# Patient Record
Sex: Female | Born: 1955
Health system: Southern US, Community
[De-identification: ages and names within clinical notes are randomized; demographics above are authoritative.]

## PROBLEM LIST (undated history)

## (undated) ENCOUNTER — Emergency Department (HOSPITAL_COMMUNITY): Admission: EM | Payer: Medicare HMO | Source: Home / Self Care

## (undated) DIAGNOSIS — M542 Cervicalgia: Secondary | ICD-10-CM

## (undated) DIAGNOSIS — H269 Unspecified cataract: Secondary | ICD-10-CM

## (undated) DIAGNOSIS — B1910 Unspecified viral hepatitis B without hepatic coma: Secondary | ICD-10-CM

## (undated) DIAGNOSIS — R9341 Abnormal radiologic findings on diagnostic imaging of renal pelvis, ureter, or bladder: Secondary | ICD-10-CM

## (undated) DIAGNOSIS — G629 Polyneuropathy, unspecified: Secondary | ICD-10-CM

## (undated) DIAGNOSIS — M545 Low back pain, unspecified: Secondary | ICD-10-CM

## (undated) DIAGNOSIS — K219 Gastro-esophageal reflux disease without esophagitis: Secondary | ICD-10-CM

## (undated) DIAGNOSIS — F1911 Other psychoactive substance abuse, in remission: Secondary | ICD-10-CM

## (undated) DIAGNOSIS — J309 Allergic rhinitis, unspecified: Secondary | ICD-10-CM

## (undated) DIAGNOSIS — F419 Anxiety disorder, unspecified: Secondary | ICD-10-CM

## (undated) DIAGNOSIS — E119 Type 2 diabetes mellitus without complications: Secondary | ICD-10-CM

## (undated) DIAGNOSIS — E785 Hyperlipidemia, unspecified: Secondary | ICD-10-CM

## (undated) DIAGNOSIS — R569 Unspecified convulsions: Secondary | ICD-10-CM

## (undated) DIAGNOSIS — M797 Fibromyalgia: Secondary | ICD-10-CM

## (undated) DIAGNOSIS — M1990 Unspecified osteoarthritis, unspecified site: Secondary | ICD-10-CM

## (undated) DIAGNOSIS — F329 Major depressive disorder, single episode, unspecified: Secondary | ICD-10-CM

## (undated) DIAGNOSIS — D649 Anemia, unspecified: Secondary | ICD-10-CM

## (undated) DIAGNOSIS — T7840XA Allergy, unspecified, initial encounter: Secondary | ICD-10-CM

## (undated) DIAGNOSIS — F17200 Nicotine dependence, unspecified, uncomplicated: Secondary | ICD-10-CM

## (undated) DIAGNOSIS — F319 Bipolar disorder, unspecified: Secondary | ICD-10-CM

## (undated) DIAGNOSIS — F1910 Other psychoactive substance abuse, uncomplicated: Secondary | ICD-10-CM

## (undated) DIAGNOSIS — G43909 Migraine, unspecified, not intractable, without status migrainosus: Secondary | ICD-10-CM

## (undated) DIAGNOSIS — E109 Type 1 diabetes mellitus without complications: Secondary | ICD-10-CM

## (undated) HISTORY — DX: Unspecified cataract: H26.9

## (undated) HISTORY — DX: Abnormal radiologic findings on diagnostic imaging of renal pelvis, ureter, or bladder: R93.41

## (undated) HISTORY — DX: Allergic rhinitis, unspecified: J30.9

## (undated) HISTORY — DX: Type 2 diabetes mellitus without complications: E11.9

## (undated) HISTORY — DX: Low back pain, unspecified: M54.50

## (undated) HISTORY — DX: Allergy, unspecified, initial encounter: T78.40XA

## (undated) HISTORY — DX: Major depressive disorder, single episode, unspecified: F32.9

## (undated) HISTORY — DX: Polyneuropathy, unspecified: G62.9

## (undated) HISTORY — DX: Nicotine dependence, unspecified, uncomplicated: F17.200

## (undated) HISTORY — DX: Fibromyalgia: M79.7

## (undated) HISTORY — PX: TUBAL LIGATION: SHX77

## (undated) HISTORY — DX: Anemia, unspecified: D64.9

## (undated) HISTORY — DX: Other psychoactive substance abuse, in remission: F19.11

## (undated) HISTORY — DX: Unspecified osteoarthritis, unspecified site: M19.90

## (undated) HISTORY — DX: Unspecified convulsions: R56.9

## (undated) HISTORY — DX: Hyperlipidemia, unspecified: E78.5

## (undated) HISTORY — DX: Migraine, unspecified, not intractable, without status migrainosus: G43.909

## (undated) HISTORY — DX: Cervicalgia: M54.2

## (undated) HISTORY — PX: COLONOSCOPY: SHX174

## (undated) HISTORY — DX: Low back pain: M54.5

## (undated) HISTORY — DX: Bipolar disorder, unspecified: F31.9

## (undated) HISTORY — DX: Type 1 diabetes mellitus without complications: E10.9

## (undated) HISTORY — DX: Anxiety disorder, unspecified: F41.9

## (undated) HISTORY — DX: Gastro-esophageal reflux disease without esophagitis: K21.9

## (undated) HISTORY — PX: UPPER GASTROINTESTINAL ENDOSCOPY: SHX188

## (undated) HISTORY — DX: Other psychoactive substance abuse, uncomplicated: F19.10

## (undated) HISTORY — DX: Unspecified viral hepatitis B without hepatic coma: B19.10

---

## 1999-02-27 ENCOUNTER — Emergency Department (HOSPITAL_COMMUNITY): Admission: EM | Admit: 1999-02-27 | Discharge: 1999-02-27 | Payer: Self-pay | Admitting: Emergency Medicine

## 2000-05-02 ENCOUNTER — Other Ambulatory Visit: Admission: RE | Admit: 2000-05-02 | Discharge: 2000-05-02 | Payer: Self-pay | Admitting: Obstetrics and Gynecology

## 2000-10-15 ENCOUNTER — Emergency Department (HOSPITAL_COMMUNITY): Admission: EM | Admit: 2000-10-15 | Discharge: 2000-10-15 | Payer: Self-pay | Admitting: Emergency Medicine

## 2000-10-16 ENCOUNTER — Inpatient Hospital Stay (HOSPITAL_COMMUNITY): Admission: EM | Admit: 2000-10-16 | Discharge: 2000-10-21 | Payer: Self-pay | Admitting: *Deleted

## 2000-11-01 ENCOUNTER — Emergency Department (HOSPITAL_COMMUNITY): Admission: EM | Admit: 2000-11-01 | Discharge: 2000-11-01 | Payer: Self-pay | Admitting: Emergency Medicine

## 2001-08-21 ENCOUNTER — Emergency Department (HOSPITAL_COMMUNITY): Admission: EM | Admit: 2001-08-21 | Discharge: 2001-08-21 | Payer: Self-pay | Admitting: *Deleted

## 2002-04-29 ENCOUNTER — Encounter: Payer: Self-pay | Admitting: Endocrinology

## 2002-04-29 ENCOUNTER — Ambulatory Visit (HOSPITAL_COMMUNITY): Admission: RE | Admit: 2002-04-29 | Discharge: 2002-04-29 | Payer: Self-pay | Admitting: Endocrinology

## 2002-09-21 ENCOUNTER — Emergency Department (HOSPITAL_COMMUNITY): Admission: EM | Admit: 2002-09-21 | Discharge: 2002-09-21 | Payer: Self-pay | Admitting: Emergency Medicine

## 2002-09-25 ENCOUNTER — Inpatient Hospital Stay (HOSPITAL_COMMUNITY): Admission: EM | Admit: 2002-09-25 | Discharge: 2002-09-29 | Payer: Self-pay | Admitting: Psychiatry

## 2004-05-11 ENCOUNTER — Emergency Department (HOSPITAL_COMMUNITY): Admission: EM | Admit: 2004-05-11 | Discharge: 2004-05-11 | Payer: Self-pay | Admitting: Emergency Medicine

## 2006-02-08 ENCOUNTER — Emergency Department (HOSPITAL_COMMUNITY): Admission: EM | Admit: 2006-02-08 | Discharge: 2006-02-08 | Payer: Self-pay | Admitting: Family Medicine

## 2007-07-16 ENCOUNTER — Emergency Department (HOSPITAL_COMMUNITY): Admission: EM | Admit: 2007-07-16 | Discharge: 2007-07-16 | Payer: Self-pay | Admitting: Emergency Medicine

## 2009-01-12 ENCOUNTER — Encounter: Admission: RE | Admit: 2009-01-12 | Discharge: 2009-01-12 | Payer: Self-pay | Admitting: Psychiatry

## 2009-05-26 LAB — HM COLONOSCOPY: HM Colonoscopy: NORMAL

## 2010-01-30 ENCOUNTER — Emergency Department (HOSPITAL_COMMUNITY): Admission: EM | Admit: 2010-01-30 | Discharge: 2010-01-31 | Payer: Self-pay | Admitting: Emergency Medicine

## 2010-01-31 DIAGNOSIS — E162 Hypoglycemia, unspecified: Secondary | ICD-10-CM

## 2010-01-31 LAB — CONVERTED CEMR LAB
BUN: 9 mg/dL
Chloride: 109 meq/L
Creatinine, Ser: 0.8 mg/dL
HCT: 39 %
Potassium: 3.6 meq/L

## 2010-07-05 ENCOUNTER — Ambulatory Visit
Admission: RE | Admit: 2010-07-05 | Discharge: 2010-07-05 | Payer: Self-pay | Source: Home / Self Care | Attending: Internal Medicine | Admitting: Internal Medicine

## 2010-07-05 DIAGNOSIS — B1910 Unspecified viral hepatitis B without hepatic coma: Secondary | ICD-10-CM

## 2010-07-05 DIAGNOSIS — M542 Cervicalgia: Secondary | ICD-10-CM | POA: Insufficient documentation

## 2010-07-05 DIAGNOSIS — M545 Low back pain, unspecified: Secondary | ICD-10-CM

## 2010-07-05 DIAGNOSIS — E1069 Type 1 diabetes mellitus with other specified complication: Secondary | ICD-10-CM

## 2010-07-05 DIAGNOSIS — F319 Bipolar disorder, unspecified: Secondary | ICD-10-CM | POA: Insufficient documentation

## 2010-07-05 DIAGNOSIS — J410 Simple chronic bronchitis: Secondary | ICD-10-CM | POA: Insufficient documentation

## 2010-07-05 DIAGNOSIS — K219 Gastro-esophageal reflux disease without esophagitis: Secondary | ICD-10-CM

## 2010-07-05 DIAGNOSIS — E782 Mixed hyperlipidemia: Secondary | ICD-10-CM

## 2010-07-05 DIAGNOSIS — G43909 Migraine, unspecified, not intractable, without status migrainosus: Secondary | ICD-10-CM | POA: Insufficient documentation

## 2010-07-05 DIAGNOSIS — F329 Major depressive disorder, single episode, unspecified: Secondary | ICD-10-CM | POA: Insufficient documentation

## 2010-07-05 DIAGNOSIS — F17200 Nicotine dependence, unspecified, uncomplicated: Secondary | ICD-10-CM | POA: Insufficient documentation

## 2010-07-05 DIAGNOSIS — J309 Allergic rhinitis, unspecified: Secondary | ICD-10-CM | POA: Insufficient documentation

## 2010-07-05 DIAGNOSIS — E119 Type 2 diabetes mellitus without complications: Secondary | ICD-10-CM | POA: Insufficient documentation

## 2010-07-05 HISTORY — DX: Unspecified viral hepatitis B without hepatic coma: B19.10

## 2010-07-05 HISTORY — DX: Low back pain, unspecified: M54.50

## 2010-07-05 HISTORY — DX: Bipolar disorder, unspecified: F31.9

## 2010-07-05 HISTORY — DX: Type 1 diabetes mellitus with other specified complication: E10.69

## 2010-07-05 HISTORY — DX: Simple chronic bronchitis: J41.0

## 2010-07-05 HISTORY — DX: Migraine, unspecified, not intractable, without status migrainosus: G43.909

## 2010-07-05 HISTORY — DX: Gastro-esophageal reflux disease without esophagitis: K21.9

## 2010-08-04 NOTE — Assessment & Plan Note (Signed)
Summary: NEW MEDICARE PT--#--PKG---STC   Vital Signs:  Patient profile:   55 year old female Height:      64.5 inches (163.83 cm) Weight:      141.2 pounds (64.18 kg) BMI:     23.95 O2 Sat:      96 % on Room air Temp:     98.8 degrees F (37.11 degrees C) oral Pulse rate:   64 / minute BP sitting:   110 / 60  (left arm) Cuff size:   regular  Vitals Entered By: Orlan Leavens RMA (July 05, 2010 9:41 AM)  O2 Flow:  Room air CC: New patient Is Patient Diabetic? No Pain Assessment Patient in pain? no      CBG Result 32 CBG Device ID Pt mom check this am   Primary Care Provider:  Newt Lukes MD  CC:  New patient.  History of Present Illness: new pt to me and our practice, here to est care  1) DM2 - insulin dep - follows with endo (kumar) for same - denies hx hypoglycemic signs or symptoms since summer 2011 (ER visit for same reviewed) - checks sugars 2-3x/d  2) depression, severe - hx - follows with psyc and mental health for same - reports compliance with ongoing medical treatment and no changes in medication dose or frequency. denies adverse side effects related to current therapy. no SI/HI  3) migraines - follows with [facility] for same ([facility]) -   4) dyslipidemia - on statin for same since - reports compliance with ongoing medical treatment and no changes in medication dose or frequency. denies adverse side effects related to current therapy.   c/o consant sedation, ?med related  c/o neck and back pain - chronic - aches to sleep and do nothing but worse with any movement ?FM - mom has same no injury or trauma hx onset symptoms >2 years ago no radiation of pain or numbness - not relieved with current meds  Preventive Screening-Counseling & Management  Alcohol-Tobacco     Alcohol drinks/day: <1     Alcohol Counseling: not indicated; use of alcohol is not excessive or problematic     Smoking Status: current     Tobacco Counseling: to quit use of tobacco  products  Caffeine-Diet-Exercise     Does Patient Exercise: yes     Exercise Counseling: to improve exercise regimen     Depression Counseling: not indicated; screening negative for depression  Safety-Violence-Falls     Seat Belt Counseling: not indicated; patient wears seat belts     Helmet Counseling: not indicated; patient wears helmet when riding bicycle/motocycle     Firearm Counseling: not applicable     Smoke Detector Counseling: no     Violence Counseling: not applicable     Fall Risk Counseling: not indicated; no significant falls noted  Clinical Review Panels:  Diabetes Management   Creatinine:  0.8 (01/31/2010)  CBC   Hgb:  13.3 (01/31/2010)   Hct:  39.0 (01/31/2010)  Complete Metabolic Panel   Glucose:  172 (01/31/2010)   Sodium:  134 (01/31/2010)   Potassium:  3.6 (01/31/2010)   Chloride:  109 (01/31/2010)   CO2:  17 (01/31/2010)   BUN:  9 (01/31/2010)   Creatinine:  0.8 (01/31/2010)   -  Date:  01/31/2010    BG Random: 172    BUN: 9    Creatinine: 0.8    Sodium: 134    Potassium: 3.6    Chloride: 109  CO2 Total: 17    HGB: 13.3    HCT: 39.0  Current Medications (verified): 1)  Topiramate 200 Mg Tabs (Topiramate) .... Take 1 By Mouth Once Daily 2)  Trazodone Hcl 50 Mg Tabs (Trazodone Hcl) .... Take 1 At Bedtime 3)  Lamotrigine 25 Mg Tabs (Lamotrigine) .... Take 2 At Bedtime 4)  Simvastatin 20 Mg Tabs (Simvastatin) .... Take 1 By Mouth Once Daily 5)  Bethanechol Chloride 25 Mg Tabs (Bethanechol Chloride) .... Take 1 Two Times A Day 6)  Lexapro 20 Mg Tabs (Escitalopram Oxalate) .... Take 1 1/2 By Mouth Once Daily 7)  Baclofen 10 Mg Tabs (Baclofen) .... Take 1 Three Times A Day As Needed 8)  Nexium 40 Mg Cpdr (Esomeprazole Magnesium) .... Take 1 By Mouth Once Daily 9)  Atenolol 25 Mg Tabs (Atenolol) .... Take 1 By Mouth Once Daily 10)  Gabapentin 100 Mg Caps (Gabapentin) .... Take 2 Three Times A Day 11)  Humalog 100 Unit/ml Soln (Insulin Lispro  (Human)) .... Sliding Scale 12)  Lantus 100 Unit/ml Soln (Insulin Glargine) .... Take 9 Units in Morning, and 5-6 Units At Supper  Allergies (verified): 1)  ! Sulfa 2)  ! Penicillin  Past History:  Past Medical History: Allergic rhinitis Depression/bipolar Hyperlipidemia Diabetes mellitus, type II -insulin dep migraines chronic pain - hx narc abuse GERD  MD roster: endo - k[facility] neuro - [facility] ([facility]) psyc - reddy - mental health  Past Surgical History: Denies surgical history  Family History: Family History of Arthritis (parent) Family History Diabetes 1st degree relative (parent) Family History High cholesterol (parent) Kidney disease (father & aunt)  Social History: Current Smoker- plans to quit 08/2010 divorced, lives with her parents but has boyfriend disabled Smoking Status:  current Does Patient Exercise:  yes  Review of Systems       see HPI above. I have reviewed all other systems and they were negative.   Physical Exam  General:  alert, well-developed, well-nourished, and cooperative to examination.   mom at side Head:  Normocephalic and atraumatic without obvious abnormalities. No apparent alopecia or balding. Eyes:  vision grossly intact; pupils equal, round and reactive to light.  conjunctiva and lids normal.    Ears:  normal pinnae bilaterally, without erythema, swelling, or tenderness to palpation. TMs clear, without effusion, or cerumen impaction. Hearing grossly normal bilaterally  Mouth:  teeth and gums in good repair; mucous membranes moist, without lesions or ulcers. oropharynx clear without exudate, no erythema.  Neck:  supple, full ROM, no masses, no thyromegaly; no thyroid nodules or tenderness. no JVD or carotid bruits.   Lungs:  normal respiratory effort, no intercostal retractions or use of accessory muscles; normal breath sounds bilaterally - no crackles and no wheezes.    Heart:  normal rate, regular rhythm, no murmur, and no rub. BLE  without edema.  Msk:  back: full range of motion of lumbar spine. Nontender to palpation. Negative straight leg raise. Deep tendon reflexes symmetrically intact at Achilles and patella, negative clonus. Sensation intact throughout all dermatomes in bilateral lower extremities. Full strength to manual muscle testing in all major muscule groups including EHL, anterior tibialis, gastrocnemius, quadriceps, and iliopsoas. Able to heel and toe walk without difficulty and ambulates with a normal gait.  Neurologic:  alert & oriented X3 and cranial nerves II-XII symetrically intact.  strength normal in all extremities, sensation intact to light touch, and gait normal. speech fluent without dysarthria or aphasia; follows commands with good comprehension.  Psych:  Oriented X3, memory intact for recent and remote, normally interactive, good eye contact, min anxious appearing, min depressed appearing, and not agitated.      Impression & Recommendations:  Problem # 1:  LOW BACK PAIN, CHRONIC (ICD-724.2) no injury recalled -  ?DDD, strain or "FM" - check xray now and send for prior records from PCP to review - pt reports "all normal" already on muscle relaxant for migraines - ?change to robaxin for less sedation than baclofen at future visit consider tramadol but avoid narc given hx abuse and med sensitivity (sedation) Her updated medication list for this problem includes:    Baclofen 10 Mg Tabs (Baclofen) .Marland Kitchen... Take 1 three times a day as needed    Tramadol Hcl 50 Mg Tabs (Tramadol hcl) .Marland Kitchen... 1 by mouth two times a day as needed for pain  Orders: T-Lumbar Spine w/Flex & Ext 4 Views [id number]) Prescription Created Electronically [phone])  Problem # 2:  BIPOLAR AFFECTIVE DISORDER (ICD-296.80) psyc records in hosp emr reviewed - consider change lexapro to savella if pain syndrome appears related to FM -  cont trazadone for sleep and follow with mental health as ongoing  Problem # 3:  NECK PAIN, CHRONIC  (ICD-723.1) no neuro deficits - check xray r/o DDD and consider med for poss FM if reviewed labs normal (see LBP above) Her updated medication list for this problem includes:    Baclofen 10 Mg Tabs (Baclofen) .Marland Kitchen... Take 1 three times a day as needed    Tramadol Hcl 50 Mg Tabs (Tramadol hcl) .Marland Kitchen... 1 by mouth two times a day as needed for pain  Orders: T-Cervical Spine Comp 4 Views [phone]) Prescription Created Electronically [phone])  Problem # 4:  DIABETES MELLITUS, TYPE II (ICD-250.00)  follows with endo for same - send for records - no changes Her updated medication list for this problem includes:    Humalog 100 Unit/ml Soln (Insulin lispro (human)) ..... Sliding scale    Lantus 100 Unit/ml Soln (Insulin glargine) .Marland Kitchen... Take 9 units in morning, and 5-6 units at supper  Labs Reviewed: Creat: 0.8 (01/31/2010)     Orders: Tobacco use cessation intermediate 3-10 minutes (99406)  Problem # 5:  HYPERLIPIDEMIA (ICD-272.4) has been managed by endo - send for records - no med change for now Her updated medication list for this problem includes:    Simvastatin 20 Mg Tabs (Simvastatin) .Marland Kitchen... Take 1 by mouth once daily  Problem # 6:  MIGRAINE HEADACHE (ICD-346.90) follows ith [facility] for same - on topomax, gabapentin, lamictal and baclofen - also Bbloc with atenolol reduce gabapent as neffectve per pt - add tramadol as for other pain syndrome symptoms  Her updated medication list for this problem includes:    Atenolol 25 Mg Tabs (Atenolol) .Marland Kitchen... Take 1 by mouth once daily    Tramadol Hcl 50 Mg Tabs (Tramadol hcl) .Marland Kitchen... 1 by mouth two times a day as needed for pain  Problem # 7:  GERD (ICD-530.81)  Her updated medication list for this problem includes:    Nexium 40 Mg Cpdr (Esomeprazole magnesium) .Marland Kitchen... Take 1 by mouth once daily  Labs Reviewed: Hgb: 13.3 (01/31/2010)   Hct: 39.0 (01/31/2010)  Problem # 8:  SMOKER (ICD-305.1)  5 minutes today spent on patient education  regarding the unhealthy effects of continued tobacco abuse and encouragment of cessation including medical options available to help patient to quit smoking.   Orders: Tobacco use cessation intermediate 3-10 minutes (21308)  Complete Medication List: 1)  Topiramate 200 Mg Tabs (Topiramate) .... Take 1 by mouth once daily 2)  Trazodone Hcl 50 Mg Tabs (Trazodone hcl) .... Take 1 at bedtime 3)  Lamotrigine 25 Mg Tabs (Lamotrigine) .... Take 2 at bedtime 4)  Simvastatin 20 Mg Tabs (Simvastatin) .... Take 1 by mouth once daily 5)  Bethanechol Chloride 25 Mg Tabs (Bethanechol chloride) .... Take 1 two times a day 6)  Lexapro 20 Mg Tabs (Escitalopram oxalate) .... Take 1 1/2 by mouth once daily 7)  Baclofen 10 Mg Tabs (Baclofen) .... Take 1 three times a day as needed 8)  Nexium 40 Mg Cpdr (Esomeprazole magnesium) .... Take 1 by mouth once daily 9)  Atenolol 25 Mg Tabs (Atenolol) .... Take 1 by mouth once daily 10)  Gabapentin 100 Mg Caps (Gabapentin) .... Take 1 three times a day 11)  Humalog 100 Unit/ml Soln (Insulin lispro (human)) .... Sliding scale 12)  Lantus 100 Unit/ml Soln (Insulin glargine) .... Take 9 units in morning, and 5-6 units at supper 13)  Tramadol Hcl 50 Mg Tabs (Tramadol hcl) .Marland Kitchen.. 1 by mouth two times a day as needed for pain  Patient Instructions: 1)  it was good to see you today. 2)  medications reviewed - reduce gabapentin to 1 three times a day - no other changes at this time 3)  xrays of neck and back ordered today - your results will be called to you after review in 48-72 hours from the time of test completion; if any medicine changes need to be made or there are abnormal results, you will be notified at that time 4)  will send for records from dr. Lucianne Muss as dicsussed for review 5)  Please schedule a follow-up appointment in 6 weeks to continue review of pain symptoms and medications, call sooner if problems.    Orders Added: 1)  T-Lumbar Spine w/Flex & Ext 4 Views  [72120TC] 2)  T-Cervical Spine Comp 4 Views [72050TC] 3)  New Patient Level IV [16109] 4)  Prescription Created Electronically [[id number]] 5)  Tobacco use cessation intermediate 3-10 minutes [99406]

## 2010-08-17 ENCOUNTER — Encounter: Payer: Self-pay | Admitting: Internal Medicine

## 2010-08-17 ENCOUNTER — Ambulatory Visit (INDEPENDENT_AMBULATORY_CARE_PROVIDER_SITE_OTHER): Payer: Medicare HMO | Admitting: Internal Medicine

## 2010-08-17 DIAGNOSIS — M545 Low back pain: Secondary | ICD-10-CM

## 2010-08-17 DIAGNOSIS — E119 Type 2 diabetes mellitus without complications: Secondary | ICD-10-CM

## 2010-08-17 DIAGNOSIS — F17200 Nicotine dependence, unspecified, uncomplicated: Secondary | ICD-10-CM

## 2010-08-17 DIAGNOSIS — M542 Cervicalgia: Secondary | ICD-10-CM

## 2010-08-24 NOTE — Assessment & Plan Note (Signed)
Summary: 6 WK NWS #   Vital Signs:  Patient profile:   55 year old female Weight:      140.0 pounds (63.64 kg) O2 Sat:      97 % on Room air Temp:     97.7 degrees F (36.50 degrees C) oral Pulse rate:   62 / minute BP sitting:   112 / 62  (left arm) Cuff size:   regular  Vitals Entered By: Orlan Leavens RMA (August 17, 2010 10:41 AM)  O2 Flow:  Room air CC: 6 week follow-up Is Patient Diabetic? No Pain Assessment Patient in pain? no        Primary Care Provider:  Newt Lukes MD  CC:  6 week follow-up.  History of Present Illness: here for f/u c/o neck and back pain - chronic -  aches to sleep and aches do nothing- pain worse with any movement ?FM - mom has same no injury or trauma hx onset symptoms >2 years ago no radiation of pain or numbness - xray c and l spine 07/2010 - mild DDD - reviewed with pt/mom now on tramadol and reduced gabapentin - less sedate but feels constipated -  also reviewed chronic med issues: 1) DM2 - insulin dep - follows with endo (kumar) for same - denies hx hypoglycemic signs or symptoms since summer 2011 (ER visit for same reviewed) - checks sugars 2-3x/d  2) depression, severe - hx - follows with psyc and mental health for same - reports compliance with ongoing medical treatment and no changes in medication dose or frequency. denies adverse side effects related to current therapy. no SI/HI  3) migraines - [facility] for same ([facility]) -   4) dyslipidemia - on statin for same since - reports compliance with ongoing medical treatment and no changes in medication dose or frequency. denies adverse side effects related to current therapy.    Preventive Screening-Counseling & Management  Alcohol-Tobacco     Smoking Cessation Counseling: yes  Current Medications (verified): 1)  Topiramate 200 Mg Tabs (Topiramate) .... Take 1 By Mouth Once Daily 2)  Trazodone Hcl 50 Mg Tabs (Trazodone Hcl) .... Take 1 At Bedtime 3)   Lamotrigine 25 Mg Tabs (Lamotrigine) .... Take 2 At Bedtime 4)  Simvastatin 20 Mg Tabs (Simvastatin) .... Take 1 By Mouth Once Daily 5)  Bethanechol Chloride 25 Mg Tabs (Bethanechol Chloride) .... Take 1 Two Times A Day 6)  Lexapro 20 Mg Tabs (Escitalopram Oxalate) .... Take 1 1/2 By Mouth Once Daily 7)  Baclofen 10 Mg Tabs (Baclofen) .... Take 1 Three Times A Day As Needed 8)  Nexium 40 Mg Cpdr (Esomeprazole Magnesium) .... Take 1 By Mouth Once Daily 9)  Atenolol 25 Mg Tabs (Atenolol) .... Take 1 By Mouth Once Daily 10)  Gabapentin 100 Mg Caps (Gabapentin) .... Take 1 Three Times A Day 11)  Humalog 100 Unit/ml Soln (Insulin Lispro (Human)) .... Sliding Scale 12)  Lantus 100 Unit/ml Soln (Insulin Glargine) .... Take 9 Units in Morning, and 5-6 Units At Supper 13)  Tramadol Hcl 50 Mg Tabs (Tramadol Hcl) .Marland Kitchen.. 1 By Mouth Two Times A Day As Needed For Pain  Allergies (verified): 1)  ! Sulfa 2)  ! Penicillin  Past History:  Past Medical History: Allergic rhinitis  Depression/bipolar Hyperlipidemia Diabetes mellitus, type II -insulin dep migraines chronic pain - hx narc abuse GERD  MD roster: endo - k[facility] neuro - [facility] ([facility]) psyc - reddy - mental health  Social History: Current Smoker- plans to quit?? divorced, lives with her parents but has boyfriend disabled  Review of Systems  The patient denies chest pain, syncope, hemoptysis, abdominal pain, and severe indigestion/heartburn.    Physical Exam  General:  alert, well-developed, well-nourished, and cooperative to examination.   mom at side Lungs:  normal respiratory effort, no intercostal retractions or use of accessory muscles; normal breath sounds bilaterally - no crackles and no wheezes.    Heart:  normal rate, regular rhythm, no murmur, and no rub. BLE without edema.  Psych:  Oriented X3, memory intact for recent and remote, normally interactive, good eye contact, min anxious appearing, min depressed appearing,  and not agitated.      Impression & Recommendations:  Problem # 1:  NECK PAIN, CHRONIC (ICD-723.1)  Her updated medication list for this problem includes:    Baclofen 10 Mg Tabs (Baclofen) .Marland Kitchen... Take 1 three times a day as needed    Tramadol Hcl 50 Mg Tabs (Tramadol hcl) .Marland Kitchen... 1 by mouth two times a day as needed for pain    Meloxicam 15 Mg Tabs (Meloxicam) .Marland Kitchen... 1 by mouth once daily as needed for pain symptoms  no neuro deficits - 07/2010 xray with mild DDD  (see LBP next) add meloxicam for daily pain relief - erx done  Orders: Prescription Created Electronically [phone])  Problem # 2:  LOW BACK PAIN, CHRONIC (ICD-724.2)  Her updated medication list for this problem includes:    Baclofen 10 Mg Tabs (Baclofen) .Marland Kitchen... Take 1 three times a day as needed    Tramadol Hcl 50 Mg Tabs (Tramadol hcl) .Marland Kitchen... 1 by mouth two times a day as needed for pain    Meloxicam 15 Mg Tabs (Meloxicam) .Marland Kitchen... 1 by mouth once daily as needed for pain symptoms  ?DDD, strain or "FM" - 07/2010 xray with mild ddd  already on muscle relaxant for migraines - ?change to robaxin for less sedation than baclofen at future visit use tramadol but causes constipation - also avoid narc given hx abuse and med sensitivity (sedation) new erx for meloxicam as above  Orders: Prescription Created Electronically [id number])  Problem # 3:  SMOKER (ICD-305.1)  5 minutes today spent on patient education regarding the unhealthy effects of continued tobacco abuse and encouragment of cessation including medical options available to help patient to quit smoking.   Orders: Tobacco use cessation intermediate 3-10 minutes (09811)  Problem # 4:  DIABETES MELLITUS, TYPE II (ICD-250.00)  Her updated medication list for this problem includes:    Humalog 100 Unit/ml Soln (Insulin lispro (human)) ..... Sliding scale    Lantus 100 Unit/ml Soln (Insulin glargine) .Marland Kitchen... Take 9 units in morning, and 5-6 units at supper  follows with endo for  same -   Orders: Tobacco use cessation intermediate 3-10 minutes (99406)  Complete Medication List: 1)  Topiramate 200 Mg Tabs (Topiramate) .... Take 1 by mouth once daily 2)  Trazodone Hcl 50 Mg Tabs (Trazodone hcl) .... Take 1 at bedtime 3)  Lamotrigine 25 Mg Tabs (Lamotrigine) .... Take 2 at bedtime 4)  Simvastatin 20 Mg Tabs (Simvastatin) .... Take 1 by mouth once daily 5)  Bethanechol Chloride 25 Mg Tabs (Bethanechol chloride) .... Take 1 two times a day 6)  Lexapro 20 Mg Tabs (Escitalopram oxalate) .... Take 1 1/2 by mouth once daily 7)  Baclofen 10 Mg Tabs (Baclofen) .... Take 1 three times a day as needed 8)  Nexium 40 Mg Cpdr (Esomeprazole magnesium) .... Take  1 by mouth once daily 9)  Atenolol 25 Mg Tabs (Atenolol) .... Take 1 by mouth once daily 10)  Gabapentin 100 Mg Caps (Gabapentin) .... Take 1 three times a day 11)  Humalog 100 Unit/ml Soln (Insulin lispro (human)) .... Sliding scale 12)  Lantus 100 Unit/ml Soln (Insulin glargine) .... Take 9 units in morning, and 5-6 units at supper 13)  Tramadol Hcl 50 Mg Tabs (Tramadol hcl) .Marland Kitchen.. 1 by mouth two times a day as needed for pain 14)  Meloxicam 15 Mg Tabs (Meloxicam) .Marland Kitchen.. 1 by mouth once daily as needed for pain symptoms  Patient Instructions: 1)  it was good to see you today. 2)  medications reviewed - use meloxicam daily for pain - no other changes at this time - ok to still use tramadol as needed if bad pain day 3)  your prescription has been electronically submitted to your pharmacy. Please take as directed. Contact our office if you believe you're having problems with the medication(s).  4)  continue to follow with dr. Lucianne Muss for diabetes as ongoing 5)  Please schedule a follow-up appointment in 3 months to continue review of pain symptoms and medications, call sooner if problems.  6)  Tobacco is very bad for your health and your loved ones! You Should stop smoking! Prescriptions: MELOXICAM 15 MG TABS (MELOXICAM) 1 by  mouth once daily as needed for pain symptoms  #30 x 3   Entered and Authorized by:   Newt Lukes MD   Signed by:   Newt Lukes MD on 08/17/2010   Method used:   Electronically to        [facility] [phone]* (retail)       [address]       [address]       [place], [place]  [address]       Ph: [phone]       Fax: [phone]   RxID:   [id number]    Orders Added: 1)  Est. Patient Level IV [62694] 2)  Prescription Created Electronically [[id number]] 3)  Tobacco use cessation intermediate 3-10 minutes [70350]

## 2010-09-16 LAB — POCT I-STAT, CHEM 8
BUN: 9 mg/dL (ref 6–23)
Calcium, Ion: 0.9 mmol/L — ABNORMAL LOW (ref 1.12–1.32)
Creatinine, Ser: 0.8 mg/dL (ref 0.4–1.2)
TCO2: 17 mmol/L (ref 0–100)

## 2010-11-11 ENCOUNTER — Encounter: Payer: Self-pay | Admitting: Internal Medicine

## 2010-11-16 ENCOUNTER — Other Ambulatory Visit (INDEPENDENT_AMBULATORY_CARE_PROVIDER_SITE_OTHER): Payer: Medicare HMO

## 2010-11-16 ENCOUNTER — Ambulatory Visit (INDEPENDENT_AMBULATORY_CARE_PROVIDER_SITE_OTHER): Payer: Medicare HMO | Admitting: Internal Medicine

## 2010-11-16 ENCOUNTER — Encounter: Payer: Self-pay | Admitting: Internal Medicine

## 2010-11-16 DIAGNOSIS — E785 Hyperlipidemia, unspecified: Secondary | ICD-10-CM

## 2010-11-16 DIAGNOSIS — E119 Type 2 diabetes mellitus without complications: Secondary | ICD-10-CM

## 2010-11-16 DIAGNOSIS — M542 Cervicalgia: Secondary | ICD-10-CM

## 2010-11-16 LAB — LIPID PANEL
HDL: 75.2 mg/dL (ref >39.00)
LDL Cholesterol: 104 mg/dL — ABNORMAL HIGH (ref 0–99)
Total CHOL/HDL Ratio: 3
Triglycerides: 57 mg/dL (ref 0.0–149.0)

## 2010-11-16 NOTE — Assessment & Plan Note (Signed)
   suspect DDD with strain > "FM" - 07/2010 xray with mild ddd  already on muscle relaxant for migraines -  Consider change to robaxin for less sedation than baclofen at future visit if needed use tramadol but causes constipation - also avoid narc given hx abuse and med sensitivity (sedation) Also continue meloxicam as needed

## 2010-11-16 NOTE — Assessment & Plan Note (Signed)
Follows with endo for same - lantus and humalog ssi - will check a1c now for pt to take to endo next week The current medical regimen is effective;  continue present plan and medications.

## 2010-11-16 NOTE — Patient Instructions (Signed)
It was good to see you today. Test(s) ordered today. Your results will be called to you after review (48-72hours after test completion). If any changes need to be made, you will be notified at that time. Medications reviewed, no changes at this time. Be careful around that puppy!  Please schedule followup in 4 months, call sooner if problems.

## 2010-11-16 NOTE — Progress Notes (Signed)
  Subjective:    Patient ID: Leslie Lane, female    DOB: 30-Dec-1955, 55 y.o.   MRN: [id number]  HPI  Here for follow up - reviewed chronic medical issues today:  neck and back pain - chronic -  aches to sleep and aches do nothing- pain worse with any movement ?FM - mom has same no injury or trauma hx onset symptoms >2 years ago no radiation of pain or numbness - xray c and l spine 07/2010 - mild DDD - reviewed with pt/mom now on tramadol and reduced gabapentin - less sedate but feels constipated -  DM2 - insulin dep - follows with endo (kumar) for same - denies hx hypoglycemic signs or symptoms since summer 2011 (ER visit for same reviewed) - checks sugars 2-3x/d  depression, severe - hx - follows with psyc and mental health for same - reports compliance with ongoing medical treatment and no changes in medication dose or frequency. denies adverse side effects related to current therapy. no SI/HI  migraines - follows with [facility] for same ([facility]) - no change in meds  dyslipidemia - on statin for same since - reports compliance with ongoing medical treatment and no changes in medication dose or frequency. denies adverse side effects related to current therapy.   Past Medical History  Diagnosis Date  . HEPATITIS B   . BIPOLAR AFFECTIVE DISORDER   . SMOKER   . MIGRAINE HEADACHE   . NECK PAIN, CHRONIC   . LOW BACK PAIN, CHRONIC   . ALLERGIC RHINITIS   . HYPERLIPIDEMIA   . GERD   . DIABETES MELLITUS, TYPE II   . DEPRESSION   . NARCOTIC ABUSE     Review of Systems  Constitutional: Negative for fatigue.  Respiratory: Negative for shortness of breath.   Cardiovascular: Negative for chest pain.  Neurological: Negative for syncope and headaches.       Objective:   Physical Exam BP 96/58  Pulse 59  Temp(Src) 98.3 F (36.8 C) (Oral)  Resp 14  Wt 136 lb (61.689 kg)  SpO2 97% Physical Exam  Constitutional: She is oriented to person, place, and time. She appears  well-developed and well-nourished. No distress.  Neck: Normal range of motion. Neck supple. No JVD present. No thyromegaly present.  Cardiovascular: Normal rate, regular rhythm and normal heart sounds.  No murmur heard. No BLE edema. Pulmonary/Chest: Effort normal and breath sounds normal. No respiratory distress. She has no wheezes.  Neurological: She is alert and oriented to person, place, and time. No cranial nerve deficit. Coordination normal.  Skin: Skin is warm and dry. No rash noted. Superficial abrasions and puppy teeth scratches on BUE. No erythema or bruising.  Psychiatric: She has a normal mood and affect. Her behavior is normal. Judgment and thought content normal.   Lab Results  Component Value Date   HGB 13.3 01/31/2010   HCT 39.0 01/31/2010   NA 134* 01/31/2010   K 3.6 01/31/2010   CL 109 01/31/2010   CREATININE 0.8 01/31/2010   BUN 9 01/31/2010   CO2 17 01/31/2010        Assessment & Plan:  See problem list. Medications and labs reviewed today.

## 2010-11-16 NOTE — Assessment & Plan Note (Signed)
On statin - The current medical regimen is effective;  continue present plan and medications.  Check lipids now 

## 2010-11-18 NOTE — Discharge Summary (Signed)
Leslie Lane, DESANTIAGO                          ACCOUNT NO.:  [id number]   MEDICAL RECORD NO.:  [id number]                   PATIENT TYPE:  IPS   LOCATION:  0306                                 FACILITY:  [facility]   PHYSICIAN:  Jeanice Lim, M.D.              DATE OF BIRTH:  12-27-1955   DATE OF ADMISSION:  09/25/2002  DATE OF DISCHARGE:  09/29/2002                                 DISCHARGE SUMMARY   IDENTIFYING DATA:  This is a 55 year old divorced Caucasian female  involuntarily committed with a history of bipolar disorder.   MEDICATIONS:  Hydrocodone, aspirin, Lexapro.  The patient has been partially  noncompliant with medications.   ALLERGIES:  SULFA drugs and PENICILLIN.   PHYSICAL EXAMINATION:  Essentially within normal limits.  Neurologically  nonfocal.   LABORATORY DATA:  Alcohol level 225.  CMET within normal limits.  Urine drug  screen reportedly positive for benzodiazepines, cocaine and opiates.   MENTAL STATUS EXAM:  The patient is a tired-appearing female.  Cooperative.  Fair eye contact.  Speech clear.  Mood depressed.  Affect restricted,  somewhat irritable at times.  Thought processes goal directed.  Thought  content negative for dangerous ideation or psychotic symptoms.  Cognitively  intact.  Judgment and insight are poor.  Poor impulse control.   ADMISSION DIAGNOSES:   AXIS I:  1. Bipolar disorder.  2. Possible alcohol dependence.  3. Opiate and cocaine abuse.   AXIS II:  None.   AXIS III:  Insulin-dependent diabetes mellitus.   AXIS IV:  Moderate (problems with primary support group and other  psychosocial stressors).   AXIS V:  25/60.   HOSPITAL COURSE:  The patient was admitted and ordered routine p.r.n.  medications and underwent further monitoring.  Was encouraged to participate  in individual, group and milieu therapy.  The patient reported suicidal  ideation with plan with wanting to die.  Feels that her nerves and mood have  never been the  same since she had diabetic coma.  Had been off of  psychotropics for 2-3 months and began drinking and complaining of anxiety,  irritability and history of a positive response to Lexapro and Xanax.  The  patient was rapidly stabilized on medications.  Reported a positive response  to clinical intervention.   CONDITION ON DISCHARGE:  Markedly improved.  Mood was more euthymic.  Affect  brighter.  Thought processes goal directed.  Thought content negative for  dangerous ideation or psychotic symptoms.  The patient reported motivation  to be compliant with the aftercare plans.   DISCHARGE MEDICATIONS:  1. Pepcid 20 mg b.i.d.  2. Lexapro 10 mg q.a.m.  3. Trazodone 100 mg q.h.s.  4. Monistat vaginal suppository for four days.  5. Klonopin 1 mg q.h.s.  6. Lantus insulin 22 units subcu at 8 a.m.   FOLLOW UP:  The patient was to follow up with Olga Coaster for therapy  at  __________ [facility] 3/31, at 11:30 a.m.   DISCHARGE DIAGNOSES:   AXIS I:  1. Bipolar disorder.  2. Possible alcohol dependence.  3. Opiate and cocaine abuse.   AXIS II:  None.   AXIS III:  Insulin-dependent diabetes mellitus.   AXIS IV:  Moderate (problems with primary support group and other  psychosocial stressors).   AXIS V:  Global Assessment of Functioning on discharge 55.                                                 Jeanice Lim, M.D.    JEM/MEDQ  D:  10/08/2002  T:  10/09/2002  Job:  [id number]
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
## 2010-11-18 NOTE — Discharge Summary (Signed)
[facility]  Patient:    Leslie Lane, Leslie Lane                       MRN: [id number] Adm. Date:  [id number] Disc. Date: [id number] Attending:  Milford Cage H                           Discharge Summary  REASON FOR ADMISSION:  Patient was a 55 year old Caucasian female admitted secondary to suicidal ideation. She has had a history of depression with recent escalation of symptoms and multiple neurovegetative symptoms.  She had stopped taking her Prozac one week prior to admission.  She also drinks beer and uses cocaine frequently.  For further psychiatric admission information, see psychiatric admission assessment.  PHYSICAL EXAMINATION:  This was done by [facility] D. Warner Mccreedy.  The patient was noted to have insulin-dependent diabetes mellitus. This is regulated by Jonelle Sports. Cheryll Cockayne, M.D., and GERD.  ADMISSION LABORATORIES:  The alcohol level was less than 5 (0 to 10). Comprehensive metabolic panel within normal limits except for elevated glucose of 242 (70 to 115).  CBC was within normal limits. Free T4 within normal limits, T3 uptake slightly elevated 37.6; this was not clinically significant (22.5 to 37).  TSH was within normal limits.  HOSPITAL COURSE:  Upon admission the patient was continued on her current regimen of Lantus insulin 22 units in the morning, Humalog regular 8 at 5 p.m., Prevacid 30 mg q.d. She also was continued on her sliding scale insulin which was obtained from Dr. Nada Boozer office.  On October 17, 2000, she was begun on Celexa 20 mg p.o. q.h.s.  She tolerated this medication well with no significant side effects. She was anxious about starting another antidepressant because she had heard that "Prozac makes your teeth rot."  She was assured that this would not happen.  On October 19, 2000, she was also begun on Xanax 0.5 mg p.o. t.i.d. due to significant anxiety. There was improvement in her mood and anxiety with this medication.  The patient  participated appropriately in unit therapeutic groups and activities, though initially stayed in bed much of the day. She had contact with her family and felt supported by them.  At the time of discharge she was felt to be able to be safely managed in a lesser restrictive setting.  DISCHARGE MENTAL STATUS EXAMINATION:  The patient was no longer disheveled. She was casually dressed. There was some psychomotor retardation but this had improved.  Eye contact had improved.  Speech was normal rate and flow.  Mood was less depressed.  Affect brighter, able to reveal wide range. No homicidal or suicidal ideations.  No psychosis or perceptual disturbance.  Thought processes better organization, logical and goal directed.  Cognitive exam was back to baseline.  Insight good.  Judgement good.  DISCHARGE DIAGNOSES: AXIS I.   1. Depressive disorder, not otherwise specified.           2. Cocaine dependence.           3. Alcohol dependence. AXIS II.  None. AXIS III. 1. Insulin-dependent diabetes mellitus.           2. Gastroesophageal reflux disease. AXIS IV.  Severe. AXIS V.   Global Assessment of Functioning current 50, highest in the past           year was 65, admission status 20.  DISCHARGE MEDICATIONS:  The patient will continue her  insulin regimen as prescribed by Dr. Cheryll Cockayne.  She will continue Prevacid 30 mg q.a.m.  She will continue Xanax 0.5 mg p.o. t.i.d. and Celexa 20 mg p.o. q.h.s.  ACTIVITY LEVEL:  No restrictions.  DIET:  No restrictions.  POST HOSPITAL CARE PLANS:  The patient will be followed at the [facility]. DD:  10/21/00 TD:  10/22/00 Job: [id number]

## 2011-03-22 LAB — COMPREHENSIVE METABOLIC PANEL
ALT: 16
Albumin: 4
Alkaline Phosphatase: 51
Potassium: 3.9
Sodium: 127 — ABNORMAL LOW
Total Protein: 6.8

## 2011-03-22 LAB — COMPREHENSIVE METABOLIC PANEL WITH GFR
AST: 22
BUN: 6
CO2: 28
Calcium: 8.8
Chloride: 93 — ABNORMAL LOW
Creatinine, Ser: 0.8
GFR calc Af Amer: >60
GFR calc non Af Amer: >60
Glucose, Bld: 36 — CL
Total Bilirubin: 0.5

## 2011-03-22 LAB — POCT CARDIAC MARKERS
Myoglobin, poc: 49.9
Troponin i, poc: <0.05

## 2011-03-22 LAB — DIFFERENTIAL
Basophils Absolute: 0.1
Basophils Relative: 1
Eosinophils Absolute: 0.3
Eosinophils Relative: 4
Lymphocytes Relative: 53 — ABNORMAL HIGH
Lymphs Abs: 4.3 — ABNORMAL HIGH
Monocytes Absolute: 0.6
Monocytes Relative: 8
Neutro Abs: 2.8
Neutrophils Relative %: 35 — ABNORMAL LOW

## 2011-03-22 LAB — CBC
HCT: 39.1
Hemoglobin: 12.9
MCHC: 33
MCV: 89
Platelets: 306
RBC: 4.39
RDW: 15.2
WBC: 8.2

## 2011-03-22 LAB — LIPASE, BLOOD: Lipase: 19

## 2011-03-28 ENCOUNTER — Encounter: Payer: Self-pay | Admitting: Internal Medicine

## 2011-03-28 ENCOUNTER — Ambulatory Visit (INDEPENDENT_AMBULATORY_CARE_PROVIDER_SITE_OTHER): Payer: Medicare HMO | Admitting: Internal Medicine

## 2011-03-28 DIAGNOSIS — M545 Low back pain, unspecified: Secondary | ICD-10-CM

## 2011-03-28 DIAGNOSIS — E785 Hyperlipidemia, unspecified: Secondary | ICD-10-CM

## 2011-03-28 DIAGNOSIS — E119 Type 2 diabetes mellitus without complications: Secondary | ICD-10-CM

## 2011-03-28 DIAGNOSIS — F319 Bipolar disorder, unspecified: Secondary | ICD-10-CM

## 2011-03-28 MED ORDER — TRAMADOL HCL 50 MG PO TABS: 50.0000 mg | ORAL_TABLET | Freq: Two times a day (BID) | ORAL | Status: DC | PRN

## 2011-03-28 NOTE — Assessment & Plan Note (Signed)
Follows with endo for same - lantus bid and humalog ssi - Last a1c at goal per pt The current medical regimen is effective;  continue present plan and medications.  Lab Results  Component Value Date   HGBA1C 7.6* 11/16/2010

## 2011-03-28 NOTE — Assessment & Plan Note (Signed)
On statin - The current medical regimen is effective;  continue present plan and medications. Last lipids 11/2010 reviewed - at goal

## 2011-03-28 NOTE — Patient Instructions (Addendum)
It was good to see you today. Medications reviewed, no changes at this time. Refill on medication(s) as discussed today.  Consider scheduling your mammogram as we discussed to screen for breast cancer - will send for records of colonoscopy from Dr. Remus Blake office Please schedule followup in 4 months, call sooner if problems.

## 2011-03-28 NOTE — Assessment & Plan Note (Signed)
Follows with Dr. Betti Cruz (psyc) for same q3mo - meds reviewed - symptoms generally stable

## 2011-03-28 NOTE — Assessment & Plan Note (Signed)
Underlying DDD  > "FM" - 07/2010 xray with mild ddd in neck already on muscle relaxant for migraines -  Consider change to robaxin for less sedation than baclofen at future visit if needed uses tramadol but causes constipation - also avoid narc given hx abuse and med sensitivity (sedation) - refills now Trial meloxicam as needed caused nausea - stopped same

## 2011-03-28 NOTE — Progress Notes (Signed)
  Subjective:    Patient ID: Leslie Lane, female    DOB: 30-Sep-1955, 55 y.o.   MRN: [id number]  HPI  Here for follow up - reviewed chronic medical issues today:  neck and back pain - chronic -  aches to sleep and aches do nothing- pain worse with any movement ?FM - mom has same no injury or trauma hx onset symptoms early 2010 no radiation of pain or numbness - xray c and l spine 07/2010 - mild DDD - reviewed with pt now on tramadol and reduced gabapentin - less sedate but feels constipated -  DM2 - insulin dep - follows with endo (kumar) for same - denies hx hypoglycemic signs or symptoms since summer 2011 (ER visit for same reviewed) - checks sugars 2-3x/d  depression, severe - hx - follows with psyc and mental health for same - reports compliance with ongoing medical treatment and no changes in medication dose or frequency. denies adverse side effects related to current therapy. no SI/HI  migraines - follows with [facility] for same ([facility]) - no change in meds  dyslipidemia - on statin for same since 2010- reports compliance with ongoing medical treatment and no changes in medication dose or frequency. denies adverse side effects related to current therapy.   Past Medical History  Diagnosis Date  . HEPATITIS B   . BIPOLAR AFFECTIVE DISORDER   . SMOKER   . MIGRAINE HEADACHE   . NECK PAIN, CHRONIC   . LOW BACK PAIN, CHRONIC   . ALLERGIC RHINITIS   . HYPERLIPIDEMIA   . GERD   . DIABETES MELLITUS, TYPE II   . DEPRESSION   . NARCOTIC ABUSE     Review of Systems  Constitutional: Negative for fatigue.  Respiratory: Negative for shortness of breath.   Cardiovascular: Negative for chest pain.  Neurological: Negative for syncope and headaches.       Objective:   Physical Exam  BP 102/52  Pulse 59  Temp(Src) 98 F (36.7 C) (Oral)  Ht 5\' 6"  (1.676 m)  Wt 135 lb 1.9 oz (61.29 kg)  BMI 21.81 kg/m2  SpO2 98%  Constitutional: She is well-developed and well-nourished. No  distress.  Neck: Normal range of motion. Neck supple. No JVD or LAD present. No thyromegaly present.  Cardiovascular: Normal rate, regular rhythm and normal heart sounds.  No murmur heard. No BLE edema. Pulmonary/Chest: Effort normal and breath sounds normal. No respiratory distress. She has no wheezes.  Neurological: She is alert and oriented to person, place, and time. No cranial nerve deficit. Coordination normal.  Psychiatric: She has a normal mood and affect. Her behavior is normal. Judgment and thought content normal.   Lab Results  Component Value Date   WBC 8.2 07/16/2007   HGB 13.3 01/31/2010   HCT 39.0 01/31/2010   PLT 306 07/16/2007   CHOL 191 11/16/2010   TRIG 57.0 11/16/2010   HDL 75.20 11/16/2010   ALT 16 07/16/2007   AST 22 07/16/2007   NA 134* 01/31/2010   K 3.6 01/31/2010   CL 109 01/31/2010   CREATININE 0.8 01/31/2010   BUN 9 01/31/2010   CO2 17 01/31/2010   HGBA1C 7.6* 11/16/2010        Assessment & Plan:  See problem list. Medications and labs reviewed today.

## 2011-03-29 ENCOUNTER — Encounter: Payer: Self-pay | Admitting: Internal Medicine

## 2011-04-25 ENCOUNTER — Emergency Department (HOSPITAL_COMMUNITY)
Admission: EM | Admit: 2011-04-25 | Discharge: 2011-04-25 | Disposition: A | Discharge status: Home / Self Care | Payer: Medicare HMO | Attending: Emergency Medicine | Admitting: Emergency Medicine

## 2011-04-25 DIAGNOSIS — F319 Bipolar disorder, unspecified: Secondary | ICD-10-CM | POA: Insufficient documentation

## 2011-04-25 DIAGNOSIS — R42 Dizziness and giddiness: Secondary | ICD-10-CM | POA: Insufficient documentation

## 2011-04-25 DIAGNOSIS — F17200 Nicotine dependence, unspecified, uncomplicated: Secondary | ICD-10-CM | POA: Insufficient documentation

## 2011-04-25 DIAGNOSIS — E1169 Type 2 diabetes mellitus with other specified complication: Secondary | ICD-10-CM | POA: Insufficient documentation

## 2011-04-25 DIAGNOSIS — F411 Generalized anxiety disorder: Secondary | ICD-10-CM | POA: Insufficient documentation

## 2011-04-25 LAB — POCT I-STAT, CHEM 8
Creatinine, Ser: 0.9 mg/dL (ref 0.50–1.10)
Glucose, Bld: 135 mg/dL — ABNORMAL HIGH (ref 70–99)
Hemoglobin: 12.2 g/dL (ref 12.0–15.0)
TCO2: 23 mmol/L (ref 0–100)

## 2011-04-25 LAB — URINALYSIS, ROUTINE W REFLEX MICROSCOPIC
Bilirubin Urine: NEGATIVE
Hgb urine dipstick: NEGATIVE
Protein, ur: NEGATIVE mg/dL
Urobilinogen, UA: 0.2 mg/dL (ref 0.0–1.0)

## 2011-07-28 ENCOUNTER — Ambulatory Visit (INDEPENDENT_AMBULATORY_CARE_PROVIDER_SITE_OTHER): Payer: Medicare HMO | Admitting: Internal Medicine

## 2011-07-28 ENCOUNTER — Encounter: Payer: Self-pay | Admitting: Internal Medicine

## 2011-07-28 DIAGNOSIS — Z124 Encounter for screening for malignant neoplasm of cervix: Secondary | ICD-10-CM

## 2011-07-28 DIAGNOSIS — Z1239 Encounter for other screening for malignant neoplasm of breast: Secondary | ICD-10-CM

## 2011-07-28 DIAGNOSIS — F319 Bipolar disorder, unspecified: Secondary | ICD-10-CM

## 2011-07-28 DIAGNOSIS — Z23 Encounter for immunization: Secondary | ICD-10-CM

## 2011-07-28 DIAGNOSIS — E119 Type 2 diabetes mellitus without complications: Secondary | ICD-10-CM

## 2011-07-28 NOTE — Patient Instructions (Signed)
It was good to see you today. Medications reviewed, no changes at this time. Refill on medication(s) as discussed today.  we'll make referral for PAP and mammogram . Our office will contact you regarding appointment(s) once made. Please schedule followup in 4 months, call sooner if problems.

## 2011-07-28 NOTE — Assessment & Plan Note (Signed)
Follows with Dr. Reddy (psyc) for same q4mo - meds reviewed - symptoms generally stable 

## 2011-07-28 NOTE — Assessment & Plan Note (Signed)
Follows with endo for same - lantus bid and humalog ssi - Last a1c at goal per pt The current medical regimen is effective;  continue present plan and medications.  Lab Results  Component Value Date   HGBA1C 7.6* 11/16/2010   
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
## 2011-08-17 ENCOUNTER — Ambulatory Visit
Admission: RE | Admit: 2011-08-17 | Discharge: 2011-08-17 | Disposition: A | Discharge status: Home / Self Care | Payer: Medicare HMO | Source: Ambulatory Visit | Attending: Internal Medicine | Admitting: Internal Medicine

## 2011-08-17 DIAGNOSIS — Z1239 Encounter for other screening for malignant neoplasm of breast: Secondary | ICD-10-CM

## 2011-08-22 ENCOUNTER — Other Ambulatory Visit: Payer: Self-pay | Admitting: Internal Medicine

## 2011-08-22 DIAGNOSIS — R928 Other abnormal and inconclusive findings on diagnostic imaging of breast: Secondary | ICD-10-CM

## 2011-08-31 ENCOUNTER — Ambulatory Visit
Admission: RE | Admit: 2011-08-31 | Discharge: 2011-08-31 | Disposition: A | Discharge status: Home / Self Care | Payer: Medicare HMO | Source: Ambulatory Visit | Attending: Internal Medicine | Admitting: Internal Medicine

## 2011-08-31 DIAGNOSIS — R928 Other abnormal and inconclusive findings on diagnostic imaging of breast: Secondary | ICD-10-CM

## 2011-09-07 ENCOUNTER — Other Ambulatory Visit (HOSPITAL_COMMUNITY)
Admission: RE | Admit: 2011-09-07 | Discharge: 2011-09-07 | Disposition: A | Discharge status: Home / Self Care | Payer: Medicare HMO | Source: Ambulatory Visit | Attending: Obstetrics & Gynecology | Admitting: Obstetrics & Gynecology

## 2011-09-07 ENCOUNTER — Ambulatory Visit (INDEPENDENT_AMBULATORY_CARE_PROVIDER_SITE_OTHER): Payer: Medicare HMO | Admitting: Obstetrics & Gynecology

## 2011-09-07 ENCOUNTER — Encounter: Payer: Self-pay | Admitting: Obstetrics & Gynecology

## 2011-09-07 DIAGNOSIS — Z124 Encounter for screening for malignant neoplasm of cervix: Secondary | ICD-10-CM

## 2011-09-07 DIAGNOSIS — Z01419 Encounter for gynecological examination (general) (routine) without abnormal findings: Secondary | ICD-10-CM

## 2011-09-07 NOTE — Progress Notes (Signed)
  Subjective:     Leslie Lane is a 56 y.o.G1P1 PMP female and is here for a comprehensive physical exam. The patient reports no problems. Last pap was 2-3 years ago. Not sexually active, has not been active for seven years.  Gets occasional yeast infections, associated with periods of hyperglycemia. Self-treats with boric acid and monistat.  No abnormal bleeding since menopause at age 74.  Patient Active Problem List  Diagnoses  . HEPATITIS B  . DIABETES MELLITUS, TYPE II  . HYPERLIPIDEMIA  . BIPOLAR AFFECTIVE DISORDER  . SMOKER  . NARCOTIC ABUSE  . DEPRESSION  . MIGRAINE HEADACHE  . ALLERGIC RHINITIS  . GERD  . NECK PAIN, CHRONIC  . LOW BACK PAIN, CHRONIC    History   Social History  . Marital Status: Divorced    Spouse Name: N/A    Number of Children: N/A  . Years of Education: N/A   Occupational History  . Not on file.   Social History Main Topics  . Smoking status: Current Everyday Smoker  . Smokeless tobacco: Not on file   Comment: divorced, lives with her parents but has boyfriend-disabled  . Alcohol Use: No  . Drug Use: No  . Sexually Active: Not on file   Other Topics Concern  . Not on file   Social History Narrative   Divorced, disabledLives with parents   Health Maintenance  Topic Date Due  . Pap Smear  04/20/1972  . Influenza Vaccine  04/02/2012  . Mammogram  08/30/2013  . Colonoscopy  05/27/2019  . Tetanus/tdap  07/27/2021    The following portions of the patient's history were reviewed and updated as appropriate: allergies, current medications, past family history, past medical history, past social history, past surgical history and problem list.  Review of Systems A comprehensive review of systems was negative.   Objective:     Blood pressure 118/75, pulse 79, temperature 98.2 F (36.8 C), temperature source Oral, height 5\' 4"  (1.626 m), weight 137 lb 14.4 oz (62.551 kg). GENERAL: Well-developed, well-nourished female in no acute distress.    HEENT: Normocephalic, atraumatic. Sclerae anicteric.  NECK: Supple. Normal thyroid.  LUNGS: Clear to auscultation bilaterally.  HEART: Regular rate and rhythm. BREASTS: Deferred as per patient request ABDOMEN: Soft, nontender, nondistended. No organomegaly. PELVIC: Normal external female genitalia. Vagina is pink with mild loss of rugae. Stenotic and atrophic introitus, able to use a Pedersen speculum with some difficulty.  Normal discharge. Normal cervix contour. Pap smear obtained. Uterus is small in size. No adnexal mass or tenderness.  EXTREMITIES: No cyanosis, clubbing, or edema, 2+ distal pulses.    Assessment:    Healthy female exam. No concerns     Plan:   Follow up pap smear Will follow up with PCP for other medical issues   See After Visit Summary for Counseling Recommendations

## 2011-09-07 NOTE — Patient Instructions (Signed)
Preventive Care for Adults, Female A healthy lifestyle and preventive care can promote health and wellness. Preventive health guidelines for women include the following key practices.  A routine yearly physical is a good way to check with your caregiver about your health and preventive screening. It is a chance to share any concerns and updates on your health, and to receive a thorough exam.   Visit your dentist for a routine exam and preventive care every 6 months. Brush your teeth twice a day and floss once a day. Good oral hygiene prevents tooth decay and gum disease.   The frequency of eye exams is based on your age, health, family medical history, use of contact lenses, and other factors. Follow your caregiver's recommendations for frequency of eye exams.   Eat a healthy diet. Foods like vegetables, fruits, whole grains, low-fat dairy products, and lean protein foods contain the nutrients you need without too many calories. Decrease your intake of foods high in solid fats, added sugars, and salt. Eat the right amount of calories for you.Get information about a proper diet from your caregiver, if necessary.   Regular physical exercise is one of the most important things you can do for your health. Most adults should get at least 150 minutes of moderate-intensity exercise (any activity that increases your heart rate and causes you to sweat) each week. In addition, most adults need muscle-strengthening exercises on 2 or more days a week.   Maintain a healthy weight. The body mass index (BMI) is a screening tool to identify possible weight problems. It provides an estimate of body fat based on height and weight. Your caregiver can help determine your BMI, and can help you achieve or maintain a healthy weight.For adults 20 years and older:   A BMI below 18.5 is considered underweight.   A BMI of 18.5 to 24.9 is normal.   A BMI of 25 to 29.9 is considered overweight.   A BMI of 30 and above is  considered obese.   Maintain normal blood lipids and cholesterol levels by exercising and minimizing your intake of saturated fat. Eat a balanced diet with plenty of fruit and vegetables. Blood tests for lipids and cholesterol should begin at age 20 and be repeated every 5 years. If your lipid or cholesterol levels are high, you are over 50, or you are at high risk for heart disease, you may need your cholesterol levels checked more frequently.Ongoing high lipid and cholesterol levels should be treated with medicines if diet and exercise are not effective.   If you smoke, find out from your caregiver how to quit. If you do not use tobacco, do not start.   If you are pregnant, do not drink alcohol. If you are breastfeeding, be very cautious about drinking alcohol. If you are not pregnant and choose to drink alcohol, do not exceed 1 drink per day. One drink is considered to be 12 ounces (355 mL) of beer, 5 ounces (148 mL) of wine, or 1.5 ounces (44 mL) of liquor.   Avoid use of street drugs. Do not share needles with anyone. Ask for help if you need support or instructions about stopping the use of drugs.   High blood pressure causes heart disease and increases the risk of stroke. Your blood pressure should be checked at least every 1 to 2 years. Ongoing high blood pressure should be treated with medicines if weight loss and exercise are not effective.   If you are 55 to 58   years old, ask your caregiver if you should take aspirin to prevent strokes.   Diabetes screening involves taking a blood sample to check your fasting blood sugar level. This should be done once every 3 years, after age 45, if you are within normal weight and without risk factors for diabetes. Testing should be considered at a younger age or be carried out more frequently if you are overweight and have at least 1 risk factor for diabetes.   Breast cancer screening is essential preventive care for women. You should practice "breast  self-awareness." This means understanding the normal appearance and feel of your breasts and may include breast self-examination. Any changes detected, no matter how small, should be reported to a caregiver. Women in their 20s and 30s should have a clinical breast exam (CBE) by a caregiver as part of a regular health exam every 1 to 3 years. After age 40, women should have a CBE every year. Starting at age 40, women should consider having a mammography (breast X-ray test) every year. Women who have a family history of breast cancer should talk to their caregiver about genetic screening. Women at a high risk of breast cancer should talk to their caregivers about having magnetic resonance imaging (MRI) and a mammography every year.   The Pap test is a screening test for cervical cancer. A Pap test can show cell changes on the cervix that might become cervical cancer if left untreated. A Pap test is a procedure in which cells are obtained and examined from the lower end of the uterus (cervix).   Women should have a Pap test starting at age 21.   Between ages 21 and 29, Pap tests should be repeated every 2 years.   Beginning at age 30, you should have a Pap test every 3 years as long as the past 3 Pap tests have been normal.   Some women have medical problems that increase the chance of getting cervical cancer. Talk to your caregiver about these problems. It is especially important to talk to your caregiver if a new problem develops soon after your last Pap test. In these cases, your caregiver may recommend more frequent screening and Pap tests.   The above recommendations are the same for women who have or have not gotten the vaccine for human papillomavirus (HPV).   If you had a hysterectomy for a problem that was not cancer or a condition that could lead to cancer, then you no longer need Pap tests. Even if you no longer need a Pap test, a regular exam is a good idea to make sure no other problems are  starting.   If you are between ages 65 and 70, and you have had normal Pap tests going back 10 years, you no longer need Pap tests. Even if you no longer need a Pap test, a regular exam is a good idea to make sure no other problems are starting.   If you have had past treatment for cervical cancer or a condition that could lead to cancer, you need Pap tests and screening for cancer for at least 20 years after your treatment.   If Pap tests have been discontinued, risk factors (such as a new sexual partner) need to be reassessed to determine if screening should be resumed.   The HPV test is an additional test that may be used for cervical cancer screening. The HPV test looks for the virus that can cause the cell changes on the cervix.   The cells collected during the Pap test can be tested for HPV. The HPV test could be used to screen women aged 30 years and older, and should be used in women of any age who have unclear Pap test results. After the age of 30, women should have HPV testing at the same frequency as a Pap test.   Colorectal cancer can be detected and often prevented. Most routine colorectal cancer screening begins at the age of 50 and continues through age 75. However, your caregiver may recommend screening at an earlier age if you have risk factors for colon cancer. On a yearly basis, your caregiver may provide home test kits to check for hidden blood in the stool. Use of a small camera at the end of a tube, to directly examine the colon (sigmoidoscopy or colonoscopy), can detect the earliest forms of colorectal cancer. Talk to your caregiver about this at age 50, when routine screening begins. Direct examination of the colon should be repeated every 5 to 10 years through age 75, unless early forms of pre-cancerous polyps or small growths are found.   Hepatitis C blood testing is recommended for all people born from 1945 through 1965 and any individual with known risks for hepatitis C.    Practice safe sex. Use condoms and avoid high-risk sexual practices to reduce the spread of sexually transmitted infections (STIs). STIs include gonorrhea, chlamydia, syphilis, trichomonas, herpes, HPV, and human immunodeficiency virus (HIV). Herpes, HIV, and HPV are viral illnesses that have no cure. They can result in disability, cancer, and death. Sexually active women aged 25 and younger should be checked for chlamydia. Older women with new or multiple partners should also be tested for chlamydia. Testing for other STIs is recommended if you are sexually active and at increased risk.   Osteoporosis is a disease in which the bones lose minerals and strength with aging. This can result in serious bone fractures. The risk of osteoporosis can be identified using a bone density scan. Women ages 65 and over and women at risk for fractures or osteoporosis should discuss screening with their caregivers. Ask your caregiver whether you should take a calcium supplement or vitamin D to reduce the rate of osteoporosis.   Menopause can be associated with physical symptoms and risks. Hormone replacement therapy is available to decrease symptoms and risks. You should talk to your caregiver about whether hormone replacement therapy is right for you.   Use sunscreen with sun protection factor (SPF) of 30 or more. Apply sunscreen liberally and repeatedly throughout the day. You should seek shade when your shadow is shorter than you. Protect yourself by wearing long sleeves, pants, a wide-brimmed hat, and sunglasses year round, whenever you are outdoors.   Once a month, do a whole body skin exam, using a mirror to look at the skin on your back. Notify your caregiver of new moles, moles that have irregular borders, moles that are larger than a pencil eraser, or moles that have changed in shape or color.   Stay current with required immunizations.   Influenza. You need a dose every fall (or winter). The composition of  the flu vaccine changes each year, so being vaccinated once is not enough.   Pneumococcal polysaccharide. You need 1 to 2 doses if you smoke cigarettes or if you have certain chronic medical conditions. You need 1 dose at age 65 (or older) if you have never been vaccinated.   Tetanus, diphtheria, pertussis (Tdap, Td). Get 1 dose of   Tdap vaccine if you are younger than age 65, are over 65 and have contact with an infant, are a healthcare worker, are pregnant, or simply want to be protected from whooping cough. After that, you need a Td booster dose every 10 years. Consult your caregiver if you have not had at least 3 tetanus and diphtheria-containing shots sometime in your life or have a deep or dirty wound.   HPV. You need this vaccine if you are a woman age 26 or younger. The vaccine is given in 3 doses over 6 months.   Measles, mumps, rubella (MMR). You need at least 1 dose of MMR if you were born in 1957 or later. You may also need a second dose.   Meningococcal. If you are age 19 to 21 and a first-year college student living in a residence hall, or have one of several medical conditions, you need to get vaccinated against meningococcal disease. You may also need additional booster doses.   Zoster (shingles). If you are age 60 or older, you should get this vaccine.   Varicella (chickenpox). If you have never had chickenpox or you were vaccinated but received only 1 dose, talk to your caregiver to find out if you need this vaccine.   Hepatitis A. You need this vaccine if you have a specific risk factor for hepatitis A virus infection or you simply wish to be protected from this disease. The vaccine is usually given as 2 doses, 6 to 18 months apart.   Hepatitis B. You need this vaccine if you have a specific risk factor for hepatitis B virus infection or you simply wish to be protected from this disease. The vaccine is given in 3 doses, usually over 6 months.  Preventive Services /  Frequency Ages 19 to 39  Blood pressure check.** / Every 1 to 2 years.   Lipid and cholesterol check.** / Every 5 years beginning at age 20.   Clinical breast exam.** / Every 3 years for women in their 20s and 30s.   Pap test.** / Every 2 years from ages 21 through 29. Every 3 years starting at age 30 through age 65 or 70 with a history of 3 consecutive normal Pap tests.   HPV screening.** / Every 3 years from ages 30 through ages 65 to 70 with a history of 3 consecutive normal Pap tests.   Hepatitis C blood test.** / For any individual with known risks for hepatitis C.   Skin self-exam. / Monthly.   Influenza immunization.** / Every year.   Pneumococcal polysaccharide immunization.** / 1 to 2 doses if you smoke cigarettes or if you have certain chronic medical conditions.   Tetanus, diphtheria, pertussis (Tdap, Td) immunization. / A one-time dose of Tdap vaccine. After that, you need a Td booster dose every 10 years.   HPV immunization. / 3 doses over 6 months, if you are 26 and younger.   Measles, mumps, rubella (MMR) immunization. / You need at least 1 dose of MMR if you were born in 1957 or later. You may also need a second dose.   Meningococcal immunization. / 1 dose if you are age 19 to 21 and a first-year college student living in a residence hall, or have one of several medical conditions, you need to get vaccinated against meningococcal disease. You may also need additional booster doses.   Varicella immunization.** / Consult your caregiver.   Hepatitis A immunization.** / Consult your caregiver. 2 doses, 6 to 18 months   apart.   Hepatitis B immunization.** / Consult your caregiver. 3 doses usually over 6 months.  Ages 40 to 64  Blood pressure check.** / Every 1 to 2 years.   Lipid and cholesterol check.** / Every 5 years beginning at age 20.   Clinical breast exam.** / Every year after age 40.   Mammogram.** / Every year beginning at age 40 and continuing for as  long as you are in good health. Consult with your caregiver.   Pap test.** / Every 3 years starting at age 30 through age 65 or 70 with a history of 3 consecutive normal Pap tests.   HPV screening.** / Every 3 years from ages 30 through ages 65 to 70 with a history of 3 consecutive normal Pap tests.   Fecal occult blood test (FOBT) of stool. / Every year beginning at age 50 and continuing until age 75. You may not need to do this test if you get a colonoscopy every 10 years.   Flexible sigmoidoscopy or colonoscopy.** / Every 5 years for a flexible sigmoidoscopy or every 10 years for a colonoscopy beginning at age 50 and continuing until age 75.   Hepatitis C blood test.** / For all people born from 1945 through 1965 and any individual with known risks for hepatitis C.   Skin self-exam. / Monthly.   Influenza immunization.** / Every year.   Pneumococcal polysaccharide immunization.** / 1 to 2 doses if you smoke cigarettes or if you have certain chronic medical conditions.   Tetanus, diphtheria, pertussis (Tdap, Td) immunization.** / A one-time dose of Tdap vaccine. After that, you need a Td booster dose every 10 years.   Measles, mumps, rubella (MMR) immunization. / You need at least 1 dose of MMR if you were born in 1957 or later. You may also need a second dose.   Varicella immunization.** / Consult your caregiver.   Meningococcal immunization.** / Consult your caregiver.   Hepatitis A immunization.** / Consult your caregiver. 2 doses, 6 to 18 months apart.   Hepatitis B immunization.** / Consult your caregiver. 3 doses, usually over 6 months.  Ages 65 and over  Blood pressure check.** / Every 1 to 2 years.   Lipid and cholesterol check.** / Every 5 years beginning at age 20.   Clinical breast exam.** / Every year after age 40.   Mammogram.** / Every year beginning at age 40 and continuing for as long as you are in good health. Consult with your caregiver.   Pap test.** /  Every 3 years starting at age 30 through age 65 or 70 with a 3 consecutive normal Pap tests. Testing can be stopped between 65 and 70 with 3 consecutive normal Pap tests and no abnormal Pap or HPV tests in the past 10 years.   HPV screening.** / Every 3 years from ages 30 through ages 65 or 70 with a history of 3 consecutive normal Pap tests. Testing can be stopped between 65 and 70 with 3 consecutive normal Pap tests and no abnormal Pap or HPV tests in the past 10 years.   Fecal occult blood test (FOBT) of stool. / Every year beginning at age 50 and continuing until age 75. You may not need to do this test if you get a colonoscopy every 10 years.   Flexible sigmoidoscopy or colonoscopy.** / Every 5 years for a flexible sigmoidoscopy or every 10 years for a colonoscopy beginning at age 50 and continuing until age 75.   Hepatitis   C blood test.** / For all people born from 1945 through 1965 and any individual with known risks for hepatitis C.   Osteoporosis screening.** / A one-time screening for women ages 65 and over and women at risk for fractures or osteoporosis.   Skin self-exam. / Monthly.   Influenza immunization.** / Every year.   Pneumococcal polysaccharide immunization.** / 1 dose at age 65 (or older) if you have never been vaccinated.   Tetanus, diphtheria, pertussis (Tdap, Td) immunization. / A one-time dose of Tdap vaccine if you are over 65 and have contact with an infant, are a healthcare worker, or simply want to be protected from whooping cough. After that, you need a Td booster dose every 10 years.   Varicella immunization.** / Consult your caregiver.   Meningococcal immunization.** / Consult your caregiver.   Hepatitis A immunization.** / Consult your caregiver. 2 doses, 6 to 18 months apart.   Hepatitis B immunization.** / Check with your caregiver. 3 doses, usually over 6 months.  ** Family history and personal history of risk and conditions may change your caregiver's  recommendations. Document Released: 08/15/2001 Document Revised: 06/08/2011 Document Reviewed: 11/14/2010 [employer] Patient Information 2012 [employer]. 

## 2011-11-01 ENCOUNTER — Other Ambulatory Visit: Payer: Self-pay | Admitting: Internal Medicine

## 2011-11-07 ENCOUNTER — Emergency Department (HOSPITAL_COMMUNITY)
Admission: EM | Admit: 2011-11-07 | Discharge: 2011-11-07 | Disposition: A | Discharge status: Home / Self Care | Payer: Medicare HMO | Source: Home / Self Care | Attending: Emergency Medicine | Admitting: Emergency Medicine

## 2011-11-07 ENCOUNTER — Encounter (HOSPITAL_COMMUNITY): Payer: Self-pay | Admitting: *Deleted

## 2011-11-07 DIAGNOSIS — X58XXXA Exposure to other specified factors, initial encounter: Secondary | ICD-10-CM

## 2011-11-07 DIAGNOSIS — IMO0002 Reserved for concepts with insufficient information to code with codable children: Secondary | ICD-10-CM

## 2011-11-07 DIAGNOSIS — T148XXA Other injury of unspecified body region, initial encounter: Secondary | ICD-10-CM

## 2011-11-07 MED ORDER — TRAMADOL HCL 50 MG PO TABS: 100.0000 mg | ORAL_TABLET | Freq: Three times a day (TID) | ORAL | Status: DC | PRN

## 2011-11-07 NOTE — ED Provider Notes (Signed)
Chief Complaint  Patient presents with  . Extremity Laceration    History of Present Illness:  Mrs. Pearse is a 56 year old diabetic who around 3 PM today lacerated her right hand on some broken glass as well as she was washing dishes. She has lacerations on the lateral aspect of the hand and the dorsum of the hand. These are bleeding profusely. She denies any numbness or tingling. She has full movement of all her digits. Her last admission was either the beginning of this year at the end of last year.  Review of Systems:  Other than noted above, the patient denies any of the following symptoms: Systemic:  No fever or chills. Lungs:  No cough or dysnpnea. Cardiac:  No chest pain, palpitations or syncope. GI:  No abdominal pain, nausea or vomiting.  PMFSH:  Past medical history, family history, social history, meds, and allergies were reviewed.  Physical Exam:   Vital signs:  BP 174/77  Pulse 78  Temp 98.4 F (36.9 C)  Resp 20  SpO2 99% General:  Alert, oreinted, and in no distress.  Skin warm and dry. Skin:  On the dorsum of the hand there is a 2.5 cm laceration overlying the second metacarpal. This does not involve the tendon and there were no foreign bodies noted. She is able to fully extend the index finger. Overlying the lateral aspect of the hand, at the level of the first metacarpal, there was a 3 cm flap laceration. This was bleeding profusely, but did not involve the extensor tendon and she is able to fully extend the thumb. There was no foreign body palpated or seen. She has a full range of motion of all her digits. Sensation is intact. Pulses are full. She has good capillary refill.  Procedure: Verbal informed consent was obtained.  The patient was informed of the risks and benefits of the procedure and understands and accepts.  Identity of the patient was verified verbally and by wristband.   The laceration area described above was prepped with Betadine and anesthetized with 15  mL of 2% Xylocaine without epinephrine.  The wound was then closed as follows:  The laceration on the dorsum was closed with 7 5-0 nylon sutures. The laceration on the lateral aspect of the hand was closed with 11 5-0 nylon sutures.  There were no immediate complications, and the patient tolerated the procedure well. The laceration was then cleansed, Bacitracin ointment was applied and a clean, dry pressure dressing was put on.   Assessment:  The encounter diagnosis was Laceration.  Plan:   1.  The following meds were prescribed:   New Prescriptions   TRAMADOL (ULTRAM) 50 MG TABLET    Take 2 tablets (100 mg total) by mouth every 8 (eight) hours as needed for pain.   2.  The patient was instructed in wound care and pain control, and handouts were given. 3.  The patient was told to return in 14 days for suture removal or wound recheck or sooner if any sign of infection.    Reuben Likes, MD 11/07/11 [phone]

## 2011-11-07 NOTE — ED Notes (Signed)
Laceration on the  thumb and dorsum of the right hand with active bleeding.   Pt reports her last tetanus was about a year ago.

## 2011-11-07 NOTE — Discharge Instructions (Signed)

## 2011-11-23 ENCOUNTER — Encounter: Payer: Self-pay | Admitting: Internal Medicine

## 2011-11-23 ENCOUNTER — Ambulatory Visit (INDEPENDENT_AMBULATORY_CARE_PROVIDER_SITE_OTHER): Payer: Medicare HMO | Admitting: Internal Medicine

## 2011-11-23 VITALS — BP 140/68 | HR 99 | Temp 99.5°F | Ht 64.0 in | Wt 146.8 lb

## 2011-11-23 DIAGNOSIS — F319 Bipolar disorder, unspecified: Secondary | ICD-10-CM

## 2011-11-23 DIAGNOSIS — E785 Hyperlipidemia, unspecified: Secondary | ICD-10-CM

## 2011-11-23 DIAGNOSIS — S61419A Laceration without foreign body of unspecified hand, initial encounter: Secondary | ICD-10-CM

## 2011-11-23 DIAGNOSIS — S61409A Unspecified open wound of unspecified hand, initial encounter: Secondary | ICD-10-CM

## 2011-11-23 DIAGNOSIS — N390 Urinary tract infection, site not specified: Secondary | ICD-10-CM

## 2011-11-23 LAB — POCT URINALYSIS DIPSTICK
Ketones, UA: NEGATIVE
Protein, UA: 2000
Spec Grav, UA: <=1.005
pH, UA: 5

## 2011-11-23 MED ORDER — CIPROFLOXACIN HCL 500 MG PO TABS: 500.0000 mg | ORAL_TABLET | Freq: Two times a day (BID) | ORAL | Status: DC

## 2011-11-23 MED ORDER — ESCITALOPRAM OXALATE 20 MG PO TABS: 20.0000 mg | ORAL_TABLET | Freq: Two times a day (BID) | ORAL | Status: DC

## 2011-11-23 MED ORDER — CIPROFLOXACIN HCL 500 MG PO TABS: 500.0000 mg | ORAL_TABLET | Freq: Two times a day (BID) | ORAL | Status: AC

## 2011-11-23 NOTE — Patient Instructions (Signed)
It was good to see you today. We removed stitches from your hand today - keep covered with bandaid as needed Cipro antibiotics for your UTI -  Refill Lexapro to take 2x/day - and follow up with behavioral health! Your prescription(s) have been submitted to your pharmacy. Please take as directed and contact our office if you believe you are having problem(s) with the medication(s). Please schedule followup in 4 months, call sooner if problems.  Suture Removal You have had your sutures (stitches) removed today. This means your wound has healed well enough to take out your stitches. Be careful to protect the wound area over the next several weeks. An injury this area could cause the cut to split open again. It usually takes 1-2 years for a scar to get its full strength and loose its redness. For wounds that heal slowly, tapes may be applied to reinforce the skin for several days after the stitches are removed. You may allow the sutured area to get wet. Topical antibiotics (antibiotics you put on your skin) are not usually needed at this point. Applying vitamin E oil and aloe vera ointments may help the wound heal faster and stronger. Some scars form extra pigment with exposure to sunlight during the first 6-12 months after repair. This can be prevented by using a sun block (SPF 15-30) on the affected area. Call your doctor if you have any concerns about your injury. Call right away if you have any evidence of wound infection such as increased pain, drainage, redness, or swelling. Document Released: 07/27/2004 Document Revised: 06/08/2011 Document Reviewed: 04/10/2008 [facility] Patient Information 2012 [place], [place]. Urinary Tract Infection A urinary tract infection (UTI) is often caused by a germ (bacteria). A UTI is usually helped with medicine (antibiotics) that kills germs. Take all the medicine until it is gone. Do this even if you are feeling better. You are usually better in 7 to 10 days. HOME CARE     Drink enough water and fluids to keep your pee (urine) clear or pale yellow. Drink:   Cranberry juice.   Water.   Avoid:   Caffeine.   Tea.   Bubbly (carbonated) drinks.   Alcohol.   Only take medicine as told by your doctor.   To prevent further infections:   Pee often.   After pooping (bowel movement), women should wipe from front to back. Use each tissue only once.   Pee before and after having sex (intercourse).  Ask your doctor when your test results will be ready. Make sure you follow up and get your test results.   GET HELP RIGHT AWAY IF:    There is very bad back pain or lower belly (abdominal) pain.   You get the chills.   You have a fever.   Your baby is older than 3 months with a rectal temperature of 102 F (38.9 C) or higher.   Your baby is 30 months old or younger with a rectal temperature of 100.4 F (38 C) or higher.   You feel sick to your stomach (nauseous) or throw up (vomit).   There is continued burning with peeing.   Your problems are not better in 3 days. Return sooner if you are getting worse.  MAKE SURE YOU:    Understand these instructions.   Will watch your condition.   Will get help right away if you are not doing well or get worse.  Document Released: 12/06/2007 Document Revised: 06/08/2011 Document Reviewed: 12/06/2007 [employer] Patient Information 2012  [employer].

## 2011-11-23 NOTE — Assessment & Plan Note (Signed)
On statin -  The current medical regimen is effective;  continue present plan and medications. Last lipids reviewed - at goal  

## 2011-11-23 NOTE — Assessment & Plan Note (Signed)
Encouraged to follow up with Dr. Betti Cruz (psyc) for same q9mo - meds reviewed - symptoms generally stable Refill now

## 2011-11-23 NOTE — Progress Notes (Signed)
Subjective:    Patient ID: Leslie Lane, female    DOB: [birth year], 56 y.o.   MRN: [id number]  HPI  Here for follow up - reviewed chronic medical issues today:  neck and back pain - chronic -  aches to sleep and aches do nothing- pain worse with any movement mom has FM, no injury or trauma hx for spinal pain onset symptoms early 2010 no radiation of pain or numbness - xray c and l spine 07/2010 - mild DDD - reviewed with pt now on tramadol and reduced gabapentin because of sedation   DM2 - insulin dep - follows with endo (kumar) for same - denies hx hypoglycemic signs or symptoms since summer 2011 (ER visit for same reviewed) - checks sugars 2-3x/d  depression, severe - hx - previously followed with psyc and mental health for same, missed last few appts, requests refills here - reports compliance with ongoing medical treatment and no changes in medication dose or frequency. denies adverse side effects related to current therapy. no SI/HI  migraines - follows with [facility] for same (h[facility]) - no change in meds  dyslipidemia - on statin for same since 2010- reports compliance with ongoing medical treatment and no changes in medication dose or frequency. denies adverse side effects related to current therapy.   Also needs stitch removal from R hand - placed 2 weeks ago [facility]  Past Medical History  Diagnosis Date  . HEPATITIS B   . BIPOLAR AFFECTIVE DISORDER   . SMOKER   . MIGRAINE HEADACHE   . NECK PAIN, CHRONIC   . LOW BACK PAIN, CHRONIC   . ALLERGIC RHINITIS   . HYPERLIPIDEMIA   . GERD   . DIABETES MELLITUS, TYPE II   . DEPRESSION   . NARCOTIC ABUSE     Review of Systems  Constitutional: Negative for fever and fatigue.  Respiratory: Negative for cough and shortness of breath.   Cardiovascular: Negative for chest pain and leg swelling.  Neurological: Negative for syncope and headaches.       Objective:   Physical Exam  BP 140/68  Pulse 99  Temp(Src) 99.5 F  (37.5 C) (Oral)  Ht 5\' 4"  (1.626 m)  Wt 146 lb 12.8 oz (66.588 kg)  BMI 25.20 kg/m2  SpO2 95% Wt Readings from Last 3 Encounters:  11/23/11 146 lb 12.8 oz (66.588 kg)  09/07/11 137 lb 14.4 oz (62.551 kg)  07/28/11 139 lb (63.05 kg)   Constitutional: She is well-developed and well-nourished. No distress.  Neck: Normal range of motion. Neck supple. No JVD or LAD present. No thyromegaly present.  Cardiovascular: Normal rate, regular rhythm and normal heart sounds.  No murmur heard. No BLE edema. Pulmonary/Chest: Effort normal and breath sounds normal. No respiratory distress. She has no wheezes.  Skin - R hand with 2 lacerations - over R base and extensor surface - approximated without infection or drainage Psychiatric: She has expansive mood and affect. Her behavior is normal. Judgment and thought content normal.   Lab Results  Component Value Date   WBC 8.2 07/16/2007   HGB 12.2 04/25/2011   HCT 36.0 04/25/2011   PLT 306 07/16/2007   CHOL 191 11/16/2010   TRIG 57.0 11/16/2010   HDL 75.20 11/16/2010   ALT 16 07/16/2007   AST 22 07/16/2007   NA 142 04/25/2011   K 3.7 04/25/2011   CL 106 04/25/2011   CREATININE 0.90 04/25/2011   BUN 7 04/25/2011   CO2 17 01/31/2010  HGBA1C 7.6* 11/16/2010   No results found for this basename: VITAMINB12        Assessment & Plan:  See problem list. Medications and labs reviewed today. R hand laceration - stiches removed today   UTI - +Udip - cipro bid x 1 week

## 2011-12-07 ENCOUNTER — Other Ambulatory Visit: Payer: Self-pay

## 2011-12-07 MED ORDER — TRAMADOL HCL 50 MG PO TABS: 100.0000 mg | ORAL_TABLET | Freq: Three times a day (TID) | ORAL | Status: DC | PRN

## 2011-12-28 ENCOUNTER — Telehealth: Payer: Self-pay | Admitting: *Deleted

## 2011-12-28 NOTE — Telephone Encounter (Signed)
Received PA for escitalopram 20 mg. Notified insurance faxing over PA form to be completed.Recieved PA place on md desk for signature.. 12/28/11@4 :28pm/LMB

## 2011-12-29 NOTE — Telephone Encounter (Signed)
done

## 2011-12-29 NOTE — Telephone Encounter (Signed)
Faxed PA back to insurance waiting on approval status... 12/29/11@9 :35am/LMB

## 2012-01-01 NOTE — Telephone Encounter (Signed)
Notified pharmacy spoke with Feliz Beam med has been approve... 01/01/12@1 :47pm/LMB

## 2012-01-17 ENCOUNTER — Emergency Department (HOSPITAL_COMMUNITY)
Admission: EM | Admit: 2012-01-17 | Discharge: 2012-01-17 | Disposition: A | Discharge status: Home / Self Care | Payer: Medicare HMO | Attending: Emergency Medicine | Admitting: Emergency Medicine

## 2012-01-17 ENCOUNTER — Telehealth: Payer: Self-pay | Admitting: Internal Medicine

## 2012-01-17 ENCOUNTER — Encounter (HOSPITAL_COMMUNITY): Payer: Self-pay | Admitting: *Deleted

## 2012-01-17 DIAGNOSIS — K219 Gastro-esophageal reflux disease without esophagitis: Secondary | ICD-10-CM | POA: Insufficient documentation

## 2012-01-17 NOTE — Telephone Encounter (Signed)
Noted thanks °

## 2012-01-17 NOTE — Telephone Encounter (Signed)
Caller: Leslie Lane/Patient; PCP: Rene Paci; CB#: [phone]; ; ; Call regarding Sore Throat;  Woke with pain in center chest at 4 am today 01/17/2012.  Pain feels like burning feeling from chest up through throat. Feeling hot like burning up, unable to take temp.  Constant burning sensation severe if swallowing anything including water, frozen popcicle, egg sandwich, ice cream.  Pain constant and severe.  States BS normal. Triaged in Chest Pain Guideline - Disposition: Activate EMS 911 due to chest pain radiating to neck for more than 5 min in the last hour. Agrees to call 911

## 2012-01-17 NOTE — ED Notes (Signed)
Pt arrived by [facility] for acid reflux after eating pizza last night. Burning sensation to chest started at 0400. cbg 69 pta, given oral glucose gel and it increased to 90.

## 2012-01-18 ENCOUNTER — Ambulatory Visit (INDEPENDENT_AMBULATORY_CARE_PROVIDER_SITE_OTHER): Payer: Medicare HMO | Admitting: Internal Medicine

## 2012-01-18 ENCOUNTER — Encounter: Payer: Self-pay | Admitting: Internal Medicine

## 2012-01-18 VITALS — BP 140/72 | HR 72 | Temp 98.4°F | Ht 64.0 in | Wt 139.8 lb

## 2012-01-18 DIAGNOSIS — E119 Type 2 diabetes mellitus without complications: Secondary | ICD-10-CM

## 2012-01-18 DIAGNOSIS — K219 Gastro-esophageal reflux disease without esophagitis: Secondary | ICD-10-CM

## 2012-01-18 DIAGNOSIS — E785 Hyperlipidemia, unspecified: Secondary | ICD-10-CM

## 2012-01-18 DIAGNOSIS — B370 Candidal stomatitis: Secondary | ICD-10-CM

## 2012-01-18 DIAGNOSIS — B3781 Candidal esophagitis: Secondary | ICD-10-CM

## 2012-01-18 MED ORDER — MAGIC MOUTHWASH W/LIDOCAINE: 5.0000 mL | Freq: Four times a day (QID) | ORAL | Status: DC

## 2012-01-18 NOTE — Assessment & Plan Note (Signed)
Follows with endo for same - lantus bid and humalog ssi - Last a1c at goal per pt The current medical regimen is effective;  continue present plan and medications.  Lab Results  Component Value Date   HGBA1C 7.6* 11/16/2010   

## 2012-01-18 NOTE — Patient Instructions (Signed)
It was good to see you today. Use magic mouthwash 4x/day swish and swallow x 1 week, then as needed Increase Nexium to 2x/day as directed, then resume once daily Other Medications reviewed, no other changes at this time.

## 2012-01-18 NOTE — Assessment & Plan Note (Signed)
On statin - labs followed by endo The current medical regimen is effective;  continue present plan and medications. Last lipids reviewed - at goal

## 2012-01-18 NOTE — Assessment & Plan Note (Signed)
Increase PPI to BID x 30days, then resume qd -  reminded of importance of compliance and need for diet adherence and smoking cessation

## 2012-01-18 NOTE — Progress Notes (Signed)
Subjective:    Patient ID: Leslie Lane, female    DOB: 19-Dec-1955, 55 y.o.   MRN: [id number]  HPI  Here for follow up - reviewed chronic medical issues today:  GERD - increase burning symptoms - went ER for same SSCP/burn yesterday, but left before being seen - precipitated by pizza - not relieved with nexium, liquds and solids cause burning symptoms - no nausea and vomiting. No diarrhea  neck and back pain - chronic - FM aches to sleep and aches do nothing- pain worse with any movement no injury or trauma hx for spinal pain onset symptoms early 2010 no radiation of pain or numbness - xray c and l spine 07/2010 - mild DDD - reviewed with pt now on tramadol and reduced gabapentin because of sedation   DM2 - insulin dep - follows with endo (kumar) for same - denies hx hypoglycemic signs or symptoms since summer 2011 (ER visit for same reviewed) - checks sugars 2-3x/d  depression, severe - hx - previously followed with psyc and mental health for same, missed last few appts, requests refills here - reports compliance with ongoing medical treatment and no changes in medication dose or frequency. denies adverse side effects related to current therapy. no SI/HI  migraines - follows with [facility] for same ([facility]) - no change in meds  dyslipidemia - on statin for same since 2010- reports compliance with ongoing medical treatment and no changes in medication dose or frequency. denies adverse side effects related to current therapy.    Past Medical History  Diagnosis Date  . HEPATITIS B   . BIPOLAR AFFECTIVE DISORDER   . SMOKER   . MIGRAINE HEADACHE   . NECK PAIN, CHRONIC   . LOW BACK PAIN, CHRONIC   . ALLERGIC RHINITIS   . HYPERLIPIDEMIA   . GERD   . DIABETES MELLITUS, TYPE II   . DEPRESSION   . NARCOTIC ABUSE     Review of Systems  Constitutional: Negative for fever and fatigue.  Respiratory: Negative for cough and shortness of breath.   Cardiovascular: Negative for chest  pain and leg swelling.  Neurological: Negative for syncope and headaches.       Objective:   Physical Exam  BP 140/72  Pulse 72  Temp 98.4 F (36.9 C) (Oral)  Ht 5\' 4"  (1.626 m)  Wt 139 lb 12.8 oz (63.413 kg)  BMI 24.00 kg/m2  SpO2 96% Wt Readings from Last 3 Encounters:  01/18/12 139 lb 12.8 oz (63.413 kg)  11/23/11 146 lb 12.8 oz (66.588 kg)  09/07/11 137 lb 14.4 oz (62.551 kg)   Constitutional: She is well-developed and well-nourished. No distress.  HENT: OP with erythema, mild thrush, no vesicles Neck: Normal range of motion. Neck supple. No JVD or LAD present. No thyromegaly present.  Cardiovascular: Normal rate, regular rhythm and normal heart sounds.  No murmur heard. No BLE edema. Pulmonary/Chest: Effort normal and breath sounds normal. No respiratory distress. She has no wheezes.  Abdomen - S, NT, ND, +BS, no R/G Psychiatric: She has expansive mood and affect. Her behavior is normal. Judgment and thought content normal.   Lab Results  Component Value Date   WBC 8.2 07/16/2007   HGB 12.2 04/25/2011   HCT 36.0 04/25/2011   PLT 306 07/16/2007   CHOL 191 11/16/2010   TRIG 57.0 11/16/2010   HDL 75.20 11/16/2010   ALT 16 07/16/2007   AST 22 07/16/2007   NA 142 04/25/2011   K 3.7 04/25/2011  CL 106 04/25/2011   CREATININE 0.90 04/25/2011   BUN 7 04/25/2011   CO2 17 01/31/2010   HGBA1C 7.6* 11/16/2010   No results found for this basename: VITAMINB12        Assessment & Plan:  See problem list. Medications and labs reviewed today.  Odynophagia, suspect thrush as recently on antibiotics for dental procedure/extraction - complicated by uncontrolled GERD - tx magic mouthwash QID and resume BID PPI x 1 month

## 2012-02-09 ENCOUNTER — Telehealth: Payer: Self-pay | Admitting: Internal Medicine

## 2012-02-09 MED ORDER — TRAMADOL HCL 50 MG PO TABS: 100.0000 mg | ORAL_TABLET | Freq: Three times a day (TID) | ORAL | Status: DC | PRN

## 2012-02-09 NOTE — Telephone Encounter (Signed)
Notified pt rx sent to pharmacy... 02/09/12@11 :44am/LMB

## 2012-02-09 NOTE — Telephone Encounter (Signed)
The pt called the triage line hoping to get a refill of Tramadol sent to [facility] on [address].  Thanks!

## 2012-03-26 ENCOUNTER — Ambulatory Visit (INDEPENDENT_AMBULATORY_CARE_PROVIDER_SITE_OTHER): Payer: Medicare HMO | Admitting: Internal Medicine

## 2012-03-26 ENCOUNTER — Encounter: Payer: Self-pay | Admitting: Internal Medicine

## 2012-03-26 ENCOUNTER — Other Ambulatory Visit (INDEPENDENT_AMBULATORY_CARE_PROVIDER_SITE_OTHER): Payer: Medicare HMO

## 2012-03-26 VITALS — BP 128/78 | HR 75 | Temp 97.6°F | Resp 16 | Wt 147.5 lb

## 2012-03-26 DIAGNOSIS — Z23 Encounter for immunization: Secondary | ICD-10-CM

## 2012-03-26 DIAGNOSIS — E785 Hyperlipidemia, unspecified: Secondary | ICD-10-CM

## 2012-03-26 DIAGNOSIS — E119 Type 2 diabetes mellitus without complications: Secondary | ICD-10-CM

## 2012-03-26 DIAGNOSIS — B1910 Unspecified viral hepatitis B without hepatic coma: Secondary | ICD-10-CM

## 2012-03-26 DIAGNOSIS — M545 Low back pain: Secondary | ICD-10-CM

## 2012-03-26 LAB — LIPID PANEL
Cholesterol: 207 mg/dL — ABNORMAL HIGH (ref 0–200)
Total CHOL/HDL Ratio: 2
VLDL: 9.8 mg/dL (ref 0.0–40.0)

## 2012-03-26 LAB — HEPATIC FUNCTION PANEL
ALT: 23 U/L (ref 0–35)
AST: 30 U/L (ref 0–37)
Bilirubin, Direct: 0.1 mg/dL (ref 0.0–0.3)
Total Protein: 7.2 g/dL (ref 6.0–8.3)

## 2012-03-26 LAB — HEPATITIS B SURFACE ANTIGEN: Hepatitis B Surface Ag: NEGATIVE

## 2012-03-26 LAB — HEPATITIS B SURFACE ANTIBODY,QUALITATIVE: Hep B S Ab: POSITIVE — AB

## 2012-03-26 MED ORDER — LOSARTAN POTASSIUM 25 MG PO TABS: 25.0000 mg | ORAL_TABLET | Freq: Every day | ORAL | Status: DC

## 2012-03-26 NOTE — Assessment & Plan Note (Signed)
Underlying DDD with overlapping fibromyalgia  07/2010 xray with mild ddd in neck on muscle relaxant for migraines and fibromyalgia Consider change to robaxin for less sedation than baclofen at future visit if needed uses tramadol prn and also avoid narcotics given hx abuse and med sensitivity (sedation) - Trial meloxicam caused nausea - stopped same 

## 2012-03-26 NOTE — Assessment & Plan Note (Signed)
Follows with endo for same - lantus bid and humalog ssi (rarely needs same)- Last a1c at goal per pt On statin, add ARB now  Lab Results  Component Value Date   HGBA1C 7.6* 11/16/2010

## 2012-03-26 NOTE — Progress Notes (Signed)
Subjective:    Patient ID: Leslie Lane, female    DOB: [birth year], 56 y.o.   MRN: [id number]  HPI  Here for follow up - reviewed chronic medical issues today:  neck and back pain - chronic, related to ddd and fibromyalgia  aches to sleep and aches do nothing- pain worse with any movement  no injury or trauma hx for spinal pain onset symptoms early 2010 no radiation of pain or numbness - xray c and l spine 07/2010 - mild DDD -  on tramadol as intolerant of meloxicam due to nausea side effects  reduced gabapentin because of sedation   DM2 - insulin dep - follows with endo (kumar) for same - denies hx hypoglycemic signs or symptoms since summer 2011 (ER visit for same reviewed) - checks sugars 2-3x/d  depression, severe - history of same - intermittently follows with psyc and mental health for same - reports compliance with ongoing medical treatment and no changes in medication dose or frequency. denies adverse side effects related to current therapy. no SI/HI  migraines - follows with [facility] clinic for same (hagan) - no change in meds  dyslipidemia - on statin for same since 2010- reports compliance with ongoing medical treatment and no changes in medication dose or frequency. denies adverse side effects related to current therapy.    Past Medical History  Diagnosis Date  . HEPATITIS B   . BIPOLAR AFFECTIVE DISORDER   . SMOKER   . MIGRAINE HEADACHE   . NECK PAIN, CHRONIC   . LOW BACK PAIN, CHRONIC   . ALLERGIC RHINITIS   . HYPERLIPIDEMIA   . GERD   . DIABETES MELLITUS, TYPE II     follows with endo  . DEPRESSION   . NARCOTIC ABUSE     Review of Systems  Constitutional: Negative for fever and fatigue.  Respiratory: Negative for cough and shortness of breath.   Cardiovascular: Negative for chest pain and leg swelling.  Neurological: Negative for syncope and headaches.       Objective:   Physical Exam  BP 128/78  Pulse 75  Temp 97.6 F (36.4 C) (Oral)  Resp  16  Wt 147 lb 8 oz (66.906 kg)  SpO2 95% Wt Readings from Last 3 Encounters:  03/26/12 147 lb 8 oz (66.906 kg)  01/18/12 139 lb 12.8 oz (63.413 kg)  11/23/11 146 lb 12.8 oz (66.588 kg)   Constitutional: She is well-developed and well-nourished. No distress.  Neck: Normal range of motion. Neck supple. No JVD or LAD present. No thyromegaly present.  Cardiovascular: Normal rate, regular rhythm and normal heart sounds.  No murmur heard. No BLE edema. Pulmonary/Chest: Effort normal and breath sounds normal. No respiratory distress. She has no wheezes.  MSkel: Back: full range of motion of thoracic and lumbar spine. Non tender to palpation. Negative straight leg raise. DTR's are symmetrically intact. Sensation intact in all dermatomes of the lower extremities. Full strength to manual muscle testing. patient is able to heel toe walk without difficulty and ambulates without antalgic gait. Psychiatric: She has expansive mood and affect. Her behavior is normal. Judgment and thought content normal.   Lab Results  Component Value Date   WBC 8.2 07/16/2007   HGB 12.2 04/25/2011   HCT 36.0 04/25/2011   PLT 306 07/16/2007   CHOL 191 11/16/2010   TRIG 57.0 11/16/2010   HDL 75.20 11/16/2010   ALT 16 07/16/2007   AST 22 07/16/2007   NA 142 04/25/2011   K 3.7  04/25/2011   CL 106 04/25/2011   CREATININE 0.90 04/25/2011   BUN 7 04/25/2011   CO2 17 01/31/2010   HGBA1C 7.6* 11/16/2010        Assessment & Plan:  See problem list. Medications and labs reviewed today.

## 2012-03-26 NOTE — Assessment & Plan Note (Signed)
On statin -  The current medical regimen is effective;  continue present plan and medications. Last lipids reviewed - at goal

## 2012-03-26 NOTE — Patient Instructions (Signed)
It was good to see you today. Medications reviewed, start low dose losartan for your kidneys and blood pressure - Your prescription(s) have been submitted to your pharmacy. Please take as directed and contact our office if you believe you are having problem(s) with the medication(s). No other medication changes Test(s) ordered today. Your results will be called to you or posted to MyChart after review (48-72hours after test completion). If any changes need to be made, you will be notified at same time. Flu shot done today Please schedule followup in 6 months, call sooner if problems.

## 2012-03-26 NOTE — Assessment & Plan Note (Signed)
Hx infection in 1980s - no residual sequela Check for HIV, Hep C and LFTs now

## 2012-03-28 ENCOUNTER — Other Ambulatory Visit: Payer: Self-pay | Admitting: Internal Medicine

## 2012-03-28 DIAGNOSIS — B182 Chronic viral hepatitis C: Secondary | ICD-10-CM

## 2012-03-29 ENCOUNTER — Ambulatory Visit: Payer: Medicare HMO

## 2012-03-29 DIAGNOSIS — B182 Chronic viral hepatitis C: Secondary | ICD-10-CM

## 2012-05-20 ENCOUNTER — Other Ambulatory Visit: Payer: Self-pay | Admitting: Internal Medicine

## 2012-06-24 ENCOUNTER — Other Ambulatory Visit: Payer: Self-pay | Admitting: Internal Medicine

## 2012-08-13 ENCOUNTER — Other Ambulatory Visit: Payer: Self-pay | Admitting: Internal Medicine

## 2012-09-16 ENCOUNTER — Other Ambulatory Visit: Payer: Self-pay | Admitting: Internal Medicine

## 2012-09-24 ENCOUNTER — Other Ambulatory Visit (INDEPENDENT_AMBULATORY_CARE_PROVIDER_SITE_OTHER): Payer: Medicare HMO

## 2012-09-24 ENCOUNTER — Encounter: Payer: Self-pay | Admitting: Internal Medicine

## 2012-09-24 ENCOUNTER — Ambulatory Visit (INDEPENDENT_AMBULATORY_CARE_PROVIDER_SITE_OTHER): Payer: Medicare HMO | Admitting: Internal Medicine

## 2012-09-24 VITALS — BP 128/68 | HR 119 | Temp 98.1°F | Wt 140.1 lb

## 2012-09-24 DIAGNOSIS — E119 Type 2 diabetes mellitus without complications: Secondary | ICD-10-CM

## 2012-09-24 DIAGNOSIS — IMO0002 Reserved for concepts with insufficient information to code with codable children: Secondary | ICD-10-CM

## 2012-09-24 DIAGNOSIS — F319 Bipolar disorder, unspecified: Secondary | ICD-10-CM

## 2012-09-24 DIAGNOSIS — M545 Low back pain: Secondary | ICD-10-CM

## 2012-09-24 DIAGNOSIS — E1065 Type 1 diabetes mellitus with hyperglycemia: Secondary | ICD-10-CM

## 2012-09-24 DIAGNOSIS — F17200 Nicotine dependence, unspecified, uncomplicated: Secondary | ICD-10-CM

## 2012-09-24 LAB — MICROALBUMIN / CREATININE URINE RATIO: Microalb, Ur: 3.6 mg/dL — ABNORMAL HIGH (ref 0.0–1.9)

## 2012-09-24 LAB — HEMOGLOBIN A1C: Hgb A1c MFr Bld: 7.6 % — ABNORMAL HIGH (ref 4.6–6.5)

## 2012-09-24 MED ORDER — TRAMADOL HCL 50 MG PO TABS: 100.0000 mg | ORAL_TABLET | Freq: Three times a day (TID) | ORAL | Status: DC | PRN

## 2012-09-24 MED ORDER — TRAZODONE HCL 100 MG PO TABS: 100.0000 mg | ORAL_TABLET | Freq: Every day | ORAL | Status: DC

## 2012-09-24 NOTE — Assessment & Plan Note (Signed)
5 minutes today spent counseling patient on unhealthy effects of continued tobacco abuse and encouragement of cessation including medical options available to help the patient quit smoking. 

## 2012-09-24 NOTE — Assessment & Plan Note (Signed)
Underlying DDD with overlapping fibromyalgia  07/2010 xray with mild ddd in neck on muscle relaxant for migraines and fibromyalgia Consider change to robaxin for less sedation than baclofen at future visit if needed uses tramadol prn and also avoid narcotics given hx abuse and med sensitivity (sedation) - Trial meloxicam caused nausea - stopped same

## 2012-09-24 NOTE — Patient Instructions (Signed)
It was good to see you today. Medications reviewed, no changes at this time. Refill on medication(s) as discussed today. Test(s) ordered today. Your results will be called to you or posted to MyChart after review (48-72hours after test completion). If any changes need to be made, you will be notified at same time. Please schedule followup in 6 months, call sooner if problems.

## 2012-09-24 NOTE — Assessment & Plan Note (Signed)
Follows with endo (kumar) for same - lantus bid and humalog ssi (rarely needs same)- Check a1c now and q 78mo as needed On statin, add ARB now  Lab Results  Component Value Date   HGBA1C 7.6* 11/16/2010

## 2012-09-24 NOTE — Progress Notes (Signed)
  Subjective:    Patient ID: Leslie Lane, female    DOB: Oct 15, 1955, 57 y.o.   MRN: [id number]  HPI  Here for follow up - reviewed chronic medical issues today:  neck and back pain - chronic, related to ddd and fibromyalgia  aches to sleep and aches do nothing- pain worse with any movement  no injury or trauma hx for spinal pain onset symptoms early 2010 no radiation of pain or numbness - xray c and l spine 07/2010 - mild DDD -  on tramadol as intolerant of meloxicam due to nausea side effects  reduced gabapentin because of sedation   DM2 - insulin dep - follows with endo (kumar) for same - denies hx hypoglycemic signs or symptoms since summer 2011 (ER visit for same reviewed) - checks sugars 2-3x/d  depression, severe - history of same - intermittently follows with psyc and mental health for same - reports compliance with ongoing medical treatment and no changes in medication dose or frequency. denies adverse side effects related to current therapy. no SI/HI  migraines - follows with [facility] clinic for same (hagan) - no change in meds  dyslipidemia - on statin for same since 2010- reports compliance with ongoing medical treatment and no changes in medication dose or frequency. denies adverse side effects related to current therapy.    Past Medical History  Diagnosis Date  . HEPATITIS B   . BIPOLAR AFFECTIVE DISORDER   . SMOKER   . MIGRAINE HEADACHE   . NECK PAIN, CHRONIC   . LOW BACK PAIN, CHRONIC   . ALLERGIC RHINITIS   . HYPERLIPIDEMIA   . GERD   . DIABETES MELLITUS, TYPE II     follows with endo  . DEPRESSION   . NARCOTIC ABUSE     Review of Systems  Constitutional: Negative for fever and fatigue.  Respiratory: Negative for cough and shortness of breath.   Cardiovascular: Negative for chest pain and leg swelling.  Neurological: Negative for syncope and headaches.       Objective:   Physical Exam  BP 128/68  Pulse 119  Temp(Src) 98.1 F (36.7 C) (Oral)   Wt 140 lb 1.9 oz (63.558 kg)  BMI 24.04 kg/m2  SpO2 95% Wt Readings from Last 3 Encounters:  09/24/12 140 lb 1.9 oz (63.558 kg)  03/26/12 147 lb 8 oz (66.906 kg)  01/18/12 139 lb 12.8 oz (63.413 kg)   Constitutional: She is well-developed and well-nourished. No distress.  Neck: Normal range of motion. Neck supple. No JVD or LAD present. No thyromegaly present.  Cardiovascular: Normal rate, regular rhythm and normal heart sounds.  No murmur heard. No BLE edema. Pulmonary/Chest: Effort normal and breath sounds normal. No respiratory distress. She has no wheezes Psychiatric: She has expansive mood and affect. Her behavior is normal. Judgment and thought content normal.   Lab Results  Component Value Date   WBC 8.2 07/16/2007   HGB 12.2 04/25/2011   HCT 36.0 04/25/2011   PLT 306 07/16/2007   CHOL 207* 03/26/2012   TRIG 49.0 03/26/2012   HDL 90.60 03/26/2012   LDLDIRECT 101.7 03/26/2012   ALT 23 03/26/2012   AST 30 03/26/2012   NA 142 04/25/2011   K 3.7 04/25/2011   CL 106 04/25/2011   CREATININE 0.90 04/25/2011   BUN 7 04/25/2011   CO2 17 01/31/2010   HGBA1C 7.6* 11/16/2010        Assessment & Plan:  See problem list. Medications and labs reviewed today.

## 2012-09-24 NOTE — Assessment & Plan Note (Signed)
Encouraged to follow up with Dr. Betti Cruz (psyc) for same q4-62mo - meds reviewed - symptoms generally stable Refill now

## 2013-01-31 ENCOUNTER — Other Ambulatory Visit: Payer: Self-pay | Admitting: *Deleted

## 2013-01-31 ENCOUNTER — Other Ambulatory Visit: Payer: Self-pay | Admitting: Endocrinology

## 2013-01-31 MED ORDER — GLUCAGON (RDNA) 1 MG IJ KIT: PACK | INTRAMUSCULAR | Status: DC

## 2013-02-06 ENCOUNTER — Encounter: Payer: Self-pay | Admitting: Endocrinology

## 2013-02-06 ENCOUNTER — Ambulatory Visit (INDEPENDENT_AMBULATORY_CARE_PROVIDER_SITE_OTHER): Payer: Medicare HMO | Admitting: Endocrinology

## 2013-02-06 VITALS — BP 118/64 | HR 75 | Temp 98.4°F | Resp 10 | Ht 64.0 in | Wt 139.0 lb

## 2013-02-06 DIAGNOSIS — E108 Type 1 diabetes mellitus with unspecified complications: Secondary | ICD-10-CM

## 2013-02-06 DIAGNOSIS — R5381 Other malaise: Secondary | ICD-10-CM

## 2013-02-06 DIAGNOSIS — E1065 Type 1 diabetes mellitus with hyperglycemia: Secondary | ICD-10-CM | POA: Insufficient documentation

## 2013-02-06 DIAGNOSIS — IMO0002 Reserved for concepts with insufficient information to code with codable children: Secondary | ICD-10-CM

## 2013-02-06 HISTORY — DX: Type 1 diabetes mellitus with unspecified complications: E10.8

## 2013-02-06 LAB — COMPREHENSIVE METABOLIC PANEL
AST: 17 U/L (ref 0–37)
Albumin: 3.8 g/dL (ref 3.5–5.2)
BUN: 15 mg/dL (ref 6–23)
CO2: 31 mEq/L (ref 19–32)
Calcium: 9.5 mg/dL (ref 8.4–10.5)
Chloride: 103 mEq/L (ref 96–112)
Creatinine, Ser: 0.8 mg/dL (ref 0.4–1.2)
GFR: 76.97 mL/min (ref >60.00)
Potassium: 4.4 mEq/L (ref 3.5–5.1)

## 2013-02-06 LAB — LIPID PANEL
Cholesterol: 180 mg/dL (ref 0–200)
HDL: 70.4 mg/dL (ref >39.00)
Total CHOL/HDL Ratio: 3
Triglycerides: 76 mg/dL (ref 0.0–149.0)

## 2013-02-06 LAB — TSH: TSH: 0.94 u[IU]/mL (ref 0.35–5.50)

## 2013-02-06 NOTE — Progress Notes (Signed)
Patient ID: Leslie Lane, female   DOB: Jan 13, 1956, 57 y.o.   MRN: [id number]  Leslie Lane is an 57 y.o. female.   Reason for Appointment: Diabetes follow-up   History of Present Illness   Diagnosis: Type 1 DIABETES MELITUS, long-standing     Insulin regimen: Lantus 10 units a.m. And 8 units h.s.[place] Humalog 1-3 p.r.n.  She has had labile blood sugar control over the last several years even though A1c has been usually around 7% She has done somewhat better with taking b.i.d. Lantus compared once a day She has been very sensitive to fast acting insulin and does not always require mealtime coverage  More recently has had very frequent hypoglycemia including some severe episodes requiring assistance She is now taking Humalog insulin even for moderately increased blood sugars below 200 and relatively small carbohydrate intake. Most of her lower readings are at bedtime but not waking up with low readings. She has a wide range of the morning readings; some high readings may be related to rebound from hypoglycemia She is also generally more active in the last few days around the house DIET: She is usually eating balanced meals but sometimes eating more snacks. Recently trying to avoid high-fat meals. May eat more when she is anxious  Proper timing of medications in relation to meals: Yes.         Monitors blood glucose: Once a day.    Glucometer: One Touch.          Blood Glucose readings from meter download: readings before breakfast:fasting median186, recently 75-211 LUNCHTIME and suppertime median 113, recently 46-285 and lunch and, suppertime 54-239. H.s. Median 41, range 24-271 Around midnight range 61-144  HYPOGLYCEMIA: she has 37% of readings below 70 and low readings almost daily, sometimes multiple times.       COMPLICATIONS: No history of neuropathy, nephropathy retinopathy      Lab Results  Component Value Date   MICROALBUR 3.6* 09/24/2012     Wt Readings from Last 3  Encounters:  02/06/13 139 lb (63.05 kg)  09/24/12 140 lb 1.9 oz (63.558 kg)  03/26/12 147 lb 8 oz (66.906 kg)       Medication List       This list is accurate as of: 02/06/13 11:31 AM.  Always use your most recent med list.               esomeprazole 40 MG capsule  Commonly known as:  NEXIUM  Take 40 mg by mouth daily before breakfast.     glucagon 1 MG injection  Commonly known as:  GLUCAGON EMERGENCY  TAKE AS DIRECTED     HUMALOG 100 UNIT/ML injection  Generic drug:  insulin lispro  Inject into the skin 3 (three) times daily before meals. Sliding scale     LANTUS 100 UNIT/ML injection  Generic drug:  insulin glargine  Inject 6-10 Units into the skin 2 (two) times daily. Take 10 units in the am, and 6-8 units at supper     losartan 25 MG tablet  Commonly known as:  COZAAR  TAKE 1 TABLET BY MOUTH DAILY     multivitamin tablet  Take 1 tablet by mouth daily.     ONE TOUCH ULTRA TEST test strip  Generic drug:  glucose blood     simvastatin 20 MG tablet  Commonly known as:  ZOCOR  Take 20 mg by mouth at bedtime.     traMADol 50 MG tablet  Commonly known as:  ULTRAM  Take 2 tablets (100 mg total) by mouth every 8 (eight) hours as needed for pain.     traZODone 100 MG tablet  Commonly known as:  DESYREL  Take 1-2 tablets (100-200 mg total) by mouth at bedtime.     venlafaxine XR 75 MG 24 hr capsule  Commonly known as:  EFFEXOR-XR  Take 1 and a half tablet everyday     venlafaxine 37.5 MG tablet  Commonly known as:  EFFEXOR  Take 37.5 mg by mouth 2 (two) times daily.     vitamin A 21308 UNIT capsule  Take 10,000 Units by mouth daily.     vitamin B-12 1000 MCG tablet  Commonly known as:  CYANOCOBALAMIN  Take 1,000 mcg by mouth daily.     vitamin E 1000 UNIT capsule  Generic drug:  vitamin E  Take 1,000 Units by mouth daily.        Allergies:  Allergies  Allergen Reactions  . Penicillins   . Sulfonamide Derivatives     Past Medical History   Diagnosis Date  . HEPATITIS B   . BIPOLAR AFFECTIVE DISORDER   . SMOKER   . MIGRAINE HEADACHE   . NECK PAIN, CHRONIC   . LOW BACK PAIN, CHRONIC   . ALLERGIC RHINITIS   . HYPERLIPIDEMIA   . GERD   . DIABETES MELLITUS, TYPE II     follows with endo  . DEPRESSION   . NARCOTIC ABUSE     Past Surgical History  Procedure Laterality Date  . Cesarean section      Family History  Problem Relation Age of Onset  . Arthritis Mother   . Arthritis Father   . Kidney disease Father   . Diabetes Other     parent  . Hyperlipidemia Other     parent  . Kidney disease Other     aunt    Social History:  reports that she has been smoking Cigarettes.  She has been smoking about 0.00 packs per day for the past 40 years. She does not have any smokeless tobacco history on file. She reports that  drinks alcohol. She reports that she does not use illicit drugs.  Review of Systems:  DEPRESSION: She has had long-standing depression and anxiety followed by psychiatrist   HYPERLIPIDEMIA: The lipid abnormality consists of elevated LDL. Compliant with simvastatin She has been treated with low-dose Cozaar by PCP, no history of hypertension or microalbuminuria No history of edema Has history of small goiter but no hypothyroidism      Examination:   BP 118/64  Pulse 75  Temp(Src) 98.4 F (36.9 C)  Resp 10  Ht 5\' 4"  (1.626 m)  Wt 139 lb (63.05 kg)  BMI 23.85 kg/m2  SpO2 99%  Body mass index is 23.85 kg/(m^2).   Thyroid not palpable No pedal edema  ASSESSMENT/ PLAN::   Diabetes type 1  The patient's diabetes control appears to be complicated by frequent hypoglycemia which is likely to be from taking frequent doses of fast acting insulin for her small meals are modestly increased blood sugars. She gets anxious when blood sugars are high and will take Humalog She is also most likely getting excessive Lantus in the morning with some tendency to low sugar is mid day and afternoon She does  not know how to adjust her Lantus based on before breakfast or before supper readings She was given guidelines on how to adjust Lantus and when to use mealtime insulin and correction doses  She will only take correction doses and blood sugars are over 200 except for coverage at bedtime Prescription for glucagon to be sent  ? Hypertension: Her pressure is low normal and may consider stopping losartan  Timotheus Salm 02/06/2013, 11:31 AM   Office Visit on 02/06/2013  Component Date Value Range Status  . Cholesterol 02/06/2013 180  0 - 200 mg/dL Final   ATP III Classification       Desirable:  < 200 mg/dL               Borderline High:  200 - 239 mg/dL          High:  > = 161 mg/dL  . Triglycerides 02/06/2013 76.0  0.0 - 149.0 mg/dL Final   Normal:  <096 mg/dLBorderline High:  150 - 199 mg/dL  . HDL 02/06/2013 70.40  >39.00 mg/dL Final  . VLDL 04/54/0981 15.2  0.0 - 40.0 mg/dL Final  . LDL Cholesterol 02/06/2013 94  0 - 99 mg/dL Final  . Total CHOL/HDL Ratio 02/06/2013 3   Final                  Men          Women1/2 Average Risk     3.4          3.3Average Risk          5.0          4.42X Average Risk          9.6          7.13X Average Risk          15.0          11.0                      . Hemoglobin A1C 02/06/2013 7.4* 4.6 - 6.5 % Final   Glycemic Control Guidelines for People with Diabetes:Non Diabetic:  <6%Goal of Therapy: <7%Additional Action Suggested:  >8%   . Sodium 02/06/2013 139  135 - 145 mEq/L Final  . Potassium 02/06/2013 4.4  3.5 - 5.1 mEq/L Final  . Chloride 02/06/2013 103  96 - 112 mEq/L Final  . CO2 02/06/2013 31  19 - 32 mEq/L Final  . Glucose, Bld 02/06/2013 82  70 - 99 mg/dL Final  . BUN 19/14/7829 15  6 - 23 mg/dL Final  . Creatinine, Ser 02/06/2013 0.8  0.4 - 1.2 mg/dL Final  . Total Bilirubin 02/06/2013 0.2* 0.3 - 1.2 mg/dL Final  . Alkaline Phosphatase 02/06/2013 51  39 - 117 U/L Final  . AST 02/06/2013 17  0 - 37 U/L Final  . ALT 02/06/2013 13  0 - 35 U/L Final  .  Total Protein 02/06/2013 6.7  6.0 - 8.3 g/dL Final  . Albumin 56/21/3086 3.8  3.5 - 5.2 g/dL Final  . Calcium 57/84/6962 9.5  8.4 - 10.5 mg/dL Final  . GFR 95/28/4132 76.97  >60.00 mL/min Final  . Free T4 02/06/2013 0.84  0.60 - 1.60 ng/dL Final  . TSH 44/07/270 0.94  0.35 - 5.50 uIU/mL Final

## 2013-02-06 NOTE — Patient Instructions (Addendum)
LANTUS 9 UNITS IN AND 8 AT 9-10PM  DO NOT TAKE HUMALOG UNLESS EATING 3 OR MORE STARCHES OR Sugar >200 before meal No Humalog at bedtime

## 2013-02-09 MED ORDER — GLUCAGON (RDNA) 1 MG IJ KIT: PACK | INTRAMUSCULAR | Status: DC

## 2013-02-28 ENCOUNTER — Telehealth: Payer: Self-pay | Admitting: *Deleted

## 2013-02-28 ENCOUNTER — Ambulatory Visit (INDEPENDENT_AMBULATORY_CARE_PROVIDER_SITE_OTHER): Payer: Medicare HMO | Admitting: Internal Medicine

## 2013-02-28 ENCOUNTER — Encounter: Payer: Self-pay | Admitting: Internal Medicine

## 2013-02-28 VITALS — BP 130/72 | HR 93 | Temp 98.5°F | Wt 141.2 lb

## 2013-02-28 DIAGNOSIS — IMO0002 Reserved for concepts with insufficient information to code with codable children: Secondary | ICD-10-CM

## 2013-02-28 DIAGNOSIS — M65839 Other synovitis and tenosynovitis, unspecified forearm: Secondary | ICD-10-CM

## 2013-02-28 DIAGNOSIS — I8390 Asymptomatic varicose veins of unspecified lower extremity: Secondary | ICD-10-CM

## 2013-02-28 DIAGNOSIS — Z23 Encounter for immunization: Secondary | ICD-10-CM

## 2013-02-28 DIAGNOSIS — E1065 Type 1 diabetes mellitus with hyperglycemia: Secondary | ICD-10-CM

## 2013-02-28 DIAGNOSIS — M65842 Other synovitis and tenosynovitis, left hand: Secondary | ICD-10-CM

## 2013-02-28 MED ORDER — IBUPROFEN 600 MG PO TABS: 600.0000 mg | ORAL_TABLET | Freq: Four times a day (QID) | ORAL | Status: DC | PRN

## 2013-02-28 NOTE — Assessment & Plan Note (Signed)
Follows with endo (kumar) for same - lantus bid and humalog ssi (rarely needs same)- Check a1c q 30mo as needed On statin and ARB  Lab Results  Component Value Date   HGBA1C 7.4* 02/06/2013

## 2013-02-28 NOTE — Telephone Encounter (Signed)
15/20 is fine

## 2013-02-28 NOTE — Progress Notes (Signed)
Subjective:    Patient ID: Leslie Lane, female    DOB: 08-30-1955, 57 y.o.   MRN: [id number]  HPI Here for follow up - reviewed chronic medical issues today:  neck and back pain - chronic, related to ddd and fibromyalgia  aches to sleep and aches do nothing- pain worse with any movement  no injury or trauma hx for spinal pain onset symptoms early 2010 no radiation of pain or numbness - xray c and l spine 07/2010 - mild DDD -  on tramadol as intolerant of meloxicam due to nausea side effects  reduced gabapentin because of sedation   DM2 - insulin dep - follows with endo (kumar) for same - denies hx hypoglycemic signs or symptoms since summer 2011 (ER visit for same reviewed) - checks sugars 2-3x/d  depression, severe - history of same - intermittently follows with psyc and mental health for same - reports compliance with ongoing medical treatment and no changes in medication dose or frequency. denies adverse side effects related to current therapy. no SI/HI  migraines - follows with headache clinic for same (hagan) - no change in meds  dyslipidemia - on statin for same since 2010- reports compliance with ongoing medical treatment and no changes in medication dose or frequency. denies adverse side effects related to current therapy.   complains of pain and "locking" x 2 months, L pinky - no trauma - hx trigger thumb tx with injection years ago  Past Medical History  Diagnosis Date  . HEPATITIS B   . BIPOLAR AFFECTIVE DISORDER   . SMOKER   . MIGRAINE HEADACHE   . NECK PAIN, CHRONIC   . LOW BACK PAIN, CHRONIC   . ALLERGIC RHINITIS   . HYPERLIPIDEMIA   . GERD   . DIABETES MELLITUS, TYPE II     follows with endo  . DEPRESSION   . NARCOTIC ABUSE     hx of    Review of Systems  Constitutional: Negative for fever and fatigue.  Respiratory: Negative for cough and shortness of breath.   Cardiovascular: Negative for chest pain and leg swelling.  Neurological: Negative for  syncope and headaches.       Objective:   Physical Exam BP 130/72  Pulse 93  Temp(Src) 98.5 F (36.9 C) (Oral)  Wt 141 lb 3.2 oz (64.048 kg)  BMI 24.23 kg/m2  SpO2 97% Wt Readings from Last 3 Encounters:  02/28/13 141 lb 3.2 oz (64.048 kg)  02/06/13 139 lb (63.05 kg)  09/24/12 140 lb 1.9 oz (63.558 kg)   Constitutional: She is well-developed and well-nourished. No distress. mom at side Neck: Normal range of motion. Neck supple. No JVD or LAD present. No thyromegaly present.  Cardiovascular: Normal rate, regular rhythm and normal heart sounds.  No murmur heard. No BLE edema, but +varicose veins distally. Pulmonary/Chest: Effort normal and breath sounds normal. No respiratory distress. She has no wheezes MSkel: Left 5th finger: tender at a1 pulley - triggers with AROM, no swelling, NV intact, no signs infection Psychiatric: She has expansive mood and affect. Her behavior is normal. Judgment and thought content normal.   Lab Results  Component Value Date   WBC 8.2 07/16/2007   HGB 12.2 04/25/2011   HCT 36.0 04/25/2011   PLT 306 07/16/2007   CHOL 180 02/06/2013   TRIG 76.0 02/06/2013   HDL 70.40 02/06/2013   LDLDIRECT 101.7 03/26/2012   ALT 13 02/06/2013   AST 17 02/06/2013   NA 139 02/06/2013   K  4.4 02/06/2013   CL 103 02/06/2013   CREATININE 0.8 02/06/2013   BUN 15 02/06/2013   CO2 31 02/06/2013   TSH 0.94 02/06/2013   HGBA1C 7.4* 02/06/2013   MICROALBUR 3.6* 09/24/2012   Procedure note: (L 5th flex tendon) Procedure: trigger finger (tendon sheath) injection Dx: stenosing tenosynovitis After obtaining verbal informed consent, pt elects to proceed. Using sterile technique throughout, anesthetized with ethyl chloride, injected with 0.5cc DepoMedrol (40mg ) and 1cc 1% lidocaine without epi into area of maximal tenderness around the A1 pulley. Pt tolerated procedure well, instructed to ice at injection site 24-48h - post injection instructions given      Assessment & Plan:   Stenosing tendinitis,  left fifth finger flexor -injection today as above. After instructions provided. Patient tolerated well  Varicose veins, BLE -mild aching symptoms. Written prescription for compression this provided. Ibuprofen 600 mg 3 times a day when necessary also provided. Patient to call if symptoms worse or unimproved See problem list. Medications and labs reviewed today.

## 2013-02-28 NOTE — Telephone Encounter (Signed)
Left msg on vm stating medical supply place need to know what compression she need for her stockings 15/20, 20/30, or 30/40...Raechel Chute

## 2013-02-28 NOTE — Telephone Encounter (Signed)
Notified pt with md response.../lmb 

## 2013-02-28 NOTE — Patient Instructions (Addendum)
It was good to see you today. We have reviewed your prior records including labs and tests today Medications reviewed and updated, no changes recommended At this time We have injected her left fifth trigger finger today - ice an injection site 3 times a day for the next 2 days, then as needed For your varicose veins and leg pain, wear knee-high compression from AM until bedtime each day  Also use ibuprofen 600mg  1 tablet at bedtime for one week and as needed for discomfort Your prescription(s) have been submitted to your pharmacy. Please take as directed and contact our office if you believe you are having problem(s) with the medication(s). Please schedule followup in 6 months, call sooner if problems.   Trigger Finger Trigger finger (digital tendinitis and stenosing tenosynovitis) is a common disorder that causes an often painful catching of the fingers or thumb. It occurs as a clicking, snapping or locking of a finger in the palm of the hand. The reason for this is that there is a problem with the tendons which flex the fingers sliding smoothly through their sheaths. The cause of this may be inflammation of the tendon and sheath, or from a thickening or nodule in the tendon. The condition may occur in any finger or a couple fingers at the same time. The cause may be overuse while doing the same activity over and over again with your hands.  Tendons are the tough cords that connect the muscles to bones. Muscles and tendons are part of the system which allows your body to move. When muscles contract in the forearm on the palm side, they pull the tendons toward the elbow and cause the fingers and thumb to bend (flex) toward the palm. These are the flexor tendons. The tendons slide through a slippery smooth membrane (synovium) which is called the tendon sheath. The sheaths have areas of tough fibrous tissues surrounding them which hold the tendons close to the bone. These are called pulleys because they  work like a pulley. The first pulley is in the palm of the hand near the crease which runs across your palm. If the area of the tendon thickening is near the pulley, the tendon cannot slide smoothly through the pulley and this causes the trigger finger. The finger may lock with the finger curled or suddenly straighten out with a snap. This is more common in patients with rheumatoid arthritis and diabetes. Left untreated, the condition may get worse to the point where the finger becomes locked in flexion, like making a fist, or less commonly locked with the finger straightened out. DIAGNOSIS  Your caregiver will easily make this diagnosis on examination. TREATMENT   Splinting for 6 to 8 weeks of time may be helpful. Use the splints as your caregiver suggests.  Heat used for twenty minutes at least four times a day followed by ice packs for twenty minutes unless directed otherwise by your caregiver may be helpful. If you find either heat or cold seems to be making the problem worse, quit using them and ask your caregiver for directions.  Cortisone injections along with splinting may speed up recovery. Several injections may be required. Cortisone may give relief after one injection.  Only take over-the-counter or prescription medicines for pain, discomfort, or fever as directed by your caregiver.  Surgery is another treatment that may be used if conservative treatments using injection and splinting does not work. Surgery can be minor without incisions (a cut does not have to be made) and  can be done with a needle through the skin. No stitches are needed and most patients may return to work the same day.  Other surgical choices involve an open procedure where the surgeon opens the hand through a small incision (cut) and cuts the pulley so the tendon can again slide smoothly. Your hand will still work fine. This small operation requires stitches and the recovery will be a little longer and the incisions  will need to be protected until completely healed. You may have to limit your activities for up to 6 months.  Occupational or hand therapy may be required if there is stiffness remaining in the finger. RISKS AND COMPLICATIONS Complications are uncommon but some problems that may occur are:  Recurrence of the trigger finger. This does not mean that the surgery was not well done. It simply means that you may have formed scar tissue following surgery that causes the problem to reoccur.  Infection which could ruin the results of the surgery and can result in a finger which is frozen and can not move normally.  Nerve injury is possible which could result in permanent numbness of one or more fingers. CARE AFTER SURGERY  Elevate your hand above your heart and use ice as instructed.  Follow instructions regarding finger motion/exercise.  Keep the surgical wound dry for at least 48 hrs or longer if instructed.  Keep your follow-up appointments.  Return to work and normal activities as instructed. SEEK IMMEDIATE MEDICAL CARE IF:  Your problems are getting worse or you do not obtain relief from the treatment. Document Released: 04/08/2004 Document Revised: 09/11/2011 Document Reviewed: 12/01/2008 [facility] Patient Information 2014 [place], [place].

## 2013-03-05 ENCOUNTER — Other Ambulatory Visit: Payer: Self-pay | Admitting: *Deleted

## 2013-03-05 MED ORDER — LOSARTAN POTASSIUM 25 MG PO TABS: ORAL_TABLET | ORAL | Status: DC

## 2013-03-11 ENCOUNTER — Telehealth: Payer: Self-pay | Admitting: Internal Medicine

## 2013-03-11 NOTE — Telephone Encounter (Signed)
Patient Information:  Caller Name: Gwynne  Phone: [phone]  Patient: Leslie Lane, Leslie Lane  Gender: Female  DOB: 04/07/1956  Age: 57 Years  PCP: Rene Paci (Adults only)  Office Follow Up:  Does the office need to follow up with this patient?: No  Instructions For The Office: N/A   Symptoms  Reason For Call & Symptoms: C/O severe abdominal pain.  States it feels like it is jerking.  Onset: "Months ago".  Patient states that she is unable to get up due pain.  Reviewed Health History In EMR: Yes  Reviewed Medications In EMR: Yes  Reviewed Allergies In EMR: Yes  Reviewed Surgeries / Procedures: Yes  Date of Onset of Symptoms: 03/04/2013  Guideline(s) Used:  Abdominal Pain - Female  Disposition Per Guideline:   Go to ED Now  Reason For Disposition Reached:   Severe abdominal pain (e.g., excruciating)  Advice Given:  Call Back If:  Abdominal pain is constant and present for more than 2 hours  Abdominal pains come and go and are present for more than 24 hours  You are pregnant  You become worse.  Patient Will Follow Care Advice:  YES

## 2013-03-28 ENCOUNTER — Ambulatory Visit: Payer: Medicare HMO | Admitting: Internal Medicine

## 2013-04-04 ENCOUNTER — Other Ambulatory Visit: Payer: Self-pay | Admitting: Internal Medicine

## 2013-04-07 NOTE — Telephone Encounter (Signed)
Faxed script back to [facility].../lmb 

## 2013-04-11 ENCOUNTER — Ambulatory Visit (INDEPENDENT_AMBULATORY_CARE_PROVIDER_SITE_OTHER): Payer: Medicare HMO | Admitting: Endocrinology

## 2013-04-11 ENCOUNTER — Encounter: Payer: Self-pay | Admitting: Endocrinology

## 2013-04-11 VITALS — BP 136/68 | HR 84 | Temp 98.2°F | Resp 12 | Ht 64.0 in | Wt 139.0 lb

## 2013-04-11 DIAGNOSIS — E1065 Type 1 diabetes mellitus with hyperglycemia: Secondary | ICD-10-CM

## 2013-04-11 DIAGNOSIS — Z5189 Encounter for other specified aftercare: Secondary | ICD-10-CM

## 2013-04-11 NOTE — Progress Notes (Signed)
Patient ID: Leslie Lane, female   DOB: 11-13-1955, 57 y.o.   MRN: [id number]  Leslie Lane is an 57 y.o. female.   Reason for Appointment: Diabetes follow-up   History of Present Illness   Diagnosis: Type 1 DIABETES MELITUS, diagnosed 1967     Insulin regimen: Lantus 10 units a.m., 8 units h.s. Humalog 1-3 p.r.n.  She has had labile blood sugar control over the last several years even though A1c has been usually around 7% She has had less lability and hypoglycemia with taking b.i.d. Lantus compared once a day She has been very sensitive to fast acting insulin and frequently does not require mealtime coverage  RECENT history: On this visit again she appears to have very frequent hypoglycemia including a recent severe episode requiring assistance by her family member. She is  taking Humalog insulin as needed when her blood sugars are high but also she is covering her evening snacks with Humalog even though she is not eating a large meal and most snacks like popcorn Again her blood sugars are the lowest at bedtime and she is not recognizing the trend. Also probably is getting relatively low readings before supper recently even though she is not very active Proper timing of insulin in relation to meals: Yes.          Some high morning readings may be related to rebound from hypoglycemia  DIET: She is usually eating balanced meals but sometimes eating more snacks like popcorn after supper. Usually trying to avoid high-fat meals. May eat more when she is anxious.  Monitors blood glucose: Once a day.    Glucometer: One Touch.          Blood Glucose readings from meter download: readings before breakfast:fasting recently 120-202 but as low as 27 with median about 157 LUNCHTIME 44-280, median 166 Afternoon 79-342 with only a few readings. Suppertime and suppertime median median 107, range 48-267 H.s. Median 51, range 25-128 Overall median 129   HYPOGLYCEMIA: she has 27% of readings below  70 and most often at bedtime, recently also before supper     COMPLICATIONS: No history of neuropathy, nephropathy retinopathy      Lab Results  Component Value Date   MICROALBUR 3.6* 09/24/2012   No visits with results within 1 Week(s) from this visit. Latest known visit with results is:  Office Visit on 02/06/2013  Component Date Value Range Status  . Cholesterol 02/06/2013 180  0 - 200 mg/dL Final   ATP III Classification       Desirable:  < 200 mg/dL               Borderline High:  200 - 239 mg/dL          High:  > = 409 mg/dL  . Triglycerides 02/06/2013 76.0  0.0 - 149.0 mg/dL Final   Normal:  <811 mg/dLBorderline High:  150 - 199 mg/dL  . HDL 02/06/2013 70.40  >39.00 mg/dL Final  . VLDL 91/47/8295 15.2  0.0 - 40.0 mg/dL Final  . LDL Cholesterol 02/06/2013 94  0 - 99 mg/dL Final  . Total CHOL/HDL Ratio 02/06/2013 3   Final                  Men          Women1/2 Average Risk     3.4          3.3Average Risk          5.0  4.42X Average Risk          9.6          7.13X Average Risk          15.0          11.0                      . Hemoglobin A1C 02/06/2013 7.4* 4.6 - 6.5 % Final   Glycemic Control Guidelines for People with Diabetes:Non Diabetic:  <6%Goal of Therapy: <7%Additional Action Suggested:  >8%   . Sodium 02/06/2013 139  135 - 145 mEq/L Final  . Potassium 02/06/2013 4.4  3.5 - 5.1 mEq/L Final  . Chloride 02/06/2013 103  96 - 112 mEq/L Final  . CO2 02/06/2013 31  19 - 32 mEq/L Final  . Glucose, Bld 02/06/2013 82  70 - 99 mg/dL Final  . BUN 21/30/8657 15  6 - 23 mg/dL Final  . Creatinine, Ser 02/06/2013 0.8  0.4 - 1.2 mg/dL Final  . Total Bilirubin 02/06/2013 0.2* 0.3 - 1.2 mg/dL Final  . Alkaline Phosphatase 02/06/2013 51  39 - 117 U/L Final  . AST 02/06/2013 17  0 - 37 U/L Final  . ALT 02/06/2013 13  0 - 35 U/L Final  . Total Protein 02/06/2013 6.7  6.0 - 8.3 g/dL Final  . Albumin 84/69/6295 3.8  3.5 - 5.2 g/dL Final  . Calcium 28/41/3244 9.5  8.4 - 10.5 mg/dL  Final  . GFR 07/05/7251 76.97  >60.00 mL/min Final  . Free T4 02/06/2013 0.84  0.60 - 1.60 ng/dL Final  . TSH 66/44/0347 0.94  0.35 - 5.50 uIU/mL Final     Wt Readings from Last 3 Encounters:  04/11/13 139 lb (63.05 kg)  02/28/13 141 lb 3.2 oz (64.048 kg)  02/06/13 139 lb (63.05 kg)       Medication List       This list is accurate as of: 04/11/13 11:59 PM.  Always use your most recent med list.               esomeprazole 40 MG capsule  Commonly known as:  NEXIUM  Take 40 mg by mouth daily before breakfast.     glucagon 1 MG injection  Commonly known as:  GLUCAGON EMERGENCY  TAKE AS DIRECTED     HUMALOG 100 UNIT/ML injection  Generic drug:  insulin lispro  Inject into the skin 3 (three) times daily before meals. Sliding scale 10 units in am and 8 units at night     ibuprofen 600 MG tablet  Commonly known as:  ADVIL,MOTRIN  Take 1 tablet (600 mg total) by mouth every 6 (six) hours as needed for pain.     LANTUS 100 UNIT/ML injection  Generic drug:  insulin glargine  Inject 6-10 Units into the skin 2 (two) times daily. Take 10 units in the am, and 6-8 units at supper     losartan 25 MG tablet  Commonly known as:  COZAAR  TAKE 1 TABLET BY MOUTH DAILY     multivitamin tablet  Take 1 tablet by mouth daily.     ONE TOUCH ULTRA TEST test strip  Generic drug:  glucose blood     simvastatin 20 MG tablet  Commonly known as:  ZOCOR  Take 20 mg by mouth at bedtime.     traMADol 50 MG tablet  Commonly known as:  ULTRAM  TAKE 2 TABLETS BY MOUTH EVERY 8 HOURS AS NEEDED  traZODone 100 MG tablet  Commonly known as:  DESYREL  Take 1-2 tablets (100-200 mg total) by mouth at bedtime.     venlafaxine 37.5 MG tablet  Commonly known as:  EFFEXOR  Take 37.5 mg by mouth daily after breakfast.     vitamin A 91478 UNIT capsule  Take 10,000 Units by mouth daily.     vitamin B-12 1000 MCG tablet  Commonly known as:  CYANOCOBALAMIN  Take 1,000 mcg by mouth daily.      vitamin E 1000 UNIT capsule  Generic drug:  vitamin E  Take 1,000 Units by mouth daily.        Allergies:  Allergies  Allergen Reactions  . Penicillins   . Sulfonamide Derivatives     Past Medical History  Diagnosis Date  . HEPATITIS B   . BIPOLAR AFFECTIVE DISORDER   . SMOKER   . MIGRAINE HEADACHE   . NECK PAIN, CHRONIC   . LOW BACK PAIN, CHRONIC   . ALLERGIC RHINITIS   . HYPERLIPIDEMIA   . GERD   . DIABETES MELLITUS, TYPE II     follows with endo  . DEPRESSION   . NARCOTIC ABUSE     hx of    Past Surgical History  Procedure Laterality Date  . Cesarean section      Family History  Problem Relation Age of Onset  . Arthritis Mother   . Arthritis Father   . Kidney disease Father   . Diabetes Other     parent  . Hyperlipidemia Other     parent  . Kidney disease Other     aunt    Social History:  reports that she has been smoking Cigarettes.  She has been smoking about 0.00 packs per day for the past 40 years. She does not have any smokeless tobacco history on file. She reports that she drinks alcohol. She reports that she does not use illicit drugs.  Review of Systems:  DEPRESSION: She has had long-standing depression and anxiety followed by psychiatrist   HYPERLIPIDEMIA: The lipid abnormality consists of elevated LDL treated  with simvastatin She has been treated with low-dose Cozaar by PCP, no history of hypertension or microalbuminuria Has history of small goiter but no hypothyroidism      Examination:   BP 136/68  Pulse 84  Temp(Src) 98.2 F (36.8 C)  Resp 12  Ht 5\' 4"  (1.626 m)  Wt 139 lb (63.05 kg)  BMI 23.85 kg/m2  SpO2 97%  Body mass index is 23.85 kg/(m^2).    ASSESSMENT/ PLAN::   Diabetes type 1  The patient's diabetes control appears to be again difficult with having  frequent hypoglycemia again although not as frequently. Most of her low sugars are before and after supper recently Despite her tendency to low sugars late at night  she is taking some Humalog for her evening snacks even though her sugars may not be consistently high before supper. Discussed that she does not need the Humalog before the amount of carbohydrate that she is t conceiving in the evening Also because of her sugars being relatively low before supper recently will reduce her Lantus by at least one unit Again explained to her the adjustment of the morning or the evening Lantus doses based on suppertime or fasting readings  She will start using less Humalog and avoid using it for her evening snacks Discussed with her that her blood sugar targets are more liberal because of her hypoglycemia unawareness and tendency to  an expected low sugars Also discussed treatment of hypoglycemia including advising her mother to use glucagon whenever she is unable to respond well and swallow liquids She will only take correction doses and blood sugars are over 200 before meals  Counseling time over 50% of today's 25 minute visit  Leslie Lane 04/14/2013, 5:21 PM

## 2013-04-11 NOTE — Patient Instructions (Signed)
Reduce Lantus to 9 in am   No Humalog at supper unless sugar >250  No Humalog for popcorn

## 2013-04-25 ENCOUNTER — Other Ambulatory Visit: Payer: Self-pay | Admitting: *Deleted

## 2013-04-25 MED ORDER — INSULIN GLARGINE 100 UNIT/ML ~~LOC~~ SOLN: SUBCUTANEOUS | Status: DC

## 2013-05-07 ENCOUNTER — Encounter: Payer: Medicare HMO | Attending: Endocrinology | Admitting: Nutrition

## 2013-05-07 ENCOUNTER — Encounter: Payer: Self-pay | Admitting: Nutrition

## 2013-05-07 DIAGNOSIS — IMO0002 Reserved for concepts with insufficient information to code with codable children: Secondary | ICD-10-CM | POA: Insufficient documentation

## 2013-05-07 DIAGNOSIS — Z713 Dietary counseling and surveillance: Secondary | ICD-10-CM | POA: Insufficient documentation

## 2013-05-07 DIAGNOSIS — E1065 Type 1 diabetes mellitus with hyperglycemia: Secondary | ICD-10-CM

## 2013-05-07 NOTE — Progress Notes (Signed)
Pt. Reports that she was at a friend's house for the night, and her friends found her at 6AM unresponsive and sweaty.  EMS was called and they fed her 3 glasses of OJ, and gave her a peanut butter sandwich.  Blood sugar that next day was in the high 300s.  At supper, her blood sugar was 274, so she took 2u Humalog.  At [facility] it was 53 (5 hr. Later).  She ate 3 peanut butter and graham crackers with a small banana.  FBS today was 53.  ACL: 64.  She takes no Humalog unless blood sugar is over 300.   Current dose of Lantus:  AM: 10u,  PcS: 8u.

## 2013-05-07 NOTE — Patient Instructions (Signed)
Plan:  Per Dr. Remus Blake order:  AM: 10u,  PcS: 6u.  Call blood sugars on Monday.   Written instructions were given to her with my phone number.

## 2013-05-28 ENCOUNTER — Other Ambulatory Visit: Payer: Self-pay | Admitting: Internal Medicine

## 2013-05-28 ENCOUNTER — Telehealth: Payer: Self-pay

## 2013-05-28 NOTE — Telephone Encounter (Signed)
Script for Tramadol was faxed to pharmacy on 05/28/2013 at 1:40 pm. /met  To # [phone]

## 2013-06-02 ENCOUNTER — Other Ambulatory Visit: Payer: Self-pay | Admitting: *Deleted

## 2013-06-02 MED ORDER — ESOMEPRAZOLE MAGNESIUM 40 MG PO CPDR: 40.0000 mg | DELAYED_RELEASE_CAPSULE | Freq: Every day | ORAL | Status: DC

## 2013-06-04 ENCOUNTER — Ambulatory Visit: Payer: Medicare HMO | Admitting: Internal Medicine

## 2013-06-04 DIAGNOSIS — Z0289 Encounter for other administrative examinations: Secondary | ICD-10-CM

## 2013-06-10 ENCOUNTER — Other Ambulatory Visit (INDEPENDENT_AMBULATORY_CARE_PROVIDER_SITE_OTHER): Payer: Medicare HMO

## 2013-06-10 DIAGNOSIS — IMO0002 Reserved for concepts with insufficient information to code with codable children: Secondary | ICD-10-CM

## 2013-06-10 DIAGNOSIS — E1065 Type 1 diabetes mellitus with hyperglycemia: Secondary | ICD-10-CM

## 2013-06-10 LAB — URINALYSIS, ROUTINE W REFLEX MICROSCOPIC
Bilirubin Urine: NEGATIVE
Ketones, ur: NEGATIVE
Specific Gravity, Urine: 1.01 (ref 1.000–1.030)
Total Protein, Urine: NEGATIVE
Urine Glucose: >=1000
pH: 7 (ref 5.0–8.0)

## 2013-06-10 LAB — LIPID PANEL
HDL: 56.6 mg/dL (ref >39.00)
Total CHOL/HDL Ratio: 3

## 2013-06-10 LAB — MICROALBUMIN / CREATININE URINE RATIO
Creatinine,U: 47.1 mg/dL
Microalb Creat Ratio: 0.2 mg/g (ref 0.0–30.0)
Microalb, Ur: 0.1 mg/dL (ref 0.0–1.9)

## 2013-06-10 LAB — COMPREHENSIVE METABOLIC PANEL
ALT: 15 U/L (ref 0–35)
Alkaline Phosphatase: 56 U/L (ref 39–117)
CO2: 31 mEq/L (ref 19–32)
GFR: 76.88 mL/min (ref >60.00)
Sodium: 129 mEq/L — ABNORMAL LOW (ref 135–145)
Total Bilirubin: 0.2 mg/dL — ABNORMAL LOW (ref 0.3–1.2)
Total Protein: 6.6 g/dL (ref 6.0–8.3)

## 2013-06-13 ENCOUNTER — Other Ambulatory Visit: Payer: Self-pay | Admitting: Endocrinology

## 2013-06-13 ENCOUNTER — Ambulatory Visit (INDEPENDENT_AMBULATORY_CARE_PROVIDER_SITE_OTHER): Payer: Medicare HMO | Admitting: Endocrinology

## 2013-06-13 ENCOUNTER — Encounter: Payer: Self-pay | Admitting: Endocrinology

## 2013-06-13 VITALS — BP 102/56 | HR 95 | Temp 98.3°F | Resp 12 | Ht 64.0 in | Wt 135.5 lb

## 2013-06-13 DIAGNOSIS — E785 Hyperlipidemia, unspecified: Secondary | ICD-10-CM

## 2013-06-13 DIAGNOSIS — R109 Unspecified abdominal pain: Secondary | ICD-10-CM

## 2013-06-13 DIAGNOSIS — E1065 Type 1 diabetes mellitus with hyperglycemia: Secondary | ICD-10-CM

## 2013-06-13 DIAGNOSIS — Z5189 Encounter for other specified aftercare: Secondary | ICD-10-CM

## 2013-06-13 NOTE — Progress Notes (Signed)
Patient ID: Leslie Lane, female   DOB: [birth year], 57 y.o.   MRN: [id number]   Reason for Appointment: Diabetes follow-up   History of Present Illness   Diagnosis: Type 1 DIABETES MELITUS, diagnosed 1967     Insulin regimen: Lantus 10 units a.m., 7 units h.s. Humalog 1-3 p.r.n.  She has had labile blood sugar control over the last several years even though A1c has been usually around 7% She has had less lability and hypoglycemia with taking b.i.d. Lantus compared once a day She has been very sensitive to fast acting insulin and frequently does not require mealtime coverage  RECENT history: Her blood sugars are again quite variable, ranging from 20-430 On her last visit she was advised to avoid taking more than 2 units of Humalog but is still frequently taking 3 units for high readings A1c has increased possibly from less frequent hypoglycemia no high readings and evenings and overnight  Proper timing of insulin in relation to meals:  no     HYPERGLYCEMIA occurs mostly around 6 PM and this is related to frequent snacks in the afternoons such as ice cream She also has sporadic high readings late at night and in the morning from dietary indiscretions FASTING blood sugars are quite variable including a reading of 38 this morning This is despite reducing her evening Lantus to 7 units  HYPOGLYCEMIA: This is mostly on waking up and sporadically early afternoon and bedtime. She has 18%of readings below 70 compared to 27% previously Hypoglycemia at night is probably related to taking coverage for postprandial hyperglycemia at suppertime Rarely will have persistent hypoglycemia both early morning and midd      DIET: She is usually eating balanced meals and is eating high-fat snacks like ice cream, chips and popcorn at various times. Usually trying to avoid high-fat meals. May eat more when she is anxious.  Monitors blood glucose:  4 or more times a day.    Glucometer: One Touch.       Currently her monitor has an incorrect date programmed      Blood Glucose readings from meter download:   PREMEAL Breakfast Lunch Dinner Bedtime Overall  Glucose range:  38-280   20-284   142-360   44-180    Mean/median:  130   145   250   108   159    POST-MEAL PC Breakfast PC Lunch PC Dinner  Glucose range: ?   41-263    Mean/median:      Labs:  Lab Results  Component Value Date   HGBA1C 8.2* 06/10/2013   HGBA1C 7.4* 02/06/2013   HGBA1C 7.6* 09/24/2012   Lab Results  Component Value Date   MICROALBUR 0.1 06/10/2013   LDLCALC 97 06/10/2013   CREATININE 0.8 06/10/2013    Appointment on 06/10/2013  Component Date Value Range Status  . Hemoglobin A1C 06/10/2013 8.2* 4.6 - 6.5 % Final   Glycemic Control Guidelines for People with Diabetes:Non Diabetic:  <6%Goal of Therapy: <7%Additional Action Suggested:  >8%   . Cholesterol 06/10/2013 172  0 - 200 mg/dL Final   ATP III Classification       Desirable:  < 200 mg/dL               Borderline High:  200 - 239 mg/dL          High:  > = 409 mg/dL  . Triglycerides 06/10/2013 92.0  0.0 - 149.0 mg/dL Final   Normal:  <811 mg/dLBorderline High:  150 - 199 mg/dL  . HDL 06/10/2013 56.60  >39.00 mg/dL Final  . VLDL 65/78/4696 18.4  0.0 - 40.0 mg/dL Final  . LDL Cholesterol 06/10/2013 97  0 - 99 mg/dL Final  . Total CHOL/HDL Ratio 06/10/2013 3   Final                  Men          Women1/2 Average Risk     3.4          3.3Average Risk          5.0          4.42X Average Risk          9.6          7.13X Average Risk          15.0          11.0                      . Sodium 06/10/2013 129* 135 - 145 mEq/L Final  . Potassium 06/10/2013 4.4  3.5 - 5.1 mEq/L Final  . Chloride 06/10/2013 94* 96 - 112 mEq/L Final  . CO2 06/10/2013 31  19 - 32 mEq/L Final  . Glucose, Bld 06/10/2013 309* 70 - 99 mg/dL Final  . BUN 29/52/8413 12  6 - 23 mg/dL Final  . Creatinine, Ser 06/10/2013 0.8  0.4 - 1.2 mg/dL Final  . Total Bilirubin 06/10/2013 0.2* 0.3 - 1.2  mg/dL Final  . Alkaline Phosphatase 06/10/2013 56  39 - 117 U/L Final  . AST 06/10/2013 17  0 - 37 U/L Final  . ALT 06/10/2013 15  0 - 35 U/L Final  . Total Protein 06/10/2013 6.6  6.0 - 8.3 g/dL Final  . Albumin 24/40/1027 3.6  3.5 - 5.2 g/dL Final  . Calcium 25/36/6440 9.0  8.4 - 10.5 mg/dL Final  . GFR 34/74/2595 76.88  >60.00 mL/min Final  . Color, Urine 06/10/2013 LT. YELLOW  Yellow;Lt. Yellow Final  . APPearance 06/10/2013 CLEAR  Clear Final  . Specific Gravity, Urine 06/10/2013 1.010  1.000-1.030 Final  . pH 06/10/2013 7.0  5.0 - 8.0 Final  . Total Protein, Urine 06/10/2013 NEGATIVE  Negative Final  . Urine Glucose 06/10/2013 >=1000  Negative Final  . Ketones, ur 06/10/2013 NEGATIVE  Negative Final  . Bilirubin Urine 06/10/2013 NEGATIVE  Negative Final  . Hgb urine dipstick 06/10/2013 NEGATIVE  Negative Final  . Urobilinogen, UA 06/10/2013 0.2  0.0 - 1.0 Final  . Leukocytes, UA 06/10/2013 NEGATIVE  Negative Final  . Nitrite 06/10/2013 NEGATIVE  Negative Final  . WBC, UA 06/10/2013 0-2/hpf  0-2/hpf Final  . RBC / HPF 06/10/2013 none seen  0-2/hpf Final  . Squamous Epithelial / LPF 06/10/2013 Rare(0-4/hpf)  Rare(0-4/hpf) Final  . Microalb, Ur 06/10/2013 0.1  0.0 - 1.9 mg/dL Final  . Creatinine,U 63/87/5643 47.1   Final  . Microalb Creat Ratio 06/10/2013 0.2  0.0 - 30.0 mg/g Final     Wt Readings from Last 3 Encounters:  06/13/13 135 lb 8 oz (61.462 kg)  04/11/13 139 lb (63.05 kg)  02/28/13 141 lb 3.2 oz (64.048 kg)       Medication List       This list is accurate as of: 06/13/13 10:47 AM.  Always use your most recent med list.               esomeprazole 40 MG capsule  Commonly known as:  NEXIUM  Take 1 capsule (40 mg total) by mouth daily before breakfast.     glucagon 1 MG injection  Commonly known as:  GLUCAGON EMERGENCY  TAKE AS DIRECTED     HUMALOG 100 UNIT/ML injection  Generic drug:  insulin lispro  Inject into the skin 3 (three) times daily before  meals. Sliding scale 10 units in am and 8 units at night     ibuprofen 600 MG tablet  Commonly known as:  ADVIL,MOTRIN  Take 1 tablet (600 mg total) by mouth every 6 (six) hours as needed for pain.     insulin glargine 100 UNIT/ML injection  Commonly known as:  LANTUS  Inject 9 units into the skin in the morning as instructed.     losartan 25 MG tablet  Commonly known as:  COZAAR  TAKE 1 TABLET BY MOUTH DAILY     multivitamin tablet  Take 1 tablet by mouth daily.     ONE TOUCH ULTRA TEST test strip  Generic drug:  glucose blood     simvastatin 20 MG tablet  Commonly known as:  ZOCOR  Take 20 mg by mouth at bedtime.     traMADol 50 MG tablet  Commonly known as:  ULTRAM  TAKE 2 TABLETS BY MOUTH EVERY 8 HOURS AS NEEDED     traZODone 100 MG tablet  Commonly known as:  DESYREL  Take 1-2 tablets (100-200 mg total) by mouth at bedtime.     venlafaxine 37.5 MG tablet  Commonly known as:  EFFEXOR  Take 37.5 mg by mouth daily after breakfast.     vitamin A 16109 UNIT capsule  Take 10,000 Units by mouth daily.     vitamin B-12 1000 MCG tablet  Commonly known as:  CYANOCOBALAMIN  Take 1,000 mcg by mouth daily.     vitamin E 1000 UNIT capsule  Generic drug:  vitamin E  Take 1,000 Units by mouth daily.        Allergies:  Allergies  Allergen Reactions  . Penicillins   . Sulfonamide Derivatives     Past Medical History  Diagnosis Date  . HEPATITIS B   . BIPOLAR AFFECTIVE DISORDER   . SMOKER   . MIGRAINE HEADACHE   . NECK PAIN, CHRONIC   . LOW BACK PAIN, CHRONIC   . ALLERGIC RHINITIS   . HYPERLIPIDEMIA   . GERD   . DIABETES MELLITUS, TYPE II     follows with endo  . DEPRESSION   . NARCOTIC ABUSE     hx of    Past Surgical History  Procedure Laterality Date  . Cesarean section      Family History  Problem Relation Age of Onset  . Arthritis Mother   . Arthritis Father   . Kidney disease Father   . Diabetes Other     parent  . Hyperlipidemia Other      parent  . Kidney disease Other     aunt    Social History:  reports that she has been smoking Cigarettes.  She has been smoking about 0.00 packs per day for the past 40 years. She does not have any smokeless tobacco history on file. She reports that she drinks alcohol. She reports that she does not use illicit drugs.  Review of Systems:  She is complaining about upper abdominal pain. She has not been able to see her PCP recently. Does not want to wait until her next scheduled appointment  DEPRESSION: She  has had long-standing depression and anxiety followed by psychiatrist   HYPERLIPIDEMIA: The lipid abnormality consists of elevated LDL treated  with simvastatin  She has been treated with low-dose Cozaar by PCP, no history of hypertension or microalbuminuria Has history of small goiter but no hypothyroidism      Examination:   BP 102/56  Pulse 95  Temp(Src) 98.3 F (36.8 C)  Resp 12  Ht 5\' 4"  (1.626 m)  Wt 135 lb 8 oz (61.462 kg)  BMI 23.25 kg/m2  SpO2 95%  Body mass index is 23.25 kg/(m^2).    ASSESSMENT/ PLAN::   Diabetes type 1  The patient's diabetes control appears to be  difficult with having periodic hypoglycemia again although not as frequently. See history of present illness for details on current blood sugar patterns and home management  Problems identified:  Tendency to low blood sugars on waking up including today, only occasionally related to over correcting at bedtime  Sporadic low readings at different times again probably related to overcorrection of high readings  Consistently high readings around supper time from afternoon snacks  Relatively low readings at bedtime likely to be from taking extra Humalog at supper. She tends to over treat her high readings even though she is very sensitive to 1-2 units units of insulin Recommendations were made: Insulin regimen: Lantus 11 units a.m., 6 units h.s. which is one unit more in the morning and 1 unit  less in the evening  Take Humalog 1 unit for ice cream while eating Take only 2 units for high sugars units 300  Discussed with her that her blood sugar targets are more liberal because of her hypoglycemia unawareness and tendency to an expected low sugars  Abdominal pain: GI consult made  Counseling time over 50% of today's 25 minute visit  Leslie Lane 06/13/2013, 10:47 AM

## 2013-06-13 NOTE — Patient Instructions (Signed)
Insulin regimen: Lantus 11 units a.m., 6 units h.s.   Take Humalog 1 unit for ice cream  Take only 2 units for high sugars

## 2013-06-23 ENCOUNTER — Telehealth: Payer: Self-pay | Admitting: *Deleted

## 2013-06-23 NOTE — Telephone Encounter (Signed)
Pt says last time you saw her you were going to make a referral to a G.I Dr.   She said she hasn't heard anything yet and her stomach is still causing her a lot of pain.

## 2013-06-23 NOTE — Telephone Encounter (Signed)
I have made a referral, please call the referral coordinator

## 2013-06-24 ENCOUNTER — Encounter: Payer: Self-pay | Admitting: Internal Medicine

## 2013-06-24 ENCOUNTER — Encounter: Payer: Self-pay | Admitting: Gastroenterology

## 2013-07-02 ENCOUNTER — Telehealth: Payer: Self-pay

## 2013-07-02 NOTE — Telephone Encounter (Signed)
Patient states she called the insurance company and they told her that she took to long to pick a PCP so they just put one on the card. [employer] also told her that they will put her PCP on her card and mail hew new cards within a week. Told patient to call me back as soon as she gets her new cards in the mail so we can get Dr. Diamantina Monks office to initiate the referral. Pt agreed and will call me back. Also left a message for Gavin Pound at Dr. Diamantina Monks to notify her of this information.

## 2013-07-02 NOTE — Telephone Encounter (Signed)
Left a message with Gavin Pound at Dr. Raphael Gibney office. Pt needs referral from Primary Care Physician instead of Endocrinology due to new insurance.

## 2013-07-02 NOTE — Telephone Encounter (Signed)
Deborah from Dr. Raphael Gibney office states that she cannot do the referral because Denny Levy, MD is on the card as her patient's PCP. Told Gavin Pound I will contact patient and have her contact Dr. Donnetta Hail office about the referral.    Informed patient about the changes on her [facility] Choice plan that they require a referral from her PCP to have any office visits or procedures. Told her that the card has Denny Levy, MD has her PCP and not Rene Paci, MD. Pt states she has never seen Denny Levy, MD and does not know who she is. Told patient that unless we get the referral from Denny Levy, MD her PCP that she will be in charge of out of pocket expenses. Patient states she will call her insurance company to get it changed and find out why it does state that and will call me back. Told her that I will leave her appt scheduled for now but for her to call me back as soon as she can. Pt agreed.

## 2013-07-13 ENCOUNTER — Other Ambulatory Visit: Payer: Self-pay | Admitting: Internal Medicine

## 2013-07-14 ENCOUNTER — Other Ambulatory Visit: Payer: Self-pay | Admitting: Endocrinology

## 2013-07-14 NOTE — Telephone Encounter (Signed)
Ok to refill? Last OV 8.29.14 Last filled 11.26.14

## 2013-07-19 ENCOUNTER — Other Ambulatory Visit: Payer: Self-pay | Admitting: Endocrinology

## 2013-07-23 ENCOUNTER — Ambulatory Visit (INDEPENDENT_AMBULATORY_CARE_PROVIDER_SITE_OTHER): Payer: Medicare HMO | Admitting: Gastroenterology

## 2013-07-23 ENCOUNTER — Encounter: Payer: Self-pay | Admitting: Gastroenterology

## 2013-07-23 ENCOUNTER — Other Ambulatory Visit (INDEPENDENT_AMBULATORY_CARE_PROVIDER_SITE_OTHER): Payer: Medicare HMO

## 2013-07-23 ENCOUNTER — Other Ambulatory Visit: Payer: Medicare HMO

## 2013-07-23 VITALS — BP 80/46 | HR 96 | Ht 64.25 in | Wt 136.1 lb

## 2013-07-23 DIAGNOSIS — R109 Unspecified abdominal pain: Secondary | ICD-10-CM

## 2013-07-23 LAB — BUN: BUN: 11 mg/dL (ref 6–23)

## 2013-07-23 LAB — CREATININE, SERUM: CREATININE: 1.1 mg/dL (ref 0.4–1.2)

## 2013-07-23 MED ORDER — GLYCOPYRROLATE 1 MG PO TABS
1.0000 mg | ORAL_TABLET | Freq: Two times a day (BID) | ORAL | Status: DC
Start: 2013-07-23 — End: 2015-03-02

## 2013-07-23 NOTE — Progress Notes (Signed)
    History of Present Illness: This is a 58 year old female accompanied by her mother. Patient relates a "nervous" and "jumpy" lower abdomen. She occasionally has diarrhea. She develops constipation from tramadol. The symptoms have been present for many years. Her lower abdominal symptoms are not necessarily related to bowel movements or meals. She has fibromyalgia. She previously underwent colonoscopy by Dr. Penelope Coop at [facility] which was normal in November 2010. Denies weight loss, change in stool caliber, melena, hematochezia, nausea, vomiting, dysphagia, reflux symptoms, chest pain.  Review of Systems: Pertinent positive and negative review of systems were noted in the above HPI section. All other review of systems were otherwise negative.  Current Medications, Allergies, Past Medical History, Past Surgical History, Family History and Social History were reviewed in [employer] record.  Physical Exam: General: Well developed , well nourished, no acute distress Head: Normocephalic and atraumatic Eyes:  sclerae anicteric, EOMI Ears: Normal auditory acuity Mouth: No deformity or lesions Neck: Supple, no masses or thyromegaly Lungs: Clear throughout to auscultation Heart: Regular rate and rhythm; no murmurs, rubs or bruits Abdomen: Soft, mild lower abdominal tenderness to deep palpation without rebound or guarding  and non distended. No masses, hepatosplenomegaly or hernias noted. Normal Bowel sounds Musculoskeletal: Symmetrical with no gross deformities  Skin: No lesions on visible extremities Pulses:  Normal pulses noted Extremities: No clubbing, cyanosis, edema or deformities noted Neurological: Alert oriented x 4, grossly nonfocal Cervical Nodes:  No significant cervical adenopathy Inguinal Nodes: No significant inguinal adenopathy Psychological:  Alert and cooperative. Normal mood and affect  Assessment and Recommendations:  1. Lower abdominal pain.Viable bowel  habits with constipation reproducibly brought on by tramadol. She may have underlying irritable bowel syndrome and/or muscular lower abdominal pain possibly secondary to fibromyalgia. Obtain stool Hemoccults. Trial of glycopyrrolate 1 mg twice a day.

## 2013-07-23 NOTE — Patient Instructions (Addendum)
You have been given a separate informational sheet regarding your tobacco use, the importance of quitting and local resources to help you quit.  Your physician has requested that you go to the basement for the following lab work before leaving today: BUN, Creatinine.   We have sent the following medications to your pharmacy for you to pick up at your convenience:Robinul.   Follow instructions on Hemoccult cards and mail them back to [place] when finished.   You have been scheduled for a CT scan of the abdomen and pelvis at [facility] ([address]---this is in the same building as [facility]).   You are scheduled on 07/28/13 at 10:00am. You should arrive 15 minutes prior to your appointment time for registration. Please follow the written instructions below on the day of your exam:  WARNING: IF YOU ARE ALLERGIC TO IODINE/X-RAY DYE, PLEASE NOTIFY RADIOLOGY IMMEDIATELY AT [phone]! YOU WILL BE GIVEN A 13 HOUR PREMEDICATION PREP.  1) Do not eat or drink anything after 6:00am (4 hours prior to your test) 2) You have been given 2 bottles of oral contrast to drink. The solution may taste better if refrigerated, but do NOT add ice or any other liquid to this solution. Shake  well before drinking.    Drink 1 bottle of contrast @ 8:00am (2 hours prior to your exam)  Drink 1 bottle of contrast @ 9:00am (1 hour prior to your exam)  You may take any medications as prescribed with a small amount of water except for the following: Metformin, Glucophage, Glucovance, Avandamet, Riomet, Fortamet, Actoplus Met, Janumet, Glumetza or Metaglip. The above medications must be held the day of the exam AND 48 hours after the exam.  The purpose of you drinking the oral contrast is to aid in the visualization of your intestinal tract. The contrast solution may cause some diarrhea. Before your exam is started, you will be given a small amount of fluid to drink. Depending on your individual set of  symptoms, you may also receive an intravenous injection of x-ray contrast/dye. Plan on being at [facility] for 30 minutes or long, depending on the type of exam you are having performed.  This test typically takes 30-45 minutes to complete.  If you have any questions regarding your exam or if you need to reschedule, you may call the CT department at [phone] between the hours of 8:00 am and 5:00 pm, Monday-Friday.  ________________________________________________________________________  Thank you for choosing me and [facility].  Pricilla Riffle. Dagoberto Ligas., MD., [facility]

## 2013-07-25 ENCOUNTER — Other Ambulatory Visit: Payer: Self-pay | Admitting: *Deleted

## 2013-07-25 MED ORDER — GLUCOSE BLOOD VI STRP: ORAL_STRIP | Status: DC

## 2013-07-25 MED ORDER — ESOMEPRAZOLE MAGNESIUM 40 MG PO CPDR: 40.0000 mg | DELAYED_RELEASE_CAPSULE | Freq: Every day | ORAL | Status: DC

## 2013-07-25 MED ORDER — INSULIN GLARGINE 100 UNIT/ML ~~LOC~~ SOLN: SUBCUTANEOUS | Status: DC

## 2013-07-25 MED ORDER — INSULIN LISPRO 100 UNIT/ML ~~LOC~~ SOLN: SUBCUTANEOUS | Status: DC

## 2013-07-25 MED ORDER — GLUCAGON (RDNA) 1 MG IJ KIT: PACK | INTRAMUSCULAR | Status: DC

## 2013-07-28 ENCOUNTER — Ambulatory Visit (INDEPENDENT_AMBULATORY_CARE_PROVIDER_SITE_OTHER)
Admission: RE | Admit: 2013-07-28 | Discharge: 2013-07-28 | Disposition: A | Discharge status: Home / Self Care | Payer: Medicare HMO | Source: Ambulatory Visit | Attending: Gastroenterology | Admitting: Gastroenterology

## 2013-07-28 DIAGNOSIS — R109 Unspecified abdominal pain: Secondary | ICD-10-CM

## 2013-07-28 MED ORDER — IOHEXOL 300 MG/ML  SOLN
100.0000 mL | Freq: Once | INTRAMUSCULAR | Status: AC | PRN
  Administered 2013-07-28: 100 mL via INTRAVENOUS

## 2013-07-31 ENCOUNTER — Ambulatory Visit (INDEPENDENT_AMBULATORY_CARE_PROVIDER_SITE_OTHER): Payer: Medicare HMO | Admitting: Internal Medicine

## 2013-07-31 ENCOUNTER — Other Ambulatory Visit (INDEPENDENT_AMBULATORY_CARE_PROVIDER_SITE_OTHER): Payer: Medicare HMO

## 2013-07-31 ENCOUNTER — Encounter: Payer: Self-pay | Admitting: Internal Medicine

## 2013-07-31 ENCOUNTER — Telehealth: Payer: Self-pay | Admitting: *Deleted

## 2013-07-31 VITALS — BP 112/70 | HR 72 | Temp 98.5°F | Wt 137.0 lb

## 2013-07-31 DIAGNOSIS — Z23 Encounter for immunization: Secondary | ICD-10-CM

## 2013-07-31 DIAGNOSIS — R935 Abnormal findings on diagnostic imaging of other abdominal regions, including retroperitoneum: Secondary | ICD-10-CM

## 2013-07-31 DIAGNOSIS — N3090 Cystitis, unspecified without hematuria: Secondary | ICD-10-CM

## 2013-07-31 DIAGNOSIS — J42 Unspecified chronic bronchitis: Secondary | ICD-10-CM

## 2013-07-31 DIAGNOSIS — F17200 Nicotine dependence, unspecified, uncomplicated: Secondary | ICD-10-CM

## 2013-07-31 LAB — CBC WITH DIFFERENTIAL/PLATELET
BASOS PCT: 1.7 % (ref 0.0–3.0)
Basophils Absolute: 0.1 10*3/uL (ref 0.0–0.1)
EOS ABS: 0.2 10*3/uL (ref 0.0–0.7)
Eosinophils Relative: 3 % (ref 0.0–5.0)
HCT: 38.2 % (ref 36.0–46.0)
Hemoglobin: 12.1 g/dL (ref 12.0–15.0)
Lymphocytes Relative: 47.3 % — ABNORMAL HIGH (ref 12.0–46.0)
Lymphs Abs: 3.4 10*3/uL (ref 0.7–4.0)
MCHC: 31.6 g/dL (ref 30.0–36.0)
MCV: 83.7 fl (ref 78.0–100.0)
Monocytes Absolute: 0.6 10*3/uL (ref 0.1–1.0)
Monocytes Relative: 7.6 % (ref 3.0–12.0)
NEUTROS PCT: 40.4 % — AB (ref 43.0–77.0)
Neutro Abs: 2.9 10*3/uL (ref 1.4–7.7)
Platelets: 306 10*3/uL (ref 150.0–400.0)
RBC: 4.56 Mil/uL (ref 3.87–5.11)
RDW: 16.4 % — ABNORMAL HIGH (ref 11.5–14.6)
WBC: 7.2 10*3/uL (ref 4.5–10.5)

## 2013-07-31 LAB — URINALYSIS, ROUTINE W REFLEX MICROSCOPIC
Bilirubin Urine: NEGATIVE
Hgb urine dipstick: NEGATIVE
KETONES UR: NEGATIVE
LEUKOCYTES UA: NEGATIVE
Nitrite: NEGATIVE
PH: 8 (ref 5.0–8.0)
SPECIFIC GRAVITY, URINE: 1.01 (ref 1.000–1.030)
Total Protein, Urine: NEGATIVE
URINE GLUCOSE: NEGATIVE
Urobilinogen, UA: 0.2 (ref 0.0–1.0)

## 2013-07-31 MED ORDER — PROMETHAZINE-PHENYLEPHRINE 6.25-5 MG/5ML PO SYRP: 5.0000 mL | ORAL_SOLUTION | ORAL | Status: DC | PRN

## 2013-07-31 NOTE — Patient Instructions (Addendum)
It was good to see you today.  We have reviewed your prior records including labs and tests today  Test(s) ordered today. Your results will be released to [place] (or called to you) after review, usually within 72hours after test completion. If any changes need to be made, you will be notified at that same time.  we'll make referral to urology. Our office will contact you regarding appointment(s) once made.  Medications reviewed and updated Promethazine with decongestant for cough -  Your prescription(s) have been submitted to your pharmacy. Please take as directed and contact our office if you believe you are having problem(s) with the medication(s).  Continue to think about giving up cigarettes! Use nicotine gum, nicotine patches or electronic cigarettes. If you're interested in medication to help you quit, please call  - let me know how I can help!  Please schedule followup in 3-4 months, call sooner if problems.

## 2013-07-31 NOTE — Telephone Encounter (Signed)
Received fax stating Promethazine VC plain is on back order, and would it be ok to change to promethazine plain. Called [facility] back spoke with [place] gave verbal to change to plain...Leslie Lane

## 2013-07-31 NOTE — Progress Notes (Signed)
Pre-visit discussion using our clinic review tool. No additional management support is needed unless otherwise documented below in the visit note.  

## 2013-07-31 NOTE — Progress Notes (Signed)
Subjective:    Patient ID: Leslie Lane, female    DOB: [birth year], 58 y.o.   MRN: [id number]  HPI Here for follow up on CT a/p - nonsp cystitis/thickening   Also reviewed chronic medical issues today:  DM2 - insulin dep - follows with endo (kumar) for same - denies hx hypoglycemic signs or symptoms since summer 2011 (ER visit for same reviewed) - checks sugars 2-3x/d  depression, severe - history of same - intermittently follows with psyc and mental health for same - reports compliance with ongoing medical treatment and no changes in medication dose or frequency. denies adverse side effects related to current therapy. no SI/HI  migraines - follows with headache clinic for same ([facility]) - no change in meds  dyslipidemia - on statin for same since 2010- reports compliance with ongoing medical treatment and no changes in medication dose or frequency. denies adverse side effects related to current therapy.    Past Medical History  Diagnosis Date  . HEPATITIS B   . BIPOLAR AFFECTIVE DISORDER   . SMOKER   . MIGRAINE HEADACHE   . NECK PAIN, CHRONIC   . LOW BACK PAIN, CHRONIC   . ALLERGIC RHINITIS   . HYPERLIPIDEMIA   . GERD   . DIABETES MELLITUS, TYPE II     follows with endo  . DEPRESSION   . NARCOTIC ABUSE     hx of  . Anxiety   . Arthritis   . Fibromyalgia     Review of Systems  Constitutional: Negative for fever and fatigue.  Respiratory: Negative for cough and shortness of breath.   Cardiovascular: Negative for chest pain and leg swelling.  Endocrine: Negative for polyuria.  Genitourinary: Positive for pelvic pain (cramps/spasm related to food and BM). Negative for dysuria, frequency, hematuria, flank pain, difficulty urinating and dyspareunia.  Neurological: Negative for syncope and headaches.       Objective:   Physical Exam BP 112/70  Pulse 72  Temp(Src) 98.5 F (36.9 C) (Oral)  Wt 137 lb (62.143 kg)  SpO2 97% Wt Readings from Last 3 Encounters:  07/31/13  137 lb (62.143 kg)  07/23/13 136 lb 2 oz (61.746 kg)  06/13/13 135 lb 8 oz (61.462 kg)   Constitutional: She is well-developed and well-nourished. No distress. mom at side Neck: Normal range of motion. Neck supple. No JVD or LAD present. No thyromegaly present.  Cardiovascular: Normal rate, regular rhythm and normal heart sounds.  No murmur heard. No BLE edema, but +varicose veins distally. Pulmonary/Chest: Congested cough but effort normal and breath sounds normal. No respiratory distress. She has no wheezes Abdomen: SNTND, +BS Psychiatric: She has expansive mood and affect. Her behavior is normal. Judgment and thought content normal.   Lab Results  Component Value Date   WBC 8.2 07/16/2007   HGB 12.2 04/25/2011   HCT 36.0 04/25/2011   PLT 306 07/16/2007   CHOL 172 06/10/2013   TRIG 92.0 06/10/2013   HDL 56.60 06/10/2013   LDLDIRECT 101.7 03/26/2012   ALT 15 06/10/2013   AST 17 06/10/2013   NA 129* 06/10/2013   K 4.4 06/10/2013   CL 94* 06/10/2013   CREATININE 1.1 07/23/2013   BUN 11 07/23/2013   CO2 31 06/10/2013   TSH 0.94 02/06/2013   HGBA1C 8.2* 06/10/2013   MICROALBUR 0.1 06/10/2013       Assessment & Plan:   Abnormal CT - nonsp cystitis -  Few symptoms Discussed DDx FH sister with bladder ca and ongoing tobacco  abuse Will check UA and refer to uro  Chronic bronchitis, ongoing tobacco abuse. Exam with congested cough but no wheeze or crackle. Advised symptomatic care and tobacco cessation. Patient to call if fever, worsening symptoms or other problems despite symptomatic care See problem list. Medications and labs reviewed today.

## 2013-08-04 ENCOUNTER — Other Ambulatory Visit (INDEPENDENT_AMBULATORY_CARE_PROVIDER_SITE_OTHER): Payer: Medicare HMO

## 2013-08-04 DIAGNOSIS — R109 Unspecified abdominal pain: Secondary | ICD-10-CM

## 2013-08-04 LAB — HEMOCCULT SLIDES (X 3 CARDS)
FECAL OCCULT BLD: NEGATIVE
OCCULT 1: NEGATIVE
OCCULT 2: NEGATIVE
OCCULT 3: NEGATIVE
OCCULT 4: NEGATIVE
OCCULT 5: NEGATIVE

## 2013-08-15 ENCOUNTER — Encounter: Payer: Self-pay | Admitting: Endocrinology

## 2013-08-15 ENCOUNTER — Ambulatory Visit (INDEPENDENT_AMBULATORY_CARE_PROVIDER_SITE_OTHER): Payer: Commercial Managed Care - HMO | Admitting: Endocrinology

## 2013-08-15 VITALS — BP 128/64 | HR 97 | Temp 98.3°F | Resp 14 | Ht 64.0 in | Wt 136.4 lb

## 2013-08-15 DIAGNOSIS — IMO0002 Reserved for concepts with insufficient information to code with codable children: Secondary | ICD-10-CM

## 2013-08-15 DIAGNOSIS — E049 Nontoxic goiter, unspecified: Secondary | ICD-10-CM

## 2013-08-15 DIAGNOSIS — E7800 Pure hypercholesterolemia, unspecified: Secondary | ICD-10-CM

## 2013-08-15 DIAGNOSIS — E1065 Type 1 diabetes mellitus with hyperglycemia: Secondary | ICD-10-CM

## 2013-08-15 NOTE — Progress Notes (Signed)
Patient ID: GRACIN SOOHOO, female   DOB: [birth year], 58 y.o.   MRN: [id number]   Reason for Appointment: Diabetes follow-up   History of Present Illness   Diagnosis: Type 1 DIABETES MELITUS, diagnosed 1967     Insulin regimen: Lantus 11 units a.m., 7 units h.s. Humalog 1-3 p.r.n.  She has had labile blood sugar control over the last several years even though A1c has been usually around 7% She has had less lability and hypoglycemia with taking b.i.d. Lantus compared once a day She has been very sensitive to fast acting insulin and frequently does not require mealtime coverage  RECENT history: Her blood sugars are again fluctuating markedly She is still not able to balance out her meals and insulin and has an expected high and low sugars She is also having sporadic low sugars early morning and once overnight also. However has sporadic low sugars at all different times of the day She has been repeatedly  advised to avoid taking more than 2 units of Humalog but is still frequently taking 3 units for high readings Also may take extra insulin when she is eating snacks like popcorn which she thinks will increase her blood sugar Does not know how to adjust her Lantus based on fasting blood sugar trend A1c in December had increased possibly from less frequent hypoglycemia and also high readings in the evenings and overnight   HYPERGLYCEMIA occurs sporadically at midmorning, early evening and late evening with overall highest blood sugars midmorning and around 6 PM Probably this is related to frequent snacks in the afternoons or rebound from low sugars FASTING blood sugars are quite variable including a reading of 34 yesterday morning This is despite reducing her evening Lantus to 7 units  HYPOGLYCEMIA: This is mostly on waking up and sporadically at other times, yesterday she may have had low sugars because of being more active in the afternoon and still taking some Humalog before eating  popcorn She has 26 %of readings below 70 compared to 18 % previously Hypoglycemia at night is probably related to taking coverage for postprandial hyperglycemia at suppertime Rarely will have persistent hypoglycemia 2 times in a row and on 2/7 glucose was below 60 on day until 7 PM      DIET: She is usually not eating balanced meals and is eating high-fat snacks like ice cream, chips and popcorn at various times. Usually trying to avoid high-fat meals. She will eat more when she is anxious.  Monitors blood glucose:  4.4: times a day.    Glucometer: One Touch.      Blood Glucose readings from meter download:   PREMEAL Breakfast Lunch Dinner Bedtime Overall  Glucose range:  34-220   37-398   30-405   26-450  25-450  Mean/median:       132    Labs:  Lab Results  Component Value Date   HGBA1C 8.2* 06/10/2013   HGBA1C 7.4* 02/06/2013   HGBA1C 7.6* 09/24/2012   Lab Results  Component Value Date   MICROALBUR 0.1 06/10/2013   LDLCALC 97 06/10/2013   CREATININE 1.1 07/23/2013    No visits with results within 1 Week(s) from this visit. Latest known visit with results is:  Appointment on 08/04/2013  Component Date Value Ref Range Status  . OCCULT 1 08/04/2013 Negative  Negative Final  . OCCULT 2 08/04/2013 Negative  Negative Final  . OCCULT 3 08/04/2013 Negative  Negative Final  . OCCULT 4 08/04/2013 Negative  Negative Final  .  OCCULT 5 08/04/2013 Negative  Negative Final  . Fecal Occult Blood 08/04/2013 Negative  Negative Final     Wt Readings from Last 3 Encounters:  08/15/13 136 lb 6.4 oz (61.871 kg)  07/31/13 137 lb (62.143 kg)  07/23/13 136 lb 2 oz (61.746 kg)       Medication List       This list is accurate as of: 08/15/13 10:55 AM.  Always use your most recent med list.               aspirin 81 MG tablet  Take 81 mg by mouth daily.     esomeprazole 40 MG capsule  Commonly known as:  NEXIUM  Take 1 capsule (40 mg total) by mouth daily before breakfast.      glucagon 1 MG injection  Commonly known as:  GLUCAGON EMERGENCY  USE AS DIRECTED     glucose blood test strip  Commonly known as:  ONE TOUCH ULTRA TEST  Use to check blood sugars 4 times per day     glycopyrrolate 1 MG tablet  Commonly known as:  ROBINUL  Take 1 tablet (1 mg total) by mouth 2 (two) times daily.     ibuprofen 600 MG tablet  Commonly known as:  ADVIL,MOTRIN  Take 1 tablet (600 mg total) by mouth every 6 (six) hours as needed for pain.     insulin glargine 100 UNIT/ML injection  Commonly known as:  LANTUS  Inject 11 units into the skin in the morning as instructed and 6 units at night     insulin lispro 100 UNIT/ML injection  Commonly known as:  HUMALOG  Sliding scale 10 units in am and 8 units at night     loratadine 10 MG tablet  Commonly known as:  CLARITIN  Take 10 mg by mouth as needed for allergies.     losartan 25 MG tablet  Commonly known as:  COZAAR  TAKE 1 TABLET BY MOUTH DAILY     multivitamin tablet  Take 1 tablet by mouth daily.     promethazine-phenylephrine 6.25-5 MG/5ML Syrp  Commonly known as:  promethazine-phenylephrine  Take 5 mLs by mouth every 4 (four) hours as needed for congestion.     simvastatin 20 MG tablet  Commonly known as:  ZOCOR  Take 20 mg by mouth at bedtime.     traMADol 50 MG tablet  Commonly known as:  ULTRAM  TAKE 2 TABLETS BY MOUTH EVERY 8 HOURS AS NEEDED     traZODone 100 MG tablet  Commonly known as:  DESYREL  Take 1-2 tablets (100-200 mg total) by mouth at bedtime.     venlafaxine 37.5 MG tablet  Commonly known as:  EFFEXOR  Take 75 mg by mouth 2 (two) times daily with a meal.     vitamin A 10000 UNIT capsule  Take 10,000 Units by mouth daily.     vitamin B-12 1000 MCG tablet  Commonly known as:  CYANOCOBALAMIN  Take 1,000 mcg by mouth daily.     vitamin E 1000 UNIT capsule  Generic drug:  vitamin E  Take 1,000 Units by mouth daily.        Allergies:  Allergies  Allergen Reactions  .  Penicillins   . Sulfonamide Derivatives     Past Medical History  Diagnosis Date  . HEPATITIS B   . BIPOLAR AFFECTIVE DISORDER   . SMOKER   . MIGRAINE HEADACHE   . NECK PAIN, CHRONIC   .  LOW BACK PAIN, CHRONIC   . ALLERGIC RHINITIS   . HYPERLIPIDEMIA   . GERD   . DIABETES MELLITUS, TYPE II     follows with endo  . DEPRESSION   . NARCOTIC ABUSE     hx of  . Anxiety   . Arthritis   . Fibromyalgia     Past Surgical History  Procedure Laterality Date  . Cesarean section      Family History  Problem Relation Age of Onset  . Arthritis Mother   . Arthritis Father   . Kidney disease Father   . Kidney cancer Father   . Hyperlipidemia Other   . Kidney cancer Paternal Aunt   . Heart attack Brother     Social History:  reports that she has been smoking Cigarettes.  She has been smoking about 0.00 packs per day for the past 40 years. She has never used smokeless tobacco. She reports that she drinks alcohol. She reports that she does not use illicit drugs.  Review of Systems:  DEPRESSION: She has had long-standing depression and anxiety followed by psychiatrist   HYPERLIPIDEMIA: The lipid abnormality consists of elevated LDL previously treated  with simvastatin. She started having some aches in her arm but not all over and she stopped her simvastatin. Still having some pain in her arms. She thinks she has also been told to have fibromyalgia  She has been treated with low-dose Cozaar by PCP, no history of hypertension or microalbuminuria  Has history of small goiter but no hypothyroidism     Examination:   BP 128/64  Pulse 97  Temp(Src) 98.3 F (36.8 C)  Resp 14  Ht 5\' 4"  (1.626 m)  Wt 136 lb 6.4 oz (61.871 kg)  BMI 23.40 kg/m2  SpO2 94%  Body mass index is 23.4 kg/(m^2).   She is anxious No pedal edema.  ASSESSMENT/ PLAN::   Diabetes type 1  The patient's diabetes control is again difficult with having periodic hypoglycemia, more so on this visit See history  of present illness for details on current blood sugar patterns and home management  Problems identified:  Tendency to low blood sugars probably from getting excessive basal insulin or from increased activity; also may get low sugars related to overcorrection of high readings  One episode of nocturnal hypoglycemia and she continues to have significant hypoglycemia unawareness.  Periodic high readings as discussed in history of present illness related to inconsistent diet and snacks  Has had at least 3 sugars that are quite low in the morning waking up recently Recommendations were made: Insulin regimen: Reduce evening Lantus to 6 units h.s.  Do not take over 2 units Humalog at a time If very active in evening have bedtime snack with protein Discussed with her that her blood sugar targets are higher than for people without hypoglycemia unawareness and A1c is still reasonably good  Hyperlipidemia: She does not seem to have myopathy from simvastatin and she has taken this for a few years without side effects. Recommended that she go back on this  Counseling time over 50% of today's 25 minute visit  Jawan Chavarria 08/15/2013, 10:55 AM

## 2013-08-15 NOTE — Patient Instructions (Addendum)
Reduce Lantus in PM to 6 units Do not take over 2 units Humalog at a time If very active in evening have bedtime snack with protein  Simvastatin 1/2 daily

## 2013-08-21 ENCOUNTER — Other Ambulatory Visit: Payer: Self-pay | Admitting: Internal Medicine

## 2013-09-02 ENCOUNTER — Ambulatory Visit: Payer: Medicare HMO | Admitting: Internal Medicine

## 2013-09-10 ENCOUNTER — Telehealth: Payer: Self-pay | Admitting: *Deleted

## 2013-09-10 ENCOUNTER — Encounter: Payer: Self-pay | Admitting: Internal Medicine

## 2013-09-10 ENCOUNTER — Encounter: Payer: Self-pay | Admitting: *Deleted

## 2013-09-10 ENCOUNTER — Ambulatory Visit (INDEPENDENT_AMBULATORY_CARE_PROVIDER_SITE_OTHER): Payer: Medicare HMO | Admitting: Internal Medicine

## 2013-09-10 ENCOUNTER — Other Ambulatory Visit (INDEPENDENT_AMBULATORY_CARE_PROVIDER_SITE_OTHER): Payer: Medicare HMO

## 2013-09-10 VITALS — BP 110/60 | HR 82 | Temp 98.3°F | Wt 139.4 lb

## 2013-09-10 DIAGNOSIS — E785 Hyperlipidemia, unspecified: Secondary | ICD-10-CM

## 2013-09-10 DIAGNOSIS — E1065 Type 1 diabetes mellitus with hyperglycemia: Secondary | ICD-10-CM

## 2013-09-10 DIAGNOSIS — IMO0002 Reserved for concepts with insufficient information to code with codable children: Secondary | ICD-10-CM

## 2013-09-10 DIAGNOSIS — R9389 Abnormal findings on diagnostic imaging of other specified body structures: Secondary | ICD-10-CM

## 2013-09-10 DIAGNOSIS — F17200 Nicotine dependence, unspecified, uncomplicated: Secondary | ICD-10-CM

## 2013-09-10 DIAGNOSIS — R9341 Abnormal radiologic findings on diagnostic imaging of renal pelvis, ureter, or bladder: Secondary | ICD-10-CM | POA: Insufficient documentation

## 2013-09-10 DIAGNOSIS — E049 Nontoxic goiter, unspecified: Secondary | ICD-10-CM

## 2013-09-10 LAB — MICROALBUMIN / CREATININE URINE RATIO
Creatinine,U: 26.9 mg/dL
Microalb Creat Ratio: 0.7 mg/g (ref 0.0–30.0)
Microalb, Ur: 0.2 mg/dL (ref 0.0–1.9)

## 2013-09-10 LAB — BASIC METABOLIC PANEL
BUN: 13 mg/dL (ref 6–23)
CHLORIDE: 100 meq/L (ref 96–112)
CO2: 33 meq/L — AB (ref 19–32)
Calcium: 9.4 mg/dL (ref 8.4–10.5)
Creatinine, Ser: 0.8 mg/dL (ref 0.4–1.2)
GFR: 82.68 mL/min (ref >60.00)
GLUCOSE: 37 mg/dL — AB (ref 70–99)
POTASSIUM: 4.6 meq/L (ref 3.5–5.1)
Sodium: 138 mEq/L (ref 135–145)

## 2013-09-10 LAB — LIPID PANEL
CHOLESTEROL: 221 mg/dL — AB (ref 0–200)
HDL: 63.6 mg/dL (ref >39.00)
LDL Cholesterol: 144 mg/dL — ABNORMAL HIGH (ref 0–99)
Total CHOL/HDL Ratio: 3
Triglycerides: 65 mg/dL (ref 0.0–149.0)
VLDL: 13 mg/dL (ref 0.0–40.0)

## 2013-09-10 LAB — TSH: TSH: 1.23 u[IU]/mL (ref 0.35–5.50)

## 2013-09-10 LAB — HEMOGLOBIN A1C: HEMOGLOBIN A1C: 8.3 % — AB (ref 4.6–6.5)

## 2013-09-10 MED ORDER — LOSARTAN POTASSIUM 25 MG PO TABS: ORAL_TABLET | ORAL | Status: DC

## 2013-09-10 MED ORDER — LOVASTATIN 20 MG PO TABS: 20.0000 mg | ORAL_TABLET | Freq: Every day | ORAL | Status: DC

## 2013-09-10 MED ORDER — TRAMADOL HCL 50 MG PO TABS: 100.0000 mg | ORAL_TABLET | Freq: Two times a day (BID) | ORAL | Status: DC | PRN

## 2013-09-10 NOTE — Progress Notes (Signed)
Pre visit review using our clinic review tool, if applicable. No additional management support is needed unless otherwise documented below in the visit note. 

## 2013-09-10 NOTE — Telephone Encounter (Signed)
Lab called with critical glucose of 37

## 2013-09-10 NOTE — Telephone Encounter (Signed)
Discussed with patient. She had a glucose of 20 earlier today and apparently took extra insulin because she is eating more with stopping smoking. Advised her to avoid taking extra Humalog unless she is eating a very large meal

## 2013-09-10 NOTE — Assessment & Plan Note (Signed)
Noted 07/28/13 CT - s/p uro eval 08/2013 [place] - no evidence for abn but encouraged smoking cessation

## 2013-09-10 NOTE — Assessment & Plan Note (Addendum)
On statin - variable compliance reported due to myalgias Change to lovastatin - ex done Last lipids reviewed - at goal, check annually

## 2013-09-10 NOTE — Patient Instructions (Addendum)
It was good to see you today.  We have reviewed your prior records including labs and tests today  Test(s) ordered today. Return when you are fasting. Your results will be released to [place] (or called to you) after review, usually within 72hours after test completion. If any changes need to be made, you will be notified at that same time.  Medications reviewed and updated, change simvastatin to lovastatin -no other changes recommended at this time.  Your prescription(s) have been submitted to your pharmacy. Please take as directed and contact our office if you believe you are having problem(s) with the medication(s).  Please schedule followup in 6 months, call sooner if problems.  Continue to think about giving up cigarettes! Use nicotine gum, nicotine patches or electronic cigarettes. If you're interested in medication to help you quit, please call  - let me know how I can help!  You Can Quit Smoking If you are ready to quit smoking or are thinking about it, congratulations! You have chosen to help yourself be healthier and live longer! There are lots of different ways to quit smoking. Nicotine gum, nicotine patches, a nicotine inhaler, or nicotine nasal spray can help with physical craving. Hypnosis, support groups, and medicines help break the habit of smoking. TIPS TO GET OFF AND STAY OFF CIGARETTES  Learn to predict your moods. Do not let a bad situation be your excuse to have a cigarette. Some situations in your life might tempt you to have a cigarette.  Ask friends and co-workers not to smoke around you.  Make your home smoke-free.  Never have "just one" cigarette. It leads to wanting another and another. Remind yourself of your decision to quit.  On a card, make a list of your reasons for not smoking. Read it at least the same number of times a day as you have a cigarette. Tell yourself everyday, "I do not want to smoke. I choose not to smoke."  Ask someone at home or work to  help you with your plan to quit smoking.  Have something planned after you eat or have a cup of coffee. Take a walk or get other exercise to perk you up. This will help to keep you from overeating.  Try a relaxation exercise to calm you down and decrease your stress. Remember, you may be tense and nervous the first two weeks after you quit. This will pass.  Find new activities to keep your hands busy. Play with a pen, coin, or rubber band. Doodle or draw things on paper.  Brush your teeth right after eating. This will help cut down the craving for the taste of tobacco after meals. You can try mouthwash too.  Try gum, breath mints, or diet candy to keep something in your mouth. IF YOU SMOKE AND WANT TO QUIT:  Do not stock up on cigarettes. Never buy a carton. Wait until one pack is finished before you buy another.  Never carry cigarettes with you at work or at home.  Keep cigarettes as far away from you as possible. Leave them with someone else.  Never carry matches or a lighter with you.  Ask yourself, "Do I need this cigarette or is this just a reflex?"  Bet with someone that you can quit. Put cigarette money in a piggy bank every morning. If you smoke, you give up the money. If you do not smoke, by the end of the week, you keep the money.  Keep trying. It takes 21 days  to change a habit!  Talk to your doctor about using medicines to help you quit. These include nicotine replacement gum, lozenges, or skin patches. Document Released: 04/15/2009 Document Revised: 09/11/2011 Document Reviewed: 04/15/2009 [facility] Patient Information 2014 [place].

## 2013-09-10 NOTE — Assessment & Plan Note (Signed)
Follows with endo (kumar) for same - lantus bid and humalog ssi (rarely needs same)- Check a1c q 23mo as needed On statin and ARB  Lab Results  Component Value Date   HGBA1C 8.2* 06/10/2013

## 2013-09-10 NOTE — Progress Notes (Signed)
Subjective:    Patient ID: Leslie Lane, female    DOB: 1955-09-05, 58 y.o.   MRN: [id number]  HPI Here for follow up -reviewed chronic medical issues and interval events today:  nonsp cystitis/thickening on CT A/P 07/2013 - s/p uro eval for same - no evidence for TCC at this time -   DM2 - insulin dep - follows with endo (kumar) for same - denies hx hypoglycemic signs or symptoms since summer 2011 (ER visit for same reviewed) - checks sugars 2-3x/d  depression, severe - history of same - intermittently follows with psyc and mental health for same - reports compliance with ongoing medical treatment and no changes in medication dose or frequency. denies adverse side effects related to current therapy. no SI/HI  migraines - follows with headache clinic for same ([facility]) - no change in meds  dyslipidemia - on statin for same since 2010- reports compliance with ongoing medical treatment and no changes in medication dose or frequency. denies adverse side effects related to current therapy.    Past Medical History  Diagnosis Date  . HEPATITIS B   . BIPOLAR AFFECTIVE DISORDER   . SMOKER   . MIGRAINE HEADACHE   . NECK PAIN, CHRONIC   . LOW BACK PAIN, CHRONIC   . ALLERGIC RHINITIS   . HYPERLIPIDEMIA   . GERD   . DIABETES MELLITUS, TYPE II     follows with endo  . DEPRESSION   . NARCOTIC ABUSE     hx of  . Anxiety   . Arthritis   . Fibromyalgia     Review of Systems  Constitutional: Negative for fever, fatigue and unexpected weight change.  Respiratory: Negative for cough and shortness of breath.   Cardiovascular: Negative for chest pain, palpitations and leg swelling.  Genitourinary: Negative for hematuria and pelvic pain.       Objective:   Physical Exam BP 110/60  Pulse 82  Temp(Src) 98.3 F (36.8 C) (Oral)  Wt 139 lb 6.4 oz (63.231 kg)  SpO2 96% Wt Readings from Last 3 Encounters:  09/10/13 139 lb 6.4 oz (63.231 kg)  08/15/13 136 lb 6.4 oz (61.871 kg)  07/31/13  137 lb (62.143 kg)   Constitutional: She is well-developed and well-nourished. No distress. mom at side Neck: Normal range of motion. Neck supple. No JVD or LAD present. No thyromegaly present.  Cardiovascular: Normal rate, regular rhythm and normal heart sounds.  No murmur heard. No BLE edema, but +varicose veins distally. Pulmonary/Chest: effort normal and breath sounds. No respiratory distress. She has no wheezes Psychiatric: She has expansive mood and affect. Her behavior is normal. Judgment and thought content normal.   Lab Results  Component Value Date   WBC 7.2 07/31/2013   HGB 12.1 07/31/2013   HCT 38.2 07/31/2013   PLT 306.0 07/31/2013   CHOL 172 06/10/2013   TRIG 92.0 06/10/2013   HDL 56.60 06/10/2013   LDLDIRECT 101.7 03/26/2012   ALT 15 06/10/2013   AST 17 06/10/2013   NA 129* 06/10/2013   K 4.4 06/10/2013   CL 94* 06/10/2013   CREATININE 1.1 07/23/2013   BUN 11 07/23/2013   CO2 31 06/10/2013   TSH 0.94 02/06/2013   HGBA1C 8.2* 06/10/2013   MICROALBUR 0.1 06/10/2013       Assessment & Plan:   Problem List Items Addressed This Visit   Abnormal CT scan, bladder - Primary     Noted 07/28/13 CT - s/p uro eval 08/2013 [place] - no evidence  for abn but encouraged smoking cessation    HYPERLIPIDEMIA     On statin - variable compliance reported due to myalgias Change to lovastatin - ex done Last lipids reviewed - at goal, check annually    Relevant Medications      losartan (COZAAR) tablet      lovastatin (MEVACOR) tablet   Other Relevant Orders      Lipid panel   SMOKER   Type I (juvenile type) diabetes mellitus without mention of complication, uncontrolled      Follows with endo (kumar) for same - lantus bid and humalog ssi (rarely needs same)- Check a1c q 81mo as needed On statin and ARB  Lab Results  Component Value Date   HGBA1C 8.2* 06/10/2013

## 2013-10-13 ENCOUNTER — Other Ambulatory Visit (INDEPENDENT_AMBULATORY_CARE_PROVIDER_SITE_OTHER): Payer: Medicare HMO

## 2013-10-13 DIAGNOSIS — E7800 Pure hypercholesterolemia, unspecified: Secondary | ICD-10-CM

## 2013-10-13 LAB — LIPID PANEL
Cholesterol: 211 mg/dL — ABNORMAL HIGH (ref 0–200)
HDL: 69.2 mg/dL (ref >39.00)
LDL CALC: 125 mg/dL — AB (ref 0–99)
Total CHOL/HDL Ratio: 3
Triglycerides: 84 mg/dL (ref 0.0–149.0)
VLDL: 16.8 mg/dL (ref 0.0–40.0)

## 2013-10-15 ENCOUNTER — Ambulatory Visit: Payer: Commercial Managed Care - HMO

## 2013-10-15 DIAGNOSIS — E1065 Type 1 diabetes mellitus with hyperglycemia: Secondary | ICD-10-CM

## 2013-10-15 DIAGNOSIS — IMO0002 Reserved for concepts with insufficient information to code with codable children: Secondary | ICD-10-CM

## 2013-10-15 LAB — BASIC METABOLIC PANEL
BUN: 12 mg/dL (ref 6–23)
CALCIUM: 9.6 mg/dL (ref 8.4–10.5)
CO2: 28 meq/L (ref 19–32)
CREATININE: 0.8 mg/dL (ref 0.4–1.2)
Chloride: 101 mEq/L (ref 96–112)
GFR: 77.9 mL/min (ref >60.00)
Glucose, Bld: 129 mg/dL — ABNORMAL HIGH (ref 70–99)
Potassium: 4.7 mEq/L (ref 3.5–5.1)
SODIUM: 139 meq/L (ref 135–145)

## 2013-10-16 ENCOUNTER — Ambulatory Visit (INDEPENDENT_AMBULATORY_CARE_PROVIDER_SITE_OTHER): Payer: Medicare HMO | Admitting: Endocrinology

## 2013-10-16 ENCOUNTER — Other Ambulatory Visit: Payer: Self-pay | Admitting: Endocrinology

## 2013-10-16 ENCOUNTER — Encounter: Payer: Self-pay | Admitting: Endocrinology

## 2013-10-16 VITALS — BP 114/50 | HR 78 | Temp 98.1°F | Resp 16 | Ht 64.0 in | Wt 141.8 lb

## 2013-10-16 DIAGNOSIS — E1065 Type 1 diabetes mellitus with hyperglycemia: Secondary | ICD-10-CM

## 2013-10-16 DIAGNOSIS — E785 Hyperlipidemia, unspecified: Secondary | ICD-10-CM

## 2013-10-16 DIAGNOSIS — IMO0002 Reserved for concepts with insufficient information to code with codable children: Secondary | ICD-10-CM

## 2013-10-16 DIAGNOSIS — R413 Other amnesia: Secondary | ICD-10-CM

## 2013-10-16 MED ORDER — LOVASTATIN 40 MG PO TABS: 40.0000 mg | ORAL_TABLET | Freq: Every day | ORAL | Status: DC

## 2013-10-16 NOTE — Patient Instructions (Signed)
No Lantus tonight  Lantus 10 units in am and 6 in pm  Keep log of insulin doses daily

## 2013-10-16 NOTE — Progress Notes (Signed)
Patient ID: Leslie Lane, female   DOB: 30-Mar-1956, 58 y.o.   MRN: [id number]   Reason for Appointment: Diabetes follow-up   History of Present Illness   Diagnosis: Type 1 DIABETES MELITUS, diagnosed 1967     Insulin regimen: Lantus 11 units a.m., 6-7 units h.s. Humalog 1-3 p.r.n.  She has had labile blood sugar control over the last several years even though A1c has been usually around 7% She has had less lability and hypoglycemia with taking b.i.d. Lantus compared once a day She has been very sensitive to fast acting insulin and frequently does not require mealtime coverage  RECENT history: Her blood sugars are again fluctuating somewhat but overall she has had a tendency to low blood sugars frequently 37% of her readings below 70 which is more than the last time and this is despite reducing her evening insulin to 6 units She will again tend to adjust her Lantus based on how much he is eating especially in the evenings which will cause low blood sugars in the mornings at times Not clear why her blood sugars were ranging from 37-55 all day yesterday; she does not think she took her Lantus twice However she complains of having a bad memory and compliance may be in question  Her morning sugars are quite variable and occasionally may be higher from overnight eating She says she is concerned about getting high blood sugars and may be overdoing her Humalog at times Does not still adjust her evening Lantus based on fasting blood sugar trend A1c in March had increased to over 8%  HYPERGLYCEMIA occurs sporadically on waking up, early afternoon and after supper However her median blood sugar is only about 145 at the highest point of the day  HYPOGLYCEMIA: This is again at all different times but has not had any severe hypoglycemia and no overnight hypoglycemia Her mother knows how to give Glucagon injection She will take her low blood sugars usually with juice, does not like glucose tablets       DIET: She is usually not eating balanced meals and is eating high-fat snacks like chips and popcorn at various times.  Usually trying to avoid high-fat meals. She will eat more when she is anxious.  Monitors blood glucose:  4.4: times a day.    Glucometer: One Touch.      Blood Glucose readings from meter download:   PREMEAL Breakfast Lunch Dinner Bedtime Overall  Glucose range:  30-369   low-146   62-232   27-264   low-369   Mean/median:  105   71   130   74   102      Lab Results  Component Value Date   HGBA1C 8.3* 09/10/2013   HGBA1C 8.2* 06/10/2013   HGBA1C 7.4* 02/06/2013   Lab Results  Component Value Date   MICROALBUR 0.2 09/10/2013   LDLCALC 125* 10/13/2013   CREATININE 0.8 10/15/2013    Clinical Support on 10/15/2013  Component Date Value Ref Range Status  . Sodium 10/15/2013 139  135 - 145 mEq/L Final  . Potassium 10/15/2013 4.7  3.5 - 5.1 mEq/L Final  . Chloride 10/15/2013 101  96 - 112 mEq/L Final  . CO2 10/15/2013 28  19 - 32 mEq/L Final  . Glucose, Bld 10/15/2013 129* 70 - 99 mg/dL Final  . BUN 10/15/2013 12  6 - 23 mg/dL Final  . Creatinine, Ser 10/15/2013 0.8  0.4 - 1.2 mg/dL Final  . Calcium 10/15/2013 9.6  8.4 -  10.5 mg/dL Final  . GFR 10/15/2013 77.90  >60.00 mL/min Final  Appointment on 10/13/2013  Component Date Value Ref Range Status  . Cholesterol 10/13/2013 211* 0 - 200 mg/dL Final   ATP III Classification       Desirable:  < 200 mg/dL               Borderline High:  200 - 239 mg/dL          High:  > = 240 mg/dL  . Triglycerides 10/13/2013 84.0  0.0 - 149.0 mg/dL Final   Normal:  <150 mg/dLBorderline High:  150 - 199 mg/dL  . HDL 10/13/2013 69.20  >39.00 mg/dL Final  . VLDL 10/13/2013 16.8  0.0 - 40.0 mg/dL Final  . LDL Cholesterol 10/13/2013 125* 0 - 99 mg/dL Final  . Total CHOL/HDL Ratio 10/13/2013 3   Final                  Men          Women1/2 Average Risk     3.4          3.3Average Risk          5.0          4.42X Average Risk          9.6           7.13X Average Risk          15.0          11.0                         Wt Readings from Last 3 Encounters:  10/16/13 141 lb 12.8 oz (64.32 kg)  09/10/13 139 lb 6.4 oz (63.231 kg)  08/15/13 136 lb 6.4 oz (61.871 kg)       Medication List       This list is accurate as of: 10/16/13 10:06 AM.  Always use your most recent med list.               aspirin 81 MG tablet  Take 81 mg by mouth daily.     esomeprazole 40 MG capsule  Commonly known as:  NEXIUM  Take 1 capsule (40 mg total) by mouth daily before breakfast.     glucagon 1 MG injection  Commonly known as:  GLUCAGON EMERGENCY  USE AS DIRECTED     glucose blood test strip  Commonly known as:  ONE TOUCH ULTRA TEST  Use to check blood sugars 4 times per day     glycopyrrolate 1 MG tablet  Commonly known as:  ROBINUL  Take 1 tablet (1 mg total) by mouth 2 (two) times daily.     ibuprofen 600 MG tablet  Commonly known as:  ADVIL,MOTRIN  Take 1 tablet (600 mg total) by mouth every 6 (six) hours as needed for pain.     insulin glargine 100 UNIT/ML injection  Commonly known as:  LANTUS  Inject 11 units into the skin in the morning as instructed and 6 units at night     insulin lispro 100 UNIT/ML injection  Commonly known as:  HUMALOG  Sliding scale 10 units in am and 8 units at night     loratadine 10 MG tablet  Commonly known as:  CLARITIN  Take 10 mg by mouth as needed for allergies.     losartan 25 MG tablet  Commonly known as:  COZAAR  TAKE 1  TABLET BY MOUTH DAILY     lovastatin 20 MG tablet  Commonly known as:  MEVACOR  Take 1 tablet (20 mg total) by mouth at bedtime.     multivitamin tablet  Take 1 tablet by mouth daily.     traMADol 50 MG tablet  Commonly known as:  ULTRAM  Take 2 tablets (100 mg total) by mouth every 12 (twelve) hours as needed for moderate pain or severe pain.     traZODone 100 MG tablet  Commonly known as:  DESYREL  Take 1-2 tablets (100-200 mg total) by mouth at  bedtime.     venlafaxine XR 150 MG 24 hr capsule  Commonly known as:  EFFEXOR-XR  Take 150 mg by mouth daily with breakfast.     venlafaxine 37.5 MG tablet  Commonly known as:  EFFEXOR  Take 75 mg by mouth 2 (two) times daily with a meal.     vitamin A 10000 UNIT capsule  Take 10,000 Units by mouth daily.     vitamin B-12 1000 MCG tablet  Commonly known as:  CYANOCOBALAMIN  Take 1,000 mcg by mouth daily.     vitamin E 1000 UNIT capsule  Generic drug:  vitamin E  Take 1,000 Units by mouth daily.        Allergies:  Allergies  Allergen Reactions  . Penicillins   . Sulfonamide Derivatives     Past Medical History  Diagnosis Date  . HEPATITIS B   . BIPOLAR AFFECTIVE DISORDER   . SMOKER   . MIGRAINE HEADACHE   . NECK PAIN, CHRONIC   . LOW BACK PAIN, CHRONIC   . ALLERGIC RHINITIS   . HYPERLIPIDEMIA   . GERD   . DIABETES MELLITUS, TYPE II     follows with endo  . DEPRESSION   . NARCOTIC ABUSE     hx of  . Anxiety   . Arthritis   . Fibromyalgia     Past Surgical History  Procedure Laterality Date  . Cesarean section      Family History  Problem Relation Age of Onset  . Arthritis Mother   . Arthritis Father   . Kidney disease Father   . Kidney cancer Father   . Hyperlipidemia Other   . Kidney cancer Paternal Aunt   . Heart attack Brother   . Bladder Cancer Sister     Social History:  reports that she has been smoking Cigarettes.  She has been smoking about 0.00 packs per day for the past 40 years. She has never used smokeless tobacco. She reports that she drinks alcohol. She reports that she does not use illicit drugs.  Review of Systems:  DEPRESSION: She has had long-standing depression and anxiety followed by psychiatrist   HYPERLIPIDEMIA: The lipid abnormality consists of elevated LDL previously treated  with simvastatin. She started having some aches in her arm but not all over and she stopped her simvastatin. However she was still having some  muscle pains despite stopping the medication Now is on lovastatin 20 mg and does not complain of any unusual muscle aches Her LDL is not at target   She thinks she hasfibromyalgia  She has been treated with low-dose Cozaar by PCP, no history of hypertension or microalbuminuria  Has history of small goiter but no hypothyroidism     Examination:   BP 114/50  Pulse 78  Temp(Src) 98.1 F (36.7 C)  Resp 16  Ht 5\' 4"  (1.626 m)  Wt 141 lb 12.8 oz (  64.32 kg)  BMI 24.33 kg/m2  SpO2 97%  Body mass index is 24.33 kg/(m^2).    ASSESSMENT/ PLAN:   Diabetes type 1  The patient's diabetes control is again difficult with having frequent hypoglycemia, more so on this visit See history of present illness for details on current blood sugar patterns, insulin regimen and problems identified Discussed adjustment of Lantus based on fasting blood sugars and not based on how much he is eating Recommendations made today:  Not to take any Lantus tonight  Reduce morning Lantus to 10 units and take evening dose of 6 units consistently  Increase evening dose only if fasting blood sugars consistently high  The Humalog only if blood sugars are high before meals but only 1-2 units if checking postprandially  May have small protein containing bedtime snack regularly  Check blood sugar more frequently if more active and have a snack during activities  Keep daily record of her insulin doses for review on the next visit since she is having some memory issues  Hyperlipidemia: She does seem to be tolerating lovastatin and can try going up to 40 mg for better efficacy To check lipids on the next visit  Counseling time over 50% of today's 25 minute visit  [name] 10/16/2013, 10:06 AM

## 2013-10-22 ENCOUNTER — Other Ambulatory Visit: Payer: Self-pay | Admitting: Endocrinology

## 2013-11-14 ENCOUNTER — Other Ambulatory Visit: Payer: Self-pay | Admitting: *Deleted

## 2013-11-14 ENCOUNTER — Telehealth: Payer: Self-pay | Admitting: *Deleted

## 2013-11-14 MED ORDER — GLUCAGON (RDNA) 1 MG IJ KIT: 1.0000 mg | PACK | Freq: Once | INTRAMUSCULAR | Status: DC | PRN

## 2013-11-14 MED ORDER — PROMETHAZINE HCL 25 MG PO TABS: 25.0000 mg | ORAL_TABLET | Freq: Four times a day (QID) | ORAL | Status: DC | PRN

## 2013-11-14 NOTE — Telephone Encounter (Signed)
Noted, rx sent.

## 2013-11-14 NOTE — Telephone Encounter (Signed)
Patients mom called, she said Leslie Lane's sugar dropped to 28 this morning, she was vomiting and is very nauseous, mom wants to know if you can send something in to help with it?

## 2013-11-14 NOTE — Telephone Encounter (Signed)
Phenergan 25 mg tablets, one every 6 hours as needed, 6 tablets. She needs to reduce her evening Lantus by one unit

## 2013-11-25 ENCOUNTER — Other Ambulatory Visit (INDEPENDENT_AMBULATORY_CARE_PROVIDER_SITE_OTHER): Payer: Commercial Managed Care - HMO

## 2013-11-25 DIAGNOSIS — E785 Hyperlipidemia, unspecified: Secondary | ICD-10-CM

## 2013-11-25 DIAGNOSIS — E1065 Type 1 diabetes mellitus with hyperglycemia: Secondary | ICD-10-CM

## 2013-11-25 DIAGNOSIS — IMO0002 Reserved for concepts with insufficient information to code with codable children: Secondary | ICD-10-CM

## 2013-11-25 LAB — COMPREHENSIVE METABOLIC PANEL
ALK PHOS: 49 U/L (ref 39–117)
ALT: 17 U/L (ref 0–35)
AST: 19 U/L (ref 0–37)
Albumin: 3.4 g/dL — ABNORMAL LOW (ref 3.5–5.2)
BUN: 11 mg/dL (ref 6–23)
CO2: 31 mEq/L (ref 19–32)
CREATININE: 0.8 mg/dL (ref 0.4–1.2)
Calcium: 9.1 mg/dL (ref 8.4–10.5)
Chloride: 100 mEq/L (ref 96–112)
GFR: 80.18 mL/min (ref >60.00)
Glucose, Bld: 180 mg/dL — ABNORMAL HIGH (ref 70–99)
Potassium: 4.1 mEq/L (ref 3.5–5.1)
Sodium: 137 mEq/L (ref 135–145)
Total Bilirubin: 0.5 mg/dL (ref 0.2–1.2)
Total Protein: 6.4 g/dL (ref 6.0–8.3)

## 2013-11-25 LAB — LIPID PANEL
Cholesterol: 185 mg/dL (ref 0–200)
HDL: 80.5 mg/dL (ref >39.00)
LDL Cholesterol: 96 mg/dL (ref 0–99)
Total CHOL/HDL Ratio: 2
Triglycerides: 41 mg/dL (ref 0.0–149.0)
VLDL: 8.2 mg/dL (ref 0.0–40.0)

## 2013-11-25 LAB — HEMOGLOBIN A1C: HEMOGLOBIN A1C: 8 % — AB (ref 4.6–6.5)

## 2013-11-28 ENCOUNTER — Ambulatory Visit (INDEPENDENT_AMBULATORY_CARE_PROVIDER_SITE_OTHER): Payer: Commercial Managed Care - HMO | Admitting: Endocrinology

## 2013-11-28 ENCOUNTER — Encounter: Payer: Self-pay | Admitting: Endocrinology

## 2013-11-28 VITALS — BP 142/78 | HR 91 | Temp 97.7°F | Resp 16 | Ht 64.0 in | Wt 141.0 lb

## 2013-11-28 DIAGNOSIS — E785 Hyperlipidemia, unspecified: Secondary | ICD-10-CM

## 2013-11-28 DIAGNOSIS — IMO0002 Reserved for concepts with insufficient information to code with codable children: Secondary | ICD-10-CM

## 2013-11-28 DIAGNOSIS — E1065 Type 1 diabetes mellitus with hyperglycemia: Secondary | ICD-10-CM

## 2013-11-28 NOTE — Progress Notes (Signed)
Patient ID: Leslie Lane, female   DOB: [birth year], 58 y.o.   MRN: [id number]   Reason for Appointment: Diabetes follow-up   History of Present Illness   Diagnosis: Type 1 DIABETES MELITUS, diagnosed 1967     Insulin regimen: Lantus 10 units a.m., 7 units h.s. Humalog 1-3 p.r.n.  She has had labile blood sugar control over the last several years even though A1c has been usually around 7% She has had less lability and hypoglycemia with taking b.i.d. Lantus compared once a day She has been very sensitive to fast acting insulin and frequently does not require mealtime coverage  RECENT history: Her blood sugars are again tend to get low periodically and she had an episode of severe hypoglycemia requiring Glucagon about 2 weeks ago Detailed discussion was done today on day-to-day management of her diabetes Problems identified:   She will again tend to adjust her Lantus based on how much he is eating especially in the evenings which will cause low blood sugars in the mornings at times  Also has not reduced her evening Lantus as directed on the last visit  She gets anxious when her blood sugar goes up and takes correction doses of Humalog, this will periodically result in hypoglycemia  Her diet is still variable including snacking  She does not remember instructions given in the office because of memory issues  She does not take any precautions with extra snacks when she is much more active and this will cause hypoglycemia  Again does not seem to require any mealtime coverage unless she has very large amounts of carbohydrate or fat  Her morning sugars are quite variable but overall fairly good recently and low only yesterday; also low normal today Her A1c consistently tends to be about 8%  HYPERGLYCEMIA occurs sporadically on waking up, early afternoon and after supper Now her median blood sugar is 175 at the highest point of the day which is midmorning  HYPOGLYCEMIA: This is not  as frequent as her last visit, most often around 6 PM and no overnight hypoglycemia Her mother knows how to give Glucagon injection She will take her low blood sugars usually with juice, does not like glucose tablets      DIET: She is usually not eating balanced meals and is eating high-fat snacks like chips and popcorn at various times.  Usually trying to avoid high-fat meals. She will eat more when she is anxious.  Monitors blood glucose:  4.4: times a day.    Glucometer: One Touch.      Blood Glucose readings from meter download:   PREMEAL Breakfast Lunch Dinner Bedtime Overall  Glucose range:  46-170   40-273   36-290   65-238    Mean/median:  155    142   147   161+/-68    POST-MEAL PC Breakfast PC Lunch PC Dinner  Glucose range:  99-367   36-275    Mean/median:        Lab Results  Component Value Date   HGBA1C 8.0* 11/25/2013   HGBA1C 8.3* 09/10/2013   HGBA1C 8.2* 06/10/2013   Lab Results  Component Value Date   MICROALBUR 0.2 09/10/2013   LDLCALC 96 11/25/2013   CREATININE 0.8 11/25/2013    Appointment on 11/25/2013  Component Date Value Ref Range Status  . Hemoglobin A1C 11/25/2013 8.0* 4.6 - 6.5 % Final   Glycemic Control Guidelines for People with Diabetes:Non Diabetic:  <6%Goal of Therapy: <7%Additional Action Suggested:  >8%   .  Sodium 11/25/2013 137  135 - 145 mEq/L Final  . Potassium 11/25/2013 4.1  3.5 - 5.1 mEq/L Final  . Chloride 11/25/2013 100  96 - 112 mEq/L Final  . CO2 11/25/2013 31  19 - 32 mEq/L Final  . Glucose, Bld 11/25/2013 180* 70 - 99 mg/dL Final  . BUN 11/25/2013 11  6 - 23 mg/dL Final  . Creatinine, Ser 11/25/2013 0.8  0.4 - 1.2 mg/dL Final  . Total Bilirubin 11/25/2013 0.5  0.2 - 1.2 mg/dL Final  . Alkaline Phosphatase 11/25/2013 49  39 - 117 U/L Final  . AST 11/25/2013 19  0 - 37 U/L Final  . ALT 11/25/2013 17  0 - 35 U/L Final  . Total Protein 11/25/2013 6.4  6.0 - 8.3 g/dL Final  . Albumin 11/25/2013 3.4* 3.5 - 5.2 g/dL Final  . Calcium  11/25/2013 9.1  8.4 - 10.5 mg/dL Final  . GFR 11/25/2013 80.18  >60.00 mL/min Final  . Cholesterol 11/25/2013 185  0 - 200 mg/dL Final   ATP III Classification       Desirable:  < 200 mg/dL               Borderline High:  200 - 239 mg/dL          High:  > = 240 mg/dL  . Triglycerides 11/25/2013 41.0  0.0 - 149.0 mg/dL Final   Normal:  <150 mg/dLBorderline High:  150 - 199 mg/dL  . HDL 11/25/2013 80.50  >39.00 mg/dL Final  . VLDL 11/25/2013 8.2  0.0 - 40.0 mg/dL Final  . LDL Cholesterol 11/25/2013 96  0 - 99 mg/dL Final  . Total CHOL/HDL Ratio 11/25/2013 2   Final                  Men          Women1/2 Average Risk     3.4          3.3Average Risk          5.0          4.42X Average Risk          9.6          7.13X Average Risk          15.0          11.0                         Wt Readings from Last 3 Encounters:  11/28/13 141 lb (63.957 kg)  10/16/13 141 lb 12.8 oz (64.32 kg)  09/10/13 139 lb 6.4 oz (63.231 kg)       Medication List       This list is accurate as of: 11/28/13  3:26 PM.  Always use your most recent med list.               aspirin 81 MG tablet  Take 81 mg by mouth daily.     esomeprazole 40 MG capsule  Commonly known as:  NEXIUM  Take 1 capsule (40 mg total) by mouth daily before breakfast.     glucagon 1 MG injection  Commonly known as:  GLUCAGON EMERGENCY  Inject 1 mg into the skin once as needed.     glucose blood test strip  Commonly known as:  ONE TOUCH ULTRA TEST  Use to check blood sugars 4 times per day     glycopyrrolate 1 MG tablet  Commonly  known as:  ROBINUL  Take 1 tablet (1 mg total) by mouth 2 (two) times daily.     ibuprofen 600 MG tablet  Commonly known as:  ADVIL,MOTRIN  Take 1 tablet (600 mg total) by mouth every 6 (six) hours as needed for pain.     insulin lispro 100 UNIT/ML injection  Commonly known as:  HUMALOG  Sliding scale 10 units in am and 8 units at night     LANTUS 100 UNIT/ML injection  Generic drug:  insulin  glargine  INJECT 9 UNITS INTO THE SKIN IN THE MORNING AS INSTRUCTED (DISCARD VIAL 28 DAYS AFTER OPENING)     loratadine 10 MG tablet  Commonly known as:  CLARITIN  Take 10 mg by mouth as needed for allergies.     losartan 25 MG tablet  Commonly known as:  COZAAR  TAKE 1 TABLET BY MOUTH DAILY     lovastatin 40 MG tablet  Commonly known as:  MEVACOR  Take 1 tablet (40 mg total) by mouth at bedtime.     multivitamin tablet  Take 1 tablet by mouth daily.     promethazine 25 MG tablet  Commonly known as:  PHENERGAN  Take 1 tablet (25 mg total) by mouth every 6 (six) hours as needed for nausea or vomiting.     traMADol 50 MG tablet  Commonly known as:  ULTRAM  Take 2 tablets (100 mg total) by mouth every 12 (twelve) hours as needed for moderate pain or severe pain.     traZODone 100 MG tablet  Commonly known as:  DESYREL  Take 1-2 tablets (100-200 mg total) by mouth at bedtime.     venlafaxine XR 150 MG 24 hr capsule  Commonly known as:  EFFEXOR-XR  Take 150 mg by mouth daily with breakfast.     vitamin A 10000 UNIT capsule  Take 10,000 Units by mouth daily.     vitamin B-12 1000 MCG tablet  Commonly known as:  CYANOCOBALAMIN  Take 1,000 mcg by mouth daily.     vitamin E 1000 UNIT capsule  Generic drug:  vitamin E  Take 1,000 Units by mouth daily.        Allergies:  Allergies  Allergen Reactions  . Penicillins   . Sulfonamide Derivatives     Past Medical History  Diagnosis Date  . HEPATITIS B   . BIPOLAR AFFECTIVE DISORDER   . SMOKER   . MIGRAINE HEADACHE   . NECK PAIN, CHRONIC   . LOW BACK PAIN, CHRONIC   . ALLERGIC RHINITIS   . HYPERLIPIDEMIA   . GERD   . DIABETES MELLITUS, TYPE II     follows with endo  . DEPRESSION   . NARCOTIC ABUSE     hx of  . Anxiety   . Arthritis   . Fibromyalgia     Past Surgical History  Procedure Laterality Date  . Cesarean section      Family History  Problem Relation Age of Onset  . Arthritis Mother   .  Arthritis Father   . Kidney disease Father   . Kidney cancer Father   . Hyperlipidemia Other   . Kidney cancer Paternal Aunt   . Heart attack Brother   . Bladder Cancer Sister     Social History:  reports that she has been smoking Cigarettes.  She has been smoking about 0.00 packs per day for the past 40 years. She has never used smokeless tobacco. She reports that she drinks alcohol.  She reports that she does not use illicit drugs.  Review of Systems:  DEPRESSION: She has had long-standing depression and anxiety followed by psychiatrist   HYPERLIPIDEMIA: The lipid abnormality consists of elevated LDL previously treated  with simvastatin. She started having some aches in her arm but not all over and she stopped her simvastatin. However she was still having some muscle pains despite stopping the medication Now is on lovastatin 20 mg and does not complain of any unusual muscle aches Her LDL is not at target  ? Fibromyalgia  She has been treated with low-dose Cozaar by PCP, no history of hypertension or microalbuminuria  Has history of small goiter but no hypothyroidism     Examination:   BP 142/78  Pulse 91  Temp(Src) 97.7 F (36.5 C)  Resp 16  Ht 5\' 4"  (1.626 m)  Wt 141 lb (63.957 kg)  BMI 24.19 kg/m2  SpO2 95%  Body mass index is 24.19 kg/(m^2).    ASSESSMENT/ PLAN:   Diabetes type 1  The patient's diabetes control is again difficult with having periodic hypoglycemia and also having hypoglycemia unawareness See history of present illness for details on current blood sugar patterns, insulin regimen and problems identified Reminded her on adjustment of Lantus based on fasting blood sugars and not based on how much she is eating late in the evening Recommendations made today were given in writing:   After housework or increased physical activity eat a snack   If sugar over 200 may take 1 unit Humalog OR exercise not both  If sugar > 300 may take 2 units Humalog  May  take 1 unit of Humalog for very large carbohydrate intake  Lantus 5 units in pm and not to increase and this morning sugars consistently high  Counseling time over 50% of today's 25 minute visit  [name] 11/28/2013, 3:26 PM

## 2013-11-28 NOTE — Patient Instructions (Signed)
After housework eat a snack   If sugar over 200 may take 1 unit OR exercise not both  If sugar > 300 may take 2 units  Lantus 5 units in pm

## 2014-01-05 ENCOUNTER — Other Ambulatory Visit: Payer: Self-pay | Admitting: Endocrinology

## 2014-01-28 ENCOUNTER — Encounter: Payer: Self-pay | Admitting: Endocrinology

## 2014-01-28 ENCOUNTER — Ambulatory Visit (INDEPENDENT_AMBULATORY_CARE_PROVIDER_SITE_OTHER): Payer: Commercial Managed Care - HMO | Admitting: Endocrinology

## 2014-01-28 VITALS — BP 125/62 | HR 85 | Temp 97.7°F | Resp 16 | Ht 64.0 in | Wt 138.4 lb

## 2014-01-28 DIAGNOSIS — E1065 Type 1 diabetes mellitus with hyperglycemia: Secondary | ICD-10-CM

## 2014-01-28 DIAGNOSIS — E049 Nontoxic goiter, unspecified: Secondary | ICD-10-CM

## 2014-01-28 DIAGNOSIS — IMO0002 Reserved for concepts with insufficient information to code with codable children: Secondary | ICD-10-CM

## 2014-01-28 NOTE — Progress Notes (Signed)
Patient ID: Leslie Lane, female   DOB: 03-31-1956, 58 y.o.   MRN: [id number]   Reason for Appointment: Diabetes follow-up   History of Present Illness   Diagnosis: Type 1 DIABETES MELITUS, diagnosed 1967     Insulin regimen: Lantus 10 units a.m., 8 units h.s. Humalog 1-2 p.r.n. With vial  She has had labile blood sugar control over the last several years even though A1c has been usually around 7% She has had less lability and hypoglycemia with taking b.i.d. Lantus compared once a day She has been very sensitive to fast acting insulin and frequently does not require mealtime coverage  RECENT history: Her blood sugars are not getting as low as before and she has not had any severe hypoglycemia Although on an average her blood sugars are about the same as before she has only eat readings in the last 2 weeks below 70 Problems and blood sugar patterns identified:   She thinks her difficulties with control related to inconsistent diet and sometimes eating sweets and large amounts of carbohydrates  She does not reduce her evening Lantus as directed since she thinks she is eating more in the evening. She was told to reduce her evening Lantus to 5 units on the last visit  She gets anxious when her blood sugar goes up and takes 1-2 units of Humalog and this is usually taken postprandially, this sometimes will cause hypoglycemia. Also see thinks that if her blood sugar goes up she will get a yeast infection  Transthoracic readings over 200 at various times but overall the highest midday and afternoon  Again does not seem to require any mealtime coverage unless she has very large amounts of carbohydrate or fat  However does not take Humalog when she has eaten those kinds of foods including ice cream which will raise her blood sugar  Her morning sugars are  variable but overall fairly good recently and low only once Her A1c consistently tends to be about 8%, last reading was in  5/15  HYPOGLYCEMIA: This is not as frequent as her last visit, most often late in the evening a couple of hours after her Lantus Her mother knows how to give Glucagon injection She will take her low blood sugars usually with juice, does not like glucose tablets      DIET: She is usually not eating balanced meals and is eating high-fat snacks like chips and popcorn at various times.  Usually trying to avoid high-fat meals. She will eat more when she is anxious.  Monitors blood glucose: 5.1 times a day.    Glucometer: One Touch.      Blood Glucose readings from meter download:   PREMEAL Breakfast Lunch Dinner Bedtime Overall  Glucose range:  59-245     45- 233  34-302   Mean/median:  160       151+/-68     Lab Results  Component Value Date   HGBA1C 8.0* 11/25/2013   HGBA1C 8.3* 09/10/2013   HGBA1C 8.2* 06/10/2013   Lab Results  Component Value Date   MICROALBUR 0.2 09/10/2013   LDLCALC 96 11/25/2013   CREATININE 0.8 11/25/2013    No visits with results within 1 Week(s) from this visit. Latest known visit with results is:  Appointment on 11/25/2013  Component Date Value Ref Range Status  . Hemoglobin A1C 11/25/2013 8.0* 4.6 - 6.5 % Final   Glycemic Control Guidelines for People with Diabetes:Non Diabetic:  <6%Goal of Therapy: <7%Additional Action Suggested:  >8%   .  Sodium 11/25/2013 137  135 - 145 mEq/L Final  . Potassium 11/25/2013 4.1  3.5 - 5.1 mEq/L Final  . Chloride 11/25/2013 100  96 - 112 mEq/L Final  . CO2 11/25/2013 31  19 - 32 mEq/L Final  . Glucose, Bld 11/25/2013 180* 70 - 99 mg/dL Final  . BUN 11/25/2013 11  6 - 23 mg/dL Final  . Creatinine, Ser 11/25/2013 0.8  0.4 - 1.2 mg/dL Final  . Total Bilirubin 11/25/2013 0.5  0.2 - 1.2 mg/dL Final  . Alkaline Phosphatase 11/25/2013 49  39 - 117 U/L Final  . AST 11/25/2013 19  0 - 37 U/L Final  . ALT 11/25/2013 17  0 - 35 U/L Final  . Total Protein 11/25/2013 6.4  6.0 - 8.3 g/dL Final  . Albumin 11/25/2013 3.4* 3.5 - 5.2  g/dL Final  . Calcium 11/25/2013 9.1  8.4 - 10.5 mg/dL Final  . GFR 11/25/2013 80.18  >60.00 mL/min Final  . Cholesterol 11/25/2013 185  0 - 200 mg/dL Final   ATP III Classification       Desirable:  < 200 mg/dL               Borderline High:  200 - 239 mg/dL          High:  > = 240 mg/dL  . Triglycerides 11/25/2013 41.0  0.0 - 149.0 mg/dL Final   Normal:  <150 mg/dLBorderline High:  150 - 199 mg/dL  . HDL 11/25/2013 80.50  >39.00 mg/dL Final  . VLDL 11/25/2013 8.2  0.0 - 40.0 mg/dL Final  . LDL Cholesterol 11/25/2013 96  0 - 99 mg/dL Final  . Total CHOL/HDL Ratio 11/25/2013 2   Final                  Men          Women1/2 Average Risk     3.4          3.3Average Risk          5.0          4.42X Average Risk          9.6          7.13X Average Risk          15.0          11.0                         Wt Readings from Last 3 Encounters:  01/28/14 138 lb 6.4 oz (62.778 kg)  11/28/13 141 lb (63.957 kg)  10/16/13 141 lb 12.8 oz (64.32 kg)       Medication List       This list is accurate as of: 01/28/14  8:55 PM.  Always use your most recent med list.               aspirin 81 MG tablet  Take 81 mg by mouth daily.     esomeprazole 40 MG capsule  Commonly known as:  NEXIUM  Take 1 capsule (40 mg total) by mouth daily before breakfast.     glucagon 1 MG injection  Commonly known as:  GLUCAGON EMERGENCY  Inject 1 mg into the skin once as needed.     glycopyrrolate 1 MG tablet  Commonly known as:  ROBINUL  Take 1 tablet (1 mg total) by mouth 2 (two) times daily.     ibuprofen 600 MG tablet  Commonly  known as:  ADVIL,MOTRIN  Take 1 tablet (600 mg total) by mouth every 6 (six) hours as needed for pain.     insulin lispro 100 UNIT/ML injection  Commonly known as:  HUMALOG  Sliding scale 10 units in am and 8 units at night     LANTUS 100 UNIT/ML injection  Generic drug:  insulin glargine  INJECT 9 UNITS INTO THE SKIN IN THE MORNING AS INSTRUCTED (DISCARD VIAL 28 DAYS AFTER  OPENING)     loratadine 10 MG tablet  Commonly known as:  CLARITIN  Take 10 mg by mouth as needed for allergies.     losartan 25 MG tablet  Commonly known as:  COZAAR  TAKE 1 TABLET BY MOUTH DAILY     lovastatin 40 MG tablet  Commonly known as:  MEVACOR  Take 1 tablet (40 mg total) by mouth at bedtime.     multivitamin tablet  Take 1 tablet by mouth daily.     ONE TOUCH ULTRA TEST test strip  Generic drug:  glucose blood  USE  TO CHECK BLOOD SUGAR FOUR TIMES DAILY     traMADol 50 MG tablet  Commonly known as:  ULTRAM  Take 2 tablets (100 mg total) by mouth every 12 (twelve) hours as needed for moderate pain or severe pain.     traZODone 100 MG tablet  Commonly known as:  DESYREL  Take 1-2 tablets (100-200 mg total) by mouth at bedtime.     venlafaxine XR 150 MG 24 hr capsule  Commonly known as:  EFFEXOR-XR  Take 150 mg by mouth daily with breakfast.     vitamin A 10000 UNIT capsule  Take 10,000 Units by mouth daily.     vitamin B-12 1000 MCG tablet  Commonly known as:  CYANOCOBALAMIN  Take 1,000 mcg by mouth daily.     vitamin E 1000 UNIT capsule  Generic drug:  vitamin E  Take 1,000 Units by mouth daily.        Allergies:  Allergies  Allergen Reactions  . Penicillins   . Sulfonamide Derivatives     Past Medical History  Diagnosis Date  . HEPATITIS B   . BIPOLAR AFFECTIVE DISORDER   . SMOKER   . MIGRAINE HEADACHE   . NECK PAIN, CHRONIC   . LOW BACK PAIN, CHRONIC   . ALLERGIC RHINITIS   . HYPERLIPIDEMIA   . GERD   . DIABETES MELLITUS, TYPE II     follows with endo  . DEPRESSION   . NARCOTIC ABUSE     hx of  . Anxiety   . Arthritis   . Fibromyalgia     Past Surgical History  Procedure Laterality Date  . Cesarean section      Family History  Problem Relation Age of Onset  . Arthritis Mother   . Arthritis Father   . Kidney disease Father   . Kidney cancer Father   . Hyperlipidemia Other   . Kidney cancer Paternal Aunt   . Heart  attack Brother   . Bladder Cancer Sister     Social History:  reports that she has been smoking Cigarettes.  She has been smoking about 0.00 packs per day for the past 40 years. She has never used smokeless tobacco. She reports that she drinks alcohol. She reports that she does not use illicit drugs.  Review of Systems:  DEPRESSION: She has had long-standing depression and anxiety followed by the mental health clinic and she does not like going  there. However has not explored seeing a psychiatrist otherwise  HYPERLIPIDEMIA: The lipid abnormality consists of elevated LDL previously treated  with simvastatin.  Now is on lovastatin 40 mg and does not complain of any unusual muscle aches Her LDL is  at target with the higher dose of lovastatin  Lab Results  Component Value Date   CHOL 185 11/25/2013   HDL 80.50 11/25/2013   LDLCALC 96 11/25/2013   LDLDIRECT 101.7 03/26/2012   TRIG 41.0 11/25/2013   CHOLHDL 2 11/25/2013     She has been treated with low-dose Cozaar by PCP, no history of hypertension or microalbuminuria  Has history of small goiter but no hypothyroidism     Examination:   BP 125/62  Pulse 85  Temp(Src) 97.7 F (36.5 C)  Resp 16  Ht 5\' 4"  (1.626 m)  Wt 138 lb 6.4 oz (62.778 kg)  BMI 23.74 kg/m2  SpO2 97%  Body mass index is 23.74 kg/(m^2).   She appears anxious and restless, fidgety No ankle edema  ASSESSMENT/ PLAN:   Diabetes type 1  The patient's diabetes control is overall somewhat better with less variability but she still has periods of hyperglycemia and hypoglycemia See history of present illness for details on current blood sugar patterns, insulin regimen and problems identified She is very sensitive to small doses of Humalog and is still sometimes cause hypoglycemia; discussed that she should not take this postprandially but more often right at mealtimes when she has eaten a large amount of carbohydrate or fatty foods Since her blood sugars are in the  lowest at bedtime we'll reduce her Lantus by at least one unit May have extra snacks when more active Recommendations made today were reviewed with the patient on the visit summary  Depression: She needs to be seen by a psychiatrist and she can check with her insurance on her preferred providers  Patient instructions: May take 1 unit Humalog at the time of eating a large amount of carbs or ice cream  Reduce Lantus to 7 units in the evening  Look up Psychiatrists on your plan and schedule appointment  Hyperlipidemia: Adequately controlled and she needs to continue lovastatin  Counseling time over 50% of today's 25 minute visit  Leslie Lane 01/28/2014, 8:55 PM

## 2014-01-28 NOTE — Patient Instructions (Addendum)
May take 1 unit Humalog at the time of eating a large amount of carbs or ice cream  Reduce Lantus to 7 units in the evening  Look up Psychiatrists on your plan and schedule appointment

## 2014-02-23 ENCOUNTER — Other Ambulatory Visit: Payer: Self-pay | Admitting: Endocrinology

## 2014-02-23 DIAGNOSIS — E119 Type 2 diabetes mellitus without complications: Secondary | ICD-10-CM

## 2014-03-03 ENCOUNTER — Other Ambulatory Visit: Payer: Self-pay | Admitting: Endocrinology

## 2014-03-05 ENCOUNTER — Telehealth: Payer: Self-pay | Admitting: Internal Medicine

## 2014-03-05 MED ORDER — LOSARTAN POTASSIUM 25 MG PO TABS: ORAL_TABLET | ORAL | Status: DC

## 2014-03-05 NOTE — Telephone Encounter (Signed)
Had to move patient appt to October b/c provider will be out of the office.  She is requesting refill on losartan to get her through.  Patient states she has stopped going to mental health b/c she is having to pay cash.  She states she can not sleep and is requesting a script for this.  Please advise.

## 2014-03-13 ENCOUNTER — Ambulatory Visit: Payer: Medicare HMO | Admitting: Internal Medicine

## 2014-03-18 ENCOUNTER — Other Ambulatory Visit: Payer: Self-pay | Admitting: Endocrinology

## 2014-03-25 ENCOUNTER — Telehealth: Payer: Self-pay | Admitting: Internal Medicine

## 2014-03-25 DIAGNOSIS — L739 Follicular disorder, unspecified: Secondary | ICD-10-CM

## 2014-03-25 NOTE — Telephone Encounter (Signed)
Pt called in and has bumps in her head again an is wanting another referral to go back and see :  Dr Allyson Sabal  [facility]  [phone]

## 2014-03-26 NOTE — Telephone Encounter (Signed)
Derm refer done

## 2014-04-01 ENCOUNTER — Telehealth: Payer: Self-pay | Admitting: Endocrinology

## 2014-04-01 NOTE — Telephone Encounter (Signed)
Pt has painful knots on her scalp with acne and she needs a referral to the dermatologist - Dr. Allyson Sabal on [address] # [phone] please advise  Please call pt if this can be done by [place].

## 2014-04-01 NOTE — Telephone Encounter (Signed)
PCP?

## 2014-04-01 NOTE — Telephone Encounter (Signed)
Please see below, can you do this or does her PCP need to?

## 2014-04-02 LAB — HM DIABETES EYE EXAM

## 2014-04-07 ENCOUNTER — Encounter: Payer: Self-pay | Admitting: *Deleted

## 2014-04-13 ENCOUNTER — Encounter: Payer: Self-pay | Admitting: Internal Medicine

## 2014-04-13 ENCOUNTER — Ambulatory Visit (INDEPENDENT_AMBULATORY_CARE_PROVIDER_SITE_OTHER): Payer: Commercial Managed Care - HMO | Admitting: Internal Medicine

## 2014-04-13 ENCOUNTER — Telehealth: Payer: Self-pay | Admitting: Internal Medicine

## 2014-04-13 VITALS — BP 128/70 | HR 77 | Temp 98.2°F | Ht 64.0 in | Wt 138.0 lb

## 2014-04-13 DIAGNOSIS — E785 Hyperlipidemia, unspecified: Secondary | ICD-10-CM

## 2014-04-13 DIAGNOSIS — Z1239 Encounter for other screening for malignant neoplasm of breast: Secondary | ICD-10-CM

## 2014-04-13 DIAGNOSIS — IMO0002 Reserved for concepts with insufficient information to code with codable children: Secondary | ICD-10-CM

## 2014-04-13 DIAGNOSIS — F329 Major depressive disorder, single episode, unspecified: Secondary | ICD-10-CM

## 2014-04-13 DIAGNOSIS — F32A Depression, unspecified: Secondary | ICD-10-CM

## 2014-04-13 DIAGNOSIS — E1065 Type 1 diabetes mellitus with hyperglycemia: Secondary | ICD-10-CM

## 2014-04-13 DIAGNOSIS — Z23 Encounter for immunization: Secondary | ICD-10-CM

## 2014-04-13 DIAGNOSIS — Z0000 Encounter for general adult medical examination without abnormal findings: Secondary | ICD-10-CM

## 2014-04-13 MED ORDER — TRAZODONE HCL 100 MG PO TABS: 100.0000 mg | ORAL_TABLET | Freq: Every day | ORAL | Status: DC

## 2014-04-13 MED ORDER — HYDROXYZINE PAMOATE 25 MG PO CAPS: 25.0000 mg | ORAL_CAPSULE | Freq: Three times a day (TID) | ORAL | Status: DC | PRN

## 2014-04-13 NOTE — Progress Notes (Signed)
Pre visit review using our clinic review tool, if applicable. No additional management support is needed unless otherwise documented below in the visit note. 

## 2014-04-13 NOTE — Assessment & Plan Note (Signed)
Encouraged to follow up with Dr. Reece Levy (psyc) for same q4-62mo -  No longer taking venlafaxine or traz due to cost of behav health OV and apaty emotion on  SNRI symptoms generally stable except for insomnia - resume traz now

## 2014-04-13 NOTE — Assessment & Plan Note (Signed)
Follows with endo (kumar) for same - lantus bid and humalog ssi rx'd (rarely uses same)- Check a1c q 53mo as needed On statin and ARB  Lab Results  Component Value Date   HGBA1C 8.0* 11/25/2013

## 2014-04-13 NOTE — Assessment & Plan Note (Signed)
On statin - variable compliance reported due to myalgias Changed to lovastatin early 2015 Last lipids reviewed - at goal, check annually

## 2014-04-13 NOTE — Telephone Encounter (Signed)
emmi mailed  °

## 2014-04-13 NOTE — Patient Instructions (Addendum)
It was good to see you today.  We have reviewed your prior records including labs and tests today  Health Maintenance reviewed - Your annual flu shot was given and/or updated today.  - all other recommended immunizations and age-appropriate screenings are up-to-date.  Medications reviewed and updated Resume Traz and Vistaril for sleep - no other changes recommended at this time. Your prescription(s) have been submitted to your pharmacy. Please take as directed and contact our office if you believe you are having problem(s) with the medication(s).  we'll make referral for your mammogram. Our office will contact you regarding appointment(s) once made.  Please schedule followup in 6 months for semiannual exam and labs, call sooner if problems.  Health Maintenance Adopting a healthy lifestyle and getting preventive care can go a long way to promote health and wellness. Talk with your health care provider about what schedule of regular examinations is right for you. This is a good chance for you to check in with your provider about disease prevention and staying healthy. In between checkups, there are plenty of things you can do on your own. Experts have done a lot of research about which lifestyle changes and preventive measures are most likely to keep you healthy. Ask your health care provider for more information. WEIGHT AND DIET  Eat a healthy diet  Be sure to include plenty of vegetables, fruits, low-fat dairy products, and lean protein.  Do not eat a lot of foods high in solid fats, added sugars, or salt.  Get regular exercise. This is one of the most important things you can do for your health.  Most adults should exercise for at least 150 minutes each week. The exercise should increase your heart rate and make you sweat (moderate-intensity exercise).  Most adults should also do strengthening exercises at least twice a week. This is in addition to the moderate-intensity exercise.   Maintain a healthy weight  Body mass index (BMI) is a measurement that can be used to identify possible weight problems. It estimates body fat based on height and weight. Your health care provider can help determine your BMI and help you achieve or maintain a healthy weight.  For females 59 years of age and older:   A BMI below 18.5 is considered underweight.  A BMI of 18.5 to 24.9 is normal.  A BMI of 25 to 29.9 is considered overweight.  A BMI of 30 and above is considered obese.  Watch levels of cholesterol and blood lipids  You should start having your blood tested for lipids and cholesterol at 60 years of age, then have this test every 5 years.  You may need to have your cholesterol levels checked more often if:  Your lipid or cholesterol levels are high.  You are older than 60 years of age.  You are at high risk for heart disease.  CANCER SCREENING   Lung Cancer  Lung cancer screening is recommended for adults 36-53 years old who are at high risk for lung cancer because of a history of smoking.  A yearly low-dose CT scan of the lungs is recommended for people who:  Currently smoke.  Have quit within the past 15 years.  Have at least a 30-pack-year history of smoking. A pack year is smoking an average of one pack of cigarettes a day for 1 year.  Yearly screening should continue until it has been 15 years since you quit.  Yearly screening should stop if you develop a health problem that  would prevent you from having lung cancer treatment.  Breast Cancer  Practice breast self-awareness. This means understanding how your breasts normally appear and feel.  It also means doing regular breast self-exams. Let your health care provider know about any changes, no matter how small.  If you are in your 20s or 30s, you should have a clinical breast exam (CBE) by a health care provider every 1-3 years as part of a regular health exam.  If you are 37 or older, have a  CBE every year. Also consider having a breast X-ray (mammogram) every year.  If you have a family history of breast cancer, talk to your health care provider about genetic screening.  If you are at high risk for breast cancer, talk to your health care provider about having an MRI and a mammogram every year.  Breast cancer gene (BRCA) assessment is recommended for women who have family members with BRCA-related cancers. BRCA-related cancers include:  Breast.  Ovarian.  Tubal.  Peritoneal cancers.  Results of the assessment will determine the need for genetic counseling and BRCA1 and BRCA2 testing. Cervical Cancer Routine pelvic examinations to screen for cervical cancer are no longer recommended for nonpregnant women who are considered low risk for cancer of the pelvic organs (ovaries, uterus, and vagina) and who do not have symptoms. A pelvic examination may be necessary if you have symptoms including those associated with pelvic infections. Ask your health care provider if a screening pelvic exam is right for you.   The Pap test is the screening test for cervical cancer for women who are considered at risk.  If you had a hysterectomy for a problem that was not cancer or a condition that could lead to cancer, then you no longer need Pap tests.  If you are older than 65 years, and you have had normal Pap tests for the past 10 years, you no longer need to have Pap tests.  If you have had past treatment for cervical cancer or a condition that could lead to cancer, you need Pap tests and screening for cancer for at least 20 years after your treatment.  If you no longer get a Pap test, assess your risk factors if they change (such as having a new sexual partner). This can affect whether you should start being screened again.  Some women have medical problems that increase their chance of getting cervical cancer. If this is the case for you, your health care provider may recommend more  frequent screening and Pap tests.  The human papillomavirus (HPV) test is another test that may be used for cervical cancer screening. The HPV test looks for the virus that can cause cell changes in the cervix. The cells collected during the Pap test can be tested for HPV.  The HPV test can be used to screen women 84 years of age and older. Getting tested for HPV can extend the interval between normal Pap tests from three to five years.  An HPV test also should be used to screen women of any age who have unclear Pap test results.  After 60 years of age, women should have HPV testing as often as Pap tests.  Colorectal Cancer  This type of cancer can be detected and often prevented.  Routine colorectal cancer screening usually begins at 60 years of age and continues through 60 years of age.  Your health care provider may recommend screening at an earlier age if you have risk factors for colon cancer.  Your health care provider may also recommend using home test kits to check for hidden blood in the stool.  A small camera at the end of a tube can be used to examine your colon directly (sigmoidoscopy or colonoscopy). This is done to check for the earliest forms of colorectal cancer.  Routine screening usually begins at age 77.  Direct examination of the colon should be repeated every 5-10 years through 60 years of age. However, you may need to be screened more often if early forms of precancerous polyps or small growths are found. Skin Cancer  Check your skin from head to toe regularly.  Tell your health care provider about any new moles or changes in moles, especially if there is a change in a mole's shape or color.  Also tell your health care provider if you have a mole that is larger than the size of a pencil eraser.  Always use sunscreen. Apply sunscreen liberally and repeatedly throughout the day.  Protect yourself by wearing long sleeves, pants, a wide-brimmed hat, and sunglasses  whenever you are outside. HEART DISEASE, DIABETES, AND HIGH BLOOD PRESSURE   Have your blood pressure checked at least every 1-2 years. High blood pressure causes heart disease and increases the risk of stroke.  If you are between 18 years and 62 years old, ask your health care provider if you should take aspirin to prevent strokes.  Have regular diabetes screenings. This involves taking a blood sample to check your fasting blood sugar level.  If you are at a normal weight and have a low risk for diabetes, have this test once every three years after 60 years of age.  If you are overweight and have a high risk for diabetes, consider being tested at a younger age or more often. PREVENTING INFECTION  Hepatitis B  If you have a higher risk for hepatitis B, you should be screened for this virus. You are considered at high risk for hepatitis B if:  You were born in a country where hepatitis B is common. Ask your health care provider which countries are considered high risk.  Your parents were born in a high-risk country, and you have not been immunized against hepatitis B (hepatitis B vaccine).  You have HIV or AIDS.  You use needles to inject street drugs.  You live with someone who has hepatitis B.  You have had sex with someone who has hepatitis B.  You get hemodialysis treatment.  You take certain medicines for conditions, including cancer, organ transplantation, and autoimmune conditions. Hepatitis C  Blood testing is recommended for:  Everyone born from [birth year] through 1967.  Anyone with known risk factors for hepatitis C. Sexually transmitted infections (STIs)  You should be screened for sexually transmitted infections (STIs) including gonorrhea and chlamydia if:  You are sexually active and are younger than 60 years of age.  You are older than 59 years of age and your health care provider tells you that you are at risk for this type of infection.  Your sexual activity  has changed since you were last screened and you are at an increased risk for chlamydia or gonorrhea. Ask your health care provider if you are at risk.  If you do not have HIV, but are at risk, it may be recommended that you take a prescription medicine daily to prevent HIV infection. This is called pre-exposure prophylaxis (PrEP). You are considered at risk if:  You are sexually active and do not regularly use  condoms or know the HIV status of your partner(s).  You take drugs by injection.  You are sexually active with a partner who has HIV. Talk with your health care provider about whether you are at high risk of being infected with HIV. If you choose to begin PrEP, you should first be tested for HIV. You should then be tested every 3 months for as long as you are taking PrEP.  PREGNANCY   If you are premenopausal and you may become pregnant, ask your health care provider about preconception counseling.  If you may become pregnant, take 400 to 800 micrograms (mcg) of folic acid every day.  If you want to prevent pregnancy, talk to your health care provider about birth control (contraception). OSTEOPOROSIS AND MENOPAUSE   Osteoporosis is a disease in which the bones lose minerals and strength with aging. This can result in serious bone fractures. Your risk for osteoporosis can be identified using a bone density scan.  If you are 7 years of age or older, or if you are at risk for osteoporosis and fractures, ask your health care provider if you should be screened.  Ask your health care provider whether you should take a calcium or vitamin D supplement to lower your risk for osteoporosis.  Menopause may have certain physical symptoms and risks.  Hormone replacement therapy may reduce some of these symptoms and risks. Talk to your health care provider about whether hormone replacement therapy is right for you.  HOME CARE INSTRUCTIONS   Schedule regular health, dental, and eye  exams.  Stay current with your immunizations.   Do not use any tobacco products including cigarettes, chewing tobacco, or electronic cigarettes.  If you are pregnant, do not drink alcohol.  If you are breastfeeding, limit how much and how often you drink alcohol.  Limit alcohol intake to no more than 1 drink per day for nonpregnant women. One drink equals 12 ounces of beer, 5 ounces of wine, or 1 ounces of hard liquor.  Do not use street drugs.  Do not share needles.  Ask your health care provider for help if you need support or information about quitting drugs.  Tell your health care provider if you often feel depressed.  Tell your health care provider if you have ever been abused or do not feel safe at home. Document Released: 01/02/2011 Document Revised: 11/03/2013 Document Reviewed: 05/21/2013 [facility] Patient Information 2015 [place], [place]. This information is not intended to replace advice given to you by your health care provider. Make sure you discuss any questions you have with your health care provider.

## 2014-04-13 NOTE — Progress Notes (Signed)
Subjective:    Patient ID: Leslie Lane, female    DOB: 03-25-1956, 58 y.o.   MRN: [id number]  HPI   Here for medicare wellness  Diet: heart healthy, diabetic Physical activity: sedentary Depression/mood screen: negative Hearing: intact to whispered voice Visual acuity: grossly normal, performs annual eye exam  ADLs: capable Fall risk: none Home safety: good Cognitive evaluation: intact to orientation, naming, recall and repetition EOL planning: adv directives, full code/ I agree  I have personally reviewed and have noted 1. The patient's medical and social history 2. Their use of alcohol, tobacco or illicit drugs 3. Their current medications and supplements 4. The patient's functional ability including ADL's, fall risks, home safety risks and hearing or visual impairment. 5. Diet and physical activities 6. Evidence for depression or mood disorders  Also reviewed chronic medical issues and interval medical events  Past Medical History  Diagnosis Date  . HEPATITIS B   . BIPOLAR AFFECTIVE DISORDER   . SMOKER   . MIGRAINE HEADACHE   . NECK PAIN, CHRONIC   . LOW BACK PAIN, CHRONIC   . ALLERGIC RHINITIS   . HYPERLIPIDEMIA   . GERD   . DIABETES MELLITUS, TYPE II     follows with endo  . DEPRESSION   . NARCOTIC ABUSE     hx of  . Anxiety   . Arthritis   . Fibromyalgia    Family History  Problem Relation Age of Onset  . Arthritis Mother   . Arthritis Father   . Kidney disease Father   . Kidney cancer Father   . Hyperlipidemia Other   . Kidney cancer Paternal Aunt   . Heart attack Brother   . Bladder Cancer Sister    History  Substance Use Topics  . Smoking status: Current Every Day Smoker -- 40 years    Types: Cigarettes  . Smokeless tobacco: Never Used     Comment: divorced, lives with her parents but has boyfriend-disabled  . Alcohol Use: Yes     Comment: occasionally    Review of Systems  Constitutional: Positive for fatigue. Negative for fever  and unexpected weight change.  Respiratory: Negative for cough, shortness of breath and wheezing.   Cardiovascular: Negative for chest pain, palpitations and leg swelling.  Gastrointestinal: Negative for nausea, abdominal pain and diarrhea.  Musculoskeletal: Positive for arthralgias and myalgias. Negative for gait problem and joint swelling.  Skin: Negative for wound.  Neurological: Negative for dizziness, weakness, light-headedness and headaches.  Psychiatric/Behavioral: Negative for dysphoric mood. The patient is not nervous/anxious.   All other systems reviewed and are negative.      Objective:   Physical Exam  BP 128/70  Pulse 77  Temp(Src) 98.2 F (36.8 C) (Oral)  Ht 5\' 4"  (1.626 m)  Wt 138 lb (62.596 kg)  BMI 23.68 kg/m2  SpO2 97% Wt Readings from Last 3 Encounters:  04/13/14 138 lb (62.596 kg)  01/28/14 138 lb 6.4 oz (62.778 kg)  11/28/13 141 lb (63.957 kg)   Constitutional: She appears well-developed and well-nourished. No distress.  HENT: Head: Normocephalic and atraumatic. Ears: B TMs ok, no erythema or effusion; Nose: Nose normal. Mouth/Throat: Oropharynx is clear and moist. No oropharyngeal exudate.  Eyes: Conjunctivae and EOM are normal. Pupils are equal, round, and reactive to light. No scleral icterus.  Neck: Normal range of motion. Neck supple. No JVD present. No thyromegaly present.  Cardiovascular: Normal rate, regular rhythm and normal heart sounds.  No murmur heard. No BLE edema.  Pulmonary/Chest: Effort normal and breath sounds normal. No respiratory distress. She has no wheezes.  Abdominal: Soft. Bowel sounds are normal. She exhibits no distension. There is no tenderness. no masses GU/breast: defer to gyn Musculoskeletal: Normal range of motion, no joint effusions. No gross deformities Neurological: She is alert and oriented to person, place, and time. No cranial nerve deficit. Coordination, balance, strength, speech and gait are normal.  Skin: Skin is warm  and dry. No rash noted. No erythema.  Psychiatric: She has a normal mood and affect. Her behavior is normal. Judgment and thought content normal.    Lab Results  Component Value Date   WBC 7.2 07/31/2013   HGB 12.1 07/31/2013   HCT 38.2 07/31/2013   PLT 306.0 07/31/2013   GLUCOSE 180* 11/25/2013   CHOL 185 11/25/2013   TRIG 41.0 11/25/2013   HDL 80.50 11/25/2013   LDLDIRECT 101.7 03/26/2012   LDLCALC 96 11/25/2013   ALT 17 11/25/2013   AST 19 11/25/2013   NA 137 11/25/2013   K 4.1 11/25/2013   CL 100 11/25/2013   CREATININE 0.8 11/25/2013   BUN 11 11/25/2013   CO2 31 11/25/2013   TSH 1.23 09/10/2013   HGBA1C 8.0* 11/25/2013   MICROALBUR 0.2 09/10/2013    Ct Abdomen Pelvis W Contrast  07/28/2013   CLINICAL DATA:  Chronic abdominal pain.  EXAM: CT ABDOMEN AND PELVIS WITH CONTRAST  TECHNIQUE: Multidetector CT imaging of the abdomen and pelvis was performed using the standard protocol following bolus administration of intravenous contrast.  CONTRAST:  127mL OMNIPAQUE IOHEXOL 300 MG/ML  SOLN  COMPARISON:  02/09/2006  FINDINGS: The liver, gallbladder, spleen, pancreas, adrenal glands, and kidneys are normal in appearance. No evidence hydronephrosis.  No soft tissue masses or lymphadenopathy identified within the abdomen or pelvis. Uterus and adnexal regions are unremarkable in appearance.  No evidence of inflammatory process or abnormal fluid collections. Normal appendix is visualized. No evidence of bowel wall thickening or dilatation.  Mild diffuse wall thickening of the urinary bladder is noted. While this may be due to incomplete distention of the bladder, cystitis cannot be excluded.  IMPRESSION: Mild diffuse wall thickening of urinary bladder noted. While this may be due to incomplete bladder distention, cystitis cannot be excluded. Recommend correlation with urinalysis.  No other acute findings or significant abnormality identified.   Electronically Signed   By: Earle Gell M.D.   On: 07/28/2013 12:12        Assessment & Plan:   AWV/CPX/z00.00 - Today patient counseled on age appropriate routine health concerns for screening and prevention, each reviewed and up to date or declined. Immunizations reviewed and up to date or declined. Labs ordered and reviewed. Risk factors for depression reviewed and negative. Hearing function and visual acuity are intact. ADLs screened and addressed as needed. Functional ability and level of safety reviewed and appropriate. Education, counseling and referrals performed based on assessed risks today. Patient provided with a copy of personalized plan for preventive services.  Problem List Items Addressed This Visit   Depression     Encouraged to follow up with Dr. Reece Levy (psyc) for same q4-24mo -  No longer taking venlafaxine or traz due to cost of behav health OV and apaty emotion on  SNRI symptoms generally stable except for insomnia - resume traz now    Relevant Medications      traZODone (DESYREL) tablet      hydrOXYzine ([facility] ONLY - VISTARIL) capsule   Hyperlipidemia     On  statin - variable compliance reported due to myalgias Changed to lovastatin early 2015 Last lipids reviewed - at goal, check annually    Uncontrolled type 1 diabetes mellitus      Follows with endo (kumar) for same - lantus bid and humalog ssi rx'd (rarely uses same)- Check a1c q 14mo as needed On statin and ARB  Lab Results  Component Value Date   HGBA1C 8.0* 11/25/2013       Other Visit Diagnoses   Routine general medical examination at a health care facility    -  Primary    Need for prophylactic vaccination and inoculation against influenza        Relevant Orders       Flu Vaccine QUAD 36+ mos PF IM (Fluarix Quad PF) (Completed)    Encounter for breast cancer screening other than mammogram        Relevant Orders       MM DIGITAL SCREENING BILATERAL

## 2014-04-24 ENCOUNTER — Telehealth: Payer: Self-pay

## 2014-04-24 NOTE — Telephone Encounter (Signed)
Called pt and schedule acute visit for [place] to review podiatry referral.

## 2014-04-24 NOTE — Telephone Encounter (Signed)
Spoke to pt about what I thought was an ad. Pt confirmed that the fax for rx was spam and not to send.   Pt stated during the call stated that she would a referral for the following:  Podiatrist for calluses Psychologist for depression

## 2014-04-24 NOTE — Telephone Encounter (Signed)
She may need a visit to address these things but she should be able to seek mental health care without a referral. Perhaps Dr. Asa Lente would place when she returns for podiatry.

## 2014-04-27 ENCOUNTER — Other Ambulatory Visit (INDEPENDENT_AMBULATORY_CARE_PROVIDER_SITE_OTHER): Payer: Commercial Managed Care - HMO

## 2014-04-27 ENCOUNTER — Encounter: Payer: Self-pay | Admitting: Family

## 2014-04-27 ENCOUNTER — Ambulatory Visit (INDEPENDENT_AMBULATORY_CARE_PROVIDER_SITE_OTHER): Payer: Commercial Managed Care - HMO | Admitting: Family

## 2014-04-27 VITALS — BP 128/70 | HR 69 | Temp 97.9°F | Resp 18

## 2014-04-27 DIAGNOSIS — E1065 Type 1 diabetes mellitus with hyperglycemia: Secondary | ICD-10-CM

## 2014-04-27 DIAGNOSIS — F411 Generalized anxiety disorder: Secondary | ICD-10-CM | POA: Insufficient documentation

## 2014-04-27 DIAGNOSIS — L84 Corns and callosities: Secondary | ICD-10-CM

## 2014-04-27 DIAGNOSIS — IMO0002 Reserved for concepts with insufficient information to code with codable children: Secondary | ICD-10-CM

## 2014-04-27 DIAGNOSIS — E049 Nontoxic goiter, unspecified: Secondary | ICD-10-CM

## 2014-04-27 HISTORY — DX: Generalized anxiety disorder: F41.1

## 2014-04-27 LAB — COMPREHENSIVE METABOLIC PANEL
ALK PHOS: 55 U/L (ref 39–117)
ALT: 14 U/L (ref 0–35)
AST: 21 U/L (ref 0–37)
Albumin: 2.9 g/dL — ABNORMAL LOW (ref 3.5–5.2)
BUN: 11 mg/dL (ref 6–23)
CALCIUM: 8.9 mg/dL (ref 8.4–10.5)
CHLORIDE: 103 meq/L (ref 96–112)
CO2: 28 mEq/L (ref 19–32)
CREATININE: 0.8 mg/dL (ref 0.4–1.2)
GFR: 75.58 mL/min (ref >60.00)
Glucose, Bld: 135 mg/dL — ABNORMAL HIGH (ref 70–99)
Potassium: 4.2 mEq/L (ref 3.5–5.1)
Sodium: 139 mEq/L (ref 135–145)
Total Bilirubin: 0.4 mg/dL (ref 0.2–1.2)
Total Protein: 6.5 g/dL (ref 6.0–8.3)

## 2014-04-27 LAB — MICROALBUMIN / CREATININE URINE RATIO
CREATININE, U: 236.7 mg/dL
Microalb Creat Ratio: 0.3 mg/g (ref 0.0–30.0)
Microalb, Ur: 0.7 mg/dL (ref 0.0–1.9)

## 2014-04-27 LAB — URINALYSIS, ROUTINE W REFLEX MICROSCOPIC
Bilirubin Urine: NEGATIVE
Hgb urine dipstick: NEGATIVE
KETONES UR: NEGATIVE
LEUKOCYTES UA: NEGATIVE
NITRITE: NEGATIVE
PH: 6 (ref 5.0–8.0)
RBC / HPF: NONE SEEN (ref 0–?)
SPECIFIC GRAVITY, URINE: 1.02 (ref 1.000–1.030)
Total Protein, Urine: NEGATIVE
UROBILINOGEN UA: 0.2 (ref 0.0–1.0)
Urine Glucose: NEGATIVE

## 2014-04-27 LAB — HEMOGLOBIN A1C: Hgb A1c MFr Bld: 8 % — ABNORMAL HIGH (ref 4.6–6.5)

## 2014-04-27 LAB — TSH: TSH: 0.78 u[IU]/mL (ref 0.35–4.50)

## 2014-04-27 MED ORDER — BUSPIRONE HCL 7.5 MG PO TABS: 7.5000 mg | ORAL_TABLET | Freq: Three times a day (TID) | ORAL | Status: DC

## 2014-04-27 NOTE — Progress Notes (Signed)
Subjective:    Patient ID: Leslie Lane, female    DOB: Jan 12, 1956, 58 y.o.   MRN: [id number]  Chief Complaint  Patient presents with  . Foot Pain    Has calluses on both feet that make it hurt for her to walk    HPI:  Leslie Lane is a 58 y.o. female who presents today for an office visit.   Indicates that has had calluses on her the bottom of her 2nd-3rd toes on the left and 3rd-5th toes on the right. These calluses have been going since early 20's. Over this course her calluses have remained the same with no significant improvements. She has tried to use a pummus stone and shave the calluses, however they leave her feet hurting. Has also had a significant history for hyperhyrdosis of her feet that she is treating with baby powder. There is nothing that makes her feet better or worse. She is a Type I diabetic and would like a referral to a podiatrist.   2) Anxiety - indicates this has been going on for a while and has tried multiple medications in a variety of classes. Would like to try something new for anxiety if it can be recommended.   Allergies  Allergen Reactions  . Penicillins   . Sulfonamide Derivatives    Current Outpatient Prescriptions on File Prior to Visit  Medication Sig Dispense Refill  . aspirin 81 MG tablet Take 81 mg by mouth daily.      Marland Kitchen esomeprazole (NEXIUM) 40 MG capsule Take 1 capsule (40 mg total) by mouth daily before breakfast.  90 capsule  1  . glucagon (GLUCAGON EMERGENCY) 1 MG injection Inject 1 mg into the skin once as needed.  2 kit  11  . glycopyrrolate (ROBINUL) 1 MG tablet Take 1 tablet (1 mg total) by mouth 2 (two) times daily.  60 tablet  11  . hydrOXYzine (VISTARIL) 25 MG capsule Take 1 capsule (25 mg total) by mouth 3 (three) times daily as needed.  90 capsule  3  . ibuprofen (ADVIL,MOTRIN) 600 MG tablet Take 1 tablet (600 mg total) by mouth every 6 (six) hours as needed for pain.  30 tablet  0  . insulin lispro (HUMALOG) 100 UNIT/ML  injection Sliding scale 10 units in am and 8 units at night  30 mL  1  . LANTUS 100 UNIT/ML injection INJECT 9 UNITS INTO THE SKIN IN THE MORNING AS INSTRUCTED (DISCARD VIAL 28 DAYS AFTER OPENING)  30 mL  1  . loratadine (CLARITIN) 10 MG tablet Take 10 mg by mouth as needed for allergies.      Marland Kitchen losartan (COZAAR) 25 MG tablet TAKE 1 TABLET BY MOUTH DAILY  30 tablet  5  . lovastatin (MEVACOR) 40 MG tablet Take 1 tablet (40 mg total) by mouth at bedtime.  90 tablet  3  . Multiple Vitamin (MULTIVITAMIN) tablet Take 1 tablet by mouth daily.        . ONE TOUCH ULTRA TEST test strip USE  TO CHECK BLOOD SUGAR FOUR TIMES DAILY  400 each  1  . traMADol (ULTRAM) 50 MG tablet Take 2 tablets (100 mg total) by mouth every 12 (twelve) hours as needed for moderate pain or severe pain.  60 tablet  5  . traZODone (DESYREL) 100 MG tablet Take 1-2 tablets (100-200 mg total) by mouth at bedtime.  60 tablet  11  . vitamin A 10000 UNIT capsule Take 10,000 Units by mouth daily.      Marland Kitchen  vitamin B-12 (CYANOCOBALAMIN) 1000 MCG tablet Take 1,000 mcg by mouth daily.        . vitamin E (VITAMIN E) 1000 UNIT capsule Take 1,000 Units by mouth daily.         No current facility-administered medications on file prior to visit.    Review of Systems    See HPI  Objective:    BP 128/70  Pulse 69  Temp(Src) 97.9 F (36.6 C) (Oral)  Resp 18 Nursing note and vital signs reviewed.  Physical Exam  Constitutional: She is oriented to person, place, and time. She appears well-developed and well-nourished. No distress.  Cardiovascular: Normal rate, regular rhythm and normal heart sounds.   Pulmonary/Chest: Effort normal and breath sounds normal.  Neurological: She is alert and oriented to person, place, and time.  Skin: Skin is warm and dry.  Callus formation on the medial aspect of her 2-3 toes on the left and 2-5 toes on the right. Pulses and sensation are intact bilaterally. No evidence of any cuts or scrapes.     Psychiatric: She has a normal mood and affect. Her behavior is normal. Judgment and thought content normal.       Assessment & Plan:

## 2014-04-27 NOTE — Patient Instructions (Addendum)
Thank you for choosing [facility].  Summary/Instructions:   Continue to use the powder as necessary, try an antiperspirant stick to help reduce the perspiration.   Continue to check your feet on a regular basis - a referral has been sent for a podiatrist foot exam and care.   Your prescription has been sent to your pharmacy. Please plan to follow up in about 3 weeks.

## 2014-04-27 NOTE — Assessment & Plan Note (Addendum)
Refer to podiatry for diabetic foot exam.

## 2014-04-27 NOTE — Assessment & Plan Note (Addendum)
Long extensive list of previously attempted medications. Discussed potential options. Start Buspar. Follow up in 3 weeks to determine effect.

## 2014-04-27 NOTE — Progress Notes (Signed)
Pre visit review using our clinic review tool, if applicable. No additional management support is needed unless otherwise documented below in the visit note. 

## 2014-04-27 NOTE — Assessment & Plan Note (Signed)
Appears consistent with callus from friction. Given history of Type I Diabetes, patient is overdue for diabetic foot exam with podiatry. Will refer to podiatry for management. Discussed potential use of antiperspirant on the bottom of her feet help with moisture control. Follow up if symptoms worsen or fail to improve.

## 2014-04-30 ENCOUNTER — Ambulatory Visit (INDEPENDENT_AMBULATORY_CARE_PROVIDER_SITE_OTHER): Payer: Commercial Managed Care - HMO | Admitting: Endocrinology

## 2014-04-30 ENCOUNTER — Encounter: Payer: Self-pay | Admitting: Endocrinology

## 2014-04-30 VITALS — BP 117/60 | HR 75 | Temp 97.8°F | Resp 14 | Ht 64.0 in | Wt 144.0 lb

## 2014-04-30 DIAGNOSIS — E1065 Type 1 diabetes mellitus with hyperglycemia: Secondary | ICD-10-CM

## 2014-04-30 DIAGNOSIS — IMO0002 Reserved for concepts with insufficient information to code with codable children: Secondary | ICD-10-CM

## 2014-04-30 NOTE — Patient Instructions (Addendum)
Lantus 9 units a.m., 7 units night  Take 4-6 oz juice for low sugars  No Humalog at bedtime  Get snack or juice with housework like vacuuming

## 2014-04-30 NOTE — Progress Notes (Signed)
Patient ID: Leslie Lane, female   DOB: August 28, 1955, 58 y.o.   MRN: [id number]   Reason for Appointment: Diabetes follow-up   History of Present Illness   Diagnosis: Type 1 DIABETES MELITUS, diagnosed 1967     Insulin regimen: Lantus 10 units a.m., 8 units h.s. Humalog 1-2 p.r.n. With vial  She has had labile blood sugar control over the last several years even though A1c has been usually around 7% She has had less lability and hypoglycemia with taking b.i.d. Lantus compared once a day She has been very sensitive to fast acting insulin and frequently does not require mealtime coverage  RECENT history: Her blood sugars are again getting as low more frequently and has about 21% of her blood sugar is below target  Most of her low sugars appear to be early morning and late at night  She does not adjust her Lantus even if she is having glucose readings as low as 22 Also today her glucose was 41 and she treated this only with a smaller tangerine  Recently has not had any severe hypoglycemia She does not have as many extremely high readings and only 37% above target Surprisingly her A1c has not improved Problems and blood sugar patterns identified:   She has not taken much Humalog for her high readings says most of the readings recently have not been high.  However she does not recall whether she takes 1 or 2 units when the blood sugars are in the 200+ range, has no readings over 300  She thinks that eating a bag of popcorn would make her blood sugar go up significantly high but she will not take any Humalog at that time  She may get more low blood sugars after doing a lot of housework; does not usually take extra snacks when she is more active  Has sporadic high readings in the mornings, midafternoon and late evening  The median reading in the morning is only 46 although she has only one low sugar in the last week in the morning  HYPOGLYCEMIA: This is not as frequent as her last  visit, most often late in the evening a couple of hours after her Lantus Her mother knows how to give Glucagon injection She will take her low blood sugars usually with juice, does not like glucose tablets      DIET: She is usually not eating balanced meals and is eating high-fat snacks like chips and popcorn at various times.  Usually trying to avoid high-fat meals. She will eat more when she is anxious.  Monitors blood glucose: 5.1 times a day.    Glucometer: One Touch.      Blood Glucose readings from meter download:   PREMEAL Breakfast Lunch Dinner Bedtime Overall  Glucose range:  18-225   65-216   26-200   31-277    Median:  46   93   130   133   111      Lab Results  Component Value Date   HGBA1C 8.0* 04/27/2014   HGBA1C 8.0* 11/25/2013   HGBA1C 8.3* 09/10/2013   Lab Results  Component Value Date   MICROALBUR 0.7 04/27/2014   LDLCALC 96 11/25/2013   CREATININE 0.8 04/27/2014    Appointment on 04/27/2014  Component Date Value Ref Range Status  . Hemoglobin A1C 04/27/2014 8.0* 4.6 - 6.5 % Final   Glycemic Control Guidelines for People with Diabetes:Non Diabetic:  <6%Goal of Therapy: <7%Additional Action Suggested:  >8%   .  Sodium 04/27/2014 139  135 - 145 mEq/L Final  . Potassium 04/27/2014 4.2  3.5 - 5.1 mEq/L Final  . Chloride 04/27/2014 103  96 - 112 mEq/L Final  . CO2 04/27/2014 28  19 - 32 mEq/L Final  . Glucose, Bld 04/27/2014 135* 70 - 99 mg/dL Final  . BUN 04/27/2014 11  6 - 23 mg/dL Final  . Creatinine, Ser 04/27/2014 0.8  0.4 - 1.2 mg/dL Final  . Total Bilirubin 04/27/2014 0.4  0.2 - 1.2 mg/dL Final  . Alkaline Phosphatase 04/27/2014 55  39 - 117 U/L Final  . AST 04/27/2014 21  0 - 37 U/L Final  . ALT 04/27/2014 14  0 - 35 U/L Final  . Total Protein 04/27/2014 6.5  6.0 - 8.3 g/dL Final  . Albumin 04/27/2014 2.9* 3.5 - 5.2 g/dL Final  . Calcium 04/27/2014 8.9  8.4 - 10.5 mg/dL Final  . GFR 04/27/2014 75.58  >60.00 mL/min Final  . Microalb, Ur 04/27/2014 0.7   0.0 - 1.9 mg/dL Final  . Creatinine,U 04/27/2014 236.7   Final  . Microalb Creat Ratio 04/27/2014 0.3  0.0 - 30.0 mg/g Final  . Color, Urine 04/27/2014 YELLOW  Yellow;Lt. Yellow Final  . APPearance 04/27/2014 CLEAR  Clear Final  . Specific Gravity, Urine 04/27/2014 1.020  1.000-1.030 Final  . pH 04/27/2014 6.0  5.0 - 8.0 Final  . Total Protein, Urine 04/27/2014 NEGATIVE  Negative Final  . Urine Glucose 04/27/2014 NEGATIVE  Negative Final  . Ketones, ur 04/27/2014 NEGATIVE  Negative Final  . Bilirubin Urine 04/27/2014 NEGATIVE  Negative Final  . Hgb urine dipstick 04/27/2014 NEGATIVE  Negative Final  . Urobilinogen, UA 04/27/2014 0.2  0.0 - 1.0 Final  . Leukocytes, UA 04/27/2014 NEGATIVE  Negative Final  . Nitrite 04/27/2014 NEGATIVE  Negative Final  . WBC, UA 04/27/2014 0-2/hpf  0-2/hpf Final  . RBC / HPF 04/27/2014 none seen  0-2/hpf Final  . Mucus, UA 04/27/2014 Presence of* None Final  . Squamous Epithelial / LPF 04/27/2014 Rare(0-4/hpf)  Rare(0-4/hpf) Final  . TSH 04/27/2014 0.78  0.35 - 4.50 uIU/mL Final        Medication List       This list is accurate as of: 04/30/14  1:14 PM.  Always use your most recent med list.               aspirin 81 MG tablet  Take 81 mg by mouth daily.     busPIRone 7.5 MG tablet  Commonly known as:  BUSPAR  Take 1 tablet (7.5 mg total) by mouth 3 (three) times daily. Start with 1 pill twice per day and then then 1 pill 3 times per day.     esomeprazole 40 MG capsule  Commonly known as:  NEXIUM  Take 1 capsule (40 mg total) by mouth daily before breakfast.     glucagon 1 MG injection  Commonly known as:  GLUCAGON EMERGENCY  Inject 1 mg into the skin once as needed.     glycopyrrolate 1 MG tablet  Commonly known as:  ROBINUL  Take 1 tablet (1 mg total) by mouth 2 (two) times daily.     hydrOXYzine 25 MG capsule  Commonly known as:  VISTARIL  Take 1 capsule (25 mg total) by mouth 3 (three) times daily as needed.     ibuprofen 600  MG tablet  Commonly known as:  ADVIL,MOTRIN  Take 1 tablet (600 mg total) by mouth every 6 (six) hours as needed  for pain.     insulin lispro 100 UNIT/ML injection  Commonly known as:  HUMALOG  Sliding scale 10 units in am and 8 units at night     LANTUS 100 UNIT/ML injection  Generic drug:  insulin glargine  INJECT 10 UNITS INTO THE SKIN IN THE MORNING AS INSTRUCTED (DISCARD VIAL 28 DAYS AFTER OPENING)     loratadine 10 MG tablet  Commonly known as:  CLARITIN  Take 10 mg by mouth as needed for allergies.     losartan 25 MG tablet  Commonly known as:  COZAAR  TAKE 1 TABLET BY MOUTH DAILY     lovastatin 40 MG tablet  Commonly known as:  MEVACOR  Take 1 tablet (40 mg total) by mouth at bedtime.     multivitamin tablet  Take 1 tablet by mouth daily.     ONE TOUCH ULTRA TEST test strip  Generic drug:  glucose blood  USE  TO CHECK BLOOD SUGAR FOUR TIMES DAILY     traMADol 50 MG tablet  Commonly known as:  ULTRAM  Take 2 tablets (100 mg total) by mouth every 12 (twelve) hours as needed for moderate pain or severe pain.     traZODone 100 MG tablet  Commonly known as:  DESYREL  Take 1-2 tablets (100-200 mg total) by mouth at bedtime.     vitamin A 10000 UNIT capsule  Take 10,000 Units by mouth daily.     vitamin B-12 1000 MCG tablet  Commonly known as:  CYANOCOBALAMIN  Take 1,000 mcg by mouth daily.     vitamin E 1000 UNIT capsule  Generic drug:  vitamin E  Take 1,000 Units by mouth daily.        Allergies:  Allergies  Allergen Reactions  . Penicillins   . Sulfonamide Derivatives     Past Medical History  Diagnosis Date  . HEPATITIS B   . BIPOLAR AFFECTIVE DISORDER   . SMOKER   . MIGRAINE HEADACHE   . NECK PAIN, CHRONIC   . LOW BACK PAIN, CHRONIC   . ALLERGIC RHINITIS   . HYPERLIPIDEMIA   . GERD   . DIABETES MELLITUS, TYPE II     follows with endo  . DEPRESSION   . NARCOTIC ABUSE     hx of  . Anxiety   . Arthritis   . Fibromyalgia     Past  Surgical History  Procedure Laterality Date  . Cesarean section      Family History  Problem Relation Age of Onset  . Arthritis Mother   . Arthritis Father   . Kidney disease Father   . Kidney cancer Father   . Hyperlipidemia Other   . Kidney cancer Paternal Aunt   . Heart attack Brother   . Bladder Cancer Sister     Social History:  reports that she has been smoking Cigarettes.  She has been smoking about 0.00 packs per day for the past 40 years. She has never used smokeless tobacco. She reports that she drinks alcohol. She reports that she does not use illicit drugs.  Review of Systems:  DEPRESSION: She has had long-standing depression and anxiety followed by the mental health clinic and she does not like going there. She is complaining about getting drowsiness from trazodone given to her recently  HYPERLIPIDEMIA: The lipid abnormality consists of elevated LDL previously treated  with simvastatin.  Now is on lovastatin 40 mg and does not complain of any unusual muscle aches Her LDL is  at target with the higher dose of lovastatin  Lab Results  Component Value Date   CHOL 185 11/25/2013   HDL 80.50 11/25/2013   LDLCALC 96 11/25/2013   LDLDIRECT 101.7 03/26/2012   TRIG 41.0 11/25/2013   CHOLHDL 2 11/25/2013     She has been treated with low-dose Cozaar by PCP, no history of hypertension or microalbuminuria  Has history of small goiter but no hypothyroidism     Examination:   BP 117/60  Pulse 75  Temp(Src) 97.8 F (36.6 C)  Resp 14  Ht 5\' 4"  (1.626 m)  Wt 144 lb (65.318 kg)  BMI 24.71 kg/m2  SpO2 97%  Body mass index is 24.71 kg/(m^2).     ASSESSMENT/ PLAN:   Diabetes type 1  The patient's diabetes control is overall is inadequate  Although her A1c is significantly high at 8% she is having much more hypoglycemia recently Most of her low sugars are fasting or bedtime although is reading as low as 26 in the afternoon She did not reduce her evening Lantus on the  last visit as directed See history of present illness for details on current blood sugar patterns, insulin regimen and problems identified  Because of hypoglycemia she is getting excessive amount of Lantus at this time She is very sensitive to small doses of Humalog and is not taking much at this time since overall recent readings are lower Have cautioned her against frequent hypoglycemia especially since she has minimal symptoms and can get significantly low readings early morning also Also discussed potential long-term effects on cognitive function with frequent hypoglycemia Discussed trying to avoid hypoglycemia with increased physical activity when she does a lot of housework Although she is a candidate for continuous glucose monitoring she will not get approval through insurance  She will reduce her Lantus by 1 unit twice a day at least and follow up in 6 weeks for review  Urine microalbumin indicates no nephropathy  Counseling time over 50% of today's 25 minute visit  Clydene Burack 04/30/2014, 1:14 PM   -

## 2014-05-04 ENCOUNTER — Encounter: Payer: Self-pay | Admitting: Endocrinology

## 2014-05-08 ENCOUNTER — Ambulatory Visit (INDEPENDENT_AMBULATORY_CARE_PROVIDER_SITE_OTHER): Payer: Commercial Managed Care - HMO | Admitting: Podiatry

## 2014-05-08 ENCOUNTER — Encounter: Payer: Self-pay | Admitting: Podiatry

## 2014-05-08 VITALS — BP 111/50 | HR 73 | Resp 13 | Ht 64.0 in | Wt 144.0 lb

## 2014-05-08 DIAGNOSIS — M79676 Pain in unspecified toe(s): Secondary | ICD-10-CM

## 2014-05-08 DIAGNOSIS — L84 Corns and callosities: Secondary | ICD-10-CM

## 2014-05-08 DIAGNOSIS — M205X9 Other deformities of toe(s) (acquired), unspecified foot: Secondary | ICD-10-CM

## 2014-05-08 NOTE — Patient Instructions (Signed)
Diabetes and Foot Care Diabetes may cause you to have problems because of poor blood supply (circulation) to your feet and legs. This may cause the skin on your feet to become thinner, break easier, and heal more slowly. Your skin may become dry, and the skin may peel and crack. You may also have nerve damage in your legs and feet causing decreased feeling in them. You may not notice minor injuries to your feet that could lead to infections or more serious problems. Taking care of your feet is one of the most important things you can do for yourself.  HOME CARE INSTRUCTIONS  Wear shoes at all times, even in the house. Do not go barefoot. Bare feet are easily injured.  Check your feet daily for blisters, cuts, and redness. If you cannot see the bottom of your feet, use a mirror or ask someone for help.  Wash your feet with warm water (do not use hot water) and mild soap. Then pat your feet and the areas between your toes until they are completely dry. Do not soak your feet as this can dry your skin.  Apply a moisturizing lotion or petroleum jelly (that does not contain alcohol and is unscented) to the skin on your feet and to dry, brittle toenails. Do not apply lotion between your toes.  Trim your toenails straight across. Do not dig under them or around the cuticle. File the edges of your nails with an emery board or nail file.  Do not cut corns or calluses or try to remove them with medicine.  Wear clean socks or stockings every day. Make sure they are not too tight. Do not wear knee-high stockings since they may decrease blood flow to your legs.  Wear shoes that fit properly and have enough cushioning. To break in new shoes, wear them for just a few hours a day. This prevents you from injuring your feet. Always look in your shoes before you put them on to be sure there are no objects inside.  Do not cross your legs. This may decrease the blood flow to your feet.  If you find a minor scrape,  cut, or break in the skin on your feet, keep it and the skin around it clean and dry. These areas may be cleansed with mild soap and water. Do not cleanse the area with peroxide, alcohol, or iodine.  When you remove an adhesive bandage, be sure not to damage the skin around it.  If you have a wound, look at it several times a day to make sure it is healing.  Do not use heating pads or hot water bottles. They may burn your skin. If you have lost feeling in your feet or legs, you may not know it is happening until it is too late.  Make sure your health care provider performs a complete foot exam at least annually or more often if you have foot problems. Report any cuts, sores, or bruises to your health care provider immediately. SEEK MEDICAL CARE IF:   You have an injury that is not healing.  You have cuts or breaks in the skin.  You have an ingrown nail.  You notice redness on your legs or feet.  You feel burning or tingling in your legs or feet.  You have pain or cramps in your legs and feet.  Your legs or feet are numb.  Your feet always feel cold. SEEK IMMEDIATE MEDICAL CARE IF:   There is increasing redness,   swelling, or pain in or around a wound.  There is a red line that goes up your leg.  Pus is coming from a wound.  You develop a fever or as directed by your health care provider.  You notice a bad smell coming from an ulcer or wound. Document Released: 06/16/2000 Document Revised: 02/19/2013 Document Reviewed: 11/26/2012 Exit[employer] Patient Information 2015 [employer]. This information is not intended to replace advice given to you by your health care provider. Make sure you discuss any questions you have with your health care provider.  

## 2014-05-08 NOTE — Progress Notes (Signed)
   Subjective:    Patient ID: Leslie Lane, female    DOB: April 02, 1956, 59 y.o.   MRN: [id number]  HPI Leslie Lane, 58 year old female, presents the office today with complaints of painful calluses to bilateral feet. She states that these areas become painful particularly with shoe gear, pressure, ambulation. She states that she periodically will trim the calluses herself when they become painful. She has diabetic, type I and she states that her blood sugar was 20 this morning however she ate afterwords and she was given glucose and a rose to 220. Currently she denies any symptoms. She states this happens commonly. Denies any history of ulceration, claudication symptoms, tingling or numbness. No other complaints at this time.   Review of Systems  HENT: Positive for hearing loss.   Respiratory: Positive for cough.   Musculoskeletal: Positive for back pain.  Neurological: Positive for headaches.  Hematological:       Slow to heal  Psychiatric/Behavioral: The patient is nervous/anxious.   All other systems reviewed and are negative.      Objective:   Physical Exam AAO x3, NAD DP/PT pulses palpable bilaterally, CRT less than 3 seconds Protective sensation intact with Simms Weinstein monofilament, vibratory sensation intact, Achilles tendon reflex intact Pinch calluses noted along the plantar aspect of digits 3 through 5 bilaterally with underlying adductovarus, hammertoe deformities. There is no open lesions, interdigital maceration. No tenderness at this time around the nails or any drainage or redness around the nail sites. MMT 5/5, ROM WNL No calf pain, swelling, warmth, erythema.        Assessment & Plan:  58 year old female with pinch calluses bilateral third through fifth digit with underlying adductovarus/hammertoe deformities. -Treatment options discussed including alternatives, risks, complications. -Hyperkeratotic lesion sharply debrided 6 without complications. -Discussed  the importance daily foot inspection. Discussed with her she should not cut the areas herself. -Dispensed offloading pads to help take pressure off the area. -Follow-up as needed. Call the office with any questions, concerns, change in symptoms. Follow up with PCP for other issues mentioned in the ROS.

## 2014-05-13 ENCOUNTER — Ambulatory Visit
Admission: RE | Admit: 2014-05-13 | Discharge: 2014-05-13 | Disposition: A | Discharge status: Home / Self Care | Payer: Commercial Managed Care - HMO | Source: Ambulatory Visit | Attending: Internal Medicine | Admitting: Internal Medicine

## 2014-05-13 DIAGNOSIS — Z1239 Encounter for other screening for malignant neoplasm of breast: Secondary | ICD-10-CM

## 2014-05-29 ENCOUNTER — Other Ambulatory Visit: Payer: Self-pay | Admitting: Internal Medicine

## 2014-06-08 ENCOUNTER — Other Ambulatory Visit: Payer: Self-pay | Admitting: Endocrinology

## 2014-06-09 ENCOUNTER — Ambulatory Visit (INDEPENDENT_AMBULATORY_CARE_PROVIDER_SITE_OTHER): Payer: Commercial Managed Care - HMO | Admitting: Endocrinology

## 2014-06-09 ENCOUNTER — Encounter: Payer: Self-pay | Admitting: Endocrinology

## 2014-06-09 VITALS — BP 124/62 | HR 92 | Temp 98.3°F | Resp 14 | Ht 64.0 in | Wt 139.0 lb

## 2014-06-09 DIAGNOSIS — IMO0002 Reserved for concepts with insufficient information to code with codable children: Secondary | ICD-10-CM

## 2014-06-09 DIAGNOSIS — E1065 Type 1 diabetes mellitus with hyperglycemia: Secondary | ICD-10-CM

## 2014-06-09 DIAGNOSIS — F331 Major depressive disorder, recurrent, moderate: Secondary | ICD-10-CM

## 2014-06-09 MED ORDER — ESCITALOPRAM OXALATE 20 MG PO TABS: 20.0000 mg | ORAL_TABLET | Freq: Every day | ORAL | Status: DC

## 2014-06-09 NOTE — Patient Instructions (Signed)
Lantus 8 in pm but when sleeping better reduce to 7 units  If Sugars low in ams reduce Lantus to 6

## 2014-06-09 NOTE — Progress Notes (Signed)
Patient ID: Leslie Lane, female   DOB: 1955/07/12, 58 y.o.   MRN: [id number]   Reason for Appointment: Diabetes follow-up   History of Present Illness   Diagnosis: Type 1 DIABETES MELITUS, diagnosed 1967     Insulin regimen: Lantus 10 units a.m., 7 units h.s. Humalog 1-2 p.r.n. With vial  She has had labile blood sugar control over the last several years even though A1c has been usually around 7% She has had less lability and hypoglycemia with taking b.i.d. Lantus compared once a day She has been very sensitive to fast acting insulin and frequently does not require mealtime coverage  RECENT history: She was having consistently low blood sugars before breakfast late last month with readings as low as 32 but did not make any changes in her Lantus insulin In the last 3-4 days she has had better readings and they have been higher in the morning the last 3 days because of her depression getting worse and her eating during the night Blood sugars are variable later in the day as usual with periods of blood sugars as low as 31 and as high as 242 More recently has taken more Humalog in the mornings but her blood sugars are significantly high without dropping her sugar She is also less active more recently because of her depression Again she has a good number of readings below target, currently 24% on her download but her standard deviation is 64 A1c was done in October and will be done again on her next visit  HYPOGLYCEMIA: As above Her mother knows how to give Glucagon injection She will take her low blood sugars usually with juice, does not like glucose tablets      DIET: She is usually not eating balanced meals and is eating high-fat snacks like chips and popcorn at various times.  Usually trying to avoid high-fat meals. She will eat more when she is anxious.  Monitors blood glucose:  about 5 times a day.    Glucometer: One Touch.      Blood Glucose readings from meter download:    PRE-MEAL Breakfast Lunch Dinner Bedtime Overall  Glucose range:  32-266   31-219   55-219   35-242    Median:  70      107     Lab Results  Component Value Date   HGBA1C 8.0* 04/27/2014   HGBA1C 8.0* 11/25/2013   HGBA1C 8.3* 09/10/2013   Lab Results  Component Value Date   MICROALBUR 0.7 04/27/2014   LDLCALC 96 11/25/2013   CREATININE 0.8 04/27/2014    No visits with results within 1 Week(s) from this visit. Latest known visit with results is:  Appointment on 04/27/2014  Component Date Value Ref Range Status  . Hgb A1c MFr Bld 04/27/2014 8.0* 4.6 - 6.5 % Final   Glycemic Control Guidelines for People with Diabetes:Non Diabetic:  <6%Goal of Therapy: <7%Additional Action Suggested:  >8%   . Sodium 04/27/2014 139  135 - 145 mEq/L Final  . Potassium 04/27/2014 4.2  3.5 - 5.1 mEq/L Final  . Chloride 04/27/2014 103  96 - 112 mEq/L Final  . CO2 04/27/2014 28  19 - 32 mEq/L Final  . Glucose, Bld 04/27/2014 135* 70 - 99 mg/dL Final  . BUN 04/27/2014 11  6 - 23 mg/dL Final  . Creatinine, Ser 04/27/2014 0.8  0.4 - 1.2 mg/dL Final  . Total Bilirubin 04/27/2014 0.4  0.2 - 1.2 mg/dL Final  . Alkaline Phosphatase 04/27/2014 55  39 - 117 U/L Final  . AST 04/27/2014 21  0 - 37 U/L Final  . ALT 04/27/2014 14  0 - 35 U/L Final  . Total Protein 04/27/2014 6.5  6.0 - 8.3 g/dL Final  . Albumin 04/27/2014 2.9* 3.5 - 5.2 g/dL Final  . Calcium 04/27/2014 8.9  8.4 - 10.5 mg/dL Final  . GFR 04/27/2014 75.58  >60.00 mL/min Final  . Microalb, Ur 04/27/2014 0.7  0.0 - 1.9 mg/dL Final  . Creatinine,U 04/27/2014 236.7   Final  . Microalb Creat Ratio 04/27/2014 0.3  0.0 - 30.0 mg/g Final  . Color, Urine 04/27/2014 YELLOW  Yellow;Lt. Yellow Final  . APPearance 04/27/2014 CLEAR  Clear Final  . Specific Gravity, Urine 04/27/2014 1.020  1.000-1.030 Final  . pH 04/27/2014 6.0  5.0 - 8.0 Final  . Total Protein, Urine 04/27/2014 NEGATIVE  Negative Final  . Urine Glucose 04/27/2014 NEGATIVE  Negative  Final  . Ketones, ur 04/27/2014 NEGATIVE  Negative Final  . Bilirubin Urine 04/27/2014 NEGATIVE  Negative Final  . Hgb urine dipstick 04/27/2014 NEGATIVE  Negative Final  . Urobilinogen, UA 04/27/2014 0.2  0.0 - 1.0 Final  . Leukocytes, UA 04/27/2014 NEGATIVE  Negative Final  . Nitrite 04/27/2014 NEGATIVE  Negative Final  . WBC, UA 04/27/2014 0-2/hpf  0-2/hpf Final  . RBC / HPF 04/27/2014 none seen  0-2/hpf Final  . Mucus, UA 04/27/2014 Presence of* None Final  . Squamous Epithelial / LPF 04/27/2014 Rare(0-4/hpf)  Rare(0-4/hpf) Final  . TSH 04/27/2014 0.78  0.35 - 4.50 uIU/mL Final        Medication List       This list is accurate as of: 06/09/14  1:34 PM.  Always use your most recent med list.               aspirin 81 MG tablet  Take 81 mg by mouth daily.     busPIRone 7.5 MG tablet  Commonly known as:  BUSPAR  Take 1 tablet (7.5 mg total) by mouth 3 (three) times daily. Start with 1 pill twice per day and then then 1 pill 3 times per day.     esomeprazole 40 MG capsule  Commonly known as:  NEXIUM  Take 1 capsule (40 mg total) by mouth daily before breakfast.     glucagon 1 MG injection  Commonly known as:  GLUCAGON EMERGENCY  Inject 1 mg into the skin once as needed.     glycopyrrolate 1 MG tablet  Commonly known as:  ROBINUL  Take 1 tablet (1 mg total) by mouth 2 (two) times daily.     hydrOXYzine 25 MG capsule  Commonly known as:  VISTARIL  Take 1 capsule (25 mg total) by mouth 3 (three) times daily as needed.     ibuprofen 600 MG tablet  Commonly known as:  ADVIL,MOTRIN  Take 1 tablet (600 mg total) by mouth every 6 (six) hours as needed for pain.     insulin lispro 100 UNIT/ML injection  Commonly known as:  HUMALOG  Sliding scale 10 units in am and 8 units at night     LANTUS 100 UNIT/ML injection  Generic drug:  insulin glargine  INJECT 10 UNITS INTO THE SKIN IN THE MORNING AS INSTRUCTED (DISCARD VIAL 28 DAYS AFTER OPENING)     loratadine 10 MG  tablet  Commonly known as:  CLARITIN  Take 10 mg by mouth as needed for allergies.     losartan 25 MG tablet  Commonly  known as:  COZAAR  TAKE 1 TABLET BY MOUTH DAILY     lovastatin 40 MG tablet  Commonly known as:  MEVACOR  Take 1 tablet (40 mg total) by mouth at bedtime.     multivitamin tablet  Take 1 tablet by mouth daily.     ONE TOUCH ULTRA TEST test strip  Generic drug:  glucose blood  USE  TO CHECK BLOOD SUGAR FOUR TIMES DAILY     traMADol 50 MG tablet  Commonly known as:  ULTRAM  Take 1 tablet (50 mg total) by mouth every 12 (twelve) hours as needed for moderate pain or severe pain.     traZODone 100 MG tablet  Commonly known as:  DESYREL  Take 1-2 tablets (100-200 mg total) by mouth at bedtime.     vitamin A 10000 UNIT capsule  Take 10,000 Units by mouth daily.     vitamin B-12 1000 MCG tablet  Commonly known as:  CYANOCOBALAMIN  Take 1,000 mcg by mouth daily.     vitamin E 1000 UNIT capsule  Generic drug:  vitamin E  Take 1,000 Units by mouth daily.        Allergies:  Allergies  Allergen Reactions  . Penicillins   . Sulfonamide Derivatives     Past Medical History  Diagnosis Date  . HEPATITIS B   . BIPOLAR AFFECTIVE DISORDER   . SMOKER   . MIGRAINE HEADACHE   . NECK PAIN, CHRONIC   . LOW BACK PAIN, CHRONIC   . ALLERGIC RHINITIS   . HYPERLIPIDEMIA   . GERD   . DIABETES MELLITUS, TYPE II     follows with endo  . DEPRESSION   . NARCOTIC ABUSE     hx of  . Anxiety   . Arthritis   . Fibromyalgia     Past Surgical History  Procedure Laterality Date  . Cesarean section      Family History  Problem Relation Age of Onset  . Arthritis Mother   . Arthritis Father   . Kidney disease Father   . Kidney cancer Father   . Hyperlipidemia Other   . Kidney cancer Paternal Aunt   . Heart attack Brother   . Bladder Cancer Sister     Social History:  reports that she has been smoking Cigarettes.  She has been smoking about 0.00 packs per day  for the past 40 years. She has never used smokeless tobacco. She reports that she drinks alcohol. She reports that she does not use illicit drugs.  Review of Systems:  DEPRESSION: She has had long-standing depression and anxiety previously followed by the mental health clinic and she says he does not get any treatment there She is currently taking Effexor without any benefit Does not think she is suicidal She is also having significant insomnia even with trazodone.  She is still waiting for referral for a psychiatrist  HYPERLIPIDEMIA: The lipid abnormality consists of elevated LDL previously treated  with simvastatin.  Now is on lovastatin 40 mg and does not complain of any unusual muscle aches Her LDL is  at target with the higher dose of lovastatin  Lab Results  Component Value Date   CHOL 185 11/25/2013   HDL 80.50 11/25/2013   LDLCALC 96 11/25/2013   LDLDIRECT 101.7 03/26/2012   TRIG 41.0 11/25/2013   CHOLHDL 2 11/25/2013    She has been treated with low-dose Cozaar by PCP, no history of hypertension or microalbuminuria  Has history of small goiter  but no hypothyroidism     Examination:   BP 124/62 mmHg  Pulse 92  Temp(Src) 98.3 F (36.8 C)  Resp 14  Ht 5\' 4"  (1.626 m)  Wt 139 lb (63.05 kg)  BMI 23.85 kg/m2  SpO2 95%  Body mass index is 23.85 kg/(m^2).   She appears fairly depressed and somewhat tearful at times  ASSESSMENT/ PLAN:   Diabetes type 1  The patient's diabetes is still difficult to control Again she is tending to have fasting hypoglycemia and she does not adjust her evening Lantus based on this More recently her blood sugars are normal to higher overnight because of excessive snacking during the night Blood sugars later in the day are variable and higher when she does not watch her diet or snacks She does need to avoid hypoglycemia because of previous episodes of severe hypoglycemia and also hypoglycemia unawareness More recently has not been very  active She continues to be on mostly basal insulin, requiring small doses of Humalog only when blood sugars are significantly higher if she is eating a lot of carbohydrates She will reduce her Lantus by 1 unit in the evening when her blood sugars started coming down more overnight Also discussed adjusting the dose further in the evening based on fasting blood sugar trend  DEPRESSION: She is having significant problems recently and since she had some benefit from Lexapro previously will try this again instead of Effexor.  Also psychiatry consultation requested  Counseling time over 50% of today's 25 minute visit  Layce Sprung 06/09/2014, 1:34 PM   -

## 2014-06-11 ENCOUNTER — Ambulatory Visit: Payer: Commercial Managed Care - HMO | Admitting: Endocrinology

## 2014-06-28 ENCOUNTER — Encounter: Payer: Self-pay | Admitting: Endocrinology

## 2014-07-14 ENCOUNTER — Telehealth: Payer: Self-pay | Admitting: Internal Medicine

## 2014-07-14 NOTE — Telephone Encounter (Signed)
Patient has a current referral to see [name] (approved, awaiting scheduling). She was seen on 06/09/2014 and her claim was denied because of the fact that she did not have a referral. She is requesting a possible retro referral for the 06/09/2014 visit.   She is also requesting a referral for Christophe Louis for a pap smear @ [facility]. She has an appointment scheduled for 08/18/2014 @ 11:00.

## 2014-07-16 NOTE — Telephone Encounter (Signed)
[facility] does not accept retro referrals. Pt is responsible for obtaining referral prior to any specialist appointments. Left message for patient to call office.

## 2014-07-16 NOTE — Telephone Encounter (Signed)
[facility] auth for Dr. Landry Mellow: [id number] valid 08/18/14-02/14/15 for 6 visits.

## 2014-07-23 NOTE — Telephone Encounter (Signed)
Dr. Jocelyn Lamer # [id number] valid 08/06/2014 - 02/02/2015 for 6 visits  Pt is aware.

## 2014-07-28 ENCOUNTER — Ambulatory Visit (INDEPENDENT_AMBULATORY_CARE_PROVIDER_SITE_OTHER): Payer: Commercial Managed Care - HMO | Admitting: Psychiatry

## 2014-07-28 ENCOUNTER — Encounter (INDEPENDENT_AMBULATORY_CARE_PROVIDER_SITE_OTHER): Payer: Self-pay

## 2014-07-28 ENCOUNTER — Encounter (HOSPITAL_COMMUNITY): Payer: Self-pay | Admitting: Psychiatry

## 2014-07-28 VITALS — BP 108/59 | HR 92 | Ht 64.0 in | Wt 143.0 lb

## 2014-07-28 DIAGNOSIS — F329 Major depressive disorder, single episode, unspecified: Secondary | ICD-10-CM

## 2014-07-28 DIAGNOSIS — F4010 Social phobia, unspecified: Secondary | ICD-10-CM

## 2014-07-28 DIAGNOSIS — F332 Major depressive disorder, recurrent severe without psychotic features: Secondary | ICD-10-CM | POA: Insufficient documentation

## 2014-07-28 DIAGNOSIS — F17200 Nicotine dependence, unspecified, uncomplicated: Secondary | ICD-10-CM | POA: Insufficient documentation

## 2014-07-28 DIAGNOSIS — F17210 Nicotine dependence, cigarettes, uncomplicated: Secondary | ICD-10-CM | POA: Insufficient documentation

## 2014-07-28 HISTORY — DX: Major depressive disorder, single episode, unspecified: F32.9

## 2014-07-28 MED ORDER — PAROXETINE HCL 20 MG PO TABS: 20.0000 mg | ORAL_TABLET | Freq: Every day | ORAL | Status: DC

## 2014-07-28 MED ORDER — BUSPIRONE HCL 15 MG PO TABS: 15.0000 mg | ORAL_TABLET | Freq: Every day | ORAL | Status: DC

## 2014-07-28 MED ORDER — TRAZODONE HCL 100 MG PO TABS: 100.0000 mg | ORAL_TABLET | Freq: Every day | ORAL | Status: DC

## 2014-07-28 MED ORDER — HYDROXYZINE PAMOATE 25 MG PO CAPS
25.0000 mg | ORAL_CAPSULE | Freq: Three times a day (TID) | ORAL | Status: DC | PRN
Start: 2014-07-28 — End: 2014-09-29
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
## 2014-08-03 ENCOUNTER — Other Ambulatory Visit: Payer: Self-pay | Admitting: Endocrinology

## 2014-08-06 ENCOUNTER — Other Ambulatory Visit (INDEPENDENT_AMBULATORY_CARE_PROVIDER_SITE_OTHER): Payer: Commercial Managed Care - HMO

## 2014-08-06 DIAGNOSIS — E1065 Type 1 diabetes mellitus with hyperglycemia: Secondary | ICD-10-CM

## 2014-08-06 DIAGNOSIS — IMO0002 Reserved for concepts with insufficient information to code with codable children: Secondary | ICD-10-CM

## 2014-08-06 LAB — HEMOGLOBIN A1C: HEMOGLOBIN A1C: 7.5 % — AB (ref 4.6–6.5)

## 2014-08-06 LAB — BASIC METABOLIC PANEL
BUN: 11 mg/dL (ref 6–23)
CO2: 30 mEq/L (ref 19–32)
Calcium: 9.4 mg/dL (ref 8.4–10.5)
Chloride: 102 mEq/L (ref 96–112)
Creatinine, Ser: 1.02 mg/dL (ref 0.40–1.20)
GFR: 58.69 mL/min — ABNORMAL LOW (ref >60.00)
Glucose, Bld: 214 mg/dL — ABNORMAL HIGH (ref 70–99)
POTASSIUM: 4.5 meq/L (ref 3.5–5.1)
SODIUM: 137 meq/L (ref 135–145)

## 2014-08-10 ENCOUNTER — Encounter: Payer: Self-pay | Admitting: Endocrinology

## 2014-08-10 ENCOUNTER — Ambulatory Visit (INDEPENDENT_AMBULATORY_CARE_PROVIDER_SITE_OTHER): Payer: Commercial Managed Care - HMO | Admitting: Endocrinology

## 2014-08-10 VITALS — BP 100/50 | HR 78 | Temp 98.0°F | Resp 14 | Ht 64.0 in | Wt 145.6 lb

## 2014-08-10 DIAGNOSIS — E785 Hyperlipidemia, unspecified: Secondary | ICD-10-CM | POA: Diagnosis not present

## 2014-08-10 DIAGNOSIS — R031 Nonspecific low blood-pressure reading: Secondary | ICD-10-CM

## 2014-08-10 DIAGNOSIS — IMO0002 Reserved for concepts with insufficient information to code with codable children: Secondary | ICD-10-CM

## 2014-08-10 DIAGNOSIS — E1065 Type 1 diabetes mellitus with hyperglycemia: Secondary | ICD-10-CM

## 2014-08-10 NOTE — Progress Notes (Signed)
Patient ID: Leslie Lane, female   DOB: [birth year], 59 y.o.   MRN: [id number]   Reason for Appointment: Diabetes follow-up   History of Present Illness   Diagnosis: Type 1 DIABETES MELITUS, diagnosed 1967     Insulin regimen: Lantus 10 units a.m., 8 units h.s. Humalog 1-2 p.r.n. With vial  She has had labile blood sugar control over the last several years even though A1c has been usually around 7% She has had less lability and hypoglycemia with taking b.i.d. Lantus compared once a day She has been very sensitive to fast acting insulin and frequently does not require mealtime coverage  RECENT history: Her A1c is relatively better compared to her previous visit This is despite her hypoglycemia is relatively less often compared to her last visit She was told to take only 7 units of Lantus at bedtime but she is still taking 8 units around 8 PM   She is still requiring minimal amounts of Humalog only for correction  And not for regular mealtime coverage Current blood sugar patterns and problems identified:  She is still having periodic low blood sugars at various times especially around lunchtime and some at bedtime, rarely at breakfast and suppertime  She does not have as many high blood sugar readings, occasionally these are the result of rebound from hypoglycemia such as a reading of 510 preceded by a glucose of 27 at bedtime  Not clear why she is having low blood sugars at bedtime as she usually does not take Humalog at suppertime unless blood sugar is very high; she does take her Lantus about 2 hours after evening meal.  Some of her low sugars at lunch time be related to her eating very little protein at breakfast now and mostly cereal and fruit.  She may then do some physical activity like housecleaning  Fasting blood sugars are variable but overall near normal recently, lowest reading 48 about 10 days ago  About 17% of her readings are in the low range compared to 24%  previously  Some of her high readings may be related to eating sweets like ice cream and high-fat snacks  HYPOGLYCEMIA: As above, may not have symptoms with low blood sugars consistently and does not have early warning symptoms. Her mother knows how to give Glucagon injection She will treat her low blood sugars usually with juice, does not like glucose tablets      DIET: She is usually not eating balanced meals and is eating cereal and fruit Has high-fat snacks like chips, ice cream and popcorn at various times.  She will eat more when she is anxious.  Monitors blood glucose:  about 5 times a day.    Glucometer: One Touch.      Blood Glucose readings from meter download:   PRE-MEAL Breakfast Lunch Dinner Bedtime Overall  Glucose range:  48-510   43-206   49-343   27-232   25-510   Median:  139     103   130    POST-MEAL PC Breakfast PC Lunch PC Dinner  Glucose range:    111-260   Mean/median:    163     Wt Readings from Last 3 Encounters:  08/10/14 145 lb 9.6 oz (66.044 kg)  07/28/14 143 lb (64.864 kg)  06/09/14 139 lb (63.05 kg)    Lab Results  Component Value Date   HGBA1C 7.5* 08/06/2014   HGBA1C 8.0* 04/27/2014   HGBA1C 8.0* 11/25/2013   Lab Results  Component Value  Date   MICROALBUR 0.7 04/27/2014   LDLCALC 96 11/25/2013   CREATININE 1.02 08/06/2014    Appointment on 08/06/2014  Component Date Value Ref Range Status  . Hgb A1c MFr Bld 08/06/2014 7.5* 4.6 - 6.5 % Final   Glycemic Control Guidelines for People with Diabetes:Non Diabetic:  <6%Goal of Therapy: <7%Additional Action Suggested:  >8%   . Sodium 08/06/2014 137  135 - 145 mEq/L Final  . Potassium 08/06/2014 4.5  3.5 - 5.1 mEq/L Final  . Chloride 08/06/2014 102  96 - 112 mEq/L Final  . CO2 08/06/2014 30  19 - 32 mEq/L Final  . Glucose, Bld 08/06/2014 214* 70 - 99 mg/dL Final  . BUN 08/06/2014 11  6 - 23 mg/dL Final  . Creatinine, Ser 08/06/2014 1.02  0.40 - 1.20 mg/dL Final  . Calcium 08/06/2014 9.4   8.4 - 10.5 mg/dL Final  . GFR 08/06/2014 58.69* >60.00 mL/min Final        Medication List       This list is accurate as of: 08/10/14  1:44 PM.  Always use your most recent med list.               aspirin 81 MG tablet  Take 81 mg by mouth daily.     busPIRone 15 MG tablet  Commonly known as:  BUSPAR  Take 1 tablet (15 mg total) by mouth at bedtime.     escitalopram 20 MG tablet  Commonly known as:  LEXAPRO     esomeprazole 40 MG capsule  Commonly known as:  NEXIUM  Take 1 capsule (40 mg total) by mouth daily before breakfast.     fluocinonide 0.05 % external solution  Commonly known as:  LIDEX     glucagon 1 MG injection  Commonly known as:  GLUCAGON EMERGENCY  Inject 1 mg into the skin once as needed.     glycopyrrolate 1 MG tablet  Commonly known as:  ROBINUL  Take 1 tablet (1 mg total) by mouth 2 (two) times daily.     hydrOXYzine 25 MG capsule  Commonly known as:  VISTARIL  Take 1 capsule (25 mg total) by mouth 3 (three) times daily as needed.     ibuprofen 600 MG tablet  Commonly known as:  ADVIL,MOTRIN  Take 1 tablet (600 mg total) by mouth every 6 (six) hours as needed for pain.     insulin lispro 100 UNIT/ML injection  Commonly known as:  HUMALOG  Sliding scale 10 units in am and 8 units at night     LANTUS 100 UNIT/ML injection  Generic drug:  insulin glargine  INJECT 10 UNITS INTO THE SKIN IN THE MORNING AS INSTRUCTED (DISCARD VIAL 28 DAYS AFTER OPENING)     loratadine 10 MG tablet  Commonly known as:  CLARITIN  Take 10 mg by mouth as needed for allergies.     losartan 25 MG tablet  Commonly known as:  COZAAR  TAKE 1 TABLET BY MOUTH DAILY     lovastatin 40 MG tablet  Commonly known as:  MEVACOR  Take 1 tablet (40 mg total) by mouth at bedtime.     multivitamin tablet  Take 1 tablet by mouth daily.     ONE TOUCH ULTRA TEST test strip  Generic drug:  glucose blood  USE  TO CHECK BLOOD SUGAR FOUR TIMES DAILY     PARoxetine 20 MG tablet   Commonly known as:  PAXIL  Take 1 tablet (20 mg total)  by mouth daily.     traMADol 50 MG tablet  Commonly known as:  ULTRAM  Take 1 tablet (50 mg total) by mouth every 12 (twelve) hours as needed for moderate pain or severe pain.     traZODone 100 MG tablet  Commonly known as:  DESYREL  Take 1-2 tablets (100-200 mg total) by mouth at bedtime.     vitamin A 10000 UNIT capsule  Take 10,000 Units by mouth daily.     vitamin B-12 1000 MCG tablet  Commonly known as:  CYANOCOBALAMIN  Take 1,000 mcg by mouth daily.     vitamin E 1000 UNIT capsule  Generic drug:  vitamin E  Take 1,000 Units by mouth daily.        Allergies:  Allergies  Allergen Reactions  . Penicillins   . Sulfonamide Derivatives     Past Medical History  Diagnosis Date  . HEPATITIS B   . BIPOLAR AFFECTIVE DISORDER   . SMOKER   . MIGRAINE HEADACHE   . NECK PAIN, CHRONIC   . LOW BACK PAIN, CHRONIC   . ALLERGIC RHINITIS   . HYPERLIPIDEMIA   . GERD   . DIABETES MELLITUS, TYPE II     follows with endo  . DEPRESSION   . NARCOTIC ABUSE     hx of  . Anxiety   . Arthritis   . Fibromyalgia   . Diabetes mellitus type I     Past Surgical History  Procedure Laterality Date  . Cesarean section      Family History  Problem Relation Age of Onset  . Arthritis Mother   . Arthritis Father   . Kidney disease Father   . Kidney cancer Father   . Hyperlipidemia Other   . Kidney cancer Paternal Aunt   . Heart attack Brother   . Bladder Cancer Sister   . Anxiety disorder Neg Hx   . Bipolar disorder Neg Hx   . Depression Neg Hx     Social History:  reports that she has been smoking Cigarettes.  She has a 40 pack-year smoking history. She has never used smokeless tobacco. She reports that she drinks alcohol. She reports that she does not use illicit drugs.  Review of Systems:  DEPRESSION: She has had long-standing depression and anxiety previously followed by the mental health clinic Apparently was  admitted for depression recently and now taking Lexapro.  She claims that actually makes her gain weight  HYPERLIPIDEMIA: The lipid abnormality consists of elevated LDL  Now is on lovastatin 40 mg and does not complain of any unusual muscle aches Her LDL is  at target as of 5/15  Lab Results  Component Value Date   CHOL 185 11/25/2013   HDL 80.50 11/25/2013   LDLCALC 96 11/25/2013   LDLDIRECT 101.7 03/26/2012   TRIG 41.0 11/25/2013   CHOLHDL 2 11/25/2013    She has been treated with low-dose Cozaar by PCP, no history of hypertension or microalbuminuria. Does appear to have relatively low blood pressure today, not complaining about lightheadedness.  Has history of small goiter but no hypothyroidism  Lab Results  Component Value Date   TSH 0.78 04/27/2014        Examination:   BP 138/96 mmHg  Pulse 78  Temp(Src) 98 F (36.7 C)  Resp 14  Ht 5\' 4"  (1.626 m)  Wt 145 lb 9.6 oz (66.044 kg)  BMI 24.98 kg/m2  SpO2 97%  Body mass index is 24.98 kg/(m^2).   She  appears pleasant and not unusually depressed or anxious  Blood pressure 100/58 sitting and 100/50 standing done at the end of the exam No pedal edema  ASSESSMENT/ PLAN:   Diabetes type 1 See history of present illness for detailed discussion of current insulin, blood sugar patterns, problems identified The patient's diabetes is somewhat better with improved A1c despite less frequent hypoglycemia. Her lower blood sugar readings are as a result of unbalanced meals in the morning, increased activity at times as well as usual labile blood sugars. She may have an early onset of action of Lantus as her evening dose tends to cause low blood sugars at bedtime Also she did not reduce her evening Lantus dose as directed on the last visit and does not usually adjust the dose on her own based on fasting blood sugar.  Her hyperglycemia is only occasional with eating unbalanced meals or sweets as well as when she has rebound from  hypoglycemia.   Discussed  With her that she needs to avoid hypoglycemia because of her tendency to severe hypoglycemia.  Also may benefit from taking her evening Lantus at bedtime instead of around 8 PM. Also emphasized the need for balanced meals especially breakfast and extra snacks when more active   Since she is needing constant reminders about improving her day-to-day care she will also follow-up with nurse educator in about a month for reassessment.    She is concerned about her tendency to weight gain and discussed need to improve her diet as well as avoid hypoglycemia.    Orthostatic hypotension: although she is asymptomatic blood pressure is relatively low and her creatinine is relatively higher.  She will hold off on losartan for now.  Patient Instructions  Lantus 7 units at 11 pm  Eggs or low fat meat and toast  in am, avoid cereal in am  Stop Losartan    Counseling time over 50% of today's 25 minute visit  Janace Decker 08/10/2014, 1:44 PM   -

## 2014-08-10 NOTE — Patient Instructions (Addendum)
Lantus 7 units at 11 pm  Eggs or low fat meat and toast  in am, avoid cereal in am  Stop Losartan

## 2014-08-17 ENCOUNTER — Telehealth: Payer: Self-pay | Admitting: Endocrinology

## 2014-08-17 MED ORDER — INSULIN GLARGINE 100 UNIT/ML ~~LOC~~ SOLN: SUBCUTANEOUS | Status: DC

## 2014-08-17 MED ORDER — GLUCOSE BLOOD VI STRP: ORAL_STRIP | Status: DC

## 2014-08-17 NOTE — Telephone Encounter (Signed)
Patient called and would like her Rx to be called in   Rx:  Lantus Test strips  Pharmacy: [facility]   Thank you

## 2014-08-18 ENCOUNTER — Other Ambulatory Visit (HOSPITAL_COMMUNITY)
Admission: RE | Admit: 2014-08-18 | Discharge: 2014-08-18 | Disposition: A | Discharge status: Home / Self Care | Payer: Commercial Managed Care - HMO | Source: Ambulatory Visit | Attending: Obstetrics and Gynecology | Admitting: Obstetrics and Gynecology

## 2014-08-18 ENCOUNTER — Other Ambulatory Visit: Payer: Self-pay | Admitting: Obstetrics and Gynecology

## 2014-08-18 DIAGNOSIS — Z1151 Encounter for screening for human papillomavirus (HPV): Secondary | ICD-10-CM | POA: Insufficient documentation

## 2014-08-18 DIAGNOSIS — Z124 Encounter for screening for malignant neoplasm of cervix: Secondary | ICD-10-CM | POA: Diagnosis not present

## 2014-08-18 DIAGNOSIS — Z01419 Encounter for gynecological examination (general) (routine) without abnormal findings: Secondary | ICD-10-CM | POA: Diagnosis not present

## 2014-08-19 ENCOUNTER — Encounter (HOSPITAL_COMMUNITY): Payer: Self-pay | Admitting: Clinical

## 2014-08-19 ENCOUNTER — Ambulatory Visit (INDEPENDENT_AMBULATORY_CARE_PROVIDER_SITE_OTHER): Payer: Commercial Managed Care - HMO | Admitting: Clinical

## 2014-08-19 DIAGNOSIS — F418 Other specified anxiety disorders: Secondary | ICD-10-CM

## 2014-08-19 DIAGNOSIS — F331 Major depressive disorder, recurrent, moderate: Secondary | ICD-10-CM

## 2014-08-19 DIAGNOSIS — F4010 Social phobia, unspecified: Secondary | ICD-10-CM

## 2014-08-19 LAB — CYTOLOGY - PAP
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
## 2014-08-22 ENCOUNTER — Other Ambulatory Visit: Payer: Self-pay | Admitting: Internal Medicine

## 2014-09-03 ENCOUNTER — Ambulatory Visit (INDEPENDENT_AMBULATORY_CARE_PROVIDER_SITE_OTHER): Payer: Commercial Managed Care - HMO | Admitting: Clinical

## 2014-09-03 DIAGNOSIS — F331 Major depressive disorder, recurrent, moderate: Secondary | ICD-10-CM

## 2014-09-03 DIAGNOSIS — F4010 Social phobia, unspecified: Secondary | ICD-10-CM | POA: Diagnosis not present

## 2014-09-07 ENCOUNTER — Encounter (HOSPITAL_COMMUNITY): Payer: Self-pay | Admitting: Clinical

## 2014-09-07 NOTE — Progress Notes (Signed)
   THERAPIST PROGRESS NOTE  Session Time: 1:30 -2:30  Participation Level: Active  Behavioral Response: Casual and NeatAlertAnxious  Type of Therapy: Individual Therapy  Treatment Goals addressed: improve psychiatric symptoms, emotional regulation skills  Interventions: Motivational Interviewing and Other: Grounding and mindfulness  Summary: Leslie Lane is a 59 y.o. female who presents with Major depressive Disorder,recurrent Moderate, and Social anxiety disorder   Suicidal/Homicidal: Nowithout intent/plan  Therapist Response:  Manuela Schwartz met with clinician for an individual session. Leslie Lane discussed her psychiatric symptoms and her current life events. Leslie Lane shared that she continues to experience a lot of anxiety. She shared that she had her teeth whitened so that her daughter would fel better about her appearance. Leslie Lane shared that she had a greed to go shopping with a friend. She shared that she had not seen this friend in four months due to her anxiety. Leslie Lane shared that she was getting anxious just thinking about going shopping with the friend though they have know each other for over ten years. Leslie Lane and clinician discussed the coping strategies Leslie Lane planned to use. Leslie Lane shared that she planned to just grin and bear it. Clinician introduced grounding and mindfulness techniques. Clinician explained the process and purpose. Leslie Lane and clinician practiced some of the techniques together. Leslie Lane agreed to practice the techniques daily until next session. Plan: Return again in 1- 2 weeks.  Diagnosis: Axis I: Major depressive Disorder,recurrent Moderate, and Social anxiety disorder        Teal Bontrager A, LCSW 09/07/2014

## 2014-09-08 ENCOUNTER — Encounter: Payer: Self-pay | Admitting: Nutrition

## 2014-09-17 ENCOUNTER — Ambulatory Visit (HOSPITAL_COMMUNITY): Payer: Self-pay | Admitting: Clinical

## 2014-09-24 ENCOUNTER — Other Ambulatory Visit (HOSPITAL_COMMUNITY): Payer: Self-pay | Admitting: Psychiatry

## 2014-09-24 NOTE — Telephone Encounter (Signed)
Called patient to follow up on if she is still taking Paxil and questioned if she had restarted Lexapro and who this was prescribed by at this time.  Patient stated" I stopped taking that Paxil as I was gaining too much weight" and "I'm not taking anything that makes me gain weight like that".  Patient stated she took Lexapro she had a home after the Paxil but admits now out of this for over a month too.  Patient reported "I come in to see her", speaking of Dr. Doyne Keel "next week" and stated she would discuss medications options then but does not want anything that will cause weight gain.  Patient admitted to continued symptoms of depression with some isolating at home but denied any suicidal or homicidal ideations.  Patient stated she would be fine until she sees Dr. Doyne Keel on 09/29/14 and reported also having enoughTrazodone medications until that date.

## 2014-09-24 NOTE — Telephone Encounter (Signed)
I prescribed this pt Paxil but it looks like she is taking Lexapro. I had d/c Lexapro and don't see any documentation about [place] restarting it. Can we clarify what antidepressant she is on?

## 2014-09-29 ENCOUNTER — Ambulatory Visit (INDEPENDENT_AMBULATORY_CARE_PROVIDER_SITE_OTHER): Payer: Commercial Managed Care - HMO | Admitting: Psychiatry

## 2014-09-29 ENCOUNTER — Encounter (HOSPITAL_COMMUNITY): Payer: Self-pay | Admitting: Psychiatry

## 2014-09-29 VITALS — BP 145/75 | HR 77 | Ht 64.0 in | Wt 146.8 lb

## 2014-09-29 DIAGNOSIS — F4010 Social phobia, unspecified: Secondary | ICD-10-CM

## 2014-09-29 DIAGNOSIS — F17200 Nicotine dependence, unspecified, uncomplicated: Secondary | ICD-10-CM

## 2014-09-29 DIAGNOSIS — F332 Major depressive disorder, recurrent severe without psychotic features: Secondary | ICD-10-CM

## 2014-09-29 MED ORDER — TRAZODONE HCL 100 MG PO TABS: 100.0000 mg | ORAL_TABLET | Freq: Every day | ORAL | Status: DC

## 2014-09-29 MED ORDER — ESCITALOPRAM OXALATE 20 MG PO TABS: ORAL_TABLET | ORAL | Status: DC

## 2014-09-29 MED ORDER — BUSPIRONE HCL 15 MG PO TABS: 15.0000 mg | ORAL_TABLET | Freq: Every day | ORAL | Status: DC
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
## 2014-10-04 ENCOUNTER — Other Ambulatory Visit: Payer: Self-pay | Admitting: Endocrinology

## 2014-10-13 ENCOUNTER — Encounter: Payer: Self-pay | Admitting: Internal Medicine

## 2014-10-13 ENCOUNTER — Ambulatory Visit (INDEPENDENT_AMBULATORY_CARE_PROVIDER_SITE_OTHER): Payer: Commercial Managed Care - HMO | Admitting: Internal Medicine

## 2014-10-13 VITALS — BP 98/58 | HR 104 | Temp 99.0°F | Resp 18 | Ht 64.0 in | Wt 145.8 lb

## 2014-10-13 DIAGNOSIS — E1065 Type 1 diabetes mellitus with hyperglycemia: Secondary | ICD-10-CM | POA: Diagnosis not present

## 2014-10-13 DIAGNOSIS — IMO0002 Reserved for concepts with insufficient information to code with codable children: Secondary | ICD-10-CM

## 2014-10-13 MED ORDER — DICLOFENAC SODIUM 1 % TD GEL: 2.0000 g | Freq: Four times a day (QID) | TRANSDERMAL | Status: DC

## 2014-10-13 MED ORDER — TRAMADOL HCL 50 MG PO TABS: 50.0000 mg | ORAL_TABLET | Freq: Two times a day (BID) | ORAL | Status: DC | PRN

## 2014-10-13 NOTE — Patient Instructions (Signed)
We have sent in a pain cream that you can rub on the feet for when they are burning.   We are not checking any labs today but will see you back in about 3-6 months for a check up.   Exercise to Lose Weight Exercise and a healthy diet may help you lose weight. Your doctor may suggest specific exercises. EXERCISE IDEAS AND TIPS  Choose low-cost things you enjoy doing, such as walking, bicycling, or exercising to workout videos.  Take stairs instead of the elevator.  Walk during your lunch break.  Park your car further away from work or school.  Go to a gym or an exercise class.  Start with 5 to 10 minutes of exercise each day. Build up to 30 minutes of exercise 4 to 6 days a week.  Wear shoes with good support and comfortable clothes.  Stretch before and after working out.  Work out until you breathe harder and your heart beats faster.  Drink extra water when you exercise.  Do not do so much that you hurt yourself, feel dizzy, or get very short of breath. Exercises that burn about 150 calories:  Running 1  miles in 15 minutes.  Playing volleyball for 45 to 60 minutes.  Washing and waxing a car for 45 to 60 minutes.  Playing touch football for 45 minutes.  Walking 1  miles in 35 minutes.  Pushing a stroller 1  miles in 30 minutes.  Playing basketball for 30 minutes.  Raking leaves for 30 minutes.  Bicycling 5 miles in 30 minutes.  Walking 2 miles in 30 minutes.  Dancing for 30 minutes.  Shoveling snow for 15 minutes.  Swimming laps for 20 minutes.  Walking up stairs for 15 minutes.  Bicycling 4 miles in 15 minutes.  Gardening for 30 to 45 minutes.  Jumping rope for 15 minutes.  Washing windows or floors for 45 to 60 minutes. Document Released: 07/22/2010 Document Revised: 09/11/2011 Document Reviewed: 07/22/2010 [facility] Patient Information 2015 [place], [place]. This information is not intended to replace advice given to you by your health care  provider. Make sure you discuss any questions you have with your health care provider.

## 2014-10-13 NOTE — Progress Notes (Signed)
Pre visit review using our clinic review tool, if applicable. No additional management support is needed unless otherwise documented below in the visit note. 

## 2014-10-14 ENCOUNTER — Ambulatory Visit: Payer: Commercial Managed Care - HMO | Admitting: Internal Medicine

## 2014-10-16 NOTE — Progress Notes (Signed)
   Subjective:    Patient ID: Leslie Lane, female    DOB: Nov 01, 1955, 59 y.o.   MRN: [id number]  HPI The patient is a 59 YO female who is coming in for new problem of burning in her feet and sometimes in her legs. She denies numbness or lack of feeling in her feet. It happens at night mostly but can happen during the day. Rates as mild, intermittent. New problem in the last 2-3 months. Has not tried anything for it. Does have longstanding diabetes that has been uncontrolled at times. (not that well controlled recently on two kinds of insulin)  Review of Systems  Constitutional: Negative for fever, activity change, appetite change, fatigue and unexpected weight change.  Respiratory: Negative.   Cardiovascular: Negative.   Gastrointestinal: Negative.   Musculoskeletal: Negative.   Neurological: Negative for dizziness, syncope, weakness, numbness and headaches.       Burning in feet      Objective:   Physical Exam  Constitutional: She is oriented to person, place, and time. She appears well-developed and well-nourished.  HENT:  Head: Normocephalic and atraumatic.  Cardiovascular: Normal rate.   Pulmonary/Chest: Effort normal and breath sounds normal.  Abdominal: Soft.  Musculoskeletal: She exhibits no edema.  Neurological: She is alert and oriented to person, place, and time. Coordination normal.  Sensation intact in the feet and legs bilaterally, good color, pulses present in the PT and DP bilaterally.   Skin: Skin is warm and dry.   Filed Vitals:   10/13/14 1524  BP: 98/58  Pulse: 104  Temp: 99 F (37.2 C)  TempSrc: Oral  Resp: 18  Height: 5\' 4"  (1.626 m)  Weight: 145 lb 12.8 oz (66.134 kg)  SpO2: 95%      Assessment & Plan:

## 2014-10-16 NOTE — Assessment & Plan Note (Signed)
Follows with endocrinology. This new burning is likely early neuropathy from her poorly (overall) controlled diabetes. Has been better controlled recently. Offered to trial gabapentin but she does not want to do this yet. Not significant enough for medication at this time. Talked to her about how good control can decrease neuropathy and progression but her length of diabetes puts her at high risk for this and other complications.

## 2014-10-19 ENCOUNTER — Other Ambulatory Visit (HOSPITAL_COMMUNITY): Payer: Self-pay | Admitting: Psychiatry

## 2014-10-27 ENCOUNTER — Telehealth (HOSPITAL_COMMUNITY): Payer: Self-pay | Admitting: *Deleted

## 2014-10-27 NOTE — Telephone Encounter (Signed)
Prior authorization for Escitalopram received. Called [phone] was told to Complete online with covermymeds. Submitted, awaiting decision.

## 2014-10-28 ENCOUNTER — Telehealth (HOSPITAL_COMMUNITY): Payer: Self-pay

## 2014-10-28 NOTE — Telephone Encounter (Signed)
Patient's prior authorization received for her Escitalopram.  Approval from [facility] through until 10/28/15.  Member NO:[id number]

## 2014-11-05 ENCOUNTER — Other Ambulatory Visit (INDEPENDENT_AMBULATORY_CARE_PROVIDER_SITE_OTHER): Payer: Commercial Managed Care - HMO

## 2014-11-05 DIAGNOSIS — IMO0002 Reserved for concepts with insufficient information to code with codable children: Secondary | ICD-10-CM

## 2014-11-05 DIAGNOSIS — E1065 Type 1 diabetes mellitus with hyperglycemia: Secondary | ICD-10-CM | POA: Diagnosis not present

## 2014-11-05 LAB — BASIC METABOLIC PANEL
BUN: 10 mg/dL (ref 6–23)
CHLORIDE: 102 meq/L (ref 96–112)
CO2: 31 meq/L (ref 19–32)
Calcium: 9.3 mg/dL (ref 8.4–10.5)
Creatinine, Ser: 0.81 mg/dL (ref 0.40–1.20)
GFR: 76.52 mL/min (ref >60.00)
Glucose, Bld: 84 mg/dL (ref 70–99)
Potassium: 4.2 mEq/L (ref 3.5–5.1)
Sodium: 136 mEq/L (ref 135–145)

## 2014-11-05 LAB — LIPID PANEL
CHOL/HDL RATIO: 3
Cholesterol: 199 mg/dL (ref 0–200)
HDL: 76.8 mg/dL (ref >39.00)
LDL Cholesterol: 108 mg/dL — ABNORMAL HIGH (ref 0–99)
NonHDL: 122.2
Triglycerides: 69 mg/dL (ref 0.0–149.0)
VLDL: 13.8 mg/dL (ref 0.0–40.0)

## 2014-11-05 LAB — HEMOGLOBIN A1C: HEMOGLOBIN A1C: 7.4 % — AB (ref 4.6–6.5)

## 2014-11-09 ENCOUNTER — Encounter: Payer: Self-pay | Admitting: Endocrinology

## 2014-11-09 ENCOUNTER — Ambulatory Visit (INDEPENDENT_AMBULATORY_CARE_PROVIDER_SITE_OTHER): Payer: Commercial Managed Care - HMO | Admitting: Endocrinology

## 2014-11-09 VITALS — BP 140/64 | HR 101 | Temp 97.9°F | Resp 16 | Ht 64.0 in | Wt 149.8 lb

## 2014-11-09 DIAGNOSIS — E1065 Type 1 diabetes mellitus with hyperglycemia: Secondary | ICD-10-CM | POA: Diagnosis not present

## 2014-11-09 DIAGNOSIS — IMO0002 Reserved for concepts with insufficient information to code with codable children: Secondary | ICD-10-CM

## 2014-11-09 DIAGNOSIS — E785 Hyperlipidemia, unspecified: Secondary | ICD-10-CM | POA: Diagnosis not present

## 2014-11-09 NOTE — Patient Instructions (Addendum)
Have a protein at bedtime with snack  Reduce Lantus to 9 in am and 6 at bedtime  Take Humalog only when eating excessive Carbs or high fat snacks, not AFTER the sugar goes up  Check sugar at 3 am weekly at least

## 2014-11-09 NOTE — Progress Notes (Signed)
Patient ID: Leslie Lane, female   DOB: [birth year], 59 y.o.   MRN: [id number]   Reason for Appointment: Diabetes follow-up   History of Present Illness   Diagnosis: Type 1 DIABETES MELITUS, diagnosed 1967     Insulin regimen: Lantus 10 units a.m., 7 units h.s. Humalog 1-2 p.r.n. With vial  She has had labile blood sugar control over the last several years even though A1c has been usually around 7% She has had less lability and hypoglycemia with taking b.i.d. Lantus compared once a day She has been very sensitive to fast acting insulin and frequently does not require mealtime coverage  RECENT history: Her A1c is  about the same compared to her previous visit She was told to take only 7 units of Lantus at bedtime instead of at 8 PM and evening and she is doing this usually consistently; previously was tending to have low sugars at bedtime  She is still requiring minimal amounts of Humalog and usually only for correcting significantly high readings Current blood sugar patterns and problems identified:  She does not take Humalog with mealtimes, usually postprandial readings are okay unless she is going off her diet  She will frequently take Humalog when the blood sugars are high over 200 because she is afraid a high readings.  This will tend to sometimes cause hypoglycemia subsequently  22% of her readings are below 70, most frequently overnight and in the mornings and occasionally around 9 PM.  Previously she had only 17% of her readings in the low range line does not check any readings during the night  She has periodic high readings at all different times also but her overall highest median reading is around midday   Some of her high readings may be related to eating sweets like cookies, ice cream and high-fat snacks  HYPOGLYCEMIA: As above, may not have symptoms with low blood sugars consistently and does not have early warning symptoms. Her mother knows how to give  Glucagon injection She will treat her low blood sugars usually with juice, does not like glucose tablets      DIET: She is usually not eating balanced meals although trying to get protein with eggs in the morning now sometimes Meal times: dinner 5-6 p.m. Has high-fat snacks like chips, ice cream and popcorn at various times.  She will eat more when she is anxious.  Monitors blood glucose:  about 4-5 times a day.    Glucometer: One Touch.      Blood Glucose readings from meter download:   PRE-MEAL Breakfast Lunch Dinner Bedtime Overall  Glucose range: 33-309  34-210    106-209    median:  95     107 +/-63    POST-MEAL PC Breakfast PC Lunch PC Dinner  Glucose range:    47-207   Mean/median:          Wt Readings from Last 3 Encounters:  11/09/14 149 lb 12.8 oz (67.949 kg)  10/13/14 145 lb 12.8 oz (66.134 kg)  09/29/14 146 lb 12.8 oz (66.588 kg)    Lab Results  Component Value Date   HGBA1C 7.4* 11/05/2014   HGBA1C 7.5* 08/06/2014   HGBA1C 8.0* 04/27/2014   Lab Results  Component Value Date   MICROALBUR 0.7 04/27/2014   LDLCALC 108* 11/05/2014   CREATININE 0.81 11/05/2014    Lab on 11/05/2014  Component Date Value Ref Range Status  . Hgb A1c MFr Bld 11/05/2014 7.4* 4.6 - 6.5 % Final  Glycemic Control Guidelines for People with Diabetes:Non Diabetic:  <6%Goal of Therapy: <7%Additional Action Suggested:  >8%   . Sodium 11/05/2014 136  135 - 145 mEq/L Final  . Potassium 11/05/2014 4.2  3.5 - 5.1 mEq/L Final  . Chloride 11/05/2014 102  96 - 112 mEq/L Final  . CO2 11/05/2014 31  19 - 32 mEq/L Final  . Glucose, Bld 11/05/2014 84  70 - 99 mg/dL Final  . BUN 11/05/2014 10  6 - 23 mg/dL Final  . Creatinine, Ser 11/05/2014 0.81  0.40 - 1.20 mg/dL Final  . Calcium 11/05/2014 9.3  8.4 - 10.5 mg/dL Final  . GFR 11/05/2014 76.52  >60.00 mL/min Final  . Cholesterol 11/05/2014 199  0 - 200 mg/dL Final   ATP III Classification       Desirable:  < 200 mg/dL               Borderline  High:  200 - 239 mg/dL          High:  > = 240 mg/dL  . Triglycerides 11/05/2014 69.0  0.0 - 149.0 mg/dL Final   Normal:  <150 mg/dLBorderline High:  150 - 199 mg/dL  . HDL 11/05/2014 76.80  >39.00 mg/dL Final  . VLDL 11/05/2014 13.8  0.0 - 40.0 mg/dL Final  . LDL Cholesterol 11/05/2014 108* 0 - 99 mg/dL Final  . Total CHOL/HDL Ratio 11/05/2014 3   Final                  Men          Women1/2 Average Risk     3.4          3.3Average Risk          5.0          4.42X Average Risk          9.6          7.13X Average Risk          15.0          11.0                      . NonHDL 11/05/2014 122.20   Final   NOTE:  Non-HDL goal should be 30 mg/dL higher than patient's LDL goal (i.e. LDL goal of < 70 mg/dL, would have non-HDL goal of < 100 mg/dL)        Medication List       This list is accurate as of: 11/09/14  1:56 PM.  Always use your most recent med list.               aspirin 81 MG tablet  Take 81 mg by mouth daily.     busPIRone 15 MG tablet  Commonly known as:  BUSPAR  Take 1 tablet (15 mg total) by mouth at bedtime.     diclofenac sodium 1 % Gel  Commonly known as:  VOLTAREN  Apply 2 g topically 4 (four) times daily.     escitalopram 20 MG tablet  Commonly known as:  LEXAPRO  Take 20mg  po qD for 4 days then increase to 40mg  po qD. Pt is aware that dose is above FDA recommended and denies SE. She is willing to take the dose as prescribed.     esomeprazole 40 MG capsule  Commonly known as:  NEXIUM  Take 1 capsule (40 mg total) by mouth daily before breakfast.     fluocinonide  0.05 % external solution  Commonly known as:  LIDEX     glucagon 1 MG injection  Commonly known as:  GLUCAGON EMERGENCY  Inject 1 mg into the skin once as needed.     glucose blood test strip  Commonly known as:  ONE TOUCH ULTRA TEST  USE  TO CHECK BLOOD SUGAR FOUR TIMES DAILY     glycopyrrolate 1 MG tablet  Commonly known as:  ROBINUL  Take 1 tablet (1 mg total) by mouth 2 (two) times  daily.     ibuprofen 600 MG tablet  Commonly known as:  ADVIL,MOTRIN  Take 1 tablet (600 mg total) by mouth every 6 (six) hours as needed for pain.     insulin glargine 100 UNIT/ML injection  Commonly known as:  LANTUS  INJECT 7 UNITS INTO THE SKIN AT 11:00 PM AS INSTRUCTED (DISCARD VIAL 28 DAYS AFTER OPENING)     insulin lispro 100 UNIT/ML injection  Commonly known as:  HUMALOG  Sliding scale 10 units in am and 8 units at night     loratadine 10 MG tablet  Commonly known as:  CLARITIN  Take 10 mg by mouth as needed for allergies.     losartan 25 MG tablet  Commonly known as:  COZAAR  Take 1 tablet (25 mg total) by mouth daily.     lovastatin 40 MG tablet  Commonly known as:  MEVACOR  TAKE 1 TABLET BY MOUTH AT BEDTIME     multivitamin tablet  Take 1 tablet by mouth daily.     traMADol 50 MG tablet  Commonly known as:  ULTRAM  Take 1 tablet (50 mg total) by mouth every 12 (twelve) hours as needed for moderate pain or severe pain.     traZODone 100 MG tablet  Commonly known as:  DESYREL  Take 1-2 tablets (100-200 mg total) by mouth at bedtime.     vitamin A 10000 UNIT capsule  Take 10,000 Units by mouth daily.     vitamin B-12 1000 MCG tablet  Commonly known as:  CYANOCOBALAMIN  Take 1,000 mcg by mouth daily.     vitamin E 1000 UNIT capsule  Generic drug:  vitamin E  Take 1,000 Units by mouth daily.        Allergies:  Allergies  Allergen Reactions  . Penicillins   . Sulfonamide Derivatives     Past Medical History  Diagnosis Date  . HEPATITIS B   . BIPOLAR AFFECTIVE DISORDER   . SMOKER   . MIGRAINE HEADACHE   . NECK PAIN, CHRONIC   . LOW BACK PAIN, CHRONIC   . ALLERGIC RHINITIS   . HYPERLIPIDEMIA   . GERD   . DIABETES MELLITUS, TYPE II     follows with endo  . DEPRESSION   . NARCOTIC ABUSE     hx of  . Anxiety   . Arthritis   . Fibromyalgia   . Diabetes mellitus type I     Past Surgical History  Procedure Laterality Date  . Cesarean  section      Family History  Problem Relation Age of Onset  . Arthritis Mother   . Arthritis Father   . Kidney disease Father   . Kidney cancer Father   . Hyperlipidemia Other   . Kidney cancer Paternal Aunt   . Heart attack Brother   . Bladder Cancer Sister   . Anxiety disorder Neg Hx   . Bipolar disorder Neg Hx   . Depression Neg Hx  Social History:  reports that she has been smoking Cigarettes.  She has a 40 pack-year smoking history. She has never used smokeless tobacco. She reports that she drinks alcohol. She reports that she does not use illicit drugs.  Review of Systems:  DEPRESSION: She has had long-standing depression and anxiety previously followed by the mental health clinic She again complains of lack of motivation  HYPERLIPIDEMIA: The lipid abnormality consists of elevated LDL   Is  on lovastatin 40 mg and does not complain of any unusual muscle aches Not clear why her LDL is higher than before    Lab Results  Component Value Date   CHOL 199 11/05/2014   HDL 76.80 11/05/2014   LDLCALC 108* 11/05/2014   LDLDIRECT 101.7 03/26/2012   TRIG 69.0 11/05/2014   CHOLHDL 3 11/05/2014    She has been treated with low-dose Cozaar by PCP, no history of hypertension or microalbuminuria.   Has history of small goiter but no hypothyroidism  Lab Results  Component Value Date   TSH 0.78 04/27/2014        Examination:   BP 140/64 mmHg  Pulse 101  Temp(Src) 97.9 F (36.6 C)  Resp 16  Ht 5\' 4"  (1.626 m)  Wt 149 lb 12.8 oz (67.949 kg)  BMI 25.70 kg/m2  SpO2 96%  Body mass index is 25.7 kg/(m^2).    No pedal edema  ASSESSMENT/ PLAN:   Diabetes type 1 See history of present illness for detailed discussion of current insulin, blood sugar patterns, problems identified The patient's diabetes is relatively better with less fluctuation in her blood sugars However she is still having tendency to unexpected HYPOGLYCEMIA with readings as low as 28 Currently  has about 22% of her readings below target Some of her hypoglycemia  is when she wakes up but a few of them are related to taking Humalog postprandially  She does have less hypoglycemia at bedtime recently with trying to take her evening Lantus at bedtime Since she is having tendency to hypoglycemia at all different times including in the afternoon recently is likely getting excessive amounts of Lantus both morning and evening Again has difficulty with some meals, inconsistent snacks and generally compliance with diet Still does not appear to be requiring any Humalog for mealtime blood sugar control  Discussed again that she does need more diabetes education and would make her an appointment to see the nurse educator which she did not do on her last visit Also she can try and keep a journal of her blood sugars and food eaten for at least 3 days before going to the nurse educator   Since she is needing constant reminders about improving her day-to-day care she will also follow-up with nurse educator in about a month for reassessment.    She is concerned about her tendency to weight gain, however she does need to be more active and avoid hypoglycemia   Patient Instructions  Have a protein at bedtime with snack  Reduce Lantus to 9 in am and 6 at bedtime  Take Humalog only when eating excessive Carbs or high fat snacks, not AFTER the sugar goes up  Check sugar at 3 am weekly at least    Counseling time over 50% of today's 25 minute visit  Junius Faucett 11/09/2014, 1:56 PM   -

## 2014-11-23 ENCOUNTER — Encounter: Payer: Self-pay | Admitting: Nutrition

## 2014-12-01 ENCOUNTER — Encounter (HOSPITAL_COMMUNITY): Payer: Self-pay | Admitting: Psychiatry

## 2014-12-01 ENCOUNTER — Ambulatory Visit (INDEPENDENT_AMBULATORY_CARE_PROVIDER_SITE_OTHER): Payer: Commercial Managed Care - HMO | Admitting: Psychiatry

## 2014-12-01 VITALS — BP 140/80 | HR 85 | Ht 64.0 in | Wt 149.0 lb

## 2014-12-01 DIAGNOSIS — F4010 Social phobia, unspecified: Secondary | ICD-10-CM

## 2014-12-01 DIAGNOSIS — F17210 Nicotine dependence, cigarettes, uncomplicated: Secondary | ICD-10-CM

## 2014-12-01 DIAGNOSIS — F418 Other specified anxiety disorders: Secondary | ICD-10-CM

## 2014-12-01 DIAGNOSIS — F332 Major depressive disorder, recurrent severe without psychotic features: Secondary | ICD-10-CM | POA: Diagnosis not present

## 2014-12-01 MED ORDER — TRAZODONE HCL 100 MG PO TABS: 250.0000 mg | ORAL_TABLET | Freq: Every day | ORAL | Status: DC

## 2014-12-01 MED ORDER — LAMOTRIGINE 25 MG PO TABS: ORAL_TABLET | ORAL | Status: DC
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
## 2014-12-24 ENCOUNTER — Other Ambulatory Visit (HOSPITAL_COMMUNITY): Payer: Self-pay | Admitting: Psychiatry

## 2014-12-25 ENCOUNTER — Other Ambulatory Visit: Payer: Self-pay | Admitting: Endocrinology

## 2014-12-25 ENCOUNTER — Telehealth: Payer: Self-pay | Admitting: Internal Medicine

## 2014-12-25 NOTE — Telephone Encounter (Signed)
Patient needs a [employer] referral to Dr. Marthe Patch. She is an established patient with him

## 2014-12-31 NOTE — Telephone Encounter (Signed)
Pt aware.

## 2014-12-31 NOTE — Telephone Encounter (Signed)
[name]  #[id number] valid 12/31/2014 - 06/29/2015 for 6 visits

## 2015-01-12 DIAGNOSIS — L820 Inflamed seborrheic keratosis: Secondary | ICD-10-CM | POA: Diagnosis not present

## 2015-01-12 DIAGNOSIS — L281 Prurigo nodularis: Secondary | ICD-10-CM | POA: Diagnosis not present

## 2015-01-12 DIAGNOSIS — L700 Acne vulgaris: Secondary | ICD-10-CM | POA: Diagnosis not present

## 2015-01-23 ENCOUNTER — Other Ambulatory Visit: Payer: Self-pay | Admitting: Endocrinology

## 2015-01-23 ENCOUNTER — Other Ambulatory Visit (HOSPITAL_COMMUNITY): Payer: Self-pay | Admitting: Psychiatry

## 2015-01-26 ENCOUNTER — Other Ambulatory Visit: Payer: Self-pay | Admitting: Internal Medicine

## 2015-01-28 ENCOUNTER — Telehealth: Payer: Self-pay | Admitting: Endocrinology

## 2015-01-28 ENCOUNTER — Other Ambulatory Visit: Payer: Self-pay | Admitting: *Deleted

## 2015-01-28 MED ORDER — INSULIN LISPRO 100 UNIT/ML ~~LOC~~ SOLN: SUBCUTANEOUS | Status: DC

## 2015-01-28 NOTE — Telephone Encounter (Signed)
Patient called stating that she would like a refill on her Rx  Rx: Humalog   Pharmacy: [facility]   Thank you

## 2015-01-28 NOTE — Telephone Encounter (Signed)
rx sent

## 2015-02-01 ENCOUNTER — Ambulatory Visit (HOSPITAL_COMMUNITY): Payer: Self-pay | Admitting: Psychiatry

## 2015-02-05 ENCOUNTER — Other Ambulatory Visit (INDEPENDENT_AMBULATORY_CARE_PROVIDER_SITE_OTHER): Payer: Commercial Managed Care - HMO

## 2015-02-05 DIAGNOSIS — IMO0002 Reserved for concepts with insufficient information to code with codable children: Secondary | ICD-10-CM

## 2015-02-05 DIAGNOSIS — E1065 Type 1 diabetes mellitus with hyperglycemia: Secondary | ICD-10-CM

## 2015-02-05 LAB — BASIC METABOLIC PANEL
BUN: 11 mg/dL (ref 6–23)
CO2: 32 meq/L (ref 19–32)
Calcium: 9.1 mg/dL (ref 8.4–10.5)
Chloride: 102 mEq/L (ref 96–112)
Creatinine, Ser: 0.83 mg/dL (ref 0.40–1.20)
GFR: 74.33 mL/min (ref >60.00)
GLUCOSE: 110 mg/dL — AB (ref 70–99)
POTASSIUM: 4.7 meq/L (ref 3.5–5.1)
SODIUM: 138 meq/L (ref 135–145)

## 2015-02-05 LAB — MICROALBUMIN / CREATININE URINE RATIO
CREATININE, U: 65.8 mg/dL
Microalb Creat Ratio: 1.1 mg/g (ref 0.0–30.0)

## 2015-02-05 LAB — HEMOGLOBIN A1C: Hgb A1c MFr Bld: 7.9 % — ABNORMAL HIGH (ref 4.6–6.5)

## 2015-02-08 ENCOUNTER — Other Ambulatory Visit (HOSPITAL_COMMUNITY): Payer: Self-pay | Admitting: Psychiatry

## 2015-02-10 ENCOUNTER — Encounter: Payer: Self-pay | Admitting: Endocrinology

## 2015-02-10 ENCOUNTER — Ambulatory Visit (INDEPENDENT_AMBULATORY_CARE_PROVIDER_SITE_OTHER): Payer: Commercial Managed Care - HMO | Admitting: Endocrinology

## 2015-02-10 VITALS — BP 118/62 | HR 90 | Temp 98.1°F | Resp 14 | Ht 64.0 in | Wt 148.2 lb

## 2015-02-10 DIAGNOSIS — E785 Hyperlipidemia, unspecified: Secondary | ICD-10-CM

## 2015-02-10 DIAGNOSIS — IMO0002 Reserved for concepts with insufficient information to code with codable children: Secondary | ICD-10-CM

## 2015-02-10 DIAGNOSIS — E1065 Type 1 diabetes mellitus with hyperglycemia: Secondary | ICD-10-CM

## 2015-02-10 NOTE — Progress Notes (Signed)
Patient ID: Leslie Lane, female   DOB: 04/14/1956, 59 y.o.   MRN: [id number]   Reason for Appointment: Diabetes follow-up   History of Present Illness   Diagnosis: Type 1 DIABETES MELITUS, diagnosed 1967     Insulin regimen: Lantus 10 units a.m., 6 units h.s. Humalog 1-2 p.r.n. With vial  She has had labile blood sugar control over the last several years even though A1c has been usually around 7% She has had less lability and hypoglycemia with taking b.i.d. Lantus compared once a day She has been very sensitive to fast acting insulin and frequently does not require mealtime coverage  RECENT history: Her A1c is slightly higher than before and has been consistently over 7% She was told to take only 6 units of Lantus in the evenings and with this she has not had as much hypoglycemia and the mornings   She is still using minimal amounts of Humalog and usually only for correcting significantly high readings Current blood sugar patterns and problems identified:  She does not take Humalog with mealtimes or large snacks even when she is eating junk food and knows that the sugar will go up  She is avoiding taking Humalog at bedtime when her sugars may be significantly high to reduce the chances of nocturnal hypoglycemia  She still has significant hypoglycemia unawareness and probably cannot detect hypoglycemia on her own usually  Generally takes 1-2 units of Humalog with good results and does not overcorrect high readings usually  Hypoglycemia: She had low readings on 3 days last week, mostly in the mornings and on 1 day had persistent hyperglycemia until late afternoon  She has sporadic high readings at all different times with overall highest reading late at night which she thinks is from eating excessive snacks after supper   Fasting blood sugars are quite variable but on an average relatively low  She was told to keep a record of her food eaten and discuss day-to-day  management with nurse educator but she did not do so  HYPOGLYCEMIA: As above, may not have symptoms with low blood sugars and does not have early warning symptoms. Gets confused frequently and she depends on her mother to recognize low sugars. Her mother knows how to give Glucagon injection She will treat her low blood sugars usually with juice, does not like glucose tablets; sometimes will eat food at the same time      DIET: She is usually not eating balanced meals although trying to get protein with eggs in the morning now sometimes Meal times: dinner 5-6 p.m. Has high-fat snacks like chips, ice cream and popcorn at various times.  She will eat more when she is anxious.  Monitors blood glucose:  about 4-5 times a day.    Glucometer: One Touch.      Blood Glucose readings from meter download:   Mean values apply above for all meters except median for One Touch  PRE-MEAL Fasting Lunch Dinner Bedtime Overall  Glucose range:  40-302   60-222   73-357   76-399    Mean/median:  110   152  196  213   162   She thinks she is gaining weight but it appears to be stable.  Wt Readings from Last 3 Encounters:  02/10/15 148 lb 3.2 oz (67.223 kg)  12/01/14 149 lb (67.586 kg)  11/09/14 149 lb 12.8 oz (67.949 kg)    Lab Results  Component Value Date   HGBA1C 7.9* 02/05/2015  HGBA1C 7.4* 11/05/2014   HGBA1C 7.5* 08/06/2014   Lab Results  Component Value Date   MICROALBUR <0.7 02/05/2015   LDLCALC 108* 11/05/2014   CREATININE 0.83 02/05/2015    Appointment on 02/05/2015  Component Date Value Ref Range Status  . Hgb A1c MFr Bld 02/05/2015 7.9* 4.6 - 6.5 % Final   Glycemic Control Guidelines for People with Diabetes:Non Diabetic:  <6%Goal of Therapy: <7%Additional Action Suggested:  >8%   . Sodium 02/05/2015 138  135 - 145 mEq/L Final  . Potassium 02/05/2015 4.7  3.5 - 5.1 mEq/L Final  . Chloride 02/05/2015 102  96 - 112 mEq/L Final  . CO2 02/05/2015 32  19 - 32 mEq/L Final  . Glucose,  Bld 02/05/2015 110* 70 - 99 mg/dL Final  . BUN 02/05/2015 11  6 - 23 mg/dL Final  . Creatinine, Ser 02/05/2015 0.83  0.40 - 1.20 mg/dL Final  . Calcium 02/05/2015 9.1  8.4 - 10.5 mg/dL Final  . GFR 02/05/2015 74.33  >60.00 mL/min Final  . Microalb, Ur 02/05/2015 <0.7  0.0 - 1.9 mg/dL Final  . Creatinine,U 02/05/2015 65.8   Final  . Microalb Creat Ratio 02/05/2015 1.1  0.0 - 30.0 mg/g Final        Medication List       This list is accurate as of: 02/10/15  9:32 AM.  Always use your most recent med list.               aspirin 81 MG tablet  Take 81 mg by mouth daily.     BIOFLEX PO  Take by mouth.     diclofenac sodium 1 % Gel  Commonly known as:  VOLTAREN  Apply 2 g topically 4 (four) times daily.     esomeprazole 40 MG capsule  Commonly known as:  NEXIUM  Take 1 capsule (40 mg total) by mouth daily before breakfast.     fluocinonide 0.05 % external solution  Commonly known as:  LIDEX     GLUCAGON EMERGENCY 1 MG injection  Generic drug:  glucagon  INJECT 1 MG INTO THE SKIN AT ONCE AS NEEDED     glucose blood test strip  Commonly known as:  ONE TOUCH ULTRA TEST  USE  TO CHECK BLOOD SUGAR FOUR TIMES DAILY     glycopyrrolate 1 MG tablet  Commonly known as:  ROBINUL  Take 1 tablet (1 mg total) by mouth 2 (two) times daily.     ibuprofen 600 MG tablet  Commonly known as:  ADVIL,MOTRIN  Take 1 tablet (600 mg total) by mouth every 6 (six) hours as needed for pain.     insulin lispro 100 UNIT/ML injection  Commonly known as:  HUMALOG  Sliding scale 10 units in am and 8 units at night     lamoTRIgine 25 MG tablet  Commonly known as:  LAMICTAL  Take 25mg  po qD for 14 days then increase to 50mg  po qD     LANTUS 100 UNIT/ML injection  Generic drug:  insulin glargine  INJECT 9 UNITS INTO THE SKIN IN THE MORNING AS INSTRUCTED (DISCARD VIAL 28 DAYS AFTER OPENING)     loratadine 10 MG tablet  Commonly known as:  CLARITIN  Take 10 mg by mouth as needed for allergies.      lovastatin 40 MG tablet  Commonly known as:  MEVACOR  TAKE 1 TABLET BY MOUTH AT BEDTIME     multivitamin tablet  Take 1 tablet by mouth daily.  traMADol 50 MG tablet  Commonly known as:  ULTRAM  Take 1 tablet (50 mg total) by mouth every 12 (twelve) hours as needed for moderate pain or severe pain.     traZODone 100 MG tablet  Commonly known as:  DESYREL  Take 2.5 tablets (250 mg total) by mouth at bedtime.     vitamin A 10000 UNIT capsule  Take 10,000 Units by mouth daily.     vitamin B-12 1000 MCG tablet  Commonly known as:  CYANOCOBALAMIN  Take 1,000 mcg by mouth daily.     vitamin E 1000 UNIT capsule  Generic drug:  vitamin E  Take 1,000 Units by mouth daily.        Allergies:  Allergies  Allergen Reactions  . Penicillins   . Sulfonamide Derivatives     Past Medical History  Diagnosis Date  . HEPATITIS B   . BIPOLAR AFFECTIVE DISORDER   . SMOKER   . MIGRAINE HEADACHE   . NECK PAIN, CHRONIC   . LOW BACK PAIN, CHRONIC   . ALLERGIC RHINITIS   . HYPERLIPIDEMIA   . GERD   . DIABETES MELLITUS, TYPE II     follows with endo  . DEPRESSION   . NARCOTIC ABUSE     hx of  . Anxiety   . Arthritis   . Fibromyalgia   . Diabetes mellitus type I     Past Surgical History  Procedure Laterality Date  . Cesarean section      Family History  Problem Relation Age of Onset  . Arthritis Mother   . Arthritis Father   . Kidney disease Father   . Kidney cancer Father   . Hyperlipidemia Other   . Kidney cancer Paternal Aunt   . Heart attack Brother   . Bladder Cancer Sister   . Anxiety disorder Neg Hx   . Bipolar disorder Neg Hx   . Depression Neg Hx     Social History:  reports that she has been smoking Cigarettes.  She has a 40 pack-year smoking history. She has never used smokeless tobacco. She reports that she drinks alcohol. She reports that she does not use illicit drugs.  Review of Systems:  DEPRESSION: She has had long-standing depression and  anxiety previously followed by the behavioral medicine She again complains of some depression, insomnia and anxiety  HYPERLIPIDEMIA: The lipid abnormality consists of elevated LDL   Is  on lovastatin 40 mg and does not complain of any unusual muscle aches Her LDL has been mildly increased.   Lab Results  Component Value Date   CHOL 199 11/05/2014   HDL 76.80 11/05/2014   LDLCALC 108* 11/05/2014   LDLDIRECT 101.7 03/26/2012   TRIG 69.0 11/05/2014   CHOLHDL 3 11/05/2014    She has been treated with low-dose Cozaar by PCP, no history of hypertension or microalbuminuria.  Currently not taking this.  Has history of small goiter but no hypothyroidism  Lab Results  Component Value Date   TSH 0.78 04/27/2014      She complains about decreased memory.  Has not discussed with PCP She tries to take B-12 101 but does not think it helps   Examination:   BP 118/62 mmHg  Pulse 90  Temp(Src) 98.1 F (36.7 C)  Resp 14  Ht 5\' 4"  (1.626 m)  Wt 148 lb 3.2 oz (67.223 kg)  BMI 25.43 kg/m2  SpO2 95%  Body mass index is 25.43 kg/(m^2).     ASSESSMENT/ PLAN:  Diabetes type 1 See history of present illness for detailed discussion of current insulin, blood sugar patterns, problems identified The patient's diabetes is relatively better with less hypoglycemia She continues to have fluctuation of blood sugars at all times with standard deviation 81 However she is still having tendency to unexpected HYPOGLYCEMIA on a few days although less than before Sitting most of her high readings are from snacking excessively and overeating She does not take extra Humalog when she is eating larger meals or snacks  Currently has about 22% of her readings below target Some of her hypoglycemia  is when she wakes up but a few of them are related to taking Humalog postprandially Again has difficulty with some meals, inconsistent snacks and generally compliance with diet Still does not appear to be  requiring any Humalog for mealtime blood sugar control if she is having small balanced meals  Discussed again that she does need more diabetes education and would make her an appointment to see the nurse educator which she did not do on her last visit.  Given blood sugar diary to keep a record of her food eaten and blood sugars  For now we'll continue same doses of Lantus  Other medical problems: She will discuss with PCP including issues with decreased memory, B-12 deficiency  She will have lipids checked on her next visit  Counseling time on subjects discussed above is over 50% of today's 25 minute visit  Patient Instructions  Review diet with Dietician after keeping record  Take 1-2 units Humalog when eating large amounts of Carbs/hi fat snacks        Darvis Croft 02/10/2015, 9:32 AM   -

## 2015-02-10 NOTE — Patient Instructions (Addendum)
Review diet with Dietician after keeping record  Take 1-2 units Humalog when eating large amounts of Carbs/hi fat snacks

## 2015-02-12 ENCOUNTER — Ambulatory Visit: Payer: Self-pay | Admitting: Internal Medicine

## 2015-02-15 NOTE — Telephone Encounter (Signed)
Telephone call with patient to question if out of Lamictal 25mg  tablets as states she has plenty until she returns to see Dr. Adele Schilder on 02/18/15 while Dr. Doyne Keel is out on maternity leave.  Patient reports the medication has helped but admitted she had forgotten to increase to 2 a day so has been taking one a day since was seen 12/01/14.  Patient stated she thought she might need a little more so will begin taking the 2 a day as prescribed and will keep appointment with Dr. Adele Schilder on 02/18/15 to inform how she is doing at that time.  Patient to report any problems with medication or side effects; skin rash.

## 2015-02-18 ENCOUNTER — Ambulatory Visit (INDEPENDENT_AMBULATORY_CARE_PROVIDER_SITE_OTHER): Payer: Commercial Managed Care - HMO | Admitting: Psychiatry

## 2015-02-18 ENCOUNTER — Encounter (HOSPITAL_COMMUNITY): Payer: Self-pay | Admitting: Psychiatry

## 2015-02-18 VITALS — BP 124/65 | HR 89 | Ht 64.25 in | Wt 149.0 lb

## 2015-02-18 DIAGNOSIS — F332 Major depressive disorder, recurrent severe without psychotic features: Secondary | ICD-10-CM

## 2015-02-18 DIAGNOSIS — G4700 Insomnia, unspecified: Secondary | ICD-10-CM

## 2015-02-18 MED ORDER — TRAZODONE HCL 300 MG PO TABS: 300.0000 mg | ORAL_TABLET | Freq: Every day | ORAL | Status: DC

## 2015-02-18 MED ORDER — LAMOTRIGINE 25 MG PO TABS: ORAL_TABLET | ORAL | Status: DC
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
## 2015-02-19 ENCOUNTER — Other Ambulatory Visit (HOSPITAL_COMMUNITY): Payer: Self-pay | Admitting: Psychiatry

## 2015-03-02 ENCOUNTER — Ambulatory Visit (INDEPENDENT_AMBULATORY_CARE_PROVIDER_SITE_OTHER): Payer: Commercial Managed Care - HMO | Admitting: Internal Medicine

## 2015-03-02 ENCOUNTER — Encounter: Payer: Self-pay | Admitting: Internal Medicine

## 2015-03-02 VITALS — BP 130/78 | HR 85 | Temp 98.5°F | Resp 14 | Ht 64.25 in | Wt 149.0 lb

## 2015-03-02 DIAGNOSIS — K219 Gastro-esophageal reflux disease without esophagitis: Secondary | ICD-10-CM

## 2015-03-02 DIAGNOSIS — E1065 Type 1 diabetes mellitus with hyperglycemia: Secondary | ICD-10-CM | POA: Diagnosis not present

## 2015-03-02 DIAGNOSIS — IMO0002 Reserved for concepts with insufficient information to code with codable children: Secondary | ICD-10-CM

## 2015-03-02 MED ORDER — OMEPRAZOLE 40 MG PO CPDR: 40.0000 mg | DELAYED_RELEASE_CAPSULE | Freq: Two times a day (BID) | ORAL | Status: DC

## 2015-03-02 NOTE — Assessment & Plan Note (Signed)
Severe and limiting her eating (weight down slightly). Will order omeprazole 40 mg BID for her symptoms. If no improvement needs to see GI. Other concern is that the pain is related to her poorly controlled diabetes and could represent gastroparesis.

## 2015-03-02 NOTE — Progress Notes (Signed)
   Subjective:    Patient ID: JAANA BRODT, female    DOB: 01-23-1956, 59 y.o.   MRN: [id number]  HPI The patient is a 59 YO female coming in for GERD and stomach pain. She previously was on nexium which was helpful. However her insurance company would not cover that. So she has been buying it over the counter (1/2 dose) and it is not helping as much. She is getting pains in the evening. She has been troubled by this for many years. Is affecting her diet due to pain.   Review of Systems  Constitutional: Negative for fever, activity change, appetite change, fatigue and unexpected weight change.  Respiratory: Negative.   Cardiovascular: Negative.   Gastrointestinal: Positive for nausea and abdominal pain. Negative for diarrhea, constipation, blood in stool and abdominal distention.  Musculoskeletal: Negative.   Neurological: Negative for dizziness, syncope, weakness, numbness and headaches.       Burning in feet      Objective:   Physical Exam  Constitutional: She is oriented to person, place, and time. She appears well-developed and well-nourished.  HENT:  Head: Normocephalic and atraumatic.  Cardiovascular: Normal rate.   Pulmonary/Chest: Effort normal and breath sounds normal.  Abdominal: Soft.  Musculoskeletal: She exhibits no edema.  Neurological: She is alert and oriented to person, place, and time. Coordination normal.  Skin: Skin is warm and dry.   Filed Vitals:   03/02/15 1030  BP: 130/78  Pulse: 85  Temp: 98.5 F (36.9 C)  TempSrc: Oral  Resp: 14  Height: 5' 4.25" (1.632 m)  Weight: 149 lb (67.586 kg)  SpO2: 96%      Assessment & Plan:

## 2015-03-02 NOTE — Patient Instructions (Signed)
We have sent in a stomach medicine called omeprazole (prilosec) that is the high dose. You will take it before breakfast and before your evening meal to help with the stomach pain. Give it about 1-2 weeks to help out and if you are still having problems please call us back.  If you are doing well come back in about 6 months for a physical.   Diabetes and Exercise Exercising regularly is important. It is not just about losing weight. It has many health benefits, such as:  Improving your overall fitness, flexibility, and endurance.  Increasing your bone density.  Helping with weight control.  Decreasing your body fat.  Increasing your muscle strength.  Reducing stress and tension.  Improving your overall health. People with diabetes who exercise gain additional benefits because exercise:  Reduces appetite.  Improves the body's use of blood sugar (glucose).  Helps lower or control blood glucose.  Decreases blood pressure.  Helps control blood lipids (such as cholesterol and triglycerides).  Improves the body's use of the hormone insulin by:  Increasing the body's insulin sensitivity.  Reducing the body's insulin needs.  Decreases the risk for heart disease because exercising:  Lowers cholesterol and triglycerides levels.  Increases the levels of good cholesterol (such as high-density lipoproteins [HDL]) in the body.  Lowers blood glucose levels. YOUR ACTIVITY PLAN  Choose an activity that you enjoy and set realistic goals. Your health care provider or diabetes educator can help you make an activity plan that works for you. Exercise regularly as directed by your health care provider. This includes:  Performing resistance training twice a week such as push-ups, sit-ups, lifting weights, or using resistance bands.  Performing 150 minutes of cardio exercises each week such as walking, running, or playing sports.  Staying active and spending no more than 90 minutes at one  time being inactive. Even short bursts of exercise are good for you. Three 10-minute sessions spread throughout the day are just as beneficial as a single 30-minute session. Some exercise ideas include:  Taking the dog for a walk.  Taking the stairs instead of the elevator.  Dancing to your favorite song.  Doing an exercise video.  Doing your favorite exercise with a friend. RECOMMENDATIONS FOR EXERCISING WITH TYPE 1 OR TYPE 2 DIABETES   Check your blood glucose before exercising. If blood glucose levels are greater than 240 mg/dL, check for urine ketones. Do not exercise if ketones are present.  Avoid injecting insulin into areas of the body that are going to be exercised. For example, avoid injecting insulin into:  The arms when playing tennis.  The legs when jogging.  Keep a record of:  Food intake before and after you exercise.  Expected peak times of insulin action.  Blood glucose levels before and after you exercise.  The type and amount of exercise you have done.  Review your records with your health care provider. Your health care provider will help you to develop guidelines for adjusting food intake and insulin amounts before and after exercising.  If you take insulin or oral hypoglycemic agents, watch for signs and symptoms of hypoglycemia. They include:  Dizziness.  Shaking.  Sweating.  Chills.  Confusion.  Drink plenty of water while you exercise to prevent dehydration or heat stroke. Body water is lost during exercise and must be replaced.  Talk to your health care provider before starting an exercise program to make sure it is safe for you. Remember, almost any type of activity is  better than none. Document Released: 09/09/2003 Document Revised: 11/03/2013 Document Reviewed: 11/26/2012 [facility] Patient Information 2015 [place], [place]. This information is not intended to replace advice given to you by your health care provider. Make sure you discuss any  questions you have with your health care provider.

## 2015-03-02 NOTE — Assessment & Plan Note (Signed)
Concern that her new stomach pains represent new complication of her diabetes. If she is not improving on PPI would consider gastric emptying study.

## 2015-03-02 NOTE — Progress Notes (Signed)
Pre visit review using our clinic review tool, if applicable. No additional management support is needed unless otherwise documented below in the visit note. 

## 2015-03-04 ENCOUNTER — Telehealth: Payer: Self-pay | Admitting: *Deleted

## 2015-03-04 NOTE — Telephone Encounter (Signed)
Pt called and stated that md was going to send something to pharmacy to help with her stomach pain. Inform pt per chart md sent the omeprazole 40 mg she states this will help with pain. Requesting pt to take at 2 weeks if no better will order gastric emptying test.../lmb

## 2015-03-09 ENCOUNTER — Encounter: Payer: Self-pay | Admitting: Internal Medicine

## 2015-03-09 ENCOUNTER — Ambulatory Visit (INDEPENDENT_AMBULATORY_CARE_PROVIDER_SITE_OTHER): Payer: Commercial Managed Care - HMO | Admitting: Internal Medicine

## 2015-03-09 VITALS — BP 108/60 | HR 78 | Temp 98.2°F | Resp 16 | Wt 148.0 lb

## 2015-03-09 DIAGNOSIS — J441 Chronic obstructive pulmonary disease with (acute) exacerbation: Secondary | ICD-10-CM | POA: Diagnosis not present

## 2015-03-09 MED ORDER — BENZONATATE 100 MG PO CAPS: 200.0000 mg | ORAL_CAPSULE | Freq: Three times a day (TID) | ORAL | Status: DC | PRN

## 2015-03-09 MED ORDER — DOXYCYCLINE HYCLATE 100 MG PO TABS: 100.0000 mg | ORAL_TABLET | Freq: Two times a day (BID) | ORAL | Status: DC

## 2015-03-09 NOTE — Progress Notes (Signed)
Pre visit review using our clinic review tool, if applicable. No additional management support is needed unless otherwise documented below in the visit note. 

## 2015-03-09 NOTE — Progress Notes (Signed)
   Subjective:    Patient ID: Leslie Lane, female    DOB: [birth year], 59 y.o.   MRN: [id number]  HPI  The last week she's developed a nonproductive cough which occurs paroxysmally every 30-60 minutes during  the night  disturbing her sleep. This resulted in malaise and lack of energy. She also noted itchy, watery eyes, postnasal drainage, and sore throat. She has some discomfort in both temples with the cough. All sputum is clear.   She is a diabetic on insulin. She states her sugars can vary from the 40s to 280. She said at one time her glucose was 11 in the context of diabetic coma. She sees an Musician.   Review of Systems  She denies frontal or maxillary sinus pain; nasal purulence; otic pain, otic discharge.     Objective:   Physical Exam  Pertinent or positive findings include: She is markedly hoarse. She has wax in both otic canals. She has erythema of the nares and oropharynx. Breath sounds are decreased. Distant S4 is noted.  General appearance:Adequately nourished; no acute distress or increased work of breathing is present.    Lymphatic: No  lymphadenopathy about the head, neck, or axilla .  Eyes: No conjunctival inflammation or lid edema is present. There is no scleral icterus.  Ears:  External ear exam shows no significant lesions or deformities.    Nose:  External nasal examination shows no deformity or inflammation.No septal dislocation or deviation.No obstruction to airflow.   Oral exam: Dental hygiene is good; lips and gums are healthy appearing.There is no oropharyngeal  exudate .  Neck:  No deformities, thyromegaly, masses, or tenderness noted.      Heart:  Normal rate and regular rhythm. S1 and S2 normal without gallop, murmur, click, or rub.   Lungs:Chest clear to auscultation; no wheezes, rhonchi,rales ,or rubs present.  Extremities:  No cyanosis, edema, or clubbing  noted    Skin: Warm & dry w/o tenting or jaundice. No significant lesions or  rash.     Assessment & Plan:  #1 COPD with acute exacerbation  #2 active smoker with no inclination to discontinue smoking.  E cigarette declined. She stated "it might catch fire in my face"  See orders and recommendations

## 2015-03-09 NOTE — Patient Instructions (Addendum)
Incruse one inhalation daily; gargle and spit after use. Lot #:[id number] Exp 03/17  Please consider using the E cigarette as we discussed. This could possibly decrease inflammation of the airways.  Plain Mucinex (NOT D) for thick secretions ;force NON dairy fluids .   Nasal cleansing in the shower as discussed with lather of mild shampoo.After 10 seconds wash off lather while  exhaling through nostrils. Make sure that all residual soap is removed to prevent irritation.  Flonase OR Nasacort AQ 1 spray in each nostril twice a day as needed. Use the "crossover" technique into opposite nostril spraying toward opposite ear @ 45 degree angle, not straight up into nostril.  Plain Allegra (NOT D )  160 daily , Loratidine 10 mg , OR Zyrtec 10 mg @ bedtime  as needed for itchy eyes & sneezing.

## 2015-03-23 ENCOUNTER — Ambulatory Visit (INDEPENDENT_AMBULATORY_CARE_PROVIDER_SITE_OTHER): Payer: Commercial Managed Care - HMO | Admitting: Internal Medicine

## 2015-03-23 ENCOUNTER — Encounter: Payer: Self-pay | Admitting: Internal Medicine

## 2015-03-23 VITALS — BP 108/58 | HR 75 | Temp 98.1°F | Resp 16 | Ht 64.0 in | Wt 148.0 lb

## 2015-03-23 DIAGNOSIS — H6123 Impacted cerumen, bilateral: Secondary | ICD-10-CM | POA: Diagnosis not present

## 2015-03-23 DIAGNOSIS — L989 Disorder of the skin and subcutaneous tissue, unspecified: Secondary | ICD-10-CM | POA: Diagnosis not present

## 2015-03-23 DIAGNOSIS — H6120 Impacted cerumen, unspecified ear: Secondary | ICD-10-CM | POA: Insufficient documentation

## 2015-03-23 DIAGNOSIS — H918X3 Other specified hearing loss, bilateral: Secondary | ICD-10-CM | POA: Diagnosis not present

## 2015-03-23 DIAGNOSIS — R21 Rash and other nonspecific skin eruption: Secondary | ICD-10-CM | POA: Insufficient documentation

## 2015-03-23 MED ORDER — NEOMYCIN-POLYMYXIN-DEXAMETH 3.5-10000-0.1 OP SUSP: 2.0000 [drp] | Freq: Three times a day (TID) | OPHTHALMIC | Status: DC

## 2015-03-23 NOTE — Assessment & Plan Note (Signed)
Referral to dermatology for scalp lesion which has partially responded to betamethasone cream.

## 2015-03-23 NOTE — Assessment & Plan Note (Signed)
Will rx corticosporin drops for the ear for the itching and referral to ENT for disimpaction (per patient preference). Advised not to use any sharp implements on her ear for scratching or to try to remove the wax on her own.

## 2015-03-23 NOTE — Patient Instructions (Signed)
We will get you in with the ENT and the dermatologist to work on the ears. We have sent in some drops for the ear (they will say for eye but are for the ear as well). Use 2 drops in the ears 3 times a day for 1-2 weeks. This will help the itching.   Do not use anything in your ears such as q-tips or hairpins as these can damage the ear canal.

## 2015-03-23 NOTE — Progress Notes (Signed)
Pre visit review using our clinic review tool, if applicable. No additional management support is needed unless otherwise documented below in the visit note. 

## 2015-03-23 NOTE — Progress Notes (Signed)
   Subjective:    Patient ID: Leslie Lane, female    DOB: Oct 12, 1955, 59 y.o.   MRN: [id number]  HPI The patient is a 59 YO female coming in for decreased hearing and itching ears. She states her mother looked at them this weekend and told her they were full of wax. She is having bad itching and trying not to scratch with something. No fevers or chills. No SOB or chest pains. No nasal congestion. Has not tried anything for it.   Review of Systems  Constitutional: Negative for fever, activity change, appetite change, fatigue and unexpected weight change.  HENT: Positive for ear discharge and ear pain. Negative for congestion, facial swelling, mouth sores, nosebleeds, postnasal drip, rhinorrhea, sinus pressure, sore throat and trouble swallowing.   Eyes: Negative.   Respiratory: Negative.   Cardiovascular: Negative.   Gastrointestinal: Negative for nausea, abdominal pain, diarrhea, constipation, blood in stool and abdominal distention.  Musculoskeletal: Negative.   Neurological: Negative for dizziness, syncope, weakness, numbness and headaches.       Burning in feet      Objective:   Physical Exam  Constitutional: She is oriented to person, place, and time. She appears well-developed and well-nourished.  HENT:  Head: Normocephalic and atraumatic.  Nose: Nose normal.  Mouth/Throat: Oropharynx is clear and moist.  Right and left ear impacted with cerumen, she did not want disimpaction today  Cardiovascular: Normal rate.   Pulmonary/Chest: Effort normal and breath sounds normal.  Abdominal: Soft.  Musculoskeletal: She exhibits no edema.  Neurological: She is alert and oriented to person, place, and time. Coordination normal.  Skin: Skin is warm and dry.   Filed Vitals:   03/23/15 0829  BP: 108/58  Pulse: 75  Temp: 98.1 F (36.7 C)  TempSrc: Oral  Resp: 16  Height: 5\' 4"  (1.626 m)  Weight: 148 lb (67.132 kg)  SpO2: 98%      Assessment & Plan:

## 2015-03-24 ENCOUNTER — Encounter: Payer: Self-pay | Admitting: Nutrition

## 2015-03-25 ENCOUNTER — Encounter: Payer: Commercial Managed Care - HMO | Attending: Endocrinology | Admitting: Dietician

## 2015-03-25 ENCOUNTER — Encounter: Payer: Self-pay | Admitting: Dietician

## 2015-03-25 VITALS — Ht 64.5 in | Wt 149.0 lb

## 2015-03-25 DIAGNOSIS — IMO0002 Reserved for concepts with insufficient information to code with codable children: Secondary | ICD-10-CM

## 2015-03-25 DIAGNOSIS — E1065 Type 1 diabetes mellitus with hyperglycemia: Secondary | ICD-10-CM | POA: Insufficient documentation

## 2015-03-25 DIAGNOSIS — Z713 Dietary counseling and surveillance: Secondary | ICD-10-CM | POA: Diagnosis not present

## 2015-03-25 NOTE — Progress Notes (Signed)
Diabetes Self-Management Education  Visit Type: First/Initial  Appt. Start Time: 1045 Appt. End Time: 1200  03/25/2015  [name], identified by name and date of birth, is a 59 y.o. female with a diagnosis of Diabetes: Type 1.   Patient is here alone.  She has had type 1 diabetes since age 71.  She lives with her parents as she was told that she cannot live alone.  Mother shops.  Patient does the cooking.  She is on disability because of her memory.  Her fasting blood sugar in the am is 40-302,  Before Lunch 60-222, before Dinner 73-357, HS 76-399. Her blood sugar was 32 this am.  She cannot remember what she ate last night.  Her meal schedule is irregular and she will snack rather than eat a meal at times.  She reports gaining weight from 130 lbs.  She is trying to quit smoking and is using e-cigarettes.  She has fibromyalgia and GERD.  There is concern about gastroparesis but patient has not had the tests for this.  She complains of chronic pain and nausea and states that she takes her fathers nausea medication and her mom's gabapentin.  She is supposed to [facility] her friends [facility].  She usually does not exercise.  She would qualify for the silver sneakers program.    Breakfast:  Cheese toast and 1/2 apple Lunch:  Spinach salad with roast beef and 2 scoops of sugar free ice cream with peanut butter OR skips and just snacks. Snacks:  Popcorn or baked chips Dinner:  Sandwich with meat and cheese and 1/4 banana Snacks:  Sugar free Cookies or candy, chips or popcorn  ASSESSMENT  Height 5' 4.5" (1.638 m), weight 149 lb (67.586 kg). Body mass index is 25.19 kg/(m^2).      Diabetes Self-Management Education - 03/25/15 1102    Visit Information   Visit Type First/Initial   Initial Visit   Diabetes Type Type 1   Are you currently following a meal plan? No   Are you taking your medications as prescribed? Yes   Date Diagnosed 59 yo   Health Coping   How would you rate your  overall health? Fair   Psychosocial Assessment   Patient Belief/Attitude about Diabetes Defeat/Burnout   Other persons present Patient      Individualized Plan for Diabetes Self-Management Training:   Learning Objective:  Patient will have a greater understanding of diabetes self-management. Patient education plan is to attend individual and/or group sessions per assessed needs and concerns.   Plan:   Patient Instructions  Consider the Silver Sneakers program at the [facility]. Have consistent meals.  Try to eat at the same time every day.  Do not skip meals.   Aim for 2 carb choices at each meal (30 grams) Limit snacks. Limit sweets.    Protein in small amounts with all meals and snacks. Consider the Type 1 Diabetes Support Group:  First Thursday of each month from 6-7 pm [place], [address], [facility].  Call prior to coming to be sure of meeting time and place.  [phone].  Bake rather than fry  Expected Outcomes:   Patient expressed interest but question compliance or ability to make changes based on her memory problems.  Education material provided: Food label handouts, Meal plan card, My Plate and Snack sheet, breakfast, Build a better meal  If problems or questions, patient to contact team via:  Phone and Email  Future DSME appointment:  2 months

## 2015-03-25 NOTE — Patient Instructions (Signed)
Consider the [employer] program at the [facility]. Have consistent meals.  Try to eat at the same time every day.  Do not skip meals.   Aim for 2 carb choices at each meal (30 grams) Limit snacks. Limit sweets.    Protein in small amounts with all meals and snacks. Consider the Type 1 Diabetes Support Group:  First Thursday of each month from 6-7 pm [place], [address], [facility].  Call prior to coming to be sure of meeting time and place.  [phone].

## 2015-03-29 DIAGNOSIS — H9193 Unspecified hearing loss, bilateral: Secondary | ICD-10-CM | POA: Diagnosis not present

## 2015-03-29 DIAGNOSIS — J301 Allergic rhinitis due to pollen: Secondary | ICD-10-CM | POA: Diagnosis not present

## 2015-03-29 DIAGNOSIS — H6123 Impacted cerumen, bilateral: Secondary | ICD-10-CM | POA: Diagnosis not present

## 2015-03-29 DIAGNOSIS — Z720 Tobacco use: Secondary | ICD-10-CM | POA: Diagnosis not present

## 2015-03-29 DIAGNOSIS — H938X3 Other specified disorders of ear, bilateral: Secondary | ICD-10-CM | POA: Diagnosis not present

## 2015-03-30 ENCOUNTER — Telehealth: Payer: Self-pay | Admitting: Internal Medicine

## 2015-03-30 ENCOUNTER — Other Ambulatory Visit: Payer: Self-pay | Admitting: Endocrinology

## 2015-03-30 NOTE — Telephone Encounter (Signed)
Left message for patient to call me back. 

## 2015-03-30 NOTE — Telephone Encounter (Signed)
Pt called in and said that the meds that she is taking is not helping her.  She said that Dr Sharlet Salina, told her to call back in if it wasn't working.  Can you call her   Best number [phone]

## 2015-03-31 MED ORDER — RABEPRAZOLE SODIUM 20 MG PO TBEC: 20.0000 mg | DELAYED_RELEASE_TABLET | Freq: Every day | ORAL | Status: DC

## 2015-03-31 NOTE — Telephone Encounter (Signed)
Have sent in aciphex which is another similar medicine. Stop taking omeprazole and start taking the aciphex. If no improvement in 1-2 weeks call back and she may need to see GI.

## 2015-03-31 NOTE — Telephone Encounter (Signed)
Pt has called you back Please call her

## 2015-03-31 NOTE — Telephone Encounter (Signed)
Patient says she has been taking omeprazole for 2 weeks now and its not helping. She wants something else. Please advise, thanks.

## 2015-04-01 NOTE — Telephone Encounter (Signed)
Patient aware and will stop omeprazole and start aciphex. She will call us if she isn't feeling better in a couple of weeks.

## 2015-04-13 ENCOUNTER — Ambulatory Visit (INDEPENDENT_AMBULATORY_CARE_PROVIDER_SITE_OTHER): Payer: Commercial Managed Care - HMO | Admitting: Psychiatry

## 2015-04-13 ENCOUNTER — Encounter (HOSPITAL_COMMUNITY): Payer: Self-pay | Admitting: Psychiatry

## 2015-04-13 VITALS — BP 111/60 | HR 77 | Ht 64.5 in | Wt 147.4 lb

## 2015-04-13 DIAGNOSIS — G4700 Insomnia, unspecified: Secondary | ICD-10-CM | POA: Diagnosis not present

## 2015-04-13 DIAGNOSIS — F17211 Nicotine dependence, cigarettes, in remission: Secondary | ICD-10-CM | POA: Diagnosis not present

## 2015-04-13 DIAGNOSIS — F332 Major depressive disorder, recurrent severe without psychotic features: Secondary | ICD-10-CM

## 2015-04-13 DIAGNOSIS — F4010 Social phobia, unspecified: Secondary | ICD-10-CM

## 2015-04-13 HISTORY — DX: Insomnia, unspecified: G47.00

## 2015-04-13 MED ORDER — HYDROXYZINE PAMOATE 25 MG PO CAPS: 25.0000 mg | ORAL_CAPSULE | Freq: Three times a day (TID) | ORAL | Status: DC

## 2015-04-13 MED ORDER — LAMOTRIGINE 25 MG PO TABS: 75.0000 mg | ORAL_TABLET | Freq: Every day | ORAL | Status: DC
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
## 2015-04-19 ENCOUNTER — Other Ambulatory Visit (HOSPITAL_COMMUNITY): Payer: Self-pay | Admitting: Psychiatry

## 2015-04-19 NOTE — Telephone Encounter (Signed)
discontinued by Dr Doyne Keel on last visit.

## 2015-04-20 ENCOUNTER — Ambulatory Visit (HOSPITAL_COMMUNITY): Payer: Self-pay | Admitting: Psychiatry

## 2015-04-24 ENCOUNTER — Other Ambulatory Visit: Payer: Self-pay | Admitting: Internal Medicine

## 2015-04-30 ENCOUNTER — Telehealth: Payer: Self-pay | Admitting: *Deleted

## 2015-04-30 MED ORDER — TRAMADOL HCL 50 MG PO TABS: 50.0000 mg | ORAL_TABLET | Freq: Two times a day (BID) | ORAL | Status: DC | PRN

## 2015-04-30 NOTE — Telephone Encounter (Signed)
Left msg on triage requesting refill on her Tramadol...[name]

## 2015-04-30 NOTE — Telephone Encounter (Signed)
Printed and signed please fax.

## 2015-04-30 NOTE — Telephone Encounter (Signed)
Script fax to [employer]...[name]

## 2015-05-02 ENCOUNTER — Emergency Department (HOSPITAL_COMMUNITY): Payer: Commercial Managed Care - HMO

## 2015-05-02 ENCOUNTER — Emergency Department (HOSPITAL_COMMUNITY)
Admission: EM | Admit: 2015-05-02 | Discharge: 2015-05-03 | Disposition: A | Discharge status: Home / Self Care | Payer: Commercial Managed Care - HMO | Attending: Emergency Medicine | Admitting: Emergency Medicine

## 2015-05-02 DIAGNOSIS — F1110 Opioid abuse, uncomplicated: Secondary | ICD-10-CM | POA: Diagnosis not present

## 2015-05-02 DIAGNOSIS — E119 Type 2 diabetes mellitus without complications: Secondary | ICD-10-CM | POA: Diagnosis not present

## 2015-05-02 DIAGNOSIS — G8929 Other chronic pain: Secondary | ICD-10-CM | POA: Diagnosis not present

## 2015-05-02 DIAGNOSIS — G43909 Migraine, unspecified, not intractable, without status migrainosus: Secondary | ICD-10-CM | POA: Diagnosis not present

## 2015-05-02 DIAGNOSIS — Z8619 Personal history of other infectious and parasitic diseases: Secondary | ICD-10-CM | POA: Diagnosis not present

## 2015-05-02 DIAGNOSIS — Z880 Allergy status to penicillin: Secondary | ICD-10-CM | POA: Insufficient documentation

## 2015-05-02 DIAGNOSIS — R1110 Vomiting, unspecified: Secondary | ICD-10-CM | POA: Diagnosis not present

## 2015-05-02 DIAGNOSIS — S0081XA Abrasion of other part of head, initial encounter: Secondary | ICD-10-CM | POA: Diagnosis not present

## 2015-05-02 DIAGNOSIS — F329 Major depressive disorder, single episode, unspecified: Secondary | ICD-10-CM | POA: Diagnosis not present

## 2015-05-02 DIAGNOSIS — Y907 Blood alcohol level of 200-239 mg/100 ml: Secondary | ICD-10-CM | POA: Diagnosis not present

## 2015-05-02 DIAGNOSIS — Z791 Long term (current) use of non-steroidal anti-inflammatories (NSAID): Secondary | ICD-10-CM | POA: Insufficient documentation

## 2015-05-02 DIAGNOSIS — Z7952 Long term (current) use of systemic steroids: Secondary | ICD-10-CM | POA: Diagnosis not present

## 2015-05-02 DIAGNOSIS — Z794 Long term (current) use of insulin: Secondary | ICD-10-CM | POA: Insufficient documentation

## 2015-05-02 DIAGNOSIS — M1990 Unspecified osteoarthritis, unspecified site: Secondary | ICD-10-CM | POA: Diagnosis not present

## 2015-05-02 DIAGNOSIS — K219 Gastro-esophageal reflux disease without esophagitis: Secondary | ICD-10-CM | POA: Insufficient documentation

## 2015-05-02 DIAGNOSIS — Z720 Tobacco use: Secondary | ICD-10-CM | POA: Insufficient documentation

## 2015-05-02 DIAGNOSIS — F10129 Alcohol abuse with intoxication, unspecified: Secondary | ICD-10-CM | POA: Insufficient documentation

## 2015-05-02 DIAGNOSIS — Z7982 Long term (current) use of aspirin: Secondary | ICD-10-CM | POA: Diagnosis not present

## 2015-05-02 DIAGNOSIS — F10120 Alcohol abuse with intoxication, uncomplicated: Secondary | ICD-10-CM | POA: Diagnosis not present

## 2015-05-02 DIAGNOSIS — F419 Anxiety disorder, unspecified: Secondary | ICD-10-CM | POA: Insufficient documentation

## 2015-05-02 DIAGNOSIS — R569 Unspecified convulsions: Secondary | ICD-10-CM | POA: Insufficient documentation

## 2015-05-02 DIAGNOSIS — E785 Hyperlipidemia, unspecified: Secondary | ICD-10-CM | POA: Diagnosis not present

## 2015-05-02 DIAGNOSIS — R9431 Abnormal electrocardiogram [ECG] [EKG]: Secondary | ICD-10-CM | POA: Diagnosis not present

## 2015-05-02 DIAGNOSIS — Z79899 Other long term (current) drug therapy: Secondary | ICD-10-CM | POA: Diagnosis not present

## 2015-05-02 DIAGNOSIS — R112 Nausea with vomiting, unspecified: Secondary | ICD-10-CM | POA: Diagnosis not present

## 2015-05-02 DIAGNOSIS — R069 Unspecified abnormalities of breathing: Secondary | ICD-10-CM | POA: Diagnosis not present

## 2015-05-02 DIAGNOSIS — S199XXA Unspecified injury of neck, initial encounter: Secondary | ICD-10-CM | POA: Diagnosis not present

## 2015-05-02 DIAGNOSIS — F10929 Alcohol use, unspecified with intoxication, unspecified: Secondary | ICD-10-CM

## 2015-05-02 LAB — CBC WITH DIFFERENTIAL/PLATELET
BASOS PCT: 1 %
Basophils Absolute: 0.1 10*3/uL (ref 0.0–0.1)
EOS ABS: 0.2 10*3/uL (ref 0.0–0.7)
Eosinophils Relative: 3 %
HEMATOCRIT: 33.1 % — AB (ref 36.0–46.0)
Hemoglobin: 10.8 g/dL — ABNORMAL LOW (ref 12.0–15.0)
Lymphocytes Relative: 35 %
Lymphs Abs: 1.9 10*3/uL (ref 0.7–4.0)
MCH: 26 pg (ref 26.0–34.0)
MCHC: 32.6 g/dL (ref 30.0–36.0)
MCV: 79.8 fL (ref 78.0–100.0)
MONO ABS: 0.4 10*3/uL (ref 0.1–1.0)
Monocytes Relative: 7 %
NEUTROS ABS: 2.9 10*3/uL (ref 1.7–7.7)
Neutrophils Relative %: 54 %
Platelets: 247 10*3/uL (ref 150–400)
RBC: 4.15 MIL/uL (ref 3.87–5.11)
RDW: 17.5 % — AB (ref 11.5–15.5)
WBC: 5.3 10*3/uL (ref 4.0–10.5)

## 2015-05-02 LAB — COMPREHENSIVE METABOLIC PANEL
ALBUMIN: 3.8 g/dL (ref 3.5–5.0)
ALK PHOS: 62 U/L (ref 38–126)
ALT: 20 U/L (ref 14–54)
AST: 33 U/L (ref 15–41)
Anion gap: 10 (ref 5–15)
BILIRUBIN TOTAL: 0.3 mg/dL (ref 0.3–1.2)
BUN: 10 mg/dL (ref 6–20)
CALCIUM: 8.9 mg/dL (ref 8.9–10.3)
CO2: 26 mmol/L (ref 22–32)
Chloride: 97 mmol/L — ABNORMAL LOW (ref 101–111)
Creatinine, Ser: 0.77 mg/dL (ref 0.44–1.00)
GFR calc Af Amer: >60 mL/min (ref >60)
GFR calc non Af Amer: >60 mL/min (ref >60)
GLUCOSE: 120 mg/dL — AB (ref 65–99)
Potassium: 3.7 mmol/L (ref 3.5–5.1)
Sodium: 133 mmol/L — ABNORMAL LOW (ref 135–145)
TOTAL PROTEIN: 7 g/dL (ref 6.5–8.1)

## 2015-05-02 LAB — CBG MONITORING, ED: Glucose-Capillary: 113 mg/dL — ABNORMAL HIGH (ref 65–99)

## 2015-05-02 LAB — SALICYLATE LEVEL

## 2015-05-02 LAB — ETHANOL: Alcohol, Ethyl (B): 255 mg/dL — ABNORMAL HIGH (ref <5)

## 2015-05-02 LAB — ACETAMINOPHEN LEVEL: Acetaminophen (Tylenol), Serum: <10 ug/mL — ABNORMAL LOW (ref 10–30)

## 2015-05-02 MED ORDER — ONDANSETRON HCL 4 MG/2ML IJ SOLN
4.0000 mg | Freq: Once | INTRAMUSCULAR | Status: AC
  Administered 2015-05-02: 4 mg via INTRAVENOUS
  Filled 2015-05-02: qty 2

## 2015-05-02 MED ORDER — SODIUM CHLORIDE 0.9 % IV BOLUS (SEPSIS)
1000.0000 mL | Freq: Once | INTRAVENOUS | Status: AC
  Administered 2015-05-02: 1000 mL via INTRAVENOUS

## 2015-05-02 NOTE — ED Notes (Signed)
Unable to verify history at this time
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
## 2015-05-03 DIAGNOSIS — F419 Anxiety disorder, unspecified: Secondary | ICD-10-CM | POA: Diagnosis not present

## 2015-05-03 DIAGNOSIS — G8929 Other chronic pain: Secondary | ICD-10-CM | POA: Diagnosis not present

## 2015-05-03 DIAGNOSIS — M1990 Unspecified osteoarthritis, unspecified site: Secondary | ICD-10-CM | POA: Diagnosis not present

## 2015-05-03 DIAGNOSIS — F10129 Alcohol abuse with intoxication, unspecified: Secondary | ICD-10-CM | POA: Diagnosis not present

## 2015-05-03 DIAGNOSIS — E119 Type 2 diabetes mellitus without complications: Secondary | ICD-10-CM | POA: Diagnosis not present

## 2015-05-03 DIAGNOSIS — K219 Gastro-esophageal reflux disease without esophagitis: Secondary | ICD-10-CM | POA: Diagnosis not present

## 2015-05-03 DIAGNOSIS — F329 Major depressive disorder, single episode, unspecified: Secondary | ICD-10-CM | POA: Diagnosis not present

## 2015-05-03 DIAGNOSIS — R569 Unspecified convulsions: Secondary | ICD-10-CM | POA: Diagnosis not present

## 2015-05-03 DIAGNOSIS — F1110 Opioid abuse, uncomplicated: Secondary | ICD-10-CM | POA: Diagnosis not present

## 2015-05-03 LAB — RAPID URINE DRUG SCREEN, HOSP PERFORMED
AMPHETAMINES: NOT DETECTED
BARBITURATES: NOT DETECTED
Benzodiazepines: NOT DETECTED
Cocaine: NOT DETECTED
OPIATES: POSITIVE — AB
TETRAHYDROCANNABINOL: NOT DETECTED

## 2015-05-03 LAB — CBG MONITORING, ED: GLUCOSE-CAPILLARY: 72 mg/dL (ref 65–99)

## 2015-05-03 MED ORDER — ACETAMINOPHEN 500 MG PO TABS
1000.0000 mg | ORAL_TABLET | Freq: Once | ORAL | Status: AC
  Administered 2015-05-03: 1000 mg via ORAL
  Filled 2015-05-03: qty 2
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
## 2015-05-03 NOTE — Discharge Instructions (Signed)
You need to not drink so much!!! Your friend told EMS you had seizures tonight. You should follow up with a neurologist. I gave you the number of a neurology group to follow up with. Keep your appointment with your psychiatrist. Return to the ED if you think you might harm yourself or hurt someone else.

## 2015-05-13 ENCOUNTER — Ambulatory Visit: Payer: Self-pay | Admitting: Dietician

## 2015-05-13 ENCOUNTER — Encounter: Payer: Self-pay | Admitting: Endocrinology

## 2015-05-13 ENCOUNTER — Ambulatory Visit (INDEPENDENT_AMBULATORY_CARE_PROVIDER_SITE_OTHER): Payer: Commercial Managed Care - HMO | Admitting: Endocrinology

## 2015-05-13 VITALS — BP 114/60 | HR 78 | Temp 98.6°F | Resp 14 | Ht 64.5 in | Wt 146.4 lb

## 2015-05-13 DIAGNOSIS — E1065 Type 1 diabetes mellitus with hyperglycemia: Secondary | ICD-10-CM | POA: Diagnosis not present

## 2015-05-13 DIAGNOSIS — E7800 Pure hypercholesterolemia, unspecified: Secondary | ICD-10-CM | POA: Diagnosis not present

## 2015-05-13 DIAGNOSIS — E108 Type 1 diabetes mellitus with unspecified complications: Secondary | ICD-10-CM | POA: Diagnosis not present

## 2015-05-13 DIAGNOSIS — IMO0002 Reserved for concepts with insufficient information to code with codable children: Secondary | ICD-10-CM

## 2015-05-13 DIAGNOSIS — Z23 Encounter for immunization: Secondary | ICD-10-CM | POA: Diagnosis not present

## 2015-05-13 NOTE — Progress Notes (Signed)
Patient ID: Leslie Lane, female   DOB: 06/14/1956, 59 y.o.   MRN: [id number]   Reason for Appointment: Diabetes follow-up   History of Present Illness   Diagnosis: Type 1 DIABETES MELITUS, diagnosed 1967     Insulin regimen: Lantus 10 units a.m., 6 units h.s. Humalog 1-2 p.r.n. With vial  She has had labile blood sugar control over the last several years even though A1c has been usually around 7% She has had less lability and hypoglycemia with taking b.i.d. Lantus compared once a day She has been very sensitive to fast acting insulin and frequently does not require mealtime coverage  RECENT history: Her A1c is surprisingly better at 6.7 even though she has had significantly high readings at times She is still taking Lantus twice a day with smaller amounts in the evening Usually does not take Humalog unless blood sugar is significantly high or eating a very large amount of carbohydrate  Difficult to analyze her glucose meter today she has conflicting dates and times with overlapping readings programmed on her meter  Current blood sugar patterns and problems identified:  She recently has had low blood sugars on her meter including a reading of 37  unexpectedly this morning  She was having some high readings over 200 about 2 weeks ago but this was from going off her diet and also consuming more alcohol.  She is still inconsistent with her diet and snacks and may have more snacks in the evening, however she is trying to avoid fried foods and snacks  However last night even with her small snack in blood sugar of 152 fasting she woke up with a glucose of 37 this morning  Again is not getting symptoms when she has had low blood sugars: Most of her low sugars are in the morning hours with only sporadic low readings later in the day  HYPOGLYCEMIA: As above, may not have symptoms with low blood sugars and does not have early warning symptoms. Gets confused frequently and she  depends on her mother to recognize low sugars. Her mother knows how to give Glucagon injection She will treat her low blood sugars usually with juice, does not like glucose tablets; sometimes will eat food at the same time      DIET: She is usually not eating balanced meals although trying to get protein with eggs in the morning now sometimes Meal times: dinner 5-6 p.m. Less snacks like chips, ice cream and popcorn now.  She will eat more when she is anxious, less now.  Monitors blood glucose:  about 4-5 times a day.    Glucometer: One Touch.      Blood Glucose readings from meter download as follows, readings may not be accurate because of conflicting dates and time on her meter:   Mean values apply above for all meters except median for One Touch  PRE-MEAL Fasting Lunch Dinner Bedtime Overall  Glucose range:  37-283   42-323   106- 255   23-279    Mean/median:  166    178   160   169+/-72      Wt Readings from Last 3 Encounters:  05/13/15 146 lb 6.4 oz (66.407 kg)  04/13/15 147 lb 6.4 oz (66.86 kg)  03/25/15 149 lb (67.586 kg)    Lab Results  Component Value Date   HGBA1C 7.9* 02/05/2015   HGBA1C 7.4* 11/05/2014   HGBA1C 7.5* 08/06/2014   Lab Results  Component Value Date   MICROALBUR <  0.7 02/05/2015   LDLCALC 108* 11/05/2014   CREATININE 0.77 05/02/2015          Medication List       This list is accurate as of: 05/13/15 11:59 PM.  Always use your most recent med list.               aspirin 81 MG tablet  Take 81 mg by mouth daily.     betamethasone valerate lotion 0.1 %  Commonly known as:  VALISONE  Apply 1 application topically 2 (two) times daily as needed. Apply to affected area     BIOFLEX PO  Take by mouth.     cholecalciferol 1000 UNITS tablet  Commonly known as:  VITAMIN D  Take 1,000 Units by mouth daily.     diclofenac sodium 1 % Gel  Commonly known as:  VOLTAREN  Apply 2 g topically 4 (four) times daily.     GLUCAGON EMERGENCY 1 MG  injection  Generic drug:  glucagon  INJECT 1 MG INTO THE SKIN AT ONCE AS NEEDED     hydrOXYzine 25 MG capsule  Commonly known as:  VISTARIL  Take 1 capsule (25 mg total) by mouth 3 (three) times daily.     insulin lispro 100 UNIT/ML injection  Commonly known as:  HUMALOG  Sliding scale 10 units in am and 8 units at night     lamoTRIgine 25 MG tablet  Commonly known as:  LAMICTAL  Take 3 tablets (75 mg total) by mouth daily.     LANTUS 100 UNIT/ML injection  Generic drug:  insulin glargine  INJECT 9 UNITS INTO THE SKIN IN THE MORNING AS INSTRUCTED (DISCARD VIAL 28 DAYS AFTER OPENING)     loratadine 10 MG tablet  Commonly known as:  CLARITIN  Take 10 mg by mouth as needed for allergies.     lovastatin 40 MG tablet  Commonly known as:  MEVACOR  TAKE 1 TABLET BY MOUTH AT BEDTIME     multivitamin tablet  Take 1 tablet by mouth daily.     neomycin-polymyxin b-dexamethasone 3.5-10000-0.1 Susp  Commonly known as:  MAXITROL  Place 2 drops into the left eye 3 (three) times daily.     omeprazole 40 MG capsule  Commonly known as:  PRILOSEC  Take 1 capsule (40 mg total) by mouth 2 (two) times daily.     ONE TOUCH ULTRA TEST test strip  Generic drug:  glucose blood  USE  TO CHECK BLOOD SUGAR FOUR TIMES DAILY     RABEprazole 20 MG tablet  Commonly known as:  ACIPHEX  Take 1 tablet (20 mg total) by mouth daily.     traMADol 50 MG tablet  Commonly known as:  ULTRAM  Take 1 tablet (50 mg total) by mouth every 12 (twelve) hours as needed for moderate pain or severe pain.     trazodone 300 MG tablet  Commonly known as:  DESYREL  Take 1 tablet (300 mg total) by mouth at bedtime.     vitamin A 10000 UNIT capsule  Take 10,000 Units by mouth daily.     vitamin B-12 1000 MCG tablet  Commonly known as:  CYANOCOBALAMIN  Take 1,000 mcg by mouth daily.     vitamin E 1000 UNIT capsule  Generic drug:  vitamin E  Take 1,000 Units by mouth daily.     zinc gluconate 50 MG tablet    Take 50 mg by mouth daily.  Allergies:  Allergies  Allergen Reactions  . Penicillins   . Sulfonamide Derivatives     Past Medical History  Diagnosis Date  . HEPATITIS B   . BIPOLAR AFFECTIVE DISORDER   . SMOKER   . MIGRAINE HEADACHE   . NECK PAIN, CHRONIC   . LOW BACK PAIN, CHRONIC   . ALLERGIC RHINITIS   . HYPERLIPIDEMIA   . GERD   . DIABETES MELLITUS, TYPE II     follows with endo  . DEPRESSION   . NARCOTIC ABUSE     hx of  . Anxiety   . Arthritis   . Fibromyalgia   . Diabetes mellitus type I [facility])     Past Surgical History  Procedure Laterality Date  . Cesarean section      Family History  Problem Relation Age of Onset  . Arthritis Mother   . Arthritis Father   . Kidney disease Father   . Kidney cancer Father   . Hyperlipidemia Other   . Kidney cancer Paternal Aunt   . Heart attack Brother   . Bladder Cancer Sister   . Anxiety disorder Neg Hx   . Bipolar disorder Neg Hx   . Depression Neg Hx     Social History:  reports that she has been smoking Cigarettes and E-cigarettes.  She has a 40 pack-year smoking history. She has never used smokeless tobacco. She reports that she drinks alcohol. She reports that she does not use illicit drugs.  Review of Systems:  She has occasional headaches and back pain.  She was sometimes take Vicodin from a friend for this and has not discussed with PCP.  Her opiate screening was positive in the ER  DEPRESSION: She has had long-standing depression and anxiety  She continues to have depression, insomnia and anxiety  HYPERLIPIDEMIA: The lipid abnormality consists of elevated LDL   Is  on lovastatin 40 mg and does not complain of any  muscle aches Her LDL has been mildly increased as I'll 5/16.   Lab Results  Component Value Date   CHOL 199 11/05/2014   HDL 76.80 11/05/2014   LDLCALC 108* 11/05/2014   LDLDIRECT 101.7 03/26/2012   TRIG 69.0 11/05/2014   CHOLHDL 3 11/05/2014    She has been recommended   low-dose Cozaar by PCP, no history of hypertension or microalbuminuria.  Currently not taking this.  Has history of small goiter but no hypothyroidism  Lab Results  Component Value Date   TSH 0.78 04/27/2014        Examination:   BP 114/60 mmHg  Pulse 78  Temp(Src) 98.6 F (37 C)  Resp 14  Ht 5' 4.5" (1.638 m)  Wt 146 lb 6.4 oz (66.407 kg)  BMI 24.75 kg/m2  SpO2 96%  Body mass index is 24.75 kg/(m^2).   No significant thyroid enlargement felt No ankle edema  ASSESSMENT/ PLAN:   Diabetes type 1 See history of present illness for detailed discussion of current insulin, blood sugar patterns, problems identified Her blood sugars are somewhat difficult to evaluate because of her monitor having conflicting times and dates Also patient thinks that since her monitor is at least 61 years old it may not be accurate She also had hyperglycemia likely related to alcohol intake until a few days ago  She is still not aware of carbohydrate counting and also says that she will adjust her Lantus in the evening based on what she is eating.  Also discussed that she did not need to  take extra insulin when she had sugar free candy She has taken her Humalog sporadically and only when the blood sugars are significantly high; also her history is somewhat unreliable because of not consistent  memory Most recently is having occasional unexpected low sugars on waking up including 37 this morning  She was given a new glucose monitor To try to have more consistent diet, balanced meals and avoid large snacks and high fat content Will reduce her Lantus by one unit and have her stay with 5 units in the evening She can still take 1-2 units of Humalog if eating a very high carbohydrate or high-fat meal and also for readings over 250 Offered consultation with dietitian but she does not want to do this  Counseling time on subjects discussed above is over 50% of today's 25 minute visit  Patient Instructions    Lantus 5 at nite  No Humalog or extra lantus for sugar free candy     Nanna Ertle 05/14/2015, 8:13 AM   -

## 2015-05-13 NOTE — Patient Instructions (Addendum)
Lantus 5 at nite  No Humalog or extra lantus for sugar free candy

## 2015-05-14 LAB — POCT GLYCOSYLATED HEMOGLOBIN (HGB A1C): HEMOGLOBIN A1C: 6.7

## 2015-05-17 DIAGNOSIS — Z794 Long term (current) use of insulin: Secondary | ICD-10-CM | POA: Diagnosis not present

## 2015-05-17 DIAGNOSIS — H5210 Myopia, unspecified eye: Secondary | ICD-10-CM | POA: Diagnosis not present

## 2015-05-17 DIAGNOSIS — H52223 Regular astigmatism, bilateral: Secondary | ICD-10-CM | POA: Diagnosis not present

## 2015-05-17 DIAGNOSIS — I10 Essential (primary) hypertension: Secondary | ICD-10-CM | POA: Diagnosis not present

## 2015-05-17 DIAGNOSIS — E109 Type 1 diabetes mellitus without complications: Secondary | ICD-10-CM | POA: Diagnosis not present

## 2015-05-17 DIAGNOSIS — H524 Presbyopia: Secondary | ICD-10-CM | POA: Diagnosis not present

## 2015-05-17 DIAGNOSIS — H5203 Hypermetropia, bilateral: Secondary | ICD-10-CM | POA: Diagnosis not present

## 2015-05-17 LAB — HM DIABETES EYE EXAM

## 2015-05-29 ENCOUNTER — Encounter (HOSPITAL_COMMUNITY): Payer: Self-pay

## 2015-05-29 ENCOUNTER — Emergency Department (HOSPITAL_COMMUNITY)
Admission: EM | Admit: 2015-05-29 | Discharge: 2015-05-29 | Disposition: A | Discharge status: Home / Self Care | Payer: Commercial Managed Care - HMO | Attending: Emergency Medicine | Admitting: Emergency Medicine

## 2015-05-29 ENCOUNTER — Emergency Department (HOSPITAL_COMMUNITY): Payer: Commercial Managed Care - HMO

## 2015-05-29 DIAGNOSIS — S99912A Unspecified injury of left ankle, initial encounter: Secondary | ICD-10-CM | POA: Insufficient documentation

## 2015-05-29 DIAGNOSIS — Y998 Other external cause status: Secondary | ICD-10-CM | POA: Diagnosis not present

## 2015-05-29 DIAGNOSIS — G8929 Other chronic pain: Secondary | ICD-10-CM | POA: Diagnosis not present

## 2015-05-29 DIAGNOSIS — E119 Type 2 diabetes mellitus without complications: Secondary | ICD-10-CM | POA: Insufficient documentation

## 2015-05-29 DIAGNOSIS — M1990 Unspecified osteoarthritis, unspecified site: Secondary | ICD-10-CM | POA: Insufficient documentation

## 2015-05-29 DIAGNOSIS — Z8619 Personal history of other infectious and parasitic diseases: Secondary | ICD-10-CM | POA: Insufficient documentation

## 2015-05-29 DIAGNOSIS — F17210 Nicotine dependence, cigarettes, uncomplicated: Secondary | ICD-10-CM | POA: Diagnosis not present

## 2015-05-29 DIAGNOSIS — Y9289 Other specified places as the place of occurrence of the external cause: Secondary | ICD-10-CM | POA: Diagnosis not present

## 2015-05-29 DIAGNOSIS — S8992XA Unspecified injury of left lower leg, initial encounter: Secondary | ICD-10-CM | POA: Diagnosis not present

## 2015-05-29 DIAGNOSIS — M797 Fibromyalgia: Secondary | ICD-10-CM | POA: Insufficient documentation

## 2015-05-29 DIAGNOSIS — Z794 Long term (current) use of insulin: Secondary | ICD-10-CM | POA: Diagnosis not present

## 2015-05-29 DIAGNOSIS — E785 Hyperlipidemia, unspecified: Secondary | ICD-10-CM | POA: Diagnosis not present

## 2015-05-29 DIAGNOSIS — Z791 Long term (current) use of non-steroidal anti-inflammatories (NSAID): Secondary | ICD-10-CM | POA: Diagnosis not present

## 2015-05-29 DIAGNOSIS — S82435A Nondisplaced oblique fracture of shaft of left fibula, initial encounter for closed fracture: Secondary | ICD-10-CM | POA: Diagnosis not present

## 2015-05-29 DIAGNOSIS — F319 Bipolar disorder, unspecified: Secondary | ICD-10-CM | POA: Insufficient documentation

## 2015-05-29 DIAGNOSIS — K219 Gastro-esophageal reflux disease without esophagitis: Secondary | ICD-10-CM | POA: Diagnosis not present

## 2015-05-29 DIAGNOSIS — Y9389 Activity, other specified: Secondary | ICD-10-CM | POA: Diagnosis not present

## 2015-05-29 DIAGNOSIS — G43909 Migraine, unspecified, not intractable, without status migrainosus: Secondary | ICD-10-CM | POA: Diagnosis not present

## 2015-05-29 DIAGNOSIS — M25572 Pain in left ankle and joints of left foot: Secondary | ICD-10-CM | POA: Diagnosis not present

## 2015-05-29 DIAGNOSIS — Z7982 Long term (current) use of aspirin: Secondary | ICD-10-CM | POA: Insufficient documentation

## 2015-05-29 DIAGNOSIS — S99922A Unspecified injury of left foot, initial encounter: Secondary | ICD-10-CM | POA: Diagnosis present

## 2015-05-29 DIAGNOSIS — Z880 Allergy status to penicillin: Secondary | ICD-10-CM | POA: Insufficient documentation

## 2015-05-29 DIAGNOSIS — Z79899 Other long term (current) drug therapy: Secondary | ICD-10-CM | POA: Insufficient documentation

## 2015-05-29 DIAGNOSIS — W108XXA Fall (on) (from) other stairs and steps, initial encounter: Secondary | ICD-10-CM | POA: Diagnosis not present

## 2015-05-29 DIAGNOSIS — S89302A Unspecified physeal fracture of lower end of left fibula, initial encounter for closed fracture: Secondary | ICD-10-CM | POA: Diagnosis not present

## 2015-05-29 DIAGNOSIS — S82832A Other fracture of upper and lower end of left fibula, initial encounter for closed fracture: Secondary | ICD-10-CM | POA: Diagnosis not present

## 2015-05-29 DIAGNOSIS — F419 Anxiety disorder, unspecified: Secondary | ICD-10-CM | POA: Insufficient documentation

## 2015-05-29 MED ORDER — OXYCODONE-ACETAMINOPHEN 5-325 MG PO TABS: 2.0000 | ORAL_TABLET | ORAL | Status: DC | PRN

## 2015-05-29 MED ORDER — OXYCODONE-ACETAMINOPHEN 5-325 MG PO TABS
1.0000 | ORAL_TABLET | Freq: Once | ORAL | Status: AC
  Administered 2015-05-29: 1 via ORAL
  Filled 2015-05-29: qty 1

## 2015-05-29 NOTE — ED Notes (Signed)
Patient transported to X-ray 

## 2015-05-29 NOTE — ED Provider Notes (Signed)
CSN: [id number]     Arrival date & time 05/29/15  1144 History  By signing my name below, I, Starleen Arms, attest that this documentation has been prepared under the direction and in the presence of Kingson Lohmeyer, PA-C. Electronically Signed: Starleen Arms ED Scribe. 05/29/2015. 12:13 PM.    Chief Complaint  Patient presents with  . Fall  . Foot Pain   Patient is a 59 y.o. female presenting with lower extremity pain. The history is provided by the patient. No language interpreter was used.  Foot Pain   HPI Comments: Leslie Lane is a 59 y.o. female who presents to the Emergency Department complaining of a fall that occurred last night at 8:00 pm.  The patient reports she was walking down unlighted steps, missed the last two, landed on her left foot, twisted her left foot and then fell onto her right side.  She denies head trauma, LOC.  She complains of constant, moderate pain in the left ankle that is worse with weight bearing as well as pain in the back of the left lower leg below the knee.  She also notes some generalized soreness throughout her body but denies other complaints.  She is prescribed Tramadol for fibromyalgia but has not taken this medication today or tried any treatments PTA.  Patient reports an allergy to sulfa and pain medication.    Past Medical History  Diagnosis Date  . HEPATITIS B   . BIPOLAR AFFECTIVE DISORDER   . SMOKER   . MIGRAINE HEADACHE   . NECK PAIN, CHRONIC   . LOW BACK PAIN, CHRONIC   . ALLERGIC RHINITIS   . HYPERLIPIDEMIA   . GERD   . DIABETES MELLITUS, TYPE II     follows with endo  . DEPRESSION   . NARCOTIC ABUSE     hx of  . Anxiety   . Arthritis   . Fibromyalgia   . Diabetes mellitus type I [facility])    Past Surgical History  Procedure Laterality Date  . Cesarean section     Family History  Problem Relation Age of Onset  . Arthritis Mother   . Arthritis Father   . Kidney disease Father   . Kidney cancer Father   . Hyperlipidemia  Other   . Kidney cancer Paternal Aunt   . Heart attack Brother   . Bladder Cancer Sister   . Anxiety disorder Neg Hx   . Bipolar disorder Neg Hx   . Depression Neg Hx    Social History  Substance Use Topics  . Smoking status: Current Every Day Smoker -- 1.00 packs/day for 40 years    Types: Cigarettes, E-cigarettes  . Smokeless tobacco: Never Used  . Alcohol Use: 0.0 oz/week    0 Standard drinks or equivalent per week     Comment: occasionally- once every 2 weeks 1-2 beers   OB History    Gravida Para Term Preterm AB TAB SAB Ectopic Multiple Living   1 1 0 1 0 0 0 0 0 1      Review of Systems A complete 10 system review of systems was obtained and all systems are negative except as noted in the HPI and PMH.   Allergies  Penicillins and Sulfonamide derivatives  Home Medications   Prior to Admission medications   Medication Sig Start Date End Date Taking? Authorizing Provider  aspirin 81 MG tablet Take 81 mg by mouth daily.    Historical Provider, MD  betamethasone valerate lotion (VALISONE) 0.1 %  Apply 1 application topically 2 (two) times daily as needed. Apply to affected area 02/20/15   Historical Provider, MD  Bioflavonoid Products (BIOFLEX PO) Take by mouth.    Historical Provider, MD  cholecalciferol (VITAMIN D) 1000 UNITS tablet Take 1,000 Units by mouth daily.    Historical Provider, MD  diclofenac sodium (VOLTAREN) 1 % GEL Apply 2 g topically 4 (four) times daily. 10/13/14   [name], MD  GLUCAGON EMERGENCY 1 MG injection INJECT 1 MG INTO THE SKIN AT ONCE AS NEEDED 12/28/14   [name], MD  hydrOXYzine (VISTARIL) 25 MG capsule Take 1 capsule (25 mg total) by mouth 3 (three) times daily. 04/13/15   Charlcie Cradle, MD  insulin lispro (HUMALOG) 100 UNIT/ML injection Sliding scale 10 units in am and 8 units at night 01/28/15   [name], MD  lamoTRIgine (LAMICTAL) 25 MG tablet Take 3 tablets (75 mg total) by mouth daily. 04/13/15   Charlcie Cradle, MD  LANTUS 100  UNIT/ML injection INJECT 9 UNITS INTO THE SKIN IN THE MORNING AS INSTRUCTED (DISCARD VIAL 28 DAYS AFTER OPENING) Patient taking differently: INJECT 10 UNITS INTO THE SKIN IN THE MORNING AS INSTRUCTED (DISCARD VIAL 28 DAYS AFTER OPENING) 01/25/15   [name], MD  loratadine (CLARITIN) 10 MG tablet Take 10 mg by mouth as needed for allergies.    Historical Provider, MD  lovastatin (MEVACOR) 40 MG tablet TAKE 1 TABLET BY MOUTH AT BEDTIME 04/26/15   [name], MD  Multiple Vitamin (MULTIVITAMIN) tablet Take 1 tablet by mouth daily.      Historical Provider, MD  neomycin-polymyxin b-dexamethasone (MAXITROL) 3.5-10000-0.1 SUSP Place 2 drops into the left eye 3 (three) times daily. 03/23/15   [name], MD  omeprazole (PRILOSEC) 40 MG capsule Take 1 capsule (40 mg total) by mouth 2 (two) times daily. 03/02/15   [name], MD  ONE TOUCH ULTRA TEST test strip USE  TO CHECK BLOOD SUGAR FOUR TIMES DAILY 03/30/15   [name], MD  RABEprazole (ACIPHEX) 20 MG tablet Take 1 tablet (20 mg total) by mouth daily. 03/31/15   [name], MD  traMADol (ULTRAM) 50 MG tablet Take 1 tablet (50 mg total) by mouth every 12 (twelve) hours as needed for moderate pain or severe pain. 04/30/15   [name], MD  traZODone (DESYREL) 300 MG tablet Take 1 tablet (300 mg total) by mouth at bedtime. 02/18/15   Kathlee Nations, MD  vitamin A 10000 UNIT capsule Take 10,000 Units by mouth daily.    Historical Provider, MD  vitamin B-12 (CYANOCOBALAMIN) 1000 MCG tablet Take 1,000 mcg by mouth daily.      Historical Provider, MD  vitamin E (VITAMIN E) 1000 UNIT capsule Take 1,000 Units by mouth daily.      Historical Provider, MD  zinc gluconate 50 MG tablet Take 50 mg by mouth daily.    Historical Provider, MD   BP 124/58 mmHg  Pulse 73  Temp(Src) 97.4 F (36.3 C) (Oral)  Resp 18  SpO2 98% Physical Exam  Constitutional: She is oriented to person, place, and time. She appears  well-developed and well-nourished. No distress.  HENT:  Head: Normocephalic and atraumatic.  Eyes: Conjunctivae are normal. Right eye exhibits no discharge. Left eye exhibits no discharge. No scleral icterus.  Cardiovascular: Normal rate and intact distal pulses.   Pulmonary/Chest: Effort normal.  Musculoskeletal: Normal range of motion. She exhibits tenderness.  TTP of the L lateral malleolus. Pain increased  with dorsi and plantar flexion of L foot. No obvious bony deformity. No edema or ecchymosis. Good cap refill. Intact distal pulses.  Neurological: She is alert and oriented to person, place, and time. Coordination normal.  Skin: Skin is warm and dry. No rash noted. She is not diaphoretic. No erythema. No pallor.  Psychiatric: She has a normal mood and affect. Her behavior is normal.  Nursing note and vitals reviewed.   ED Course  Procedures (including critical care time)  DIAGNOSTIC STUDIES: Oxygen Saturation is 98% on RA, normal by my interpretation.    COORDINATION OF CARE:  12:37 PM Concern for possible maisonneuve fx, talar widening.  Will order tib/fib x-ray and pain medication.  Patient acknowledges and agrees with plan.    Labs Review Labs Reviewed - No data to display  Imaging Review Dg Tibia/fibula Left  05/29/2015  CLINICAL DATA:  Fall yesterday, lateral ankle pain EXAM: LEFT TIBIA AND FIBULA - 2 VIEW COMPARISON:  Left ankle same day FINDINGS: Three views of the left tibia fibula submitted. There is nondisplaced fracture in distal shaft of left fibula. IMPRESSION: Oblique nondisplaced fracture distal shaft of left fibula. Electronically Signed   By: Lahoma Crocker M.D.   On: 05/29/2015 13:02   Dg Ankle Complete Left  05/29/2015  CLINICAL DATA:  [place]; pain and swelling on lateral left ankle; no previous injury EXAM: LEFT ANKLE COMPLETE - 3+ VIEW COMPARISON:  None. FINDINGS: Oblique fracture through the distal fibula just above the ankle  mortise. Ankle mortise intact. No malleolar fracture. Calcaneus is normal. IMPRESSION: Nondisplaced fracture of the distal fibular metaphysis Electronically Signed   By: Suzy Bouchard M.D.   On: 05/29/2015 12:15   I have personally reviewed and evaluated these images and lab results as part of my medical decision-making.   EKG Interpretation None      MDM   Final diagnoses:  Fracture of distal end of left fibula    Pt found to have distal fibula fracture on xray after a mechanical fall that occurred last night. No other injury. Concern for possible Maisonneuve fx, will order tib/fib xray to assess. Pt given pain medication in ED.   Tib/fib xray shows no additional fracture. Will place pt in cam walker and give crutches for additional support. WIll d/c home with short course of pain medications. Recommend orthopedic follow up. Also recommend RICE precautions. Return precautions outlined in patient discharge instructions.    I personally performed the services described in this documentation, which was scribed in my presence. The recorded information has been reviewed and is accurate.     Dondra Spry Robards, PA-C 05/30/15 2017  Daleen Bo, MD 05/31/15 [phone]

## 2015-05-29 NOTE — ED Notes (Signed)
Pt presents with c/o fall that occurred around 8 pm last night. Pt reports she fell down two cement stairs and landed on the cement at the bottom. Pt reporting pain to that left foot area.

## 2015-05-29 NOTE — Discharge Instructions (Signed)
Fibular Fracture With Rehab °The fibula is the smaller of the two lower leg bones and is vulnerable to breaks (fracture). Fibular fractures may go fully through the bone (complete) or partially (incomplete). The bone fragments are rarely out of alignment (displaced fracture). Fibula fractures may occur anywhere along the bone. However, this document only discusses fractures that do not involve a leg joint. Fibular fractures are not often a severe injury because the bone supports only about 17% of the body weight. °SYMPTOMS  °· Moderate to severe pain in the lower leg. °· Tenderness and swelling in the leg or calf. °· Bleeding and/or bruising (contusion) in the leg. °· Inability to bear weight on the injured extremity. °· Visible deformity, if the fracture is displaced. °· Numbness and coldness in the leg and foot, beyond the fracture site, if blood supply is impaired. °CAUSES  °Fractures occur when a force is placed on the bone that is greater than it can withstand. Common causes of fibular fracture include: °· Direct hit (trauma) (i.e., hockey or lacrosse check to the lower leg). °· Stress fracture (weakening of the bone from repeated stress). °· Indirect injury, caused by twisting, turning quickly, or violent muscle contraction. °RISK INCREASES WITH: °· Contact sports (i.e., football, soccer, lacrosse, hockey). °· Sports that can cause twisted ankle injury (i.e., skiing, basketball). °· Bony abnormalities (i.e., osteoporosis or bone tumors). °· Metabolism disorders, hormone problems, and nutrition deficiency and disorders (i.e., anorexia and bulimia). °· Poor strength and flexibility. °PREVENTION  °· Warm up and stretch properly before activity. °· Maintain physical fitness: °¨ Strength, flexibility, and endurance. °¨ Cardiovascular fitness. °· Wear properly fitted and padded protective equipment (i.e., shin guards for soccer). °PROGNOSIS  °If treated properly, fibular fractures usually heal in 4 to 6 weeks.    °RELATED COMPLICATIONS  °· Failure of bone to heal (nonunion). °· Bone heals in a poor position (malunion). °· Increased pressure inside the leg (compartment syndrome) due to injury that disrupts the blood supply to the leg and foot and injures the nerves and muscles (uncommon). °· Shortening of the injured bones. °· Hindrance of normal bone growth in children. °· Risks of surgery: infection, bleeding, injury to nerves (numbness, weakness, paralysis), need for further surgery. °· Longer healing time if activity is resumed too soon. °TREATMENT °Treatment first involves ice, medicine, and elevation of the leg to reduce pain and inflammation. People with fibular fractures are advised to walk using crutches. A brace or walking boot may be given to restrain the injured leg and allow for healing. Sometimes, surgery is needed to place a rod, plate, or screws in the bones in order to fix the fracture. After surgery, the leg is restrained. After restraint (with or without surgery), it is important to complete strengthening and stretching exercises to regain strength and a full range of motion. Exercises may be completed at home or with a therapist. °MEDICATION  °· If pain medicine is needed, nonsteroidal anti-inflammatory medicines (aspirin and ibuprofen), or other minor pain relievers (acetaminophen), are often advised. °· Do not take pain medicine for 7 days before surgery. °· Prescription pain relievers may be given if your health care provider thinks they are needed. Use only as directed and only as much as you need. °SEEK MEDICAL CARE IF: °· Symptoms get worse or do not improve in 2 weeks, despite treatment. °· The following occur after restraint or surgery. (Report any of these signs immediately): °¨ Swelling above or below the fracture site. °¨ Severe, persistent pain. °¨   Blue or gray skin below the fracture site, especially under the toenails. Numbness or loss of feeling below the fracture site. °¨ New, unexplained  symptoms develop. (Drugs used in treatment may produce side effects.) °EXERCISES  °RANGE OF MOTION (ROM) AND STRETCHING EXERCISES - Fibular Fracture °These exercises may help you when beginning to recover from your injury. Your symptoms may go away with or without further involvement from your physician, physical therapist or athletic trainer. While completing these exercises, remember:  °· Restoring tissue flexibility helps normal motion to return to the joints. This allows healthier, less painful movement and activity. °· An effective stretch should be held for at least 30 seconds. °· A stretch should never be painful. You should only feel a gentle lengthening or release in the stretched tissue. °RANGE OF MOTION - Dorsi/Plantar Flexion °· While sitting with your right / left knee straight, draw the top of your foot upwards by flexing your ankle. Then reverse the motion, pointing your toes downward. °· Hold each position for __________ seconds. °· After completing your first set of exercises, repeat this exercise with your knee bent. °Repeat __________ times. Complete this exercise __________ times per day.  °STRETCH - Gastrocsoleus  °· Sit with your right / left leg extended. Holding onto both ends of a belt or towel, loop it around the ball of your foot. °· Keeping your right / left ankle and foot relaxed and your knee straight, pull your foot and ankle toward you using the belt. °· You should feel a gentle stretch behind your calf or knee. Hold this position for __________ seconds. °Repeat __________ times. Complete this stretch __________ times per day.  °RANGE OF MOTION- Ankle Plantar Flexion  °· Sit with your right / left leg crossed over your opposite knee. °· Use your opposite hand to pull the top of your foot and toes toward you. °· You should feel a gentle stretch on the top of your foot and ankle. Hold this position for __________ seconds. °Repeat __________ times. Complete __________ times per day.    °RANGE OF MOTION - Ankle Eversion °· Sit with your right / left ankle crossed over your opposite knee. °· Grip your foot with your opposite hand, placing your thumb on the top of your foot and your fingers across the bottom of your foot. °· Gently push your foot downward with a slight rotation so your littlest toes rise slightly toward the ceiling. °· You should feel a gentle stretch on the inside of your ankle. Hold the stretch for __________ seconds. °Repeat __________ times. Complete this exercise __________ times per day.  °RANGE OF MOTION - Ankle Inversion °· Sit with your right / left ankle crossed over your opposite knee. °· Grip your foot with your opposite hand, placing your thumb on the bottom of your foot and your fingers across the top of your foot. °· Gently pull your foot so the smallest toe comes toward you and your thumb pushes the inside of the ball of your foot away from you. °· You should feel a gentle stretch on the outside of your ankle. Hold the stretch for __________ seconds. °Repeat __________ times. Complete this exercise __________ times per day.  °RANGE OF MOTION - Ankle Alphabet °· Imagine your right / left big toe is a pen. °· Keeping your hip and knee still, write out the entire alphabet with your "pen." Make the letters as large as you can, without increasing any discomfort. °Repeat __________ times. Complete this exercise   __________ times per day.  °RANGE OF MOTION - Ankle Dorsiflexion, Active Assisted °· Remove your shoes and sit on a chair, preferably not on a carpeted surface. °· Place your right / left foot on the floor, directly under your knee. Extend your opposite leg for support. °· Keeping your heel down, slide your right / left foot back toward the chair, until you feel a stretch at your ankle or calf. If you do not feel a stretch, slide your bottom forward to the edge of the chair, while still keeping your heel down. °· Hold this stretch for __________ seconds. °Repeat  __________ times. Complete this stretch __________ times per day.  °STRENGTHENING EXERCISES - Fibular Fracture °These exercises may help you when beginning to recover from your injury. They may resolve your symptoms with or without further involvement from your physician, physical therapist or athletic trainer. While completing these exercises, remember:  °· Muscles can gain both the endurance and the strength needed for everyday activities through controlled exercises. °· Complete these exercises as instructed by your physician, physical therapist or athletic trainer. Increase the resistance and repetitions only as guided. °· You may experience muscle soreness or fatigue, but the pain or discomfort you are trying to eliminate should never worsen during these exercises. If this pain does get worse, stop and make certain you are following the directions exactly. If the pain is still present after adjustments, discontinue the exercise until you can discuss the trouble with your clinician. °STRENGTH - Dorsiflexors °· Secure a rubber exercise band or tubing to a fixed object (table, pole) and loop the other end around your right / left foot. °· Sit on the floor, facing the fixed object. The band should be slightly tense when your foot is relaxed. °· Slowly draw your foot back toward you, using your ankle and toes. °· Hold this position for __________ seconds. Slowly release the tension in the band and return your foot to the starting position. °Repeat __________ times. Complete this exercise __________ times per day.  °STRENGTH - Plantar-flexors °· Sit with your right / left leg extended. Holding onto both ends of a rubber exercise band or tubing, loop it around the ball of your foot. Keep a slight tension in the band. °· Slowly push your toes away from you, pointing them downward. °· Hold this position for __________ seconds. Return to the starting position slowly, controlling the tension in the band. °Repeat  __________ times. Complete this exercise __________ times per day.  °STRENGTH - Plantar-flexors, Standing  °· Stand with your feet shoulder width apart. Place your hands on a wall or table to steady yourself, using as little support as needed. °· Keeping your weight evenly spread over the width of your feet, rise up on your toes.* °· Hold this position for __________ seconds. °Repeat __________ times. Complete this exercise __________ times per day.  °*If this is too easy, shift your weight toward your right / left leg until you feel challenged. Ultimately, you may be asked to do this exercise while standing on your right / left foot only. °STRENGTH - Towel Curls °· Sit in a chair, on a non-carpeted surface. °· Place your foot on a towel, keeping your heel on the floor. °· Pull the towel toward your heel only by curling your toes. Keep your heel on the floor. °· If instructed by your physician, physical therapist or athletic trainer, add ____________________ at the end of the towel. °Repeat __________ times. Complete this exercise __________   times per day. STRENGTH - Ankle Eversion  Secure one end of a rubber exercise band or tubing to a fixed object (table, pole). Loop the other end around your foot, just before your toes.  Place your fists between your knees. This will focus your strengthening at your ankle.  Drawing the band across your opposite foot, away from the pole, slowly pull your little toe out and up. Make sure the band is positioned to resist the entire motion.  Hold this position for __________ seconds.  Return to the starting position slowly, controlling the tension in the band. Repeat __________ times. Complete this exercise __________ times per day.  STRENGTH - Ankle Inversion  Secure one end of a rubber exercise band or tubing to a fixed object (table, pole). Loop the other end around your foot, just before your toes.  Place your fists between your knees. This will focus your  strengthening at your ankle.  Slowly, pull your big toe up and in, making sure the band is positioned to resist the entire motion.  Hold this position for __________ seconds.  Return to the starting position slowly, controlling the tension in the band. Repeat __________ times. Complete this exercises __________ times per day.    This information is not intended to replace advice given to you by your health care provider. Make sure you discuss any questions you have with your health care provider.   Document Released: 06/19/2005 Document Revised: 07/10/2014 Document Reviewed: 10/01/2008 Elsevier Interactive Patient Education 2016 [place] or Splint Care Casts and splints support injured limbs and keep bones from moving while they heal. It is important to care for your cast or splint at home.  HOME CARE INSTRUCTIONS  Keep the cast or splint uncovered during the drying period. It can take 24 to 48 hours to dry if it is made of plaster. A fiberglass cast will dry in less than 1 hour.  Do not rest the cast on anything harder than a pillow for the first 24 hours.  Do not put weight on your injured limb or apply pressure to the cast until your health care provider gives you permission.  Keep the cast or splint dry. Wet casts or splints can lose their shape and may not support the limb as well. A wet cast that has lost its shape can also create harmful pressure on your skin when it dries. Also, wet skin can become infected.  Cover the cast or splint with a plastic bag when bathing or when out in the rain or snow. If the cast is on the trunk of the body, take sponge baths until the cast is removed.  If your cast does become wet, dry it with a towel or a blow dryer on the cool setting only.  Keep your cast or splint clean. Soiled casts may be wiped with a moistened cloth.  Do not place any hard or soft foreign objects under your cast or splint, such as cotton, toilet paper, lotion,  or powder.  Do not try to scratch the skin under the cast with any object. The object could get stuck inside the cast. Also, scratching could lead to an infection. If itching is a problem, use a blow dryer on a cool setting to relieve discomfort.  Do not trim or cut your cast or remove padding from inside of it.  Exercise all joints next to the injury that are not immobilized by the cast or splint. For example, if you have  a long leg cast, exercise the hip joint and toes. If you have an arm cast or splint, exercise the shoulder, elbow, thumb, and fingers.  Elevate your injured arm or leg on 1 or 2 pillows for the first 1 to 3 days to decrease swelling and pain.It is best if you can comfortably elevate your cast so it is higher than your heart. SEEK MEDICAL CARE IF:   Your cast or splint cracks.  Your cast or splint is too tight or too loose.  You have unbearable itching inside the cast.  Your cast becomes wet or develops a soft spot or area.  You have a bad smell coming from inside your cast.  You get an object stuck under your cast.  Your skin around the cast becomes red or raw.  You have new pain or worsening pain after the cast has been applied. SEEK IMMEDIATE MEDICAL CARE IF:   You have fluid leaking through the cast.  You are unable to move your fingers or toes.  You have discolored (blue or white), cool, painful, or very swollen fingers or toes beyond the cast.  You have tingling or numbness around the injured area.  You have severe pain or pressure under the cast.  You have any difficulty with your breathing or have shortness of breath.  You have chest pain.   This information is not intended to replace advice given to you by your health care provider. Make sure you discuss any questions you have with your health care provider.  Follow up with your primary care provider for re-evaluation. Follow up with ortho if your symptoms worsen. Apply ice to affected area.  Keep leg elevated. Return to the Emergency Department if you experience worsening of your symptoms, increased swelling of leg, discolored extremity, loss of sensation, numbness/tingling in extremity, chest pain or SOB.

## 2015-06-01 ENCOUNTER — Ambulatory Visit: Payer: Self-pay | Admitting: Family

## 2015-06-01 ENCOUNTER — Encounter: Payer: Self-pay | Admitting: *Deleted

## 2015-06-04 ENCOUNTER — Ambulatory Visit (INDEPENDENT_AMBULATORY_CARE_PROVIDER_SITE_OTHER): Payer: Commercial Managed Care - HMO | Admitting: Family

## 2015-06-04 ENCOUNTER — Encounter: Payer: Self-pay | Admitting: Family

## 2015-06-04 VITALS — BP 118/70 | HR 71 | Temp 98.2°F | Wt 146.0 lb

## 2015-06-04 DIAGNOSIS — S82402D Unspecified fracture of shaft of left fibula, subsequent encounter for closed fracture with routine healing: Secondary | ICD-10-CM | POA: Diagnosis not present

## 2015-06-04 DIAGNOSIS — S82402A Unspecified fracture of shaft of left fibula, initial encounter for closed fracture: Secondary | ICD-10-CM | POA: Insufficient documentation

## 2015-06-04 MED ORDER — HYDROCODONE-ACETAMINOPHEN 10-325 MG PO TABS: 1.0000 | ORAL_TABLET | Freq: Two times a day (BID) | ORAL | Status: DC | PRN

## 2015-06-04 NOTE — Assessment & Plan Note (Signed)
Close fibular fracture following fall currently maintained in ankle immobilizer. There is concern regarding patient compliance with regimen. There is also concern regarding multiple comorbidities affecting healing process including tobacco use and diabetes. Patient educated on proper crutch use as well as ice, compression, and elevation. There is no displacement of the fracture at this time. Continue conservative treatment and follow-up in 3 weeks for additional x-rays or referral to orthopedics.

## 2015-06-04 NOTE — Progress Notes (Signed)
Subjective:    Patient ID: Leslie Lane, female    DOB: Apr 22, 1956, 59 y.o.   MRN: [id number]  Chief Complaint  Patient presents with  . Ankle Injury    HPI:  Leslie Lane is a 59 y.o. female who  has a past medical history of HEPATITIS B; [place]; SMOKER; MIGRAINE HEADACHE; NECK PAIN, CHRONIC; LOW BACK PAIN, CHRONIC; ALLERGIC RHINITIS; HYPERLIPIDEMIA; GERD; DIABETES MELLITUS, TYPE II; DEPRESSION; NARCOTIC ABUSE; Anxiety; Arthritis; Fibromyalgia; and Diabetes mellitus type I ([place]). and presents today pressure follow-up office visit.  Recently evaluated in the emergency department following a fall down approximately 2 steps landing on her left foot and twisting her left foot landing on her right side. Denied any head trauma at the time or loss of consciousness. Chief complaint was moderate left ankle pain that is worse with weightbearing.x-rays revealed distal fibular fracture with note tibia or fibular fracture that would be consistent with [place] fracture. She was discharged with instructions for rest, ice, compression, and elevation and a prescription for oxycodone-acetaminophen. All ED records reviewed in detail.  Since leaving the hospital she has been elevating her leg and wearing an immobilizer. Continues to experience left lateral ankle pain and occasional medial pain as well. Pain is described as sharp and achy. She does have crutches at home but is not currently using them. Severity of the pain is about 7/10. Modifying factors of Percocet is taken as prescribed and denies adverse side effects, however does not adequately control her pain. Her mother reports that she has occasionally taken trying to walk on it with the boot.  Allergies  Allergen Reactions  . Penicillins Anaphylaxis  . Sulfonamide Derivatives Anaphylaxis     Current Outpatient Prescriptions on File Prior to Visit  Medication Sig Dispense Refill  . aspirin EC 81 MG tablet Take 81 mg by  mouth every morning.    . betamethasone valerate lotion (VALISONE) 0.1 % Apply 1 application topically 2 (two) times daily as needed for irritation. Apply to affected area  2  . Bioflavonoid Products (BIOFLEX PO) Take 1 tablet by mouth every morning.     . cholecalciferol (VITAMIN D) 1000 UNITS tablet Take 1,000 Units by mouth daily.    . diclofenac sodium (VOLTAREN) 1 % GEL Apply 2 g topically 4 (four) times daily. 100 g 3  . GLUCAGON EMERGENCY 1 MG injection INJECT 1 MG INTO THE SKIN AT ONCE AS NEEDED 2 kit 5  . hydrOXYzine (VISTARIL) 25 MG capsule Take 1 capsule (25 mg total) by mouth 3 (three) times daily. (Patient taking differently: Take 75 mg by mouth at bedtime. ) 90 capsule 1  . insulin lispro (HUMALOG) 100 UNIT/ML injection Sliding scale 10 units in am and 8 units at night (Patient taking differently: Inject 1-3 Units into the skin 2 (two) times daily as needed for high blood sugar. Sliding scale 10 units in am and 8 units at night) 30 mL 1  . lamoTRIgine (LAMICTAL) 25 MG tablet Take 3 tablets (75 mg total) by mouth daily. (Patient taking differently: Take 25-50 mg by mouth 2 (two) times daily. PATIENT TAKES 2 TABLETS IN THE MORNING AND 1 TABLET IN THE EVENING) 90 tablet 1  . LANTUS 100 UNIT/ML injection INJECT 9 UNITS INTO THE SKIN IN THE MORNING AS INSTRUCTED (DISCARD VIAL 28 DAYS AFTER OPENING) (Patient taking differently: INJECT 10 UNITS INTO THE SKIN EVERY MORNING AND 6 UNITS IN THE EVENING  AS INSTRUCTED (DISCARD VIAL 28 DAYS AFTER OPENING))  30 mL 1  . loratadine (CLARITIN) 10 MG tablet Take 10 mg by mouth as needed for allergies.    Marland Kitchen lovastatin (MEVACOR) 40 MG tablet TAKE 1 TABLET BY MOUTH AT BEDTIME 90 tablet 0  . Multiple Vitamin (MULTIVITAMIN) tablet Take 1 tablet by mouth daily.      Marland Kitchen neomycin-polymyxin b-dexamethasone (MAXITROL) 3.5-10000-0.1 SUSP Place 2 drops into the left eye 3 (three) times daily. (Patient taking differently: Place 2 drops into both ears daily as needed (FOR  EAR ITCHING). ) 5 mL 0  . omeprazole (PRILOSEC) 40 MG capsule Take 1 capsule (40 mg total) by mouth 2 (two) times daily. 60 capsule 6  . ONE TOUCH ULTRA TEST test strip USE  TO CHECK BLOOD SUGAR FOUR TIMES DAILY 400 each 1  . RABEprazole (ACIPHEX) 20 MG tablet Take 1 tablet (20 mg total) by mouth daily. 30 tablet 3  . traMADol (ULTRAM) 50 MG tablet Take 1 tablet (50 mg total) by mouth every 12 (twelve) hours as needed for moderate pain or severe pain. 60 tablet 0  . traZODone (DESYREL) 300 MG tablet Take 1 tablet (300 mg total) by mouth at bedtime. (Patient taking differently: Take 600 mg by mouth at bedtime. ) 30 tablet 1  . vitamin A 10000 UNIT capsule Take 10,000 Units by mouth daily.    . vitamin E (VITAMIN E) 1000 UNIT capsule Take 1,000 Units by mouth daily.      Marland Kitchen zinc gluconate 50 MG tablet Take 50 mg by mouth daily.     No current facility-administered medications on file prior to visit.    Past Medical History  Diagnosis Date  . HEPATITIS B   . BIPOLAR AFFECTIVE DISORDER   . SMOKER   . MIGRAINE HEADACHE   . NECK PAIN, CHRONIC   . LOW BACK PAIN, CHRONIC   . ALLERGIC RHINITIS   . HYPERLIPIDEMIA   . GERD   . DIABETES MELLITUS, TYPE II     follows with endo  . DEPRESSION   . NARCOTIC ABUSE     hx of  . Anxiety   . Arthritis   . Fibromyalgia   . Diabetes mellitus type I ([place])      Review of Systems  Musculoskeletal:       Positive for left ankle pain.  Neurological: Negative for weakness and numbness.      Objective:    BP 118/70 mmHg  Pulse 71  Temp(Src) 98.2 F (36.8 C)  Wt 146 lb (66.225 kg)  SpO2 98% Nursing note and vital signs reviewed.  Physical Exam  Constitutional: She is oriented to person, place, and time. She appears well-developed and well-nourished. No distress.  Cardiovascular: Normal rate, regular rhythm, normal heart sounds and intact distal pulses.   Pulmonary/Chest: Effort normal and breath sounds normal.  Musculoskeletal:  Left ankle -  moderate edema with no discoloration or deformity noted. Tenderness of lateral malleolus inferior and superior aspects. Range of motion is limited secondary to edema and pain. Distal pulses and sensation are intact and appropriate.  Neurological: She is alert and oriented to person, place, and time.  Skin: Skin is warm and dry.  Psychiatric: She has a normal mood and affect. Her behavior is normal. Judgment and thought content normal.       Assessment & Plan:   Problem List Items Addressed This Visit      Musculoskeletal and Integument   Closed fibular fracture - Primary    Close fibular fracture following fall currently  maintained in ankle immobilizer. There is concern regarding patient compliance with regimen. There is also concern regarding multiple comorbidities affecting healing process including tobacco use and diabetes. Patient educated on proper crutch use as well as ice, compression, and elevation. There is no displacement of the fracture at this time. Continue conservative treatment and follow-up in 3 weeks for additional x-rays or referral to orthopedics.      Relevant Medications   HYDROcodone-acetaminophen (NORCO) 10-325 MG tablet

## 2015-06-04 NOTE — Progress Notes (Signed)
Pre visit review using our clinic review tool, if applicable. No additional management support is needed unless otherwise documented below in the visit note. 

## 2015-06-04 NOTE — Patient Instructions (Signed)
Thank you for choosing [facility].  Summary/Instructions:  Your prescription(s) have been submitted to your pharmacy or been printed and provided for you. Please take as directed and contact our office if you believe you are having problem(s) with the medication(s) or have any questions.  If your symptoms worsen or fail to improve, please contact our office for further instruction, or in case of emergency go directly to the emergency room at the closest medical facility.   Crutch Use Crutches are used to take weight off one of your legs or feet when you stand or walk. It is important to use crutches that fit properly. When fitted properly:  Each crutch should be 2-3 finger widths below the armpit.  Your weight should be supported by your hand, and not by resting the armpit on the crutch. RISKS AND COMPLICATIONS Damage to the nerves that extend from your armpit to your hand and arm. To prevent this from happening, make sure your crutches fit properly and do not put pressure on your armpit when using them. HOW TO USE YOUR CRUTCHES If you have been instructed to use partial weight bearing, apply (bear) the amount of weight as your health care provider suggests. Do not bear weight in an amount that causes pain to the area of injury. Walking 1. Step with the crutches. 2. Swing the healthy leg slightly ahead of the crutches. Going Up Steps If there is no handrail: 1. Step up with the healthy leg. 2. Step up with the crutches and injured leg. 3. Continue in this way. If there is a handrail: 1. Hold both crutches in one hand. 2. Place your free hand on the handrail. 3. While putting your weight on your arms, lift your healthy leg to the step. 4. Bring the crutches and the injured leg up to that step. 5. Continue in this way. Going Down Steps Be very careful, as going down stairs with crutches is very challenging. If there is no handrail: 1. Step down with the injured leg and  crutches. 2. Step down with the healthy leg. If there is a handrail: 1. Place your hand on the handrail. 2. Hold both crutches with your free hand. 3. Lower your injured leg and crutch to the step below you. Make sure to keep the crutch tips in the center of the step, never on the edge. 4. Lower your healthy leg to that step. 5. Continue in this way. Standing Up 1. Hold the injured leg forward. 2. Grab the armrest with one hand and the top of the crutches with the other hand. 3. Using these supports, pull yourself up to a standing position. Sitting Down 1. Hold the injured leg forward. 2. Grab the armrest with one hand and the top of the crutches with the other hand. 3. Lower yourself to a sitting position. SEEK MEDICAL CARE IF:  You still feel unsteady on your feet.  You develop new pain, for example in your armpits, back, shoulder, wrist, or hip.  You develop any numbness or tingling. SEEK IMMEDIATE MEDICAL CARE IF:  You fall.   This information is not intended to replace advice given to you by your health care provider. Make sure you discuss any questions you have with your health care provider.   Document Released: 06/16/2000 Document Revised: 07/10/2014 Document Reviewed: 02/24/2013 Elsevier Interactive Patient Education [employer].

## 2015-06-06 ENCOUNTER — Other Ambulatory Visit (HOSPITAL_COMMUNITY): Payer: Self-pay | Admitting: Psychiatry

## 2015-06-12 ENCOUNTER — Other Ambulatory Visit (HOSPITAL_COMMUNITY): Payer: Self-pay | Admitting: Psychiatry

## 2015-06-14 ENCOUNTER — Other Ambulatory Visit (INDEPENDENT_AMBULATORY_CARE_PROVIDER_SITE_OTHER): Payer: Commercial Managed Care - HMO

## 2015-06-14 DIAGNOSIS — E785 Hyperlipidemia, unspecified: Secondary | ICD-10-CM

## 2015-06-14 DIAGNOSIS — E1065 Type 1 diabetes mellitus with hyperglycemia: Secondary | ICD-10-CM | POA: Diagnosis not present

## 2015-06-14 DIAGNOSIS — IMO0002 Reserved for concepts with insufficient information to code with codable children: Secondary | ICD-10-CM

## 2015-06-14 LAB — COMPREHENSIVE METABOLIC PANEL
ALK PHOS: 69 U/L (ref 39–117)
ALT: 11 U/L (ref 0–35)
AST: 19 U/L (ref 0–37)
Albumin: 3.9 g/dL (ref 3.5–5.2)
BILIRUBIN TOTAL: 0.2 mg/dL (ref 0.2–1.2)
BUN: 15 mg/dL (ref 6–23)
CO2: 31 meq/L (ref 19–32)
Calcium: 9.3 mg/dL (ref 8.4–10.5)
Chloride: 101 mEq/L (ref 96–112)
Creatinine, Ser: 0.92 mg/dL (ref 0.40–1.20)
GFR: 65.93 mL/min (ref >60.00)
GLUCOSE: 133 mg/dL — AB (ref 70–99)
Potassium: 4.9 mEq/L (ref 3.5–5.1)
SODIUM: 139 meq/L (ref 135–145)
TOTAL PROTEIN: 6.7 g/dL (ref 6.0–8.3)

## 2015-06-14 LAB — LIPID PANEL
CHOL/HDL RATIO: 3
Cholesterol: 154 mg/dL (ref 0–200)
HDL: 48.8 mg/dL (ref >39.00)
LDL Cholesterol: 82 mg/dL (ref 0–99)
NONHDL: 104.74
Triglycerides: 113 mg/dL (ref 0.0–149.0)
VLDL: 22.6 mg/dL (ref 0.0–40.0)

## 2015-06-15 ENCOUNTER — Ambulatory Visit (INDEPENDENT_AMBULATORY_CARE_PROVIDER_SITE_OTHER): Payer: Medicare HMO | Admitting: Psychiatry

## 2015-06-15 ENCOUNTER — Encounter (HOSPITAL_COMMUNITY): Payer: Self-pay | Admitting: Psychiatry

## 2015-06-15 VITALS — BP 131/68 | HR 86 | Ht 64.0 in | Wt 146.0 lb

## 2015-06-15 DIAGNOSIS — F332 Major depressive disorder, recurrent severe without psychotic features: Secondary | ICD-10-CM | POA: Diagnosis not present

## 2015-06-15 DIAGNOSIS — F4010 Social phobia, unspecified: Secondary | ICD-10-CM

## 2015-06-15 DIAGNOSIS — G4700 Insomnia, unspecified: Secondary | ICD-10-CM | POA: Diagnosis not present

## 2015-06-15 DIAGNOSIS — F17210 Nicotine dependence, cigarettes, uncomplicated: Secondary | ICD-10-CM

## 2015-06-15 MED ORDER — LAMOTRIGINE 25 MG PO TABS: ORAL_TABLET | ORAL | Status: DC

## 2015-06-15 MED ORDER — TRAZODONE HCL 300 MG PO TABS: 300.0000 mg | ORAL_TABLET | Freq: Every day | ORAL | Status: DC
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
## 2015-06-17 ENCOUNTER — Ambulatory Visit (INDEPENDENT_AMBULATORY_CARE_PROVIDER_SITE_OTHER): Payer: Commercial Managed Care - HMO | Admitting: Endocrinology

## 2015-06-17 ENCOUNTER — Encounter: Payer: Self-pay | Admitting: Endocrinology

## 2015-06-17 VITALS — BP 110/64 | HR 82 | Temp 98.4°F | Resp 14 | Ht 64.0 in | Wt 147.2 lb

## 2015-06-17 DIAGNOSIS — Z780 Asymptomatic menopausal state: Secondary | ICD-10-CM | POA: Diagnosis not present

## 2015-06-17 DIAGNOSIS — E785 Hyperlipidemia, unspecified: Secondary | ICD-10-CM

## 2015-06-17 DIAGNOSIS — E1065 Type 1 diabetes mellitus with hyperglycemia: Secondary | ICD-10-CM | POA: Diagnosis not present

## 2015-06-17 DIAGNOSIS — S82401D Unspecified fracture of shaft of right fibula, subsequent encounter for closed fracture with routine healing: Secondary | ICD-10-CM

## 2015-06-17 NOTE — Patient Instructions (Signed)
LANTUS 4 UNITS at nite
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
## 2015-06-23 ENCOUNTER — Encounter: Payer: Self-pay | Admitting: Internal Medicine

## 2015-06-23 ENCOUNTER — Ambulatory Visit (INDEPENDENT_AMBULATORY_CARE_PROVIDER_SITE_OTHER): Payer: Commercial Managed Care - HMO | Admitting: Internal Medicine

## 2015-06-23 ENCOUNTER — Ambulatory Visit (INDEPENDENT_AMBULATORY_CARE_PROVIDER_SITE_OTHER)
Admission: RE | Admit: 2015-06-23 | Discharge: 2015-06-23 | Disposition: A | Discharge status: Home / Self Care | Payer: Commercial Managed Care - HMO | Source: Ambulatory Visit | Attending: Internal Medicine | Admitting: Internal Medicine

## 2015-06-23 VITALS — BP 118/58 | HR 101 | Temp 98.3°F | Resp 14 | Ht 64.0 in | Wt 143.8 lb

## 2015-06-23 DIAGNOSIS — M7989 Other specified soft tissue disorders: Secondary | ICD-10-CM | POA: Diagnosis not present

## 2015-06-23 DIAGNOSIS — S82401D Unspecified fracture of shaft of right fibula, subsequent encounter for closed fracture with routine healing: Secondary | ICD-10-CM

## 2015-06-23 DIAGNOSIS — S82402D Unspecified fracture of shaft of left fibula, subsequent encounter for closed fracture with routine healing: Secondary | ICD-10-CM

## 2015-06-23 MED ORDER — PROMETHAZINE HCL 12.5 MG PO TABS: 12.5000 mg | ORAL_TABLET | Freq: Three times a day (TID) | ORAL | Status: DC | PRN

## 2015-06-23 MED ORDER — GABAPENTIN 300 MG PO CAPS: 300.0000 mg | ORAL_CAPSULE | Freq: Two times a day (BID) | ORAL | Status: DC

## 2015-06-23 NOTE — Patient Instructions (Signed)
We will check the x-ray and if it looks better you can take off the splint.

## 2015-06-23 NOTE — Assessment & Plan Note (Addendum)
She is ambulating with boot now, less pain and less swelling. Checking x-ray today for healing. If healing she can remove splint and return to light activity and then increase as tolerated. Ordered bone density as she does not have one in the past.

## 2015-06-23 NOTE — Progress Notes (Signed)
   Subjective:    Patient ID: Leslie Lane, female    DOB: 1955/07/30, 59 y.o.   MRN: [id number]  HPI The patient is a 59 YO female coming in for non-displaced fracture of the left fibula. She was initially immobilized and was using crutches. She is now walking in a boot. Still having some pain and mild swelling. Doing well and walking well.   Review of Systems  Constitutional: Negative.   Respiratory: Negative.   Gastrointestinal: Negative.   Musculoskeletal: Positive for arthralgias and gait problem. Negative for myalgias and back pain.  Skin: Negative.   Neurological: Negative.       Objective:   Physical Exam  Constitutional: She is oriented to person, place, and time. She appears well-developed and well-nourished.  HENT:  Head: Normocephalic and atraumatic.  Cardiovascular: Normal rate.   Pulmonary/Chest: Effort normal and breath sounds normal.  Abdominal: Soft. She exhibits no distension. There is no tenderness.  Musculoskeletal: She exhibits no edema.  Neurological: She is alert and oriented to person, place, and time. Coordination normal.  Splint on the left ankle and to mid shin, no swelling  Skin: Skin is warm and dry.   Filed Vitals:   06/23/15 1108  BP: 118/58  Pulse: 101  Temp: 98.3 F (36.8 C)  TempSrc: Oral  Resp: 14  Height: 5\' 4"  (1.626 m)  Weight: 143 lb 12.8 oz (65.227 kg)  SpO2: 97%      Assessment & Plan:

## 2015-06-23 NOTE — Progress Notes (Signed)
Pre visit review using our clinic review tool, if applicable. No additional management support is needed unless otherwise documented below in the visit note. 

## 2015-06-24 ENCOUNTER — Telehealth: Payer: Self-pay | Admitting: Family

## 2015-06-24 NOTE — Telephone Encounter (Signed)
Patient aware and will wear the boot for two weeks and progress out of it.

## 2015-06-24 NOTE — Telephone Encounter (Signed)
Please inform patient that her x-ray is improved from previous and is healing slowly. I would recommend an additional 2 weeks in the boot and then may progress out of it.

## 2015-07-05 ENCOUNTER — Other Ambulatory Visit: Payer: Self-pay | Admitting: Internal Medicine

## 2015-07-06 ENCOUNTER — Other Ambulatory Visit: Payer: Self-pay

## 2015-07-24 ENCOUNTER — Other Ambulatory Visit: Payer: Self-pay | Admitting: Internal Medicine

## 2015-08-11 ENCOUNTER — Other Ambulatory Visit: Payer: Self-pay | Admitting: Endocrinology

## 2015-08-13 ENCOUNTER — Other Ambulatory Visit (INDEPENDENT_AMBULATORY_CARE_PROVIDER_SITE_OTHER): Payer: Commercial Managed Care - HMO

## 2015-08-13 DIAGNOSIS — E1065 Type 1 diabetes mellitus with hyperglycemia: Secondary | ICD-10-CM

## 2015-08-13 DIAGNOSIS — Z780 Asymptomatic menopausal state: Secondary | ICD-10-CM | POA: Diagnosis not present

## 2015-08-13 DIAGNOSIS — S82401D Unspecified fracture of shaft of right fibula, subsequent encounter for closed fracture with routine healing: Secondary | ICD-10-CM

## 2015-08-13 LAB — COMPREHENSIVE METABOLIC PANEL
ALBUMIN: 3.8 g/dL (ref 3.5–5.2)
ALT: 14 U/L (ref 0–35)
AST: 21 U/L (ref 0–37)
Alkaline Phosphatase: 61 U/L (ref 39–117)
BUN: 13 mg/dL (ref 6–23)
CALCIUM: 9.5 mg/dL (ref 8.4–10.5)
CHLORIDE: 103 meq/L (ref 96–112)
CO2: 34 meq/L — AB (ref 19–32)
Creatinine, Ser: 0.76 mg/dL (ref 0.40–1.20)
GFR: 82.15 mL/min (ref >60.00)
Glucose, Bld: 69 mg/dL — ABNORMAL LOW (ref 70–99)
POTASSIUM: 4.9 meq/L (ref 3.5–5.1)
Sodium: 139 mEq/L (ref 135–145)
Total Bilirubin: 0.3 mg/dL (ref 0.2–1.2)
Total Protein: 6.8 g/dL (ref 6.0–8.3)

## 2015-08-13 LAB — HEMOGLOBIN A1C: Hgb A1c MFr Bld: 6.6 % — ABNORMAL HIGH (ref 4.6–6.5)

## 2015-08-13 LAB — VITAMIN D 25 HYDROXY (VIT D DEFICIENCY, FRACTURES): VITD: 62.56 ng/mL (ref 30.00–100.00)

## 2015-08-16 ENCOUNTER — Encounter: Payer: Self-pay | Admitting: Endocrinology

## 2015-08-16 ENCOUNTER — Encounter (INDEPENDENT_AMBULATORY_CARE_PROVIDER_SITE_OTHER): Payer: Commercial Managed Care - HMO | Admitting: Endocrinology

## 2015-08-17 ENCOUNTER — Other Ambulatory Visit: Payer: Self-pay | Admitting: Endocrinology

## 2015-08-17 NOTE — Progress Notes (Signed)
This encounter was created in error - please disregard.

## 2015-08-18 ENCOUNTER — Ambulatory Visit: Payer: Self-pay | Admitting: Endocrinology

## 2015-08-18 ENCOUNTER — Other Ambulatory Visit: Payer: Self-pay | Admitting: *Deleted

## 2015-08-18 MED ORDER — GLUCOSE BLOOD VI STRP: ORAL_STRIP | Status: DC

## 2015-08-18 MED ORDER — ACCU-CHEK FASTCLIX LANCETS MISC: Status: DC

## 2015-08-18 MED ORDER — ACCU-CHEK AVIVA PLUS W/DEVICE KIT: PACK | Status: DC

## 2015-08-20 ENCOUNTER — Ambulatory Visit (INDEPENDENT_AMBULATORY_CARE_PROVIDER_SITE_OTHER): Payer: Commercial Managed Care - HMO | Admitting: Endocrinology

## 2015-08-20 ENCOUNTER — Encounter: Payer: Self-pay | Admitting: Endocrinology

## 2015-08-20 VITALS — BP 120/68 | HR 91 | Temp 97.8°F | Resp 14 | Ht 64.0 in | Wt 140.8 lb

## 2015-08-20 DIAGNOSIS — E1065 Type 1 diabetes mellitus with hyperglycemia: Secondary | ICD-10-CM | POA: Diagnosis not present

## 2015-08-20 NOTE — Progress Notes (Signed)
Patient ID: Leslie Lane, female   DOB: 1956/03/18, 60 y.o.   MRN: [id number]   Reason for Appointment: Diabetes follow-up   History of Present Illness   Diagnosis: Type 1 DIABETES MELITUS, diagnosed 1967     Insulin regimen: Lantus 10 units a.m., 4 units h.s. Humalog 1-2 p.r.n. With vial  She has had labile blood sugar control over the last several years even though A1c has been usually around 7% She has had less lability and hypoglycemia with taking b.i.d. Lantus compared once a day She has been very sensitive to fast acting insulin and frequently does not require mealtime coverage  RECENT history: She is  taking Lantus twice a day with smaller amount in the evening Although her A1c is still fairly good at 6.6 her recent blood sugar patterns have been very labile   Current blood sugar patterns and problems identified:  She was having significant and fairly consistent hypoglycemia at several times of the day about 2 weeks ago until this Tuesday  This was without any change in diet or activity level  Her last 3 days of blood sugars have started going higher consistently throughout the day with several readings over 211 fasting.  She thinks this is from her getting more depressed and eating all the time especially late at night  She is the last 2 days taking extra Humalog insulin more often than usual without her sugars dropping much  She is not very active now  Her blood sugars in the last 2 weeks 31% burden hypoglycemic range  HYPOGLYCEMIA: As above, may not have symptoms with low blood sugars and does not have early warning symptoms. Gets confused frequently and she depends on her mother to recognize low sugars and treat them. Her mother knows how to give Glucagon injection She will treat her low blood sugars usually with juice, does not like glucose tablets; sometimes will eat food at the same time      DIET: She is usually not eating balanced meals although  trying to get protein with eggs in the morning Meal times: dinner 5-6 p.m. Less snacks like chips, ice cream and popcorn  .  She will eat more when she is anxious, less now.  Monitors blood glucose:  about 4-5 times a day.    Glucometer: One Touch.      Blood Glucose readings from meter download as follows   Mean values apply above for all meters except median for One Touch  PRE-MEAL Fasting Lunch Dinner Bedtime Overall  Glucose range:  31-291   41-270   44-242   35-301    Mean/median:  77   98   103   131   102+/-82     Wt Readings from Last 3 Encounters:  08/20/15 140 lb 12.8 oz (63.866 kg)  08/16/15 147 lb 9.6 oz (66.951 kg)  06/23/15 143 lb 12.8 oz (65.227 kg)    Lab Results  Component Value Date   HGBA1C 6.6* 08/13/2015   HGBA1C 6.7 05/13/2015   HGBA1C 7.9* 02/05/2015   Lab Results  Component Value Date   MICROALBUR <0.7 02/05/2015   LDLCALC 82 06/14/2015   CREATININE 0.76 08/13/2015          Medication List       This list is accurate as of: 08/20/15 11:59 PM.  Always use your most recent med list.               ACCU-CHEK AVIVA PLUS w/Device Kit  Use to check blood sugar 4 times daily dx code E10.65     ACCU-CHEK FASTCLIX LANCETS Misc  Use to check blood sugar 4 times per day dx code E10.65     aspirin EC 81 MG tablet  Take 81 mg by mouth every morning.     betamethasone valerate lotion 0.1 %  Commonly known as:  VALISONE  Apply 1 application topically 2 (two) times daily as needed for irritation. Apply to affected area     BIOFLEX PO  Take 1 tablet by mouth every morning.     cholecalciferol 1000 units tablet  Commonly known as:  VITAMIN D  Take 1,000 Units by mouth daily.     diclofenac sodium 1 % Gel  Commonly known as:  VOLTAREN  Apply 2 g topically 4 (four) times daily.     gabapentin 300 MG capsule  Commonly known as:  NEURONTIN  Take 1 capsule (300 mg total) by mouth 2 (two) times daily.     GLUCAGON EMERGENCY 1 MG injection    Generic drug:  glucagon  INJECT 1 MG INTO THE SKIN AT ONCE AS NEEDED     glucose blood test strip  Commonly known as:  ACCU-CHEK AVIVA PLUS  Use as instructed to check blood sugar 4 times per day dx code E10.65     HYDROcodone-acetaminophen 10-325 MG tablet  Commonly known as:  NORCO  Take 1 tablet by mouth 2 (two) times daily as needed.     insulin lispro 100 UNIT/ML injection  Commonly known as:  HUMALOG  Sliding scale 10 units in am and 8 units at night     lamoTRIgine 25 MG tablet  Commonly known as:  LAMICTAL  TAKE 3 TABLETS(75 MG) BY MOUTH DAILY     LANTUS 100 UNIT/ML injection  Generic drug:  insulin glargine  INJECT 9 UNITS INTO THE SKIN IN THE MORNING AS INSTRUCTED (DISCARD VIAL 28 DAYS AFTER OPENING)     loratadine 10 MG tablet  Commonly known as:  CLARITIN  Take 10 mg by mouth as needed for allergies.     lovastatin 40 MG tablet  Commonly known as:  MEVACOR  TAKE 1 TABLET BY MOUTH EVERY NIGHT AT BEDTIME     multivitamin tablet  Take 1 tablet by mouth daily.     neomycin-polymyxin b-dexamethasone 3.5-10000-0.1 Susp  Commonly known as:  MAXITROL  Place 2 drops into the left eye 3 (three) times daily.     omeprazole 40 MG capsule  Commonly known as:  PRILOSEC  Take 1 capsule (40 mg total) by mouth 2 (two) times daily.     promethazine 12.5 MG tablet  Commonly known as:  PHENERGAN  Take 1 tablet (12.5 mg total) by mouth every 8 (eight) hours as needed for nausea or vomiting.     RABEprazole 20 MG tablet  Commonly known as:  ACIPHEX  Take 1 tablet (20 mg total) by mouth daily.     traMADol 50 MG tablet  Commonly known as:  ULTRAM  TAKE 1 TABLET BY MOUTH EVERY 12 HOURS AS NEEDED FOR MODERATE OR SEVERE PAIN     trazodone 300 MG tablet  Commonly known as:  DESYREL  Take 1 tablet (300 mg total) by mouth at bedtime.     vitamin A 10000 UNIT capsule  Take 10,000 Units by mouth daily.     vitamin E 1000 UNIT capsule  Generic drug:  vitamin E  Take 1,000  Units by mouth daily.  zinc gluconate 50 MG tablet  Take 50 mg by mouth daily.        Allergies:  Allergies  Allergen Reactions  . Penicillins Anaphylaxis  . Sulfonamide Derivatives Anaphylaxis    Past Medical History  Diagnosis Date  . HEPATITIS B   . BIPOLAR AFFECTIVE DISORDER   . SMOKER   . MIGRAINE HEADACHE   . NECK PAIN, CHRONIC   . LOW BACK PAIN, CHRONIC   . ALLERGIC RHINITIS   . HYPERLIPIDEMIA   . GERD   . DIABETES MELLITUS, TYPE II     follows with endo  . DEPRESSION   . NARCOTIC ABUSE     hx of  . Anxiety   . Arthritis   . Fibromyalgia   . Diabetes mellitus type I ([place])   . Neuropathy [facility])     Past Surgical History  Procedure Laterality Date  . Cesarean section      Family History  Problem Relation Age of Onset  . Arthritis Mother   . Arthritis Father   . Kidney disease Father   . Kidney cancer Father   . Hyperlipidemia Other   . Kidney cancer Paternal Aunt   . Heart attack Brother   . Bladder Cancer Sister   . Anxiety disorder Neg Hx   . Bipolar disorder Neg Hx   . Depression Neg Hx     Social History:  reports that she has been smoking Cigarettes and E-cigarettes.  She has a 20 pack-year smoking history. She has never used smokeless tobacco. She reports that she drinks alcohol. She reports that she does not use illicit drugs.  Review of Systems:   DEPRESSION: She has had long-standing depression and anxiety  She continues to have depression, insomnia and anxiety She now says that she is having poor control of her depression and also trazodone does not help sleep  HYPERLIPIDEMIA: The lipid abnormality consists of elevated LDL   Is  on lovastatin 40 mg with good control   Lab Results  Component Value Date   CHOL 154 06/14/2015   HDL 48.80 06/14/2015   LDLCALC 82 06/14/2015   LDLDIRECT 101.7 03/26/2012   TRIG 113.0 06/14/2015   CHOLHDL 3 06/14/2015     Has history of small goiter but no hypothyroidism in the past  Lab  Results  Component Value Date   TSH 0.78 04/27/2014        Examination:   BP 120/68 mmHg  Pulse 91  Temp(Src) 97.8 F (36.6 C)  Resp 14  Ht _0  (1.626 m)  Wt 140 lb 12.8 oz (63.866 kg)  BMI 24.16 kg/m2  SpO2 97%  Body mass index is 24.16 kg/(m^2).    ASSESSMENT/ PLAN:   Diabetes type 1 See history of present illness for detailed discussion of current insulin, blood sugar patterns, problems identified Her blood sugars had been frequently low until 3 days ago especially in the mornings and afternoons Now with her having issues with depression and insomnia she is eating larger snacks and meals and even during the night causing fairly high blood sugars throughout the day requiring some Humalog also No consistent pattern at this time  Although she appears to be needing more basal insulin is 1 last few days will only go up 1 unit on the morning dose and not in the evening because of previous tendency to hypoglycemia She will continue to take Humalog as needed also She will call if she has inconsistent readings after having her depression and insomnia  sorted out Advised her to reduce carbohydrate intake and have some protein consistently Also encouraged her to start walking for exercise  ?  Osteoporosis: She is still awaiting her bone density  Counseling time on subjects discussed above is over 50% of today's 25 minute visit  Patient Instructions  Increase am Lantus to 11 or 12 and keep pm to 4       Yandiel Bergum 08/22/2015, 9:05 PM   -

## 2015-08-20 NOTE — Patient Instructions (Signed)
Increase am Lantus to 11 or 12 and keep pm to 4

## 2015-08-23 ENCOUNTER — Telehealth (HOSPITAL_COMMUNITY): Payer: Self-pay

## 2015-08-23 DIAGNOSIS — F332 Major depressive disorder, recurrent severe without psychotic features: Secondary | ICD-10-CM

## 2015-08-24 NOTE — Telephone Encounter (Signed)
Called and left voice message for call back to clinic when available.

## 2015-08-25 ENCOUNTER — Telehealth: Payer: Self-pay | Admitting: Endocrinology

## 2015-08-25 NOTE — Telephone Encounter (Signed)
Patient stated that [employer] denied her Accu Check Fastclix, and she is almost out. Please advise

## 2015-08-26 ENCOUNTER — Other Ambulatory Visit: Payer: Self-pay

## 2015-08-26 MED ORDER — ONETOUCH DELICA LANCETS 33G MISC: Status: DC

## 2015-08-26 MED ORDER — LAMOTRIGINE 100 MG PO TABS: ORAL_TABLET | ORAL | Status: DC

## 2015-08-26 MED ORDER — ONETOUCH DELICA LANCING DEV MISC: Status: DC

## 2015-08-26 NOTE — Telephone Encounter (Signed)
She can use the Delica lancet device and lancets, checking 4 times a day

## 2015-08-26 NOTE — Telephone Encounter (Signed)
Delica device and lancets sent. Thanks.

## 2015-08-26 NOTE — Telephone Encounter (Signed)
Pt states 3-4 weeks she began feeling very depressed with crying, low motivation and isolation. Denies SI/HI. States meds are no longer working.   A/P: MDD- recurrent, severe without psychotic features, Social anxiety disorder,  1. Increase Lamictal to 100mg  po qD  F/up with Dr. Doyne Keel 09/14/15   Pt verbalized understanding and agreed to the plan.

## 2015-08-26 NOTE — Telephone Encounter (Signed)
See below

## 2015-08-26 NOTE — Addendum Note (Signed)
Addended by: Charlcie Cradle on: 08/26/2015 11:53 AM   Modules accepted: Orders

## 2015-08-31 ENCOUNTER — Encounter: Payer: Self-pay | Admitting: Internal Medicine

## 2015-08-31 ENCOUNTER — Telehealth: Payer: Self-pay | Admitting: Endocrinology

## 2015-08-31 ENCOUNTER — Ambulatory Visit (INDEPENDENT_AMBULATORY_CARE_PROVIDER_SITE_OTHER): Payer: Commercial Managed Care - HMO | Admitting: Internal Medicine

## 2015-08-31 ENCOUNTER — Ambulatory Visit (INDEPENDENT_AMBULATORY_CARE_PROVIDER_SITE_OTHER)
Admission: RE | Admit: 2015-08-31 | Discharge: 2015-08-31 | Disposition: A | Discharge status: Home / Self Care | Payer: Commercial Managed Care - HMO | Source: Ambulatory Visit | Attending: Endocrinology | Admitting: Endocrinology

## 2015-08-31 VITALS — BP 140/60 | HR 77 | Temp 98.3°F | Resp 14 | Ht 64.0 in | Wt 143.4 lb

## 2015-08-31 DIAGNOSIS — Z780 Asymptomatic menopausal state: Secondary | ICD-10-CM | POA: Diagnosis not present

## 2015-08-31 DIAGNOSIS — S82402D Unspecified fracture of shaft of left fibula, subsequent encounter for closed fracture with routine healing: Secondary | ICD-10-CM

## 2015-08-31 DIAGNOSIS — S82401D Unspecified fracture of shaft of right fibula, subsequent encounter for closed fracture with routine healing: Secondary | ICD-10-CM

## 2015-08-31 MED ORDER — GABAPENTIN 100 MG PO CAPS: 200.0000 mg | ORAL_CAPSULE | Freq: Two times a day (BID) | ORAL | Status: DC

## 2015-08-31 MED ORDER — OMEPRAZOLE 40 MG PO CPDR: 40.0000 mg | DELAYED_RELEASE_CAPSULE | Freq: Two times a day (BID) | ORAL | Status: DC

## 2015-08-31 NOTE — Telephone Encounter (Signed)
Insurance is now only covering accucheck aviva machine and now she needs the test strips and lancets please

## 2015-08-31 NOTE — Patient Instructions (Signed)
We have sent in the lower dose gabapentin. Start with 1 pill at night time for the first 3 days. Then you can increase to 1 pill twice a day for the next 3 days. Then you can increase to 1 pill in the morning and 2 pills at night time if you need it for next 3 days. Then you can increase to 2 pills in the morning and 2 pills at night time. If you are still having pain after being on that please call the office for advice.   We are checking the bone density test today and will call back with the results.

## 2015-08-31 NOTE — Progress Notes (Signed)
Pre visit review using our clinic review tool, if applicable. No additional management support is needed unless otherwise documented below in the visit note. 

## 2015-09-01 ENCOUNTER — Other Ambulatory Visit: Payer: Self-pay | Admitting: *Deleted

## 2015-09-01 ENCOUNTER — Other Ambulatory Visit: Payer: Self-pay | Admitting: Endocrinology

## 2015-09-01 MED ORDER — ACCU-CHEK SOFT TOUCH LANCETS MISC: Status: DC

## 2015-09-01 MED ORDER — GLUCOSE BLOOD VI STRP: ORAL_STRIP | Status: DC

## 2015-09-01 NOTE — Telephone Encounter (Signed)
rx has been sent 

## 2015-09-01 NOTE — Telephone Encounter (Signed)
Pt called and said that her pharmacy told her that the [place] would now be the covered meter and said she needs an Rx sent to the [facility] for the test strips. Fax# [phone]

## 2015-09-02 NOTE — Progress Notes (Signed)
Quick Note:  Please let patient know that the bone density is in between normal and osteoporosis, no treatment at this time ______

## 2015-09-03 ENCOUNTER — Telehealth: Payer: Self-pay | Admitting: *Deleted

## 2015-09-03 ENCOUNTER — Other Ambulatory Visit: Payer: Self-pay | Admitting: *Deleted

## 2015-09-03 MED ORDER — GLUCOSE BLOOD VI STRP: ORAL_STRIP | Status: DC

## 2015-09-03 MED ORDER — ACCU-CHEK SOFTCLIX LANCETS MISC: Status: DC

## 2015-09-03 MED ORDER — ACCU-CHEK AVIVA PLUS W/DEVICE KIT: PACK | Status: DC

## 2015-09-03 NOTE — Assessment & Plan Note (Signed)
At this time healed, she has not gotten bone density and we have arranged for her to have it right after the visit. She will do that. Rx for gabapentin 100 mg and given titration instructions to increase to maximum 300 mg BID.

## 2015-09-03 NOTE — Progress Notes (Signed)
   Subjective:    Patient ID: Leslie Lane, female    DOB: 03-Oct-1955, 60 y.o.   MRN: [id number]  HPI The patient is a 60 YO female coming in for follow up of her left ankle fracture. She has not gotten the bone density test done yet. She forgot about it. She is still having some pain and mild swelling in the ankle. Is back in normal shoes and able to walk. No weakness. Was taking gabapentin but 300 mg was too strong and she wants to know if there is a lower dose.   Review of Systems  Constitutional: Negative.   Respiratory: Negative.   Gastrointestinal: Negative.   Musculoskeletal: Positive for arthralgias. Negative for myalgias, back pain and gait problem.  Skin: Negative.   Neurological: Negative.   Psychiatric/Behavioral: Negative.       Objective:   Physical Exam  Constitutional: She is oriented to person, place, and time. She appears well-developed and well-nourished.  HENT:  Head: Normocephalic and atraumatic.  Cardiovascular: Normal rate.   Pulmonary/Chest: Effort normal and breath sounds normal.  Abdominal: Soft. She exhibits no distension. There is no tenderness.  Musculoskeletal: She exhibits no edema.  Neurological: She is alert and oriented to person, place, and time. Coordination normal.  Skin: Skin is warm and dry.   Filed Vitals:   08/31/15 1037  BP: 140/60  Pulse: 77  Temp: 98.3 F (36.8 C)  TempSrc: Oral  Resp: 14  Height: 5\' 4"  (1.626 m)  Weight: 143 lb 6.4 oz (65.046 kg)  SpO2: 97%      Assessment & Plan:

## 2015-09-11 ENCOUNTER — Other Ambulatory Visit (HOSPITAL_COMMUNITY): Payer: Self-pay | Admitting: Psychiatry

## 2015-09-14 ENCOUNTER — Ambulatory Visit (INDEPENDENT_AMBULATORY_CARE_PROVIDER_SITE_OTHER): Payer: Medicare HMO | Admitting: Psychiatry

## 2015-09-14 ENCOUNTER — Encounter (HOSPITAL_COMMUNITY): Payer: Self-pay | Admitting: Psychiatry

## 2015-09-14 VITALS — BP 138/83 | HR 77 | Ht 64.0 in | Wt 140.2 lb

## 2015-09-14 DIAGNOSIS — F332 Major depressive disorder, recurrent severe without psychotic features: Secondary | ICD-10-CM | POA: Diagnosis not present

## 2015-09-14 DIAGNOSIS — F4010 Social phobia, unspecified: Secondary | ICD-10-CM | POA: Diagnosis not present

## 2015-09-14 DIAGNOSIS — G4700 Insomnia, unspecified: Secondary | ICD-10-CM | POA: Diagnosis not present

## 2015-09-14 MED ORDER — LAMOTRIGINE 25 MG PO TABS: 125.0000 mg | ORAL_TABLET | Freq: Every day | ORAL | Status: DC

## 2015-09-14 MED ORDER — TRAZODONE HCL 300 MG PO TABS: 450.0000 mg | ORAL_TABLET | Freq: Every day | ORAL | Status: DC
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
## 2015-09-15 DIAGNOSIS — Z7251 High risk heterosexual behavior: Secondary | ICD-10-CM | POA: Diagnosis not present

## 2015-09-15 DIAGNOSIS — Z01419 Encounter for gynecological examination (general) (routine) without abnormal findings: Secondary | ICD-10-CM | POA: Diagnosis not present

## 2015-09-21 ENCOUNTER — Telehealth (HOSPITAL_COMMUNITY): Payer: Self-pay

## 2015-09-21 NOTE — Telephone Encounter (Signed)
Patient is calling, she states the Lamictal is upseting her stomach and making her sleepy. She said she has been so sleepy and she can not fight it. She wants to stop taking it and would like to know if you reccommed anything else. Please advise, thank you

## 2015-09-21 NOTE — Telephone Encounter (Signed)
Tell pt to decrease Lamictal to 100mg  qD. Do not stop it completely as it can cause a serious rash.

## 2015-09-24 ENCOUNTER — Telehealth: Payer: Self-pay | Admitting: Internal Medicine

## 2015-09-24 NOTE — Telephone Encounter (Signed)
I called patient to tell her to decrease the Trazodone - per a conversation with Dr. Doyne Keel after she had answered my message. Patient informed me that she was already taking the lower dose. I spoke with Dr. Doyne Keel again and she said to have patient go ahead and decrease the Lamictal. I called the patient this morning and she stated that she was not feeling well and she thinks it is the Trazodone, she has stopped taking it and will discuss at her next appointment. She will decrease the lamictal as well.

## 2015-10-21 ENCOUNTER — Other Ambulatory Visit: Payer: Self-pay | Admitting: Internal Medicine

## 2015-10-24 ENCOUNTER — Other Ambulatory Visit: Payer: Self-pay | Admitting: Internal Medicine

## 2015-11-12 ENCOUNTER — Other Ambulatory Visit (INDEPENDENT_AMBULATORY_CARE_PROVIDER_SITE_OTHER): Payer: Commercial Managed Care - HMO

## 2015-11-12 DIAGNOSIS — E1065 Type 1 diabetes mellitus with hyperglycemia: Secondary | ICD-10-CM | POA: Diagnosis not present

## 2015-11-12 LAB — BASIC METABOLIC PANEL
BUN: 13 mg/dL (ref 6–23)
CALCIUM: 9.5 mg/dL (ref 8.4–10.5)
CO2: 32 meq/L (ref 19–32)
CREATININE: 0.74 mg/dL (ref 0.40–1.20)
Chloride: 104 mEq/L (ref 96–112)
GFR: 84.64 mL/min (ref >60.00)
GLUCOSE: 59 mg/dL — AB (ref 70–99)
Potassium: 4.4 mEq/L (ref 3.5–5.1)
Sodium: 142 mEq/L (ref 135–145)

## 2015-11-12 LAB — TSH: TSH: 2.47 u[IU]/mL (ref 0.35–4.50)

## 2015-11-12 LAB — HEMOGLOBIN A1C: HEMOGLOBIN A1C: 7 % — AB (ref 4.6–6.5)

## 2015-11-17 ENCOUNTER — Ambulatory Visit (INDEPENDENT_AMBULATORY_CARE_PROVIDER_SITE_OTHER): Payer: Commercial Managed Care - HMO | Admitting: Endocrinology

## 2015-11-17 ENCOUNTER — Encounter: Payer: Self-pay | Admitting: Endocrinology

## 2015-11-17 VITALS — BP 112/52 | HR 70 | Temp 98.0°F | Resp 14 | Ht 64.0 in | Wt 136.4 lb

## 2015-11-17 DIAGNOSIS — E1065 Type 1 diabetes mellitus with hyperglycemia: Secondary | ICD-10-CM | POA: Diagnosis not present

## 2015-11-17 NOTE — Patient Instructions (Addendum)
Reduce Lantus to 9 in am and 3 in pm  Extra Carbs or juice when more active  Reduce smoking  Extra Gabapentin for burning in feet
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
## 2015-11-22 ENCOUNTER — Encounter: Payer: Self-pay | Admitting: Internal Medicine

## 2015-11-22 ENCOUNTER — Ambulatory Visit (INDEPENDENT_AMBULATORY_CARE_PROVIDER_SITE_OTHER): Payer: Commercial Managed Care - HMO | Admitting: Internal Medicine

## 2015-11-22 ENCOUNTER — Ambulatory Visit (INDEPENDENT_AMBULATORY_CARE_PROVIDER_SITE_OTHER)
Admission: RE | Admit: 2015-11-22 | Discharge: 2015-11-22 | Disposition: A | Discharge status: Home / Self Care | Payer: Commercial Managed Care - HMO | Source: Ambulatory Visit | Attending: Internal Medicine | Admitting: Internal Medicine

## 2015-11-22 VITALS — BP 120/62 | HR 82 | Temp 99.1°F | Resp 18 | Ht 64.0 in | Wt 137.4 lb

## 2015-11-22 DIAGNOSIS — S82402S Unspecified fracture of shaft of left fibula, sequela: Secondary | ICD-10-CM | POA: Diagnosis not present

## 2015-11-22 DIAGNOSIS — S82832A Other fracture of upper and lower end of left fibula, initial encounter for closed fracture: Secondary | ICD-10-CM | POA: Diagnosis not present

## 2015-11-22 MED ORDER — GABAPENTIN 100 MG PO CAPS: 100.0000 mg | ORAL_CAPSULE | Freq: Two times a day (BID) | ORAL | Status: DC

## 2015-11-22 NOTE — Patient Instructions (Signed)
We have sent in the gabapentin 100 mg capsules to use during the day as they may help without making you sleepy. It is okay for you to use the 300 mg gabapentin in the evening as it is okay if you get sleepy then.   We are checking an x-ray today to look for healing and check for any problems with the ankle.

## 2015-11-22 NOTE — Progress Notes (Signed)
Pre visit review using our clinic review tool, if applicable. No additional management support is needed unless otherwise documented below in the visit note. 

## 2015-11-23 ENCOUNTER — Ambulatory Visit (HOSPITAL_COMMUNITY): Payer: Self-pay | Admitting: Psychiatry

## 2015-11-23 NOTE — Assessment & Plan Note (Signed)
Checking x-ray left fibula/tubia to ensure healing and no deformity. If any deformity will refer to orthopedics for evaluation.

## 2015-11-23 NOTE — Progress Notes (Signed)
   Subjective:    Patient ID: Leslie Lane, female    DOB: Aug 18, 1955, 60 y.o.   MRN: [id number]  HPI The patient is a 60 YO female coming in for ankle pain. She is walking okay on the ankle (broken fibula in December) but is having sharp pains with walking and tenderness to palpation. It is not swelling. Last x-ray showed that it was healing back around January. She feels like this is limiting her mobility. No recent falls or additional injuries to the area.   Review of Systems  Constitutional: Negative.   Respiratory: Negative.   Gastrointestinal: Negative.   Musculoskeletal: Positive for arthralgias. Negative for myalgias, back pain and gait problem.  Skin: Negative.   Neurological: Negative.   Psychiatric/Behavioral: Negative.       Objective:   Physical Exam  Constitutional: She is oriented to person, place, and time. She appears well-developed and well-nourished.  HENT:  Head: Normocephalic and atraumatic.  Cardiovascular: Normal rate.   Pulmonary/Chest: Effort normal and breath sounds normal.  Abdominal: Soft. She exhibits no distension. There is no tenderness.  Musculoskeletal: She exhibits no edema.  No swelling in the left ankle or calf. Some tenderness to palpation laterally around the ankle.   Neurological: She is alert and oriented to person, place, and time. Coordination normal.  Skin: Skin is warm and dry.   Filed Vitals:   11/22/15 1434  BP: 120/62  Pulse: 82  Temp: 99.1 F (37.3 C)  TempSrc: Oral  Resp: 18  Height: 5\' 4"  (1.626 m)  Weight: 137 lb 6.4 oz (62.324 kg)  SpO2: 97%      Assessment & Plan:

## 2015-11-25 ENCOUNTER — Other Ambulatory Visit: Payer: Self-pay | Admitting: Internal Medicine

## 2015-11-30 DIAGNOSIS — M25572 Pain in left ankle and joints of left foot: Secondary | ICD-10-CM | POA: Diagnosis not present

## 2015-12-05 ENCOUNTER — Other Ambulatory Visit (HOSPITAL_COMMUNITY): Payer: Self-pay | Admitting: Psychiatry

## 2015-12-11 ENCOUNTER — Other Ambulatory Visit (HOSPITAL_COMMUNITY): Payer: Self-pay | Admitting: Psychiatry

## 2016-01-05 ENCOUNTER — Other Ambulatory Visit: Payer: Self-pay | Admitting: Endocrinology

## 2016-01-05 NOTE — Telephone Encounter (Signed)
PT called and needs a new prescription for test strips, she is using more than the prescription calls for

## 2016-01-08 ENCOUNTER — Other Ambulatory Visit (HOSPITAL_COMMUNITY): Payer: Self-pay | Admitting: Psychiatry

## 2016-01-10 ENCOUNTER — Other Ambulatory Visit: Payer: Self-pay

## 2016-01-10 MED ORDER — GLUCAGON (RDNA) 1 MG IJ KIT: PACK | INTRAMUSCULAR | Status: DC

## 2016-01-11 ENCOUNTER — Other Ambulatory Visit: Payer: Self-pay | Admitting: Endocrinology

## 2016-01-11 ENCOUNTER — Other Ambulatory Visit (INDEPENDENT_AMBULATORY_CARE_PROVIDER_SITE_OTHER): Payer: Commercial Managed Care - HMO

## 2016-01-11 DIAGNOSIS — E1065 Type 1 diabetes mellitus with hyperglycemia: Secondary | ICD-10-CM | POA: Diagnosis not present

## 2016-01-11 LAB — URINALYSIS, ROUTINE W REFLEX MICROSCOPIC
BILIRUBIN URINE: NEGATIVE
HGB URINE DIPSTICK: NEGATIVE
KETONES UR: NEGATIVE
LEUKOCYTES UA: NEGATIVE
NITRITE: NEGATIVE
RBC / HPF: NONE SEEN (ref 0–?)
Specific Gravity, Urine: 1.015 (ref 1.000–1.030)
Total Protein, Urine: NEGATIVE
UROBILINOGEN UA: 0.2 (ref 0.0–1.0)
Urine Glucose: NEGATIVE
pH: 6 (ref 5.0–8.0)

## 2016-01-11 LAB — BASIC METABOLIC PANEL
BUN: 14 mg/dL (ref 6–23)
CHLORIDE: 103 meq/L (ref 96–112)
CO2: 32 mEq/L (ref 19–32)
Calcium: 9.6 mg/dL (ref 8.4–10.5)
Creatinine, Ser: 0.79 mg/dL (ref 0.40–1.20)
GFR: 78.45 mL/min (ref >60.00)
Glucose, Bld: 141 mg/dL — ABNORMAL HIGH (ref 70–99)
POTASSIUM: 4.6 meq/L (ref 3.5–5.1)
SODIUM: 139 meq/L (ref 135–145)

## 2016-01-11 LAB — LIPID PANEL
CHOLESTEROL: 178 mg/dL (ref 0–200)
HDL: 73 mg/dL (ref >39.00)
LDL Cholesterol: 92 mg/dL (ref 0–99)
NonHDL: 104.5
TRIGLYCERIDES: 63 mg/dL (ref 0.0–149.0)
Total CHOL/HDL Ratio: 2
VLDL: 12.6 mg/dL (ref 0.0–40.0)

## 2016-01-11 LAB — MICROALBUMIN / CREATININE URINE RATIO
Creatinine,U: 162.9 mg/dL
Microalb Creat Ratio: 0.4 mg/g (ref 0.0–30.0)
Microalb, Ur: <0.7 mg/dL (ref 0.0–1.9)

## 2016-01-12 LAB — FRUCTOSAMINE: FRUCTOSAMINE: 296 umol/L — AB (ref 0–285)

## 2016-01-17 ENCOUNTER — Encounter: Payer: Self-pay | Admitting: Endocrinology

## 2016-01-17 ENCOUNTER — Other Ambulatory Visit: Payer: Self-pay | Admitting: Endocrinology

## 2016-01-17 ENCOUNTER — Ambulatory Visit (INDEPENDENT_AMBULATORY_CARE_PROVIDER_SITE_OTHER): Payer: Commercial Managed Care - HMO | Admitting: Endocrinology

## 2016-01-17 VITALS — BP 110/60 | HR 72 | Ht 64.0 in | Wt 138.0 lb

## 2016-01-17 DIAGNOSIS — E1065 Type 1 diabetes mellitus with hyperglycemia: Secondary | ICD-10-CM

## 2016-01-17 MED ORDER — INSULIN GLARGINE 300 UNIT/ML ~~LOC~~ SOPN: 14.0000 [IU] | PEN_INJECTOR | Freq: Every day | SUBCUTANEOUS | Status: DC

## 2016-01-17 MED ORDER — INSULIN PEN NEEDLE 31G X 8 MM MISC: Status: DC

## 2016-01-17 MED ORDER — GLUCOSE BLOOD VI STRP: ORAL_STRIP | Status: DC

## 2016-01-17 NOTE — Telephone Encounter (Signed)
Rx submitted

## 2016-01-17 NOTE — Progress Notes (Signed)
Patient ID: Leslie Lane, female   DOB: 1956/04/09, 60 y.o.   MRN: [id number]   Reason for Appointment: Diabetes follow-up   History of Present Illness    Diagnosis: Type 1 DIABETES MELITUS, diagnosed 1967      She has had labile blood sugar control over the last several years even though A1c has been usually around 7% She has had less lability and hypoglycemia with taking b.i.d. Lantus compared once a day She has been very sensitive to fast acting insulin and frequently does not require mealtime coverage  RECENT history:  Insulin regimen: Lantus 10 units a.m., 4 units h.s. Humalog 1-2 p.r.n. With vial  She is  taking Lantus twice a day with smaller amount in the evening Although her A1c is still fairly good at 7% her blood sugar patterns have been again labile She was told to reduce morning Lantus to 9 units but she is still doing 10  Current blood sugar patterns and problems identified:  She is now having difficulties with accurately measuring her insulin on the syringe.  Also does not appear to understand that the 30 unit syringe has 0.5 unit markings also.  She is apparently taking 2 units of Humalog when she thinks she is taking one unit as seen on her syringe today  She has occasionally taken an insulin to correct a high reading even when she is not eating a meal and on Saturday this caused her glucose to be 37  This has resulted in   She again has periodic hypoglycemia at all different times including once about 1 AM  She does not remember to have a snack when she is more active and may get low sugars  Recently has had only fasting hypoglycemia only occasionally  Also recently has not had as many high blood sugar episodes  Highest blood sugars on an average around midday  She is trying to do a little better on her diet and has not had as many readings that are high late at night  HYPOGLYCEMIA: As above, may not have symptoms with low blood sugars and  does not have early warning symptoms. Gets confused frequently and she depends on her mother to recognize low sugars and treat them. Her mother knows how to give Glucagon injection She will treat her low blood sugars usually with juice, does not like glucose tablets; sometimes will eat food at the same time      DIET: She is usually not eating balanced meals although trying to get protein with eggs in the morning Meal times: dinner 5-6 p.m.Marland Kitchen  She will eat more when she is anxious  Monitors blood glucose:  about 4-5 times a day.    Glucometer:   Accu-Chek Aviva  Blood Glucose readings from meter download as follows  Mean values apply above for all meters except median for One Touch  PRE-MEAL Fasting Lunch Dinner Bedtime Overall  Glucose range: 39-402  35-279  62-286  42-276    Mean/median: 126  165  139  120  132+/-68      Wt Readings from Last 3 Encounters:  01/17/16 138 lb (62.596 kg)  11/22/15 137 lb 6.4 oz (62.324 kg)  11/17/15 136 lb 6.4 oz (61.871 kg)    Lab Results  Component Value Date   HGBA1C 7.0* 11/12/2015   HGBA1C 6.6* 08/13/2015   HGBA1C 6.7 05/13/2015   Lab Results  Component Value Date   MICROALBUR <0.7 01/11/2016   [place] 92 01/11/2016  CREATININE 0.79 01/11/2016          Medication List       This list is accurate as of: 01/17/16 12:43 PM.  Always use your most recent med list.               ACCU-CHEK AVIVA PLUS w/Device Kit  Use to check blood sugar 4 times daily dx code E10.65     ACCU-CHEK SOFTCLIX LANCETS lancets  Use as instructed to check blood sugar 4 times per day dx code E10.65     aspirin EC 81 MG tablet  Take 81 mg by mouth every morning.     BIOFLEX PO  Take 1 tablet by mouth every morning. Reported on 11/22/2015     cholecalciferol 1000 units tablet  Commonly known as:  VITAMIN D  Take 1,000 Units by mouth daily.     clindamycin 1 % gel  Commonly known as:  CLINDAGEL  Reported on 11/22/2015     diclofenac sodium 1 %  Gel  Commonly known as:  VOLTAREN  Apply 2 g topically 4 (four) times daily.     gabapentin 100 MG capsule  Commonly known as:  NEURONTIN  Take 1-2 capsules (100-200 mg total) by mouth 2 (two) times daily.     gabapentin 300 MG capsule  Commonly known as:  NEURONTIN  TAKE 1 CAPSULE(300 MG) BY MOUTH TWICE DAILY     glucagon 1 MG injection  Commonly known as:  GLUCAGON EMERGENCY  INJECT 1 MG INTO THE SKIN AT ONCE AS NEEDED     glucose blood test strip  Commonly known as:  ACCU-CHEK AVIVA PLUS  Use as instructed to check blood sugar 4 times per day dx code E10.65     Insulin Glargine 300 UNIT/ML Sopn  Commonly known as:  TOUJEO SOLOSTAR  Inject 14 Units into the skin daily.     insulin lispro 100 UNIT/ML injection  Commonly known as:  HUMALOG  Sliding scale 10 units in am and 8 units at night     Insulin Pen Needle 31G X 8 MM Misc  Commonly known as:  AURORA PEN NEEDLES  Use to inject insulin 1 time per day     lamoTRIgine 25 MG tablet  Commonly known as:  LAMICTAL  Take 5 tablets (125 mg total) by mouth daily.     loratadine 10 MG tablet  Commonly known as:  CLARITIN  Take 10 mg by mouth as needed for allergies.     lovastatin 40 MG tablet  Commonly known as:  MEVACOR  TAKE 1 TABLET BY MOUTH EVERY NIGHT AT BEDTIME     multivitamin tablet  Take 1 tablet by mouth daily. Reported on 11/22/2015     omeprazole 40 MG capsule  Commonly known as:  PRILOSEC  Take 1 capsule (40 mg total) by mouth 2 (two) times daily.     ONE TOUCH DELICA LANCING DEV Misc  Check blood sugar 4 times daily.     traMADol 50 MG tablet  Commonly known as:  ULTRAM  TAKE 1 TABLET BY MOUTH EVERY 12 HOURS AS NEEDED FOR MODERATE OR SEVERE PAIN     trazodone 300 MG tablet  Commonly known as:  DESYREL  TAKE 1 AND 1/2 TABLETS(450 MG) BY MOUTH AT BEDTIME     vitamin A 10000 UNIT capsule  Take 10,000 Units by mouth daily.     vitamin E 1000 UNIT capsule  Generic drug:  vitamin E  Take 1,000  Units  by mouth daily.     zinc gluconate 50 MG tablet  Take 50 mg by mouth daily.        Allergies:  Allergies  Allergen Reactions  . Penicillins Anaphylaxis  . Sulfonamide Derivatives Anaphylaxis    Past Medical History  Diagnosis Date  . HEPATITIS B   . BIPOLAR AFFECTIVE DISORDER   . SMOKER   . MIGRAINE HEADACHE   . NECK PAIN, CHRONIC   . LOW BACK PAIN, CHRONIC   . ALLERGIC RHINITIS   . HYPERLIPIDEMIA   . GERD   . DIABETES MELLITUS, TYPE II     follows with endo  . DEPRESSION   . NARCOTIC ABUSE     hx of  . Anxiety   . Arthritis   . Fibromyalgia   . Diabetes mellitus type I ([place])   . Neuropathy [facility])     Past Surgical History  Procedure Laterality Date  . Cesarean section      Family History  Problem Relation Age of Onset  . Arthritis Mother   . Arthritis Father   . Kidney disease Father   . Kidney cancer Father   . Hyperlipidemia Other   . Kidney cancer Paternal Aunt   . Heart attack Brother   . Bladder Cancer Sister   . Anxiety disorder Neg Hx   . Bipolar disorder Neg Hx   . Depression Neg Hx     Social History:  reports that she has been smoking Cigarettes and E-cigarettes.  She has a 20 pack-year smoking history. She has never used smokeless tobacco. She reports that she drinks alcohol. She reports that she does not use illicit drugs.  Review of Systems:   NEUROPATHY: She is complaining about burning in her legs and feet at times She is taking low-dose gabapentin twice a day    DEPRESSION: She has had long-standing depression and anxiety  She continues to have depression, insomnia and anxiety  HYPERLIPIDEMIA: The lipid abnormality consists of elevated LDL   Is  on lovastatin 40 mg with good control   Lab Results  Component Value Date   CHOL 178 01/11/2016   HDL 73.00 01/11/2016   LDLCALC 92 01/11/2016   LDLDIRECT 101.7 03/26/2012   TRIG 63.0 01/11/2016   CHOLHDL 2 01/11/2016     Has history of small goiter but no  hypothyroidism  Lab Results  Component Value Date   TSH 2.47 11/12/2015        Examination:   BP 110/60 mmHg  Pulse 72  Ht 5' 4"  (1.626 m)  Wt 138 lb (62.596 kg)  BMI 23.68 kg/m2  SpO2 95%  Body mass index is 23.68 kg/(m^2).    ASSESSMENT/ PLAN:   Diabetes type 1 See history of present illness for detailed discussion of current insulin, blood sugar patterns, problems identified She is being treated long-term with basal insulin only and as needed Humalog  She continues to have labile blood sugars Again requiring minimal mealtime insulin She has brought her insulin syringe today and appears to be having difficulty measuring the doses accurately and she thinks she is getting 2 units of Humalog when she was planning to take 1 unit Also does not understand the markings on the syringe and she thinks her vision is not as good  Discussed also appropriate use of Humalog as she sometimes takes it randomly even when blood sugar is below 200 Recently not having significant or consistent postprandial hyperglycemia  Recommendations:  Change Lantus to Toujeo.  Discussed the  differences between this and Lantus and how to use the insulin pen  For now she can continue the same doses  She will need to remember to take extra snacks when she is more active  She will take only correction doses a 1-2 units of Humalog with meals or snacks when her blood sugars are at least 200 Follow-up in 6 weeks   Counseling time on subjects discussed above is over 50% of today's 25 minute visit  Patient Instructions  Take 10 Toueo in am and 4 at night  No Humalog unless eating a meal     Leslie Lane 01/17/2016, 12:43 PM   -

## 2016-01-17 NOTE — Telephone Encounter (Signed)
PT needs test strips sent to [facility] on [address]

## 2016-01-17 NOTE — Patient Instructions (Signed)
Take 10 Toueo in am and 4 at night  No Humalog unless eating a meal

## 2016-01-19 MED ORDER — GLUCOSE BLOOD VI STRP: ORAL_STRIP | Status: DC

## 2016-01-19 NOTE — Telephone Encounter (Signed)
Pt is calling regarding new insulin it is causing problems headache, stomach pain, BS dropped to 30 yesterday, body was shaking

## 2016-01-24 ENCOUNTER — Other Ambulatory Visit: Payer: Self-pay | Admitting: Internal Medicine

## 2016-02-06 DIAGNOSIS — R402421 Glasgow coma scale score 9-12, in the field [EMT or ambulance]: Secondary | ICD-10-CM | POA: Diagnosis not present

## 2016-02-22 ENCOUNTER — Encounter: Payer: Self-pay | Admitting: Endocrinology

## 2016-02-22 ENCOUNTER — Ambulatory Visit (INDEPENDENT_AMBULATORY_CARE_PROVIDER_SITE_OTHER): Payer: Commercial Managed Care - HMO | Admitting: Endocrinology

## 2016-02-22 VITALS — BP 100/56 | HR 68 | Ht 64.0 in | Wt 134.0 lb

## 2016-02-22 DIAGNOSIS — R110 Nausea: Secondary | ICD-10-CM | POA: Diagnosis not present

## 2016-02-22 DIAGNOSIS — E1065 Type 1 diabetes mellitus with hyperglycemia: Secondary | ICD-10-CM | POA: Diagnosis not present

## 2016-02-22 MED ORDER — INSULIN GLARGINE 100 UNIT/ML SOLOSTAR PEN: PEN_INJECTOR | SUBCUTANEOUS | 99 refills | Status: DC

## 2016-02-22 MED ORDER — METOCLOPRAMIDE HCL 5 MG PO TABS: 5.0000 mg | ORAL_TABLET | Freq: Three times a day (TID) | ORAL | 1 refills | Status: DC

## 2016-02-22 MED ORDER — GLUCOSE BLOOD VI STRP: ORAL_STRIP | 12 refills | Status: DC

## 2016-02-22 NOTE — Patient Instructions (Addendum)
Lantus 9 and 3 in pm
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
## 2016-02-28 ENCOUNTER — Other Ambulatory Visit (INDEPENDENT_AMBULATORY_CARE_PROVIDER_SITE_OTHER): Payer: Commercial Managed Care - HMO

## 2016-02-28 ENCOUNTER — Encounter: Payer: Self-pay | Admitting: Internal Medicine

## 2016-02-28 ENCOUNTER — Ambulatory Visit (INDEPENDENT_AMBULATORY_CARE_PROVIDER_SITE_OTHER): Payer: Commercial Managed Care - HMO | Admitting: Internal Medicine

## 2016-02-28 VITALS — BP 142/70 | HR 70 | Wt 133.0 lb

## 2016-02-28 DIAGNOSIS — E108 Type 1 diabetes mellitus with unspecified complications: Secondary | ICD-10-CM | POA: Diagnosis not present

## 2016-02-28 DIAGNOSIS — F319 Bipolar disorder, unspecified: Secondary | ICD-10-CM

## 2016-02-28 DIAGNOSIS — R109 Unspecified abdominal pain: Secondary | ICD-10-CM | POA: Insufficient documentation

## 2016-02-28 DIAGNOSIS — IMO0002 Reserved for concepts with insufficient information to code with codable children: Secondary | ICD-10-CM

## 2016-02-28 DIAGNOSIS — Z23 Encounter for immunization: Secondary | ICD-10-CM | POA: Diagnosis not present

## 2016-02-28 DIAGNOSIS — E1065 Type 1 diabetes mellitus with hyperglycemia: Secondary | ICD-10-CM

## 2016-02-28 HISTORY — DX: Unspecified abdominal pain: R10.9

## 2016-02-28 LAB — COMPREHENSIVE METABOLIC PANEL
ALK PHOS: 54 U/L (ref 39–117)
ALT: 12 U/L (ref 0–35)
AST: 19 U/L (ref 0–37)
Albumin: 4.2 g/dL (ref 3.5–5.2)
BUN: 17 mg/dL (ref 6–23)
CALCIUM: 9.5 mg/dL (ref 8.4–10.5)
CO2: 35 meq/L — AB (ref 19–32)
CREATININE: 0.71 mg/dL (ref 0.40–1.20)
Chloride: 101 mEq/L (ref 96–112)
GFR: 88.7 mL/min (ref >60.00)
GLUCOSE: 77 mg/dL (ref 70–99)
POTASSIUM: 4.9 meq/L (ref 3.5–5.1)
Sodium: 139 mEq/L (ref 135–145)
Total Bilirubin: 0.4 mg/dL (ref 0.2–1.2)
Total Protein: 7.2 g/dL (ref 6.0–8.3)

## 2016-02-28 LAB — CBC
HEMATOCRIT: 35.7 % — AB (ref 36.0–46.0)
Hemoglobin: 11.9 g/dL — ABNORMAL LOW (ref 12.0–15.0)
MCHC: 33.4 g/dL (ref 30.0–36.0)
MCV: 80.6 fl (ref 78.0–100.0)
Platelets: 278 10*3/uL (ref 150.0–400.0)
RBC: 4.43 Mil/uL (ref 3.87–5.11)
RDW: 17.4 % — AB (ref 11.5–15.5)
WBC: 4.8 10*3/uL (ref 4.0–10.5)

## 2016-02-28 LAB — LIPASE: LIPASE: 4 U/L — AB (ref 11.0–59.0)

## 2016-02-28 NOTE — Assessment & Plan Note (Signed)
Will forward a note to Dr. Dwyane Dee but I suspect that with her 10 pound weight loss she will need further decrease in her insulin. Her mother is still reporting sugars down to the 30s almost daily.

## 2016-02-28 NOTE — Assessment & Plan Note (Signed)
Referral to GI for the lower abdomen pain. She does have CT scan back in 2015 without acute findings. She is taking reglan now with relief some of the time. If she is to get a gastric emptying study several of her medications would need to be held beforehand.

## 2016-02-28 NOTE — Assessment & Plan Note (Signed)
Feel that this is significantly worse with recent events and concern that this could impact her safety. Encouraged her to reach out to her mental health provider for adjustment of regimen.

## 2016-02-28 NOTE — Progress Notes (Signed)
Pre visit review using our clinic review tool, if applicable. No additional management support is needed unless otherwise documented below in the visit note. 

## 2016-02-28 NOTE — Patient Instructions (Signed)
We have given you the flu shot today.   We are checking the blood work today and will call you with the results.   We will get you in with GI to help find the cause of the stomach pain.   I will message Dr. Dwyane Dee to see if the insulin doses need to be adjusted further.
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
## 2016-03-02 ENCOUNTER — Ambulatory Visit (INDEPENDENT_AMBULATORY_CARE_PROVIDER_SITE_OTHER): Payer: Medicare HMO | Admitting: Psychiatry

## 2016-03-02 ENCOUNTER — Encounter (HOSPITAL_COMMUNITY): Payer: Self-pay | Admitting: Psychiatry

## 2016-03-02 VITALS — BP 140/80 | HR 82 | Ht 64.5 in | Wt 132.8 lb

## 2016-03-02 DIAGNOSIS — G4700 Insomnia, unspecified: Secondary | ICD-10-CM | POA: Diagnosis not present

## 2016-03-02 DIAGNOSIS — F332 Major depressive disorder, recurrent severe without psychotic features: Secondary | ICD-10-CM | POA: Diagnosis not present

## 2016-03-02 DIAGNOSIS — F411 Generalized anxiety disorder: Secondary | ICD-10-CM

## 2016-03-02 DIAGNOSIS — F4010 Social phobia, unspecified: Secondary | ICD-10-CM

## 2016-03-02 MED ORDER — TRAZODONE HCL 300 MG PO TABS: ORAL_TABLET | ORAL | 1 refills | Status: DC

## 2016-03-02 MED ORDER — LAMOTRIGINE 150 MG PO TABS: 150.0000 mg | ORAL_TABLET | Freq: Every day | ORAL | 1 refills | Status: DC
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
## 2016-03-09 ENCOUNTER — Other Ambulatory Visit: Payer: Self-pay | Admitting: Internal Medicine

## 2016-03-19 ENCOUNTER — Other Ambulatory Visit: Payer: Self-pay | Admitting: Internal Medicine

## 2016-03-22 ENCOUNTER — Telehealth: Payer: Self-pay | Admitting: Endocrinology

## 2016-03-22 NOTE — Telephone Encounter (Signed)
She was supposed to take only 9 units of Lantus in the morning If she is having low sugars more than twice a week will need to change her Lantus to Levemir, same doses of Lantus, let her know that this will cause less variability

## 2016-03-22 NOTE — Telephone Encounter (Signed)
Please follow-up with patient to see if she is having low blood sugars again as reported by her PCP.  May need to change her Lantus to Levemir

## 2016-03-22 NOTE — Telephone Encounter (Signed)
Pt. Reported that FBS today at 10AM was 199.  She took 10u of lantus.  She then ate a 1/2 of a Navel orange.  At 1:30PM her blood sugar was 35.  She then at another 1/2 of an orange and is now eating lunch (cheese sandwich with a salad).

## 2016-03-28 NOTE — Telephone Encounter (Signed)
I spoke with Leslie Lane, and told her to reduce her Am dose of Lantus to 9 units (she is still taking 10u). And if she has more than 2 low blood sugars per week, she is to call up her so that we change her dose to Levemir.  She reported good understanding of this.

## 2016-04-05 ENCOUNTER — Encounter: Payer: Self-pay | Admitting: Endocrinology

## 2016-04-05 ENCOUNTER — Ambulatory Visit (INDEPENDENT_AMBULATORY_CARE_PROVIDER_SITE_OTHER): Payer: Commercial Managed Care - HMO | Admitting: Endocrinology

## 2016-04-05 VITALS — BP 114/54 | HR 79 | Wt 127.0 lb

## 2016-04-05 DIAGNOSIS — E1065 Type 1 diabetes mellitus with hyperglycemia: Secondary | ICD-10-CM | POA: Diagnosis not present

## 2016-04-05 NOTE — Patient Instructions (Addendum)
Take Humalog 1 unit only for large portions of Carbs or hi fat meals  After walking or housecleaning have a snack with some carbs and protein  No Humalog with hi sugars unless sugar stays >250 2 x in a row  Take Metclopramide before each meal
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
## 2016-04-13 ENCOUNTER — Other Ambulatory Visit: Payer: Self-pay | Admitting: Internal Medicine

## 2016-04-14 NOTE — Telephone Encounter (Signed)
Faxed to pharmacy

## 2016-04-18 ENCOUNTER — Encounter: Payer: Self-pay | Admitting: Gastroenterology

## 2016-04-18 ENCOUNTER — Ambulatory Visit (INDEPENDENT_AMBULATORY_CARE_PROVIDER_SITE_OTHER): Payer: Commercial Managed Care - HMO | Admitting: Gastroenterology

## 2016-04-18 VITALS — BP 96/60 | HR 80 | Ht 64.25 in | Wt 124.8 lb

## 2016-04-18 DIAGNOSIS — R1030 Lower abdominal pain, unspecified: Secondary | ICD-10-CM | POA: Diagnosis not present

## 2016-04-18 DIAGNOSIS — R102 Pelvic and perineal pain: Secondary | ICD-10-CM

## 2016-04-18 NOTE — Progress Notes (Signed)
    History of Present Illness: This is a 60 year old female complaining of lower abdominal pain for several years. She was previously evaluated in January 2015 for lower abdominal pain. Symptoms were felt to be related to irritable bowel syndrome and/or abdominal wall pain. She underwent colonoscopy by Dr. Penelope Coop in November 2010 which was normal. Abdominal/pelvic CT scan with contrast performed in January 2015 showed mild diffuse wall thickening of the urinary bladder, otherwise unremarkable study. GU consultation was obtained who diagnosed female pelvic pain and cystitis. Thickened urinary bladder was felt secondary to under distention. Glycopyrrolate 1 mg twice a day was prescribed in 2015 but did not help. She relates ongoing problems with lower abdominal/pelvic pain for several years that has not changed. Tramadol helps the pain but causes constipation so she does not like to take it. Her pain is unrelated to meals or bowel movements. She has significant problems with fibromyalgia. Recent CBC, CMP, lipase unremarkable except for a mild anemia with Hb=11.9. Denies weight loss, diarrhea, change in stool caliber, melena, hematochezia, nausea, vomiting, dysphagia, reflux symptoms, chest pain.  Current Medications, Allergies, Past Medical History, Past Surgical History, Family History and Social History were reviewed in [employer] record.  Physical Exam: General: Well developed, well nourished, no acute distress Head: Normocephalic and atraumatic Eyes:  sclerae anicteric, EOMI Ears: Normal auditory acuity Mouth: No deformity or lesions Lungs: Clear throughout to auscultation Heart: Regular rate and rhythm; no murmurs, rubs or bruits Abdomen: Soft, minimal suprapubic tenderness and non distended. No masses, hepatosplenomegaly or hernias noted. Normal Bowel sounds Musculoskeletal: Symmetrical with no gross deformities  Pulses:  Normal pulses noted Extremities: No clubbing,  cyanosis, edema or deformities noted Neurological: Alert oriented x 4, grossly nonfocal Psychological:  Alert and cooperative. Normal mood and affect  Assessment and Recommendations:  1. Lower abdominal wall pain/pelvic pain. Her pain does not have features typical for a GI cause of pain and her prior GI evaluation was unremarkable. Advised GYN evaluation and considering options for fibromyalgia mgmt or abdominal wall pain mgmt if no GYN problem is uncovered. Consider discontnuing metoclopramide-per prescribing MD. No further GI evaluation is planned at this time. Follow up with GYN and PCP.   2. Mild normocytic anemia. Further evaluation per PCP.

## 2016-04-18 NOTE — Patient Instructions (Addendum)
You have been given a separate informational sheet regarding your tobacco use, the importance of quitting and local resources to help you quit.  Please follow up with your primary care physician.   Thank you for choosing me and [facility].  Pricilla Riffle. Dagoberto Ligas., MD., Marval Regal

## 2016-04-20 ENCOUNTER — Other Ambulatory Visit: Payer: Self-pay | Admitting: Internal Medicine

## 2016-05-17 DIAGNOSIS — Z0100 Encounter for examination of eyes and vision without abnormal findings: Secondary | ICD-10-CM | POA: Diagnosis not present

## 2016-05-17 DIAGNOSIS — H524 Presbyopia: Secondary | ICD-10-CM | POA: Diagnosis not present

## 2016-05-17 DIAGNOSIS — E109 Type 1 diabetes mellitus without complications: Secondary | ICD-10-CM | POA: Diagnosis not present

## 2016-05-17 LAB — HM DIABETES EYE EXAM

## 2016-05-24 ENCOUNTER — Other Ambulatory Visit: Payer: Self-pay | Admitting: Internal Medicine

## 2016-05-31 ENCOUNTER — Other Ambulatory Visit (INDEPENDENT_AMBULATORY_CARE_PROVIDER_SITE_OTHER): Payer: Commercial Managed Care - HMO

## 2016-05-31 ENCOUNTER — Encounter: Payer: Self-pay | Admitting: Internal Medicine

## 2016-05-31 ENCOUNTER — Ambulatory Visit (INDEPENDENT_AMBULATORY_CARE_PROVIDER_SITE_OTHER): Payer: Commercial Managed Care - HMO | Admitting: Internal Medicine

## 2016-05-31 VITALS — BP 128/68 | HR 90 | Temp 98.1°F | Resp 14 | Ht 63.0 in | Wt 126.0 lb

## 2016-05-31 DIAGNOSIS — Z2911 Encounter for prophylactic immunotherapy for respiratory syncytial virus (RSV): Secondary | ICD-10-CM

## 2016-05-31 DIAGNOSIS — R109 Unspecified abdominal pain: Secondary | ICD-10-CM

## 2016-05-31 DIAGNOSIS — Z23 Encounter for immunization: Secondary | ICD-10-CM

## 2016-05-31 DIAGNOSIS — R5383 Other fatigue: Secondary | ICD-10-CM | POA: Diagnosis not present

## 2016-05-31 DIAGNOSIS — F17210 Nicotine dependence, cigarettes, uncomplicated: Secondary | ICD-10-CM

## 2016-05-31 DIAGNOSIS — K219 Gastro-esophageal reflux disease without esophagitis: Secondary | ICD-10-CM

## 2016-05-31 DIAGNOSIS — Z299 Encounter for prophylactic measures, unspecified: Secondary | ICD-10-CM

## 2016-05-31 LAB — VITAMIN B12: Vitamin B-12: 405 pg/mL (ref 211–911)

## 2016-05-31 LAB — TSH: TSH: 0.69 u[IU]/mL (ref 0.35–4.50)

## 2016-05-31 LAB — FERRITIN: Ferritin: 7.2 ng/mL — ABNORMAL LOW (ref 10.0–291.0)

## 2016-05-31 LAB — VITAMIN D 25 HYDROXY (VIT D DEFICIENCY, FRACTURES): VITD: 50.94 ng/mL (ref 30.00–100.00)

## 2016-05-31 NOTE — Assessment & Plan Note (Signed)
She is not willing to make an attempt to quit at this time but will continue to think about it.

## 2016-05-31 NOTE — Assessment & Plan Note (Signed)
Doubling omeprazole to BID has not helped so she is advised to go back to daily only.

## 2016-05-31 NOTE — Progress Notes (Signed)
   Subjective:    Patient ID: MARKEITHA WAECHTER, female    DOB: [birth year], 60 y.o.   MRN: [id number]  HPI The patient is a 60 YO female coming in for follow up of her stomach pain. She is doing well with the gabapentin. She has done the dose change from last time and this is helping out more. She does still have limited appetite but does eat due to taking the insulin. No side effects from the gabapentin.   Review of Systems  Constitutional: Negative.   Respiratory: Negative.   Cardiovascular: Negative.   Gastrointestinal: Positive for abdominal pain. Negative for abdominal distention, blood in stool, constipation, diarrhea and nausea.  Musculoskeletal: Positive for arthralgias and back pain.  Skin: Negative.   Neurological: Negative.       Objective:   Physical Exam  Constitutional: She is oriented to person, place, and time. She appears well-developed and well-nourished.  HENT:  Head: Normocephalic and atraumatic.  Eyes: EOM are normal.  Neck: Normal range of motion.  Cardiovascular: Normal rate and regular rhythm.   Pulmonary/Chest: Effort normal. No respiratory distress. She has no wheezes. She has no rales.  Abdominal: Soft. Bowel sounds are normal. She exhibits no distension. There is no tenderness. There is no rebound.  Musculoskeletal: She exhibits no edema.  Neurological: She is alert and oriented to person, place, and time.  Skin: Skin is warm and dry.   Vitals:   05/31/16 1057  BP: 128/68  Pulse: 90  Resp: 14  Temp: 98.1 F (36.7 C)  TempSrc: Oral  SpO2: 93%  Weight: 126 lb (57.2 kg)  Height: 5\' 3"  (1.6 m)      Assessment & Plan:  Shingles shot given at visit.

## 2016-05-31 NOTE — Assessment & Plan Note (Signed)
Using gabapentin for the pain with good success. Okay with continuing.

## 2016-05-31 NOTE — Patient Instructions (Signed)
We will check the labs today and call you back with the results to see if we can find something to explain the tiredness.

## 2016-05-31 NOTE — Progress Notes (Signed)
Pre visit review using our clinic review tool, if applicable. No additional management support is needed unless otherwise documented below in the visit note. 

## 2016-06-05 ENCOUNTER — Other Ambulatory Visit (INDEPENDENT_AMBULATORY_CARE_PROVIDER_SITE_OTHER): Payer: Commercial Managed Care - HMO

## 2016-06-05 DIAGNOSIS — E1065 Type 1 diabetes mellitus with hyperglycemia: Secondary | ICD-10-CM | POA: Diagnosis not present

## 2016-06-05 LAB — HEMOGLOBIN A1C: HEMOGLOBIN A1C: 7 % — AB (ref 4.6–6.5)

## 2016-06-05 LAB — GLUCOSE, RANDOM: GLUCOSE: 67 mg/dL — AB (ref 70–99)

## 2016-06-08 ENCOUNTER — Ambulatory Visit (INDEPENDENT_AMBULATORY_CARE_PROVIDER_SITE_OTHER): Payer: Commercial Managed Care - HMO | Admitting: Endocrinology

## 2016-06-08 VITALS — BP 120/72 | HR 67 | Wt 122.0 lb

## 2016-06-08 DIAGNOSIS — E1043 Type 1 diabetes mellitus with diabetic autonomic (poly)neuropathy: Secondary | ICD-10-CM

## 2016-06-08 DIAGNOSIS — K3184 Gastroparesis: Secondary | ICD-10-CM | POA: Diagnosis not present

## 2016-06-08 DIAGNOSIS — E1065 Type 1 diabetes mellitus with hyperglycemia: Secondary | ICD-10-CM

## 2016-06-08 NOTE — Progress Notes (Signed)
Patient ID: Leslie Lane, female   DOB: 10-29-1955, 60 y.o.   MRN: [id number]   Reason for Appointment: Diabetes follow-up   History of Present Illness    Diagnosis: Type 1 DIABETES MELITUS, diagnosed 1967      She has had labile blood sugar control over the last several years even though A1c has been usually around 7% She has had less lability and hypoglycemia with taking b.i.d. Lantus compared once a day She has been very sensitive to fast acting insulin and frequently does not require mealtime coverage She cannot tolerate [place] because of reported episode of headache, bloating and nausea with the first dose  RECENT history:  Insulin regimen: Lantus 9 units a.m., 4 units h.s. Humalog 1-2 p.r.n.   She is  taking Lantus twice a day with smaller amount in the evening  Last A1c was 7%, unchanged  Current blood sugar patterns and problems identified:  She is recently having more frequent hypoglycemia with low blood sugars almost daily.  She thinks that this is related to her being more active and walking more during the day with shopping and other activities.  She did not notify her son did not change her insulin.  She does not always take extra snacks when she is planning to be more active.  Now she said that she has not taken any Humalog insulin unless blood sugars are over 300 and she rarely has readings over 250 in the last few days  However she has had low readings as low as 28 at midnight  Diet is quite variable, sometimes be more in the evenings when sugars may be the highest overall  fasting readings are overall quite variable in the last month would have been normal or low in the last week or so, lowest fasting reading 30  She is using the Lantus pen in the evening and a syringe in the morning   she tends to overeat when she has low blood sugars as she gets excessively hungry.  HYPOGLYCEMIA: As above, may not have symptoms with low blood sugars and  does not have early warning symptoms. Gets confused frequently and she depends on her mother to recognize low sugars and treat them. Her mother knows how to give Glucagon injection She will treat her low blood sugars usually with juice, does not like glucose tablets; sometimes will eat food at the same time      DIET: She is trying to get protein with eggs in the morning Meal times: dinner 5-6 p.m.Marland Kitchen  She will eat more when she is anxious  Monitors blood glucose:  about 4-5 times a day.    Glucometer:   Accu-Chek Aviva  Blood Glucose readings from meter download as follows  Mean values apply above for all meters except median for One Touch  PRE-MEAL Fasting Lunch Dinner Bedtime Overall  Glucose range: 30-253  36-187  28-188  28-346  23-346   Mean/median: 113  70   155  128+/-66     Wt Readings from Last 3 Encounters:  06/08/16 122 lb (55.3 kg)  05/31/16 126 lb (57.2 kg)  04/18/16 124 lb 12.8 oz (56.6 kg)    Lab Results  Component Value Date   HGBA1C 7.0 (H) 06/05/2016   HGBA1C 7.0 (H) 11/12/2015   HGBA1C 6.6 (H) 08/13/2015   Lab Results  Component Value Date   MICROALBUR <0.7 01/11/2016   LDLCALC 92 01/11/2016   CREATININE 0.71 02/28/2016  Medication List       Accurate as of 06/08/16 11:59 PM. Always use your most recent med list.          ACCU-CHEK AVIVA PLUS w/Device Kit Use to check blood sugar 4 times daily dx code E10.65   ACCU-CHEK SOFTCLIX LANCETS lancets Use as instructed to check blood sugar 4 times per day dx code E10.65   aspirin EC 81 MG tablet Take 81 mg by mouth every morning.   BIOFLEX PO Take 1 tablet by mouth every morning. Reported on 11/22/2015   cholecalciferol 1000 units tablet Commonly known as:  VITAMIN D Take 1,000 Units by mouth daily.   clindamycin 1 % gel Commonly known as:  CLINDAGEL Reported on 11/22/2015   diclofenac sodium 1 % Gel Commonly known as:  VOLTAREN APPLY 2 GRAMS EXTERNALLY TO THE AFFECTED AREA FOUR  TIMES DAILY   gabapentin 100 MG capsule Commonly known as:  NEURONTIN TAKE 1 TO 2 CAPSULES(100 TO 200 MG) BY MOUTH TWICE DAILY   glucagon 1 MG injection Commonly known as:  GLUCAGON EMERGENCY INJECT 1 MG INTO THE SKIN AT ONCE AS NEEDED   glucose blood test strip Commonly known as:  ACCU-CHEK AVIVA PLUS Use as instructed to check blood sugar 4 times per day dx code E10.65   glucose blood test strip Commonly known as:  ACCU-CHEK ACTIVE STRIPS Use as instructed   Insulin Glargine 100 UNIT/ML Solostar Pen Commonly known as:  LANTUS SOLOSTAR 9 units am and 3 in pm   insulin lispro 100 UNIT/ML injection Commonly known as:  HUMALOG Sliding scale 10 units in am and 8 units at night   Insulin Pen Needle 31G X 8 MM Misc Commonly known as:  AUR[place]A PEN NEEDLES Use to inject insulin 1 time per day   Iron 325 (65 Fe) MG Tabs Take 1 tablet by mouth as needed.   lamoTRIgine 150 MG tablet Commonly known as:  LAMICTAL Take 1 tablet (150 mg total) by mouth daily.   loratadine 10 MG tablet Commonly known as:  CLARITIN Take 10 mg by mouth as needed for allergies.   lovastatin 40 MG tablet Commonly known as:  MEVACOR TAKE 1 TABLET BY MOUTH EVERY NIGHT AT BEDTIME   Magnesium 500 MG Tabs Take 1 tablet by mouth as needed.   metoCLOPramide 5 MG tablet Commonly known as:  REGLAN Take 1 tablet (5 mg total) by mouth 3 (three) times daily before meals.   multivitamin tablet Take 1 tablet by mouth daily. Reported on 11/22/2015   omeprazole 40 MG capsule Commonly known as:  PRILOSEC TAKE 1 CAPSULE(40 MG) BY MOUTH TWICE DAILY   ONE TOUCH DELICA LANCING DEV Misc Check blood sugar 4 times daily.   traMADol 50 MG tablet Commonly known as:  ULTRAM TAKE 1 TABLET BY MOUTH EVERY 12 HOURS AS NEEDED FOR MODERATE TO SEVERE PAIN   trazodone 300 MG tablet Commonly known as:  DESYREL TAKE 1 AND 1/2 TABLETS(450 MG) BY MOUTH AT BEDTIME   vitamin A 10000 UNIT capsule Take 10,000 Units by mouth  daily.   vitamin E 1000 UNIT capsule Generic drug:  vitamin E Take 1,000 Units by mouth daily.   zinc gluconate 50 MG tablet Take 50 mg by mouth daily.       Allergies:  Allergies  Allergen Reactions  . Penicillins Anaphylaxis  . Sulfonamide Derivatives Anaphylaxis    Past Medical History:  Diagnosis Date  . ALLERGIC RHINITIS   . Anxiety   . Arthritis   .  BIPOLAR AFFECTIVE DISORDER   . DEPRESSION   . Diabetes mellitus type I ([place])   . DIABETES MELLITUS, TYPE II    follows with endo  . Fibromyalgia   . GERD   . HEPATITIS B   . HYPERLIPIDEMIA   . LOW BACK PAIN, CHRONIC   . MIGRAINE HEADACHE   . NARCOTIC ABUSE    hx of  . NECK PAIN, CHRONIC   . Neuropathy ([place])   . SMOKER     Past Surgical History:  Procedure Laterality Date  . CESAREAN SECTION    . TUBAL LIGATION      Family History  Problem Relation Age of Onset  . Arthritis Mother   . Colon cancer Mother     ? age of dx  . Arthritis Father   . Kidney disease Father   . Kidney cancer Father   . Heart attack Brother   . Bladder Cancer Sister   . Hyperlipidemia Other   . Kidney cancer Paternal Aunt   . Anxiety disorder Neg Hx   . Bipolar disorder Neg Hx   . Depression Neg Hx     Social History:  reports that she has been smoking Cigarettes.  She has a 20.00 pack-year smoking history. She has never used smokeless tobacco. She reports that she drinks alcohol. She reports that she does not use drugs.  Review of Systems:  She is having nausea at times and pain In abdomen, Reglan does help some nausea but she still has tendency to pain, hasn't discussed with PCP   NEUROPATHY: She has burning in her legs and feet at times She is taking gabapentin and Voltaren gel with some relief   DEPRESSION: She has had long-standing depression and anxiety with variable level of control   HYPERLIPIDEMIA: The lipid abnormality consists of elevated LDL   Is  on lovastatin 40 mg with good control   Lab Results    Component Value Date   CHOL 178 01/11/2016   HDL 73.00 01/11/2016   LDLCALC 92 01/11/2016   LDLDIRECT 101.7 03/26/2012   TRIG 63.0 01/11/2016   CHOLHDL 2 01/11/2016     Has history of small goiter but no hypothyroidism  Lab Results  Component Value Date   TSH 0.69 05/31/2016       Examination:   BP 120/72   Pulse 67   Wt 122 lb (55.3 kg)   SpO2 97%   BMI 21.61 kg/m   Body mass index is 21.61 kg/m.    ASSESSMENT/ PLAN:   Diabetes type 1 See history of present illness for detailed discussion of current insulin, blood sugar patterns, problems identified She is being treated long-term with basal insulin only and as needed Humalog for significantly high readings and no mealtime coverage  Her blood sugars are much more difficult to control with frequent hypoglycemia now even with low doses of insulin She is generally more active and probably does not have enough snacks to contract activity especially before lunch, currently having low blood sugars almost daily She has severe hypoglycemia unawareness and blood sugars can be as low as 23 with minimal or no symptoms  She does not appear to have any diabetes complications, last microalbumin normal as also eye exam  Recommendations:  Reduce morning Lantus by 2 units down to 7 units  She will consider taking [place] once her Lantus runs out, most likely if she continues the same total dose of 11 units she can start with 10 units of Tresiba.  Discussed that this can be used once a day  Also she can switch to the insulin pen instead of using a syringe for her morning shot   She will need to have more snacks in between meals when she is planning to be more active and use these even when she is not at home.  Consider Freestyle Libre sensor for home use when this is approved  Again discussed avoiding Humalog unless blood sugars are over 300  Balanced meals with some protein consistently  Probable mild gastroparesis: She can  continue to take Reglan before meals  Recommend GI consultation for recurrent abdominal pain  Hyperlipidemia: Well controlled as of 7/17  Memory issues: This may well be partly related to her recurrent hypoglycemia     Patient Instructions  Lantus 7 in am and 4 in pm   Call when about to finish Lantus  Counseling time on subjects discussed above is over 50% of today's 25 minute visit   Kurtis Anastasia 06/09/2016, 9:16 AM

## 2016-06-08 NOTE — Patient Instructions (Addendum)
Lantus 7 in am and 4 in pm   Call when about to finish Lantus

## 2016-06-12 ENCOUNTER — Encounter: Payer: Self-pay | Admitting: Endocrinology

## 2016-06-22 ENCOUNTER — Other Ambulatory Visit: Payer: Self-pay | Admitting: Internal Medicine

## 2016-06-30 ENCOUNTER — Telehealth: Payer: Self-pay | Admitting: Internal Medicine

## 2016-06-30 NOTE — Telephone Encounter (Signed)
Pt called request help from our office. Pt had ov with Dr. Sharlet Salina received shingle shot and now she got the bill of $355. She stated she is on disability and can not afford this, she was wondering if there is any way we can help her. Please call her back

## 2016-07-03 DIAGNOSIS — D126 Benign neoplasm of colon, unspecified: Secondary | ICD-10-CM

## 2016-07-03 HISTORY — DX: Benign neoplasm of colon, unspecified: D12.6

## 2016-07-06 ENCOUNTER — Ambulatory Visit (HOSPITAL_COMMUNITY): Payer: Self-pay | Admitting: Psychiatry

## 2016-07-06 ENCOUNTER — Encounter (HOSPITAL_COMMUNITY): Payer: Self-pay | Admitting: Psychiatry

## 2016-07-06 ENCOUNTER — Ambulatory Visit (INDEPENDENT_AMBULATORY_CARE_PROVIDER_SITE_OTHER): Payer: Medicare HMO | Admitting: Psychiatry

## 2016-07-06 DIAGNOSIS — F332 Major depressive disorder, recurrent severe without psychotic features: Secondary | ICD-10-CM | POA: Diagnosis not present

## 2016-07-06 DIAGNOSIS — G4701 Insomnia due to medical condition: Secondary | ICD-10-CM

## 2016-07-06 DIAGNOSIS — F4010 Social phobia, unspecified: Secondary | ICD-10-CM

## 2016-07-06 DIAGNOSIS — Z800 Family history of malignant neoplasm of digestive organs: Secondary | ICD-10-CM

## 2016-07-06 DIAGNOSIS — Z79899 Other long term (current) drug therapy: Secondary | ICD-10-CM

## 2016-07-06 DIAGNOSIS — Z8261 Family history of arthritis: Secondary | ICD-10-CM | POA: Diagnosis not present

## 2016-07-06 DIAGNOSIS — Z841 Family history of disorders of kidney and ureter: Secondary | ICD-10-CM

## 2016-07-06 DIAGNOSIS — Z7982 Long term (current) use of aspirin: Secondary | ICD-10-CM

## 2016-07-06 DIAGNOSIS — Z8249 Family history of ischemic heart disease and other diseases of the circulatory system: Secondary | ICD-10-CM

## 2016-07-06 DIAGNOSIS — Z794 Long term (current) use of insulin: Secondary | ICD-10-CM

## 2016-07-06 DIAGNOSIS — Z8051 Family history of malignant neoplasm of kidney: Secondary | ICD-10-CM

## 2016-07-06 DIAGNOSIS — Z8052 Family history of malignant neoplasm of bladder: Secondary | ICD-10-CM

## 2016-07-06 MED ORDER — LAMOTRIGINE 150 MG PO TABS: 150.0000 mg | ORAL_TABLET | Freq: Every day | ORAL | 1 refills | Status: DC

## 2016-07-06 MED ORDER — TRAZODONE HCL 300 MG PO TABS: ORAL_TABLET | ORAL | 1 refills | Status: DC
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
## 2016-07-19 NOTE — Telephone Encounter (Signed)
Email sent to PB/Billing to handle.l

## 2016-07-20 ENCOUNTER — Ambulatory Visit: Payer: Self-pay | Admitting: Endocrinology

## 2016-07-24 ENCOUNTER — Other Ambulatory Visit: Payer: Self-pay

## 2016-07-24 ENCOUNTER — Ambulatory Visit (INDEPENDENT_AMBULATORY_CARE_PROVIDER_SITE_OTHER): Payer: Commercial Managed Care - HMO | Admitting: Endocrinology

## 2016-07-24 ENCOUNTER — Encounter: Payer: Self-pay | Admitting: Endocrinology

## 2016-07-24 VITALS — BP 120/62 | HR 71 | Ht 64.0 in | Wt 120.0 lb

## 2016-07-24 DIAGNOSIS — E7800 Pure hypercholesterolemia, unspecified: Secondary | ICD-10-CM | POA: Diagnosis not present

## 2016-07-24 DIAGNOSIS — E049 Nontoxic goiter, unspecified: Secondary | ICD-10-CM

## 2016-07-24 DIAGNOSIS — E1065 Type 1 diabetes mellitus with hyperglycemia: Secondary | ICD-10-CM | POA: Diagnosis not present

## 2016-07-24 DIAGNOSIS — Z23 Encounter for immunization: Secondary | ICD-10-CM | POA: Diagnosis not present

## 2016-07-24 MED ORDER — INSULIN GLARGINE 100 UNIT/ML SOLOSTAR PEN: PEN_INJECTOR | SUBCUTANEOUS | 99 refills | Status: DC
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
## 2016-07-24 NOTE — Patient Instructions (Signed)
Lantus 7 in am; if getting low in am cut down to 3 at nite  For more activity get more Carbs like fruit before being active

## 2016-07-25 ENCOUNTER — Other Ambulatory Visit: Payer: Self-pay | Admitting: Endocrinology

## 2016-07-25 ENCOUNTER — Other Ambulatory Visit: Payer: Self-pay | Admitting: Internal Medicine

## 2016-07-25 ENCOUNTER — Other Ambulatory Visit (HOSPITAL_COMMUNITY): Payer: Self-pay | Admitting: Psychiatry

## 2016-07-25 DIAGNOSIS — F4010 Social phobia, unspecified: Secondary | ICD-10-CM

## 2016-07-25 DIAGNOSIS — G4700 Insomnia, unspecified: Secondary | ICD-10-CM

## 2016-07-27 ENCOUNTER — Telehealth: Payer: Self-pay | Admitting: Endocrinology

## 2016-07-27 ENCOUNTER — Other Ambulatory Visit: Payer: Self-pay

## 2016-07-27 MED ORDER — GLUCOSE BLOOD VI STRP: ORAL_STRIP | 12 refills | Status: DC

## 2016-07-27 NOTE — Telephone Encounter (Signed)
Pt needs her Accu Check Aviva test strips, testing 6x daily sent to [facility], she is almost out.

## 2016-07-27 NOTE — Telephone Encounter (Signed)
Ordered

## 2016-08-28 ENCOUNTER — Other Ambulatory Visit (HOSPITAL_COMMUNITY): Payer: Self-pay | Admitting: Psychiatry

## 2016-08-28 DIAGNOSIS — G4700 Insomnia, unspecified: Secondary | ICD-10-CM

## 2016-09-03 ENCOUNTER — Other Ambulatory Visit (HOSPITAL_COMMUNITY): Payer: Self-pay | Admitting: Psychiatry

## 2016-09-03 DIAGNOSIS — F4010 Social phobia, unspecified: Secondary | ICD-10-CM

## 2016-09-03 DIAGNOSIS — F332 Major depressive disorder, recurrent severe without psychotic features: Secondary | ICD-10-CM

## 2016-09-11 ENCOUNTER — Other Ambulatory Visit: Payer: Self-pay | Admitting: Endocrinology

## 2016-09-14 ENCOUNTER — Other Ambulatory Visit: Payer: Self-pay

## 2016-09-14 ENCOUNTER — Other Ambulatory Visit: Payer: Self-pay | Admitting: Endocrinology

## 2016-09-14 ENCOUNTER — Telehealth: Payer: Self-pay | Admitting: Internal Medicine

## 2016-09-14 MED ORDER — INSULIN PEN NEEDLE 31G X 8 MM MISC: 0 refills | Status: DC

## 2016-09-14 NOTE — Telephone Encounter (Signed)
Ordered

## 2016-09-14 NOTE — Telephone Encounter (Signed)
Patient needs her pen needle medication refilled. She only has two pen needles left.  Please send it to [facility] on [address].

## 2016-09-22 ENCOUNTER — Telehealth: Payer: Self-pay | Admitting: Internal Medicine

## 2016-09-22 ENCOUNTER — Other Ambulatory Visit: Payer: Self-pay | Admitting: Internal Medicine

## 2016-09-22 DIAGNOSIS — Z1231 Encounter for screening mammogram for malignant neoplasm of breast: Secondary | ICD-10-CM

## 2016-09-22 MED ORDER — GABAPENTIN 100 MG PO CAPS: ORAL_CAPSULE | ORAL | 0 refills | Status: DC

## 2016-09-22 NOTE — Telephone Encounter (Signed)
done

## 2016-09-22 NOTE — Telephone Encounter (Signed)
Pt needs a refill on Gabapentin (NEURONTIN) 100 MG capsule sent to [facility] on [address]. Please advise. Thanks  [employer]

## 2016-09-26 ENCOUNTER — Telehealth: Payer: Self-pay

## 2016-09-26 MED ORDER — LOVASTATIN 40 MG PO TABS: 40.0000 mg | ORAL_TABLET | Freq: Every day | ORAL | 1 refills | Status: DC

## 2016-09-26 MED ORDER — OMEPRAZOLE 40 MG PO CPDR: 40.0000 mg | DELAYED_RELEASE_CAPSULE | Freq: Every day | ORAL | 1 refills | Status: DC

## 2016-09-26 NOTE — Telephone Encounter (Signed)
Med refills

## 2016-09-26 NOTE — Progress Notes (Deleted)
Subjective:   Leslie Lane is a 61 y.o. female who presents for an Initial Medicare Annual Wellness Visit.  Review of Systems    Physical assessment deferred to PCP.   Sleep patterns: {SX; SLEEP PATTERNS:18802::"feels rested on waking","does not get up to void","gets up *** times nightly to void","sleeps *** hours nightly"}.   Home Safety/Smoke Alarms:   Living environment; residence and Firearm Safety: {Rehab home environment / accessibility:30080::"no firearms","firearms stored safely"}. Seat Belt Safety/Bike Helmet: Wears seat belt.   Counseling:   Eye Exam-  Dental-  Female:   Pap- Last 08/18/14,        Mammo- Last 05/13/14       Dexa scan-       CCS- Last 05/26/09,       Objective:    There were no vitals filed for this visit. There is no height or weight on file to calculate BMI.   Current Medications (verified) Outpatient Encounter Prescriptions as of 09/27/2016  Medication Sig  . ACCU-CHEK AVIVA PLUS test strip USE AS INSTRUCTED TO CHECK BLOOD SUGAR FOUR TIMES DAILY.  Marland Kitchen ACCU-CHEK SOFTCLIX LANCETS lancets Use as instructed to check blood sugar 4 times per day dx code E10.65  . aspirin EC 81 MG tablet Take 81 mg by mouth every morning.  . B-D ULTRAFINE III SHORT PEN 31G X 8 MM MISC INJECT INSULIN ONCE DAILY  . Bioflavonoid Products (BIOFLEX PO) Take 1 tablet by mouth every morning. Reported on 11/22/2015  . Blood Glucose Monitoring Suppl (ACCU-CHEK AVIVA PLUS) w/Device KIT Use to check blood sugar 4 times daily dx code E10.65  . cholecalciferol (VITAMIN D) 1000 UNITS tablet Take 1,000 Units by mouth daily.  . clindamycin (CLINDAGEL) 1 % gel Reported on 11/22/2015  . diclofenac sodium (VOLTAREN) 1 % GEL APPLY 2 GRAMS EXTERNALLY TO THE AFFECTED AREA FOUR TIMES DAILY  . Ferrous Sulfate (IRON) 325 (65 Fe) MG TABS Take 1 tablet by mouth as needed.  . gabapentin (NEURONTIN) 100 MG capsule TAKE 1 TO 2 CAPSULES(100 TO 200 MG) BY MOUTH TWICE DAILY  . glucagon (GLUCAGON  EMERGENCY) 1 MG injection INJECT 1 MG INTO THE SKIN AT ONCE AS NEEDED  . glucose blood (ACCU-CHEK ACTIVE STRIPS) test strip Use to test blood sugar 6 times daily  . glucose blood (ACCU-CHEK AVIVA PLUS) test strip Use as instructed to check blood sugar 4 times per day dx code E10.65  . Insulin Glargine (LANTUS SOLOSTAR) 100 UNIT/ML Solostar Pen 8 units am and 4 in pm  . insulin lispro (HUMALOG) 100 UNIT/ML injection Sliding scale 10 units in am and 8 units at night (Patient taking differently: Inject 1-3 Units into the skin 2 (two) times daily as needed for high blood sugar. Sliding scale 10 units in am and 8 units at night)  . Insulin Pen Needle (B-D ULTRAFINE III SHORT PEN) 31G X 8 MM MISC INJECT INSULIN ONCE DAILY  . lamoTRIgine (LAMICTAL) 150 MG tablet Take 1 tablet (150 mg total) by mouth daily.  Elmore Guise Devices (ONE TOUCH DELICA LANCING DEV) MISC Check blood sugar 4 times daily.  Marland Kitchen loratadine (CLARITIN) 10 MG tablet Take 10 mg by mouth as needed for allergies.  Marland Kitchen lovastatin (MEVACOR) 40 MG tablet Take 1 tablet (40 mg total) by mouth at bedtime.  . Magnesium 500 MG TABS Take 1 tablet by mouth as needed.  . metoCLOPramide (REGLAN) 5 MG tablet TAKE 1 TABLET(5 MG) BY MOUTH THREE TIMES DAILY BEFORE MEALS  . Multiple Vitamin (MULTIVITAMIN)  tablet Take 1 tablet by mouth daily. Reported on 11/22/2015  . omeprazole (PRILOSEC) 40 MG capsule Take 1 capsule (40 mg total) by mouth daily.  . traMADol (ULTRAM) 50 MG tablet TAKE 1 TABLET BY MOUTH EVERY 12 HOURS AS NEEDED FOR MODERATE TO SEVERE PAIN  . trazodone (DESYREL) 300 MG tablet TAKE 1 AND 1/2 TABLETS(450 MG) BY MOUTH AT BEDTIME  . vitamin A 10000 UNIT capsule Take 10,000 Units by mouth daily.  . vitamin E (VITAMIN E) 1000 UNIT capsule Take 1,000 Units by mouth daily.    Marland Kitchen zinc gluconate 50 MG tablet Take 50 mg by mouth daily.   No facility-administered encounter medications on file as of 09/27/2016.     Allergies (verified) Penicillins and  Sulfonamide derivatives   History: Past Medical History:  Diagnosis Date  . ALLERGIC RHINITIS   . Anemia   . Anxiety   . Arthritis   . BIPOLAR AFFECTIVE DISORDER   . DEPRESSION   . Diabetes mellitus type I ([place])   . DIABETES MELLITUS, TYPE II    follows with endo  . Fibromyalgia   . GERD   . HEPATITIS B   . HYPERLIPIDEMIA   . LOW BACK PAIN, CHRONIC   . MIGRAINE HEADACHE   . NARCOTIC ABUSE    hx of  . NECK PAIN, CHRONIC   . Neuropathy ([place])   . SMOKER    Past Surgical History:  Procedure Laterality Date  . CESAREAN SECTION    . TUBAL LIGATION     Family History  Problem Relation Age of Onset  . Arthritis Mother   . Colon cancer Mother     ? age of dx  . Arthritis Father   . Kidney disease Father   . Kidney cancer Father   . Heart attack Brother   . Bladder Cancer Sister   . Hyperlipidemia Other   . Kidney cancer Paternal Aunt   . Anxiety disorder Neg Hx   . Bipolar disorder Neg Hx   . Depression Neg Hx    Social History   Occupational History  . diabled    Social History Main Topics  . Smoking status: Current Every Day Smoker    Packs/day: 0.50    Years: 40.00    Types: Cigarettes  . Smokeless tobacco: Never Used     Comment: tobacco info given 04/18/16  . Alcohol use 0.0 oz/week     Comment: occasionally- once every 2 weeks 1-2 beers  . Drug use: No  . Sexual activity: No    Tobacco Counseling Ready to quit: Not Answered Counseling given: Not Answered   Activities of Daily Living No flowsheet data found.  Immunizations and Health Maintenance Immunization History  Administered Date(s) Administered  . Influenza Split 03/22/2011, 03/26/2012  . Influenza,inj,Quad PF,36+ Mos 02/28/2013, 04/13/2014, 05/13/2015, 02/28/2016  . Pneumococcal Conjugate-13 07/24/2016  . Pneumococcal Polysaccharide-23 07/31/2013  . Td 07/28/2011  . Zoster 05/31/2016   Health Maintenance Due  Topic Date Due  . MAMMOGRAM  05/13/2016    Patient Care  Team: [name], MD as PCP - General (Internal Medicine) [name], MD as Consulting Physician (Endocrinology) Amil Amen, MD as Consulting Physician (Psychiatry) Ricard Dillon, MD as Consulting Physician (Psychiatry) Ladene Artist, MD (Gastroenterology) Franchot Gallo, MD (Urology)  Indicate any recent Medical Services you may have received from other than [facility] providers in the past year (date may be approximate).     Assessment:   This is a routine wellness examination for  Leslie Lane.Physical assessment deferred to PCP.   Hearing/Vision screen No exam data present  Dietary issues and exercise activities discussed:   Diet (meal preparation, eat out, water intake, caffeinated beverages, dairy products, fruits and vegetables): {Desc; diets:16563} Breakfast: Lunch:  Dinner:      Goals    None     Depression Screen PHQ 2/9 Scores 03/25/2015 04/13/2014 02/28/2013  PHQ - 2 Score 0 0 1    Fall Risk Fall Risk  03/25/2015 04/13/2014 02/28/2013  Falls in the past year? No No No    Cognitive Function:        Screening Tests Health Maintenance  Topic Date Due  . MAMMOGRAM  05/13/2016  . FOOT EXAM  11/16/2016  . HEMOGLOBIN A1C  12/04/2016  . URINE MICROALBUMIN  01/10/2017  . OPHTHALMOLOGY EXAM  05/17/2017  . PAP SMEAR  08/18/2017  . PNEUMOCOCCAL POLYSACCHARIDE VACCINE (2) 07/31/2018  . COLONOSCOPY  05/27/2019  . TETANUS/TDAP  07/27/2021  . INFLUENZA VACCINE  Completed  . Hepatitis C Screening  Completed  . HIV Screening  Completed      Plan:     During the course of the visit, Leslie Lane was educated and counseled about the following appropriate screening and preventive services:   Vaccines to include Pneumoccal, Influenza, Hepatitis B, Td, Zostavax, HCV  Cardiovascular disease screening  Colorectal cancer screening  Bone density screening  Diabetes screening  Glaucoma screening  Mammography/PAP  Nutrition counseling  Patient  Instructions (the written plan) were given to the patient.    Michiel Cowboy, RN   09/26/2016

## 2016-09-26 NOTE — Progress Notes (Deleted)
Pre visit review using our clinic review tool, if applicable. No additional management support is needed unless otherwise documented below in the visit note. 

## 2016-09-27 ENCOUNTER — Ambulatory Visit: Payer: Self-pay

## 2016-09-27 ENCOUNTER — Other Ambulatory Visit: Payer: Self-pay

## 2016-09-27 MED ORDER — BD SWAB SINGLE USE REGULAR PADS: MEDICATED_PAD | 2 refills | Status: DC

## 2016-09-27 MED ORDER — ACCU-CHEK AVIVA VI SOLN: 1 refills | Status: DC

## 2016-10-02 ENCOUNTER — Other Ambulatory Visit: Payer: Self-pay | Admitting: *Deleted

## 2016-10-02 MED ORDER — LOVASTATIN 40 MG PO TABS: 40.0000 mg | ORAL_TABLET | Freq: Every day | ORAL | 2 refills | Status: DC

## 2016-10-02 MED ORDER — OMEPRAZOLE 40 MG PO CPDR: 40.0000 mg | DELAYED_RELEASE_CAPSULE | Freq: Every day | ORAL | 2 refills | Status: DC

## 2016-10-05 ENCOUNTER — Ambulatory Visit (INDEPENDENT_AMBULATORY_CARE_PROVIDER_SITE_OTHER): Payer: Medicare HMO | Admitting: Psychiatry

## 2016-10-05 ENCOUNTER — Encounter (HOSPITAL_COMMUNITY): Payer: Self-pay | Admitting: Psychiatry

## 2016-10-05 DIAGNOSIS — Z794 Long term (current) use of insulin: Secondary | ICD-10-CM

## 2016-10-05 DIAGNOSIS — G4701 Insomnia due to medical condition: Secondary | ICD-10-CM | POA: Diagnosis not present

## 2016-10-05 DIAGNOSIS — F17210 Nicotine dependence, cigarettes, uncomplicated: Secondary | ICD-10-CM | POA: Diagnosis not present

## 2016-10-05 DIAGNOSIS — F4010 Social phobia, unspecified: Secondary | ICD-10-CM | POA: Diagnosis not present

## 2016-10-05 DIAGNOSIS — Z79899 Other long term (current) drug therapy: Secondary | ICD-10-CM | POA: Diagnosis not present

## 2016-10-05 DIAGNOSIS — F332 Major depressive disorder, recurrent severe without psychotic features: Secondary | ICD-10-CM | POA: Diagnosis not present

## 2016-10-05 MED ORDER — TRAZODONE HCL 300 MG PO TABS: ORAL_TABLET | ORAL | 2 refills | Status: DC

## 2016-10-05 MED ORDER — LAMOTRIGINE 150 MG PO TABS: 150.0000 mg | ORAL_TABLET | Freq: Every day | ORAL | 2 refills | Status: DC

## 2016-10-05 NOTE — Progress Notes (Signed)
[facility] MD/PA/NP OP Progress Note  10/05/2016 9:46 AM Leslie Lane  MRN:  [id number]  Chief Complaint:  Chief Complaint    Follow-up      HPI: Pt states she feels tired all the time. Sleep is good. Appetite is good and she is eating 3 meals a day but is still losing weight. She is not dieting.  Pt is not sure if she is depressed. Pt does not feel sad but notes she is has not interest or motivation to do anything. Reports some anhedonia. States she has fun once she does do something. Denies SI/HI.  Denies manic and hypomanic symptoms including periods of decreased need for sleep, increased energy, mood lability, impulsivity, FOI, and excessive spending.  Social anxiety is "alright". She doesn't go around a lot of people and will avoid crowds. Pt prefers to stay home most of the time.  Taking meds as prescribed and denies SE.     Visit Diagnosis:    ICD-9-CM ICD-10-CM   1. Insomnia due to medical condition 327.01 G47.01   2. Major depressive disorder, recurrent, severe without psychotic features ([place]) 296.33 F33.2   3. Social anxiety disorder 300.23 F40.10     Past Psychiatric History: see H&P  Past Medical History:  Past Medical History:  Diagnosis Date  . ALLERGIC RHINITIS   . Anemia   . Anxiety   . Arthritis   . BIPOLAR AFFECTIVE DISORDER   . DEPRESSION   . Diabetes mellitus type I ([place])   . DIABETES MELLITUS, TYPE II    follows with endo  . Fibromyalgia   . GERD   . HEPATITIS B   . HYPERLIPIDEMIA   . LOW BACK PAIN, CHRONIC   . MIGRAINE HEADACHE   . NARCOTIC ABUSE    hx of  . NECK PAIN, CHRONIC   . Neuropathy ([place])   . SMOKER     Past Surgical History:  Procedure Laterality Date  . CESAREAN SECTION    . TUBAL LIGATION      Family Psychiatric History:  Family History  Problem Relation Age of Onset  . Arthritis Mother   . Colon cancer Mother     ? age of dx  . Arthritis Father   . Kidney disease Father   . Kidney cancer Father   . Heart attack Brother    . Bladder Cancer Sister   . Hyperlipidemia Other   . Kidney cancer Paternal Aunt   . Anxiety disorder Neg Hx   . Bipolar disorder Neg Hx   . Depression Neg Hx     Social History:  Social History   Social History  . Marital status: Divorced    Spouse name: N/A  . Number of children: 1  . Years of education: N/A   Occupational History  . diabled    Social History Main Topics  . Smoking status: Current Every Day Smoker    Packs/day: 0.50    Years: 40.00    Types: Cigarettes  . Smokeless tobacco: Never Used     Comment: tobacco info given 04/18/16  . Alcohol use 0.0 oz/week     Comment: occasionally- once every 2 weeks 1-2 beers  . Drug use: No  . Sexual activity: No   Other Topics Concern  . None   Social History Narrative   Divorced, disabled   Lives with parents    Allergies:  Allergies  Allergen Reactions  . Penicillins Anaphylaxis  . Sulfonamide Derivatives Anaphylaxis    Metabolic Disorder Labs:  Lab Results  Component Value Date   HGBA1C 7.0 (H) 06/05/2016   No results found for: PROLACTIN Lab Results  Component Value Date   CHOL 178 01/11/2016   TRIG 63.0 01/11/2016   HDL 73.00 01/11/2016   CHOLHDL 2 01/11/2016   VLDL 12.6 01/11/2016   LDLCALC 92 01/11/2016   LDLCALC 82 06/14/2015     Current Medications: Current Outpatient Prescriptions  Medication Sig Dispense Refill  . ACCU-CHEK SOFTCLIX LANCETS lancets Use as instructed to check blood sugar 4 times per day dx code E10.65 400 each 1  . Alcohol Swabs (B-D SINGLE USE SWABS REGULAR) PADS Use to test blood sugar 4 times daily Dx code E10.65 200 each 2  . aspirin EC 81 MG tablet Take 81 mg by mouth every morning.    . B-D ULTRAFINE III SHORT PEN 31G X 8 MM MISC INJECT INSULIN ONCE DAILY 100 each 0  . Bioflavonoid Products (BIOFLEX PO) Take 1 tablet by mouth every morning. Reported on 11/22/2015    . Calcium Carb-Cholecalciferol (CALCIUM-VITAMIN D) 500-200 MG-UNIT tablet Take 1 tablet by mouth  daily.    . clindamycin (CLINDAGEL) 1 % gel Reported on 11/22/2015  1  . diclofenac sodium (VOLTAREN) 1 % GEL APPLY 2 GRAMS EXTERNALLY TO THE AFFECTED AREA FOUR TIMES DAILY 100 g 0  . gabapentin (NEURONTIN) 100 MG capsule TAKE 1 TO 2 CAPSULES(100 TO 200 MG) BY MOUTH TWICE DAILY 120 capsule 0  . glucagon (GLUCAGON EMERGENCY) 1 MG injection INJECT 1 MG INTO THE SKIN AT ONCE AS NEEDED 2 kit 5  . Insulin Glargine (LANTUS SOLOSTAR) 100 UNIT/ML Solostar Pen 8 units am and 4 in pm 5 pen PRN  . insulin lispro (HUMALOG) 100 UNIT/ML injection Sliding scale 10 units in am and 8 units at night (Patient taking differently: Inject 1-3 Units into the skin 2 (two) times daily as needed for high blood sugar. Sliding scale 10 units in am and 8 units at night) 30 mL 1  . lamoTRIgine (LAMICTAL) 150 MG tablet Take 1 tablet (150 mg total) by mouth daily. 90 tablet 1  . loratadine (CLARITIN) 10 MG tablet Take 10 mg by mouth as needed for allergies.    Marland Kitchen lovastatin (MEVACOR) 40 MG tablet Take 1 tablet (40 mg total) by mouth at bedtime. 90 tablet 2  . Magnesium 500 MG TABS Take 1 tablet by mouth as needed.    . Multiple Vitamin (MULTIVITAMIN) tablet Take 1 tablet by mouth daily. Reported on 11/22/2015    . omeprazole (PRILOSEC) 40 MG capsule Take 1 capsule (40 mg total) by mouth daily. 90 capsule 2  . traMADol (ULTRAM) 50 MG tablet TAKE 1 TABLET BY MOUTH EVERY 12 HOURS AS NEEDED FOR MODERATE TO SEVERE PAIN 60 tablet 2  . trazodone (DESYREL) 300 MG tablet TAKE 1 AND 1/2 TABLETS(450 MG) BY MOUTH AT BEDTIME 135 tablet 1  . vitamin A 10000 UNIT capsule Take 10,000 Units by mouth daily.    . vitamin E (VITAMIN E) 1000 UNIT capsule Take 1,000 Units by mouth daily.       No current facility-administered medications for this visit.       Musculoskeletal: Strength & Muscle Tone: within normal limits Gait & Station: normal Patient leans: N/A  Psychiatric Specialty Exam: Review of Systems  Gastrointestinal: Negative for  abdominal pain, heartburn, nausea and vomiting.  Neurological: Positive for tremors and headaches. Negative for dizziness, sensory change, seizures and loss of consciousness.  Psychiatric/Behavioral: Negative for depression, hallucinations, substance  abuse and suicidal ideas. The patient is nervous/anxious. The patient does not have insomnia.     Blood pressure 122/78, pulse 83, height 5' 4.25" (1.632 m), weight 116 lb 6.4 oz (52.8 kg).Body mass index is 19.82 kg/m.  General Appearance: Casual  Eye Contact:  Good  Speech:  Clear and Coherent and Normal Rate  Volume:  Normal  Mood:  Euthymic  Affect:  Full Range  Thought Process:  Goal Directed and Descriptions of Associations: Intact  Orientation:  Full (Time, Place, and Person)  Thought Content: Logical   Suicidal Thoughts:  No  Homicidal Thoughts:  No  Memory:  Immediate;   Good Recent;   Good Remote;   Good  Judgement:  Good  Insight:  Good  Psychomotor Activity:  Normal  Concentration:  Concentration: Good and Attention Span: Good  Recall:  Good  Fund of Knowledge: Good  Language: Good  Akathisia:  No  Handed:  Right  AIMS (if indicated):  n/a  Assets:  Communication Skills Desire for Improvement  ADL's:  Intact  Cognition: WNL  Sleep:  fair     Treatment Plan Summary:Medication management   Assessment: MDD- recurrent vs Bipolar disoder ; Social anxiety disorder, Insomnia; Nicotine dependence   Medication management with supportive therapy. Risks/benefits and SE of the medication discussed. Pt verbalized understanding and verbal consent obtained for treatment.  Affirm with the patient that the medications are taken as ordered. Patient expressed understanding of how their medications were to be used.     Meds:Lamicital 15m po qD for mood Trazodone 4578mpo qHS for mood, anxiety and insomnia  Pt declined treatment with Lithium and TCA's for augmentation or mood stabalization    Labs: none  Therapy: brief  supportive therapy provided. Discussed psychosocial stressors in detail.   Encouraged pt to develop daily routine and work on daily goal setting as a way to improve mood symptoms.    Consultations: Declined therapy   Pt denies SI and is at an acute low risk for suicide. Patient told to call clinic if any problems occur. Patient advised to go to ER if they should develop SI/HI, side effects, or if symptoms worsen. Has crisis numbers to call if needed. Pt verbalized understanding.  F/up in 3 months or sooner if needed     SaCharlcie CradleMD 10/05/2016, 9:46 AM

## 2016-10-11 ENCOUNTER — Ambulatory Visit
Admission: RE | Admit: 2016-10-11 | Discharge: 2016-10-11 | Disposition: A | Discharge status: Home / Self Care | Payer: Medicare HMO | Source: Ambulatory Visit | Attending: Internal Medicine | Admitting: Internal Medicine

## 2016-10-11 ENCOUNTER — Other Ambulatory Visit: Payer: Self-pay | Admitting: Internal Medicine

## 2016-10-11 DIAGNOSIS — Z1231 Encounter for screening mammogram for malignant neoplasm of breast: Secondary | ICD-10-CM | POA: Diagnosis not present

## 2016-10-20 ENCOUNTER — Other Ambulatory Visit (INDEPENDENT_AMBULATORY_CARE_PROVIDER_SITE_OTHER): Payer: Medicare HMO

## 2016-10-20 ENCOUNTER — Other Ambulatory Visit: Payer: Self-pay | Admitting: Internal Medicine

## 2016-10-20 DIAGNOSIS — E049 Nontoxic goiter, unspecified: Secondary | ICD-10-CM

## 2016-10-20 DIAGNOSIS — E7800 Pure hypercholesterolemia, unspecified: Secondary | ICD-10-CM

## 2016-10-20 DIAGNOSIS — E1065 Type 1 diabetes mellitus with hyperglycemia: Secondary | ICD-10-CM

## 2016-10-20 LAB — MICROALBUMIN / CREATININE URINE RATIO
Creatinine,U: 240.3 mg/dL
Microalb Creat Ratio: 1.6 mg/g (ref 0.0–30.0)
Microalb, Ur: 3.8 mg/dL — ABNORMAL HIGH (ref 0.0–1.9)

## 2016-10-20 LAB — LIPID PANEL
CHOL/HDL RATIO: 2
Cholesterol: 186 mg/dL (ref 0–200)
HDL: 88.1 mg/dL (ref >39.00)
LDL CALC: 84 mg/dL (ref 0–99)
NonHDL: 97.78
TRIGLYCERIDES: 67 mg/dL (ref 0.0–149.0)
VLDL: 13.4 mg/dL (ref 0.0–40.0)

## 2016-10-20 LAB — COMPREHENSIVE METABOLIC PANEL
ALT: 13 U/L (ref 0–35)
AST: 19 U/L (ref 0–37)
Albumin: 4 g/dL (ref 3.5–5.2)
Alkaline Phosphatase: 51 U/L (ref 39–117)
BUN: 12 mg/dL (ref 6–23)
CALCIUM: 9.5 mg/dL (ref 8.4–10.5)
CO2: 33 meq/L — AB (ref 19–32)
Chloride: 103 mEq/L (ref 96–112)
Creatinine, Ser: 0.83 mg/dL (ref 0.40–1.20)
GFR: 73.92 mL/min (ref >60.00)
Glucose, Bld: 180 mg/dL — ABNORMAL HIGH (ref 70–99)
Potassium: 4.2 mEq/L (ref 3.5–5.1)
Sodium: 141 mEq/L (ref 135–145)
Total Bilirubin: 0.5 mg/dL (ref 0.2–1.2)
Total Protein: 6.7 g/dL (ref 6.0–8.3)

## 2016-10-20 LAB — TSH: TSH: 1.38 u[IU]/mL (ref 0.35–4.50)

## 2016-10-20 LAB — HEMOGLOBIN A1C: Hgb A1c MFr Bld: 6.9 % — ABNORMAL HIGH (ref 4.6–6.5)

## 2016-10-24 ENCOUNTER — Ambulatory Visit (INDEPENDENT_AMBULATORY_CARE_PROVIDER_SITE_OTHER): Payer: Medicare HMO | Admitting: Endocrinology

## 2016-10-24 ENCOUNTER — Encounter: Payer: Self-pay | Admitting: Endocrinology

## 2016-10-24 VITALS — BP 112/70 | HR 74 | Ht 64.0 in | Wt 115.0 lb

## 2016-10-24 DIAGNOSIS — K3184 Gastroparesis: Secondary | ICD-10-CM

## 2016-10-24 DIAGNOSIS — E1043 Type 1 diabetes mellitus with diabetic autonomic (poly)neuropathy: Secondary | ICD-10-CM | POA: Diagnosis not present

## 2016-10-24 DIAGNOSIS — T383X5A Adverse effect of insulin and oral hypoglycemic [antidiabetic] drugs, initial encounter: Secondary | ICD-10-CM | POA: Diagnosis not present

## 2016-10-24 DIAGNOSIS — E1065 Type 1 diabetes mellitus with hyperglycemia: Secondary | ICD-10-CM | POA: Diagnosis not present

## 2016-10-24 DIAGNOSIS — E160 Drug-induced hypoglycemia without coma: Secondary | ICD-10-CM | POA: Diagnosis not present

## 2016-10-24 HISTORY — DX: Type 1 diabetes mellitus with diabetic autonomic (poly)neuropathy: E10.43

## 2016-10-24 MED ORDER — METOCLOPRAMIDE HCL 5 MG PO TABS: ORAL_TABLET | ORAL | 1 refills | Status: DC

## 2016-10-24 MED ORDER — INSULIN DEGLUDEC 100 UNIT/ML ~~LOC~~ SOPN: 15.0000 [IU] | PEN_INJECTOR | Freq: Every day | SUBCUTANEOUS | 1 refills | Status: DC

## 2016-10-24 NOTE — Patient Instructions (Addendum)
Tresiba 10 units daily in am only; and go up only if am sugar is over 150 after 5 days  May take 1 unit humalog if eating a lot of carbs

## 2016-10-24 NOTE — Progress Notes (Signed)
Patient ID: Leslie Lane, female   DOB: [birth year], 61 y.o.   MRN: [id number]   Reason for Appointment: Diabetes follow-up   History of Present Illness    Diagnosis: Type 1 DIABETES MELITUS, diagnosed 1967      She has had labile blood sugar control over the last several years even though A1c has been usually around 7% She has had less lability and hypoglycemia with taking b.i.d. Lantus compared once a day She has been very sensitive to fast acting insulin and frequently does not require mealtime coverage She cannot tolerate Toujeo because of reported episode of headache, bloating and nausea with the first dose  RECENT history:  Insulin regimen: Lantus with SoloSTAR pen 7-8 units at 8-9 a.m., 4 units h.s. Humalog 1-2 p.r.n.   She is  taking Lantus twice a day with smaller amount in the evening; she is supposed take 7 units but sometimes takes 8 units  Her A1c is 6.9, previously 7%  Current blood sugar patterns and problems identified:  She is still having significant hypoglycemia periodically with blood sugars as low as 28  Hypoglycemia is occurring at various times including at bedtime but not before breakfast  She says she has lower sugars when she is more active and she does not compensate with increased carbohydrate intake for this; also she thinks her sugars are lower when she gets stressed  She is checking her sugars nearly 5 times a day  HIGHEST blood sugars are overall in the morning on waking up and sometimes late at night  Lowest blood sugars overall are in the afternoons  She will take extra Humalog only occasionally when blood sugars are significantly high    however she will not take any she is eating more snacks at night which she thinks makes her sugar go up overnight   she tends to overeat  low blood sugars as she gets excessively hungry.  HYPOGLYCEMIA: As above, may not have symptoms with low blood sugars and does not have early warning  symptoms. Gets confused frequently and she depends on her mother to recognize low sugars and treat them. Her mother knows how to give Glucagon injection She will treat her low blood sugars usually with juice, does not like glucose tablets; sometimes will eat food at the same time      DIET: She is trying to get protein with eggs in the morning Meal times: dinner 5-6 p.m.Marland Kitchen  She will eat more when she is anxious  Monitors blood glucose:  about 4-5 times a day.    Glucometer:   Accu-Chek Aviva  Blood Glucose readings from meter download as follows  Mean values apply above for all meters except median for One Touch  PRE-MEAL Fasting Lunch Dinner Bedtime Overall  Glucose range:  95-254   28-1 98   40-1 94   31-235  28-280  Mean/median: 174  126  128  145  136+/-61      Wt Readings from Last 3 Encounters:  10/24/16 115 lb (52.2 kg)  07/24/16 120 lb (54.4 kg)  06/08/16 122 lb (55.3 kg)    Lab Results  Component Value Date   HGBA1C 6.9 (H) 10/20/2016   HGBA1C 7.0 (H) 06/05/2016   HGBA1C 7.0 (H) 11/12/2015   Lab Results  Component Value Date   MICROALBUR 3.8 (H) 10/20/2016   LDLCALC 84 10/20/2016   CREATININE 0.83 10/20/2016        Allergies as of 10/24/2016  Reactions   Penicillins Anaphylaxis   Sulfonamide Derivatives Anaphylaxis      Medication List       Accurate as of 10/24/16  1:06 PM. Always use your most recent med list.          ACCU-CHEK SOFTCLIX LANCETS lancets Use as instructed to check blood sugar 4 times per day dx code E10.65   aspirin EC 81 MG tablet Take 81 mg by mouth every morning.   B-D SINGLE USE SWABS REGULAR Pads Use to test blood sugar 4 times daily Dx code E10.65   B-D ULTRAFINE III SHORT PEN 31G X 8 MM Misc Generic drug:  Insulin Pen Needle INJECT INSULIN ONCE DAILY   BIOFLEX PO Take 1 tablet by mouth every morning. Reported on 11/22/2015   calcium-vitamin D 500-200 MG-UNIT tablet Take 1 tablet by mouth daily.   clindamycin 1  % gel Commonly known as:  CLINDAGEL Reported on 11/22/2015   diclofenac sodium 1 % Gel Commonly known as:  VOLTAREN APPLY 2 GRAMS EXTERNALLY TO THE AFFECTED AREA FOUR TIMES DAILY   gabapentin 100 MG capsule Commonly known as:  NEURONTIN TAKE 1 TO 2 CAPSULES(100 TO 200 MG) BY MOUTH TWICE DAILY   glucagon 1 MG injection Commonly known as:  GLUCAGON EMERGENCY INJECT 1 MG INTO THE SKIN AT ONCE AS NEEDED   insulin degludec 100 UNIT/ML Sopn FlexTouch Pen Commonly known as:  TRESIBA FLEXTOUCH Inject 0.15 mLs (15 Units total) into the skin daily.   Insulin Glargine 100 UNIT/ML Solostar Pen Commonly known as:  LANTUS SOLOSTAR 8 units am and 4 in pm   insulin lispro 100 UNIT/ML injection Commonly known as:  HUMALOG Sliding scale 10 units in am and 8 units at night   lamoTRIgine 150 MG tablet Commonly known as:  LAMICTAL Take 1 tablet (150 mg total) by mouth daily.   loratadine 10 MG tablet Commonly known as:  CLARITIN Take 10 mg by mouth as needed for allergies.   lovastatin 40 MG tablet Commonly known as:  MEVACOR Take 1 tablet (40 mg total) by mouth at bedtime.   Magnesium 500 MG Tabs Take 1 tablet by mouth as needed.   multivitamin tablet Take 1 tablet by mouth daily. Reported on 11/22/2015   omeprazole 40 MG capsule Commonly known as:  PRILOSEC Take 1 capsule (40 mg total) by mouth daily.   traMADol 50 MG tablet Commonly known as:  ULTRAM TAKE 1 TABLET BY MOUTH EVERY 12 HOURS AS NEEDED FOR MODERATE TO SEVERE PAIN   trazodone 300 MG tablet Commonly known as:  DESYREL TAKE 1 AND 1/2 TABLETS(450 MG) BY MOUTH AT BEDTIME   vitamin A 10000 UNIT capsule Take 10,000 Units by mouth daily.   vitamin E 1000 UNIT capsule Generic drug:  vitamin E Take 1,000 Units by mouth daily.       Allergies:  Allergies  Allergen Reactions  . Penicillins Anaphylaxis  . Sulfonamide Derivatives Anaphylaxis    Past Medical History:  Diagnosis Date  . ALLERGIC RHINITIS   .  Anemia   . Anxiety   . Arthritis   . BIPOLAR AFFECTIVE DISORDER   . DEPRESSION   . Diabetes mellitus type I ([place])   . DIABETES MELLITUS, TYPE II    follows with endo  . Fibromyalgia   . GERD   . HEPATITIS B   . HYPERLIPIDEMIA   . LOW BACK PAIN, CHRONIC   . MIGRAINE HEADACHE   . NARCOTIC ABUSE    hx of  .  NECK PAIN, CHRONIC   . Neuropathy   . SMOKER     Past Surgical History:  Procedure Laterality Date  . CESAREAN SECTION    . TUBAL LIGATION      Family History  Problem Relation Age of Onset  . Arthritis Mother   . Colon cancer Mother     ? age of dx  . Arthritis Father   . Kidney disease Father   . Kidney cancer Father   . Heart attack Brother   . Bladder Cancer Sister   . Hyperlipidemia Other   . Kidney cancer Paternal Aunt   . Anxiety disorder Neg Hx   . Bipolar disorder Neg Hx   . Depression Neg Hx   . Breast cancer Neg Hx     Social History:  reports that she has been smoking Cigarettes.  She has a 20.00 pack-year smoking history. She has never used smokeless tobacco. She reports that she drinks alcohol. She reports that she does not use drugs.  Review of Systems:  She is having relief of her nausea with Reglan  NEUROPATHY: She has burning in her legs and feet at times She is taking gabapentin and Voltaren gel with  relief   DEPRESSION: She has had long-standing depression and anxiety with variable level of control and is currently on Lamictal   HYPERLIPIDEMIA: The lipid abnormality consists of elevated LDL   Is  on lovastatin 40 mg with  good control   Lab Results  Component Value Date   CHOL 186 10/20/2016   HDL 88.10 10/20/2016   LDLCALC 84 10/20/2016   LDLDIRECT 101.7 03/26/2012   TRIG 67.0 10/20/2016   CHOLHDL 2 10/20/2016     Has history of small goiter but no History of hypothyroidism  Lab Results  Component Value Date   TSH 1.38 10/20/2016       Examination:   BP 112/70   Pulse 74   Ht 5\' 4"  (1.626 m)   Wt 115 lb (52.2  kg)   BMI 19.74 kg/m   Body mass index is 19.74 kg/m.    ASSESSMENT/ PLAN:   Diabetes type 1 See history of present illness for detailed discussion of current insulin, blood sugar patterns, problems identified She is being treated long-term with basal insulin only and as needed Humalog for significantly high readings and no mealtime coverage She has severe hypoglycemia unawareness and blood sugars can be as low as 28 with minimal or no symptoms  Last A1c was explored 9, previously 7%  Discussed that she can probably get more even blood sugar control and less variability and hypoglycemia with Tyler Aas once a day and discussed how this would be used She can start with 10 units once a day in the morning and may need additional increase by 1-2 units after at least a week She will check her fasting readings and adjust the dose if needed He can take Humalog when she is eating a lot of high fat/ high carbohydrate snacks late at night She needs to take an extra snack anytime she is trying to be more active  She was shown the use of the DexCom continuous glucose sensor which should be helpful in her monitoring her blood sugars more consistently and this will alert her to trending low blood sugars and hopefully avoid as much hypoglycemia She will check with the insurance coverage for this  Probable  gastroparesis: She can continue to take Reglan before meals   Hyperlipidemia: Well controlled as of 1/18  Patient Instructions  Tyler Aas 10 units daily in am only; and go up only if am sugar is over 150 after 5 days  May take 1 unit humalog if eating a lot of carbs    Counseling time on subjects discussed above is over 50% of today's 25 minute visit   Mikenzi Raysor 10/24/2016, 1:06 PM

## 2016-11-10 ENCOUNTER — Telehealth: Payer: Self-pay | Admitting: Endocrinology

## 2016-11-10 ENCOUNTER — Telehealth: Payer: Self-pay

## 2016-11-10 NOTE — Telephone Encounter (Signed)
Called patient, she discussed her questions and answered her own question while on the phone with her. She states she was having low blood sugar, but ate breakfast and is now feeling better. Patient had no questions for me at this time.

## 2016-11-10 NOTE — Telephone Encounter (Signed)
°  Patient stated when she give her her injections the needles will not puncture her skin, she is using the  Insulin Glargine (LANTUS SOLOSTAR) 100 UNIT/ML Solostar Pen  She also has other questions. Please advise

## 2016-11-22 ENCOUNTER — Ambulatory Visit (INDEPENDENT_AMBULATORY_CARE_PROVIDER_SITE_OTHER): Payer: Medicare HMO | Admitting: Internal Medicine

## 2016-11-22 ENCOUNTER — Encounter: Payer: Self-pay | Admitting: Internal Medicine

## 2016-11-22 DIAGNOSIS — IMO0002 Reserved for concepts with insufficient information to code with codable children: Secondary | ICD-10-CM

## 2016-11-22 DIAGNOSIS — E1065 Type 1 diabetes mellitus with hyperglycemia: Secondary | ICD-10-CM

## 2016-11-22 DIAGNOSIS — E108 Type 1 diabetes mellitus with unspecified complications: Secondary | ICD-10-CM | POA: Diagnosis not present

## 2016-11-22 MED ORDER — PANCRELIPASE (LIP-PROT-AMYL) 36000-114000 UNITS PO CPEP: 72000.0000 [IU] | ORAL_CAPSULE | Freq: Three times a day (TID) | ORAL | 3 refills | Status: DC

## 2016-11-22 NOTE — Patient Instructions (Addendum)
With the new insulin you should not take it at night time.   Take it 8 units in the morning and monitor the sugars and call Dr. Ronnie Derby office if the sugars are low.   We have sent in creon to take with meals. Take 2 pills with meals and 1 pill with any snack to help stop the diarrhea.

## 2016-11-22 NOTE — Progress Notes (Signed)
   Subjective:    Patient ID: Leslie Lane, female    DOB: 1955-09-05, 61 y.o.   MRN: [id number]  HPI The patient is a 61 YO female coming in for follow up but with acute concerns about hypoglycemia. Her endocrinologist has changed her insulin from lantus to tresiba and she is supposed to be taking only 10 units per day of this in the computer however she is taking 10 units tresiba in the morning and 4 units at night time. She is having some low sugars in the afternoons and evenings. Her appetite is poor and she has to force herself to eat. If she does not she will get low sugars. She denies fevers or chills. She is not taking any mealtime insulins at this time. Lowest sugar in the 40s and felt bad with that. At least 3-4 in the last week alone that were <60.   Review of Systems  Constitutional: Positive for activity change, appetite change and fatigue. Negative for fever and unexpected weight change.  Respiratory: Negative.   Cardiovascular: Negative.   Gastrointestinal: Positive for nausea. Negative for abdominal distention, abdominal pain, constipation, diarrhea and vomiting.  Endocrine: Negative for cold intolerance, heat intolerance, polydipsia, polyphagia and polyuria.  Neurological: Positive for weakness and numbness. Negative for dizziness, tremors, seizures, syncope, light-headedness and headaches.      Objective:   Physical Exam  Constitutional: She is oriented to person, place, and time. She appears well-developed and well-nourished.  HENT:  Head: Normocephalic and atraumatic.  Eyes: EOM are normal.  Neck: Normal range of motion.  Cardiovascular: Normal rate and regular rhythm.   Pulmonary/Chest: Effort normal and breath sounds normal.  Smelling of tobacco smoke  Abdominal: Soft. She exhibits no distension. There is no tenderness. There is no rebound.  Neurological: She is alert and oriented to person, place, and time.  Skin: Skin is warm.   Vitals:   11/22/16 0956  BP:  110/60  Pulse: 81  Resp: 12  Temp: 98.2 F (36.8 C)  TempSrc: Oral  SpO2: 98%  Weight: 114 lb (51.7 kg)  Height: 5\' 4"  (1.626 m)      Assessment & Plan:

## 2016-11-23 ENCOUNTER — Telehealth: Payer: Self-pay | Admitting: *Deleted

## 2016-11-23 NOTE — Telephone Encounter (Signed)
Pt left msg on triage requesting refills on her Tramadol, Gabapentin, and omeprazole...Leslie Lane

## 2016-11-24 MED ORDER — OMEPRAZOLE 40 MG PO CPDR: 40.0000 mg | DELAYED_RELEASE_CAPSULE | Freq: Every day | ORAL | 1 refills | Status: DC

## 2016-11-24 MED ORDER — GABAPENTIN 100 MG PO CAPS: ORAL_CAPSULE | ORAL | 1 refills | Status: DC

## 2016-11-24 MED ORDER — TRAMADOL HCL 50 MG PO TABS: 50.0000 mg | ORAL_TABLET | Freq: Two times a day (BID) | ORAL | 2 refills | Status: DC | PRN

## 2016-11-24 NOTE — Telephone Encounter (Signed)
Called pt no answer LMOM rx's sent to [facility]...Leslie Lane

## 2016-11-24 NOTE — Telephone Encounter (Signed)
Printed tramadol, okay to fill gabapentin and omeprazole.

## 2016-11-26 NOTE — Assessment & Plan Note (Addendum)
She is seeing endocrinology but since she is having acute problems this was addressed today. Reinforced correct dosing which is decreased to 8 units tresiba in the morning due to multiple low sugars in the last weeks and instructed to call endocrinology for more advice if sugars maintain low. Recommended to begin eating at least small regular meals to help maintain more stable sugar levels. She is having complications such as neuropathy, gastroparesis and they will likely worsen if her control is poor and she is aware and will work on making changes.

## 2016-12-06 ENCOUNTER — Ambulatory Visit (INDEPENDENT_AMBULATORY_CARE_PROVIDER_SITE_OTHER): Payer: Medicare HMO | Admitting: Endocrinology

## 2016-12-06 ENCOUNTER — Encounter: Payer: Self-pay | Admitting: Endocrinology

## 2016-12-06 VITALS — BP 132/62 | HR 70 | Temp 97.7°F | Ht 64.0 in | Wt 116.4 lb

## 2016-12-06 DIAGNOSIS — E1042 Type 1 diabetes mellitus with diabetic polyneuropathy: Secondary | ICD-10-CM

## 2016-12-06 DIAGNOSIS — E1065 Type 1 diabetes mellitus with hyperglycemia: Secondary | ICD-10-CM | POA: Diagnosis not present

## 2016-12-06 NOTE — Patient Instructions (Signed)
Tresiba 8 units daily  Call if am sugar stays over 150 in am  Must have a SNACK AT 8-9 PM especially when active

## 2016-12-06 NOTE — Progress Notes (Signed)
Patient ID: Leslie Lane, female   DOB: 1956/06/27, 61 y.o.   MRN: [id number]   Reason for Appointment: Diabetes follow-up   History of Present Illness    Diagnosis: Type 1 DIABETES MELITUS, diagnosed 1967      She has had labile blood sugar control over the last several years even though A1c has been usually around 7% She has had less lability and hypoglycemia with taking b.i.d. Lantus compared once a day She has been very sensitive to fast acting insulin and frequently does not require mealtime coverage She cannot tolerate Toujeo because of reported episode of headache, bloating and nausea with the first dose  RECENT history:  Insulin regimen: Tresiba 10 units daily, Humalog 1-2 p.r.n.   She is since 4/18 taking C by instead of taking Lantus twice a day    Her A1c in April was 6.9, previously 7%  Current blood sugar patterns and problems identified:  She is still having significant hypoglycemia   Was prescribed only 10 units to see about daily but she continued to take another 4 units in the evening as she forgot her instructions until she was seen by PCP about 2 weeks ago  Patient has had almost daily hypoglycemia now in the last month or so  Also even with her PCP advising her to take 8 units of Tyler Aas she is taking 10 units and is still getting hypoglycemia  Also most of her hypoglycemia appears to be occurring recently in the evenings when she is more active and she does not have a snack to prevent this; occasionally will have less protein also at suppertime and evening  She does not usually have high blood sugars much except with a rebound from low sugar or larger carbohydrate meal  Today she had a reading of 224 at lunchtime, normally has protein at breakfast but today only had waffles with peanut butter  Again she will snack at times causing variability in blood sugars.  HYPOGLYCEMIA: As above, may not have symptoms with low blood sugars and does not  have early warning symptoms. Gets confused frequently and she depends on her mother to recognize low sugars and treat them. Her mother knows how to give Glucagon injection She will treat her low blood sugars usually with juice, does not like glucose tablets; sometimes will eat food at the same time      DIET: She is trying to get protein with eggs in the morning Meal times: dinner 5-6 p.m.Marland Kitchen  She will eat more when she is anxious  Monitors blood glucose:  about 4-5 times a day.    Glucometer:   Accu-Chek Aviva  Blood Glucose readings from meter download as follows  Mean values apply above for all meters except median for One Touch  PRE-MEAL Fasting Lunch Dinner Bedtime Overall  Glucose range:  40-275   43-227   38-242  37-152   Mean/median: 118  121  117  80  110     Wt Readings from Last 3 Encounters:  12/06/16 116 lb 6.4 oz (52.8 kg)  11/22/16 114 lb (51.7 kg)  10/24/16 115 lb (52.2 kg)    Lab Results  Component Value Date   HGBA1C 6.9 (H) 10/20/2016   HGBA1C 7.0 (H) 06/05/2016   HGBA1C 7.0 (H) 11/12/2015   Lab Results  Component Value Date   MICROALBUR 3.8 (H) 10/20/2016   LDLCALC 84 10/20/2016   CREATININE 0.83 10/20/2016        Allergies as of  12/06/2016      Reactions   Penicillins Anaphylaxis   Sulfonamide Derivatives Anaphylaxis      Medication List       Accurate as of 12/06/16  4:59 PM. Always use your most recent med list.          ACCU-CHEK SOFTCLIX LANCETS lancets Use as instructed to check blood sugar 4 times per day dx code E10.65   aspirin EC 81 MG tablet Take 81 mg by mouth every morning.   B-D SINGLE USE SWABS REGULAR Pads Use to test blood sugar 4 times daily Dx code E10.65   B-D ULTRAFINE III SHORT PEN 31G X 8 MM Misc Generic drug:  Insulin Pen Needle INJECT INSULIN ONCE DAILY   BIOFLEX PO Take 1 tablet by mouth every morning. Reported on 11/22/2015   calcium-vitamin D 500-200 MG-UNIT tablet Take 1 tablet by mouth daily.     clindamycin 1 % gel Commonly known as:  CLINDAGEL Reported on 11/22/2015   diclofenac sodium 1 % Gel Commonly known as:  VOLTAREN APPLY 2 GRAMS EXTERNALLY TO THE AFFECTED AREA FOUR TIMES DAILY   gabapentin 100 MG capsule Commonly known as:  NEURONTIN TAKE 1 TO 2 CAPSULES(100 TO 200 MG) BY MOUTH TWICE DAILY   glucagon 1 MG injection Commonly known as:  GLUCAGON EMERGENCY INJECT 1 MG INTO THE SKIN AT ONCE AS NEEDED   insulin degludec 100 UNIT/ML Sopn FlexTouch Pen Commonly known as:  TRESIBA FLEXTOUCH Inject 0.15 mLs (15 Units total) into the skin daily.   insulin lispro 100 UNIT/ML injection Commonly known as:  HUMALOG Sliding scale 10 units in am and 8 units at night   lamoTRIgine 150 MG tablet Commonly known as:  LAMICTAL Take 1 tablet (150 mg total) by mouth daily.   lipase/protease/amylase 36000 UNITS Cpep capsule Commonly known as:  CREON Take 2 capsules (72,000 Units total) by mouth 3 (three) times daily before meals.   loratadine 10 MG tablet Commonly known as:  CLARITIN Take 10 mg by mouth as needed for allergies.   lovastatin 40 MG tablet Commonly known as:  MEVACOR Take 1 tablet (40 mg total) by mouth at bedtime.   Magnesium 500 MG Tabs Take 1 tablet by mouth as needed.   metoCLOPramide 5 MG tablet Commonly known as:  REGLAN TAKE 1 TABLET(5 MG) BY MOUTH THREE TIMES DAILY BEFORE MEALS   multivitamin tablet Take 1 tablet by mouth daily. Reported on 11/22/2015   omeprazole 40 MG capsule Commonly known as:  PRILOSEC Take 1 capsule (40 mg total) by mouth daily.   traMADol 50 MG tablet Commonly known as:  ULTRAM Take 1 tablet (50 mg total) by mouth every 12 (twelve) hours as needed.   trazodone 300 MG tablet Commonly known as:  DESYREL TAKE 1 AND 1/2 TABLETS(450 MG) BY MOUTH AT BEDTIME   vitamin A 10000 UNIT capsule Take 10,000 Units by mouth daily.   vitamin E 1000 UNIT capsule Generic drug:  vitamin E Take 1,000 Units by mouth daily.        Allergies:  Allergies  Allergen Reactions  . Penicillins Anaphylaxis  . Sulfonamide Derivatives Anaphylaxis    Past Medical History:  Diagnosis Date  . ALLERGIC RHINITIS   . Anemia   . Anxiety   . Arthritis   . BIPOLAR AFFECTIVE DISORDER   . DEPRESSION   . Diabetes mellitus type I ([place])   . DIABETES MELLITUS, TYPE II    follows with endo  . Fibromyalgia   .  GERD   . HEPATITIS B   . HYPERLIPIDEMIA   . LOW BACK PAIN, CHRONIC   . MIGRAINE HEADACHE   . NARCOTIC ABUSE    hx of  . NECK PAIN, CHRONIC   . Neuropathy   . SMOKER     Past Surgical History:  Procedure Laterality Date  . CESAREAN SECTION    . TUBAL LIGATION      Family History  Problem Relation Age of Onset  . Arthritis Mother   . Colon cancer Mother        ? age of dx  . Arthritis Father   . Kidney disease Father   . Kidney cancer Father   . Heart attack Brother   . Bladder Cancer Sister   . Hyperlipidemia Other   . Kidney cancer Paternal Aunt   . Anxiety disorder Neg Hx   . Bipolar disorder Neg Hx   . Depression Neg Hx   . Breast cancer Neg Hx     Social History:  reports that she has been smoking Cigarettes.  She has a 20.00 pack-year smoking history. She has never used smokeless tobacco. She reports that she drinks alcohol. She reports that she does not use drugs.  Review of Systems:  Has probable gastroparesis treated with Reglan  NEUROPATHY: She has burning in her legs and feet at times She is taking gabapentin 100 mg, 1-2 capsules at a time but complaining of increased burning now Also has burning during the night Her mother says that she had dizziness with 300 mg previously   DEPRESSION: She has had long-standing depression and anxiety with variable level of control and is currently on Lamictal   HYPERLIPIDEMIA: The lipid abnormality consists of elevated LDL   Is  on lovastatin 40 mg with  good control   Lab Results  Component Value Date   CHOL 186 10/20/2016   HDL 88.10  10/20/2016   LDLCALC 84 10/20/2016   LDLDIRECT 101.7 03/26/2012   TRIG 67.0 10/20/2016   CHOLHDL 2 10/20/2016     Has history of small goiter but no History of hypothyroidism  Lab Results  Component Value Date   TSH 1.38 10/20/2016       Examination:   BP 132/62 (BP Location: Left Arm, Patient Position: Sitting, Cuff Size: Normal)   Pulse 70   Temp 97.7 F (36.5 C) (Oral)   Ht 5\' 4"  (1.626 m)   Wt 116 lb 6.4 oz (52.8 kg)   SpO2 97%   BMI 19.98 kg/m   Body mass index is 19.98 kg/m.    ASSESSMENT/ PLAN:   Diabetes type 1 See history of present illness for detailed discussion of current insulin, blood sugar patterns, problems identified She is being treated long-term with basal insulin only and as needed Humalog for significantly high readings and no mealtime coverage  She has severe hypoglycemia unawareness   Patient has had almost daily hypoglycemia now in the last month or so This is partly related to her not following instructions for her doses of Tyler Aas that was started on her last visit Also even with her PCP advising her to take 8 units of Tyler Aas she is taking 10 units and is still getting hypoglycemia Also most of her hypoglycemia appears to be occurring recently in the evenings when she is more active and she does not have a snack to prevent this; occasionally will have less protein also at suppertime and evening  Recommended that she cut her Tyler Aas down to 8 units  Since she has hypoglycemia now mostly with increased activity in the evening she needs to have a snack by about 8 PM, at least 3 hours after her evening meal with some protein Also needs to add some protein with her evening meal consistently Since Tyler Aas is a 24-hour insulin and it did not make any different whether she takes it in the morning or evening Again discussed the use of the DexCom sensor which she has not looked into and explained to her and her mother today how this be used and benefits  of using this She will look into this further but if it is too expensive will need to consider the freestyle Libre Discussed blood sugar targets She can still try to take 1-2 units of Humalog for significantly high carbohydrate meals such as waffles Also she will need to call us if her fasting readings are consistently over 150  Neuropathy: Advised her that she can try taking 300 mg gabapentin at night and if tolerated may also change her prescription to the 300 mg capsule  Patient Instructions  Tresiba 8 units daily  Call if am sugar stays over 150 in am  Must have a SNACK AT 8-9 PM especially when active    Counseling time on subjects discussed above is over 50% of today's 25 minute visit   Rebie Peale 12/06/2016, 4:59 PM

## 2016-12-19 ENCOUNTER — Other Ambulatory Visit: Payer: Self-pay | Admitting: Endocrinology

## 2017-01-04 ENCOUNTER — Encounter (HOSPITAL_COMMUNITY): Payer: Self-pay | Admitting: Psychiatry

## 2017-01-04 ENCOUNTER — Ambulatory Visit (INDEPENDENT_AMBULATORY_CARE_PROVIDER_SITE_OTHER): Payer: Medicare HMO | Admitting: Psychiatry

## 2017-01-04 DIAGNOSIS — G4701 Insomnia due to medical condition: Secondary | ICD-10-CM | POA: Diagnosis not present

## 2017-01-04 DIAGNOSIS — F17210 Nicotine dependence, cigarettes, uncomplicated: Secondary | ICD-10-CM | POA: Diagnosis not present

## 2017-01-04 DIAGNOSIS — R45851 Suicidal ideations: Secondary | ICD-10-CM

## 2017-01-04 DIAGNOSIS — F4010 Social phobia, unspecified: Secondary | ICD-10-CM

## 2017-01-04 DIAGNOSIS — F332 Major depressive disorder, recurrent severe without psychotic features: Secondary | ICD-10-CM | POA: Diagnosis not present

## 2017-01-04 MED ORDER — TRAZODONE HCL 300 MG PO TABS: ORAL_TABLET | ORAL | 0 refills | Status: DC

## 2017-01-04 MED ORDER — LAMOTRIGINE 100 MG PO TABS: 100.0000 mg | ORAL_TABLET | Freq: Two times a day (BID) | ORAL | 0 refills | Status: DC

## 2017-01-04 NOTE — Progress Notes (Signed)
BH MD/PA/NP OP Progress Note  01/04/2017 8:54 AM [address]  MRN:  [id number]  Chief Complaint:  Chief Complaint    Follow-up     HPI: Pt states she has no energy and doesn't feel like doing anything. She continues to feel hopeless and worthless. She has continued anhedonia and isolation. She has passive SI without plan or intent. The thought occur when she is bored and has nothing to do. Pt denies HI/AVH. She spends all her time watching tv in bed.  Pt is sleeping well with Trazodone. Appetite is good.   Denies manic and hypomanic symptoms including periods of decreased need for sleep, increased energy, mood lability, impulsivity, FOI, and excessive spending.  Pt continues to avoid all social activities because they make her nervous and doesn't know what to say. Pt does have at least 2 friends and she spends some time with them occasionally.  Taking meds as prescribed and denies SE.   Visit Diagnosis:    ICD-10-CM   1. Insomnia due to medical condition G47.01 trazodone (DESYREL) 300 MG tablet  2. Major depressive disorder, recurrent, severe without psychotic features ([place]) F33.2 lamoTRIgine (LAMICTAL) 100 MG tablet  3. Social anxiety disorder F40.10 lamoTRIgine (LAMICTAL) 100 MG tablet      Past Psychiatric History:  Anxiety: Yes Bipolar Disorder: No Depression: Yes Mania: No Psychosis: No Schizophrenia: No Personality Disorder: No  Hospitalization for psychiatric illness: No History of Electroconvulsive Shock Therapy: No Prior Suicide Attempts: Yes Seroquel, Trazodone-ineffective  Lexapro  Depakote  Effexor and Cymbalta- ineffective  Wellbutrin- ineffective  Abilify- ineffective  Paxil-effectivebut the second time it was prescribed         Past Medical History:  Past Medical History:  Diagnosis Date  . ALLERGIC RHINITIS   . Anemia   . Anxiety   . Arthritis   . BIPOLAR AFFECTIVE DISORDER   . DEPRESSION   . Diabetes mellitus type I ([place])   .  DIABETES MELLITUS, TYPE II    follows with endo  . Fibromyalgia   . GERD   . HEPATITIS B   . HYPERLIPIDEMIA   . LOW BACK PAIN, CHRONIC   . MIGRAINE HEADACHE   . NARCOTIC ABUSE    hx of  . NECK PAIN, CHRONIC   . Neuropathy   . SMOKER     Past Surgical History:  Procedure Laterality Date  . CESAREAN SECTION    . TUBAL LIGATION      Family Psychiatric History:  Family History  Problem Relation Age of Onset  . Arthritis Mother   . Colon cancer Mother        ? age of dx  . Arthritis Father   . Kidney disease Father   . Kidney cancer Father   . Heart attack Brother   . Bladder Cancer Sister   . Hyperlipidemia Other   . Kidney cancer Paternal Aunt   . Anxiety disorder Neg Hx   . Bipolar disorder Neg Hx   . Depression Neg Hx   . Breast cancer Neg Hx     Social History:  Social History   Social History  . Marital status: Divorced    Spouse name: N/A  . Number of children: 1  . Years of education: N/A   Occupational History  . diabled    Social History Main Topics  . Smoking status: Current Every Day Smoker    Packs/day: 0.50    Years: 40.00    Types: Cigarettes  . Smokeless tobacco: Never  Used     Comment: tobacco info given 04/18/16  . Alcohol use 0.0 oz/week     Comment: occasionally- once every 2 weeks 1-2 beers  . Drug use: No  . Sexual activity: No   Other Topics Concern  . Not on file   Social History Narrative   Divorced, disabled   Lives with parents    Allergies:  Allergies  Allergen Reactions  . Penicillins Anaphylaxis  . Sulfonamide Derivatives Anaphylaxis    Metabolic Disorder Labs: Lab Results  Component Value Date   HGBA1C 6.9 (H) 10/20/2016   No results found for: PROLACTIN Lab Results  Component Value Date   CHOL 186 10/20/2016   TRIG 67.0 10/20/2016   HDL 88.10 10/20/2016   CHOLHDL 2 10/20/2016   VLDL 13.4 10/20/2016   LDLCALC 84 10/20/2016   LDLCALC 92 01/11/2016     Current Medications: Current Outpatient  Prescriptions  Medication Sig Dispense Refill  . ACCU-CHEK SOFTCLIX LANCETS lancets Use as instructed to check blood sugar 4 times per day dx code E10.65 400 each 1  . Alcohol Swabs (B-D SINGLE USE SWABS REGULAR) PADS Use to test blood sugar 4 times daily Dx code E10.65 200 each 2  . aspirin EC 81 MG tablet Take 81 mg by mouth every morning.    . B-D ULTRAFINE III SHORT PEN 31G X 8 MM MISC INJECT INSULIN ONCE DAILY 100 each 0  . Bioflavonoid Products (BIOFLEX PO) Take 1 tablet by mouth every morning. Reported on 11/22/2015    . Blood Glucose Monitoring Suppl (ACCU-CHEK AVIVA PLUS) w/Device KIT USE AS DIRECTED 1 kit 0  . Calcium Carb-Cholecalciferol (CALCIUM-VITAMIN D) 500-200 MG-UNIT tablet Take 1 tablet by mouth daily.    . clindamycin (CLINDAGEL) 1 % gel Reported on 11/22/2015  1  . diclofenac sodium (VOLTAREN) 1 % GEL APPLY 2 GRAMS EXTERNALLY TO THE AFFECTED AREA FOUR TIMES DAILY 100 g 0  . gabapentin (NEURONTIN) 100 MG capsule TAKE 1 TO 2 CAPSULES(100 TO 200 MG) BY MOUTH TWICE DAILY 120 capsule 1  . glucagon (GLUCAGON EMERGENCY) 1 MG injection INJECT 1 MG INTO THE SKIN AT ONCE AS NEEDED 2 kit 5  . insulin degludec (TRESIBA FLEXTOUCH) 100 UNIT/ML SOPN FlexTouch Pen Inject 0.15 mLs (15 Units total) into the skin daily. 15 pen 1  . insulin lispro (HUMALOG) 100 UNIT/ML injection Sliding scale 10 units in am and 8 units at night (Patient taking differently: Inject 1-3 Units into the skin 2 (two) times daily as needed for high blood sugar. Sliding scale 10 units in am and 8 units at night) 30 mL 1  . lamoTRIgine (LAMICTAL) 150 MG tablet Take 1 tablet (150 mg total) by mouth daily. 90 tablet 2  . lipase/protease/amylase (CREON) 36000 UNITS CPEP capsule Take 2 capsules (72,000 Units total) by mouth 3 (three) times daily before meals. 300 capsule 3  . loratadine (CLARITIN) 10 MG tablet Take 10 mg by mouth as needed for allergies.    Marland Kitchen lovastatin (MEVACOR) 40 MG tablet Take 1 tablet (40 mg total) by mouth  at bedtime. 90 tablet 2  . Magnesium 500 MG TABS Take 1 tablet by mouth as needed.    . metoCLOPramide (REGLAN) 5 MG tablet TAKE 1 TABLET(5 MG) BY MOUTH THREE TIMES DAILY BEFORE MEALS 270 tablet 1  . Multiple Vitamin (MULTIVITAMIN) tablet Take 1 tablet by mouth daily. Reported on 11/22/2015    . omeprazole (PRILOSEC) 40 MG capsule Take 1 capsule (40 mg total) by mouth daily.  90 capsule 1  . traMADol (ULTRAM) 50 MG tablet Take 1 tablet (50 mg total) by mouth every 12 (twelve) hours as needed. 60 tablet 2  . trazodone (DESYREL) 300 MG tablet TAKE 1 AND 1/2 TABLETS(450 MG) BY MOUTH AT BEDTIME 135 tablet 2  . vitamin A 10000 UNIT capsule Take 10,000 Units by mouth daily.    . vitamin E (VITAMIN E) 1000 UNIT capsule Take 1,000 Units by mouth daily.       No current facility-administered medications for this visit.     Musculoskeletal: Strength & Muscle Tone: within normal limits Gait & Station: normal Patient leans: N/A  Psychiatric Specialty Exam: Review of Systems  Musculoskeletal: Positive for back pain and joint pain. Negative for neck pain.  Neurological: Positive for sensory change and headaches. Negative for dizziness and tingling.  Psychiatric/Behavioral: Positive for depression and suicidal ideas. Negative for hallucinations and substance abuse. The patient does not have insomnia.     Blood pressure 120/70, pulse 87, height _0  (1.626 m), weight 109 lb 9.6 oz (49.7 kg).Body mass index is 18.81 kg/m.  General Appearance: Fairly Groomed  Eye Contact:  Good  Speech:  Clear and Coherent and Normal Rate  Volume:  Normal  Mood:  Depressed  Affect:  Congruent  Thought Process:  Goal Directed and Descriptions of Associations: Intact  Orientation:  Full (Time, Place, and Person)  Thought Content: Logical   Suicidal Thoughts:  Yes.  without intent/plan  Homicidal Thoughts:  No  Memory:  Immediate;   Good Recent;   Good Remote;   Good  Judgement:  Poor  Insight:  Shallow   Psychomotor Activity:  Normal  Concentration:  Concentration: Good and Attention Span: Good  Recall:  Good  Fund of Knowledge: Good  Language: Good  Akathisia:  No  Handed:  Right  AIMS (if indicated):  n/a  Assets:  Communication Skills Desire for Improvement Housing Social Support Talents/Skills Transportation  ADL's:  Intact  Cognition: WNL  Sleep:  good     Treatment Plan Summary:Medication management  Assessment: MDD-recurrent, severe without psychotic features; Social anxiety disorder; Insomnia; r/o Bipolar disorder   Medication management with supportive therapy. Risks/benefits and SE of the medication discussed. Pt verbalized understanding and verbal consent obtained for treatment.  Affirm with the patient that the medications are taken as ordered. Patient expressed understanding of how their medications were to be used.   Meds: increase Lamcital to 133m po BID for depression Trazodone [id number] qHS prn insomnia Pt declined treatment with Lithium and TCA's for augmentation or mood stabalization  Labs: none   Therapy: brief supportive therapy provided. Discussed psychosocial stressors in detail.   We discussed the need to change and make a daily routine by working or volunteering.  Consultations: none  Pt denies SI and is at an acute low risk for suicide. Patient told to call clinic if any problems occur. Patient advised to go to ER if they should develop SI/HI, side effects, or if symptoms worsen. Has crisis numbers to call if needed. Pt verbalized understanding.  F/up in 3 months or sooner if needed   SaCharlcie CradleMD 01/04/2017, 8:54 AM

## 2017-01-12 ENCOUNTER — Other Ambulatory Visit (INDEPENDENT_AMBULATORY_CARE_PROVIDER_SITE_OTHER): Payer: Medicare HMO

## 2017-01-12 ENCOUNTER — Telehealth: Payer: Self-pay

## 2017-01-12 DIAGNOSIS — E1065 Type 1 diabetes mellitus with hyperglycemia: Secondary | ICD-10-CM

## 2017-01-12 LAB — GLUCOSE, RANDOM: Glucose, Bld: 31 mg/dL — CL (ref 70–99)

## 2017-01-12 LAB — HEMOGLOBIN A1C: Hgb A1c MFr Bld: 6.5 % (ref 4.6–6.5)

## 2017-01-12 NOTE — Telephone Encounter (Signed)
Called patient and she stated that she took orange juice and she also ate lunch. She checked her blood sugar while we were on the phone and her reading was 92. She had a low glucose this morning of 31. She stated that she felt better and was doing well.

## 2017-01-17 ENCOUNTER — Ambulatory Visit (INDEPENDENT_AMBULATORY_CARE_PROVIDER_SITE_OTHER): Payer: Medicare HMO | Admitting: Endocrinology

## 2017-01-17 ENCOUNTER — Encounter: Payer: Self-pay | Admitting: Endocrinology

## 2017-01-17 VITALS — BP 112/64 | HR 71 | Ht 64.0 in | Wt 110.2 lb

## 2017-01-17 DIAGNOSIS — E1065 Type 1 diabetes mellitus with hyperglycemia: Secondary | ICD-10-CM | POA: Diagnosis not present

## 2017-01-17 DIAGNOSIS — R634 Abnormal weight loss: Secondary | ICD-10-CM

## 2017-01-17 NOTE — Progress Notes (Signed)
Patient ID: KARRIN EISENMENGER, female   DOB: May 23, 1956, 61 y.o.   MRN: [id number]   Reason for Appointment: Diabetes follow-up   History of Present Illness    Diagnosis: Type 1 DIABETES MELITUS, diagnosed 1967      She has had labile blood sugar control over the last several years even though A1c has been usually around 7% She has had less lability and hypoglycemia with taking b.i.d. Lantus compared once a day She has been very sensitive to fast acting insulin and frequently does not require mealtime coverage She cannot tolerate Toujeo because of reported episode of headache, bloating and nausea with the first dose  RECENT history:  Insulin regimen: Tresiba 8 units daily, Humalog 1-2 p.r.n.   Her A1c in April was 6.9, now 6.5  Current blood sugar patterns and problems identified:  Her Tyler Aas was reduced by 2 units on her last visit because of frequent hypoglycemia  She is still having tendency to hypoglycemia several times a week but usually in the afternoons and evenings  Her FASTING blood sugars have not been low and she has had relatively more stable readings although not clear why some of them are significantly high  She again said that she is getting hypoglycemia when she is much more active and she does not take any snacks before or during her activity to prevent hypoglycemia as discussed several times before  Also she is thinks she is sometimes taking Humalog when the blood sugar is mildly increased and she is planning to eat a larger meal  However today even with 1 unit of Humalog for a blood sugar of 178 her sugar is now 78 in the office after lunch when she had only about 30 grams of carbs  She said that she is afraid of her blood sugar going very high but her average blood sugar at home is only 120 with the highest 280  Also appears that occasionally a low sugar is preceded by a relatively high reading  She does not remember details and does not know why  she is having some episodes of low sugars  Also because of memory difficulty she does not remember to take snacks as directed significant hypoglycemia   She has been communicating the DexCom to get new sensor but the order is still in process  HYPOGLYCEMIA: As above, may not have symptoms with low blood sugars and does not have early warning symptoms. Gets confused frequently and she depends on her mother to recognize low sugars and treat them. Her mother knows how to give Glucagon injection She will treat her low blood sugars usually with juice, does not like glucose tablets; sometimes will eat food at the same time      DIET: She is trying to get protein with eggs in the morning Meal times: dinner 5-6 p.m.Marland Kitchen  She will eat more when she is anxious  Monitors blood glucose:  about 4-5 times a day.    Glucometer:   Accu-Chek Aviva  Blood Glucose readings from meter download as follows  Mean values apply above for all meters except median for One Touch  PRE-MEAL Fasting Lunch Dinner Bedtime Overall  Glucose range:  39-212  48-280   34-1 99   31-215  30-280   Mean/median: 129  158   102  100  120+/-58      Wt Readings from Last 3 Encounters:  01/17/17 110 lb 3.2 oz (50 kg)  12/06/16 116 lb 6.4 oz (52.8  kg)  11/22/16 114 lb (51.7 kg)    Lab Results  Component Value Date   HGBA1C 6.5 01/12/2017   HGBA1C 6.9 (H) 10/20/2016   HGBA1C 7.0 (H) 06/05/2016   Lab Results  Component Value Date   MICROALBUR 3.8 (H) 10/20/2016   LDLCALC 84 10/20/2016   CREATININE 0.83 10/20/2016        Allergies as of 01/17/2017      Reactions   Penicillins Anaphylaxis   Sulfonamide Derivatives Anaphylaxis      Medication List       Accurate as of 01/17/17  8:55 PM. Always use your most recent med list.          ACCU-CHEK AVIVA PLUS w/Device Kit USE AS DIRECTED   ACCU-CHEK SOFTCLIX LANCETS lancets Use as instructed to check blood sugar 4 times per day dx code E10.65   aspirin EC 81 MG  tablet Take 81 mg by mouth every morning.   B-D SINGLE USE SWABS REGULAR Pads Use to test blood sugar 4 times daily Dx code E10.65   B-D ULTRAFINE III SHORT PEN 31G X 8 MM Misc Generic drug:  Insulin Pen Needle INJECT INSULIN ONCE DAILY   diclofenac sodium 1 % Gel Commonly known as:  VOLTAREN APPLY 2 GRAMS EXTERNALLY TO THE AFFECTED AREA FOUR TIMES DAILY   gabapentin 100 MG capsule Commonly known as:  NEURONTIN TAKE 1 TO 2 CAPSULES(100 TO 200 MG) BY MOUTH TWICE DAILY   glucagon 1 MG injection Commonly known as:  GLUCAGON EMERGENCY INJECT 1 MG INTO THE SKIN AT ONCE AS NEEDED   insulin degludec 100 UNIT/ML Sopn FlexTouch Pen Commonly known as:  TRESIBA FLEXTOUCH Inject 0.15 mLs (15 Units total) into the skin daily.   insulin lispro 100 UNIT/ML injection Commonly known as:  HUMALOG Sliding scale 10 units in am and 8 units at night   lamoTRIgine 100 MG tablet Commonly known as:  LAMICTAL Take 1 tablet (100 mg total) by mouth 2 (two) times daily.   lipase/protease/amylase 36000 UNITS Cpep capsule Commonly known as:  CREON Take 2 capsules (72,000 Units total) by mouth 3 (three) times daily before meals.   loratadine 10 MG tablet Commonly known as:  CLARITIN Take 10 mg by mouth as needed for allergies.   lovastatin 40 MG tablet Commonly known as:  MEVACOR Take 1 tablet (40 mg total) by mouth at bedtime.   Magnesium 500 MG Tabs Take 1 tablet by mouth as needed.   metoCLOPramide 5 MG tablet Commonly known as:  REGLAN TAKE 1 TABLET(5 MG) BY MOUTH THREE TIMES DAILY BEFORE MEALS   multivitamin tablet Take 1 tablet by mouth daily. Reported on 11/22/2015   omeprazole 40 MG capsule Commonly known as:  PRILOSEC Take 1 capsule (40 mg total) by mouth daily.   traMADol 50 MG tablet Commonly known as:  ULTRAM Take 1 tablet (50 mg total) by mouth every 12 (twelve) hours as needed.   trazodone 300 MG tablet Commonly known as:  DESYREL TAKE 1 AND 1/2 TABLETS(450 MG) BY MOUTH  AT BEDTIME   vitamin A 10000 UNIT capsule Take 10,000 Units by mouth daily.   vitamin E 1000 UNIT capsule Generic drug:  vitamin E Take 1,000 Units by mouth daily.       Allergies:  Allergies  Allergen Reactions  . Penicillins Anaphylaxis  . Sulfonamide Derivatives Anaphylaxis    Past Medical History:  Diagnosis Date  . ALLERGIC RHINITIS   . Anemia   . Anxiety   .  Arthritis   . BIPOLAR AFFECTIVE DISORDER   . DEPRESSION   . Diabetes mellitus type I ([place])   . DIABETES MELLITUS, TYPE II    follows with endo  . Fibromyalgia   . GERD   . HEPATITIS B   . HYPERLIPIDEMIA   . LOW BACK PAIN, CHRONIC   . MIGRAINE HEADACHE   . NARCOTIC ABUSE    hx of  . NECK PAIN, CHRONIC   . Neuropathy   . SMOKER     Past Surgical History:  Procedure Laterality Date  . CESAREAN SECTION    . TUBAL LIGATION      Family History  Problem Relation Age of Onset  . Arthritis Mother   . Colon cancer Mother        ? age of dx  . Arthritis Father   . Kidney disease Father   . Kidney cancer Father   . Heart attack Brother   . Bladder Cancer Sister   . Hyperlipidemia Other   . Kidney cancer Paternal Aunt   . Anxiety disorder Neg Hx   . Bipolar disorder Neg Hx   . Depression Neg Hx   . Breast cancer Neg Hx     Social History:  reports that she has been smoking Cigarettes.  She has a 20.00 pack-year smoking history. She has never used smokeless tobacco. She reports that she drinks alcohol. She reports that she does not use drugs.  Review of Systems:   WEIGHT loss: She does not know why she is losing weight, she has not cut back on her meal intake and has not discussed with PCP  Wt Readings from Last 3 Encounters:  01/17/17 110 lb 3.2 oz (50 kg)  12/06/16 116 lb 6.4 oz (52.8 kg)  11/22/16 114 lb (51.7 kg)    Has probable gastroparesis treated with Reglan   The following is a copy of previous note  NEUROPATHY: She has burning in her legs and feet at times She is taking  gabapentin 100 mg, 1-2 capsules at a time but complaining of increased burning including nocturnal Also has burning during the night She was advised to take as many as 300 mg of gabapentin at bedtime   DEPRESSION: She has had long-standing depression and anxiety with variable level of control and is currently on Lamictal   HYPERLIPIDEMIA: The lipid abnormality consists of elevated LDL   Is  on lovastatin 40 mg with  good control   Lab Results  Component Value Date   CHOL 186 10/20/2016   HDL 88.10 10/20/2016   LDLCALC 84 10/20/2016   LDLDIRECT 101.7 03/26/2012   TRIG 67.0 10/20/2016   CHOLHDL 2 10/20/2016     Has history of small goiter but no History of hypothyroidism  Lab Results  Component Value Date   TSH 1.38 10/20/2016       Examination:   BP 112/64   Pulse 71   Ht 5' 4"  (1.626 m)   Wt 110 lb 3.2 oz (50 kg)   SpO2 96%   BMI 18.92 kg/m   Body mass index is 18.92 kg/m.    ASSESSMENT/ PLAN:   Diabetes type 1 See history of present illness for detailed discussion of current insulin, blood sugar patterns, problems identified  Her A1c is 6.5  She appears to be requiring much less insulin overall and taking only 8 units of Tresiba as her significant insulin regimen Has not had much hyperglycemia after meals and only some blood sugars above her and  80, mostly late morning and midday Also she is not taking the precautions to prevent hypoglycemia with physical activity as discussed in detail above Fasting readings are variable but on an average fairly good without recent hypoglycemia in the morning or overnight  She has severe hypoglycemia unawareness    Recommended that she start taking snacks before planning any exercise at his house cleaning and activities that she knows will make her sugar.  Low She should also check her sugars in between at least every 40-60 minutes to make sure she does not need another snack She will continue to carry glucose  tablets Her mother knows how to give Glucagon No change in [place] Advisor to take Humalog only if blood sugars are over 250 or with weight loss carbohydrate intake except when planning to be active Discussed what carbohydrates are She will call to see her DexCom order has been approved by insurance  Patient Instructions  Juice before exercise/housework No Humalog unless sugar > 250 and not being active  See Dr Sharlet Salina for weight loss  Counseling time on subjects discussed in assessment and plan sections is over 50% of today's 25 minute visit   Shuronda Santino 01/17/2017, 8:55 PM

## 2017-01-17 NOTE — Patient Instructions (Addendum)
Juice before exercise/housework No Humalog unless sugar > 250 and not being active  See Dr Sharlet Salina for weight loss

## 2017-01-18 ENCOUNTER — Telehealth: Payer: Self-pay | Admitting: Internal Medicine

## 2017-01-18 ENCOUNTER — Other Ambulatory Visit: Payer: Self-pay | Admitting: Internal Medicine

## 2017-01-18 NOTE — Telephone Encounter (Signed)
Pt called regarding this medication, she was told it was refused because she requested it too soon. She got upset and said she cannot go without it, she has about 12 left but she takes 6 a day, she states Dr. Sharlet Salina told her she can take 6x per day. She takes 2 at breakfast  1 at lunch and 1 at dinner and 2 at night.  She is going to call the pharmacy to see how much it would be out of pocket.

## 2017-01-19 NOTE — Telephone Encounter (Signed)
I can not fill this medication without there being documentation stating she can take 6 a day, the Rx states to take 1-2 caps two times a day which is at most 4 caps a day, without there being documentation stating she can take more I cant refill without her coming in for a visit and the medication being reviewed.  I looked in notes from recent visits and did not see anything saying she can take more than prescribed. She can come in for visit if she is willing to see if we can up the Rx to what she is stating she taking

## 2017-01-20 ENCOUNTER — Other Ambulatory Visit: Payer: Self-pay | Admitting: Internal Medicine

## 2017-01-23 ENCOUNTER — Other Ambulatory Visit: Payer: Self-pay | Admitting: Internal Medicine

## 2017-02-19 ENCOUNTER — Other Ambulatory Visit: Payer: Self-pay | Admitting: Endocrinology

## 2017-02-20 ENCOUNTER — Other Ambulatory Visit: Payer: Self-pay | Admitting: Family

## 2017-03-06 ENCOUNTER — Telehealth: Payer: Self-pay | Admitting: Endocrinology

## 2017-03-06 NOTE — Telephone Encounter (Signed)
Patient called to see if she can get a rx for the new meter that goes on your arm (patient does not know the name). Call patient to further advise.

## 2017-03-07 ENCOUNTER — Telehealth: Payer: Self-pay | Admitting: Nutrition

## 2017-03-07 NOTE — Telephone Encounter (Signed)
Routing to you °

## 2017-03-07 NOTE — Telephone Encounter (Signed)
Please advise if okay to send in for the [facility] for patient.

## 2017-03-07 NOTE — Telephone Encounter (Signed)
She is supposed to get the [facility] sensor, please have the DexCom rep facilitate this since it has been already requested, Vaughan Basta please help

## 2017-03-07 NOTE — Telephone Encounter (Signed)
I phoned patient, and she said she has heard nothing from [facility] about her sensors.  She was given their number to call to see why they have not contacted her

## 2017-03-08 NOTE — Telephone Encounter (Signed)
Called patient and she stated that she called Dex[employer] and she said they are waiting for [employer] to prepare for her and she is worried that they will take too long. She also stated that her blood sugars are all over the place being up and down every day.

## 2017-03-12 ENCOUNTER — Other Ambulatory Visit: Payer: Self-pay

## 2017-03-12 ENCOUNTER — Telehealth: Payer: Self-pay | Admitting: Endocrinology

## 2017-03-12 MED ORDER — INSULIN LISPRO 100 UNIT/ML ~~LOC~~ SOLN: 1.0000 [IU] | Freq: Two times a day (BID) | SUBCUTANEOUS | 1 refills | Status: DC | PRN

## 2017-03-12 NOTE — Telephone Encounter (Signed)
MEDICATION: insulin lispro (HUMALOG) 100 UNIT/ML injection  PHARMACY:    [facility] - [place], [place] [phone] (Phone) [phone] (Fax)    IS THIS A 90 DAY SUPPLY : yes  IS PATIENT OUT OF MEDICATION: yes  IF NOT; HOW MUCH IS LEFT: n/a  LAST APPOINTMENT DATE: 03/06/2017  NEXT APPOINTMENT DATE:03/20/17  OTHER COMMENTS:    **Let patient know to contact pharmacy at the end of the day to make sure medication is ready. **  ** Please notify patient to allow 48-72 hours to process**  **Encourage patient to contact the pharmacy for refills or they can request refills through [facility]**

## 2017-03-12 NOTE — Telephone Encounter (Signed)
Called patient and let her know that I have sent this prescription over for her.

## 2017-03-13 ENCOUNTER — Other Ambulatory Visit: Payer: Self-pay

## 2017-03-13 MED ORDER — INSULIN LISPRO 100 UNIT/ML ~~LOC~~ SOLN: 1.0000 [IU] | Freq: Two times a day (BID) | SUBCUTANEOUS | 1 refills | Status: DC | PRN

## 2017-03-13 NOTE — Telephone Encounter (Signed)
Patient called to ask if the medication was resent to the pharmacy. I advised that it was faxed yesterday however, there was not an update in the system if it was resent again today yet.

## 2017-03-13 NOTE — Telephone Encounter (Signed)
Patient called in stating [facility] did not receive Rx for Humalog. Please call pharmacy and advise.

## 2017-03-14 ENCOUNTER — Other Ambulatory Visit: Payer: Self-pay | Admitting: Endocrinology

## 2017-03-14 ENCOUNTER — Other Ambulatory Visit: Payer: Self-pay | Admitting: Internal Medicine

## 2017-03-14 MED ORDER — INSULIN LISPRO 100 UNIT/ML ~~LOC~~ SOLN: SUBCUTANEOUS | 2 refills | Status: DC

## 2017-03-14 NOTE — Telephone Encounter (Signed)
Spoke to pt, told her I called [facility] and they did receive Rx for Humalog and they are working on it. Told pt to call pharmacy before going to make sure ready. Pt verbalized understanding.

## 2017-03-15 ENCOUNTER — Other Ambulatory Visit: Payer: Self-pay | Admitting: Endocrinology

## 2017-03-15 ENCOUNTER — Other Ambulatory Visit: Payer: Self-pay

## 2017-03-15 MED ORDER — INSULIN ASPART 100 UNIT/ML ~~LOC~~ SOLN: SUBCUTANEOUS | 11 refills | Status: DC

## 2017-03-15 NOTE — Telephone Encounter (Signed)
Faxed to [facility] on [email or web address]

## 2017-03-15 NOTE — Telephone Encounter (Signed)
Humalog is not covered under the patients formulary. [facility] called in per Dr. Ronnie Derby request.

## 2017-03-16 ENCOUNTER — Telehealth: Payer: Self-pay

## 2017-03-16 NOTE — Telephone Encounter (Signed)
PA for Humalog was initiated 03/15/2017 waiting for Approval through [email or web address]

## 2017-03-19 ENCOUNTER — Other Ambulatory Visit: Payer: Self-pay | Admitting: Internal Medicine

## 2017-03-19 ENCOUNTER — Telehealth: Payer: Self-pay

## 2017-03-19 NOTE — Telephone Encounter (Signed)
PA received for Humalog, submitted on 03/16/17. Denial received 03/19/17.

## 2017-03-20 ENCOUNTER — Ambulatory Visit (INDEPENDENT_AMBULATORY_CARE_PROVIDER_SITE_OTHER): Payer: Medicare HMO | Admitting: Endocrinology

## 2017-03-20 ENCOUNTER — Telehealth: Payer: Self-pay | Admitting: Gastroenterology

## 2017-03-20 ENCOUNTER — Encounter: Payer: Self-pay | Admitting: Endocrinology

## 2017-03-20 VITALS — BP 132/76 | HR 86 | Ht 64.0 in | Wt 102.8 lb

## 2017-03-20 DIAGNOSIS — K529 Noninfective gastroenteritis and colitis, unspecified: Secondary | ICD-10-CM | POA: Diagnosis not present

## 2017-03-20 DIAGNOSIS — E1065 Type 1 diabetes mellitus with hyperglycemia: Secondary | ICD-10-CM

## 2017-03-20 DIAGNOSIS — D508 Other iron deficiency anemias: Secondary | ICD-10-CM

## 2017-03-20 DIAGNOSIS — R634 Abnormal weight loss: Secondary | ICD-10-CM | POA: Diagnosis not present

## 2017-03-20 LAB — CBC WITH DIFFERENTIAL/PLATELET
BASOS ABS: 0.1 10*3/uL (ref 0.0–0.1)
BASOS PCT: 2.7 % (ref 0.0–3.0)
EOS ABS: 0.1 10*3/uL (ref 0.0–0.7)
Eosinophils Relative: 4.5 % (ref 0.0–5.0)
HEMATOCRIT: 41.5 % (ref 36.0–46.0)
HEMOGLOBIN: 13.8 g/dL (ref 12.0–15.0)
LYMPHS PCT: 53.6 % — AB (ref 12.0–46.0)
Lymphs Abs: 1.8 10*3/uL (ref 0.7–4.0)
MCHC: 33.3 g/dL (ref 30.0–36.0)
MCV: 93.7 fl (ref 78.0–100.0)
Monocytes Absolute: 0.4 10*3/uL (ref 0.1–1.0)
Monocytes Relative: 10.7 % (ref 3.0–12.0)
Neutro Abs: 0.9 10*3/uL — ABNORMAL LOW (ref 1.4–7.7)
Neutrophils Relative %: 28.5 % — ABNORMAL LOW (ref 43.0–77.0)
Platelets: 213 10*3/uL (ref 150.0–400.0)
RBC: 4.43 Mil/uL (ref 3.87–5.11)
RDW: 14.6 % (ref 11.5–15.5)
WBC: 3.3 10*3/uL — AB (ref 4.0–10.5)

## 2017-03-20 LAB — BASIC METABOLIC PANEL
BUN: 13 mg/dL (ref 6–23)
CALCIUM: 9.8 mg/dL (ref 8.4–10.5)
CO2: 35 meq/L — AB (ref 19–32)
Chloride: 99 mEq/L (ref 96–112)
Creatinine, Ser: 0.74 mg/dL (ref 0.40–1.20)
GFR: 84.27 mL/min (ref >60.00)
Glucose, Bld: 138 mg/dL — ABNORMAL HIGH (ref 70–99)
Potassium: 4.3 mEq/L (ref 3.5–5.1)
SODIUM: 136 meq/L (ref 135–145)

## 2017-03-20 LAB — IBC PANEL
IRON: 118 ug/dL (ref 42–145)
SATURATION RATIOS: 35.1 % (ref 20.0–50.0)
TRANSFERRIN: 240 mg/dL (ref 212.0–360.0)

## 2017-03-20 LAB — TSH: TSH: 0.84 u[IU]/mL (ref 0.35–4.50)

## 2017-03-20 NOTE — Progress Notes (Signed)
Patient ID: Leslie Lane, female   DOB: [birth year], 61 y.o.   MRN: [id number]   Reason for Appointment: Diabetes follow-up   History of Present Illness    Diagnosis: Type 1 DIABETES MELITUS, diagnosed 1967      She has had labile blood sugar control over the last several years even though A1c has been usually around 7% She has had less lability and hypoglycemia with taking b.i.d. Lantus compared once a day She has been very sensitive to fast acting insulin and frequently does not require mealtime coverage She cannot tolerate Toujeo because of reported episode of headache, bloating and nausea with the first dose  RECENT history:  Insulin regimen: Tresiba 8 units daily, Humalog 1-2 p.r.n.    Her last A1c was 6.5  In July  Current blood sugar patterns and problems identified:  She was supposed to start the DexCom sensor but she has not pursued this and not clear why she has not gained this even though she has been contacted by them wants to try the freestyle Libre sensor  Her Tyler Aas was changed on her last visit  Even though she said that she is trying to eat 3 meals she is having significantly low sugars fasting and some late at night also  She has been waking up with blood sugars around 48 times and lowest blood glucose is about 38  Also because of memory difficulty she does not remember to take snacks as directed to prevent hypoglycemia especially when she is active  Usually her blood sugars are not as low during the day recently  Only rarely will have blood sugars over 200 and she took one unit of Humalog for this at breakfast and postprandial reading after breakfast is 164 today  At bedtime she will eat peanut butter crackers usually  Her Humalog is not covered today and she has not picked up her prescription for NovoLog that was tried at the pharmacy  She has been communicating the DexCom to get new sensor but the order is still in process  HYPOGLYCEMIA:  As above, may not have symptoms with low blood sugars and does not have early warning symptoms. Gets confused frequently and she depends on her mother to recognize low sugars and treat them. Her mother knows how to give Glucagon injection She will treat her low blood sugars usually with juice, does not like glucose tablets; sometimes will eat food at the same time      DIET: She is trying to get protein with eggs in the morning Meal times: dinner 5-6 p.m.Marland Kitchen  She will eat more when she is anxious  Monitors blood glucose:  about 4-5 times a day.    Glucometer:   Accu-Chek Aviva  Blood Glucose readings from meter download as follows  Mean values apply above for all meters except median for One Touch  PRE-MEAL Fasting Lunch Dinner Bedtime Overall  Glucose range:       Mean/median:     141   POST-MEAL PC Breakfast PC Lunch PC Dinner  Glucose range:     Mean/median:        Wt Readings from Last 3 Encounters:  03/20/17 102 lb 12.8 oz (46.6 kg)  01/17/17 110 lb 3.2 oz (50 kg)  12/06/16 116 lb 6.4 oz (52.8 kg)    Lab Results  Component Value Date   HGBA1C 6.5 01/12/2017   HGBA1C 6.9 (H) 10/20/2016   HGBA1C 7.0 (H) 06/05/2016   Lab Results  Component  Value Date   MICROALBUR 3.8 (H) 10/20/2016   LDLCALC 84 10/20/2016   CREATININE 0.83 10/20/2016        Allergies as of 03/20/2017      Reactions   Penicillins Anaphylaxis   Sulfonamide Derivatives Anaphylaxis      Medication List       Accurate as of 03/20/17  3:31 PM. Always use your most recent med list.          ACCU-CHEK AVIVA PLUS test strip Generic drug:  glucose blood USE AS DIRECTED FOUR TIMES DAILY   ACCU-CHEK AVIVA PLUS w/Device Kit USE AS DIRECTED   ACCU-CHEK SOFTCLIX LANCETS lancets Use as instructed to check blood sugar 4 times per day dx code E10.65   aspirin EC 81 MG tablet Take 81 mg by mouth every morning.   B-D SINGLE USE SWABS REGULAR Pads Use to test blood sugar 4 times daily Dx code  E10.65   B-D ULTRAFINE III SHORT PEN 31G X 8 MM Misc Generic drug:  Insulin Pen Needle USE TO INJECT INSULIN DAILY.   diclofenac sodium 1 % Gel Commonly known as:  VOLTAREN APPLY 2 GRAMS EXTERNALLY TO THE AFFECTED AREA FOUR TIMES DAILY   gabapentin 100 MG capsule Commonly known as:  NEURONTIN TAKE 1 TO 2 CAPSULES(100 TO 200 MG) BY MOUTH TWICE DAILY   glucagon 1 MG injection Commonly known as:  GLUCAGON EMERGENCY INJECT 1 MG INTO THE SKIN AT ONCE AS NEEDED   insulin aspart 100 UNIT/ML injection Commonly known as:  NOVOLOG Sliding Scale 10 units in the am and 8 in the pm. 1-3 units as needed for high blood sugars.   insulin degludec 100 UNIT/ML Sopn FlexTouch Pen Commonly known as:  TRESIBA FLEXTOUCH Inject 0.15 mLs (15 Units total) into the skin daily.   insulin lispro 100 UNIT/ML injection Commonly known as:  HUMALOG Inject 0.01-0.03 mLs (1-3 Units total) into the skin 2 (two) times daily as needed for high blood sugar. Sliding scale 10 units in am and 8 units at night   lamoTRIgine 100 MG tablet Commonly known as:  LAMICTAL Take 1 tablet (100 mg total) by mouth 2 (two) times daily.   lipase/protease/amylase 36000 UNITS Cpep capsule Commonly known as:  CREON Take 2 capsules (72,000 Units total) by mouth 3 (three) times daily before meals.   loratadine 10 MG tablet Commonly known as:  CLARITIN Take 10 mg by mouth as needed for allergies.   lovastatin 40 MG tablet Commonly known as:  MEVACOR Take 1 tablet (40 mg total) by mouth at bedtime.   Magnesium 500 MG Tabs Take 1 tablet by mouth as needed.   metoCLOPramide 5 MG tablet Commonly known as:  REGLAN TAKE 1 TABLET(5 MG) BY MOUTH THREE TIMES DAILY BEFORE MEALS   multivitamin tablet Take 1 tablet by mouth daily. Reported on 11/22/2015   omeprazole 40 MG capsule Commonly known as:  PRILOSEC Take 1 capsule (40 mg total) by mouth 2 (two) times daily. Annual appt due in Nov must see provider for future refills    traMADol 50 MG tablet Commonly known as:  ULTRAM TAKE 1 TABLET BY MOUTH EVERY 12 HOURS AS NEEDED FOR MODERATE TO SEVERE PAIN.   trazodone 300 MG tablet Commonly known as:  DESYREL TAKE 1 AND 1/2 TABLETS(450 MG) BY MOUTH AT BEDTIME   vitamin A 10000 UNIT capsule Take 10,000 Units by mouth daily.   vitamin E 1000 UNIT capsule Generic drug:  vitamin E Take 1,000 Units by mouth  daily.            Discharge Care Instructions        Start     Ordered   03/20/17 0000  CBC with Differential/Platelet     03/20/17 1056   03/20/17 0000  IBC panel     03/20/17 1056   03/20/17 0000  Ambulatory referral to Gastroenterology    Question:  What is the reason for referral?  Answer:  Other  Comment:  Marked weight loss and diarrhea   03/20/17 1100   01/17/17 9323  Basic metabolic panel     55/73/22 2058   01/17/17 0000  TSH     01/17/17 2058      Allergies:  Allergies  Allergen Reactions  . Penicillins Anaphylaxis  . Sulfonamide Derivatives Anaphylaxis    Past Medical History:  Diagnosis Date  . ALLERGIC RHINITIS   . Anemia   . Anxiety   . Arthritis   . BIPOLAR AFFECTIVE DISORDER   . DEPRESSION   . Diabetes mellitus type I ([place])   . DIABETES MELLITUS, TYPE II    follows with endo  . Fibromyalgia   . GERD   . HEPATITIS B   . HYPERLIPIDEMIA   . LOW BACK PAIN, CHRONIC   . MIGRAINE HEADACHE   . NARCOTIC ABUSE    hx of  . NECK PAIN, CHRONIC   . Neuropathy   . SMOKER     Past Surgical History:  Procedure Laterality Date  . CESAREAN SECTION    . TUBAL LIGATION      Family History  Problem Relation Age of Onset  . Arthritis Mother   . Colon cancer Mother        ? age of dx  . Arthritis Father   . Kidney disease Father   . Kidney cancer Father   . Heart attack Brother   . Bladder Cancer Sister   . Hyperlipidemia Other   . Kidney cancer Paternal Aunt   . Anxiety disorder Neg Hx   . Bipolar disorder Neg Hx   . Depression Neg Hx   . Breast cancer Neg Hx      Social History:  reports that she has been smoking Cigarettes.  She has a 20.00 pack-year smoking history. She has never used smokeless tobacco. She reports that she drinks alcohol. She reports that she does not use drugs.  Review of Systems:   WEIGHT loss: This continues and has lost significant amount of weight, she was 126 pounds last November She now says that she is eating only part of her meals Does not have any unusual symptoms of hyperthyroidism with heat intolerance or shakiness but she does have diarrhea She said that she is having diarrhea for the last year and this has not been evaluated  Wt Readings from Last 3 Encounters:  03/20/17 102 lb 12.8 oz (46.6 kg)  01/17/17 110 lb 3.2 oz (50 kg)  12/06/16 116 lb 6.4 oz (52.8 kg)    Lab Results  Component Value Date   TSH 1.38 10/20/2016    Has probable gastroparesis treated with Reglan    NEUROPATHY: She has burning in her legs and feet at times She was advised to take as many as 300 mg of gabapentin at bedtime   DEPRESSION: She has had long-standing depression and anxiety with variable level of control and is currently on Lamictal   HYPERLIPIDEMIA: The lipid abnormality consists of elevated LDL   Is  on lovastatin 40 mg with  good control   Lab Results  Component Value Date   CHOL 186 10/20/2016   HDL 88.10 10/20/2016   LDLCALC 84 10/20/2016   LDLDIRECT 101.7 03/26/2012   TRIG 67.0 10/20/2016   CHOLHDL 2 10/20/2016     Has history of small goiter   Lab Results  Component Value Date   TSH 1.38 10/20/2016       Examination:   BP 132/76   Pulse 86   Ht _0  (1.626 m)   Wt 102 lb 12.8 oz (46.6 kg)   SpO2 96%   BMI 17.65 kg/m   Body mass index is 17.65 kg/m.    ASSESSMENT/ PLAN:   Diabetes type 1 See history of present illness for detailed discussion of current insulin, blood sugar patterns, problems identified  Her Main problem his tendency to hypoglycemia in been with minimal doses of  8 units Tresiba once a day Most likely her hypoglycemia tendency is because of weight loss and decreased overall intake as well as diarrhea possibly associated with malabsorption As usual she is not requiring mealtime insulin coverage and rarely will take 1 unit of insulin to cover blood sugar when she is over 200, with eating a meal   She has severe hypoglycemia unawareness  She does need to have a continuous glucose monitor and preferably one that will alert her before her sugar gets low as she is not always coming to check her sugar especially when she is active She will call to see if her DexCom order has been approved by insurance and start using it as soon as available  [place] dose: She can go down to 6 units since she is having low blood sugars waking up almost daily recently and if fasting readings are over 150 consistently she can go to 7 units  WEIGHT loss and diarrhea: Will try to get her a GI consultation relatively urgently Check thyroid levels today and renal function also  History of iron deficiency: Will get follow-up labs and advised her to follow-up with PCP  Patient Instructions  Tyler Aas 6-7 units daily    Counseling time on subjects discussed in assessment and plan sections is over 50% of today's 25 minute visit   Manley Fason 03/20/2017, 3:31 PM

## 2017-03-20 NOTE — Telephone Encounter (Signed)
Patient can see APP or MD.  APP appt can be anytime ASAP

## 2017-03-20 NOTE — Telephone Encounter (Signed)
Patient has been scheduled for office visit 03/22/17 at 2pm with Nevin Bloodgood.

## 2017-03-20 NOTE — Patient Instructions (Addendum)
Tresiba 6-7 units daily

## 2017-03-21 ENCOUNTER — Other Ambulatory Visit: Payer: Self-pay

## 2017-03-21 NOTE — Progress Notes (Signed)
Please call to let patient know that she does not have low iron but has a low white blood cell count, needs to follow-up with her PCP Also have referred her to gastroenterologist for her diarrhea and she will get a call to set up an appointment

## 2017-03-22 ENCOUNTER — Encounter: Payer: Self-pay | Admitting: Nurse Practitioner

## 2017-03-22 ENCOUNTER — Other Ambulatory Visit: Payer: Medicare HMO

## 2017-03-22 ENCOUNTER — Ambulatory Visit (INDEPENDENT_AMBULATORY_CARE_PROVIDER_SITE_OTHER): Payer: Medicare HMO | Admitting: Nurse Practitioner

## 2017-03-22 VITALS — BP 124/60 | HR 76 | Ht 64.0 in | Wt 103.0 lb

## 2017-03-22 DIAGNOSIS — D508 Other iron deficiency anemias: Secondary | ICD-10-CM | POA: Diagnosis not present

## 2017-03-22 DIAGNOSIS — R197 Diarrhea, unspecified: Secondary | ICD-10-CM

## 2017-03-22 DIAGNOSIS — R634 Abnormal weight loss: Secondary | ICD-10-CM

## 2017-03-22 NOTE — Progress Notes (Signed)
     HPI: Patient is a 61 year old female known to Dr. Fuller Plan . She had a normal. screening colonoscopy in 2010 by [facility], due for repeat colonoscopy in 2020. We saw her in 2010 and 2017 for evaluation of abdominal pain. Leslie Lane has type I diabetes, depression, bipolar affective disorder, and fibromyalgia. She is referred by Dr. Dwyane Dee for evaluation of non-bloody diarrhea, present for a year and associated with a documented 30 pound weight loss. In late May PCP started her on Creon and diarrhea has since improved significantly. She never was able to correlate diarrhea with any certain foods including gluten. Now has only 2-3 loose BMs in the morning after breakfast, occasionally additional BMs in pm but not usually.  She still has chronic lower abdominal pain, same pain for years but it does get better with defecation.  No fevers. No nausea, vomiting or dysphagia.    Past Medical History:  Diagnosis Date  . ALLERGIC RHINITIS   . Anemia   . Anxiety   . Arthritis   . BIPOLAR AFFECTIVE DISORDER   . DEPRESSION   . Diabetes mellitus type I ([place])   . DIABETES MELLITUS, TYPE II    follows with endo  . Fibromyalgia   . GERD   . HEPATITIS B   . HYPERLIPIDEMIA   . LOW BACK PAIN, CHRONIC   . MIGRAINE HEADACHE   . NARCOTIC ABUSE    hx of  . NECK PAIN, CHRONIC   . Neuropathy   . SMOKER     Patient's surgical history, family medical history, social history, medications and allergies were all reviewed in Epic    Physical Exam: BP 124/60   Pulse 76   Ht 5\' 4"  (1.626 m)   Wt 103 lb (46.7 kg)   BMI 17.68 kg/m   GENERAL:  Thin white female in NAD PSYCH: :Pleasant, cooperative, normal affect EENT:  conjunctiva pink, mucous membranes moist, neck supple without masses CARDIAC:  RRR, no murmur heard, no peripheral edema PULM: Normal respiratory effort, lungs CTA bilaterally, no wheezing ABDOMEN:  Nondistended, soft, nontender. No obvious masses, no hepatomegaly,  normal bowel sounds SKIN:   turgor, no lesions seen Musculoskeletal:  Normal muscle tone, normal strength NEURO: Alert and oriented x 3, no focal neurologic deficits    ASSESSMENT and PLAN:  1. Pleasant 61 year old female with a one year history of diarrhea associated with involuntary weight loss of 30 pounds Diarrhea still present but significantly improved with pancreatic enzymes. rule out pancreatic exocrine insuffiency. Rule celiac, especially given iron deficiency anemia, hx of of type 1 diabetes and [place] of celiac in sister. TSH is normal.  -tTg, IgA -fecal elastase. Asked her to hold pancreatic enzymes at least 3 days prior to the test.  -Will call her with results and further recommendations.   2. Hx of mild iron deficiency anemia, low ferritin in November. Stopped iron after a week because of constipation. No longer anemia, Hgb is now normal at 13.8. No overt GI blood loss, suspect malabsorption is contributing factor.  -further recommendations to follow celiac testing results.   I spent 25 minutes of face-to-face time with the patient. Greater than 50% of the time was spent counseling and coordinating care. Questions answered   [name] , NP 03/22/2017, 2:27 PM   Cc: [name], MD

## 2017-03-22 NOTE — Patient Instructions (Signed)
If you are age 63 or older, your body mass index should be between 23-30. Your Body mass index is 17.68 kg/m. If this is out of the aforementioned range listed, please consider follow up with your Primary Care Provider.  If you are age 40 or younger, your body mass index should be between 19-25. Your Body mass index is 17.68 kg/m. If this is out of the aformentioned range listed, please consider follow up with your Primary Care Provider.   Your physician has requested that you go to the basement for the following lab work before leaving today: Fecal Elastase TTG, IgA  Will call with lab results.  Thank you for choosing me and [facility].   [name], NP

## 2017-03-26 ENCOUNTER — Encounter: Payer: Self-pay | Admitting: Internal Medicine

## 2017-03-26 ENCOUNTER — Ambulatory Visit (INDEPENDENT_AMBULATORY_CARE_PROVIDER_SITE_OTHER): Payer: Medicare HMO | Admitting: Internal Medicine

## 2017-03-26 ENCOUNTER — Other Ambulatory Visit: Payer: Medicare HMO

## 2017-03-26 VITALS — BP 130/60 | HR 67 | Temp 98.2°F | Ht 64.0 in | Wt 102.0 lb

## 2017-03-26 DIAGNOSIS — Z23 Encounter for immunization: Secondary | ICD-10-CM | POA: Diagnosis not present

## 2017-03-26 DIAGNOSIS — R5383 Other fatigue: Secondary | ICD-10-CM | POA: Diagnosis not present

## 2017-03-26 DIAGNOSIS — R634 Abnormal weight loss: Secondary | ICD-10-CM

## 2017-03-26 DIAGNOSIS — D72819 Decreased white blood cell count, unspecified: Secondary | ICD-10-CM | POA: Diagnosis not present

## 2017-03-26 DIAGNOSIS — R197 Diarrhea, unspecified: Secondary | ICD-10-CM | POA: Diagnosis not present

## 2017-03-26 NOTE — Patient Instructions (Signed)
We will get the scan done to look for a cause of the weight loss.   We can recheck the blood counts in about 1 month to see if the white count is normal.

## 2017-03-27 DIAGNOSIS — D72819 Decreased white blood cell count, unspecified: Secondary | ICD-10-CM | POA: Insufficient documentation

## 2017-03-27 DIAGNOSIS — R5383 Other fatigue: Secondary | ICD-10-CM | POA: Insufficient documentation

## 2017-03-27 DIAGNOSIS — R634 Abnormal weight loss: Secondary | ICD-10-CM | POA: Insufficient documentation

## 2017-03-27 HISTORY — DX: Decreased white blood cell count, unspecified: D72.819

## 2017-03-27 NOTE — Progress Notes (Signed)
   Subjective:    Patient ID: Leslie Lane, female    DOB: September 27, 1955, 61 y.o.   MRN: [id number]  HPI The patient is a 61 YO female coming in for follow up of her weight loss (has continued for a total of 40 pound unintentional weight loss, colonoscopy up to date, smoker and still smoking, eating well without gain, no localizing symptoms), and her fatigue (worse with the weight loss, wakes up refreshed but during the day anything will make her tired, no energy to do anything) and new problem of low white blood count (checked with endocrinology and WBC 3.3, previously not low, denies recent cold or illness). Concerned about her health and would like to pursue why her weight is declining.   Review of Systems  Constitutional: Positive for activity change, appetite change, fatigue and unexpected weight change.  HENT: Negative.   Eyes: Negative.   Respiratory: Positive for cough. Negative for chest tightness and shortness of breath.        Stable, intermittent  Cardiovascular: Negative for chest pain, palpitations and leg swelling.  Gastrointestinal: Positive for diarrhea. Negative for abdominal distention, abdominal pain, constipation, nausea and vomiting.       Off creon and diarrhea is recurrent  Musculoskeletal: Positive for arthralgias.  Skin: Negative.   Neurological: Positive for weakness. Negative for dizziness, seizures, syncope, light-headedness, numbness and headaches.  Psychiatric/Behavioral: Negative.       Objective:   Physical Exam  Constitutional: She is oriented to person, place, and time. She appears well-developed.  Gaunt, temporal wasting  HENT:  Head: Normocephalic and atraumatic.  Eyes: EOM are normal.  Neck: Normal range of motion.  Cardiovascular: Normal rate and regular rhythm.   Pulmonary/Chest: Effort normal and breath sounds normal. No respiratory distress. She has no wheezes. She has no rales.  Abdominal: Soft. Bowel sounds are normal. She exhibits no  distension. There is no tenderness. There is no rebound.  Musculoskeletal: She exhibits no edema.  Neurological: She is alert and oriented to person, place, and time. Coordination normal.  Skin: Skin is warm and dry.  Psychiatric: She has a normal mood and affect.   Vitals:   03/26/17 1457  BP: 130/60  Pulse: 67  Temp: 98.2 F (36.8 C)  TempSrc: Oral  SpO2: 99%  Weight: 102 lb (46.3 kg)  Height: 5\' 4"  (1.626 m)      Assessment & Plan:  Flu shot given at visit

## 2017-03-27 NOTE — Assessment & Plan Note (Signed)
Checking CT chest and abdomen/pelvis for evaluation. She is a smoker. Colonoscopy and mammogram up to date. Weight loss is significant and about 40 pounds in the last year.

## 2017-03-27 NOTE — Telephone Encounter (Signed)
This has been resolved patient switched to Novolog on 03/15/17

## 2017-03-27 NOTE — Assessment & Plan Note (Signed)
Likely due to dramatic loss of weight and muscle with deconditioning and lack of reserves.

## 2017-03-27 NOTE — Assessment & Plan Note (Signed)
Suspect benign and will recheck in about 1 month. If persistently declined and no etiology for weight loss on CT scan refer to hematology.

## 2017-03-28 NOTE — Progress Notes (Signed)
Reviewed and agree with initial management plan.  Krysteena Stalker T. Janiyha Montufar, MD FACG 

## 2017-03-30 ENCOUNTER — Other Ambulatory Visit: Payer: Medicare HMO

## 2017-03-30 ENCOUNTER — Telehealth: Payer: Self-pay | Admitting: Nurse Practitioner

## 2017-03-30 NOTE — Telephone Encounter (Signed)
Patient advised of the IgA results

## 2017-04-01 ENCOUNTER — Other Ambulatory Visit (HOSPITAL_COMMUNITY): Payer: Self-pay | Admitting: Psychiatry

## 2017-04-01 DIAGNOSIS — G4701 Insomnia due to medical condition: Secondary | ICD-10-CM

## 2017-04-01 DIAGNOSIS — F332 Major depressive disorder, recurrent severe without psychotic features: Secondary | ICD-10-CM

## 2017-04-01 DIAGNOSIS — F4010 Social phobia, unspecified: Secondary | ICD-10-CM

## 2017-04-01 LAB — TISSUE TRANSGLUTAMINASE, IGA: (TTG) AB, IGA: 1 U/mL

## 2017-04-01 LAB — PANCREATIC ELASTASE, FECAL: PANCREATIC ELASTASE-1, STL: 238 ug/g

## 2017-04-02 ENCOUNTER — Other Ambulatory Visit: Payer: Self-pay | Admitting: Internal Medicine

## 2017-04-02 ENCOUNTER — Ambulatory Visit (INDEPENDENT_AMBULATORY_CARE_PROVIDER_SITE_OTHER)
Admission: RE | Admit: 2017-04-02 | Discharge: 2017-04-02 | Disposition: A | Discharge status: Home / Self Care | Payer: Medicare HMO | Source: Ambulatory Visit | Attending: Internal Medicine | Admitting: Internal Medicine

## 2017-04-02 DIAGNOSIS — R634 Abnormal weight loss: Secondary | ICD-10-CM

## 2017-04-02 DIAGNOSIS — Z720 Tobacco use: Secondary | ICD-10-CM | POA: Diagnosis not present

## 2017-04-02 DIAGNOSIS — R05 Cough: Secondary | ICD-10-CM | POA: Diagnosis not present

## 2017-04-02 NOTE — Progress Notes (Signed)
cxr 

## 2017-04-03 ENCOUNTER — Other Ambulatory Visit: Payer: Self-pay | Admitting: Internal Medicine

## 2017-04-03 DIAGNOSIS — R634 Abnormal weight loss: Secondary | ICD-10-CM

## 2017-04-05 ENCOUNTER — Encounter (HOSPITAL_COMMUNITY): Payer: Self-pay | Admitting: Psychiatry

## 2017-04-05 ENCOUNTER — Ambulatory Visit (INDEPENDENT_AMBULATORY_CARE_PROVIDER_SITE_OTHER): Payer: Medicare HMO | Admitting: Psychiatry

## 2017-04-05 DIAGNOSIS — F17210 Nicotine dependence, cigarettes, uncomplicated: Secondary | ICD-10-CM | POA: Diagnosis not present

## 2017-04-05 DIAGNOSIS — G4701 Insomnia due to medical condition: Secondary | ICD-10-CM | POA: Diagnosis not present

## 2017-04-05 DIAGNOSIS — Z79899 Other long term (current) drug therapy: Secondary | ICD-10-CM

## 2017-04-05 DIAGNOSIS — F4010 Social phobia, unspecified: Secondary | ICD-10-CM | POA: Diagnosis not present

## 2017-04-05 DIAGNOSIS — F332 Major depressive disorder, recurrent severe without psychotic features: Secondary | ICD-10-CM | POA: Diagnosis not present

## 2017-04-05 MED ORDER — LAMOTRIGINE 100 MG PO TABS
100.0000 mg | ORAL_TABLET | Freq: Two times a day (BID) | ORAL | 0 refills | Status: DC
Start: 2017-04-05 — End: 2017-06-07

## 2017-04-05 MED ORDER — TRAZODONE HCL 300 MG PO TABS: 300.0000 mg | ORAL_TABLET | Freq: Every day | ORAL | 0 refills | Status: DC

## 2017-04-05 NOTE — Progress Notes (Signed)
[place] MD/PA/NP OP Progress Note  04/05/2017 4:01 PM Leslie Lane  MRN:  [id number]  Chief Complaint:  Chief Complaint    Follow-up     HPI: Pt reports she has lost a lot of weight (40 lbs) over the last 12 months. Pt saw her PCP last week and they are doing a work up for pt's symptoms. She has an appt for an MRI on Monday. Pt feels tired all the time and has no motivation to do anything. Her stomach is always upset.  Pt is very stressed by her medical problems.  Pt denies anxiety. Pt feels like she is "down in the dumps". It is all centered on her what her medical workup will show. Sleep is good with Trazodone. She feels like she could nap during the day but is not able to fall asleep when she lies down. Pt reports anhedonia and mild irritability. Pt denies SI/HI.  Pt denies manic and hypomanic symptoms including periods of decreased need for sleep, increased energy, mood lability, impulsivity, FOI, and excessive spending.  Pt states-taking meds as prescribed and denies SE.   Visit Diagnosis:    ICD-10-CM   1. Major depressive disorder, recurrent, severe without psychotic features (HCC) F33.2 lamoTRIgine (LAMICTAL) 100 MG tablet    trazodone (DESYREL) 300 MG tablet  2. Social anxiety disorder F40.10 lamoTRIgine (LAMICTAL) 100 MG tablet  3. Insomnia due to medical condition G47.01 trazodone (DESYREL) 300 MG tablet     Past Psychiatric History:  Anxiety:Yes Bipolar Disorder:No Depression:Yes Mania:No Psychosis:No Schizophrenia:No Personality Disorder:No Hospitalization for psychiatric illness:No History of Electroconvulsive Shock Therapy:No Prior Suicide Attempts:Yes  Seroquel, Trazodone-ineffective  Lexapro  Depakote  Effexor and Cymbalta- ineffective  Wellbutrin- ineffective  Abilify- ineffective  Paxil-effectivebut not the second time it was prescribed         Past Medical History:  Past Medical History:  Diagnosis Date  . ALLERGIC RHINITIS   .  Anemia   . Anxiety   . Arthritis   . BIPOLAR AFFECTIVE DISORDER   . DEPRESSION   . Diabetes mellitus type I ([place])   . DIABETES MELLITUS, TYPE II    follows with endo  . Fibromyalgia   . GERD   . HEPATITIS B   . HYPERLIPIDEMIA   . LOW BACK PAIN, CHRONIC   . MIGRAINE HEADACHE   . NARCOTIC ABUSE    hx of  . NECK PAIN, CHRONIC   . Neuropathy   . SMOKER     Past Surgical History:  Procedure Laterality Date  . CESAREAN SECTION    . TUBAL LIGATION      Family Psychiatric History:  Family History  Problem Relation Age of Onset  . Arthritis Mother   . Colon cancer Mother        ? age of dx  . Arthritis Father   . Kidney disease Father   . Kidney cancer Father   . Heart attack Brother   . Bladder Cancer Sister   . Hyperlipidemia Other   . Kidney cancer Paternal Aunt   . Anxiety disorder Neg Hx   . Bipolar disorder Neg Hx   . Depression Neg Hx   . Breast cancer Neg Hx     Social History:  Social History   Social History  . Marital status: Divorced    Spouse name: N/A  . Number of children: 1  . Years of education: N/A   Occupational History  . diabled    Social History Main Topics  . Smoking  status: Current Every Day Smoker    Packs/day: 1.50    Years: 40.00    Types: Cigarettes  . Smokeless tobacco: Never Used     Comment: tobacco info given 04/18/16  . Alcohol use 0.0 oz/week     Comment: occasionally- once every 2 weeks 1-2 beers  . Drug use: No  . Sexual activity: No   Other Topics Concern  . None   Social History Narrative   Divorced, disabled   Lives with parents    Allergies:  Allergies  Allergen Reactions  . Penicillins Anaphylaxis  . Sulfonamide Derivatives Anaphylaxis    Metabolic Disorder Labs: Lab Results  Component Value Date   HGBA1C 6.5 01/12/2017   No results found for: PROLACTIN Lab Results  Component Value Date   CHOL 186 10/20/2016   TRIG 67.0 10/20/2016   HDL 88.10 10/20/2016   CHOLHDL 2 10/20/2016   VLDL 13.4  10/20/2016   LDLCALC 84 10/20/2016   LDLCALC 92 01/11/2016   Lab Results  Component Value Date   TSH 0.84 03/20/2017   TSH 1.38 10/20/2016    Therapeutic Level Labs: No results found for: LITHIUM No results found for: VALPROATE No components found for:  CBMZ  Current Medications: Current Outpatient Prescriptions  Medication Sig Dispense Refill  . ACCU-CHEK AVIVA PLUS test strip USE AS DIRECTED FOUR TIMES DAILY (Patient taking differently: USE AS DIRECTED UP TO 8 TIMES DAILY) 350 each 2  . ACCU-CHEK SOFTCLIX LANCETS lancets Use as instructed to check blood sugar 4 times per day dx code E10.65 (Patient taking differently: Use as instructed to check blood sugar 8 times per day dx code E10.65) 400 each 1  . Alcohol Swabs (B-D SINGLE USE SWABS REGULAR) PADS Use to test blood sugar 4 times daily Dx code E10.65 200 each 2  . aspirin EC 81 MG tablet Take 81 mg by mouth every morning.    . B-D ULTRAFINE III SHORT PEN 31G X 8 MM MISC USE TO INJECT INSULIN DAILY. 100 each 0  . Blood Glucose Monitoring Suppl (ACCU-CHEK AVIVA PLUS) w/Device KIT USE AS DIRECTED 1 kit 0  . diclofenac sodium (VOLTAREN) 1 % GEL APPLY 2 GRAMS EXTERNALLY TO THE AFFECTED AREA FOUR TIMES DAILY 100 g 0  . gabapentin (NEURONTIN) 100 MG capsule TAKE 1 TO 2 CAPSULES(100 TO 200 MG) BY MOUTH TWICE DAILY 120 capsule 0  . glucagon (GLUCAGON EMERGENCY) 1 MG injection INJECT 1 MG INTO THE SKIN AT ONCE AS NEEDED 2 kit 5  . insulin aspart (NOVOLOG) 100 UNIT/ML injection Sliding Scale 10 units in the am and 8 in the pm. 1-3 units as needed for high blood sugars. 10 mL 11  . insulin degludec (TRESIBA FLEXTOUCH) 100 UNIT/ML SOPN FlexTouch Pen Inject 0.15 mLs (15 Units total) into the skin daily. 15 pen 1  . insulin lispro (HUMALOG) 100 UNIT/ML injection Inject 0.01-0.03 mLs (1-3 Units total) into the skin 2 (two) times daily as needed for high blood sugar. Sliding scale 10 units in am and 8 units at night 30 mL 1  . lamoTRIgine  (LAMICTAL) 100 MG tablet Take 1 tablet (100 mg total) by mouth 2 (two) times daily. 180 tablet 0  . lipase/protease/amylase (CREON) 36000 UNITS CPEP capsule Take 2 capsules (72,000 Units total) by mouth 3 (three) times daily before meals. 300 capsule 3  . loratadine (CLARITIN) 10 MG tablet Take 10 mg by mouth as needed for allergies.    Marland Kitchen lovastatin (MEVACOR) 40 MG tablet Take 1  tablet (40 mg total) by mouth at bedtime. 90 tablet 2  . Magnesium 500 MG TABS Take 1 tablet by mouth as needed.    . metoCLOPramide (REGLAN) 5 MG tablet TAKE 1 TABLET(5 MG) BY MOUTH THREE TIMES DAILY BEFORE MEALS 270 tablet 1  . Multiple Vitamin (MULTIVITAMIN) tablet Take 1 tablet by mouth daily. Reported on 11/22/2015    . omeprazole (PRILOSEC) 40 MG capsule Take 1 capsule (40 mg total) by mouth 2 (two) times daily. Annual appt due in Nov must see provider for future refills 60 capsule 2  . traMADol (ULTRAM) 50 MG tablet TAKE 1 TABLET BY MOUTH EVERY 12 HOURS AS NEEDED FOR MODERATE TO SEVERE PAIN. 60 tablet 0  . trazodone (DESYREL) 300 MG tablet TAKE 1 AND 1/2 TABLETS(450 MG) BY MOUTH AT BEDTIME (Patient taking differently: 300 mg. TAKE 1 AND 1/2 TABLETS(450 MG) BY MOUTH AT BEDTIME) 135 tablet 0  . vitamin A 10000 UNIT capsule Take 10,000 Units by mouth daily.    . vitamin E (VITAMIN E) 1000 UNIT capsule Take 1,000 Units by mouth daily.       No current facility-administered medications for this visit.      Musculoskeletal: Strength & Muscle Tone: within normal limits Gait & Station: normal Patient leans: N/A  Psychiatric Specialty Exam: Review of Systems  Constitutional: Positive for malaise/fatigue and weight loss. Negative for chills and fever.  Gastrointestinal: Positive for abdominal pain and diarrhea. Negative for nausea and vomiting.    Blood pressure 110/64, pulse 73, height _0  (1.626 m), weight 104 lb 3.2 oz (47.3 kg).Body mass index is 17.89 kg/m.  General Appearance: Fairly Groomed and dramatic  weight loss  Eye Contact:  Good  Speech:  Clear and Coherent and Normal Rate  Volume:  Normal  Mood:  Euthymic  Affect:  Full Range  Thought Process:  Goal Directed and Descriptions of Associations: Intact  Orientation:  Full (Time, Place, and Person)  Thought Content: Logical   Suicidal Thoughts:  No  Homicidal Thoughts:  No  Memory:  Immediate;   Good Recent;   Good Remote;   Good  Judgement:  Good  Insight:  Good  Psychomotor Activity:  Normal  Concentration:  Concentration: Good and Attention Span: Good  Recall:  Good  Fund of Knowledge: Good  Language: Good  Akathisia:  No  Handed:  Right  AIMS (if indicated): not done  Assets:  Communication Skills Desire for Improvement Housing  ADL's:  Intact  Cognition: WNL  Sleep:  Good   Screenings: PHQ2-9     Office Visit from 03/26/2017 in [facility] from [facility] Office Visit from 04/13/2014 in [facility] from 02/28/2013 in [facility]  PHQ-2 Total Score  1  0  0  1       Assessment and Plan: MDD-recurrent, severe without psychotic features; Social anxiety disorder; Insomnia; r/o Bipolar disorder   Medication management with supportive therapy. Risks/benefits and SE of the medication discussed. Pt verbalized understanding and verbal consent obtained for treatment.  Affirm with the patient that the medications are taken as ordered. Patient expressed understanding of how their medications were to be used.   Meds: Lamictal 171m po BID for depression Decrease Trazodone 3031mpo qHS insomnia and depression (as pt has only been taking 3006mPt declined treatment with Lithium and TCA;s for mood stabalization   Labs: none  Therapy: brief supportive  therapy provided. Discussed psychosocial stressors in detail.   Encouraged pt to develop daily routine and work on daily goal setting  as a way to improve mood symptoms.   Consultations: encouraged to follow up with PCP  Pt denies SI and is at an acute low risk for suicide. Patient told to call clinic if any problems occur. Patient advised to go to ER if they should develop SI/HI, side effects, or if symptoms worsen. Has crisis numbers to call if needed. Pt verbalized understanding.  F/up in 2 months or sooner if needed   Charlcie Cradle, MD 04/05/2017, 4:01 PM

## 2017-04-09 ENCOUNTER — Ambulatory Visit (INDEPENDENT_AMBULATORY_CARE_PROVIDER_SITE_OTHER)
Admission: RE | Admit: 2017-04-09 | Discharge: 2017-04-09 | Disposition: A | Discharge status: Home / Self Care | Payer: Medicare HMO | Source: Ambulatory Visit | Attending: Internal Medicine | Admitting: Internal Medicine

## 2017-04-09 DIAGNOSIS — R109 Unspecified abdominal pain: Secondary | ICD-10-CM | POA: Diagnosis not present

## 2017-04-09 DIAGNOSIS — I700 Atherosclerosis of aorta: Secondary | ICD-10-CM | POA: Diagnosis not present

## 2017-04-09 DIAGNOSIS — R634 Abnormal weight loss: Secondary | ICD-10-CM | POA: Diagnosis not present

## 2017-04-09 MED ORDER — IOPAMIDOL (ISOVUE-300) INJECTION 61%
100.0000 mL | Freq: Once | INTRAVENOUS | Status: AC | PRN
  Administered 2017-04-09: 100 mL via INTRAVENOUS

## 2017-04-10 ENCOUNTER — Telehealth: Payer: Self-pay | Admitting: Internal Medicine

## 2017-04-10 NOTE — Telephone Encounter (Signed)
Pt called checking on her CT results. Please advise.

## 2017-04-11 ENCOUNTER — Other Ambulatory Visit: Payer: Self-pay | Admitting: Family

## 2017-04-11 ENCOUNTER — Other Ambulatory Visit: Payer: Self-pay | Admitting: Internal Medicine

## 2017-04-11 DIAGNOSIS — I739 Peripheral vascular disease, unspecified: Secondary | ICD-10-CM

## 2017-04-11 NOTE — Telephone Encounter (Signed)
Patient contacted and informed.

## 2017-04-16 ENCOUNTER — Other Ambulatory Visit: Payer: Self-pay

## 2017-04-16 DIAGNOSIS — I739 Peripheral vascular disease, unspecified: Secondary | ICD-10-CM

## 2017-04-17 ENCOUNTER — Other Ambulatory Visit: Payer: Self-pay

## 2017-04-17 ENCOUNTER — Other Ambulatory Visit (INDEPENDENT_AMBULATORY_CARE_PROVIDER_SITE_OTHER): Payer: Medicare HMO

## 2017-04-17 ENCOUNTER — Telehealth: Payer: Self-pay

## 2017-04-17 DIAGNOSIS — R634 Abnormal weight loss: Secondary | ICD-10-CM

## 2017-04-17 DIAGNOSIS — Z8379 Family history of other diseases of the digestive system: Secondary | ICD-10-CM

## 2017-04-17 DIAGNOSIS — R197 Diarrhea, unspecified: Secondary | ICD-10-CM

## 2017-04-17 LAB — IGA: IGA: 201 mg/dL (ref 68–378)

## 2017-04-17 NOTE — Telephone Encounter (Signed)
Spoke with patient regarding coming to the lab today to have IGA drawn.  Patient states she is no longer having diarrhea and she thinks she has gained some weight in her stomach.  She doesn't think he has lost anymore weight.  Order entered for IGA.

## 2017-04-17 NOTE — Telephone Encounter (Signed)
-----   Message from [name], NP sent at 04/16/2017  1:15 PM EDT ----- Leslie Lane, I see her tTg IgA but not her IgA. Can you check on the results? Thanks

## 2017-04-17 NOTE — Telephone Encounter (Signed)
I do not see it on the lab orders. I don't think it was ordered. Please review the labs tab.

## 2017-04-19 ENCOUNTER — Encounter: Payer: Self-pay | Admitting: Internal Medicine

## 2017-04-19 ENCOUNTER — Ambulatory Visit (INDEPENDENT_AMBULATORY_CARE_PROVIDER_SITE_OTHER): Payer: Medicare HMO | Admitting: Internal Medicine

## 2017-04-19 VITALS — BP 120/70 | HR 85 | Temp 98.6°F | Ht 64.0 in | Wt 99.0 lb

## 2017-04-19 DIAGNOSIS — E1065 Type 1 diabetes mellitus with hyperglycemia: Secondary | ICD-10-CM

## 2017-04-19 DIAGNOSIS — R21 Rash and other nonspecific skin eruption: Secondary | ICD-10-CM

## 2017-04-19 NOTE — Assessment & Plan Note (Signed)
Likely related to recent ginger supplement. Have asked her not to resume. No itching. Will likely resolve on its own. Can use cortisone cream and call back if not improved.

## 2017-04-19 NOTE — Progress Notes (Signed)
She came by the office to get this IgA and I spoke with her then about making an appt so she may have already made it. Anytime is fine with me. Thanks

## 2017-04-19 NOTE — Progress Notes (Signed)
   Subjective:    Patient ID: Leslie Lane, female    DOB: 1955/12/23, 61 y.o.   MRN: [id number]  HPI The patient is a 60 YO female coming in for new rash. Started on Tuesday after she started a new ginger supplement from [facility]. She stopped taking it and the rash has continued.On her stomach, leg, arms. The arms are improving. She was scratching the legs initially due to itching but denies itching since that time. Denies fevers or chills. Denies change to soap, bites, detergent change.   Review of Systems  Constitutional: Negative.   Respiratory: Negative for cough, chest tightness and shortness of breath.   Cardiovascular: Negative for chest pain, palpitations and leg swelling.  Gastrointestinal: Negative for abdominal distention, abdominal pain, constipation, diarrhea, nausea and vomiting.  Musculoskeletal: Negative.   Skin: Positive for rash.  Neurological: Negative.       Objective:   Physical Exam  Constitutional: She is oriented to person, place, and time. She appears well-developed and well-nourished.  thin  HENT:  Head: Normocephalic and atraumatic.  Eyes: EOM are normal.  Neck: Normal range of motion.  Cardiovascular: Normal rate and regular rhythm.   Pulmonary/Chest: Effort normal and breath sounds normal. No respiratory distress. She has no wheezes. She has no rales.  Abdominal: Soft. Bowel sounds are normal. She exhibits no distension. There is no tenderness. There is no rebound.  Musculoskeletal: She exhibits no edema.  Neurological: She is alert and oriented to person, place, and time. Coordination normal.  Skin: Skin is warm and dry.  Some redness on the stomach, some broken blood vessels on the leg from scratching but no definitive rash. No bite marks and not consistent with bed bugs.   Psychiatric: She has a normal mood and affect.   Vitals:   04/19/17 1021  BP: 120/70  Pulse: 85  Temp: 98.6 F (37 C)  TempSrc: Oral  SpO2: 97%  Weight: 99 lb (44.9 kg)    Height: 5\' 4"  (1.626 m)      Assessment & Plan:

## 2017-04-19 NOTE — Patient Instructions (Signed)
The rash should go away on its own. If it does not call us and we will send in a cream to help.

## 2017-04-24 ENCOUNTER — Encounter: Payer: Self-pay | Admitting: Nurse Practitioner

## 2017-04-24 ENCOUNTER — Ambulatory Visit (INDEPENDENT_AMBULATORY_CARE_PROVIDER_SITE_OTHER): Payer: Medicare HMO | Admitting: Nurse Practitioner

## 2017-04-24 ENCOUNTER — Encounter (INDEPENDENT_AMBULATORY_CARE_PROVIDER_SITE_OTHER): Payer: Self-pay

## 2017-04-24 VITALS — BP 136/60 | HR 74 | Wt 101.1 lb

## 2017-04-24 DIAGNOSIS — R634 Abnormal weight loss: Secondary | ICD-10-CM

## 2017-04-24 DIAGNOSIS — D509 Iron deficiency anemia, unspecified: Secondary | ICD-10-CM | POA: Diagnosis not present

## 2017-04-24 DIAGNOSIS — R197 Diarrhea, unspecified: Secondary | ICD-10-CM | POA: Diagnosis not present

## 2017-04-24 MED ORDER — NA SULFATE-K SULFATE-MG SULF 17.5-3.13-1.6 GM/177ML PO SOLN
1.0000 | Freq: Once | ORAL | 0 refills | Status: AC
Start: 2017-04-24 — End: 2017-04-24

## 2017-04-24 NOTE — Progress Notes (Signed)
Chief Complaint:  Diarrhea and weight loss  HPI: Patient is a 61 yo female known to Dr. Fuller Plan. She has type I diabetes, hx of depression, chronic lower abdominal pain and a [facility] of celiac disease in sister.  I saw several weeks ago for diarrhea and weight loss. She weighed 134 pounds in Aug, down about 30 pounds to ~102 pounds in Sept of this year. PCP had started her on Creon and diarrhea had improved when I saw her. Surprisingly her celiac studies and fecal elastase were normal. She is here for follow up.Her mother accompanies her again this visit. No further weight loss. BMs vary in consistency from liquid to semi-solid. No significant diarrhea, some days no BMs at all.  She stopped Creon two weeks ago, got tired of taking it. Hasn't had recurrent diarrhea but taking ultram which tends to slow down her BMs. Off the tramadol she can't tell me what BMs are like for sure but can actually go a day or two without any BMs. When this happens she may takes something for constipation.Takes  She had a CT scan of abd/pelvis with contrast and no suspicious lesions were seen.   Past Medical History:  Diagnosis Date  . ALLERGIC RHINITIS   . Anemia   . Anxiety   . Arthritis   . BIPOLAR AFFECTIVE DISORDER   . DEPRESSION   . Diabetes mellitus type I ([place])   . DIABETES MELLITUS, TYPE II    follows with endo  . Fibromyalgia   . GERD   . HEPATITIS B   . HYPERLIPIDEMIA   . LOW BACK PAIN, CHRONIC   . MIGRAINE HEADACHE   . NARCOTIC ABUSE    hx of  . NECK PAIN, CHRONIC   . Neuropathy   . SMOKER     Patient's surgical history, family medical history, social history, medications and allergies were all reviewed in Epic    Physical Exam: BP 136/60   Pulse 74   Wt 101 lb 2 oz (45.9 kg)   BMI 17.36 kg/m   GENERAL: extremely thin white female in NAD PSYCH: :Pleasant, cooperative, normal affect EENT:  conjunctiva pink, mucous membranes moist, neck supple without masses CARDIAC:  RRR, no murmur  heard, no peripheral edema PULM: Normal respiratory effort, lungs CTA bilaterally, no wheezing ABDOMEN:  Nondistended, soft, nontender. No obvious masses, no hepatomegaly,  normal bowel sounds SKIN:  turgor, no lesions seen Musculoskeletal:  Normal muscle tone, normal strength NEURO: Alert and oriented x 3, no focal neurologic deficits    ASSESSMENT and PLAN:  61 year old female with Type I diabetes and a history of loose stools, iron deficiency anemia and 30 pound weight loss over the last year. Fecal elastase normal (but could still have pancreatic insuff).  [place] of celiac disease but her tTg, IgA were normal. TSH is normal. Empirically treated with creon with significant improvement in diarrhea. Took herself off creon two weeks ago, no flagrant diarrhea but still some intermittent loose stools. Taking ultram for pain and that slows bowels done. Not regaining weight but no further loss.   -CT scan abd / pelvis with contrast on 10/8 was unremarkable. -She needs a colonoscopy for, especially with the iron deficiency in absence of celiac disease.  The risks and benefits of the procedure were discussed and the patient agrees to proceed.  -Not sure why she is taking Mg+ but probably not a big deal now since no longer having significant diarrhea. Additionally, the diarrhea she  was having a couple of months ago seems out of proportion to Mg+ dose   [name] , NP 04/24/2017, 3:58 PM

## 2017-04-24 NOTE — Patient Instructions (Signed)
If you are age 63 or older, your body mass index should be between 23-30. Your Body mass index is 17.36 kg/m. If this is out of the aforementioned range listed, please consider follow up with your Primary Care Provider.  If you are age 12 or younger, your body mass index should be between 19-25. Your Body mass index is 17.36 kg/m. If this is out of the aformentioned range listed, please consider follow up with your Primary Care Provider.   We have sent the following medications to your pharmacy for you to pick up at your convenience:  [facility] have been scheduled for a colonoscopy. Please follow written instructions given to you at your visit today.  Please pick up your prep supplies at the pharmacy within the next 1-3 days. If you use inhalers (even only as needed), please bring them with you on the day of your procedure. Your physician has requested that you go to www.startemmi.com and enter the access code given to you at your visit today. This web site gives a general overview about your procedure. However, you should still follow specific instructions given to you by our office regarding your preparation for the procedure.  Thank you.

## 2017-04-25 ENCOUNTER — Encounter: Payer: Self-pay | Admitting: Gastroenterology

## 2017-04-30 NOTE — Progress Notes (Signed)
Reviewed and agree with initial management plan.  Topanga Alvelo T. Sai Zinn, MD FACG 

## 2017-05-01 ENCOUNTER — Ambulatory Visit (INDEPENDENT_AMBULATORY_CARE_PROVIDER_SITE_OTHER): Payer: Medicare HMO | Admitting: Endocrinology

## 2017-05-01 ENCOUNTER — Encounter: Payer: Self-pay | Admitting: Endocrinology

## 2017-05-01 VITALS — BP 120/76 | HR 70 | Ht 64.0 in | Wt 100.0 lb

## 2017-05-01 DIAGNOSIS — E7800 Pure hypercholesterolemia, unspecified: Secondary | ICD-10-CM | POA: Diagnosis not present

## 2017-05-01 DIAGNOSIS — E1065 Type 1 diabetes mellitus with hyperglycemia: Secondary | ICD-10-CM

## 2017-05-01 LAB — POCT GLYCOSYLATED HEMOGLOBIN (HGB A1C): HEMOGLOBIN A1C: 5.9

## 2017-05-01 NOTE — Patient Instructions (Addendum)
Must have Carb snack before planning to be active   Must have snack at bedtime if sugar under 120  For colonoscopy use 5 units

## 2017-05-01 NOTE — Progress Notes (Signed)
Patient ID: Leslie Lane, female   DOB: [birth year], 61 y.o.   MRN: [id number]   Reason for Appointment: Diabetes follow-up   History of Present Illness    Diagnosis: Type 1 DIABETES MELITUS, diagnosed 1967      She has had labile blood sugar control over the last several years even though A1c has been usually around 7% She has had less lability and hypoglycemia with taking b.i.d. Lantus compared once a day She has been very sensitive to fast acting insulin and frequently does not require mealtime coverage She cannot tolerate Toujeo because of reported episode of headache, bloating and nausea with the first dose  RECENT history:  Insulin regimen: Tresiba 7 units daily, Humalog 1-2 p.r.n.    Her last A1c was 6.5  In July and is now 5.9  Current blood sugar patterns and problems identified:  She still has not been able to get the DexCom sensor and she was told by her insurance that this is available only if she is taking 5 injections a day  Her main difficulty is still getting periodically hypoglycemic which is mostly after increase physical activity  Despite reminders she does not take a snack before starting her walking or house cleaning which she does at all different times  Occasionally may have low sugars overnight also which may be partly related to her activity level but otherwise not explained  More recently she has been getting up late morning or early afternoon and not checking blood sugar usually until at least 12 noon  FASTING readings are somewhat variable and only rarely low  He does have inconsistent blood sugar patterns and the rest of the day although at an average blood sugars are mostly around 140-160 range  She says she does feel symptoms when she has low sugars during the night now  Mealtime insulin: She is usually not taking mealtime insulin unless blood sugars are relatively high but today she took 1 unit even though breakfast reading was 187;  on another occasion after taking 1 unit for pizza her blood sugar did get low  Her weight is still down 2 pounds since her last visit but she is eating fairly well  HYPOGLYCEMIA: As above, may not have symptoms with low blood sugars and does not have early warning symptoms. Gets confused frequently and she depends on her mother to recognize low sugars and treat them. Her mother knows how to give Glucagon injection She will treat her low blood sugars usually with juice, does not like glucose tablets; sometimes will eat food at the same time      DIET: She is trying to get protein with eggs at breakfast Meal times: Variable.Marland Kitchen  She will eat more when she is anxious  Monitors blood glucose:  about 4-5 times a day.    Glucometer:   Accu-Chek Aviva  Blood Glucose readings from meter download as follows   Mean values apply above for all meters except median for One Touch  PRE-MEAL Fasting Lunch Dinner Overnight  Overall  Glucose range:  54-274     43-244  39-274   Mean/median: 155    121  133   POST-MEAL PC Breakfast PC Lunch PC Dinner  Glucose range:    48-224   Mean/median:         Wt Readings from Last 3 Encounters:  05/01/17 100 lb (45.4 kg)  04/24/17 101 lb 2 oz (45.9 kg)  04/19/17 99 lb (44.9 kg)  Lab Results  Component Value Date   HGBA1C 5.9 05/01/2017   HGBA1C 6.5 01/12/2017   HGBA1C 6.9 (H) 10/20/2016   Lab Results  Component Value Date   MICROALBUR 3.8 (H) 10/20/2016   LDLCALC 84 10/20/2016   CREATININE 0.74 03/20/2017        Allergies as of 05/01/2017      Reactions   Penicillins Anaphylaxis   Sulfonamide Derivatives Anaphylaxis      Medication List       Accurate as of 05/01/17  4:15 PM. Always use your most recent med list.          ACCU-CHEK AVIVA PLUS test strip Generic drug:  glucose blood USE AS DIRECTED FOUR TIMES DAILY   ACCU-CHEK AVIVA PLUS w/Device Kit USE AS DIRECTED   ACCU-CHEK SOFTCLIX LANCETS lancets Use as instructed to  check blood sugar 4 times per day dx code E10.65   aspirin EC 81 MG tablet Take 81 mg by mouth every morning.   B-D SINGLE USE SWABS REGULAR Pads Use to test blood sugar 4 times daily Dx code E10.65   B-D ULTRAFINE III SHORT PEN 31G X 8 MM Misc Generic drug:  Insulin Pen Needle USE TO INJECT INSULIN DAILY.   diclofenac sodium 1 % Gel Commonly known as:  VOLTAREN APPLY 2 GRAMS EXTERNALLY TO THE AFFECTED AREA FOUR TIMES DAILY   gabapentin 100 MG capsule Commonly known as:  NEURONTIN TAKE 1 TO 2 CAPSULES(100 TO 200 MG) BY MOUTH TWICE DAILY   glucagon 1 MG injection Commonly known as:  GLUCAGON EMERGENCY INJECT 1 MG INTO THE SKIN AT ONCE AS NEEDED   insulin aspart 100 UNIT/ML injection Commonly known as:  NOVOLOG Sliding Scale 10 units in the am and 8 in the pm. 1-3 units as needed for high blood sugars.   insulin degludec 100 UNIT/ML Sopn FlexTouch Pen Commonly known as:  TRESIBA FLEXTOUCH Inject 0.15 mLs (15 Units total) into the skin daily.   lamoTRIgine 100 MG tablet Commonly known as:  LAMICTAL Take 1 tablet (100 mg total) by mouth 2 (two) times daily.   lipase/protease/amylase 36000 UNITS Cpep capsule Commonly known as:  CREON Take 2 capsules (72,000 Units total) by mouth 3 (three) times daily before meals.   loratadine 10 MG tablet Commonly known as:  CLARITIN Take 10 mg by mouth as needed for allergies.   lovastatin 40 MG tablet Commonly known as:  MEVACOR Take 1 tablet (40 mg total) by mouth at bedtime.   Magnesium 500 MG Tabs Take 1 tablet by mouth as needed.   metoCLOPramide 5 MG tablet Commonly known as:  REGLAN TAKE 1 TABLET(5 MG) BY MOUTH THREE TIMES DAILY BEFORE MEALS   multivitamin tablet Take 1 tablet by mouth daily. Reported on 11/22/2015   omeprazole 40 MG capsule Commonly known as:  PRILOSEC Take 1 capsule (40 mg total) by mouth 2 (two) times daily. Annual appt due in Nov must see provider for future refills   traMADol 50 MG  tablet Commonly known as:  ULTRAM TAKE 1 TABLET BY MOUTH EVERY 12 HOURS AS NEEDED FOR MODERATE TO SEVERE PAIN.   trazodone 300 MG tablet Commonly known as:  DESYREL Take 1 tablet (300 mg total) by mouth at bedtime.   vitamin A 10000 UNIT capsule Take 10,000 Units by mouth daily.   vitamin E 1000 UNIT capsule Generic drug:  vitamin E Take 1,000 Units by mouth daily.       Allergies:  Allergies  Allergen Reactions  .  Penicillins Anaphylaxis  . Sulfonamide Derivatives Anaphylaxis    Past Medical History:  Diagnosis Date  . ALLERGIC RHINITIS   . Anemia   . Anxiety   . Arthritis   . BIPOLAR AFFECTIVE DISORDER   . DEPRESSION   . Diabetes mellitus type I ([place])   . DIABETES MELLITUS, TYPE II    follows with endo  . Fibromyalgia   . GERD   . HEPATITIS B   . HYPERLIPIDEMIA   . LOW BACK PAIN, CHRONIC   . MIGRAINE HEADACHE   . NARCOTIC ABUSE    hx of  . NECK PAIN, CHRONIC   . Neuropathy   . SMOKER     Past Surgical History:  Procedure Laterality Date  . CESAREAN SECTION    . TUBAL LIGATION      Family History  Problem Relation Age of Onset  . Arthritis Mother   . Colon cancer Mother        ? age of dx  . Arthritis Father   . Kidney disease Father   . Kidney cancer Father   . Heart attack Brother   . Bladder Cancer Sister   . Hyperlipidemia Other   . Kidney cancer Paternal Aunt   . Anxiety disorder Neg Hx   . Bipolar disorder Neg Hx   . Depression Neg Hx   . Breast cancer Neg Hx     Social History:  reports that she has been smoking Cigarettes.  She has a 60.00 pack-year smoking history. She has never used smokeless tobacco. She reports that she drinks alcohol. She reports that she does not use drugs.  Review of Systems:   WEIGHT loss: This Appears to have leveled off, etiology not found Pending colonoscopy  Wt Readings from Last 3 Encounters:  05/01/17 100 lb (45.4 kg)  04/24/17 101 lb 2 oz (45.9 kg)  04/19/17 99 lb (44.9 kg)    Lab Results   Component Value Date   TSH 0.84 03/20/2017    Has probable gastroparesis treated with Reglan    NEUROPATHY: She has burning in her legs and feet at times She was advised to take as many as 300 mg of gabapentin at bedtime   DEPRESSION: She has had long-standing depression and anxiety with variable level of control and is currently on Lamictal   HYPERLIPIDEMIA: The lipid abnormality consists of elevated LDL   Is  on lovastatin 40 mg with  good control   Lab Results  Component Value Date   CHOL 186 10/20/2016   HDL 88.10 10/20/2016   LDLCALC 84 10/20/2016   LDLDIRECT 101.7 03/26/2012   TRIG 67.0 10/20/2016   CHOLHDL 2 10/20/2016     Has history of small goiter   Lab Results  Component Value Date   TSH 0.84 03/20/2017       Examination:   BP 120/76   Pulse 70   Ht 5' 4"  (1.626 m)   Wt 100 lb (45.4 kg)   SpO2 97%   BMI 17.16 kg/m   Body mass index is 17.16 kg/m.    ASSESSMENT/ PLAN:   Diabetes type 1 See history of present illness for detailed discussion of current insulin, blood sugar patterns, problems identified  Her Main problem his tendency to hypoglycemia in been with minimal doses of 7 units Tresiba once a day A1c is 5.9 However this may be falsely low because of her history of recent anemia Her blood sugars averaging 133 at home and she is checking at various times  Again hypoglycemia seemed to be occurring mostly when she is more active and not eating consistently Discussed again the importance of getting snacks before she is going to be active rather than wait for low blood sugars occur Also discussed balanced meals but some protein and not excessive carbohydrate or fried food She does need to have a mixed bedtime snack with some protein rather than cookies When she has has colonoscopy  she will reduce her basal insulin to 5 units  Also discussed need to the use of the Omnipod insulin pump which should be relatively easy for her to use and  would provide more flexibility in adjusting her basal rate and potentially suspending the pump when she is putting activity She will also look into the coverage for the freestyle Libre sensor   Patient Instructions  Must have Carb snack before planning to be active   Must have snack at bedtime if sugar under 120  For colonoscopy use 5 units   Counseling time on subjects discussed in assessment and plan sections is over 50% of today's 25 minute visit   Casen Pryor 05/01/2017, 4:15 PM

## 2017-05-02 ENCOUNTER — Ambulatory Visit (HOSPITAL_COMMUNITY)
Admission: RE | Admit: 2017-05-02 | Discharge: 2017-05-02 | Disposition: A | Discharge status: Home / Self Care | Payer: Medicare HMO | Source: Ambulatory Visit | Attending: Vascular Surgery | Admitting: Vascular Surgery

## 2017-05-02 ENCOUNTER — Ambulatory Visit (INDEPENDENT_AMBULATORY_CARE_PROVIDER_SITE_OTHER): Payer: Medicare HMO | Admitting: Vascular Surgery

## 2017-05-02 ENCOUNTER — Encounter: Payer: Self-pay | Admitting: Vascular Surgery

## 2017-05-02 VITALS — BP 140/76 | HR 59 | Resp 18 | Ht 64.0 in | Wt 98.7 lb

## 2017-05-02 DIAGNOSIS — M79604 Pain in right leg: Secondary | ICD-10-CM | POA: Diagnosis not present

## 2017-05-02 DIAGNOSIS — F17219 Nicotine dependence, cigarettes, with unspecified nicotine-induced disorders: Secondary | ICD-10-CM

## 2017-05-02 DIAGNOSIS — M79605 Pain in left leg: Secondary | ICD-10-CM | POA: Diagnosis not present

## 2017-05-02 DIAGNOSIS — I739 Peripheral vascular disease, unspecified: Secondary | ICD-10-CM | POA: Insufficient documentation

## 2017-05-02 NOTE — Progress Notes (Signed)
Requested by:  [name], MD [place], [place] [id number]  Reason for consultation: bilateral leg pain    History of Present Illness   Leslie Lane is a 61 y.o. (08-Nov-1955) female with IDDM who presents with cc: bilateral leg pain.  Pt leg sx are not consistent with intermittent claudication or rest pain.  This patient denies any para/anesthesia.  She denies any motor sx.  She denies any poorly healing wounds or frank gangrene.  Past Medical History:  Diagnosis Date  . ALLERGIC RHINITIS   . Anemia   . Anxiety   . Arthritis   . BIPOLAR AFFECTIVE DISORDER   . DEPRESSION   . Diabetes mellitus type I ([place])   . DIABETES MELLITUS, TYPE II    follows with endo  . Fibromyalgia   . GERD   . HEPATITIS B   . HYPERLIPIDEMIA   . LOW BACK PAIN, CHRONIC   . MIGRAINE HEADACHE   . NARCOTIC ABUSE    hx of  . NECK PAIN, CHRONIC   . Neuropathy   . SMOKER     Past Surgical History:  Procedure Laterality Date  . CESAREAN SECTION    . TUBAL LIGATION       Social History   Social History  . Marital status: Divorced    Spouse name: N/A  . Number of children: 1  . Years of education: N/A   Occupational History  . diabled    Social History Main Topics  . Smoking status: Current Every Day Smoker    Packs/day: 1.50    Years: 40.00    Types: Cigarettes  . Smokeless tobacco: Never Used     Comment: tobacco info given 04/18/16  . Alcohol use 0.0 oz/week     Comment: occasionally- once every 2 weeks 1-2 beers  . Drug use: No  . Sexual activity: No   Other Topics Concern  . Not on file   Social History Narrative   Divorced, disabled   Lives with parents    Family History  Problem Relation Age of Onset  . Arthritis Mother   . Colon cancer Mother        ? age of dx  . Arthritis Father   . Kidney disease Father   . Kidney cancer Father   . Heart attack Brother   . Bladder Cancer Sister   . Hyperlipidemia Other   . Kidney cancer Paternal  Aunt   . Anxiety disorder Neg Hx   . Bipolar disorder Neg Hx   . Depression Neg Hx   . Breast cancer Neg Hx     Current Outpatient Prescriptions  Medication Sig Dispense Refill  . ACCU-CHEK AVIVA PLUS test strip USE AS DIRECTED FOUR TIMES DAILY (Patient taking differently: USE AS DIRECTED UP TO 8 TIMES DAILY) 350 each 2  . ACCU-CHEK SOFTCLIX LANCETS lancets Use as instructed to check blood sugar 4 times per day dx code E10.65 (Patient taking differently: Use as instructed to check blood sugar 8 times per day dx code E10.65) 400 each 1  . Alcohol Swabs (B-D SINGLE USE SWABS REGULAR) PADS Use to test blood sugar 4 times daily Dx code E10.65 200 each 2  . aspirin EC 81 MG tablet Take 81 mg by mouth every morning.    . B-D ULTRAFINE III SHORT PEN 31G X 8 MM MISC USE TO INJECT INSULIN DAILY. 100 each 0  . Blood Glucose Monitoring Suppl (ACCU-CHEK AVIVA PLUS) w/Device KIT  USE AS DIRECTED 1 kit 0  . diclofenac sodium (VOLTAREN) 1 % GEL APPLY 2 GRAMS EXTERNALLY TO THE AFFECTED AREA FOUR TIMES DAILY 100 g 0  . gabapentin (NEURONTIN) 100 MG capsule TAKE 1 TO 2 CAPSULES(100 TO 200 MG) BY MOUTH TWICE DAILY 120 capsule 0  . glucagon (GLUCAGON EMERGENCY) 1 MG injection INJECT 1 MG INTO THE SKIN AT ONCE AS NEEDED 2 kit 5  . insulin aspart (NOVOLOG) 100 UNIT/ML injection Sliding Scale 10 units in the am and 8 in the pm. 1-3 units as needed for high blood sugars. 10 mL 11  . insulin degludec (TRESIBA FLEXTOUCH) 100 UNIT/ML SOPN FlexTouch Pen Inject 0.15 mLs (15 Units total) into the skin daily. 15 pen 1  . lamoTRIgine (LAMICTAL) 100 MG tablet Take 1 tablet (100 mg total) by mouth 2 (two) times daily. 180 tablet 0  . lipase/protease/amylase (CREON) 36000 UNITS CPEP capsule Take 2 capsules (72,000 Units total) by mouth 3 (three) times daily before meals. 300 capsule 3  . loratadine (CLARITIN) 10 MG tablet Take 10 mg by mouth as needed for allergies.    Marland Kitchen lovastatin (MEVACOR) 40 MG tablet Take 1 tablet (40 mg  total) by mouth at bedtime. 90 tablet 2  . Magnesium 500 MG TABS Take 1 tablet by mouth as needed.    . metoCLOPramide (REGLAN) 5 MG tablet TAKE 1 TABLET(5 MG) BY MOUTH THREE TIMES DAILY BEFORE MEALS 270 tablet 1  . Multiple Vitamin (MULTIVITAMIN) tablet Take 1 tablet by mouth daily. Reported on 11/22/2015    . omeprazole (PRILOSEC) 40 MG capsule Take 1 capsule (40 mg total) by mouth 2 (two) times daily. Annual appt due in Nov must see provider for future refills 60 capsule 2  . traMADol (ULTRAM) 50 MG tablet TAKE 1 TABLET BY MOUTH EVERY 12 HOURS AS NEEDED FOR MODERATE TO SEVERE PAIN. 60 tablet 0  . trazodone (DESYREL) 300 MG tablet Take 1 tablet (300 mg total) by mouth at bedtime. 90 tablet 0  . vitamin A 10000 UNIT capsule Take 10,000 Units by mouth daily.    . vitamin E (VITAMIN E) 1000 UNIT capsule Take 1,000 Units by mouth daily.       No current facility-administered medications for this visit.     Allergies  Allergen Reactions  . Penicillins Anaphylaxis  . Sulfonamide Derivatives Anaphylaxis    REVIEW OF SYSTEMS (negative unless checked):   Cardiac:  _0  Chest pain or chest pressure? _1  Shortness of breath upon activity? _2  Shortness of breath when lying flat? _3  Irregular heart rhythm?  Vascular:  _4  Pain in calf, thigh, or hip brought on by walking? _5  Pain in feet at night that wakes you up from your sleep? _6  Blood clot in your veins? _7  Leg swelling?  Pulmonary:  _8  Oxygen at home? _9  Productive cough? _10  Wheezing?  Neurologic:  _11  Sudden weakness in arms or legs? _12  Sudden numbness in arms or legs? _13  Sudden onset of difficult speaking or slurred speech? _14  Temporary loss of vision in one eye? _15  Problems with dizziness?  Gastrointestinal:  _16  Blood in stool? _17  Vomited blood?  Genitourinary:  _18  Burning when urinating? _19  Blood in urine?  Psychiatric:  _20  Major depression  Hematologic:  _21  Bleeding problems? _22  Problems with blood  clotting?  Dermatologic:  _23  Rashes or ulcers?  Constitutional:  _24  Fever or chills?  Ear/Nose/Throat:  _25  Change in hearing? _26  Nose bleeds? _27  Sore throat?  Musculoskeletal:  _28  Back pain? _29  Joint pain? _30   Muscle pain?   Physical Examination     Vitals:   05/02/17 1604  BP: 140/76  Pulse: (!) 59  Resp: 18  SpO2: 97%  Weight: 98 lb 11.2 oz (44.8 kg)  Height: _0  (1.626 m)   Body mass index is 16.94 kg/m.  General Alert, O x 3, WD, NAD  [place]/AT,    Ear/Nose/ Throat Hearing grossly intact, nares without erythema or drainage, oropharynx without Erythema or Exudate, Mallampati score: 3,   Eyes PERRLA, EOMI,    Neck Supple, mid-line trachea,    Pulmonary Sym exp, good B air movt, CTA B  Cardiac RRR, Nl S1, S2, no Murmurs, No rubs, No S3,S4  Vascular Vessel Right Left  Radial Palpable Palpable  Brachial Palpable Palpable  Carotid Palpable, No Bruit Palpable, No Bruit  Aorta Not palpable N/A  Femoral Palpable Palpable  Popliteal Not palpable Not palpable  PT Palpable Palpable  DP Palpable Palpable    Gastro- intestinal soft, non-distended, non-tender to palpation, No guarding or rebound, no HSM, no masses, no CVAT B, No palpable prominent aortic pulse,    Musculo- skeletal M/S 5/5 throughout  , Extremities without ischemic changes  , No edema present, No visible varicosities , No Lipodermatosclerosis present  Neurologic Cranial nerves 2-12 intact , Pain and light touch intact in extremities , Motor exam as listed above  Psychiatric Judgement intact, Mood & affect appropriate for pt's clinical situation  Dermatologic See M/S exam for extremity exam, No rashes otherwise noted  Lymphatic  Palpable lymph nodes: None    Non-invasive Vascular Imaging     ABI (05/02/2017)  R:   ABI: 1.12,   PT: tri  DP: tri  TBI:  0.84  L:   ABI: 1.13,   PT: tri  DP: tri  TBI: 0.94   Medical Decision Making   MCKENZIE BOVE is a 61 y.o. female who  presents with: bilateral leg pain, IDDM, no evidence of PAD, active smoker   Waveforms on BLE ABI demonstrate normal blood flow in both legs.  Her sx also are not consistent with arterial disease.  I spent 10-15 minute discussing smoking cessation with this patient.  She has had issues with nicotine patch and SSRI.  We discussed counseling also.  Patient is going to try to quit again on her own.  I discussed in depth with the patient the nature of atherosclerosis, and emphasized the importance of maximal medical management including strict control of blood pressure, blood glucose, and lipid levels, obtaining regular exercise, antiplatelet agents, and cessation of smoking.   The patient is currently Mevacor  The patient is currently on an anti-platelet: ASA.  The patient is aware that without maximal medical management the underlying atherosclerotic disease process will progress, limiting the benefit of any interventions.  Thank you for allowing [place] to participate in this patient's care.   Adele Barthel, MD, FACS [facility] [facility] Office: [phone] Pager: [phone]  05/02/2017, 4:34 PM

## 2017-05-03 ENCOUNTER — Ambulatory Visit (AMBULATORY_SURGERY_CENTER): Payer: Medicare HMO | Admitting: Gastroenterology

## 2017-05-03 ENCOUNTER — Encounter: Payer: Self-pay | Admitting: Gastroenterology

## 2017-05-03 VITALS — BP 166/77 | HR 52 | Temp 97.5°F | Resp 14 | Ht 64.0 in | Wt 101.0 lb

## 2017-05-03 DIAGNOSIS — R634 Abnormal weight loss: Secondary | ICD-10-CM | POA: Diagnosis not present

## 2017-05-03 DIAGNOSIS — D509 Iron deficiency anemia, unspecified: Secondary | ICD-10-CM

## 2017-05-03 DIAGNOSIS — R197 Diarrhea, unspecified: Secondary | ICD-10-CM | POA: Diagnosis present

## 2017-05-03 DIAGNOSIS — E119 Type 2 diabetes mellitus without complications: Secondary | ICD-10-CM | POA: Diagnosis not present

## 2017-05-03 DIAGNOSIS — D128 Benign neoplasm of rectum: Secondary | ICD-10-CM | POA: Diagnosis not present

## 2017-05-03 MED ORDER — SODIUM CHLORIDE 0.9 % IV SOLN: 500.0000 mL | INTRAVENOUS | Status: DC

## 2017-05-03 NOTE — Op Note (Signed)
[facility] Patient Name: Leslie Lane Procedure Date: 05/03/2017 9:11 AM MRN: [id number] Endoscopist: Ladene Artist , MD Age: 61 Referring MD:  Date of Birth: December 20, 1955 Gender: Female Account #: [id number] Procedure:                Colonoscopy Indications:              Clinically significant diarrhea of unexplained                            origin, Unexplained iron deficiency anemia, Weight                            loss Medicines:                Monitored Anesthesia Care Procedure:                Pre-Anesthesia Assessment:                           - Prior to the procedure, a History and Physical                            was performed, and patient medications and                            allergies were reviewed. The patient's tolerance of                            previous anesthesia was also reviewed. The risks                            and benefits of the procedure and the sedation                            options and risks were discussed with the patient.                            All questions were answered, and informed consent                            was obtained. Prior Anticoagulants: The patient has                            taken no previous anticoagulant or antiplatelet                            agents. ASA Grade Assessment: III - A patient with                            severe systemic disease. After reviewing the risks                            and benefits, the patient was deemed in  satisfactory condition to undergo the procedure.                           After obtaining informed consent, the colonoscope                            was passed under direct vision. Throughout the                            procedure, the patient's blood pressure, pulse, and                            oxygen saturations were monitored continuously. The                            Colonoscope was introduced through the anus and                         advanced to the the cecum, identified by                            appendiceal orifice and ileocecal valve. The                            ileocecal valve, appendiceal orifice, and rectum                            were photographed. The quality of the bowel                            preparation was excellent. The colonoscopy was                            performed without difficulty. The patient tolerated                            the procedure well. Scope In: 9:24:28 AM Scope Out: 9:42:02 AM Scope Withdrawal Time: 0 hours 13 minutes 59 seconds  Total Procedure Duration: 0 hours 17 minutes 34 seconds  Findings:                 The perianal and digital rectal examinations were                            normal.                           A 6 mm polyp was found in the rectum. The polyp was                            sessile. The polyp was removed with a cold snare.                            Resection and retrieval were complete.  Internal hemorrhoids were found during                            retroflexion. The hemorrhoids were small and Grade                            I (internal hemorrhoids that do not prolapse).                           The exam was otherwise without abnormality on                            direct and retroflexion views. Random biopsies                            obtained. Complications:            No immediate complications. Estimated blood loss:                            None. Estimated Blood Loss:     Estimated blood loss: none. Impression:               - One 6 mm polyp in the rectum, removed with a cold                            snare. Resected and retrieved.                           - Internal hemorrhoids.                           - The examination was otherwise normal on direct                            and retroflexion views. Random biopsies obtained. Recommendation:           - Repeat colonoscopy in  5 years for surveillance if                            polyp is precancerous, otherwise 10 years.                           - Patient has a contact number available for                            emergencies. The signs and symptoms of potential                            delayed complications were discussed with the                            patient. Return to normal activities tomorrow.                            Written discharge instructions were provided to the  patient.                           - Resume previous diet.                           - Continue present medications.                           - Await pathology results. Ladene Artist, MD 05/03/2017 9:45:39 AM This report has been signed electronically.

## 2017-05-03 NOTE — Progress Notes (Signed)
Called to room to assist during endoscopic procedure.  Patient ID and intended procedure confirmed with present staff. Received instructions for my participation in the procedure from the performing physician.  

## 2017-05-03 NOTE — Progress Notes (Signed)
Report given to PACU, vss 

## 2017-05-03 NOTE — Progress Notes (Signed)
Pt states, "I have seizures if my blood sugar gets in the 20's.  It hasn't happened in a long time."   Blood sugar is 137 upon admission.  CRNA, C Wall, made aware.

## 2017-05-03 NOTE — Patient Instructions (Signed)
Impression/Recommendations:  Polyp handout given to patient. Hemorrhoid handout given to patient.  Repeat colonoscopy in 5 years for surveillance if polyp is precancerous, otherwise repeat in 10 years.  Resume previous diet. Continue present medications.  Await pathology results.  YOU HAD AN ENDOSCOPIC PROCEDURE TODAY AT [facility]:   Refer to the procedure report that was given to you for any specific questions about what was found during the examination.  If the procedure report does not answer your questions, please call your gastroenterologist to clarify.  If you requested that your care partner not be given the details of your procedure findings, then the procedure report has been included in a sealed envelope for you to review at your convenience later.  YOU SHOULD EXPECT: Some feelings of bloating in the abdomen. Passage of more gas than usual.  Walking can help get rid of the air that was put into your GI tract during the procedure and reduce the bloating. If you had a lower endoscopy (such as a colonoscopy or flexible sigmoidoscopy) you may notice spotting of blood in your stool or on the toilet paper. If you underwent a bowel prep for your procedure, you may not have a normal bowel movement for a few days.  Please Note:  You might notice some irritation and congestion in your nose or some drainage.  This is from the oxygen used during your procedure.  There is no need for concern and it should clear up in a day or so.  SYMPTOMS TO REPORT IMMEDIATELY:   Following lower endoscopy (colonoscopy or flexible sigmoidoscopy):  Excessive amounts of blood in the stool  Significant tenderness or worsening of abdominal pains  Swelling of the abdomen that is new, acute  Fever of 100F or higher For urgent or emergent issues, a gastroenterologist can be reached at any hour by calling [phone].   DIET:  We do recommend a small meal at first, but then you may proceed to  your regular diet.  Drink plenty of fluids but you should avoid alcoholic beverages for 24 hours.  ACTIVITY:  You should plan to take it easy for the rest of today and you should NOT DRIVE or use heavy machinery until tomorrow (because of the sedation medicines used during the test).    FOLLOW UP: Our staff will call the number listed on your records the next business day following your procedure to check on you and address any questions or concerns that you may have regarding the information given to you following your procedure. If we do not reach you, we will leave a message.  However, if you are feeling well and you are not experiencing any problems, there is no need to return our call.  We will assume that you have returned to your regular daily activities without incident.  If any biopsies were taken you will be contacted by phone or by letter within the next 1-3 weeks.  Please call us at [phone] if you have not heard about the biopsies in 3 weeks.    SIGNATURES/CONFIDENTIALITY: You and/or your care partner have signed paperwork which will be entered into your electronic medical record.  These signatures attest to the fact that that the information above on your After Visit Summary has been reviewed and is understood.  Full responsibility of the confidentiality of this discharge information lies with you and/or your care-partner.

## 2017-05-04 ENCOUNTER — Telehealth: Payer: Self-pay | Admitting: *Deleted

## 2017-05-04 NOTE — Telephone Encounter (Signed)
No answer, left message to call if questions or concerns. 

## 2017-05-04 NOTE — Telephone Encounter (Signed)
  Follow up Call-  Call back number 05/03/2017  Post procedure Call Back phone  # cell  [phone]  Permission to leave phone message Yes  Some recent data might be hidden     Patient questions:  Do you have a fever, pain , or abdominal swelling? No. Pain Score  0 *  Have you tolerated food without any problems? Yes.    Have you been able to return to your normal activities? Yes.    Do you have any questions about your discharge instructions: Diet   No. Medications  No. Follow up visit  No.  Do you have questions or concerns about your Care? No.  Actions: * If pain score is 4 or above: No action needed, pain <4.

## 2017-05-11 ENCOUNTER — Encounter: Payer: Self-pay | Admitting: Gastroenterology

## 2017-05-17 ENCOUNTER — Other Ambulatory Visit: Payer: Self-pay | Admitting: Internal Medicine

## 2017-05-18 ENCOUNTER — Encounter: Payer: Self-pay | Admitting: Endocrinology

## 2017-05-18 DIAGNOSIS — Z794 Long term (current) use of insulin: Secondary | ICD-10-CM | POA: Diagnosis not present

## 2017-05-18 DIAGNOSIS — H524 Presbyopia: Secondary | ICD-10-CM | POA: Diagnosis not present

## 2017-05-18 DIAGNOSIS — H5203 Hypermetropia, bilateral: Secondary | ICD-10-CM | POA: Diagnosis not present

## 2017-05-18 DIAGNOSIS — H25093 Other age-related incipient cataract, bilateral: Secondary | ICD-10-CM | POA: Diagnosis not present

## 2017-05-18 DIAGNOSIS — E109 Type 1 diabetes mellitus without complications: Secondary | ICD-10-CM | POA: Diagnosis not present

## 2017-05-18 DIAGNOSIS — I10 Essential (primary) hypertension: Secondary | ICD-10-CM | POA: Diagnosis not present

## 2017-05-18 DIAGNOSIS — H52223 Regular astigmatism, bilateral: Secondary | ICD-10-CM | POA: Diagnosis not present

## 2017-05-18 LAB — HM DIABETES EYE EXAM

## 2017-05-22 DIAGNOSIS — L720 Epidermal cyst: Secondary | ICD-10-CM | POA: Diagnosis not present

## 2017-05-22 DIAGNOSIS — L700 Acne vulgaris: Secondary | ICD-10-CM | POA: Diagnosis not present

## 2017-05-29 NOTE — Progress Notes (Signed)
Subjective:   Leslie Lane is a 61 y.o. female who presents for an Initial Medicare Annual Wellness Visit.  Review of Systems    No ROS.  Medicare Wellness Visit. Additional risk factors are reflected in the social history.   Cardiac Risk Factors include: advanced age (>66mn, >>5women);diabetes mellitus Sleep patterns: gets up 2-3 times nightly to void and sleeps 6-8 hours nightly.    Home Safety/Smoke Alarms: Feels safe in home. Smoke alarms in place.  Living environment; residence and Firearm Safety: 1-story house/ trailer, no firearms. Lives with parents, no needs for DME, good support system Seat Belt Safety/Bike Helmet: Wears seat belt.     Objective:    Today's Vitals   05/30/17 1055  BP: 128/78  Pulse: 71  Temp: 98.2 F (36.8 C)  TempSrc: Oral  SpO2: 98%  Weight: 99 lb (44.9 kg)  Height: _0  (1.626 m)   Body mass index is 16.99 kg/m.   Current Medications (verified) Outpatient Encounter Medications as of 05/30/2017  Medication Sig  . ACCU-CHEK AVIVA PLUS test strip USE AS DIRECTED FOUR TIMES DAILY (Patient taking differently: USE AS DIRECTED UP TO 8 TIMES DAILY)  . ACCU-CHEK SOFTCLIX LANCETS lancets Use as instructed to check blood sugar 4 times per day dx code E10.65 (Patient taking differently: Use as instructed to check blood sugar 8 times per day dx code E10.65)  . Alcohol Swabs (B-D SINGLE USE SWABS REGULAR) PADS Use to test blood sugar 4 times daily Dx code E10.65  . aspirin EC 81 MG tablet Take 81 mg by mouth every morning.  . B-D ULTRAFINE III SHORT PEN 31G X 8 MM MISC USE TO INJECT INSULIN DAILY.  .Marland KitchenBlood Glucose Monitoring Suppl (ACCU-CHEK AVIVA PLUS) w/Device KIT USE AS DIRECTED  . diclofenac sodium (VOLTAREN) 1 % GEL APPLY 2 GRAMS EXTERNALLY TO THE AFFECTED AREA FOUR TIMES DAILY  . gabapentin (NEURONTIN) 100 MG capsule TAKE 1 TO 2 CAPSULES(100 TO 200 MG) BY MOUTH TWICE DAILY  . glucagon (GLUCAGON EMERGENCY) 1 MG injection INJECT 1 MG INTO THE  SKIN AT ONCE AS NEEDED  . insulin aspart (NOVOLOG) 100 UNIT/ML injection Sliding Scale 10 units in the am and 8 in the pm. 1-3 units as needed for high blood sugars.  . insulin degludec (TRESIBA FLEXTOUCH) 100 UNIT/ML SOPN FlexTouch Pen Inject 0.15 mLs (15 Units total) into the skin daily.  .Marland KitchenlamoTRIgine (LAMICTAL) 100 MG tablet Take 1 tablet (100 mg total) by mouth 2 (two) times daily.  . lipase/protease/amylase (CREON) 36000 UNITS CPEP capsule Take 2 capsules (72,000 Units total) by mouth 3 (three) times daily before meals.  .Marland Kitchenloratadine (CLARITIN) 10 MG tablet Take 10 mg by mouth as needed for allergies.  .Marland Kitchenlovastatin (MEVACOR) 40 MG tablet Take 1 tablet (40 mg total) by mouth at bedtime.  . Magnesium 500 MG TABS Take 1 tablet by mouth as needed.  . metoCLOPramide (REGLAN) 5 MG tablet TAKE 1 TABLET(5 MG) BY MOUTH THREE TIMES DAILY BEFORE MEALS  . Multiple Vitamin (MULTIVITAMIN) tablet Take 1 tablet by mouth daily. Reported on 11/22/2015  . omeprazole (PRILOSEC) 40 MG capsule Take 1 capsule (40 mg total) by mouth 2 (two) times daily. Annual appt due in Nov must see provider for future refills  . traMADol (ULTRAM) 50 MG tablet TAKE 1 TABLET BY MOUTH EVERY 12 HOURS AS NEEDED FOR MODERATE TO SEVERE PAIN.  . trazodone (DESYREL) 300 MG tablet Take 1 tablet (300 mg total) by mouth at bedtime.  .Marland Kitchen  vitamin A 10000 UNIT capsule Take 10,000 Units by mouth daily.  . vitamin E (VITAMIN E) 1000 UNIT capsule Take 1,000 Units by mouth daily.    Marland Kitchen guaiFENesin (ROBITUSSIN) 100 MG/5ML SOLN Take 10 mLs (200 mg total) by mouth every 4 (four) hours as needed for cough or to loosen phlegm.   No facility-administered encounter medications on file as of 05/30/2017.     Allergies (verified) Penicillins and Sulfonamide derivatives   History: Past Medical History:  Diagnosis Date  . ALLERGIC RHINITIS   . Allergy   . Anemia   . Anxiety   . Arthritis   . BIPOLAR AFFECTIVE DISORDER   . Cataract   . DEPRESSION     . Diabetes mellitus type I ([place])   . DIABETES MELLITUS, TYPE II    follows with endo  . Fibromyalgia   . GERD   . HEPATITIS B   . HYPERLIPIDEMIA   . LOW BACK PAIN, CHRONIC   . MIGRAINE HEADACHE   . NARCOTIC ABUSE    hx of  . NECK PAIN, CHRONIC   . Neuropathy   . Seizures ([place])    pt states, "if my blood sugar drops to the 20's, I convulse.  It hasn't happened in a long time."  . SMOKER    Past Surgical History:  Procedure Laterality Date  . CESAREAN SECTION    . COLONOSCOPY    . TUBAL LIGATION    . UPPER GASTROINTESTINAL ENDOSCOPY     Family History  Problem Relation Age of Onset  . Arthritis Mother   . Colon cancer Mother        ? age of dx  . Arthritis Father   . Kidney disease Father   . Kidney cancer Father   . Heart attack Brother   . Bladder Cancer Sister   . Hyperlipidemia Other   . Kidney cancer Paternal Aunt   . Anxiety disorder Neg Hx   . Bipolar disorder Neg Hx   . Depression Neg Hx   . Breast cancer Neg Hx   . Esophageal cancer Neg Hx   . Stomach cancer Neg Hx   . Rectal cancer Neg Hx    Social History   Occupational History  . Occupation: diabled  Tobacco Use  . Smoking status: Current Every Day Smoker    Packs/day: 1.50    Years: 40.00    Pack years: 60.00    Types: Cigarettes  . Smokeless tobacco: Never Used  . Tobacco comment: tobacco info given 04/18/16  Substance and Sexual Activity  . Alcohol use: Yes    Alcohol/week: 0.0 oz    Comment: occasionally- once every 2 weeks 1-2 beers  . Drug use: No  . Sexual activity: No    Birth control/protection: Surgical, Abstinence    Tobacco Counseling Ready to quit: Not Answered Counseling given: Not Answered Comment: tobacco info given 04/18/16   Activities of Daily Living In your present state of health, do you have any difficulty performing the following activities: 05/30/2017  Hearing? N  Vision? N  Difficulty concentrating or making decisions? N  Walking or climbing stairs? N   Dressing or bathing? N  Doing errands, shopping? N  Preparing Food and eating ? N  Using the Toilet? N  In the past six months, have you accidently leaked urine? N  Do you have problems with loss of bowel control? N  Managing your Medications? N  Managing your Finances? N  Housekeeping or managing your Housekeeping? N  Some recent data might be hidden    Immunizations and Health Maintenance Immunization History  Administered Date(s) Administered  . Influenza Split 03/22/2011, 03/26/2012  . Influenza,inj,Quad PF,6+ Mos 02/28/2013, 04/13/2014, 05/13/2015, 02/28/2016, 03/26/2017  . Pneumococcal Conjugate-13 07/24/2016  . Pneumococcal Polysaccharide-23 07/31/2013  . Td 07/28/2011  . Zoster 05/31/2016   There are no preventive care reminders to display for this patient.  Patient Care Team: [name], MD as PCP - General (Internal Medicine) [name], MD as Consulting Physician (Endocrinology) Amil Amen, MD as Consulting Physician (Psychiatry) Ricard Dillon, MD as Consulting Physician (Psychiatry) Ladene Artist, MD (Gastroenterology) Franchot Gallo, MD (Urology)  Indicate any recent Medical Services you may have received from other than [facility] providers in the past year (date may be approximate).     Assessment:   This is a routine wellness examination for Leslie Lane. Physical assessment deferred to PCP.   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 Declined audiology referral Vision Screening Comments: appointment yearly Dr. Einar Gip, Dr. Talbert Forest  Dietary issues and exercise activities discussed: Current Exercise Habits: The patient does not participate in regular exercise at present  Diet (meal preparation, eat out, water intake, caffeinated beverages, dairy products, fruits and vegetables): in general, a "healthy" diet  , well balanced   Reviewed heart healthy and diabetic diet, encouraged  patient to increase daily water intake.    Goals    . Patient Stated     Start to call friends to go out and socialize as much as possible.       Depression Screen PHQ 2/9 Scores 05/30/2017 03/26/2017 03/25/2015 04/13/2014 02/28/2013  PHQ - 2 Score 2 1 0 0 1  PHQ- 9 Score 6 - - - -    Fall Risk Fall Risk  05/30/2017 03/25/2015 04/13/2014 02/28/2013  Falls in the past year? Yes No No No  Number falls in past yr: 2 or more - - -  Risk Factor Category  High Fall Risk - - -  Follow up Falls prevention discussed - - -    Cognitive Function: MMSE - Mini Mental State Exam 05/30/2017  Orientation to time 5  Orientation to Place 5  Registration 3  Attention/ Calculation 3  Recall 2  Language- name 2 objects 2  Language- repeat 1  Language- follow 3 step command 3  Language- read & follow direction 1  Write a sentence 1  Copy design 1  Total score 27        Screening Tests Health Maintenance  Topic Date Due  . PAP SMEAR  08/18/2017  . URINE MICROALBUMIN  10/20/2017  . HEMOGLOBIN A1C  10/30/2017  . FOOT EXAM  04/19/2018  . OPHTHALMOLOGY EXAM  05/03/2018  . PNEUMOCOCCAL POLYSACCHARIDE VACCINE (2) 07/31/2018  . MAMMOGRAM  10/12/2018  . TETANUS/TDAP  07/27/2021  . COLONOSCOPY  05/03/2022  . INFLUENZA VACCINE  Completed  . Hepatitis C Screening  Completed  . HIV Screening  Completed      Plan:  Start doing brain stimulating activities (puzzles, reading, adult coloring books, staying active) to keep memory sharp.   Continue to eat heart healthy diet (full of fruits, vegetables, whole grains, lean protein, water--limit salt, fat, and sugar intake) and increase physical activity as tolerated.   I have personally reviewed and noted the following in the patient's chart:   . Medical and social history . Use of alcohol, tobacco or illicit drugs  . Current medications and supplements . Functional ability  and status . Nutritional status . Physical activity . Advanced  directives . List of other physicians . Vitals . Screenings to include cognitive, depression, and falls . Referrals and appointments  In addition, I have reviewed and discussed with patient certain preventive protocols, quality metrics, and best practice recommendations. A written personalized care plan for preventive services as well as general preventive health recommendations were provided to patient.     Michiel Cowboy, RN   05/30/2017

## 2017-05-30 ENCOUNTER — Encounter: Payer: Self-pay | Admitting: Internal Medicine

## 2017-05-30 ENCOUNTER — Ambulatory Visit: Payer: Medicare HMO | Admitting: Internal Medicine

## 2017-05-30 VITALS — BP 128/78 | HR 71 | Temp 98.2°F | Ht 64.0 in | Wt 99.0 lb

## 2017-05-30 DIAGNOSIS — R634 Abnormal weight loss: Secondary | ICD-10-CM | POA: Diagnosis not present

## 2017-05-30 DIAGNOSIS — F17210 Nicotine dependence, cigarettes, uncomplicated: Secondary | ICD-10-CM

## 2017-05-30 DIAGNOSIS — E440 Moderate protein-calorie malnutrition: Secondary | ICD-10-CM | POA: Diagnosis not present

## 2017-05-30 DIAGNOSIS — Z0000 Encounter for general adult medical examination without abnormal findings: Secondary | ICD-10-CM | POA: Diagnosis not present

## 2017-05-30 MED ORDER — GUAIFENESIN 100 MG/5ML PO SOLN: 10.0000 mL | ORAL | 6 refills | Status: DC | PRN

## 2017-05-30 NOTE — Progress Notes (Signed)
   Subjective:    Patient ID: Leslie Lane, female    DOB: June 15, 1956, 61 y.o.   MRN: [id number]  HPI The patient is a 61 YO female coming in for physical.  PMH, [place], social history reviewed and updated.  Review of Systems  Constitutional: Positive for unexpected weight change. Negative for activity change, chills, diaphoresis and fatigue.  HENT: Negative.   Eyes: Negative.   Respiratory: Negative for cough, chest tightness and shortness of breath.   Cardiovascular: Negative for chest pain, palpitations and leg swelling.  Gastrointestinal: Positive for diarrhea. Negative for abdominal distention, abdominal pain, constipation, nausea and vomiting.  Musculoskeletal: Negative.   Skin: Negative.   Neurological: Negative.   Psychiatric/Behavioral: Negative.       Objective:   Physical Exam  Constitutional: She is oriented to person, place, and time. She appears well-developed and well-nourished.  HENT:  Head: Normocephalic and atraumatic.  Eyes: EOM are normal.  Neck: Normal range of motion.  Cardiovascular: Normal rate and regular rhythm.  Pulmonary/Chest: Effort normal and breath sounds normal. No respiratory distress. She has no wheezes. She has no rales.  Abdominal: Soft. Bowel sounds are normal. She exhibits no distension. There is no tenderness. There is no rebound.  Musculoskeletal: She exhibits no edema.  Neurological: She is alert and oriented to person, place, and time. Coordination normal.  Skin: Skin is warm and dry.  Psychiatric: She has a normal mood and affect.   Vitals:   05/30/17 1055  BP: 128/78  Pulse: 71  Temp: 98.2 F (36.8 C)  TempSrc: Oral  SpO2: 98%  Weight: 99 lb (44.9 kg)  Height: 5\' 4"  (1.626 m)      Assessment & Plan:

## 2017-05-30 NOTE — Patient Instructions (Addendum)
Start doing brain stimulating activities (puzzles, reading, adult coloring books, staying active) to keep memory sharp.   Continue to eat heart healthy diet (full of fruits, vegetables, whole grains, lean protein, water--limit salt, fat, and sugar intake) and increase physical activity as tolerated.   Ms. Tomey , Thank you for taking time to come for your Medicare Wellness Visit. I appreciate your ongoing commitment to your health goals. Please review the following plan we discussed and let me know if I can assist you in the future.   These are the goals we discussed: Goals    . Patient Stated     Start to call friends to go out and socialize as much as possible.        This is a list of the screening recommended for you and due dates:  Health Maintenance  Topic Date Due  . Eye exam for diabetics  05/17/2017  . Pap Smear  08/18/2017  . Urine Protein Check  10/20/2017  . Hemoglobin A1C  10/30/2017  . Complete foot exam   04/19/2018  . Pneumococcal vaccine (2) 07/31/2018  . Mammogram  10/12/2018  . Tetanus Vaccine  07/27/2021  . Colon Cancer Screening  05/03/2022  . Flu Shot  Completed  .  Hepatitis C: One time screening is recommended by Center for Disease Control  (CDC) for  adults born from [birth year] through 1967.   Completed  . HIV Screening  Completed

## 2017-06-01 DIAGNOSIS — E46 Unspecified protein-calorie malnutrition: Secondary | ICD-10-CM | POA: Insufficient documentation

## 2017-06-01 DIAGNOSIS — Z0000 Encounter for general adult medical examination without abnormal findings: Secondary | ICD-10-CM | POA: Insufficient documentation

## 2017-06-01 DIAGNOSIS — Z0001 Encounter for general adult medical examination with abnormal findings: Secondary | ICD-10-CM | POA: Insufficient documentation

## 2017-06-01 HISTORY — DX: Unspecified protein-calorie malnutrition: E46

## 2017-06-01 NOTE — Assessment & Plan Note (Signed)
Weight is down about 60 pounds from her usual and she is currently maintaining but not able to gain. We talked about nutrition and calorie goals with boost.

## 2017-06-01 NOTE — Progress Notes (Signed)
Patient ID: Leslie Lane, female   DOB: Dec 16, 1955, 61 y.o.   MRN: [id number] Medical screening examination/treatment/procedure(s) were performed by non-physician practitioner and as supervising physician I was immediately available for consultation/collaboration. I agree with above. [name], MD

## 2017-06-01 NOTE — Assessment & Plan Note (Signed)
Talked to her about cessation and patches. She does not feel able to stop and admits that most of her smoking is out of boredom. When I suggest several other habits she could try including art, painting, coloring, knitting, crochet she is not interested. Reminded that she is already suffering health consequences from smoking and that she could get further.

## 2017-06-01 NOTE — Assessment & Plan Note (Signed)
Weight stable today but not increasing. Seeing GI and more stable bowel movements but likely some degree of malabsorption.

## 2017-06-01 NOTE — Assessment & Plan Note (Signed)
Flu and pneumonia and tetanus up to date. Colonoscopy and pap smear up to date. Counseled about need to stop smoking. Counseled about sun safety and mole surveillance. Given 10 year screening recommendations.

## 2017-06-07 ENCOUNTER — Other Ambulatory Visit: Payer: Self-pay

## 2017-06-07 ENCOUNTER — Ambulatory Visit (INDEPENDENT_AMBULATORY_CARE_PROVIDER_SITE_OTHER): Payer: Medicare HMO | Admitting: Psychiatry

## 2017-06-07 ENCOUNTER — Encounter (HOSPITAL_COMMUNITY): Payer: Self-pay | Admitting: Psychiatry

## 2017-06-07 DIAGNOSIS — F4010 Social phobia, unspecified: Secondary | ICD-10-CM | POA: Diagnosis not present

## 2017-06-07 DIAGNOSIS — G4701 Insomnia due to medical condition: Secondary | ICD-10-CM | POA: Diagnosis not present

## 2017-06-07 DIAGNOSIS — F332 Major depressive disorder, recurrent severe without psychotic features: Secondary | ICD-10-CM | POA: Diagnosis not present

## 2017-06-07 DIAGNOSIS — F17210 Nicotine dependence, cigarettes, uncomplicated: Secondary | ICD-10-CM | POA: Diagnosis not present

## 2017-06-07 MED ORDER — TRAZODONE HCL 300 MG PO TABS: 300.0000 mg | ORAL_TABLET | Freq: Every day | ORAL | 0 refills | Status: DC

## 2017-06-07 MED ORDER — LAMOTRIGINE 100 MG PO TABS: 100.0000 mg | ORAL_TABLET | Freq: Two times a day (BID) | ORAL | 0 refills | Status: DC

## 2017-06-07 NOTE — Progress Notes (Signed)
BH MD/PA/NP OP Progress Note  06/07/2017 11:43 AM Leslie Lane  MRN:  [id number]  Chief Complaint:  Chief Complaint    Follow-up; Depression     HPI: Pt states energy and motivation are low. She thinks it has to do with her blood sugar. Pt is eating a lot at night so that she won't wake up at night. Some nights when she doesn't eat a lot she will wake up 2-3x/night even with Trazodone. Pt is napping during the day when she can fall asleep. She is continuing to lose weight. Pt is easily bored and often finds chores to do to keep busy. Pt has friends and family that  come over every Friday. Pt has on/off depression and thinks it is related to health issues especially her diabetes. Pt denies isolation and anhedonia. Pt denies SI/HI.  Pt denies manic and hypomanic symptoms including periods of decreased need for sleep, increased energy, mood lability, impulsivity, FOI, and excessive spending.  Pt stays nervous all the time.   Pt states-taking meds as prescribed and denies SE.   Visit Diagnosis:    ICD-10-CM   1. Insomnia due to medical condition G47.01 trazodone (DESYREL) 300 MG tablet  2. Major depressive disorder, recurrent, severe without psychotic features ([place]) F33.2 trazodone (DESYREL) 300 MG tablet    lamoTRIgine (LAMICTAL) 100 MG tablet  3. Social anxiety disorder F40.10 lamoTRIgine (LAMICTAL) 100 MG tablet      Past Psychiatric History:  Anxiety: Yes Bipolar Disorder: No Depression: Yes Mania: No Psychosis: No Schizophrenia: No Personality Disorder: No  Hospitalization for psychiatric illness: No History of Electroconvulsive Shock Therapy: No Prior Suicide Attempts: Yes  Seroquel, Trazodone-ineffective   Lexapro   Depakote   Effexor and Cymbalta- ineffective   Wellbutrin- ineffective   Abilify- ineffective   Paxil-effective but not the second time it was prescribed         Past Medical History:  Past Medical History:  Diagnosis Date  . ALLERGIC RHINITIS    . Allergy   . Anemia   . Anxiety   . Arthritis   . BIPOLAR AFFECTIVE DISORDER   . Cataract   . DEPRESSION   . Diabetes mellitus type I ([place])   . DIABETES MELLITUS, TYPE II    follows with endo  . Fibromyalgia   . GERD   . HEPATITIS B   . HYPERLIPIDEMIA   . LOW BACK PAIN, CHRONIC   . MIGRAINE HEADACHE   . NARCOTIC ABUSE    hx of  . NECK PAIN, CHRONIC   . Neuropathy   . Seizures ([place])    pt states, "if my blood sugar drops to the 20's, I convulse.  It hasn't happened in a long time."  . SMOKER     Past Surgical History:  Procedure Laterality Date  . CESAREAN SECTION    . COLONOSCOPY    . TUBAL LIGATION    . UPPER GASTROINTESTINAL ENDOSCOPY      Family Psychiatric History:  Family History  Problem Relation Age of Onset  . Arthritis Mother   . Colon cancer Mother        ? age of dx  . Arthritis Father   . Kidney disease Father   . Kidney cancer Father   . Heart attack Brother   . Bladder Cancer Sister   . Hyperlipidemia Other   . Kidney cancer Paternal Aunt   . Anxiety disorder Neg Hx   . Bipolar disorder Neg Hx   . Depression Neg Hx   .  Breast cancer Neg Hx   . Esophageal cancer Neg Hx   . Stomach cancer Neg Hx   . Rectal cancer Neg Hx     Social History:  Social History   Socioeconomic History  . Marital status: Divorced    Spouse name: None  . Number of children: 1  . Years of education: None  . Highest education level: None  Social Needs  . Financial resource strain: None  . Food insecurity - worry: None  . Food insecurity - inability: None  . Transportation needs - medical: None  . Transportation needs - non-medical: None  Occupational History  . Occupation: diabled  Tobacco Use  . Smoking status: Current Every Day Smoker    Packs/day: 1.50    Years: 40.00    Pack years: 60.00    Types: Cigarettes  . Smokeless tobacco: Never Used  . Tobacco comment: tobacco info given 04/18/16  Substance and Sexual Activity  . Alcohol use: Yes     Alcohol/week: 0.0 oz    Comment: occasionally- once every 2 weeks 1-2 beers  . Drug use: No  . Sexual activity: No    Birth control/protection: Surgical, Abstinence  Other Topics Concern  . None  Social History Narrative   Divorced, disabled   Lives with parents    Allergies:  Allergies  Allergen Reactions  . Penicillins Anaphylaxis  . Sulfonamide Derivatives Anaphylaxis    Metabolic Disorder Labs: Lab Results  Component Value Date   HGBA1C 5.9 05/01/2017   No results found for: PROLACTIN Lab Results  Component Value Date   CHOL 186 10/20/2016   TRIG 67.0 10/20/2016   HDL 88.10 10/20/2016   CHOLHDL 2 10/20/2016   VLDL 13.4 10/20/2016   LDLCALC 84 10/20/2016   LDLCALC 92 01/11/2016   Lab Results  Component Value Date   TSH 0.84 03/20/2017   TSH 1.38 10/20/2016    Therapeutic Level Labs: No results found for: LITHIUM No results found for: VALPROATE No components found for:  CBMZ  Current Medications: Current Outpatient Medications  Medication Sig Dispense Refill  . ACCU-CHEK AVIVA PLUS test strip USE AS DIRECTED FOUR TIMES DAILY (Patient taking differently: USE AS DIRECTED UP TO 8 TIMES DAILY) 350 each 2  . ACCU-CHEK SOFTCLIX LANCETS lancets Use as instructed to check blood sugar 4 times per day dx code E10.65 (Patient taking differently: Use as instructed to check blood sugar 8 times per day dx code E10.65) 400 each 1  . Alcohol Swabs (B-D SINGLE USE SWABS REGULAR) PADS Use to test blood sugar 4 times daily Dx code E10.65 200 each 2  . aspirin EC 81 MG tablet Take 81 mg by mouth every morning.    . B-D ULTRAFINE III SHORT PEN 31G X 8 MM MISC USE TO INJECT INSULIN DAILY. 100 each 0  . Blood Glucose Monitoring Suppl (ACCU-CHEK AVIVA PLUS) w/Device KIT USE AS DIRECTED 1 kit 0  . diclofenac sodium (VOLTAREN) 1 % GEL APPLY 2 GRAMS EXTERNALLY TO THE AFFECTED AREA FOUR TIMES DAILY 100 g 0  . gabapentin (NEURONTIN) 100 MG capsule TAKE 1 TO 2 CAPSULES(100 TO 200 MG) BY  MOUTH TWICE DAILY 120 capsule 0  . glucagon (GLUCAGON EMERGENCY) 1 MG injection INJECT 1 MG INTO THE SKIN AT ONCE AS NEEDED 2 kit 5  . guaiFENesin (ROBITUSSIN) 100 MG/5ML SOLN Take 10 mLs (200 mg total) by mouth every 4 (four) hours as needed for cough or to loosen phlegm. 1200 mL 6  . insulin aspart (  NOVOLOG) 100 UNIT/ML injection Sliding Scale 10 units in the am and 8 in the pm. 1-3 units as needed for high blood sugars. 10 mL 11  . insulin degludec (TRESIBA FLEXTOUCH) 100 UNIT/ML SOPN FlexTouch Pen Inject 0.15 mLs (15 Units total) into the skin daily. 15 pen 1  . lamoTRIgine (LAMICTAL) 100 MG tablet Take 1 tablet (100 mg total) by mouth 2 (two) times daily. 180 tablet 0  . lipase/protease/amylase (CREON) 36000 UNITS CPEP capsule Take 2 capsules (72,000 Units total) by mouth 3 (three) times daily before meals. 300 capsule 3  . loratadine (CLARITIN) 10 MG tablet Take 10 mg by mouth as needed for allergies.    Marland Kitchen lovastatin (MEVACOR) 40 MG tablet Take 1 tablet (40 mg total) by mouth at bedtime. 90 tablet 2  . Magnesium 500 MG TABS Take 1 tablet by mouth as needed.    . metoCLOPramide (REGLAN) 5 MG tablet TAKE 1 TABLET(5 MG) BY MOUTH THREE TIMES DAILY BEFORE MEALS 270 tablet 1  . Multiple Vitamin (MULTIVITAMIN) tablet Take 1 tablet by mouth daily. Reported on 11/22/2015    . omeprazole (PRILOSEC) 40 MG capsule Take 1 capsule (40 mg total) by mouth 2 (two) times daily. Annual appt due in Nov must see provider for future refills 60 capsule 2  . traMADol (ULTRAM) 50 MG tablet TAKE 1 TABLET BY MOUTH EVERY 12 HOURS AS NEEDED FOR MODERATE TO SEVERE PAIN. 60 tablet 0  . trazodone (DESYREL) 300 MG tablet Take 1 tablet (300 mg total) by mouth at bedtime. 90 tablet 0  . vitamin A 10000 UNIT capsule Take 10,000 Units by mouth daily.    . vitamin E (VITAMIN E) 1000 UNIT capsule Take 1,000 Units by mouth daily.       No current facility-administered medications for this visit.      Musculoskeletal: Strength &  Muscle Tone: within normal limits Gait & Station: normal Patient leans: N/A  Psychiatric Specialty Exam: Review of Systems  Constitutional: Positive for malaise/fatigue and weight loss. Negative for chills and fever.  HENT: Negative for congestion, nosebleeds, sinus pain and sore throat.     Blood pressure (!) 150/73, pulse 71, temperature 98 F (36.7 C), resp. rate 17, height 5' (1.524 m), weight 100 lb 12.8 oz (45.7 kg), SpO2 96 %.Body mass index is 19.69 kg/m.  General Appearance: Casual  Eye Contact:  Good  Speech:  Clear and Coherent and Normal Rate  Volume:  Normal  Mood:  Euthymic  Affect:  Full Range  Thought Process:  Goal Directed and Descriptions of Associations: Intact  Orientation:  Full (Time, Place, and Person)  Thought Content: Logical   Suicidal Thoughts:  No  Homicidal Thoughts:  No  Memory:  Immediate;   Good Recent;   Good Remote;   Good  Judgement:  Good  Insight:  Good  Psychomotor Activity:  Normal  Concentration:  Concentration: Good and Attention Span: Good  Recall:  Good  Fund of Knowledge: Good  Language: Good  Akathisia:  No  Handed:  Right  AIMS (if indicated): not done  Assets:  Communication Skills Desire for Improvement Housing Social Support Transportation  ADL's:  Intact  Cognition: WNL  Sleep:  Poor   Screenings: Mini-Mental     Office Visit from 05/30/2017 in [facility]  Total Score (max 30 points )  27    PHQ2-9     Office Visit from 05/30/2017 in [facility] Office Visit from 03/26/2017 in  [facility] Office Visit from 04/13/2014 in [facility] Visit from 02/28/2013 in [facility]  PHQ-2 Total Score  2  1  0  0  1  PHQ-9 Total Score  6  No data  No data  No data  No data       Assessment and Plan: MDD-recurrent, severe  without psychotic features; social anxiety disorder; insomnia; rule out bipolar disorder    Medication management with supportive therapy. Risks/benefits and SE of the medication discussed. Pt verbalized understanding and verbal consent obtained for treatment.  Affirm with the patient that the medications are taken as ordered. Patient expressed understanding of how their medications were to be used.   Meds: Lamictal 100 mg p.o. twice daily for depression. Trazodone 300 mg p.o. nightly for depression and insomnia   Labs: none  Therapy: brief supportive therapy provided. Discussed psychosocial stressors in detail.   Encouraged pt to develop daily routine and work on daily goal setting as a way to improve mood symptoms.    Consultations: Encouraged to follow up with PCP as needed  Pt denies SI and is at an acute low risk for suicide. Patient told to call clinic if any problems occur. Patient advised to go to ER if they should develop SI/HI, side effects, or if symptoms worsen. Has crisis numbers to call if needed. Pt verbalized understanding.  F/up in 3 months or sooner if needed    Charlcie Cradle, MD 06/07/2017, 11:43 AM

## 2017-06-15 ENCOUNTER — Other Ambulatory Visit: Payer: Self-pay | Admitting: Internal Medicine

## 2017-06-17 ENCOUNTER — Other Ambulatory Visit: Payer: Self-pay | Admitting: Endocrinology

## 2017-07-15 ENCOUNTER — Other Ambulatory Visit: Payer: Self-pay | Admitting: Internal Medicine

## 2017-07-18 ENCOUNTER — Other Ambulatory Visit: Payer: Self-pay | Admitting: Endocrinology

## 2017-07-19 ENCOUNTER — Ambulatory Visit: Payer: Self-pay | Admitting: Endocrinology

## 2017-07-19 ENCOUNTER — Other Ambulatory Visit: Payer: Self-pay

## 2017-07-19 MED ORDER — METOCLOPRAMIDE HCL 5 MG PO TABS: ORAL_TABLET | ORAL | 1 refills | Status: DC

## 2017-07-27 ENCOUNTER — Other Ambulatory Visit (INDEPENDENT_AMBULATORY_CARE_PROVIDER_SITE_OTHER): Payer: Medicare HMO

## 2017-07-27 DIAGNOSIS — E1065 Type 1 diabetes mellitus with hyperglycemia: Secondary | ICD-10-CM | POA: Diagnosis not present

## 2017-07-27 DIAGNOSIS — E7800 Pure hypercholesterolemia, unspecified: Secondary | ICD-10-CM

## 2017-07-27 LAB — COMPREHENSIVE METABOLIC PANEL
ALT: 15 U/L (ref 0–35)
AST: 21 U/L (ref 0–37)
Albumin: 4.1 g/dL (ref 3.5–5.2)
Alkaline Phosphatase: 46 U/L (ref 39–117)
BUN: 19 mg/dL (ref 6–23)
CHLORIDE: 99 meq/L (ref 96–112)
CO2: 34 meq/L — AB (ref 19–32)
Calcium: 9.2 mg/dL (ref 8.4–10.5)
Creatinine, Ser: 0.75 mg/dL (ref 0.40–1.20)
GFR: 82.88 mL/min (ref >60.00)
GLUCOSE: 173 mg/dL — AB (ref 70–99)
Potassium: 4.4 mEq/L (ref 3.5–5.1)
SODIUM: 138 meq/L (ref 135–145)
Total Bilirubin: 0.5 mg/dL (ref 0.2–1.2)
Total Protein: 6.6 g/dL (ref 6.0–8.3)

## 2017-07-27 LAB — LIPID PANEL
CHOL/HDL RATIO: 2
CHOLESTEROL: 159 mg/dL (ref 0–200)
HDL: 79.9 mg/dL (ref >39.00)
LDL CALC: 70 mg/dL (ref 0–99)
NonHDL: 79.16
Triglycerides: 48 mg/dL (ref 0.0–149.0)
VLDL: 9.6 mg/dL (ref 0.0–40.0)

## 2017-07-27 LAB — HEMOGLOBIN A1C: Hgb A1c MFr Bld: 6.1 % (ref 4.6–6.5)

## 2017-08-01 ENCOUNTER — Ambulatory Visit: Payer: Self-pay | Admitting: Endocrinology

## 2017-08-07 DIAGNOSIS — H2513 Age-related nuclear cataract, bilateral: Secondary | ICD-10-CM | POA: Diagnosis not present

## 2017-08-07 DIAGNOSIS — H02839 Dermatochalasis of unspecified eye, unspecified eyelid: Secondary | ICD-10-CM | POA: Diagnosis not present

## 2017-08-07 DIAGNOSIS — H2512 Age-related nuclear cataract, left eye: Secondary | ICD-10-CM | POA: Diagnosis not present

## 2017-08-07 DIAGNOSIS — H25043 Posterior subcapsular polar age-related cataract, bilateral: Secondary | ICD-10-CM | POA: Diagnosis not present

## 2017-08-07 DIAGNOSIS — H25013 Cortical age-related cataract, bilateral: Secondary | ICD-10-CM | POA: Diagnosis not present

## 2017-08-08 ENCOUNTER — Encounter: Payer: Self-pay | Admitting: Endocrinology

## 2017-08-08 ENCOUNTER — Ambulatory Visit: Payer: Medicare HMO | Admitting: Endocrinology

## 2017-08-08 VITALS — BP 124/68 | HR 66 | Ht 64.0 in | Wt 100.6 lb

## 2017-08-08 DIAGNOSIS — E1065 Type 1 diabetes mellitus with hyperglycemia: Secondary | ICD-10-CM | POA: Diagnosis not present

## 2017-08-08 DIAGNOSIS — E1042 Type 1 diabetes mellitus with diabetic polyneuropathy: Secondary | ICD-10-CM

## 2017-08-08 NOTE — Progress Notes (Signed)
Patient ID: Leslie Lane, female   DOB: 10-29-1955, 62 y.o.   MRN: [id number]   Reason for Appointment: Diabetes follow-up   History of Present Illness    Diagnosis: Type 1 DIABETES MELITUS, diagnosed 1967      She has had labile blood sugar control over the last several years even though A1c has been usually around 7% She has had less lability and hypoglycemia with taking b.i.d. Lantus compared once a day She has been very sensitive to fast acting insulin and frequently does not require mealtime coverage She cannot tolerate Toujeo because of reported episode of headache, bloating and nausea with the first dose  RECENT history:  Insulin regimen: Tresiba 7 units daily, Humalog 1-2 units up to 4 times a day   Her last A1c was 6.5  In July and is now 5.9  Current blood sugar patterns and problems identified:  She  has not been able to get the DexCom sensor and she was told by her insurance that this is available only if she is taking ?  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 and sometimes her mother will recognize her symptoms  Blood sugars have been as low as 33  However she is more likely getting high sugars fasting which she thinks is from eating too much at night and usually high carbohydrate snacks including popcorn  She does not as discussed before have a snack when she is more active such as shopping  She will take Humalog when she is having high sugars are eating large portions but not always taking this late at night when she is not focusing on checking her sugar also    HYPOGLYCEMIA: As above, may not have symptoms with low blood sugars and does not have early warning symptoms. Gets confused frequently and she depends on her mother to recognize low  sugars and treat them. Her mother knows how to give Glucagon injection She will treat her low blood sugars usually with juice, does not like glucose tablets; sometimes will eat food at the same time      DIET: She is trying to get protein with eggs at breakfast Meal times: Variable.Marland Kitchen  She will eat more when she is anxious  Monitors blood glucose:  about 4-5 times a day.    Glucometer:   Accu-Chek Aviva   Blood Glucose readings from meter download as follows; recently checking blood sugar 4.6 times a day   Mean values apply above for all meters except median for One Touch  PRE-MEAL Fasting Lunch Dinner  overnight Overall  Glucose range:  87-300      Mean/median:  180    105  129+/-58   POST-MEAL PC Breakfast PC Lunch PC Dinner  Glucose range:   33-275   Mean/median:   121  125   Previous blood sugar results as follows:  Mean values apply above for all meters except median for One Touch  PRE-MEAL Fasting Lunch Dinner Overnight  Overall  Glucose range:  54-274     43-244  39-274   Mean/median: 155    121  133   POST-MEAL PC Breakfast PC Lunch PC Dinner  Glucose range:    48-224   Mean/median:         Wt Readings  from Last 3 Encounters:  08/08/17 100 lb 9.6 oz (45.6 kg)  05/30/17 99 lb (44.9 kg)  05/03/17 101 lb (45.8 kg)    Lab Results  Component Value Date   HGBA1C 6.1 07/27/2017   HGBA1C 5.9 05/01/2017   HGBA1C 6.5 01/12/2017   Lab Results  Component Value Date   MICROALBUR 3.8 (H) 10/20/2016   LDLCALC 70 07/27/2017   CREATININE 0.75 07/27/2017        Allergies as of 08/08/2017      Reactions   Penicillins Anaphylaxis   Sulfonamide Derivatives Anaphylaxis      Medication List        Accurate as of 08/08/17  1:56 PM. Always use your most recent med list.          ACCU-CHEK AVIVA PLUS test strip Generic drug:  glucose blood USE AS DIRECTED FOUR TIMES DAILY   ACCU-CHEK AVIVA PLUS w/Device Kit USE AS DIRECTED   ACCU-CHEK AVIVA Soln USE AS  DIRECTED   ACCU-CHEK SOFTCLIX LANCETS lancets Use as instructed to check blood sugar 4 times per day dx code E10.65   aspirin EC 81 MG tablet Take 81 mg by mouth every morning.   B-D SINGLE USE SWABS REGULAR Pads Use to test blood sugar 4 times daily Dx code E10.65   B-D ULTRAFINE III SHORT PEN 31G X 8 MM Misc Generic drug:  Insulin Pen Needle USE TO INJECT INSULIN DAILY.   diclofenac sodium 1 % Gel Commonly known as:  VOLTAREN APPLY 2 GRAMS EXTERNALLY TO THE AFFECTED AREA FOUR TIMES DAILY   gabapentin 100 MG capsule Commonly known as:  NEURONTIN TAKE 1 TO 2 CAPSULES(100 TO 200 MG) BY MOUTH TWICE DAILY   glucagon 1 MG injection Commonly known as:  GLUCAGON EMERGENCY INJECT 1 MG INTO THE SKIN AT ONCE AS NEEDED   guaiFENesin 100 MG/5ML Soln Commonly known as:  ROBITUSSIN Take 10 mLs (200 mg total) by mouth every 4 (four) hours as needed for cough or to loosen phlegm.   insulin aspart 100 UNIT/ML injection Commonly known as:  NOVOLOG Sliding Scale 10 units in the am and 8 in the pm. 1-3 units as needed for high blood sugars.   insulin degludec 100 UNIT/ML Sopn FlexTouch Pen Commonly known as:  TRESIBA FLEXTOUCH Inject 0.15 mLs (15 Units total) into the skin daily.   lamoTRIgine 100 MG tablet Commonly known as:  LAMICTAL Take 1 tablet (100 mg total) by mouth 2 (two) times daily.   lipase/protease/amylase 36000 UNITS Cpep capsule Commonly known as:  CREON Take 2 capsules (72,000 Units total) by mouth 3 (three) times daily before meals.   loratadine 10 MG tablet Commonly known as:  CLARITIN Take 10 mg by mouth as needed for allergies.   lovastatin 40 MG tablet Commonly known as:  MEVACOR Take 1 tablet (40 mg total) by mouth at bedtime.   Magnesium 500 MG Tabs Take 1 tablet by mouth as needed.   metoCLOPramide 5 MG tablet Commonly known as:  REGLAN TAKE 1 TABLET(5 MG) BY MOUTH THREE TIMES DAILY BEFORE MEALS   metoCLOPramide 5 MG tablet Commonly known as:   REGLAN TAKE 1 TABLET(5 MG) BY MOUTH THREE TIMES DAILY BEFORE MEALS   multivitamin tablet Take 1 tablet by mouth daily. Reported on 11/22/2015   omeprazole 40 MG capsule Commonly known as:  PRILOSEC Take 1 capsule (40 mg total) by mouth 2 (two) times daily. Annual appt due in Nov must see provider for future refills   traMADol  50 MG tablet Commonly known as:  ULTRAM TAKE 1 TABLET BY MOUTH EVERY 12 HOURS AS NEEDED FOR MODERATE TO SEVERE PAIN.   trazodone 300 MG tablet Commonly known as:  DESYREL Take 1 tablet (300 mg total) by mouth at bedtime.   vitamin A 10000 UNIT capsule Take 10,000 Units by mouth daily.   vitamin E 1000 UNIT capsule Generic drug:  vitamin E Take 1,000 Units by mouth daily.       Allergies:  Allergies  Allergen Reactions  . Penicillins Anaphylaxis  . Sulfonamide Derivatives Anaphylaxis    Past Medical History:  Diagnosis Date  . ALLERGIC RHINITIS   . Allergy   . Anemia   . Anxiety   . Arthritis   . BIPOLAR AFFECTIVE DISORDER   . Cataract   . DEPRESSION   . Diabetes mellitus type I ([place])   . DIABETES MELLITUS, TYPE II    follows with endo  . Fibromyalgia   . GERD   . HEPATITIS B   . HYPERLIPIDEMIA   . LOW BACK PAIN, CHRONIC   . MIGRAINE HEADACHE   . NARCOTIC ABUSE    hx of  . NECK PAIN, CHRONIC   . Neuropathy   . Seizures ([place])    pt states, "if my blood sugar drops to the 20's, I convulse.  It hasn't happened in a long time."  . SMOKER     Past Surgical History:  Procedure Laterality Date  . CESAREAN SECTION    . COLONOSCOPY    . TUBAL LIGATION    . UPPER GASTROINTESTINAL ENDOSCOPY      Family History  Problem Relation Age of Onset  . Arthritis Mother   . Colon cancer Mother        ? age of dx  . Arthritis Father   . Kidney disease Father   . Kidney cancer Father   . Heart attack Brother   . Bladder Cancer Sister   . Hyperlipidemia Other   . Kidney cancer Paternal Aunt   . Anxiety disorder Neg Hx   . Bipolar  disorder Neg Hx   . Depression Neg Hx   . Breast cancer Neg Hx   . Esophageal cancer Neg Hx   . Stomach cancer Neg Hx   . Rectal cancer Neg Hx     Social History:  reports that she has been smoking cigarettes.  She has a 60.00 pack-year smoking history. she has never used smokeless tobacco. She reports that she drinks alcohol. She reports that she does not use drugs.  Review of Systems:   WEIGHT loss: Now leveled off, etiology not found    Wt Readings from Last 3 Encounters:  08/08/17 100 lb 9.6 oz (45.6 kg)  05/30/17 99 lb (44.9 kg)  05/03/17 101 lb (45.8 kg)    Lab Results  Component Value Date   TSH 0.84 03/20/2017    Has probable gastroparesis treated with Reglan, she finds this useful  Her diarrhea has leveled off, etiology not found   NEUROPATHY: She has burning in her legs and feet at times  Consistently taking her gabapentin     DEPRESSION: She has had long-standing depression and anxiety with variable level of control and is currently on Lamictal   HYPERLIPIDEMIA: The lipid abnormality consists of elevated LDL   Is  on lovastatin 40 mg with  good control   Lab Results  Component Value Date   CHOL 159 07/27/2017   HDL 79.90 07/27/2017   LDLCALC 70 07/27/2017  LDLDIRECT 101.7 03/26/2012   TRIG 48.0 07/27/2017   CHOLHDL 2 07/27/2017     Has history of small goiter but euthyroid  Lab Results  Component Value Date   TSH 0.84 03/20/2017       Examination:   BP 124/68 (BP Location: Left Arm, Patient Position: Sitting, Cuff Size: Normal)   Pulse 66   Ht 5' 4"  (1.626 m)   Wt 100 lb 9.6 oz (45.6 kg)   SpO2 96%   BMI 17.27 kg/m   Body mass index is 17.27 kg/m.    ASSESSMENT/ PLAN:   Diabetes type 1 See history of present illness for detailed discussion of current insulin, blood sugar patterns, problems identified  A1c is again excellent at 6.1 She is currently taking 7 units Tresiba once a day and also taking MEALTIME insulin up to to  4 times a day based on her type of intake and also for correction for high readings  Because of her severe hypoglycemia episodes, hypoglycemia unawareness and her diagnosis she is requiring a continuous glucose monitoring sensor and this should be approved by her insurance, will need to have her reapply  Meanwhile she will reduce her Tresiba by 1 unit, probably requiring less insulin because of her weight loss  She does however need to take her Humalog more consistently late at night when she is overeating with more carbohydrate snacks and this will help her fasting hyperglycemia also reminded her to  Proactively she needs to prevent hypoglycemia with more snacks when she is trying to be active or potentially late for meals  She may be also a candidate for insulin pump but likely will be difficult to train her    Patient Instructions  6 units Tresiba   Counseling time on subjects discussed in assessment and plan sections is over 50% of today's 25 minute visit   Leslie Lane 08/08/2017, 1:56 PM

## 2017-08-08 NOTE — Patient Instructions (Addendum)
6 units Tresiba  Take Humalog for large snacks at [employer] before walking

## 2017-09-05 ENCOUNTER — Telehealth: Payer: Self-pay | Admitting: Endocrinology

## 2017-09-06 ENCOUNTER — Encounter (HOSPITAL_COMMUNITY): Payer: Self-pay | Admitting: Psychiatry

## 2017-09-06 ENCOUNTER — Ambulatory Visit (HOSPITAL_COMMUNITY): Payer: Medicare HMO | Admitting: Psychiatry

## 2017-09-06 VITALS — BP 120/64 | HR 77 | Ht 63.5 in | Wt 100.0 lb

## 2017-09-06 DIAGNOSIS — Z736 Limitation of activities due to disability: Secondary | ICD-10-CM

## 2017-09-06 DIAGNOSIS — F4010 Social phobia, unspecified: Secondary | ICD-10-CM | POA: Diagnosis not present

## 2017-09-06 DIAGNOSIS — G4701 Insomnia due to medical condition: Secondary | ICD-10-CM | POA: Diagnosis not present

## 2017-09-06 DIAGNOSIS — R0981 Nasal congestion: Secondary | ICD-10-CM

## 2017-09-06 DIAGNOSIS — F332 Major depressive disorder, recurrent severe without psychotic features: Secondary | ICD-10-CM

## 2017-09-06 DIAGNOSIS — F17210 Nicotine dependence, cigarettes, uncomplicated: Secondary | ICD-10-CM

## 2017-09-06 MED ORDER — TRAZODONE HCL 300 MG PO TABS: 300.0000 mg | ORAL_TABLET | Freq: Every day | ORAL | 0 refills | Status: DC

## 2017-09-06 MED ORDER — LAMOTRIGINE 100 MG PO TABS: 100.0000 mg | ORAL_TABLET | Freq: Two times a day (BID) | ORAL | 0 refills | Status: DC

## 2017-09-06 NOTE — Progress Notes (Signed)
BH MD/PA/NP OP Progress Note  09/06/2017 11:03 AM Leslie Lane  MRN:  [id number]  Chief Complaint:  Chief Complaint    Depression; Follow-up     HPI: "I don't want to do anything". Her father went into the hospital last Monday. He has since been discharged she has been caring for him along with her mother. It is depressing and making her worry and now she doesn't want to do anything or go anywhere. Pt is depressed everyday and is very fatigued. She has occasional crying spells when she thinks about losing her father. She is having a sinus infection. Pt is engaging in ADL's. Sleep is good with Trazodone. Pt denies SI/HI.   Pt isolates and doesn't go out to see her friends but talks to them on the phone. Pt is now staying with her parents so she has some interaction. Pt doesn't like being by herself and that causes anxiety. Pt only has anxiety if she goes out around other people.  Pt states-taking meds as prescribed and denies SE. Pt does not want meds changed today.  Visit Diagnosis:    ICD-10-CM   1. Major depressive disorder, recurrent, severe without psychotic features ([place]) F33.2 trazodone (DESYREL) 300 MG tablet    lamoTRIgine (LAMICTAL) 100 MG tablet  2. Insomnia due to medical condition G47.01 trazodone (DESYREL) 300 MG tablet  3. Social anxiety disorder F40.10 lamoTRIgine (LAMICTAL) 100 MG tablet      Past Psychiatric History:  Anxiety: Yes Bipolar Disorder: No Depression: Yes Mania: No Psychosis: No Schizophrenia: No Personality Disorder: No  Hospitalization for psychiatric illness: No History of Electroconvulsive Shock Therapy: No Prior Suicide Attempts: Yes  Seroquel, Trazodone-ineffective   Lexapro   Depakote   Effexor and Cymbalta- ineffective   Wellbutrin- ineffective   Abilify- ineffective   Paxil-effective but not the second time it was prescribed          Past Medical History:  Past Medical History:  Diagnosis Date  . ALLERGIC RHINITIS   . Allergy    . Anemia   . Anxiety   . Arthritis   . BIPOLAR AFFECTIVE DISORDER   . Cataract   . DEPRESSION   . Diabetes mellitus type I ([place])   . DIABETES MELLITUS, TYPE II    follows with endo  . Fibromyalgia   . GERD   . HEPATITIS B   . HYPERLIPIDEMIA   . LOW BACK PAIN, CHRONIC   . MIGRAINE HEADACHE   . NARCOTIC ABUSE    hx of  . NECK PAIN, CHRONIC   . Neuropathy   . Seizures ([place])    pt states, "if my blood sugar drops to the 20's, I convulse.  It hasn't happened in a long time."  . SMOKER     Past Surgical History:  Procedure Laterality Date  . CESAREAN SECTION    . COLONOSCOPY    . TUBAL LIGATION    . UPPER GASTROINTESTINAL ENDOSCOPY      Family Psychiatric History:  Family History  Problem Relation Age of Onset  . Arthritis Mother   . Colon cancer Mother        ? age of dx  . Arthritis Father   . Kidney disease Father   . Kidney cancer Father   . Heart attack Brother   . Bladder Cancer Sister   . Hyperlipidemia Other   . Kidney cancer Paternal Aunt   . Anxiety disorder Neg Hx   . Bipolar disorder Neg Hx   . Depression  Neg Hx   . Breast cancer Neg Hx   . Esophageal cancer Neg Hx   . Stomach cancer Neg Hx   . Rectal cancer Neg Hx     Social History:  Social History   Socioeconomic History  . Marital status: Divorced    Spouse name: None  . Number of children: 1  . Years of education: None  . Highest education level: None  Social Needs  . Financial resource strain: None  . Food insecurity - worry: None  . Food insecurity - inability: None  . Transportation needs - medical: None  . Transportation needs - non-medical: None  Occupational History  . Occupation: diabled  Tobacco Use  . Smoking status: Current Every Day Smoker    Packs/day: 1.50    Years: 40.00    Pack years: 60.00    Types: Cigarettes  . Smokeless tobacco: Never Used  . Tobacco comment: tobacco info given 04/18/16  Substance and Sexual Activity  . Alcohol use: Yes    Alcohol/week:  0.0 oz    Comment: occasionally- once every 2 weeks 1-2 beers  . Drug use: No  . Sexual activity: No    Birth control/protection: Surgical, Abstinence  Other Topics Concern  . None  Social History Narrative   Divorced, disabled   Lives with parents    Allergies:  Allergies  Allergen Reactions  . Penicillins Anaphylaxis  . Sulfonamide Derivatives Anaphylaxis    Metabolic Disorder Labs: Lab Results  Component Value Date   HGBA1C 6.1 07/27/2017   No results found for: PROLACTIN Lab Results  Component Value Date   CHOL 159 07/27/2017   TRIG 48.0 07/27/2017   HDL 79.90 07/27/2017   CHOLHDL 2 07/27/2017   VLDL 9.6 07/27/2017   LDLCALC 70 07/27/2017   LDLCALC 84 10/20/2016   Lab Results  Component Value Date   TSH 0.84 03/20/2017   TSH 1.38 10/20/2016    Therapeutic Level Labs: No results found for: LITHIUM No results found for: VALPROATE No components found for:  CBMZ  Current Medications: Current Outpatient Medications  Medication Sig Dispense Refill  . ACCU-CHEK AVIVA PLUS test strip USE AS DIRECTED FOUR TIMES DAILY (Patient taking differently: USE AS DIRECTED UP TO 8 TIMES DAILY) 350 each 2  . ACCU-CHEK SOFTCLIX LANCETS lancets Use as instructed to check blood sugar 4 times per day dx code E10.65 (Patient taking differently: Use as instructed to check blood sugar 8 times per day dx code E10.65) 400 each 1  . Alcohol Swabs (B-D SINGLE USE SWABS REGULAR) PADS Use to test blood sugar 4 times daily Dx code E10.65 200 each 2  . aspirin EC 81 MG tablet Take 81 mg by mouth every morning.    . B-D ULTRAFINE III SHORT PEN 31G X 8 MM MISC USE TO INJECT INSULIN DAILY. 100 each 0  . Blood Glucose Calibration (ACCU-CHEK AVIVA) SOLN USE AS DIRECTED 1 each 1  . Blood Glucose Monitoring Suppl (ACCU-CHEK AVIVA PLUS) w/Device KIT USE AS DIRECTED 1 kit 0  . diclofenac sodium (VOLTAREN) 1 % GEL APPLY 2 GRAMS EXTERNALLY TO THE AFFECTED AREA FOUR TIMES DAILY 100 g 0  . gabapentin  (NEURONTIN) 100 MG capsule TAKE 1 TO 2 CAPSULES(100 TO 200 MG) BY MOUTH TWICE DAILY 120 capsule 3  . glucagon (GLUCAGON EMERGENCY) 1 MG injection INJECT 1 MG INTO THE SKIN AT ONCE AS NEEDED 2 kit 5  . guaiFENesin (ROBITUSSIN) 100 MG/5ML SOLN Take 10 mLs (200 mg total) by mouth  every 4 (four) hours as needed for cough or to loosen phlegm. 1200 mL 6  . insulin aspart (NOVOLOG) 100 UNIT/ML injection Sliding Scale 10 units in the am and 8 in the pm. 1-3 units as needed for high blood sugars. 10 mL 11  . insulin degludec (TRESIBA FLEXTOUCH) 100 UNIT/ML SOPN FlexTouch Pen Inject 0.15 mLs (15 Units total) into the skin daily. 15 pen 1  . lamoTRIgine (LAMICTAL) 100 MG tablet Take 1 tablet (100 mg total) by mouth 2 (two) times daily. 180 tablet 0  . lipase/protease/amylase (CREON) 36000 UNITS CPEP capsule Take 2 capsules (72,000 Units total) by mouth 3 (three) times daily before meals. 300 capsule 3  . loratadine (CLARITIN) 10 MG tablet Take 10 mg by mouth as needed for allergies.    Marland Kitchen lovastatin (MEVACOR) 40 MG tablet Take 1 tablet (40 mg total) by mouth at bedtime. 90 tablet 2  . Magnesium 500 MG TABS Take 1 tablet by mouth as needed.    . metoCLOPramide (REGLAN) 5 MG tablet TAKE 1 TABLET(5 MG) BY MOUTH THREE TIMES DAILY BEFORE MEALS 90 tablet 0  . metoCLOPramide (REGLAN) 5 MG tablet TAKE 1 TABLET(5 MG) BY MOUTH THREE TIMES DAILY BEFORE MEALS 270 tablet 1  . Multiple Vitamin (MULTIVITAMIN) tablet Take 1 tablet by mouth daily. Reported on 11/22/2015    . omeprazole (PRILOSEC) 40 MG capsule Take 1 capsule (40 mg total) by mouth 2 (two) times daily. Annual appt due in Nov must see provider for future refills 60 capsule 2  . traMADol (ULTRAM) 50 MG tablet TAKE 1 TABLET BY MOUTH EVERY 12 HOURS AS NEEDED FOR MODERATE TO SEVERE PAIN. 60 tablet 0  . trazodone (DESYREL) 300 MG tablet Take 1 tablet (300 mg total) by mouth at bedtime. 90 tablet 0  . vitamin A 10000 UNIT capsule Take 10,000 Units by mouth daily.    .  vitamin E (VITAMIN E) 1000 UNIT capsule Take 1,000 Units by mouth daily.       No current facility-administered medications for this visit.      Musculoskeletal: Strength & Muscle Tone: within normal limits Gait & Station: normal Patient leans: N/A  Psychiatric Specialty Exam: Review of Systems  Constitutional: Negative for chills and fever.  HENT: Positive for congestion and sinus pain. Negative for ear pain and sore throat.   Neurological: Negative for weakness.    Blood pressure 120/64, pulse 77, height 5' 3.5" (1.613 m), weight 100 lb (45.4 kg), SpO2 95 %.Body mass index is 17.44 kg/m.  General Appearance: Casual  Eye Contact:  Good  Speech:  Clear and Coherent and Normal Rate  Volume:  Normal  Mood:  Depressed  Affect:  Full Range  Thought Process:  Goal Directed and Descriptions of Associations: Intact  Orientation:  Full (Time, Place, and Person)  Thought Content: Rumination   Suicidal Thoughts:  No  Homicidal Thoughts:  No  Memory:  Immediate;   Good Recent;   Good Remote;   Good  Judgement:  Good  Insight:  Good  Psychomotor Activity:  Normal  Concentration:  Concentration: Good and Attention Span: Good  Recall:  Good  Fund of Knowledge: Good  Language: Good  Akathisia:  No  Handed:  Right  AIMS (if indicated): not done  Assets:  Communication Skills Desire for Improvement Housing Social Support  ADL's:  Intact  Cognition: WNL  Sleep:  Good   Screenings: Mini-Mental     Office Visit from 05/30/2017 in [facility]  Total Score (max 30 points )  27    PHQ2-9     Office Visit from 05/30/2017 in [facility] Office Visit from 03/26/2017 in [facility] Office Visit from 04/13/2014 in [facility] from 02/28/2013 in [facility]  PHQ-2 Total Score  2   1  0  0  1  PHQ-9 Total Score  6  No data  No data  No data  No data       Assessment and Plan: MDD-recurrent, moderate without psychotic features; social anxiety disorder; insomnia; rule out bipolar disorder    Medication management with supportive therapy. Risks and benefits, side effects and alternative treatment options discussed with patient. Pt was given an opportunity to ask questions about medication, illness, and treatment. All current psychiatric medications have been reviewed and discussed with the patient and adjusted as clinically appropriate. The patient has been provided an accurate and updated list of the medications being now prescribed. Patient expressed understanding of how their medications were to be used.  Pt verbalized understanding and verbal consent obtained for treatment.  Status of current problems: worsening depression  Meds: Lamictal 100 mg p.o. twice daily for MDD Trazodone 300 mg p.o. nightly for MDD and insomnia  Labs: none  Therapy: brief supportive therapy provided. Discussed psychosocial stressors in detail.     Consultations: none  Pt denies SI and is at an acute low risk for suicide. Patient told to call clinic if any problems occur. Patient advised to go to ER if they should develop SI/HI, side effects, or if symptoms worsen. Has crisis numbers to call if needed. Pt verbalized understanding.  F/up in 2 months or sooner if needed    Charlcie Cradle, MD 09/06/2017, 11:03 AM

## 2017-09-12 NOTE — Telephone Encounter (Signed)
error 

## 2017-09-14 DIAGNOSIS — H2513 Age-related nuclear cataract, bilateral: Secondary | ICD-10-CM | POA: Diagnosis not present

## 2017-09-14 DIAGNOSIS — H2511 Age-related nuclear cataract, right eye: Secondary | ICD-10-CM | POA: Diagnosis not present

## 2017-09-14 DIAGNOSIS — H2512 Age-related nuclear cataract, left eye: Secondary | ICD-10-CM | POA: Diagnosis not present

## 2017-09-18 DIAGNOSIS — E1065 Type 1 diabetes mellitus with hyperglycemia: Secondary | ICD-10-CM | POA: Diagnosis not present

## 2017-09-25 ENCOUNTER — Other Ambulatory Visit: Payer: Self-pay | Admitting: Endocrinology

## 2017-09-25 ENCOUNTER — Encounter: Payer: Medicare HMO | Attending: Endocrinology | Admitting: Nutrition

## 2017-09-25 DIAGNOSIS — K3184 Gastroparesis: Secondary | ICD-10-CM

## 2017-09-25 DIAGNOSIS — E1065 Type 1 diabetes mellitus with hyperglycemia: Secondary | ICD-10-CM | POA: Insufficient documentation

## 2017-09-25 DIAGNOSIS — E1043 Type 1 diabetes mellitus with diabetic autonomic (poly)neuropathy: Secondary | ICD-10-CM

## 2017-09-25 DIAGNOSIS — Z713 Dietary counseling and surveillance: Secondary | ICD-10-CM | POA: Diagnosis not present

## 2017-09-25 NOTE — Progress Notes (Signed)
Leslie Lane was trainined on how to insert the sensor and transmitter, how to calibrate the sensor and how to remove the sensor when it ends.  We set up her alerts at Lows: less than 80, high: 250.  Written instructs given for how to insert the sensor, and how /when to calibrate the sensor.  She reported good understanding of this, but admits that her memory is "not good".   A sensor was inserted into her upper outer left abdomen, with some assistance from me.  We also discussed the difference between sensor readings and glucose readings, and what the arrows meal.  She reported good understanding of this with no final questions

## 2017-09-25 NOTE — Patient Instructions (Signed)
Calibrate the sensor in 2 hours, then calibrate every 12 hours.  Once when you wake up, and once when you go to bed.   Call if questions.

## 2017-09-28 DIAGNOSIS — Z961 Presence of intraocular lens: Secondary | ICD-10-CM | POA: Diagnosis not present

## 2017-09-28 DIAGNOSIS — H5203 Hypermetropia, bilateral: Secondary | ICD-10-CM | POA: Diagnosis not present

## 2017-09-28 DIAGNOSIS — H52223 Regular astigmatism, bilateral: Secondary | ICD-10-CM | POA: Diagnosis not present

## 2017-09-28 DIAGNOSIS — H524 Presbyopia: Secondary | ICD-10-CM | POA: Diagnosis not present

## 2017-09-28 DIAGNOSIS — H2511 Age-related nuclear cataract, right eye: Secondary | ICD-10-CM | POA: Diagnosis not present

## 2017-10-02 ENCOUNTER — Other Ambulatory Visit: Payer: Self-pay | Admitting: Endocrinology

## 2017-10-03 ENCOUNTER — Other Ambulatory Visit: Payer: Self-pay

## 2017-10-05 ENCOUNTER — Ambulatory Visit: Payer: Self-pay | Admitting: Endocrinology

## 2017-10-11 ENCOUNTER — Other Ambulatory Visit (INDEPENDENT_AMBULATORY_CARE_PROVIDER_SITE_OTHER): Payer: Medicare HMO

## 2017-10-11 DIAGNOSIS — E1065 Type 1 diabetes mellitus with hyperglycemia: Secondary | ICD-10-CM | POA: Diagnosis not present

## 2017-10-11 LAB — GLUCOSE, RANDOM: Glucose, Bld: 188 mg/dL — ABNORMAL HIGH (ref 70–99)

## 2017-10-12 LAB — FRUCTOSAMINE: Fructosamine: 322 umol/L — ABNORMAL HIGH (ref 0–285)

## 2017-10-17 ENCOUNTER — Ambulatory Visit: Payer: Medicare HMO | Admitting: Endocrinology

## 2017-10-17 ENCOUNTER — Encounter: Payer: Self-pay | Admitting: Endocrinology

## 2017-10-17 VITALS — BP 130/66 | HR 77 | Ht 63.5 in | Wt 104.0 lb

## 2017-10-17 DIAGNOSIS — E1065 Type 1 diabetes mellitus with hyperglycemia: Secondary | ICD-10-CM

## 2017-10-17 NOTE — Progress Notes (Signed)
Patient ID: Leslie Lane, female   DOB: July 06, 1955, 62 y.o.   MRN: [id number]   Reason for Appointment: Diabetes follow-up   History of Present Illness    Diagnosis: Type 1 DIABETES MELITUS, diagnosed 1967      She has had labile blood sugar control over the last several years even though A1c has been usually around 7% She has had less lability and hypoglycemia with taking b.i.d. Lantus compared once a day She has been very sensitive to fast acting insulin and frequently does not require mealtime coverage She cannot tolerate Toujeo because of reported episode of headache, bloating and nausea with the first dose  RECENT history:  Insulin regimen: Tresiba 7 units daily, Humalog 1-2 units up to 4 times a day   Her last A1c was 6.1 but her fructosamine is now 329   Current blood sugar patterns and problems identified:  She  has been able to get the DexCom sensor but she has had a functional only for about a week or so until 2 weeks ago  Although when she was wearing the sensor she was having much less hypoglycemia and mostly after supper time she was also having high readings overnight more consistently from poor diet  She now says that she is taking Humalog when she is eating any bread at suppertime regardless of blood sugar and this may have been causing blood sugars  Also she said that she has been much more active with helping her family and is getting low blood sugars almost daily recently with blood sugars as low as 32  She is also over aggressive with taking Humalog anytime blood sugar is over 200 which may be causing some low sugars also  FASTING blood sugars recently are not low, recently lowest 75 but also as high as 400  Some of her morning highs may be related to either poor diet during the night or rebound from low sugar in the evening before  She is still not trying to remain low blood sugars with taking an extra snack prior to being more active  As  before we will have occasional high sugars in the afternoon and late evening based on her diet  AVERAGE blood sugar on her Accu-Chek meter is only 121 standard deviation 78  HYPOGLYCEMIA: As above, may not have symptoms with low blood sugars and does not have early warning symptoms. Gets confused frequently and she depends on her mother to recognize low sugars and treat them. Her mother knows how to give Glucagon injection She will treat her low blood sugars usually with juice, does not like glucose tablets; sometimes will eat food at the same time      DIET: She is trying to get protein with eggs at breakfast Meal times: Variable.Marland Kitchen  She will eat more when she is anxious  Monitors blood glucose:  about 4-5 times a day.    Glucometer:   Accu-Chek Aviva   Interpretation of continuous glucose monitoring for 1 week through 10/02/17  CGM use % of time  75  Average and SD  138+/-66  Time in range     64   %  % Time Above 180  24.6  % Time above 250  8.5  % Time Below target  11.4, below 54 =3.9   CGM patterns:  HYPOGLYCEMIA occurring mostly between 6-8 PM and once at about noon and this occurred on at least 3 or 4 days out of the recording available HYPERGLYCEMIA:  Patterns of hypoglycemia were seen during the night with blood sugars rising early in the morning until about 9-10 AM Also had hyperglycemia more persistently until early in the afternoon on 1 of the days Blood sugar patterns were relatively consistent on the 7 days that were available for review No significant postprandial hyperglycemia seen especially in the evening and only some mild increase in blood sugars seen after lunch in the early afternoon  ACCU-CHEK readings:   checking blood sugar 4-6 times a day Highest average blood sugar 180 in the morning and lowest 84 between 6-9 PM   Wt Readings from Last 3 Encounters:  10/17/17 104 lb (47.2 kg)  08/08/17 100 lb 9.6 oz (45.6 kg)  05/30/17 99 lb (44.9 kg)    Lab Results    Component Value Date   HGBA1C 6.1 07/27/2017   HGBA1C 5.9 05/01/2017   HGBA1C 6.5 01/12/2017   Lab Results  Component Value Date   MICROALBUR 3.8 (H) 10/20/2016   LDLCALC 70 07/27/2017   CREATININE 0.75 07/27/2017        Allergies as of 10/17/2017      Reactions   Penicillins Anaphylaxis   Sulfonamide Derivatives Anaphylaxis      Medication List        Accurate as of 10/17/17  3:57 PM. Always use your most recent med list.          ACCU-CHEK AVIVA PLUS test strip Generic drug:  glucose blood TEST BLOOD SUGAR FOUR TIMES DAILY   ACCU-CHEK AVIVA PLUS w/Device Kit USE AS DIRECTED   ACCU-CHEK AVIVA Soln USE AS DIRECTED   ACCU-CHEK SOFTCLIX LANCETS lancets Use as instructed to check blood sugar 4 times per day dx code E10.65   aspirin EC 81 MG tablet Take 81 mg by mouth every morning.   B-D SINGLE USE SWABS REGULAR Pads USE FOUR TIMES DAILY   B-D ULTRAFINE III SHORT PEN 31G X 8 MM Misc Generic drug:  Insulin Pen Needle USE TO INJECT INSULIN DAILY.   diclofenac sodium 1 % Gel Commonly known as:  VOLTAREN APPLY 2 GRAMS EXTERNALLY TO THE AFFECTED AREA FOUR TIMES DAILY   gabapentin 100 MG capsule Commonly known as:  NEURONTIN TAKE 1 TO 2 CAPSULES(100 TO 200 MG) BY MOUTH TWICE DAILY   glucagon 1 MG injection Commonly known as:  GLUCAGON EMERGENCY INJECT 1 MG INTO THE SKIN AT ONCE AS NEEDED   guaiFENesin 100 MG/5ML Soln Commonly known as:  ROBITUSSIN Take 10 mLs (200 mg total) by mouth every 4 (four) hours as needed for cough or to loosen phlegm.   insulin aspart 100 UNIT/ML injection Commonly known as:  NOVOLOG Sliding Scale 10 units in the am and 8 in the pm. 1-3 units as needed for high blood sugars.   insulin degludec 100 UNIT/ML Sopn FlexTouch Pen Commonly known as:  TRESIBA FLEXTOUCH Inject 0.15 mLs (15 Units total) into the skin daily.   lamoTRIgine 100 MG tablet Commonly known as:  LAMICTAL Take 1 tablet (100 mg total) by mouth 2 (two) times  daily.   lipase/protease/amylase 36000 UNITS Cpep capsule Commonly known as:  CREON Take 2 capsules (72,000 Units total) by mouth 3 (three) times daily before meals.   loratadine 10 MG tablet Commonly known as:  CLARITIN Take 10 mg by mouth as needed for allergies.   lovastatin 40 MG tablet Commonly known as:  MEVACOR Take 1 tablet (40 mg total) by mouth at bedtime.   Magnesium 500 MG Tabs Take 1 tablet  by mouth as needed.   metoCLOPramide 5 MG tablet Commonly known as:  REGLAN TAKE 1 TABLET(5 MG) BY MOUTH THREE TIMES DAILY BEFORE MEALS   metoCLOPramide 5 MG tablet Commonly known as:  REGLAN TAKE 1 TABLET(5 MG) BY MOUTH THREE TIMES DAILY BEFORE MEALS   multivitamin tablet Take 1 tablet by mouth daily. Reported on 11/22/2015   omeprazole 40 MG capsule Commonly known as:  PRILOSEC Take 1 capsule (40 mg total) by mouth 2 (two) times daily. Annual appt due in Nov must see provider for future refills   traMADol 50 MG tablet Commonly known as:  ULTRAM TAKE 1 TABLET BY MOUTH EVERY 12 HOURS AS NEEDED FOR MODERATE TO SEVERE PAIN.   trazodone 300 MG tablet Commonly known as:  DESYREL Take 1 tablet (300 mg total) by mouth at bedtime.   vitamin A 10000 UNIT capsule Take 10,000 Units by mouth daily.   vitamin E 1000 UNIT capsule Generic drug:  vitamin E Take 1,000 Units by mouth daily.       Allergies:  Allergies  Allergen Reactions  . Penicillins Anaphylaxis  . Sulfonamide Derivatives Anaphylaxis    Past Medical History:  Diagnosis Date  . ALLERGIC RHINITIS   . Allergy   . Anemia   . Anxiety   . Arthritis   . BIPOLAR AFFECTIVE DISORDER   . Cataract   . DEPRESSION   . Diabetes mellitus type I ([place])   . DIABETES MELLITUS, TYPE II    follows with endo  . Fibromyalgia   . GERD   . HEPATITIS B   . HYPERLIPIDEMIA   . LOW BACK PAIN, CHRONIC   . MIGRAINE HEADACHE   . NARCOTIC ABUSE    hx of  . NECK PAIN, CHRONIC   . Neuropathy   . Seizures ([place])    pt  states, "if my blood sugar drops to the 20's, I convulse.  It hasn't happened in a long time."  . SMOKER     Past Surgical History:  Procedure Laterality Date  . CESAREAN SECTION    . COLONOSCOPY    . TUBAL LIGATION    . UPPER GASTROINTESTINAL ENDOSCOPY      Family History  Problem Relation Age of Onset  . Arthritis Mother   . Colon cancer Mother        ? age of dx  . Arthritis Father   . Kidney disease Father   . Kidney cancer Father   . Heart attack Brother   . Bladder Cancer Sister   . Hyperlipidemia Other   . Kidney cancer Paternal Aunt   . Anxiety disorder Neg Hx   . Bipolar disorder Neg Hx   . Depression Neg Hx   . Breast cancer Neg Hx   . Esophageal cancer Neg Hx   . Stomach cancer Neg Hx   . Rectal cancer Neg Hx     Social History:  reports that she has been smoking cigarettes.  She has a 60.00 pack-year smoking history. She has never used smokeless tobacco. She reports that she drinks alcohol. She reports that she does not use drugs.  Review of Systems:   WEIGHT loss: She is gaining back weight     Wt Readings from Last 3 Encounters:  10/17/17 104 lb (47.2 kg)  08/08/17 100 lb 9.6 oz (45.6 kg)  05/30/17 99 lb (44.9 kg)    Lab Results  Component Value Date   TSH 0.84 03/20/2017    Has probable gastroparesis treated with Reglan,  she finds this beneficial for treating symptoms   NEUROPATHY: She has burning in her legs and feet at times relieved with taking her gabapentin     DEPRESSION: She has had long-standing depression and anxiety with variable level of control and is on Lamictal   HYPERLIPIDEMIA: The lipid abnormality consists of elevated LDL   Is  on lovastatin 40 mg with  good control   Lab Results  Component Value Date   CHOL 159 07/27/2017   HDL 79.90 07/27/2017   LDLCALC 70 07/27/2017   LDLDIRECT 101.7 03/26/2012   TRIG 48.0 07/27/2017   CHOLHDL 2 07/27/2017     Has history of small goiter, euthyroid  Lab Results    Component Value Date   TSH 0.84 03/20/2017       Examination:   BP 130/66 (BP Location: Left Arm, Patient Position: Sitting, Cuff Size: Normal)   Pulse 77   Ht 5' 3.5" (1.613 m)   Wt 104 lb (47.2 kg)   SpO2 95%   BMI 18.13 kg/m   Body mass index is 18.13 kg/m.    ASSESSMENT/ PLAN:   Diabetes type 1 with consistently poor control  See history of present illness for detailed discussion of current insulin, blood sugar patterns, problems identified  A1c has been usually near normal but this is likely to be from significant hypoglycemia that she has on a regular basis Fructosamine is relatively higher over 300 now  She is starting to use the DexCom but is still not able to use it consistently on her own and only has 1 week recording as above  As before the problems are with not preventing hypoglycemia and also taking excessive amounts of Humalog tending to cause hypoglycemia at times Most of her high readings are related to poor diet, excessive snacking and overeating at times especially when she is having some anxiety or depression issues Recently fasting readings are fairly good with 7 units of Tresiba  Recommendations:  Restart using the DexCom  She will need to pay attention to the alarms on the DexCom to help identify tendency to hypoglycemia and have a snack or juice before the blood sugar gets low depending on the blood sugar level and degree of change being shown  No Humalog unless blood sugars are over 250 or if she is eating very large amounts of carbohydrate or high fat meals or snacks of the time  Also not to exceed 2 units  Discussed that if her blood sugars are fairly good especially at suppertime she will not take Humalog even if she has a serving of carbohydrate  She will also use her sensor to guide her on preventing hypoglycemia based on the blood sugar pattern and trend and this was discussed  She will try to make sure she has a snack with carbohydrate  before she is doing household activities with increased exertion  No change in [place]  Follow-up with A1c in 2 months   There are no Patient Instructions on file for this visit.  Counseling time on subjects discussed in assessment and plan sections is over 50% of today's 25 minute visit   [name] 10/17/2017, 3:57 PM

## 2017-10-18 ENCOUNTER — Telehealth: Payer: Self-pay | Admitting: Dietician

## 2017-10-18 DIAGNOSIS — H2513 Age-related nuclear cataract, bilateral: Secondary | ICD-10-CM | POA: Diagnosis not present

## 2017-10-18 NOTE — Telephone Encounter (Signed)
Called and spoke to patient's mother related to the problem putting on the Dexcom sensor.  Patient is to call the [employer] per recommendation of Vaughan Basta.  She had no questions and will let Jakyra know.  Antonieta Iba, RD, LDN, CDE

## 2017-10-18 NOTE — Telephone Encounter (Signed)
Returned patient call. She stated that she was unable to get the [facility] sensor on after her and her mother trying 3-4 times.  Message has been sent to [facility], [place].  Leslie Lane, RD, LDN, CDE

## 2017-10-24 ENCOUNTER — Telehealth: Payer: Self-pay | Admitting: Nutrition

## 2017-10-24 NOTE — Telephone Encounter (Signed)
Patient and her mother came in, because she was not able to put the transmitter into the sensor, after last week, when we attached the new sensor to her body.  She was told how to do this last week, but could not remember.  She brought in her transmitter, but it was attached to the sensor, and therfor did not fit.  She and her mother were shown how to disconnect the transmitter from the sensor, and attach the transmitter to the new sensor.  She was then shown how to "start a new sensor" on her receiver.  Mother reported good understanding of this.   Pt. Says that she does not know if she can afford these sensors.  She is needing to order more, but owns them money.  Mother and daughter to work this out.

## 2017-10-30 ENCOUNTER — Other Ambulatory Visit: Payer: Self-pay

## 2017-10-30 DIAGNOSIS — F4010 Social phobia, unspecified: Secondary | ICD-10-CM

## 2017-10-30 DIAGNOSIS — E1065 Type 1 diabetes mellitus with hyperglycemia: Secondary | ICD-10-CM | POA: Diagnosis not present

## 2017-10-30 DIAGNOSIS — F332 Major depressive disorder, recurrent severe without psychotic features: Secondary | ICD-10-CM

## 2017-10-31 ENCOUNTER — Other Ambulatory Visit: Payer: Self-pay

## 2017-10-31 MED ORDER — INSULIN DEGLUDEC 100 UNIT/ML ~~LOC~~ SOPN: 15.0000 [IU] | PEN_INJECTOR | Freq: Every day | SUBCUTANEOUS | 1 refills | Status: DC

## 2017-11-08 ENCOUNTER — Ambulatory Visit (HOSPITAL_COMMUNITY): Payer: Medicare HMO | Admitting: Psychiatry

## 2017-11-08 ENCOUNTER — Encounter (HOSPITAL_COMMUNITY): Payer: Self-pay | Admitting: Psychiatry

## 2017-11-08 VITALS — BP 146/72 | HR 74 | Ht 63.5 in | Wt 101.0 lb

## 2017-11-08 DIAGNOSIS — F17210 Nicotine dependence, cigarettes, uncomplicated: Secondary | ICD-10-CM | POA: Diagnosis not present

## 2017-11-08 DIAGNOSIS — F411 Generalized anxiety disorder: Secondary | ICD-10-CM

## 2017-11-08 DIAGNOSIS — G4700 Insomnia, unspecified: Secondary | ICD-10-CM

## 2017-11-08 DIAGNOSIS — G4701 Insomnia due to medical condition: Secondary | ICD-10-CM | POA: Diagnosis not present

## 2017-11-08 DIAGNOSIS — R51 Headache: Secondary | ICD-10-CM | POA: Diagnosis not present

## 2017-11-08 DIAGNOSIS — H53149 Visual discomfort, unspecified: Secondary | ICD-10-CM

## 2017-11-08 DIAGNOSIS — F332 Major depressive disorder, recurrent severe without psychotic features: Secondary | ICD-10-CM

## 2017-11-08 DIAGNOSIS — F4010 Social phobia, unspecified: Secondary | ICD-10-CM

## 2017-11-08 MED ORDER — LAMOTRIGINE 100 MG PO TABS: 100.0000 mg | ORAL_TABLET | Freq: Two times a day (BID) | ORAL | 0 refills | Status: DC

## 2017-11-08 MED ORDER — BUSPIRONE HCL 5 MG PO TABS: 5.0000 mg | ORAL_TABLET | Freq: Two times a day (BID) | ORAL | 1 refills | Status: DC

## 2017-11-08 MED ORDER — TRAZODONE HCL 300 MG PO TABS: 300.0000 mg | ORAL_TABLET | Freq: Every day | ORAL | 0 refills | Status: DC

## 2017-11-08 NOTE — Progress Notes (Signed)
BH MD/PA/NP OP Progress Note  11/08/2017 10:05 AM Leslie Lane  MRN:  [id number]  Chief Complaint:  Chief Complaint    Follow-up     HPI: "I am tired all the time. I can't sit to watch a show- I am up and down, up and down. I am an anxious wreck". Her startle reflex is high. Pt is stressed by a number of family medical problems. She is very worried about various members recovery. Pt is performing all of the errands and it very anxiety provoking for her. She will run in and out the stores as past as possible. Her social anxiety remains high. She hates waiting on everyone and prefers to do things quickly.  Sleep is good with Trazodone. Appetite is good but she continues to lose weight. Depression is "alright. I am just everything that is going on". She feels she has reached her stress tolerance and cries often. She hates having to take money from her parents because she is debt.  Pt denies recent manic and hypomanic symptoms including periods of decreased need for sleep, increased energy, mood lability, impulsivity, FOI, and excessive spending. Pt denies SI/HI but does admit to passive thoughts of death.   Visit Diagnosis:    ICD-10-CM   1. GAD (generalized anxiety disorder) F41.1 busPIRone (BUSPAR) 5 MG tablet  2. Major depressive disorder, recurrent, severe without psychotic features (HCC) F33.2 lamoTRIgine (LAMICTAL) 100 MG tablet    trazodone (DESYREL) 300 MG tablet  3. Social anxiety disorder F40.10 lamoTRIgine (LAMICTAL) 100 MG tablet    busPIRone (BUSPAR) 5 MG tablet  4. Insomnia due to medical condition G47.01 trazodone (DESYREL) 300 MG tablet      Past Psychiatric History:  Anxiety:Yes Bipolar Disorder:No Depression:Yes Mania:No Psychosis:No Schizophrenia:No Personality Disorder:No Hospitalization for psychiatric illness:No History of Electroconvulsive Shock Therapy:No Prior Suicide Attempts:Yes  Seroquel, Trazodone-ineffective  Lexapro  Depakote  Effexor  and Cymbalta- ineffective  Wellbutrin- ineffective  Abilify- ineffective  Paxil-effectivebutnotthe second time it was prescribed          Past Medical History:  Past Medical History:  Diagnosis Date  . ALLERGIC RHINITIS   . Allergy   . Anemia   . Anxiety   . Arthritis   . BIPOLAR AFFECTIVE DISORDER   . Cataract   . DEPRESSION   . Diabetes mellitus type I ([place])   . DIABETES MELLITUS, TYPE II    follows with endo  . Fibromyalgia   . GERD   . HEPATITIS B   . HYPERLIPIDEMIA   . LOW BACK PAIN, CHRONIC   . MIGRAINE HEADACHE   . NARCOTIC ABUSE    hx of  . NECK PAIN, CHRONIC   . Neuropathy   . Seizures ([place])    pt states, "if my blood sugar drops to the 20's, I convulse.  It hasn't happened in a long time."  . SMOKER     Past Surgical History:  Procedure Laterality Date  . CESAREAN SECTION    . COLONOSCOPY    . TUBAL LIGATION    . UPPER GASTROINTESTINAL ENDOSCOPY      Family Psychiatric History: Family History  Problem Relation Age of Onset  . Arthritis Mother   . Colon cancer Mother        ? age of dx  . Arthritis Father   . Kidney disease Father   . Kidney cancer Father   . Heart attack Brother   . Bladder Cancer Sister   . Hyperlipidemia Other   . Kidney  cancer Paternal Aunt   . Anxiety disorder Neg Hx   . Bipolar disorder Neg Hx   . Depression Neg Hx   . Breast cancer Neg Hx   . Esophageal cancer Neg Hx   . Stomach cancer Neg Hx   . Rectal cancer Neg Hx     Social History:  Social History   Socioeconomic History  . Marital status: Divorced    Spouse name: Not on file  . Number of children: 1  . Years of education: Not on file  . Highest education level: Not on file  Occupational History  . Occupation: diabled  Social Needs  . Financial resource strain: Not on file  . Food insecurity:    Worry: Not on file    Inability: Not on file  . Transportation needs:    Medical: Not on file    Non-medical: Not on file  Tobacco Use  .  Smoking status: Current Every Day Smoker    Packs/day: 1.50    Years: 40.00    Pack years: 60.00    Types: Cigarettes  . Smokeless tobacco: Never Used  . Tobacco comment: tobacco info given 04/18/16  Substance and Sexual Activity  . Alcohol use: Yes    Alcohol/week: 0.0 oz    Comment: occasionally- once every 2 weeks 1-2 beers  . Drug use: No  . Sexual activity: Never    Birth control/protection: Surgical, Abstinence  Lifestyle  . Physical activity:    Days per week: Not on file    Minutes per session: Not on file  . Stress: Not on file  Relationships  . Social connections:    Talks on phone: Not on file    Gets together: Not on file    Attends religious service: Not on file    Active member of club or organization: Not on file    Attends meetings of clubs or organizations: Not on file    Relationship status: Not on file  Other Topics Concern  . Not on file  Social History Narrative   Divorced, disabled   Lives with parents    Allergies:  Allergies  Allergen Reactions  . Penicillins Anaphylaxis  . Sulfonamide Derivatives Anaphylaxis    Metabolic Disorder Labs: Lab Results  Component Value Date   HGBA1C 6.1 07/27/2017   No results found for: PROLACTIN Lab Results  Component Value Date   CHOL 159 07/27/2017   TRIG 48.0 07/27/2017   HDL 79.90 07/27/2017   CHOLHDL 2 07/27/2017   VLDL 9.6 07/27/2017   LDLCALC 70 07/27/2017   LDLCALC 84 10/20/2016   Lab Results  Component Value Date   TSH 0.84 03/20/2017   TSH 1.38 10/20/2016    Therapeutic Level Labs: No results found for: LITHIUM No results found for: VALPROATE No components found for:  CBMZ  Current Medications: Current Outpatient Medications  Medication Sig Dispense Refill  . ACCU-CHEK AVIVA PLUS test strip TEST BLOOD SUGAR FOUR TIMES DAILY 400 each 3  . ACCU-CHEK SOFTCLIX LANCETS lancets Use as instructed to check blood sugar 4 times per day dx code E10.65 (Patient taking differently: Use as  instructed to check blood sugar 8 times per day dx code E10.65) 400 each 1  . Alcohol Swabs (B-D SINGLE USE SWABS REGULAR) PADS USE FOUR TIMES DAILY 400 each 2  . aspirin EC 81 MG tablet Take 81 mg by mouth every morning.    . B-D ULTRAFINE III SHORT PEN 31G X 8 MM MISC USE TO INJECT INSULIN  DAILY. 100 each 0  . Blood Glucose Calibration (ACCU-CHEK AVIVA) SOLN USE AS DIRECTED 1 each 1  . Blood Glucose Monitoring Suppl (ACCU-CHEK AVIVA PLUS) w/Device KIT USE AS DIRECTED 1 kit 0  . diclofenac sodium (VOLTAREN) 1 % GEL APPLY 2 GRAMS EXTERNALLY TO THE AFFECTED AREA FOUR TIMES DAILY 100 g 0  . gabapentin (NEURONTIN) 100 MG capsule TAKE 1 TO 2 CAPSULES(100 TO 200 MG) BY MOUTH TWICE DAILY 120 capsule 3  . glucagon (GLUCAGON EMERGENCY) 1 MG injection INJECT 1 MG INTO THE SKIN AT ONCE AS NEEDED 2 kit 5  . guaiFENesin (ROBITUSSIN) 100 MG/5ML SOLN Take 10 mLs (200 mg total) by mouth every 4 (four) hours as needed for cough or to loosen phlegm. 1200 mL 6  . insulin aspart (NOVOLOG) 100 UNIT/ML injection Sliding Scale 10 units in the am and 8 in the pm. 1-3 units as needed for high blood sugars. 10 mL 11  . insulin degludec (TRESIBA FLEXTOUCH) 100 UNIT/ML SOPN FlexTouch Pen Inject 0.15 mLs (15 Units total) into the skin daily. 15 pen 1  . lamoTRIgine (LAMICTAL) 100 MG tablet Take 1 tablet (100 mg total) by mouth 2 (two) times daily. 180 tablet 0  . loratadine (CLARITIN) 10 MG tablet Take 10 mg by mouth as needed for allergies.    Marland Kitchen lovastatin (MEVACOR) 40 MG tablet Take 1 tablet (40 mg total) by mouth at bedtime. 90 tablet 2  . Magnesium 500 MG TABS Take 1 tablet by mouth as needed.    . metoCLOPramide (REGLAN) 5 MG tablet TAKE 1 TABLET(5 MG) BY MOUTH THREE TIMES DAILY BEFORE MEALS 90 tablet 0  . metoCLOPramide (REGLAN) 5 MG tablet TAKE 1 TABLET(5 MG) BY MOUTH THREE TIMES DAILY BEFORE MEALS 270 tablet 1  . Multiple Vitamin (MULTIVITAMIN) tablet Take 1 tablet by mouth daily. Reported on 11/22/2015    .  omeprazole (PRILOSEC) 40 MG capsule Take 1 capsule (40 mg total) by mouth 2 (two) times daily. Annual appt due in Nov must see provider for future refills 60 capsule 2  . traMADol (ULTRAM) 50 MG tablet TAKE 1 TABLET BY MOUTH EVERY 12 HOURS AS NEEDED FOR MODERATE TO SEVERE PAIN. 60 tablet 0  . trazodone (DESYREL) 300 MG tablet Take 1 tablet (300 mg total) by mouth at bedtime. 90 tablet 0  . vitamin A 10000 UNIT capsule Take 10,000 Units by mouth daily.    . vitamin E (VITAMIN E) 1000 UNIT capsule Take 1,000 Units by mouth daily.      . busPIRone (BUSPAR) 5 MG tablet Take 1 tablet (5 mg total) by mouth 2 (two) times daily. 60 tablet 1  . lipase/protease/amylase (CREON) 36000 UNITS CPEP capsule Take 2 capsules (72,000 Units total) by mouth 3 (three) times daily before meals. (Patient not taking: Reported on 11/08/2017) 300 capsule 3   No current facility-administered medications for this visit.      Musculoskeletal: Strength & Muscle Tone: within normal limits Gait & Station: normal Patient leans: N/A  Psychiatric Specialty Exam: Review of Systems  Constitutional: Positive for weight loss. Negative for chills, diaphoresis and fever.  Eyes: Positive for photophobia and pain. Negative for redness.       Dry eyes from cataracts surgery   Neurological: Positive for headaches.    Blood pressure (!) 146/72, pulse 74, height 5' 3.5" (1.613 m), weight 101 lb (45.8 kg), SpO2 95 %.Body mass index is 17.61 kg/m.  General Appearance: Casual  Eye Contact:  Good  Speech:  Clear  and Coherent and Normal Rate  Volume:  Normal  Mood:  Anxious  Affect:  Congruent  Thought Process:  Coherent and Descriptions of Associations: Intact  Orientation:  Full (Time, Place, and Person)  Thought Content: Rumination   Suicidal Thoughts:  No  Homicidal Thoughts:  No  Memory:  Immediate;   Good Recent;   Good Remote;   Good  Judgement:  Fair  Insight:  Fair  Psychomotor Activity:  Increased  Concentration:   Concentration: Fair and Attention Span: Fair  Recall:  Good  Fund of Knowledge: Good  Language: Good  Akathisia:  No  Handed:  Right  AIMS (if indicated): not done  Assets:  Communication Skills Desire for Improvement Housing Leisure Time Social Support Transportation  ADL's:  Intact  Cognition: WNL  Sleep:  Good   Screenings: Mini-Mental     Office Visit from 05/30/2017 in [place]  Total Score (max 30 points )  27    PHQ2-9     Office Visit from 05/30/2017 in [place] Office Visit from 03/26/2017 in [place] from [facility] Office Visit from 04/13/2014 in [place] from 02/28/2013 in [facility]  PHQ-2 Total Score  2  1  0  0  1  PHQ-9 Total Score  6  -  -  -  -       Assessment and Plan: MDD-recurrent, moderate without psychotic features; GAD; social anxiety disorder; insomnia; rule out bipolar disorder    Medication management with supportive therapy. Risks and benefits, side effects and alternative treatment options discussed with patient. Pt was given an opportunity to ask questions about medication, illness, and treatment. All current psychiatric medications have been reviewed and discussed with the patient and adjusted as clinically appropriate. The patient has been provided an accurate and updated list of the medications being now prescribed. Patient expressed understanding of how their medications were to be used.  Pt verbalized understanding and verbal consent obtained for treatment.  The risk of un-intended pregnancy is low based on the fact that pt reports she is postmenopausal.  pt is aware that these meds carry a teratogenic risk. Pt will discuss plan of action if she does or plans to become pregnant in the future.  Status of current problems: anxiety is  worsening  Meds: Lamictal 100 mg p.o. twice daily for MDD Trazodone 300 mg p.o. nightly for MDD and insomnia Start trial of Buspar 3m po BID for GAD and social anxiety-she is worried about the side effect of fatigue/sleepiness but is willing to give the medication a try for a couple of weeks  Labs: none  Therapy: brief supportive therapy provided. Discussed psychosocial stressors in detail.     Consultations: Encouraged to follow up with PCP as needed  Pt denies SI and is at an acute low risk for suicide. Patient told to call clinic if any problems occur. Patient advised to go to ER if they should develop SI/HI, side effects, or if symptoms worsen. Has crisis numbers to call if needed. Pt verbalized understanding.  F/up in 2 months or sooner if needed    SCharlcie Cradle MD 11/08/2017, 10:05 AM

## 2017-11-18 ENCOUNTER — Other Ambulatory Visit: Payer: Self-pay | Admitting: Internal Medicine

## 2017-11-23 ENCOUNTER — Other Ambulatory Visit: Payer: Self-pay | Admitting: Internal Medicine

## 2017-11-23 NOTE — Telephone Encounter (Signed)
LOV 05/30/2017

## 2017-11-27 ENCOUNTER — Ambulatory Visit (INDEPENDENT_AMBULATORY_CARE_PROVIDER_SITE_OTHER): Payer: Medicare HMO | Admitting: Internal Medicine

## 2017-11-27 ENCOUNTER — Encounter: Payer: Self-pay | Admitting: Internal Medicine

## 2017-11-27 VITALS — BP 130/62 | HR 72 | Temp 98.0°F | Ht 63.5 in | Wt 108.0 lb

## 2017-11-27 DIAGNOSIS — G8929 Other chronic pain: Secondary | ICD-10-CM

## 2017-11-27 DIAGNOSIS — Z720 Tobacco use: Secondary | ICD-10-CM

## 2017-11-27 DIAGNOSIS — M545 Low back pain: Secondary | ICD-10-CM

## 2017-11-27 MED ORDER — LOVASTATIN 40 MG PO TABS: 40.0000 mg | ORAL_TABLET | Freq: Every day | ORAL | 2 refills | Status: DC

## 2017-11-27 MED ORDER — TRAMADOL HCL 50 MG PO TABS: 50.0000 mg | ORAL_TABLET | ORAL | 0 refills | Status: DC

## 2017-11-27 NOTE — Patient Instructions (Addendum)
We have sent in the refills for you.   

## 2017-11-27 NOTE — Assessment & Plan Note (Signed)
She is smoking more lately due to boredom. Reminded about the health risks associated with smoking and asked to quit. She is not able to quit at this time. Asked her to try to find another hobby other than smoking.

## 2017-11-27 NOTE — Progress Notes (Signed)
   Subjective:    Patient ID: Leslie Lane, female    DOB: 1955/08/27, 62 y.o.   MRN: [id number]  HPI The patient is a 62 YO female coming in for concerns about pain. She previously was taking tramadol #60 about every 2-3 months. She has not had filled since September and [place] narcotic database reviewed and no other fills from different providers. She admits to not taking as often due to the constipation which is moderate to severe with taking. She takes maximum 1 pill every other day. She does admit to sometimes giving some to her mother who also takes tramadol if she runs out. She denies worsening pain. Pain is in upper back/shoulder. She denies injury or overuse. She is smoking more than usual due to boredom. She is walking well and tends to not want to sit still. She does not have any hobbies.   Review of Systems  Constitutional: Negative.   HENT: Negative.   Eyes: Negative.   Respiratory: Negative for cough, chest tightness and shortness of breath.   Cardiovascular: Negative for chest pain, palpitations and leg swelling.  Gastrointestinal: Negative for abdominal distention, abdominal pain, constipation, diarrhea, nausea and vomiting.  Musculoskeletal: Positive for arthralgias, back pain and myalgias.  Skin: Negative.   Neurological: Negative.   Psychiatric/Behavioral: Negative.       Objective:   Physical Exam  Constitutional: She is oriented to person, place, and time. She appears well-developed and well-nourished.  HENT:  Head: Normocephalic and atraumatic.  Eyes: EOM are normal.  Neck: Normal range of motion.  Cardiovascular: Normal rate and regular rhythm.  Pulmonary/Chest: Effort normal and breath sounds normal. No respiratory distress. She has no wheezes. She has no rales.  Abdominal: Soft. Bowel sounds are normal. She exhibits no distension. There is no tenderness. There is no rebound.  Musculoskeletal: She exhibits no edema.  Neurological: She is alert and oriented to  person, place, and time. Coordination normal.  Skin: Skin is warm and dry.   Vitals:   11/27/17 0954  BP: 130/62  Pulse: 72  Temp: 98 F (36.7 C)  TempSrc: Oral  SpO2: 96%  Weight: 108 lb (49 kg)  Height: 5' 3.5" (1.613 m)      Assessment & Plan:

## 2017-11-27 NOTE — Assessment & Plan Note (Signed)
Rx for tramadol #15 no refills. She has not been using often as her last 60 pills lasted about 8 months which is about 10 pills per month. She admits to no more often than every other day during visit. She does admit to giving to her mother when she runs out at the visit and reminded her that she should not do this.

## 2017-12-13 ENCOUNTER — Other Ambulatory Visit (INDEPENDENT_AMBULATORY_CARE_PROVIDER_SITE_OTHER): Payer: Medicare HMO

## 2017-12-13 DIAGNOSIS — E1065 Type 1 diabetes mellitus with hyperglycemia: Secondary | ICD-10-CM

## 2017-12-13 LAB — BASIC METABOLIC PANEL
BUN: 23 mg/dL (ref 6–23)
CALCIUM: 9.6 mg/dL (ref 8.4–10.5)
CO2: 33 mEq/L — ABNORMAL HIGH (ref 19–32)
CREATININE: 0.85 mg/dL (ref 0.40–1.20)
Chloride: 101 mEq/L (ref 96–112)
GFR: 71.65 mL/min (ref >60.00)
Glucose, Bld: 99 mg/dL (ref 70–99)
Potassium: 5.1 mEq/L (ref 3.5–5.1)
SODIUM: 139 meq/L (ref 135–145)

## 2017-12-13 LAB — MICROALBUMIN / CREATININE URINE RATIO
CREATININE, U: 90.1 mg/dL
MICROALB UR: 0.9 mg/dL (ref 0.0–1.9)
MICROALB/CREAT RATIO: 1 mg/g (ref 0.0–30.0)

## 2017-12-13 LAB — HEMOGLOBIN A1C: HEMOGLOBIN A1C: 6.6 % — AB (ref 4.6–6.5)

## 2017-12-17 ENCOUNTER — Encounter: Payer: Self-pay | Admitting: Endocrinology

## 2017-12-17 ENCOUNTER — Ambulatory Visit: Payer: Medicare HMO | Admitting: Endocrinology

## 2017-12-17 VITALS — BP 138/68 | HR 67 | Ht 63.5 in | Wt 103.8 lb

## 2017-12-17 DIAGNOSIS — E1065 Type 1 diabetes mellitus with hyperglycemia: Secondary | ICD-10-CM

## 2017-12-17 NOTE — Progress Notes (Signed)
Patient ID: Leslie Lane, female   DOB: January 02, 1956, 62 y.o.   MRN: [id number]   Reason for Appointment: Diabetes follow-up   History of Present Illness    Diagnosis: Type 1 DIABETES MELITUS, diagnosed 1967      She has had labile blood sugar control over the last several years even though A1c has been usually around 7% She has had less lability and hypoglycemia with taking b.i.d. Lantus compared once a day She has been very sensitive to fast acting insulin and frequently does not require mealtime coverage She cannot tolerate Toujeo because of reported episode of headache, bloating and nausea with the first dose  RECENT history:  Insulin regimen: Tresiba 7 units daily, Novolog 1-2 units up to 4 times a day   Her last A1c was 6.1 but now 6.6   Current blood sugar patterns and problems identified:  She  has stopped using a Dexcom sensor even though she was told to start this back  She says she cannot remember how to get it started and did not ask for guidance on this  She was able to prevent hypoglycemia much better with this  Currently is having several blood sugars that are low except on waking up and probably more so in the last 10 days  Again her management is difficult because of inconsistent diet and activity level  She tends to have low blood sugars when she is active but again does not stop to have a snack  Without any warning symptoms she has had blood sugars as low as 24 which occurred late in the evening preceded by fairly normal readings earlier in the day  No severe hypoglycemia  She has periodically high fasting readings up to as much as 25 7 in the morning waking up which she thinks is from overeating late at night  She thinks that she does not want to see her blood sugar going up so she will sometimes even take 1 unit of NovoLog when planning to eat and blood sugar is mildly increased suggest today when her blood sugar went down to 46  postprandially  Her appetite is variable and sometimes may not finish her meal because of her history of gastroparesis  HIGHEST blood sugar on average are now fasting but also has periodic high blood sugars in the late afternoon and some late at night  HYPOGLYCEMIA: As above, may not have symptoms with low blood sugars and does not have early warning symptoms. Gets confused frequently and she depends on her mother to recognize low sugars and treat them. Her mother knows how to give Glucagon injection She will treat her low blood sugars usually with juice, does not like glucose tablets; sometimes will eat food at the same time      DIET: She is trying to get protein with eggs at breakfast Meal times: Variable.Marland Kitchen  She will eat more when she is anxious  Monitors blood glucose:  about 4-5 times a day.    Glucometer:   Accu-Chek [facility]iva   ACCU-CHEK readings:   checking blood sugar 4-6 times a day  FASTING range 77-264, AVERAGE 180 Overall AVERAGE 143 with standard deviation is 68 Average midday 118, afternoon 160 and bedtime 127   Wt Readings from Last 3 Encounters:  12/17/17 103 lb 12.8 oz (47.1 kg)  11/27/17 108 lb (49 kg)  10/17/17 104 lb (47.2 kg)    Lab Results  Component Value Date   HGBA1C 6.6 (H) 12/13/2017  HGBA1C 6.1 07/27/2017   HGBA1C 5.9 05/01/2017   Lab Results  Component Value Date   MICROALBUR 0.9 12/13/2017   LDLCALC 70 07/27/2017   CREATININE 0.85 12/13/2017        Allergies as of 12/17/2017      Reactions   Penicillins Anaphylaxis   Sulfonamide Derivatives Anaphylaxis      Medication List        Accurate as of 12/17/17  9:51 PM. Always use your most recent med list.          ACCU-CHEK AVIVA PLUS test strip Generic drug:  glucose blood TEST BLOOD SUGAR FOUR TIMES DAILY   ACCU-CHEK AVIVA PLUS w/Device Kit USE AS DIRECTED   ACCU-CHEK AVIVA Soln USE AS DIRECTED   ACCU-CHEK SOFTCLIX LANCETS lancets Use as instructed to check blood sugar 4  times per day dx code E10.65   aspirin EC 81 MG tablet Take 81 mg by mouth every morning.   B-D SINGLE USE SWABS REGULAR Pads USE FOUR TIMES DAILY   B-D ULTRAFINE III SHORT PEN 31G X 8 MM Misc Generic drug:  Insulin Pen Needle USE TO INJECT INSULIN DAILY.   busPIRone 5 MG tablet Commonly known as:  BUSPAR Take 1 tablet (5 mg total) by mouth 2 (two) times daily.   diclofenac sodium 1 % Gel Commonly known as:  VOLTAREN APPLY 2 GRAMS EXTERNALLY TO THE AFFECTED AREA FOUR TIMES DAILY   gabapentin 100 MG capsule Commonly known as:  NEURONTIN TAKE 1 TO 2 CAPSULES(100 TO 200 MG) BY MOUTH TWICE DAILY   glucagon 1 MG injection Commonly known as:  GLUCAGON EMERGENCY INJECT 1 MG INTO THE SKIN AT ONCE AS NEEDED   guaiFENesin 100 MG/5ML Soln Commonly known as:  ROBITUSSIN Take 10 mLs (200 mg total) by mouth every 4 (four) hours as needed for cough or to loosen phlegm.   insulin aspart 100 UNIT/ML injection Commonly known as:  NOVOLOG Sliding Scale 10 units in the am and 8 in the pm. 1-3 units as needed for high blood sugars.   insulin degludec 100 UNIT/ML Sopn FlexTouch Pen Commonly known as:  TRESIBA FLEXTOUCH Inject 0.15 mLs (15 Units total) into the skin daily.   lamoTRIgine 100 MG tablet Commonly known as:  LAMICTAL Take 1 tablet (100 mg total) by mouth 2 (two) times daily.   lipase/protease/amylase 36000 UNITS Cpep capsule Commonly known as:  CREON Take 2 capsules (72,000 Units total) by mouth 3 (three) times daily before meals.   loratadine 10 MG tablet Commonly known as:  CLARITIN Take 10 mg by mouth as needed for allergies.   lovastatin 40 MG tablet Commonly known as:  MEVACOR Take 1 tablet (40 mg total) by mouth at bedtime.   Magnesium 500 MG Tabs Take 1 tablet by mouth as needed.   metoCLOPramide 5 MG tablet Commonly known as:  REGLAN TAKE 1 TABLET(5 MG) BY MOUTH THREE TIMES DAILY BEFORE MEALS   multivitamin tablet Take 1 tablet by mouth daily. Reported on  11/22/2015   omeprazole 40 MG capsule Commonly known as:  PRILOSEC Take 1 capsule (40 mg total) by mouth 2 (two) times daily. Annual appt due in Nov must see provider for future refills   omeprazole 40 MG capsule Commonly known as:  PRILOSEC TAKE 1 CAPSULE(40 MG) BY MOUTH DAILY   traMADol 50 MG tablet Commonly known as:  ULTRAM Take 1 tablet (50 mg total) by mouth every other day.   trazodone 300 MG tablet Commonly known as:  DESYREL Take 1 tablet (300 mg total) by mouth at bedtime.   vitamin A 10000 UNIT capsule Take 10,000 Units by mouth daily.   vitamin E 1000 UNIT capsule Generic drug:  vitamin E Take 1,000 Units by mouth daily.       Allergies:  Allergies  Allergen Reactions  . Penicillins Anaphylaxis  . Sulfonamide Derivatives Anaphylaxis    Past Medical History:  Diagnosis Date  . ALLERGIC RHINITIS   . Allergy   . Anemia   . Anxiety   . Arthritis   . BIPOLAR AFFECTIVE DISORDER   . Cataract   . DEPRESSION   . Diabetes mellitus type I ([place])   . DIABETES MELLITUS, TYPE II    follows with endo  . Fibromyalgia   . GERD   . HEPATITIS B   . HYPERLIPIDEMIA   . LOW BACK PAIN, CHRONIC   . MIGRAINE HEADACHE   . NARCOTIC ABUSE    hx of  . NECK PAIN, CHRONIC   . Neuropathy   . Seizures ([place])    pt states, "if my blood sugar drops to the 20's, I convulse.  It hasn't happened in a long time."  . SMOKER     Past Surgical History:  Procedure Laterality Date  . CESAREAN SECTION    . COLONOSCOPY    . TUBAL LIGATION    . UPPER GASTROINTESTINAL ENDOSCOPY      Family History  Problem Relation Age of Onset  . Arthritis Mother   . Colon cancer Mother        ? age of dx  . Arthritis Father   . Kidney disease Father   . Kidney cancer Father   . Heart attack Brother   . Bladder Cancer Sister   . Hyperlipidemia Other   . Kidney cancer Paternal Aunt   . Anxiety disorder Neg Hx   . Bipolar disorder Neg Hx   . Depression Neg Hx   . Breast cancer Neg Hx     . Esophageal cancer Neg Hx   . Stomach cancer Neg Hx   . Rectal cancer Neg Hx     Social History:  reports that she has been smoking cigarettes.  She has a 60.00 pack-year smoking history. She has never used smokeless tobacco. She reports that she drinks alcohol. She reports that she does not use drugs.  Review of Systems:   WEIGHT loss: She is losing back weight and not clear why     Wt Readings from Last 3 Encounters:  12/17/17 103 lb 12.8 oz (47.1 kg)  11/27/17 108 lb (49 kg)  10/17/17 104 lb (47.2 kg)    Lab Results  Component Value Date   TSH 0.84 03/20/2017    Has probable gastroparesis treated with Reglan, she thinks she is taking this 30-minute before eating but sometimes has overnight symptoms of cramps and nausea   NEUROPATHY: She has burning in her legs and feet at times partially relieved with her gabapentin    DEPRESSION: She has had long-standing depression and anxiety with variable level of control and is on Lamictal   HYPERLIPIDEMIA: The lipid abnormality consists of elevated LDL   Is  on lovastatin 40 mg with  good control   Lab Results  Component Value Date   CHOL 159 07/27/2017   HDL 79.90 07/27/2017   LDLCALC 70 07/27/2017   LDLDIRECT 101.7 03/26/2012   TRIG 48.0 07/27/2017   CHOLHDL 2 07/27/2017     Has history of small goiter, euthyroid  Lab Results  Component Value Date   TSH 0.84 03/20/2017       Examination:   BP 138/68 (BP Location: Left Arm, Patient Position: Sitting, Cuff Size: Normal)   Pulse 67   Ht 5' 3.5" (1.613 m)   Wt 103 lb 12.8 oz (47.1 kg)   SpO2 97%   BMI 18.10 kg/m   Body mass index is 18.1 kg/m.    ASSESSMENT/ PLAN:   Diabetes type 1 with consistently poor control  See history of present illness for detailed discussion of current insulin, blood sugar patterns, problems identified  A1c has been consistently fairly good but she has frequent hypoglycemia as discussed above Hyperglycemia is related to  poor eating especially late at night Again discussed that she is needing minimal amount of rapid acting insulin and this tends to cause hypoglycemia  Recommendations:  Restart using the DexCom  She will set up an appointment for the nurse educator to get this set up and learn how to use it properly also including setting of the alarms and paying attention to blood sugar trends both getting higher and lower  Discussed detail adjustment of her diet, snacks when blood sugars are low normal or low  She will need to prevent hypoglycemia when she is active  Also discussed avoiding NovoLog coverage for less blood sugars are significantly high or eating a very large carbohydrate meal with preceding high sugar  Take metoclopramide at bedtime also to reduce overnight nausea  She needs to stop eating large carbohydrate snacks late at night  No change in [place] 7 units  Follow-up with A1c in 2 months  She can use NovoLog for her mealtime coverage with a pen which will be more accurate   Patient Instructions  May take metoclopramide 4 x  Daily including at bedtime  Do not take NovoLog unless you have eaten a very large carbohydrate meal or blood sugar is continuing to go up. When starting the Dexcom sensor keep an eye on the blood sugar after meals  Also monitor blood sugar closely when being more active and if trending downwards have 1-3 glucose tablets Also make sure you have a snack when you are being more active for more than 30 minutes  No Novolog for small meals or if the blood sugar is below 200 before eating     Counseling time on subjects discussed in assessment and plan sections is over 50% of today's 25 minute visit   [name] 12/17/2017, 9:51 PM

## 2017-12-17 NOTE — Patient Instructions (Addendum)
May take metoclopramide 4 x  Daily including at bedtime  Do not take NovoLog unless you have eaten a very large carbohydrate meal or blood sugar is continuing to go up. When starting the Dexcom sensor keep an eye on the blood sugar after meals  Also monitor blood sugar closely when being more active and if trending downwards have 1-3 glucose tablets Also make sure you have a snack when you are being more active for more than 30 minutes  No Novolog for small meals or if the blood sugar is below 200 before eating

## 2017-12-18 ENCOUNTER — Encounter: Payer: Self-pay | Admitting: Nutrition

## 2017-12-19 ENCOUNTER — Encounter: Payer: Medicare HMO | Attending: Endocrinology | Admitting: Nutrition

## 2017-12-19 DIAGNOSIS — IMO0002 Reserved for concepts with insufficient information to code with codable children: Secondary | ICD-10-CM

## 2017-12-19 DIAGNOSIS — E108 Type 1 diabetes mellitus with unspecified complications: Secondary | ICD-10-CM

## 2017-12-19 DIAGNOSIS — Z713 Dietary counseling and surveillance: Secondary | ICD-10-CM | POA: Diagnosis not present

## 2017-12-19 DIAGNOSIS — E1065 Type 1 diabetes mellitus with hyperglycemia: Secondary | ICD-10-CM

## 2017-12-19 DIAGNOSIS — E109 Type 1 diabetes mellitus without complications: Secondary | ICD-10-CM | POA: Diagnosis not present

## 2017-12-19 NOTE — Patient Instructions (Signed)
Follow written steps for Dexcom insertion Call Dexcom toll free telephone number for help with this.

## 2017-12-19 NOTE — Progress Notes (Signed)
Patient requested this visit to review how to insert the Dexcom sensor.   She could not remember anything at all from the previous visit   She was shown again how to insert the sensor and how to start the sensor.  Written steps for this were given along with with picture directions.   She had no final questions.

## 2017-12-28 DIAGNOSIS — E1065 Type 1 diabetes mellitus with hyperglycemia: Secondary | ICD-10-CM | POA: Diagnosis not present

## 2018-01-01 ENCOUNTER — Encounter: Payer: Medicare HMO | Attending: Endocrinology | Admitting: Nutrition

## 2018-01-01 DIAGNOSIS — Z713 Dietary counseling and surveillance: Secondary | ICD-10-CM | POA: Diagnosis not present

## 2018-01-01 DIAGNOSIS — E109 Type 1 diabetes mellitus without complications: Secondary | ICD-10-CM | POA: Insufficient documentation

## 2018-01-09 NOTE — Patient Instructions (Signed)
Read over written instructions on how to insert the sensor.

## 2018-01-09 NOTE — Progress Notes (Signed)
Pt. Is here because she is still not sure how to load the sensor.  We reviewed this again, and again written steps were given to her on how to do this.  She did most of this herself this time, and she reports understanding of how to do this.  She was told to put this sheet on her refrigerator to review the steps daily.  She will call if questions.

## 2018-01-17 ENCOUNTER — Encounter (HOSPITAL_COMMUNITY): Payer: Self-pay | Admitting: Psychiatry

## 2018-01-17 ENCOUNTER — Ambulatory Visit (HOSPITAL_COMMUNITY): Payer: Medicare HMO | Admitting: Psychiatry

## 2018-01-17 DIAGNOSIS — F17210 Nicotine dependence, cigarettes, uncomplicated: Secondary | ICD-10-CM | POA: Diagnosis not present

## 2018-01-17 DIAGNOSIS — F332 Major depressive disorder, recurrent severe without psychotic features: Secondary | ICD-10-CM | POA: Diagnosis not present

## 2018-01-17 DIAGNOSIS — F4010 Social phobia, unspecified: Secondary | ICD-10-CM

## 2018-01-17 DIAGNOSIS — G4701 Insomnia due to medical condition: Secondary | ICD-10-CM | POA: Diagnosis not present

## 2018-01-17 MED ORDER — TRAZODONE HCL 300 MG PO TABS: 300.0000 mg | ORAL_TABLET | Freq: Every day | ORAL | 0 refills | Status: DC

## 2018-01-17 MED ORDER — LAMOTRIGINE 100 MG PO TABS: 100.0000 mg | ORAL_TABLET | Freq: Two times a day (BID) | ORAL | 0 refills | Status: DC

## 2018-01-17 NOTE — Progress Notes (Signed)
BH MD/PA/NP OP Progress Note  01/17/2018 10:48 AM Leslie Lane  MRN:  [id number]  Chief Complaint:  Chief Complaint    Anxiety; Depression     HPI: Pt states she stopped taking Buspar about 3 weeks ago due to extreme daytime fatigue. She was taking it BID and stopped both doses. She was so fatigued she felt like a "zombie" and didn't want to get out bed. Sleep is good.   Depression is "ok". She has good days and bad days. She feels depressed when her sugars are high. It makes her feel unmotivated and tired. She does not like to stay at home all day. Pt prefers to be busy and feels good that way. Pt is in credit card debt for $3000. Pt is bored very easily. Pt states "life is as good as it can be". Pt denies SI/HI.   Her anxiety has improved. She is feeling calmer except when startled. She continues to feel nervous when around others. Dulcinea forces herself to go and stay even when she doesn't want to. It makes her happy to do things for other people.  Pt states-taking Lamictal and Trazodone as prescribed and denies SE.   Visit Diagnosis:    ICD-10-CM   1. Social anxiety disorder F40.10 lamoTRIgine (LAMICTAL) 100 MG tablet  2. Insomnia due to medical condition G47.01 trazodone (DESYREL) 300 MG tablet  3. Major depressive disorder, recurrent, severe without psychotic features ([place]) F33.2 trazodone (DESYREL) 300 MG tablet    lamoTRIgine (LAMICTAL) 100 MG tablet      Past Psychiatric History:  Anxiety: Yes Bipolar Disorder: No Depression: Yes Mania: No Psychosis: No Schizophrenia: No Personality Disorder: No  Hospitalization for psychiatric illness: No History of Electroconvulsive Shock Therapy: No Prior Suicide Attempts: Yes  Seroquel, Trazodone-ineffective   Lexapro   Depakote   Effexor and Cymbalta- ineffective   Wellbutrin- ineffective   Abilify- ineffective   Paxil-effective but not the second time it was prescribed          Past Medical History:  Past Medical  History:  Diagnosis Date  . ALLERGIC RHINITIS   . Allergy   . Anemia   . Anxiety   . Arthritis   . BIPOLAR AFFECTIVE DISORDER   . Cataract   . DEPRESSION   . Diabetes mellitus type I ([place])   . DIABETES MELLITUS, TYPE II    follows with endo  . Fibromyalgia   . GERD   . HEPATITIS B   . HYPERLIPIDEMIA   . LOW BACK PAIN, CHRONIC   . MIGRAINE HEADACHE   . NARCOTIC ABUSE    hx of  . NECK PAIN, CHRONIC   . Neuropathy   . Seizures ([place])    pt states, "if my blood sugar drops to the 20's, I convulse.  It hasn't happened in a long time."  . SMOKER     Past Surgical History:  Procedure Laterality Date  . CESAREAN SECTION    . COLONOSCOPY    . TUBAL LIGATION    . UPPER GASTROINTESTINAL ENDOSCOPY      Family Psychiatric History:  Family History  Problem Relation Age of Onset  . Arthritis Mother   . Colon cancer Mother        ? age of dx  . Arthritis Father   . Kidney disease Father   . Kidney cancer Father   . Heart attack Brother   . Bladder Cancer Sister   . Hyperlipidemia Other   . Kidney cancer Paternal Aunt   .  Anxiety disorder Neg Hx   . Bipolar disorder Neg Hx   . Depression Neg Hx   . Breast cancer Neg Hx   . Esophageal cancer Neg Hx   . Stomach cancer Neg Hx   . Rectal cancer Neg Hx     Social History:  Social History   Socioeconomic History  . Marital status: Divorced    Spouse name: Not on file  . Number of children: 1  . Years of education: Not on file  . Highest education level: Not on file  Occupational History  . Occupation: diabled  Social Needs  . Financial resource strain: Not on file  . Food insecurity:    Worry: Not on file    Inability: Not on file  . Transportation needs:    Medical: Not on file    Non-medical: Not on file  Tobacco Use  . Smoking status: Current Every Day Smoker    Packs/day: 1.50    Years: 40.00    Pack years: 60.00    Types: Cigarettes  . Smokeless tobacco: Never Used  . Tobacco comment: tobacco info given  04/18/16  Substance and Sexual Activity  . Alcohol use: Yes    Alcohol/week: 0.0 oz    Comment: occasionally- once every 2 weeks 1-2 beers  . Drug use: No  . Sexual activity: Never    Birth control/protection: Surgical, Abstinence  Lifestyle  . Physical activity:    Days per week: Not on file    Minutes per session: Not on file  . Stress: Not on file  Relationships  . Social connections:    Talks on phone: Not on file    Gets together: Not on file    Attends religious service: Not on file    Active member of club or organization: Not on file    Attends meetings of clubs or organizations: Not on file    Relationship status: Not on file  Other Topics Concern  . Not on file  Social History Narrative   Divorced, disabled   Lives with parents    Allergies:  Allergies  Allergen Reactions  . Penicillins Anaphylaxis  . Sulfonamide Derivatives Anaphylaxis    Metabolic Disorder Labs: Lab Results  Component Value Date   HGBA1C 6.6 (H) 12/13/2017   No results found for: PROLACTIN Lab Results  Component Value Date   CHOL 159 07/27/2017   TRIG 48.0 07/27/2017   HDL 79.90 07/27/2017   CHOLHDL 2 07/27/2017   VLDL 9.6 07/27/2017   LDLCALC 70 07/27/2017   LDLCALC 84 10/20/2016   Lab Results  Component Value Date   TSH 0.84 03/20/2017   TSH 1.38 10/20/2016    Therapeutic Level Labs: No results found for: LITHIUM No results found for: VALPROATE No components found for:  CBMZ  Current Medications: Current Outpatient Medications  Medication Sig Dispense Refill  . ACCU-CHEK AVIVA PLUS test strip TEST BLOOD SUGAR FOUR TIMES DAILY 400 each 3  . ACCU-CHEK SOFTCLIX LANCETS lancets Use as instructed to check blood sugar 4 times per day dx code E10.65 (Patient taking differently: Use as instructed to check blood sugar 8 times per day dx code E10.65) 400 each 1  . Alcohol Swabs (B-D SINGLE USE SWABS REGULAR) PADS USE FOUR TIMES DAILY 400 each 2  . aspirin EC 81 MG tablet Take 81  mg by mouth every morning.    . B-D ULTRAFINE III SHORT PEN 31G X 8 MM MISC USE TO INJECT INSULIN DAILY. 100 each 0  .  Blood Glucose Calibration (ACCU-CHEK AVIVA) SOLN USE AS DIRECTED 1 each 1  . Blood Glucose Monitoring Suppl (ACCU-CHEK AVIVA PLUS) w/Device KIT USE AS DIRECTED 1 kit 0  . diclofenac sodium (VOLTAREN) 1 % GEL APPLY 2 GRAMS EXTERNALLY TO THE AFFECTED AREA FOUR TIMES DAILY 100 g 0  . gabapentin (NEURONTIN) 100 MG capsule TAKE 1 TO 2 CAPSULES(100 TO 200 MG) BY MOUTH TWICE DAILY 120 capsule 3  . glucagon (GLUCAGON EMERGENCY) 1 MG injection INJECT 1 MG INTO THE SKIN AT ONCE AS NEEDED 2 kit 5  . guaiFENesin (ROBITUSSIN) 100 MG/5ML SOLN Take 10 mLs (200 mg total) by mouth every 4 (four) hours as needed for cough or to loosen phlegm. 1200 mL 6  . insulin aspart (NOVOLOG) 100 UNIT/ML injection Sliding Scale 10 units in the am and 8 in the pm. 1-3 units as needed for high blood sugars. 10 mL 11  . insulin degludec (TRESIBA FLEXTOUCH) 100 UNIT/ML SOPN FlexTouch Pen Inject 0.15 mLs (15 Units total) into the skin daily. 15 pen 1  . lamoTRIgine (LAMICTAL) 100 MG tablet Take 1 tablet (100 mg total) by mouth 2 (two) times daily. 180 tablet 0  . lipase/protease/amylase (CREON) 36000 UNITS CPEP capsule Take 2 capsules (72,000 Units total) by mouth 3 (three) times daily before meals. 300 capsule 3  . loratadine (CLARITIN) 10 MG tablet Take 10 mg by mouth as needed for allergies.    Marland Kitchen lovastatin (MEVACOR) 40 MG tablet Take 1 tablet (40 mg total) by mouth at bedtime. 90 tablet 2  . Magnesium 500 MG TABS Take 1 tablet by mouth as needed.    . metoCLOPramide (REGLAN) 5 MG tablet TAKE 1 TABLET(5 MG) BY MOUTH THREE TIMES DAILY BEFORE MEALS 90 tablet 0  . Multiple Vitamin (MULTIVITAMIN) tablet Take 1 tablet by mouth daily. Reported on 11/22/2015    . omeprazole (PRILOSEC) 40 MG capsule Take 1 capsule (40 mg total) by mouth 2 (two) times daily. Annual appt due in Nov must see provider for future refills 60  capsule 2  . omeprazole (PRILOSEC) 40 MG capsule TAKE 1 CAPSULE(40 MG) BY MOUTH DAILY 90 capsule 0  . traMADol (ULTRAM) 50 MG tablet Take 1 tablet (50 mg total) by mouth every other day. 15 tablet 0  . trazodone (DESYREL) 300 MG tablet Take 1 tablet (300 mg total) by mouth at bedtime. 90 tablet 0  . vitamin A 10000 UNIT capsule Take 10,000 Units by mouth daily.    . vitamin E (VITAMIN E) 1000 UNIT capsule Take 1,000 Units by mouth daily.       No current facility-administered medications for this visit.      Musculoskeletal: Strength & Muscle Tone: within normal limits Gait & Station: normal Patient leans: N/A  Psychiatric Specialty Exam: Review of Systems  Constitutional: Positive for weight loss. Negative for chills, diaphoresis and fever.  HENT: Positive for congestion. Negative for sinus pain and sore throat.   Endo/Heme/Allergies: Positive for environmental allergies.    Blood pressure 102/66, pulse 90, height 5' 3.5" (1.613 m), weight 103 lb (46.7 kg).Body mass index is 17.96 kg/m.  General Appearance: Casual  Eye Contact:  Good  Speech:  Clear and Coherent and Normal Rate  Volume:  Normal  Mood:  Euthymic  Affect:  Full Range  Thought Process:  Goal Directed and Descriptions of Associations: Intact  Orientation:  Full (Time, Place, and Person)  Thought Content: Logical   Suicidal Thoughts:  No  Homicidal Thoughts:  No  Memory:  Immediate;   Good Recent;   Good Remote;   Good  Judgement:  Good  Insight:  Good  Psychomotor Activity:  Normal  Concentration:  Concentration: Good and Attention Span: Good  Recall:  Good  Fund of Knowledge: Good  Language: Good  Akathisia:  No  Handed:  Right  AIMS (if indicated): not done  Assets:  Communication Skills Desire for [place] Talents/Skills Transportation  ADL's:  Intact  Cognition: WNL  Sleep:  Good   Screenings: GAD-7     Office Visit from 11/27/2017 in [facility]  Total GAD-7 Score  18    Mini-Mental     Office Visit from 05/30/2017 in [facility]  Total Score (max 30 points )  27    PHQ2-9     Office Visit from 11/27/2017 in [facility] Visit from 05/30/2017 in [facility] Visit from 03/26/2017 in [facility] Office Visit from 04/13/2014 in [facility]  PHQ-2 Total Score  3  2  1   0  0  PHQ-9 Total Score  13  6  -  -  -      I reviewed the information below on 01/17/2018 and agree except where noted/changed Assessment and Plan: MDD-recurrent, moderate without psychotic features; GAD; social anxiety disorder; insomnia; rule out bipolar disorder     Medication management with supportive therapy. Risks and benefits, side effects and alternative treatment options discussed with patient. Pt was given an opportunity to ask questions about medication, illness, and treatment. All current psychiatric medications have been reviewed and discussed with the patient and adjusted as clinically appropriate. The patient has been provided an accurate and updated list of the medications being now prescribed. Patient expressed understanding of how their medications were to be used.  Pt verbalized understanding and verbal consent obtained for treatment.  The risk of un-intended pregnancy is low based on the fact that pt reports she is post menopausal Pt is aware that these meds carry a teratogenic risk. Pt will discuss plan of action if she does or plans to become pregnant in the future.  Status of current problems: improved  Meds:  Lamictal 100 mg p.o. twice daily for MDD Trazodone 300 mg p.o. nightly for MDD and insomnia D/c Buspar  Pt is prescribed Neurontin which has an off label indication for anxiety  Labs: reviewed 12/13/2017 labs- pt is  working with her PCP  Therapy: brief supportive therapy provided. Discussed psychosocial stressors in detail.    Consultations: none  Pt denies SI and is at an acute low risk for suicide. Patient told to call clinic if any problems occur. Patient advised to go to ER if they should develop SI/HI, side effects, or if symptoms worsen. Has crisis numbers to call if needed. Pt verbalized understanding.  F/up in 3 months or sooner if needed  The duration of this appointment visit was 20 minutes of face-to-face time with the patient.  Greater than 50% of this time was spent in counseling, explanation of  diagnosis, planning of further management, and coordination of care     Charlcie Cradle, MD 01/17/2018, 10:48 AM

## 2018-02-01 ENCOUNTER — Other Ambulatory Visit: Payer: Self-pay | Admitting: Internal Medicine

## 2018-02-01 NOTE — Telephone Encounter (Signed)
Please advise per Dr. Nathanial Millman absence  Control database checked last refill: 11/27/2017 LOV: 11/27/17 NOV: 06/04/2018

## 2018-02-01 NOTE — Telephone Encounter (Signed)
sent 

## 2018-02-15 ENCOUNTER — Other Ambulatory Visit: Payer: Self-pay | Admitting: Internal Medicine

## 2018-02-15 ENCOUNTER — Other Ambulatory Visit: Payer: Self-pay | Admitting: Endocrinology

## 2018-02-18 ENCOUNTER — Ambulatory Visit (INDEPENDENT_AMBULATORY_CARE_PROVIDER_SITE_OTHER): Payer: Medicare HMO | Admitting: Internal Medicine

## 2018-02-18 ENCOUNTER — Other Ambulatory Visit: Payer: Self-pay | Admitting: Internal Medicine

## 2018-02-18 ENCOUNTER — Ambulatory Visit (INDEPENDENT_AMBULATORY_CARE_PROVIDER_SITE_OTHER)
Admission: RE | Admit: 2018-02-18 | Discharge: 2018-02-18 | Disposition: A | Discharge status: Home / Self Care | Payer: Medicare HMO | Source: Ambulatory Visit | Attending: Internal Medicine | Admitting: Internal Medicine

## 2018-02-18 ENCOUNTER — Encounter: Payer: Self-pay | Admitting: Internal Medicine

## 2018-02-18 VITALS — BP 112/60 | HR 72 | Temp 98.0°F | Ht 63.5 in | Wt 106.0 lb

## 2018-02-18 DIAGNOSIS — R059 Cough, unspecified: Secondary | ICD-10-CM

## 2018-02-18 DIAGNOSIS — R0989 Other specified symptoms and signs involving the circulatory and respiratory systems: Secondary | ICD-10-CM | POA: Diagnosis not present

## 2018-02-18 DIAGNOSIS — R05 Cough: Secondary | ICD-10-CM

## 2018-02-18 DIAGNOSIS — M25511 Pain in right shoulder: Secondary | ICD-10-CM

## 2018-02-18 DIAGNOSIS — F17210 Nicotine dependence, cigarettes, uncomplicated: Secondary | ICD-10-CM

## 2018-02-18 HISTORY — DX: Pain in right shoulder: M25.511

## 2018-02-18 MED ORDER — LIDOCAINE 5 % EX PTCH: 1.0000 | MEDICATED_PATCH | CUTANEOUS | 0 refills | Status: DC

## 2018-02-18 NOTE — Assessment & Plan Note (Signed)
Checking CXR to rule out pneumonia. Prior CT chest 04/2017 without nodules or lesions. We talked about how smoking is likely the cause of her coughing and congestion. She is using mucinex but this is not helping. Given no SOB there is not an indication for antibiotics or steroids.

## 2018-02-18 NOTE — Assessment & Plan Note (Signed)
Suspect muscular injury. Will try lidoderm patches and if no improvement will refer to sports medicine for injection.

## 2018-02-18 NOTE — Patient Instructions (Signed)
We can check a chest x-ray today.  We have sent in patches to use on the shoulder.   We would like you to quit smoking and consider 14 mg nicotine patches to try. Call 1800-[email or web address] to get free patches.

## 2018-02-18 NOTE — Progress Notes (Signed)
   Subjective:    Patient ID: Leslie Lane, female    DOB: Sep 11, 1955, 61 y.o.   MRN: [id number]  HPI The patient is a 62 YO female coming in for several concerns including right arm and shoulder pain (started about 1 month ago with exercising, denies injury or falls, has rx for tramadol #15 per month, tried this for pain but it did not help, she normally takes this for chronic back pain, overall stable since onset, pain 8/10, denies numbness or weakness) and cough (feels like she has sputum in her chest which she cannot express, denies SOB, is a smoker and is still smoking more out of boredom, has no hobbies and smokes all day long) and smoking (wants to quit smoking, has tried wellbutrin long ago and it did not work, tried Proofreader and got some strange dreams, has not tried patches or gum).    Review of Systems  Constitutional: Positive for activity change. Negative for appetite change, chills, fatigue, fever and unexpected weight change.  HENT: Negative.   Eyes: Negative.   Respiratory: Positive for cough. Negative for chest tightness and shortness of breath.   Cardiovascular: Negative.  Negative for chest pain, palpitations and leg swelling.  Gastrointestinal: Negative.  Negative for abdominal distention, abdominal pain, constipation, diarrhea, nausea and vomiting.  Musculoskeletal: Positive for arthralgias, back pain and myalgias. Negative for gait problem, joint swelling, neck pain and neck stiffness.  Skin: Negative.   Neurological: Negative.   Psychiatric/Behavioral: Negative.       Objective:   Physical Exam  Constitutional: She is oriented to person, place, and time. She appears well-developed and well-nourished.  HENT:  Head: Normocephalic and atraumatic.  Eyes: EOM are normal.  Neck: Normal range of motion.  Cardiovascular: Normal rate and regular rhythm.  Pulmonary/Chest: Effort normal and breath sounds normal. No respiratory distress. She has no wheezes. She has no rales.    Abdominal: Soft. Bowel sounds are normal. She exhibits no distension. There is no tenderness. There is no rebound.  Musculoskeletal: She exhibits no edema.  Neurological: She is alert and oriented to person, place, and time. Coordination normal.  Skin: Skin is warm and dry.  Psychiatric: She has a normal mood and affect.   Vitals:   02/18/18 1314  BP: 112/60  Pulse: 72  Temp: 98 F (36.7 C)  TempSrc: Oral  SpO2: 93%  Weight: 106 lb (48.1 kg)  Height: 5' 3.5" (1.613 m)      Assessment & Plan:

## 2018-02-18 NOTE — Assessment & Plan Note (Signed)
Advised her to call 1800-QUIT-NOW for nicotine patches to try to quit. She is having likely COPD and we talked about how stopping smoking is the only way to help.

## 2018-03-06 DIAGNOSIS — E1065 Type 1 diabetes mellitus with hyperglycemia: Secondary | ICD-10-CM | POA: Diagnosis not present

## 2018-03-15 ENCOUNTER — Other Ambulatory Visit (INDEPENDENT_AMBULATORY_CARE_PROVIDER_SITE_OTHER): Payer: Medicare HMO

## 2018-03-15 DIAGNOSIS — E1065 Type 1 diabetes mellitus with hyperglycemia: Secondary | ICD-10-CM

## 2018-03-15 LAB — BASIC METABOLIC PANEL
BUN: 20 mg/dL (ref 6–23)
CHLORIDE: 100 meq/L (ref 96–112)
CO2: 32 meq/L (ref 19–32)
CREATININE: 0.83 mg/dL (ref 0.40–1.20)
Calcium: 9.7 mg/dL (ref 8.4–10.5)
GFR: 73.58 mL/min (ref >60.00)
Glucose, Bld: 138 mg/dL — ABNORMAL HIGH (ref 70–99)
Potassium: 4.7 mEq/L (ref 3.5–5.1)
Sodium: 138 mEq/L (ref 135–145)

## 2018-03-15 LAB — HEMOGLOBIN A1C: Hgb A1c MFr Bld: 6.9 % — ABNORMAL HIGH (ref 4.6–6.5)

## 2018-03-19 ENCOUNTER — Ambulatory Visit: Payer: Self-pay | Admitting: Endocrinology

## 2018-03-19 ENCOUNTER — Ambulatory Visit: Payer: Medicare HMO | Admitting: Endocrinology

## 2018-03-19 ENCOUNTER — Encounter: Payer: Self-pay | Admitting: Endocrinology

## 2018-03-19 VITALS — BP 118/62 | HR 78 | Temp 98.0°F | Resp 18 | Ht 64.0 in | Wt 105.4 lb

## 2018-03-19 DIAGNOSIS — E1065 Type 1 diabetes mellitus with hyperglycemia: Secondary | ICD-10-CM | POA: Diagnosis not present

## 2018-03-19 DIAGNOSIS — E049 Nontoxic goiter, unspecified: Secondary | ICD-10-CM | POA: Diagnosis not present

## 2018-03-19 MED ORDER — INSULIN ASPART 100 UNIT/ML FLEXPEN: PEN_INJECTOR | SUBCUTANEOUS | 3 refills | Status: DC

## 2018-03-19 MED ORDER — GLUCAGON (RDNA) 1 MG IJ KIT: PACK | INTRAMUSCULAR | 5 refills | Status: DC

## 2018-03-19 NOTE — Patient Instructions (Signed)
Snack at 9 pm  No Novolog unless sugar is >250 with food

## 2018-03-19 NOTE — Progress Notes (Signed)
Patient ID: Leslie Lane, female   DOB: Sep 18, 1955, 62 y.o.   MRN: [id number]   Reason for Appointment: Diabetes follow-up   History of Present Illness    Diagnosis: Type 1 DIABETES MELITUS, diagnosed 1967      She has had labile blood sugar control over the last several years even though A1c has been usually around 7% She has had less lability and hypoglycemia with taking b.i.d. Lantus compared once a day She has been very sensitive to fast acting insulin and frequently does not require mealtime coverage She cannot tolerate Toujeo because of reported episode of headache, bloating and nausea with the first dose  RECENT history:  Insulin regimen: Tresiba 7 units daily, Novolog 1-2 units up to 4 times a day   Her A1c tends to be in the 6-7 range and now 6.9  Current blood sugar patterns and problems identified:  She  has restarted using a Dexcom sensor but has had this on only for the last week or so  She appears to have had lost signals at times  Analysis of her Dexcom is shown below  However because of variability in her blood sugars no consistent patterns are seen  Again she is having tendency to hypoglycemia when she is more active which is frequently late in the evening  Also she is still tending to have hypoglycemia when she takes even 1 unit of NovoLog including this morning even though her blood sugar was not over 200 and she had a small amount of carbohydrate  She forgets to take a snack before being more active  However overall trying to reduce hypoglycemia with taking a couple of glucose tablets when her blood sugars are starting to get low especially when she is going shopping  Most of her high sugar episodes are in the morning hours and not clear why she had a couple of prolonged high sugars through the night possibly from higher fat intake the night before  HYPOGLYCEMIA: As above, may not have symptoms with low blood sugars and does not have early  warning symptoms. Gets confused frequently and she depends on her mother to recognize low sugars and treat them. Her mother knows how to give Glucagon injection She will treat her low blood sugars usually with juice, does not like glucose tablets; sometimes will eat food at the same time      DIET: She is trying to get protein with eggs at breakfast Meal times: Variable.Marland Kitchen  She will eat more when she is anxious  Monitors blood glucose:  about 4-5 times a day.    Glucometer: Dexcom/  Accu-Chek Aviva    CONTINUOUS GLUCOSE MONITORING RECORD INTERPRETATION    Dates of Recording: 03/12/2018 through 03/19/2018  Sensor  summary: Blood sugar analyzed for 8 days with somewhat incomplete recording of data overnight  Results statistics:   CGM use % of time  73  Average and SD  143+/-71  Time in range     62   %  % Time Above 180  27  % Time above 250  8  % Time Below 70/54  11.5/5.8    Glycemic patterns summary:  Blood sugars were variable overnight, hypoglycemia tending to occur after breakfast on several days and only randomly after lunch and dinner.  Overall no consistent pattern.  Also at least 3 episodes of hypoglycemia  Hyperglycemic episodes appear to be through the night on 2 occasions, after breakfast at least on 3 occasions and afternoon  and evening-her episode occurring on 9/10 and 9/12  Hypoglycemic episodes occurred transiently before lunch and supper and twice after about 9 PM  Overnight periods: Blood sugars were normal to slightly low on 2 occasions but averaging about 250 on 2 other nights  Preprandial periods: Significantly variable but mostly high at breakfast, less so at lunchtime and suppertime  Postprandial periods: As above blood sugars were randomly high after breakfast but only occasionally after lunch and supper  ACCU-CHEK readings:   checking blood sugar 4-6 times a day  FASTING range 77-264, AVERAGE 180 Overall AVERAGE 143 with standard deviation is  68 Average midday 118, afternoon 160 and bedtime 127   Wt Readings from Last 3 Encounters:  03/19/18 105 lb 6.4 oz (47.8 kg)  02/18/18 106 lb (48.1 kg)  12/17/17 103 lb 12.8 oz (47.1 kg)    Lab Results  Component Value Date   HGBA1C 6.9 (H) 03/15/2018   HGBA1C 6.6 (H) 12/13/2017   HGBA1C 6.1 07/27/2017   Lab Results  Component Value Date   MICROALBUR 0.9 12/13/2017   LDLCALC 70 07/27/2017   CREATININE 0.83 03/15/2018        Allergies as of 03/19/2018      Reactions   Penicillins Anaphylaxis   Sulfonamide Derivatives Anaphylaxis      Medication List        Accurate as of 03/19/18  1:49 PM. Always use your most recent med list.          ACCU-CHEK AVIVA PLUS test strip Generic drug:  glucose blood TEST BLOOD SUGAR FOUR TIMES DAILY   ACCU-CHEK AVIVA PLUS w/Device Kit USE AS DIRECTED   ACCU-CHEK AVIVA Soln USE AS DIRECTED   ACCU-CHEK SOFTCLIX LANCETS lancets Use as instructed to check blood sugar 4 times per day dx code E10.65   aspirin EC 81 MG tablet Take 81 mg by mouth every morning.   B-D SINGLE USE SWABS REGULAR Pads USE FOUR TIMES DAILY   B-D ULTRAFINE III SHORT PEN 31G X 8 MM Misc Generic drug:  Insulin Pen Needle USE TO INJECT INSULIN DAILY.   diclofenac sodium 1 % Gel Commonly known as:  VOLTAREN APPLY 2 GRAMS EXTERNALLY TO THE AFFECTED AREA FOUR TIMES DAILY   Fish Oil 1000 MG Caps Take 1 capsule by mouth daily.   gabapentin 100 MG capsule Commonly known as:  NEURONTIN TAKE 1 TO 2 CAPSULES BY MOUTH TWICE DAILY   glucagon 1 MG injection INJECT 1 MG INTO THE SKIN AT ONCE AS NEEDED   insulin aspart 100 UNIT/ML injection Commonly known as:  novoLOG Sliding Scale 10 units in the am and 8 in the pm. 1-3 units as needed for high blood sugars.   insulin degludec 100 UNIT/ML Sopn FlexTouch Pen Commonly known as:  TRESIBA Inject 0.15 mLs (15 Units total) into the skin daily.   lamoTRIgine 100 MG tablet Commonly known as:  LAMICTAL Take 1  tablet (100 mg total) by mouth 2 (two) times daily.   lidocaine 5 % Commonly known as:  LIDODERM Place 1 patch onto the skin daily. Remove & Discard patch within 12 hours or as directed by MD   lipase/protease/amylase 36000 UNITS Cpep capsule Commonly known as:  CREON Take 2 capsules (72,000 Units total) by mouth 3 (three) times daily before meals.   loratadine 10 MG tablet Commonly known as:  CLARITIN Take 10 mg by mouth as needed for allergies.   lovastatin 40 MG tablet Commonly known as:  MEVACOR Take 1 tablet (  40 mg total) by mouth at bedtime.   Magnesium 500 MG Tabs Take 1 tablet by mouth as needed.   metoCLOPramide 5 MG tablet Commonly known as:  REGLAN TAKE 1 TABLET THREE TIMES DAILY BEFORE MEALS   multivitamin tablet Take 1 tablet by mouth daily. Reported on 11/22/2015   omeprazole 40 MG capsule Commonly known as:  PRILOSEC TAKE 1 CAPSULE(40 MG) BY MOUTH DAILY   traMADol 50 MG tablet Commonly known as:  ULTRAM TAKE 1 TABLET BY MOUTH EVERY OTHER DAY   trazodone 300 MG tablet Commonly known as:  DESYREL Take 1 tablet (300 mg total) by mouth at bedtime.   vitamin A 10000 UNIT capsule Take 10,000 Units by mouth daily.   vitamin B-12 1000 MCG tablet Commonly known as:  CYANOCOBALAMIN Take 1 tablet by mouth daily.   vitamin E 1000 UNIT capsule Generic drug:  vitamin E Take 1,000 Units by mouth daily.       Allergies:  Allergies  Allergen Reactions  . Penicillins Anaphylaxis  . Sulfonamide Derivatives Anaphylaxis    Past Medical History:  Diagnosis Date  . ALLERGIC RHINITIS   . Allergy   . Anemia   . Anxiety   . Arthritis   . BIPOLAR AFFECTIVE DISORDER   . Cataract   . DEPRESSION   . Diabetes mellitus type I ([place])   . DIABETES MELLITUS, TYPE II    follows with endo  . Fibromyalgia   . GERD   . HEPATITIS B   . HYPERLIPIDEMIA   . LOW BACK PAIN, CHRONIC   . MIGRAINE HEADACHE   . NARCOTIC ABUSE    hx of  . NECK PAIN, CHRONIC   .  Neuropathy   . Seizures ([place])    pt states, "if my blood sugar drops to the 20's, I convulse.  It hasn't happened in a long time."  . SMOKER     Past Surgical History:  Procedure Laterality Date  . CESAREAN SECTION    . COLONOSCOPY    . TUBAL LIGATION    . UPPER GASTROINTESTINAL ENDOSCOPY      Family History  Problem Relation Age of Onset  . Arthritis Mother   . Colon cancer Mother        ? age of dx  . Arthritis Father   . Kidney disease Father   . Kidney cancer Father   . Heart attack Brother   . Bladder Cancer Sister   . Hyperlipidemia Other   . Kidney cancer Paternal Aunt   . Anxiety disorder Neg Hx   . Bipolar disorder Neg Hx   . Depression Neg Hx   . Breast cancer Neg Hx   . Esophageal cancer Neg Hx   . Stomach cancer Neg Hx   . Rectal cancer Neg Hx     Social History:  reports that she has been smoking cigarettes. She has a 60.00 pack-year smoking history. She has never used smokeless tobacco. She reports that she drinks alcohol. She reports that she does not use drugs.  Review of Systems:   WEIGHT history:     Wt Readings from Last 3 Encounters:  03/19/18 105 lb 6.4 oz (47.8 kg)  02/18/18 106 lb (48.1 kg)  12/17/17 103 lb 12.8 oz (47.1 kg)    Lab Results  Component Value Date   TSH 0.84 03/20/2017    Has probable gastroparesis treated with Reglan  NEUROPATHY: She has burning in her legs and feet at times treated with gabapentin  DEPRESSION: She has had long-standing depression and anxiety with variable level of control and is on Lamictal   HYPERLIPIDEMIA: The lipid abnormality consists of elevated LDL   Is  on lovastatin 40 mg with  good control   Lab Results  Component Value Date   CHOL 159 07/27/2017   HDL 79.90 07/27/2017   LDLCALC 70 07/27/2017   LDLDIRECT 101.7 03/26/2012   TRIG 48.0 07/27/2017   CHOLHDL 2 07/27/2017    Memory difficulties: She has not been given specific advice by PCP   Examination:   BP 118/62 (BP  Location: Left Arm, Cuff Size: Normal)   Pulse 78   Temp 98 F (36.7 C) (Oral)   Resp 18   Ht _0  (1.626 m)   Wt 105 lb 6.4 oz (47.8 kg)   BMI 18.09 kg/m   Body mass index is 18.09 kg/m.    ASSESSMENT/ PLAN:   Diabetes type 1 with consistently poor control  See history of present illness for detailed discussion of current insulin, blood sugar patterns, problems identified  A1c has been consistently fairly good and now 6.9  Although she has had tendency to hypoglycemia especially when she is more active and not preventing low sugars in the evenings and sometimes during the day she has had somewhat less frequent low sugar episodes compared to previous times when she is using the Dexcom sensor consistently Greater part of the time of her visit today was counseling her on prevention of hypoglycemia with increased activity as well as not taking any mealtime insulin unless her blood sugars are very high or rising with large amounts of carbohydrate or snacks Her blood sugars are not low overnight and her dose of Tresiba of 7 units appear to be appropriate  With her difficulty complying with diet and eating snacks or high carbohydrate foods when she is anxious blood sugar will continue to be variable  Discussed range of hypoglycemia and she knows what factors make the sugar go low She will need to avoid taking NovoLog such as this morning blood sugars are only modestly high and she is not eating much carbohydrate She will also discuss technical issues of the Dexcom with the nurse educator and try to use this more consistently to help avoid hypoglycemia  Memory loss: Would recommend that she be referred to the neurologist for full assessment  There are no Patient Instructions on file for this visit.  Counseling time on subjects discussed in assessment and plan sections is over 50% of today's 25 minute visit   [name] 03/19/2018, 1:49 PM

## 2018-04-05 DIAGNOSIS — E1065 Type 1 diabetes mellitus with hyperglycemia: Secondary | ICD-10-CM | POA: Diagnosis not present

## 2018-04-18 ENCOUNTER — Ambulatory Visit (HOSPITAL_COMMUNITY): Payer: Medicare HMO | Admitting: Psychiatry

## 2018-04-18 ENCOUNTER — Other Ambulatory Visit: Payer: Self-pay

## 2018-04-18 ENCOUNTER — Encounter (HOSPITAL_COMMUNITY): Payer: Self-pay | Admitting: Psychiatry

## 2018-04-18 DIAGNOSIS — F4010 Social phobia, unspecified: Secondary | ICD-10-CM | POA: Diagnosis not present

## 2018-04-18 DIAGNOSIS — G4701 Insomnia due to medical condition: Secondary | ICD-10-CM | POA: Diagnosis not present

## 2018-04-18 DIAGNOSIS — F332 Major depressive disorder, recurrent severe without psychotic features: Secondary | ICD-10-CM

## 2018-04-18 MED ORDER — LAMOTRIGINE 100 MG PO TABS: 100.0000 mg | ORAL_TABLET | Freq: Two times a day (BID) | ORAL | 0 refills | Status: DC

## 2018-04-18 MED ORDER — TRAZODONE HCL 300 MG PO TABS: 300.0000 mg | ORAL_TABLET | Freq: Every day | ORAL | 0 refills | Status: DC

## 2018-04-18 NOTE — Progress Notes (Signed)
BH MD/PA/NP OP Progress Note  04/18/2018 3:20 PM Leslie Lane  MRN:  [id number]  Chief Complaint:  Chief Complaint    Follow-up     HPI: "I guess all right".  She tells me that her fatigue continues despite good sleep.  Her anxiety is manageable.  It is more of an issue when she is around others.  Leslie Lane has a few friends who she keeps in touch with.  On Fridays her 2 sisters and brother come over and patient hates it.  She states that she is easily irritated by everything and everyone.  She prefers to go out as she is easily bored.  Leslie Lane reports poor concentration- unable to follow tv shows or news. She states that she spends on her credit card until it is maxed out.  She then make small payments and continues to shop.  She said her depression is ongoing and really unchanged.  No meds have really helped her in the past and she does not want any meds changed today.  She does not think the Lamictal is helping.  She states when she is bored she will have passive thoughts of death.  She denies SI/HI.  Visit Diagnosis:    ICD-10-CM   1. Major depressive disorder, recurrent, severe without psychotic features (HCC) F33.2 lamoTRIgine (LAMICTAL) 100 MG tablet    trazodone (DESYREL) 300 MG tablet  2. Social anxiety disorder F40.10 lamoTRIgine (LAMICTAL) 100 MG tablet  3. Insomnia due to medical condition G47.01 trazodone (DESYREL) 300 MG tablet      Past Psychiatric History:  Anxiety: Yes Bipolar Disorder: No Depression: Yes Mania: No Psychosis: No Schizophrenia: No Personality Disorder: No  Hospitalization for psychiatric illness: No History of Electroconvulsive Shock Therapy: No Prior Suicide Attempts: Yes  Seroquel, Trazodone-ineffective   Lexapro   Depakote   Effexor and Cymbalta- ineffective   Wellbutrin- ineffective   Abilify- ineffective   Paxil-effective but not the second time it was prescribed          Past Medical History:  Past Medical History:  Diagnosis Date  .  ALLERGIC RHINITIS   . Allergy   . Anemia   . Anxiety   . Arthritis   . BIPOLAR AFFECTIVE DISORDER   . Cataract   . DEPRESSION   . Diabetes mellitus type I ([place])   . DIABETES MELLITUS, TYPE II    follows with endo  . Fibromyalgia   . GERD   . HEPATITIS B   . HYPERLIPIDEMIA   . LOW BACK PAIN, CHRONIC   . MIGRAINE HEADACHE   . NARCOTIC ABUSE    hx of  . NECK PAIN, CHRONIC   . Neuropathy   . Seizures ([place])    pt states, "if my blood sugar drops to the 20's, I convulse.  It hasn't happened in a long time."  . SMOKER     Past Surgical History:  Procedure Laterality Date  . CESAREAN SECTION    . COLONOSCOPY    . TUBAL LIGATION    . UPPER GASTROINTESTINAL ENDOSCOPY      Family Psychiatric History:  Family History  Problem Relation Age of Onset  . Arthritis Mother   . Colon cancer Mother        ? age of dx  . Arthritis Father   . Kidney disease Father   . Kidney cancer Father   . Heart attack Brother   . Bladder Cancer Sister   . Hyperlipidemia Other   . Kidney cancer Paternal Aunt   .  Anxiety disorder Neg Hx   . Bipolar disorder Neg Hx   . Depression Neg Hx   . Breast cancer Neg Hx   . Esophageal cancer Neg Hx   . Stomach cancer Neg Hx   . Rectal cancer Neg Hx     Social History:  Social History   Socioeconomic History  . Marital status: Divorced    Spouse name: Not on file  . Number of children: 1  . Years of education: Not on file  . Highest education level: Not on file  Occupational History  . Occupation: diabled  Social Needs  . Financial resource strain: Not on file  . Food insecurity:    Worry: Not on file    Inability: Not on file  . Transportation needs:    Medical: Not on file    Non-medical: Not on file  Tobacco Use  . Smoking status: Current Every Day Smoker    Packs/day: 1.50    Years: 40.00    Pack years: 60.00    Types: Cigarettes  . Smokeless tobacco: Never Used  . Tobacco comment: tobacco info given 04/18/16  Substance and  Sexual Activity  . Alcohol use: Yes    Alcohol/week: 0.0 standard drinks    Comment: occasionally- once every 2 weeks 1-2 beers  . Drug use: No  . Sexual activity: Never    Birth control/protection: Surgical, Abstinence  Lifestyle  . Physical activity:    Days per week: Not on file    Minutes per session: Not on file  . Stress: Not on file  Relationships  . Social connections:    Talks on phone: Not on file    Gets together: Not on file    Attends religious service: Not on file    Active member of club or organization: Not on file    Attends meetings of clubs or organizations: Not on file    Relationship status: Not on file  Other Topics Concern  . Not on file  Social History Narrative   Divorced, disabled   Lives with parents    Allergies:  Allergies  Allergen Reactions  . Penicillins Anaphylaxis  . Sulfonamide Derivatives Anaphylaxis    Metabolic Disorder Labs: Lab Results  Component Value Date   HGBA1C 6.9 (H) 03/15/2018   No results found for: PROLACTIN Lab Results  Component Value Date   CHOL 159 07/27/2017   TRIG 48.0 07/27/2017   HDL 79.90 07/27/2017   CHOLHDL 2 07/27/2017   VLDL 9.6 07/27/2017   LDLCALC 70 07/27/2017   LDLCALC 84 10/20/2016   Lab Results  Component Value Date   TSH 0.84 03/20/2017   TSH 1.38 10/20/2016    Therapeutic Level Labs: No results found for: LITHIUM No results found for: VALPROATE No components found for:  CBMZ  Current Medications: Current Outpatient Medications  Medication Sig Dispense Refill  . ACCU-CHEK AVIVA PLUS test strip TEST BLOOD SUGAR FOUR TIMES DAILY 400 each 3  . ACCU-CHEK SOFTCLIX LANCETS lancets Use as instructed to check blood sugar 4 times per day dx code E10.65 (Patient taking differently: Use as instructed to check blood sugar 8 times per day dx code E10.65) 400 each 1  . Alcohol Swabs (B-D SINGLE USE SWABS REGULAR) PADS USE FOUR TIMES DAILY 400 each 2  . aspirin EC 81 MG tablet Take 81 mg by mouth  every morning.    . B-D ULTRAFINE III SHORT PEN 31G X 8 MM MISC USE TO INJECT INSULIN DAILY. 100 each 0  .  Blood Glucose Calibration (ACCU-CHEK AVIVA) SOLN USE AS DIRECTED 1 each 1  . Blood Glucose Monitoring Suppl (ACCU-CHEK AVIVA PLUS) w/Device KIT USE AS DIRECTED 1 kit 0  . diclofenac sodium (VOLTAREN) 1 % GEL APPLY 2 GRAMS EXTERNALLY TO THE AFFECTED AREA FOUR TIMES DAILY 100 g 0  . glucagon (GLUCAGON EMERGENCY) 1 MG injection INJECT 1 MG INTO THE SKIN AT ONCE AS NEEDED 2 kit 5  . insulin aspart (NOVOLOG FLEXPEN) 100 UNIT/ML FlexPen Sliding Scale 10 units in the am and 8 in the pm. 1-3 units as needed for high blood sugars. 15 mL 3  . insulin degludec (TRESIBA FLEXTOUCH) 100 UNIT/ML SOPN FlexTouch Pen Inject 0.15 mLs (15 Units total) into the skin daily. 15 pen 1  . lamoTRIgine (LAMICTAL) 100 MG tablet Take 1 tablet (100 mg total) by mouth 2 (two) times daily. 180 tablet 0  . lidocaine (LIDODERM) 5 % Place 1 patch onto the skin daily. Remove & Discard patch within 12 hours or as directed by MD 30 patch 0  . lipase/protease/amylase (CREON) 36000 UNITS CPEP capsule Take 2 capsules (72,000 Units total) by mouth 3 (three) times daily before meals. 300 capsule 3  . loratadine (CLARITIN) 10 MG tablet Take 10 mg by mouth as needed for allergies.    Marland Kitchen lovastatin (MEVACOR) 40 MG tablet Take 1 tablet (40 mg total) by mouth at bedtime. 90 tablet 2  . Magnesium 500 MG TABS Take 1 tablet by mouth as needed.    . Multiple Vitamin (MULTIVITAMIN) tablet Take 1 tablet by mouth daily. Reported on 11/22/2015    . Omega-3 Fatty Acids (FISH OIL) 1000 MG CAPS Take 1 capsule by mouth daily.    Marland Kitchen omeprazole (PRILOSEC) 40 MG capsule TAKE 1 CAPSULE(40 MG) BY MOUTH DAILY 90 capsule 0  . traMADol (ULTRAM) 50 MG tablet TAKE 1 TABLET BY MOUTH EVERY OTHER DAY 15 tablet 0  . trazodone (DESYREL) 300 MG tablet Take 1 tablet (300 mg total) by mouth at bedtime. 90 tablet 0  . vitamin A 10000 UNIT capsule Take 10,000 Units by mouth  daily.    . vitamin B-12 (CYANOCOBALAMIN) 1000 MCG tablet Take 1 tablet by mouth daily.    . vitamin E (VITAMIN E) 1000 UNIT capsule Take 1,000 Units by mouth daily.      Marland Kitchen gabapentin (NEURONTIN) 100 MG capsule TAKE 1 TO 2 CAPSULES BY MOUTH TWICE DAILY (Patient not taking: Reported on 03/19/2018) 120 capsule 2  . metoCLOPramide (REGLAN) 5 MG tablet TAKE 1 TABLET THREE TIMES DAILY BEFORE MEALS 270 tablet 1   No current facility-administered medications for this visit.      Musculoskeletal: Strength & Muscle Tone: within normal limits Gait & Station: normal Patient leans: N/A  Psychiatric Specialty Exam: Review of Systems  Constitutional: Positive for malaise/fatigue. Negative for chills and fever.  Psychiatric/Behavioral: Positive for depression. Negative for suicidal ideas. The patient is not nervous/anxious and does not have insomnia.     Blood pressure (!) 157/73, pulse 67, resp. rate 18, height 5' 3.5" (1.613 m), weight 105 lb (47.6 kg).Body mass index is 18.31 kg/m.  General Appearance: Casual  Eye Contact:  Good  Speech:  Clear and Coherent and Normal Rate  Volume:  Normal  Mood:  Depressed  Affect:  Congruent  Thought Process:  Goal Directed and Descriptions of Associations: Intact  Orientation:  Full (Time, Place, and Person)  Thought Content:  Logical  Suicidal Thoughts:  No  Homicidal Thoughts:  No  Memory:  Immediate;   Good  Judgement:  Fair  Insight:  Present  Psychomotor Activity:  Normal  Concentration:  Concentration: Fair  Recall:  [place] of Knowledge:  Good  Language:  Good  Akathisia:  No  Handed:  Right  AIMS (if indicated):     Assets:  Chartered certified accountant  ADL's:  Intact  Cognition:  WNL  Sleep:         Screenings: GAD-7     Office Visit from 11/27/2017 in [place]  Total GAD-7 Score  18    Mini-Mental     Office Visit from 05/30/2017 in [facility]  Total  Score (max 30 points )  27    PHQ2-9     Office Visit from 11/27/2017 in [place] Visit from 05/30/2017 in [facility] Visit from 03/26/2017 in [facility] Office Visit from 04/13/2014 in [facility]  PHQ-2 Total Score  3  2  1   0  0  PHQ-9 Total Score  13  6  -  -  -     Reviewed the information below on 04/18/2018 and have updated it Assessment and Plan: MDD-recurrent, moderate without psychotic features; GAD; social anxiety disorder; insomnia; rule out bipolar disorder     Medication management with supportive therapy. Risks and benefits, side effects and alternative treatment options discussed with patient. Pt was given an opportunity to ask questions about medication, illness, and treatment. All current psychiatric medications have been reviewed and discussed with the patient and adjusted as clinically appropriate. The patient has been provided an accurate and updated list of the medications being now prescribed. Patient expressed understanding of how their medications were to be used.  Pt verbalized understanding and verbal consent obtained for treatment.  The risk of un-intended pregnancy is low based on the fact that pt reports she is post menopausal Pt is aware that these meds carry a teratogenic risk. Pt will discuss plan of action if she does or plans to become pregnant in the future.  Status of current problems: unchanged  Meds:  Lamictal 100 mg p.o. twice daily for MDD Trazodone 300 mg p.o. nightly for MDD and insomnia Pt is prescribed Neurontin which has an off label indication for anxiety Pt does not want meds changed at this time   Labs: reviewed 12/13/2017 labs- pt is working with her PCP  Therapy: brief supportive therapy provided. Discussed psychosocial stressors in detail.     Consultations: none  Pt denies SI and is at an acute low risk for suicide. Patient told to call clinic if any problems occur. Patient advised to go to ER if they should develop SI/HI, side effects, or if symptoms worsen. Has crisis numbers to call if needed. Pt verbalized understanding.  F/up in 3 months or sooner if needed  The duration of this appointment visit was 20 minutes of face-to-face time with the patient.  Greater than 50% of this time was spent in counseling, explanation of  diagnosis, planning of further management, and coordination of care     Charlcie Cradle, MD 04/18/2018, 3:20 PM

## 2018-06-03 ENCOUNTER — Telehealth: Payer: Self-pay | Admitting: Endocrinology

## 2018-06-03 ENCOUNTER — Other Ambulatory Visit: Payer: Self-pay

## 2018-06-03 MED ORDER — GLUCAGON (RDNA) 1 MG IJ KIT: PACK | INTRAMUSCULAR | 5 refills | Status: DC

## 2018-06-03 NOTE — Telephone Encounter (Signed)
RX sent to pharmacy per pt request

## 2018-06-03 NOTE — Telephone Encounter (Signed)
glucagon (GLUCAGON EMERGENCY) 1 MG injection   Patient stated she is needing this sent into the pharmacy.  She does not have any of this left    [place], [place]

## 2018-06-04 ENCOUNTER — Ambulatory Visit (INDEPENDENT_AMBULATORY_CARE_PROVIDER_SITE_OTHER): Payer: Medicare HMO | Admitting: Internal Medicine

## 2018-06-04 ENCOUNTER — Encounter: Payer: Self-pay | Admitting: Internal Medicine

## 2018-06-04 ENCOUNTER — Other Ambulatory Visit (INDEPENDENT_AMBULATORY_CARE_PROVIDER_SITE_OTHER): Payer: Medicare HMO

## 2018-06-04 VITALS — BP 150/80 | HR 63 | Temp 97.9°F | Ht 63.5 in | Wt 105.0 lb

## 2018-06-04 DIAGNOSIS — E440 Moderate protein-calorie malnutrition: Secondary | ICD-10-CM

## 2018-06-04 DIAGNOSIS — E782 Mixed hyperlipidemia: Secondary | ICD-10-CM

## 2018-06-04 DIAGNOSIS — J410 Simple chronic bronchitis: Secondary | ICD-10-CM

## 2018-06-04 DIAGNOSIS — E1069 Type 1 diabetes mellitus with other specified complication: Secondary | ICD-10-CM | POA: Diagnosis not present

## 2018-06-04 DIAGNOSIS — E538 Deficiency of other specified B group vitamins: Secondary | ICD-10-CM | POA: Diagnosis not present

## 2018-06-04 DIAGNOSIS — Z0000 Encounter for general adult medical examination without abnormal findings: Secondary | ICD-10-CM

## 2018-06-04 DIAGNOSIS — Z23 Encounter for immunization: Secondary | ICD-10-CM

## 2018-06-04 LAB — CBC
HCT: 38.5 % (ref 36.0–46.0)
Hemoglobin: 12.9 g/dL (ref 12.0–15.0)
MCHC: 33.4 g/dL (ref 30.0–36.0)
MCV: 85.1 fl (ref 78.0–100.0)
Platelets: 259 10*3/uL (ref 150.0–400.0)
RBC: 4.53 Mil/uL (ref 3.87–5.11)
RDW: 15.8 % — ABNORMAL HIGH (ref 11.5–15.5)
WBC: 3.8 10*3/uL — ABNORMAL LOW (ref 4.0–10.5)

## 2018-06-04 LAB — COMPREHENSIVE METABOLIC PANEL
ALT: 12 U/L (ref 0–35)
AST: 17 U/L (ref 0–37)
Albumin: 4.6 g/dL (ref 3.5–5.2)
Alkaline Phosphatase: 47 U/L (ref 39–117)
BUN: 16 mg/dL (ref 6–23)
CHLORIDE: 98 meq/L (ref 96–112)
CO2: 31 mEq/L (ref 19–32)
Calcium: 9.7 mg/dL (ref 8.4–10.5)
Creatinine, Ser: 0.74 mg/dL (ref 0.40–1.20)
GFR: 83.95 mL/min (ref >60.00)
Glucose, Bld: 208 mg/dL — ABNORMAL HIGH (ref 70–99)
Potassium: 3.8 mEq/L (ref 3.5–5.1)
SODIUM: 137 meq/L (ref 135–145)
Total Bilirubin: 0.4 mg/dL (ref 0.2–1.2)
Total Protein: 7.3 g/dL (ref 6.0–8.3)

## 2018-06-04 LAB — VITAMIN D 25 HYDROXY (VIT D DEFICIENCY, FRACTURES): VITD: 38.26 ng/mL (ref 30.00–100.00)

## 2018-06-04 LAB — VITAMIN B12: VITAMIN B 12: 916 pg/mL — AB (ref 211–911)

## 2018-06-04 LAB — LIPID PANEL
Cholesterol: 177 mg/dL (ref 0–200)
HDL: 87.8 mg/dL (ref >39.00)
LDL Cholesterol: 81 mg/dL (ref 0–99)
NONHDL: 89.64
Total CHOL/HDL Ratio: 2
Triglycerides: 45 mg/dL (ref 0.0–149.0)
VLDL: 9 mg/dL (ref 0.0–40.0)

## 2018-06-04 LAB — TSH: TSH: 2.59 u[IU]/mL (ref 0.35–4.50)

## 2018-06-04 MED ORDER — OMEPRAZOLE 40 MG PO CPDR: DELAYED_RELEASE_CAPSULE | ORAL | 3 refills | Status: DC

## 2018-06-04 MED ORDER — LOVASTATIN 40 MG PO TABS: 40.0000 mg | ORAL_TABLET | Freq: Every day | ORAL | 3 refills | Status: DC

## 2018-06-04 NOTE — Patient Instructions (Signed)

## 2018-06-04 NOTE — Assessment & Plan Note (Signed)
Flu shot given. Pneumonia up to date. Shingrix counseled. Tetanus up to date. Colonoscopy up to date. Mammogram up to date, pap smear needed asked to return to gyn and dexa up to date. Counseled about sun safety and mole surveillance. Counseled about the dangers of distracted driving. Given 10 year screening recommendations.

## 2018-06-04 NOTE — Assessment & Plan Note (Signed)
Weight is stable to mild loss compared to last year. She does have some absorption issues. She is encouraged to add nutrition to her diet and consider some glucerna.

## 2018-06-04 NOTE — Progress Notes (Signed)
   Subjective:    Patient ID: Leslie Lane, female    DOB: 09/07/1955, 62 y.o.   MRN: [id number]  HPI Here for medicare wellness and physical, no new complaints. Please see A/P for status and treatment of chronic medical problems.   Diet: DM since diabetic Physical activity: sedentary Depression/mood screen: negative, sees psych Hearing: intact to whispered voice Visual acuity: grossly normal, performs annual eye exam  ADLs: capable Fall risk: low Home safety: good Cognitive evaluation: intact to orientation, naming, recall and repetition EOL planning: adv directives discussed  I have personally reviewed and have noted 1. The patient's medical and social history - reviewed today no changes 2. Their use of alcohol, tobacco or illicit drugs 3. Their current medications and supplements 4. The patient's functional ability including ADL's, fall risks, home safety risks and hearing or visual impairment. 5. Diet and physical activities 6. Evidence for depression or mood disorders 7. Care team reviewed and updated (available in snapshot)  Review of Systems  Constitutional: Positive for fatigue.  HENT: Negative.   Eyes: Negative.   Respiratory: Positive for cough. Negative for chest tightness and shortness of breath.        Chronic  Cardiovascular: Negative for chest pain, palpitations and leg swelling.  Gastrointestinal: Positive for abdominal pain and constipation. Negative for abdominal distention, diarrhea, nausea and vomiting.  Musculoskeletal: Positive for arthralgias, myalgias and neck pain.  Skin: Negative.   Neurological: Negative.   Psychiatric/Behavioral: Negative.       Objective:   Physical Exam  Constitutional: She is oriented to person, place, and time. She appears well-developed and well-nourished.  thin  HENT:  Head: Normocephalic and atraumatic.  Eyes: EOM are normal.  Neck: Normal range of motion.  Cardiovascular: Normal rate and regular rhythm.    Pulmonary/Chest: Effort normal. No respiratory distress. She has wheezes. She has no rales.  Stable lung exam  Abdominal: Soft. Bowel sounds are normal. She exhibits no distension. There is no tenderness. There is no rebound.  Musculoskeletal: She exhibits no edema.  Neurological: She is alert and oriented to person, place, and time. Coordination normal.  Skin: Skin is warm and dry.   Vitals:   06/04/18 1011  BP: (!) 150/80  Pulse: 63  Temp: 97.9 F (36.6 C)  TempSrc: Oral  SpO2: 99%  Weight: 105 lb (47.6 kg)  Height: 5' 3.5" (1.613 m)      Assessment & Plan:  Flu shot given at visit

## 2018-06-04 NOTE — Assessment & Plan Note (Signed)
Time spent counseling about tobacco usage: 4 minutes. I have asked about smoking and is smoking more than usual. The patient is advised to quit. The patient is not willing to quit. They would like to try to quit in the next 6 months. We will follow up with them in 6 months.

## 2018-06-04 NOTE — Assessment & Plan Note (Signed)
Checking lipid panel and adjust lovastatin as needed for LDL <100.

## 2018-06-05 ENCOUNTER — Other Ambulatory Visit: Payer: Self-pay

## 2018-06-05 MED ORDER — GLUCAGON (RDNA) 1 MG IJ KIT: PACK | INTRAMUSCULAR | 5 refills | Status: DC

## 2018-06-07 ENCOUNTER — Telehealth: Payer: Self-pay

## 2018-06-07 ENCOUNTER — Other Ambulatory Visit: Payer: Self-pay | Admitting: Internal Medicine

## 2018-06-07 ENCOUNTER — Other Ambulatory Visit: Payer: Self-pay | Admitting: Endocrinology

## 2018-06-07 NOTE — Telephone Encounter (Signed)
AK-We rec fax from [facility] stating "Glucagon not covered by insurance.  The preferred alternative is Glucagen."/Is this ok to change? Plz advise/thx dmf

## 2018-06-07 NOTE — Telephone Encounter (Signed)
Okay to change? 

## 2018-06-11 MED ORDER — GLUCAGON HCL (RDNA) 1 MG IJ SOLR: 1.0000 mg | Freq: Once | INTRAMUSCULAR | 2 refills | Status: DC | PRN

## 2018-06-11 NOTE — Telephone Encounter (Signed)
Per AK I Leslie Lane/C'ed Glucagon and sent in Glucagen/thx dmf

## 2018-06-13 ENCOUNTER — Other Ambulatory Visit (INDEPENDENT_AMBULATORY_CARE_PROVIDER_SITE_OTHER): Payer: Medicare HMO

## 2018-06-13 DIAGNOSIS — E1065 Type 1 diabetes mellitus with hyperglycemia: Secondary | ICD-10-CM | POA: Diagnosis not present

## 2018-06-13 DIAGNOSIS — E049 Nontoxic goiter, unspecified: Secondary | ICD-10-CM

## 2018-06-13 LAB — COMPREHENSIVE METABOLIC PANEL
ALT: 13 U/L (ref 0–35)
AST: 19 U/L (ref 0–37)
Albumin: 4.2 g/dL (ref 3.5–5.2)
Alkaline Phosphatase: 50 U/L (ref 39–117)
BUN: 17 mg/dL (ref 6–23)
CO2: 34 mEq/L — ABNORMAL HIGH (ref 19–32)
Calcium: 9.7 mg/dL (ref 8.4–10.5)
Chloride: 98 mEq/L (ref 96–112)
Creatinine, Ser: 0.79 mg/dL (ref 0.40–1.20)
GFR: 77.84 mL/min (ref >60.00)
Glucose, Bld: 185 mg/dL — ABNORMAL HIGH (ref 70–99)
Potassium: 4.2 mEq/L (ref 3.5–5.1)
SODIUM: 136 meq/L (ref 135–145)
Total Bilirubin: 0.4 mg/dL (ref 0.2–1.2)
Total Protein: 6.8 g/dL (ref 6.0–8.3)

## 2018-06-13 LAB — LIPID PANEL
CHOL/HDL RATIO: 2
Cholesterol: 173 mg/dL (ref 0–200)
HDL: 79.2 mg/dL (ref >39.00)
LDL Cholesterol: 79 mg/dL (ref 0–99)
NonHDL: 93.99
Triglycerides: 73 mg/dL (ref 0.0–149.0)
VLDL: 14.6 mg/dL (ref 0.0–40.0)

## 2018-06-13 LAB — TSH: TSH: 1.78 u[IU]/mL (ref 0.35–4.50)

## 2018-06-13 LAB — HEMOGLOBIN A1C: Hgb A1c MFr Bld: 7.3 % — ABNORMAL HIGH (ref 4.6–6.5)

## 2018-06-14 ENCOUNTER — Other Ambulatory Visit: Payer: Self-pay

## 2018-06-17 ENCOUNTER — Encounter: Payer: Self-pay | Admitting: Endocrinology

## 2018-06-17 ENCOUNTER — Ambulatory Visit: Payer: Medicare HMO | Admitting: Endocrinology

## 2018-06-17 VITALS — BP 132/60 | HR 63 | Ht 63.5 in | Wt 106.2 lb

## 2018-06-17 DIAGNOSIS — E1065 Type 1 diabetes mellitus with hyperglycemia: Secondary | ICD-10-CM | POA: Diagnosis not present

## 2018-06-17 NOTE — Patient Instructions (Signed)
Do not take over 1 Novolog at dinner or when planning to go shopping  Take a snack whenever more active  8 [place]

## 2018-06-17 NOTE — Progress Notes (Signed)
Patient ID: Leslie Lane, female   DOB: 20-Jul-1955, 62 y.o.   MRN: [id number]   Reason for Appointment: Diabetes follow-up   History of Present Illness    Diagnosis: Type 1 DIABETES MELITUS, diagnosed 1967      She has had labile blood sugar control over the last several years even though A1c has been usually around 7% She has had less lability and hypoglycemia with taking b.i.d. Lantus compared once a day She has been very sensitive to fast acting insulin and frequently does not require mealtime coverage She cannot tolerate Toujeo because of reported episode of headache, bloating and nausea with the first dose  RECENT history:  Insulin regimen: Tresiba 7 units daily, Novolog 1-2 units up to 4 times a day   Her A1c tends to be in the 6-7 range and now 7.3  Current blood sugar patterns and problems identified:  She  has been using a Dexcom sensor but has not had a sensor for the last month  Previously she was having variable blood sugar patterns in November on the sensor with periodic high readings through the night and occasionally spikes after meals  However she was also having tendency to low blood sugars mostly in the late afternoon and occasionally late evening with 15% of readings below 70  More recently she appears to have overall high readings on her Accu-Chek with a 4-week average of 164  However she has extreme variability in her blood sugars at all times especially overnight and morning Along with this she will get anxious with her blood sugar going up over 200 and take extra NovoLog up to 2 units  HYPOGLYCEMIA: She is having more low blood sugars recently late at night around midnight only about every 4 to 6 days Also she may have sporadic low blood sugars at different times in the early evening when she is more active  May not have symptoms with low blood sugars and does not have early warning symptoms. Gets confused frequently and she depends on her  mother to recognize low sugars and treat them. Her mother knows how to give Glucagon injection She will treat her low blood sugars usually with juice, does carry glucose tablets with her      DIET: She is trying to get protein with eggs at breakfast Meal times: Variable.Marland Kitchen  She will eat more when she is anxious  Monitors blood glucose:  about 4-5 times a day.    Glucometer: Dexcom/  Accu-Chek [employer]iva    PRE-MEAL Fasting  early afternoon Dinner Bedtime  overnight  Glucose range:  101-362   39-315    Mean/median:  231  160  130  115  149   POST-MEAL PC Breakfast PC Lunch PC Dinner  Glucose range:     Mean/median:    133    AVERAGE 164 with overall range 30-375    Wt Readings from Last 3 Encounters:  06/17/18 106 lb 3.2 oz (48.2 kg)  06/04/18 105 lb (47.6 kg)  03/19/18 105 lb 6.4 oz (47.8 kg)    Lab Results  Component Value Date   HGBA1C 7.3 (H) 06/13/2018   HGBA1C 6.9 (H) 03/15/2018   HGBA1C 6.6 (H) 12/13/2017   Lab Results  Component Value Date   MICROALBUR 0.9 12/13/2017   LDLCALC 79 06/13/2018   CREATININE 0.79 06/13/2018        Allergies as of 06/17/2018      Reactions   Penicillins Anaphylaxis   Sulfonamide Derivatives  Anaphylaxis      Medication List       Accurate as of June 17, 2018  3:07 PM. Always use your most recent med list.        ACCU-CHEK AVIVA PLUS test strip Generic drug:  glucose blood TEST BLOOD SUGAR FOUR TIMES DAILY   ACCU-CHEK AVIVA PLUS w/Device Kit USE AS DIRECTED   ACCU-CHEK AVIVA Soln USE AS DIRECTED   ACCU-CHEK SOFTCLIX LANCETS lancets Use as instructed to check blood sugar 4 times per day dx code E10.65   aspirin EC 81 MG tablet Take 81 mg by mouth every morning.   B-D SINGLE USE SWABS REGULAR Pads USE FOUR TIMES DAILY   B-D ULTRAFINE III SHORT PEN 31G X 8 MM Misc Generic drug:  Insulin Pen Needle USE TO INJECT INSULIN DAILY.   diclofenac sodium 1 % Gel Commonly known as:  VOLTAREN APPLY 2 GRAMS EXTERNALLY  TO THE AFFECTED AREA FOUR TIMES DAILY   Fish Oil 1000 MG Caps Take 1 capsule by mouth daily.   gabapentin 100 MG capsule Commonly known as:  NEURONTIN TAKE 1 TO 2 CAPSULES BY MOUTH TWICE DAILY   glucagon 1 MG Solr injection Commonly known as:  GLUCAGEN HYPOKIT Inject 1 mg into the skin once as needed for up to 1 dose for low blood sugar.   insulin aspart 100 UNIT/ML FlexPen Commonly known as:  NOVOLOG FLEXPEN Sliding Scale 10 units in the am and 8 in the pm. 1-3 units as needed for high blood sugars.   insulin degludec 100 UNIT/ML Sopn FlexTouch Pen Commonly known as:  TRESIBA FLEXTOUCH Inject 0.15 mLs (15 Units total) into the skin daily.   lamoTRIgine 100 MG tablet Commonly known as:  LAMICTAL Take 1 tablet (100 mg total) by mouth 2 (two) times daily.   lidocaine 5 % Commonly known as:  LIDODERM Place 1 patch onto the skin daily. Remove & Discard patch within 12 hours or as directed by MD   lipase/protease/amylase 36000 UNITS Cpep capsule Commonly known as:  CREON Take 2 capsules (72,000 Units total) by mouth 3 (three) times daily before meals.   loratadine 10 MG tablet Commonly known as:  CLARITIN Take 10 mg by mouth as needed for allergies.   lovastatin 40 MG tablet Commonly known as:  MEVACOR Take 1 tablet (40 mg total) by mouth at bedtime.   Magnesium 500 MG Tabs Take 1 tablet by mouth as needed.   metoCLOPramide 5 MG tablet Commonly known as:  REGLAN TAKE 1 TABLET THREE TIMES DAILY BEFORE MEALS   multivitamin tablet Take 1 tablet by mouth daily. Reported on 11/22/2015   omeprazole 40 MG capsule Commonly known as:  PRILOSEC TAKE 1 CAPSULE(40 MG) BY MOUTH DAILY   traMADol 50 MG tablet Commonly known as:  ULTRAM TAKE 1 TABLET BY MOUTH EVERY OTHER DAY   trazodone 300 MG tablet Commonly known as:  DESYREL Take 1 tablet (300 mg total) by mouth at bedtime.   vitamin A 10000 UNIT capsule Take 10,000 Units by mouth daily.   vitamin B-12 1000 MCG  tablet Commonly known as:  CYANOCOBALAMIN Take 1 tablet by mouth daily.   vitamin E 1000 UNIT capsule Generic drug:  vitamin E Take 1,000 Units by mouth daily.       Allergies:  Allergies  Allergen Reactions  . Penicillins Anaphylaxis  . Sulfonamide Derivatives Anaphylaxis    Past Medical History:  Diagnosis Date  . ALLERGIC RHINITIS   . Allergy   . Anemia   .  Anxiety   . Arthritis   . BIPOLAR AFFECTIVE DISORDER   . Cataract   . DEPRESSION   . Diabetes mellitus type I ([place])   . DIABETES MELLITUS, TYPE II    follows with endo  . Fibromyalgia   . GERD   . HEPATITIS B   . HYPERLIPIDEMIA   . LOW BACK PAIN, CHRONIC   . MIGRAINE HEADACHE   . NARCOTIC ABUSE    hx of  . NECK PAIN, CHRONIC   . Neuropathy   . Seizures ([place])    pt states, "if my blood sugar drops to the 20's, I convulse.  It hasn't happened in a long time."  . SMOKER     Past Surgical History:  Procedure Laterality Date  . CESAREAN SECTION    . COLONOSCOPY    . TUBAL LIGATION    . UPPER GASTROINTESTINAL ENDOSCOPY      Family History  Problem Relation Age of Onset  . Arthritis Mother   . Colon cancer Mother        ? age of dx  . Arthritis Father   . Kidney disease Father   . Kidney cancer Father   . Heart attack Brother   . Bladder Cancer Sister   . Hyperlipidemia Other   . Kidney cancer Paternal Aunt   . Anxiety disorder Neg Hx   . Bipolar disorder Neg Hx   . Depression Neg Hx   . Breast cancer Neg Hx   . Esophageal cancer Neg Hx   . Stomach cancer Neg Hx   . Rectal cancer Neg Hx     Social History:  reports that she has been smoking cigarettes. She has a 60.00 pack-year smoking history. She has never used smokeless tobacco. She reports current alcohol use. She reports that she does not use drugs.  Review of Systems:   WEIGHT history:     Wt Readings from Last 3 Encounters:  06/17/18 106 lb 3.2 oz (48.2 kg)  06/04/18 105 lb (47.6 kg)  03/19/18 105 lb 6.4 oz (47.8 kg)     Lab Results  Component Value Date   TSH 1.78 06/13/2018    Has probable gastroparesis treated with Reglan  NEUROPATHY: She has burning in her legs and feet at times treated with gabapentin   DEPRESSION: She has had long-standing depression and anxiety with variable level of control and is on Lamictal   HYPERLIPIDEMIA: The lipid abnormality consists of elevated LDL   Is  on lovastatin 40 mg with  good control   Lab Results  Component Value Date   CHOL 173 06/13/2018   HDL 79.20 06/13/2018   LDLCALC 79 06/13/2018   LDLDIRECT 101.7 03/26/2012   TRIG 73.0 06/13/2018   CHOLHDL 2 06/13/2018    Memory difficulties: She has not been given specific advice by PCP   Examination:   BP 132/60 (BP Location: Left Arm, Patient Position: Sitting, Cuff Size: Normal)   Pulse 63   Ht 5' 3.5" (1.613 m)   Wt 106 lb 3.2 oz (48.2 kg)   SpO2 97%   BMI 18.52 kg/m   Body mass index is 18.52 kg/m.    ASSESSMENT/ PLAN:   Diabetes type 1 with consistently poor control  See history of present illness for detailed discussion of current insulin, blood sugar patterns, problems identified  A1c has been recently good and now 7.3, previously 6.9  She is not using her Dexcom sensor consistently This is partly because she has difficulty using the sensor  herself and does not have consistent supply For some reason her blood sugars are overall higher and her A1c is going up Even though she still had hypoglycemia with readings as low as in the 30s last month her blood sugars are mostly higher in the last couple of weeks  As before she has tendency to low sugars sporadically when she is relatively more active which can be in the afternoon or late evening Along with this she will get anxious with her blood sugar going up over 200 and take extra NovoLog up to 2 units  Again discussed the prevention of hypoglycemia especially when she is planning to be more active She will avoid taking more than 1  unit of NovoLog if she is planning to be active after lunch Also she should not take more than 1 unit of NovoLog in the late evening for meals Discussed bedtime snacks and she can try peanut butter crackers instead of Atkins shake  She may be a candidate for the T-Slim pump which will allow her to prevent hypoglycemia and in the future have basal rate regulation available also She will discuss this with her nurse educator Also needs to try using the Dexcom consistently  Discussed nasal glucagon but this is currently not available on her insurance    There are no Patient Instructions on file for this visit.  Counseling time on subjects discussed in assessment and plan sections is over 50% of today's 25 minute visit   [name] 06/17/2018, 3:07 PM

## 2018-06-19 ENCOUNTER — Encounter: Payer: Self-pay | Admitting: Nutrition

## 2018-07-15 ENCOUNTER — Other Ambulatory Visit (HOSPITAL_COMMUNITY): Payer: Self-pay | Admitting: Psychiatry

## 2018-07-15 DIAGNOSIS — F332 Major depressive disorder, recurrent severe without psychotic features: Secondary | ICD-10-CM

## 2018-07-15 DIAGNOSIS — I10 Essential (primary) hypertension: Secondary | ICD-10-CM | POA: Diagnosis not present

## 2018-07-15 DIAGNOSIS — Z961 Presence of intraocular lens: Secondary | ICD-10-CM | POA: Diagnosis not present

## 2018-07-15 DIAGNOSIS — E109 Type 1 diabetes mellitus without complications: Secondary | ICD-10-CM | POA: Diagnosis not present

## 2018-07-15 DIAGNOSIS — H52223 Regular astigmatism, bilateral: Secondary | ICD-10-CM | POA: Diagnosis not present

## 2018-07-15 DIAGNOSIS — H524 Presbyopia: Secondary | ICD-10-CM | POA: Diagnosis not present

## 2018-07-15 DIAGNOSIS — H5203 Hypermetropia, bilateral: Secondary | ICD-10-CM | POA: Diagnosis not present

## 2018-07-15 DIAGNOSIS — Z794 Long term (current) use of insulin: Secondary | ICD-10-CM | POA: Diagnosis not present

## 2018-07-15 DIAGNOSIS — F4010 Social phobia, unspecified: Secondary | ICD-10-CM

## 2018-07-15 LAB — HM DIABETES EYE EXAM

## 2018-07-18 ENCOUNTER — Ambulatory Visit (HOSPITAL_COMMUNITY): Payer: Medicare HMO | Admitting: Psychiatry

## 2018-07-18 ENCOUNTER — Encounter (HOSPITAL_COMMUNITY): Payer: Self-pay | Admitting: Psychiatry

## 2018-07-18 DIAGNOSIS — F332 Major depressive disorder, recurrent severe without psychotic features: Secondary | ICD-10-CM | POA: Diagnosis not present

## 2018-07-18 DIAGNOSIS — F4010 Social phobia, unspecified: Secondary | ICD-10-CM

## 2018-07-18 DIAGNOSIS — G4701 Insomnia due to medical condition: Secondary | ICD-10-CM | POA: Diagnosis not present

## 2018-07-18 MED ORDER — LIOTHYRONINE SODIUM 5 MCG PO TABS: 5.0000 ug | ORAL_TABLET | Freq: Every day | ORAL | 1 refills | Status: DC

## 2018-07-18 MED ORDER — TRAZODONE HCL 300 MG PO TABS: 300.0000 mg | ORAL_TABLET | Freq: Every day | ORAL | 0 refills | Status: DC

## 2018-07-18 MED ORDER — LAMOTRIGINE 100 MG PO TABS: 100.0000 mg | ORAL_TABLET | Freq: Two times a day (BID) | ORAL | 0 refills | Status: DC

## 2018-07-18 NOTE — Progress Notes (Signed)
BH MD/PA/NP OP Progress Note  07/18/2018 10:37 AM Leslie Lane  MRN:  [id number]  Chief Complaint:  Chief Complaint    Depression; Anxiety; Follow-up     HPI: Patient tells me that nothing is really changed since our last visit.  She remains just as depressed as before.  She is irritated all the time and prefers to be alone as much as possible.  She does have a few friends but reports that she really does not enjoy going out or engaging with anyone.  When she does go out with friends she wants to leave as soon as possible.  She has the same feeling when she has to run some errands such as going to the grocery store.  She believes it is due to her social anxiety but also due to her depression.  Her diabetes is not well controlled.  As a result she is feeling fatigued all the time.  Her motivation is generally low to do anything.  Most of the time she isolates in her room with her dog and is lying down in bed.  She will watch TV but finds that she cannot really concentrate on much of anything.  She again tells me that no meds have really helped in the past.  She is going to see her PCP next week and they are considering an insulin pump.  Leslie Lane is denying SI/HI.  Visit Diagnosis:    ICD-10-CM   1. Major depressive disorder, recurrent, severe without psychotic features (HCC) F33.2 lamoTRIgine (LAMICTAL) 100 MG tablet    trazodone (DESYREL) 300 MG tablet    liothyronine (CYTOMEL) 5 MCG tablet  2. Social anxiety disorder F40.10 lamoTRIgine (LAMICTAL) 100 MG tablet  3. Insomnia due to medical condition G47.01 trazodone (DESYREL) 300 MG tablet      Past Psychiatric History:  Anxiety: Yes Bipolar Disorder: No Depression: Yes Mania: No Psychosis: No Schizophrenia: No Personality Disorder: No  Hospitalization for psychiatric illness: No History of Electroconvulsive Shock Therapy: No Prior Suicide Attempts: Yes  Seroquel, Trazodone-ineffective   Lexapro   Depakote   Effexor and Cymbalta-  ineffective   Wellbutrin- ineffective   Abilify- ineffective   Paxil-effective but not the second time it was prescribed          Past Medical History:  Past Medical History:  Diagnosis Date  . ALLERGIC RHINITIS   . Allergy   . Anemia   . Anxiety   . Arthritis   . BIPOLAR AFFECTIVE DISORDER   . Cataract   . DEPRESSION   . Diabetes mellitus type I ([place])   . DIABETES MELLITUS, TYPE II    follows with endo  . Fibromyalgia   . GERD   . HEPATITIS B   . HYPERLIPIDEMIA   . LOW BACK PAIN, CHRONIC   . MIGRAINE HEADACHE   . NARCOTIC ABUSE    hx of  . NECK PAIN, CHRONIC   . Neuropathy   . Seizures ([place])    pt states, "if my blood sugar drops to the 20's, I convulse.  It hasn't happened in a long time."  . SMOKER     Past Surgical History:  Procedure Laterality Date  . CESAREAN SECTION    . COLONOSCOPY    . TUBAL LIGATION    . UPPER GASTROINTESTINAL ENDOSCOPY      Family Psychiatric History:  Family History  Problem Relation Age of Onset  . Arthritis Mother   . Colon cancer Mother        ?  age of dx  . Arthritis Father   . Kidney disease Father   . Kidney cancer Father   . Heart attack Brother   . Bladder Cancer Sister   . Hyperlipidemia Other   . Kidney cancer Paternal Aunt   . Anxiety disorder Neg Hx   . Bipolar disorder Neg Hx   . Depression Neg Hx   . Breast cancer Neg Hx   . Esophageal cancer Neg Hx   . Stomach cancer Neg Hx   . Rectal cancer Neg Hx     Social History:  Social History   Socioeconomic History  . Marital status: Divorced    Spouse name: Not on file  . Number of children: 1  . Years of education: Not on file  . Highest education level: Not on file  Occupational History  . Occupation: diabled  Social Needs  . Financial resource strain: Not on file  . Food insecurity:    Worry: Not on file    Inability: Not on file  . Transportation needs:    Medical: Not on file    Non-medical: Not on file  Tobacco Use  . Smoking status:  Current Every Day Smoker    Packs/day: 1.50    Years: 40.00    Pack years: 60.00    Types: Cigarettes  . Smokeless tobacco: Never Used  . Tobacco comment: tobacco info given 04/18/16  Substance and Sexual Activity  . Alcohol use: Yes    Alcohol/week: 0.0 standard drinks    Comment: occasionally- once every 2 weeks 1-2 beers  . Drug use: No  . Sexual activity: Never    Birth control/protection: Surgical, Abstinence  Lifestyle  . Physical activity:    Days per week: Not on file    Minutes per session: Not on file  . Stress: Not on file  Relationships  . Social connections:    Talks on phone: Not on file    Gets together: Not on file    Attends religious service: Not on file    Active member of club or organization: Not on file    Attends meetings of clubs or organizations: Not on file    Relationship status: Not on file  Other Topics Concern  . Not on file  Social History Narrative   Divorced, disabled   Lives with parents    Allergies:  Allergies  Allergen Reactions  . Penicillins Anaphylaxis  . Sulfonamide Derivatives Anaphylaxis    Metabolic Disorder Labs: Lab Results  Component Value Date   HGBA1C 7.3 (H) 06/13/2018   No results found for: PROLACTIN Lab Results  Component Value Date   CHOL 173 06/13/2018   TRIG 73.0 06/13/2018   HDL 79.20 06/13/2018   CHOLHDL 2 06/13/2018   VLDL 14.6 06/13/2018   LDLCALC 79 06/13/2018   LDLCALC 81 06/04/2018   Lab Results  Component Value Date   TSH 1.78 06/13/2018   TSH 2.59 06/04/2018    Therapeutic Level Labs: No results found for: LITHIUM No results found for: VALPROATE No components found for:  CBMZ  Current Medications: Current Outpatient Medications  Medication Sig Dispense Refill  . ACCU-CHEK AVIVA PLUS test strip TEST BLOOD SUGAR FOUR TIMES DAILY 400 each 3  . ACCU-CHEK SOFTCLIX LANCETS lancets Use as instructed to check blood sugar 4 times per day dx code E10.65 (Patient taking differently: Use as  instructed to check blood sugar 8 times per day dx code E10.65) 400 each 1  . Alcohol Swabs (B-D SINGLE USE  SWABS REGULAR) PADS USE FOUR TIMES DAILY 400 each 2  . aspirin EC 81 MG tablet Take 81 mg by mouth every morning.    . B-D ULTRAFINE III SHORT PEN 31G X 8 MM MISC USE TO INJECT INSULIN DAILY. 100 each 0  . Blood Glucose Calibration (ACCU-CHEK AVIVA) SOLN USE AS DIRECTED 1 each 1  . Blood Glucose Monitoring Suppl (ACCU-CHEK AVIVA PLUS) w/Device KIT USE AS DIRECTED 1 kit 0  . diclofenac sodium (VOLTAREN) 1 % GEL APPLY 2 GRAMS EXTERNALLY TO THE AFFECTED AREA FOUR TIMES DAILY 100 g 0  . gabapentin (NEURONTIN) 100 MG capsule TAKE 1 TO 2 CAPSULES BY MOUTH TWICE DAILY 120 capsule 2  . glucagon (GLUCAGEN HYPOKIT) 1 MG SOLR injection Inject 1 mg into the skin once as needed for up to 1 dose for low blood sugar. 1 each 2  . insulin aspart (NOVOLOG FLEXPEN) 100 UNIT/ML FlexPen Sliding Scale 10 units in the am and 8 in the pm. 1-3 units as needed for high blood sugars. 15 mL 3  . insulin degludec (TRESIBA FLEXTOUCH) 100 UNIT/ML SOPN FlexTouch Pen Inject 0.15 mLs (15 Units total) into the skin daily. 15 pen 1  . lamoTRIgine (LAMICTAL) 100 MG tablet Take 1 tablet (100 mg total) by mouth 2 (two) times daily. 180 tablet 0  . lidocaine (LIDODERM) 5 % Place 1 patch onto the skin daily. Remove & Discard patch within 12 hours or as directed by MD 30 patch 0  . lipase/protease/amylase (CREON) 36000 UNITS CPEP capsule Take 2 capsules (72,000 Units total) by mouth 3 (three) times daily before meals. 300 capsule 3  . loratadine (CLARITIN) 10 MG tablet Take 10 mg by mouth as needed for allergies.    Marland Kitchen lovastatin (MEVACOR) 40 MG tablet Take 1 tablet (40 mg total) by mouth at bedtime. 90 tablet 3  . Magnesium 500 MG TABS Take 1 tablet by mouth as needed.    . metoCLOPramide (REGLAN) 5 MG tablet TAKE 1 TABLET THREE TIMES DAILY BEFORE MEALS 270 tablet 1  . Multiple Vitamin (MULTIVITAMIN) tablet Take 1 tablet by mouth  daily. Reported on 11/22/2015    . Omega-3 Fatty Acids (FISH OIL) 1000 MG CAPS Take 1 capsule by mouth daily.    Marland Kitchen omeprazole (PRILOSEC) 40 MG capsule TAKE 1 CAPSULE(40 MG) BY MOUTH DAILY 90 capsule 3  . traMADol (ULTRAM) 50 MG tablet TAKE 1 TABLET BY MOUTH EVERY OTHER DAY 15 tablet 0  . trazodone (DESYREL) 300 MG tablet Take 1 tablet (300 mg total) by mouth at bedtime. 90 tablet 0  . vitamin A 10000 UNIT capsule Take 10,000 Units by mouth daily.    . vitamin B-12 (CYANOCOBALAMIN) 1000 MCG tablet Take 1 tablet by mouth daily.    . vitamin E (VITAMIN E) 1000 UNIT capsule Take 1,000 Units by mouth daily.      Marland Kitchen liothyronine (CYTOMEL) 5 MCG tablet Take 1 tablet (5 mcg total) by mouth daily. 30 tablet 1   No current facility-administered medications for this visit.      Musculoskeletal: Strength & Muscle Tone: within normal limits Gait & Station: normal Patient leans: N/A  Psychiatric Specialty Exam: Review of Systems  Constitutional: Positive for malaise/fatigue. Negative for fever and weight loss.  Psychiatric/Behavioral: Positive for depression. The patient is nervous/anxious. The patient does not have insomnia.     Blood pressure (!) 146/61, pulse 72, height 5' 3.5" (1.613 m), weight 106 lb (48.1 kg), SpO2 97 %.Body mass index is 18.48 kg/m.  General Appearance: Casual  Eye Contact:  Good  Speech:  Clear and Coherent and Normal Rate  Volume:  Normal  Mood:  Anxious and Depressed  Affect:  Congruent  Thought Process:  Goal Directed and Descriptions of Associations: Intact  Orientation:  Full (Time, Place, and Person)  Thought Content:  Logical  Suicidal Thoughts:  No  Homicidal Thoughts:  No  Memory:  Immediate;   Good  Judgement:  Good  Insight:  Good  Psychomotor Activity:  Normal  Concentration:  Concentration: Good  Recall:  Good  Fund of Knowledge:  Good  Language:  Good  Akathisia:  No  Handed:  Right  AIMS (if indicated):     Assets:  Communication Skills Desire  for Improvement Housing Transportation  ADL's:  Intact  Cognition:  WNL  Sleep:   fair with Trazodone        Screenings: GAD-7     Office Visit from 11/27/2017 in [facility]  Total GAD-7 Score  18    Mini-Mental     Office Visit from 05/30/2017 in [facility]  Total Score (max 30 points )  27    PHQ2-9     Office Visit from 11/27/2017 in [facility] Office Visit from 05/30/2017 in [facility] from 03/26/2017 in [facility] Visit from 04/13/2014 in [facility]  PHQ-2 Total Score  3  2  1   0  0  PHQ-9 Total Score  13  6  -  -  -     I reviewed the information below on 07/18/2018 and have updated it Assessment and Plan: MDD-recurrent, moderate without psychotic features; GAD; social anxiety disorder; insomnia; rule out bipolar disorder     Medication management with supportive therapy. Risks and benefits, side effects and alternative treatment options discussed with patient. Pt was given an opportunity to ask questions about medication, illness, and treatment. All current psychiatric medications have been reviewed and discussed with the patient and adjusted as clinically appropriate. The patient has been provided an accurate and updated list of the medications being now prescribed. Patient expressed understanding of how their medications were to be used.  Pt verbalized understanding and verbal consent obtained for treatment.  The risk of un-intended pregnancy is low based on the fact that pt reports she is post menopausal Pt is aware that these meds carry a teratogenic risk. Pt will discuss plan of action if she does or plans to become pregnant in the future.  Status of current problems: ongoing depression  Meds:  Lamictal 100 mg p.o. twice  daily for MDD Trazodone 300 mg p.o. nightly for MDD and insomnia Start trial of Cytomel 60m po qD for mood augmentation Pt is prescribed Neurontin which has an off label indication for anxiety We discussed her previous med trials and experience with each.  For the most part she tells me that she has had adequate trials but nothing has been effective.   Labs: none today  Therapy: brief supportive therapy provided. Discussed psychosocial stressors in detail. Pt understands that mood is affected by her physical health and until health improves it is unlikely her depression will dramatically improve.  Her diabetes is uncontrolled and she is working with her PCP  Consultations: none  Pt denies SI and is at an acute low risk for suicide. Patient told to  call clinic if any problems occur. Patient advised to go to ER if they should develop SI/HI, side effects, or if symptoms worsen. Has crisis numbers to call if needed. Pt verbalized understanding.  F/up in 2 months or sooner if needed  The duration of this appointment visit was 20 minutes of face-to-face time with the patient.  Greater than 50% of this time was spent in counseling, explanation of  diagnosis, planning of further management, and coordination of care   Charlcie Cradle, MD 07/18/2018, 10:37 AM

## 2018-07-30 DIAGNOSIS — R61 Generalized hyperhidrosis: Secondary | ICD-10-CM | POA: Diagnosis not present

## 2018-07-30 DIAGNOSIS — E161 Other hypoglycemia: Secondary | ICD-10-CM | POA: Diagnosis not present

## 2018-07-30 DIAGNOSIS — R4020 Unspecified coma: Secondary | ICD-10-CM | POA: Diagnosis not present

## 2018-07-30 DIAGNOSIS — I10 Essential (primary) hypertension: Secondary | ICD-10-CM | POA: Diagnosis not present

## 2018-07-30 DIAGNOSIS — E162 Hypoglycemia, unspecified: Secondary | ICD-10-CM | POA: Diagnosis not present

## 2018-08-02 ENCOUNTER — Telehealth: Payer: Self-pay | Admitting: Endocrinology

## 2018-08-02 NOTE — Telephone Encounter (Signed)
Medication:  insulin degludec (TRESIBA FLEXTOUCH) 100 UNIT/ML SOPN FlexTouch Pen   insulin aspart (NOVOLOG FLEXPEN) 100 UNIT/ML FlexPen   metoCLOPramide (REGLAN) 5 MG tablet    PHARMACY:  [place], [place] A 90 DAY SUPPLY :  yes  IS PATIENT OUT OF MEDICATION:  no  IF NOT; HOW MUCH IS LEFT: enough that will last until she receives this medication from the mail delivery   LAST APPOINTMENT DATE: @1 /23/2020  NEXT APPOINTMENT DATE:@2 /14/2020  DO WE HAVE YOUR PERMISSION TO LEAVE A DETAILED MESSAGE:  OTHER COMMENTS:    **Let patient know to contact pharmacy at the end of the day to make sure medication is ready. **  ** Please notify patient to allow 48-72 hours to process**  **Encourage patient to contact the pharmacy for refills or they can request refills through [facility]**

## 2018-08-02 NOTE — Telephone Encounter (Signed)
ACCU-CHEK AVIVA PLUS test strip  Patient called back and needs her test strips sent in also

## 2018-08-05 ENCOUNTER — Other Ambulatory Visit: Payer: Self-pay

## 2018-08-05 MED ORDER — INSULIN ASPART 100 UNIT/ML FLEXPEN: PEN_INJECTOR | SUBCUTANEOUS | 3 refills | Status: DC

## 2018-08-05 MED ORDER — INSULIN DEGLUDEC 100 UNIT/ML ~~LOC~~ SOPN: 15.0000 [IU] | PEN_INJECTOR | Freq: Every day | SUBCUTANEOUS | 1 refills | Status: DC

## 2018-08-05 MED ORDER — GLUCOSE BLOOD VI STRP: ORAL_STRIP | 3 refills | Status: DC

## 2018-08-05 MED ORDER — METOCLOPRAMIDE HCL 5 MG PO TABS: ORAL_TABLET | ORAL | 1 refills | Status: DC

## 2018-08-05 NOTE — Telephone Encounter (Signed)
Rx sent 

## 2018-08-07 ENCOUNTER — Other Ambulatory Visit: Payer: Self-pay | Admitting: Internal Medicine

## 2018-08-07 MED ORDER — OMEPRAZOLE 40 MG PO CPDR: DELAYED_RELEASE_CAPSULE | ORAL | 2 refills | Status: DC

## 2018-08-07 MED ORDER — GABAPENTIN 100 MG PO CAPS: ORAL_CAPSULE | ORAL | 2 refills | Status: DC

## 2018-08-07 MED ORDER — LOVASTATIN 40 MG PO TABS: 40.0000 mg | ORAL_TABLET | Freq: Every day | ORAL | 2 refills | Status: DC

## 2018-08-07 NOTE — Telephone Encounter (Signed)
Copied from [place] [id number]. Topic: Quick Communication - Rx Refill/Question >> Aug 07, 2018 11:09 AM Sheran Luz wrote: Medication: gabapentin (NEURONTIN) 100 MG capsule, lovastatin (MEVACOR) 40 MG tablet, omeprazole (PRILOSEC) 40 MG capsule  and traMADol (ULTRAM) 50 MG tablet .    Patient is requesting refills of these medications sent to new pharmacy.     Preferred Pharmacy (with phone number or street name):[facility] - [place], [place] [phone] (Phone) [phone] (Fax)

## 2018-08-07 NOTE — Telephone Encounter (Signed)
Called pharmacy last refill 02/04/2018 15 tablets LOV: 06/04/2018 CPE NOV:12/04/2018

## 2018-08-07 NOTE — Telephone Encounter (Signed)
Requested medication (s) are due for refill today -expired medication  Requested medication (s) are on the active medication list -yes  Future visit scheduled -yes  Last refill: 02/01/18  Notes to clinic: Patient is requesting a non delegated medication- sent for provider review.  (change in pharmacy)  Requested Prescriptions  Pending Prescriptions Disp Refills   traMADol (ULTRAM) 50 MG tablet 15 tablet 0    Sig: Take 1 tablet (50 mg total) by mouth every other day.     Not Delegated - Analgesics:  Opioid Agonists Failed - 08/07/2018 11:30 AM      Failed - This refill cannot be delegated      Failed - Urine Drug Screen completed in last 360 days.      Passed - Valid encounter within last 6 months    Recent Outpatient Visits          2 months ago Routine general medical examination at a health care facility   [place], [place], MD   5 months ago Cigarette nicotine dependence without complication   [place], [place], MD   8 months ago Chronic bilateral low back pain without sciatica   [facility], MD   1 year ago Encounter for Medicare annual wellness exam   [facility], MD   1 year ago Uncontrolled type 1 diabetes mellitus with hyperglycemia [facility])   [place]esar [place]avez, [place], MD      Future Appointments            In 3 months Sharlet Salina, Real Cons, MD [place], [facility]         Signed Prescriptions Disp Refills   gabapentin (NEURONTIN) 100 MG capsule 120 capsule 2    Sig: TAKE 1 TO 2 CAPSULES BY MOUTH TWICE DAILY     Neurology: Anticonvulsants - gabapentin Passed - 08/07/2018 11:30 AM      Passed - Valid encounter within last 12 months    Recent Outpatient Visits          2 months ago Routine general medical examination at a health care  facility   [place], [place], MD   5 months ago Cigarette nicotine dependence without complication   [place], Elizabeth A, MD   8 months ago Chronic bilateral low back pain without sciatica   [place], Elizabeth A, MD   1 year ago Encounter for Medicare annual wellness exam   [place], Elizabeth A, MD   1 year ago Uncontrolled type 1 diabetes mellitus with hyperglycemia [facility])   [place], [place], MD      Future Appointments            In 3 months Sharlet Salina Real Cons, MD [place], [facility]          lovastatin (MEVACOR) 40 MG tablet 90 tablet 2    Sig: Take 1 tablet (40 mg total) by mouth at bedtime.     Cardiovascular:  Antilipid - Statins Passed - 08/07/2018 11:30 AM      Passed - Total Cholesterol in normal range and within 360 days    Cholesterol  Date Value Ref Range Status  06/13/2018 173 0 - 200 mg/dL Final  Comment:    ATP III Classification       Desirable:  < 200 mg/dL               Borderline High:  200 - 239 mg/dL          High:  > = 240 mg/dL         Passed - LDL in normal range and within 360 days    LDL Cholesterol  Date Value Ref Range Status  06/13/2018 79 0 - 99 mg/dL Final         Passed - HDL in normal range and within 360 days    HDL  Date Value Ref Range Status  06/13/2018 79.20 >39.00 mg/dL Final         Passed - Triglycerides in normal range and within 360 days    Triglycerides  Date Value Ref Range Status  06/13/2018 73.0 0.0 - 149.0 mg/dL Final    Comment:    Normal:  <150 mg/dLBorderline High:  150 - 199 mg/dL         Passed - Patient is not pregnant      Passed - Valid encounter within last 12 months    Recent Outpatient Visits          2 months ago Routine general medical examination at a health care facility    [place], Wright, MD   5 months ago Cigarette nicotine dependence without complication   [place], [place], MD   8 months ago Chronic bilateral low back pain without sciatica   [place], Elizabeth A, MD   1 year ago Encounter for Medicare annual wellness exam   [place], Elizabeth A, MD   1 year ago Uncontrolled type 1 diabetes mellitus with hyperglycemia [facility])   [place], [place], MD      Future Appointments            In 3 months Sharlet Salina, Real Cons, MD [place], [facility]          omeprazole (PRILOSEC) 40 MG capsule 90 capsule 2    Sig: TAKE 1 CAPSULE(40 MG) BY MOUTH DAILY     Gastroenterology: Proton Pump Inhibitors Passed - 08/07/2018 11:30 AM      Passed - Valid encounter within last 12 months    Recent Outpatient Visits          2 months ago Routine general medical examination at a health care facility   [place], [place], MD   5 months ago Cigarette nicotine dependence without complication   [place], MD   8 months ago Chronic bilateral low back pain without sciatica   [facility], MD   1 year ago Encounter for Medicare annual wellness exam   [place], Elizabeth A, MD   1 year ago Uncontrolled type 1 diabetes mellitus with hyperglycemia [facility])   [place], MD      Future Appointments            In 3 months Sharlet Salina, Real Cons, MD [place], [facility]            Requested Prescriptions  Pending Prescriptions Disp Refills   traMADol Veatrice [place])  50 MG tablet 15 tablet 0    Sig: Take 1 tablet (50  mg total) by mouth every other day.     Not Delegated - Analgesics:  Opioid Agonists Failed - 08/07/2018 11:30 AM      Failed - This refill cannot be delegated      Failed - Urine Drug Screen completed in last 360 days.      Passed - Valid encounter within last 6 months    Recent Outpatient Visits          2 months ago Routine general medical examination at a health care facility   [place], [place], MD   5 months ago Cigarette nicotine dependence without complication   [place], [place], MD   8 months ago Chronic bilateral low back pain without sciatica   [facility], MD   1 year ago Encounter for Medicare annual wellness exam   [facility], MD   1 year ago Uncontrolled type 1 diabetes mellitus with hyperglycemia [facility])   [place], [place], MD      Future Appointments            In 3 months Sharlet Salina, Real Cons, MD [place], [facility]         Signed Prescriptions Disp Refills   gabapentin (NEURONTIN) 100 MG capsule 120 capsule 2    Sig: TAKE 1 TO 2 CAPSULES BY MOUTH TWICE DAILY     Neurology: Anticonvulsants - gabapentin Passed - 08/07/2018 11:30 AM      Passed - Valid encounter within last 12 months    Recent Outpatient Visits          2 months ago Routine general medical examination at a health care facility   [place], [place], MD   5 months ago Cigarette nicotine dependence without complication   [place], Elizabeth A, MD   8 months ago Chronic bilateral low back pain without sciatica   [place], Elizabeth A, MD   1 year ago Encounter for Medicare annual wellness exam   [place], Elizabeth A, MD   1 year ago Uncontrolled type 1 diabetes mellitus with hyperglycemia [facility])   [place], [place], MD      Future Appointments            In 3 months Sharlet Salina Real Cons, MD [place], [facility]          lovastatin (MEVACOR) 40 MG tablet 90 tablet 2    Sig: Take 1 tablet (40 mg total) by mouth at bedtime.     Cardiovascular:  Antilipid - Statins Passed - 08/07/2018 11:30 AM      Passed - Total Cholesterol in normal range and within 360 days    Cholesterol  Date Value Ref Range Status  06/13/2018 173 0 - 200 mg/dL Final    Comment:    ATP III Classification       Desirable:  < 200 mg/dL               Borderline High:  200 - 239 mg/dL          High:  > = 240 mg/dL  Passed - LDL in normal range and within 360 days    LDL Cholesterol  Date Value Ref Range Status  06/13/2018 79 0 - 99 mg/dL Final         Passed - HDL in normal range and within 360 days    HDL  Date Value Ref Range Status  06/13/2018 79.20 >39.00 mg/dL Final         Passed - Triglycerides in normal range and within 360 days    Triglycerides  Date Value Ref Range Status  06/13/2018 73.0 0.0 - 149.0 mg/dL Final    Comment:    Normal:  <150 mg/dLBorderline High:  150 - 199 mg/dL         Passed - Patient is not pregnant      Passed - Valid encounter within last 12 months    Recent Outpatient Visits          2 months ago Routine general medical examination at a health care facility   [place], [place], MD   5 months ago Cigarette nicotine dependence without complication   [place], MD   8 months ago Chronic bilateral low back pain without sciatica   [place], Elizabeth A, MD   1 year ago Encounter for Medicare annual wellness exam   [place],  Elizabeth A, MD   1 year ago Uncontrolled type 1 diabetes mellitus with hyperglycemia [facility] [place] [facility])   [place], [place], MD      Future Appointments            In 3 months Sharlet Salina, Real Cons, MD [place], [facility]          omeprazole (PRILOSEC) 40 MG capsule 90 capsule 2    Sig: TAKE 1 CAPSULE(40 MG) BY MOUTH DAILY     Gastroenterology: Proton Pump Inhibitors Passed - 08/07/2018 11:30 AM      Passed - Valid encounter within last 12 months    Recent Outpatient Visits          2 months ago Routine general medical examination at a health care facility   [place], [place], MD   5 months ago Cigarette nicotine dependence without complication   [place], MD   8 months ago Chronic bilateral low back pain without sciatica   [facility], MD   1 year ago Encounter for Medicare annual wellness exam   [facility], MD   1 year ago Uncontrolled type 1 diabetes mellitus with hyperglycemia [facility])   [place], [place], MD      Future Appointments            In 3 months Sharlet Salina, Real Cons, MD [place], [facility]

## 2018-08-08 ENCOUNTER — Other Ambulatory Visit: Payer: Self-pay

## 2018-08-08 MED ORDER — INSULIN ASPART 100 UNIT/ML FLEXPEN: PEN_INJECTOR | SUBCUTANEOUS | 3 refills | Status: DC

## 2018-08-13 ENCOUNTER — Other Ambulatory Visit: Payer: Self-pay

## 2018-08-13 ENCOUNTER — Telehealth: Payer: Self-pay | Admitting: Endocrinology

## 2018-08-13 ENCOUNTER — Other Ambulatory Visit: Payer: Self-pay | Admitting: Endocrinology

## 2018-08-13 MED ORDER — INSULIN ASPART 100 UNIT/ML FLEXPEN: PEN_INJECTOR | SUBCUTANEOUS | 3 refills | Status: DC

## 2018-08-13 NOTE — Telephone Encounter (Signed)
Called pt and clarified how she was taking her insulin. Dr. Dwyane Dee stated the sig should read "inject 1-3 units SQ TID before meals.

## 2018-08-13 NOTE — Telephone Encounter (Signed)
Patient requests to be called at ph# [phone] to discuss if Dr. Dwyane Dee is changing patient's insulin.

## 2018-08-16 ENCOUNTER — Other Ambulatory Visit: Payer: Self-pay

## 2018-08-21 ENCOUNTER — Ambulatory Visit: Payer: Medicare HMO | Admitting: Endocrinology

## 2018-08-21 ENCOUNTER — Other Ambulatory Visit: Payer: Self-pay

## 2018-08-21 ENCOUNTER — Encounter: Payer: Self-pay | Admitting: Endocrinology

## 2018-08-21 ENCOUNTER — Other Ambulatory Visit: Payer: Self-pay | Admitting: Internal Medicine

## 2018-08-21 VITALS — BP 106/56 | HR 84 | Ht 63.5 in | Wt 105.0 lb

## 2018-08-21 DIAGNOSIS — R413 Other amnesia: Secondary | ICD-10-CM

## 2018-08-21 DIAGNOSIS — E1065 Type 1 diabetes mellitus with hyperglycemia: Secondary | ICD-10-CM | POA: Diagnosis not present

## 2018-08-21 LAB — GLUCOSE, POCT (MANUAL RESULT ENTRY): POC Glucose: 121 mg/dl — AB (ref 70–99)

## 2018-08-21 MED ORDER — GLUCAGON HCL (RDNA) 1 MG IJ SOLR: 1.0000 mg | Freq: Once | INTRAMUSCULAR | 2 refills | Status: DC | PRN

## 2018-08-21 MED ORDER — INSULIN PEN NEEDLE 31G X 8 MM MISC: 0 refills | Status: DC

## 2018-08-21 NOTE — Patient Instructions (Signed)
Only take Novolog if sugar > 200  Stop Liothyronine

## 2018-08-21 NOTE — Progress Notes (Signed)
Patient ID: Leslie Lane, female   DOB: 03-May-1956, 63 y.o.   MRN: [id number]   Reason for Appointment: Endocrinology follow-up   History of Present Illness    Diagnosis: Type 1 DIABETES MELITUS, diagnosed 1967      She has had labile blood sugar control over the last several years even though A1c has been usually around 7% She has had less lability and hypoglycemia with taking b.i.d. Lantus compared once a day She has been very sensitive to fast acting insulin and frequently does not require mealtime coverage She cannot tolerate Toujeo because of reported episode of headache, bloating and nausea with the first dose  RECENT history:  Insulin regimen: Tresiba 8 units daily, Novolog 1-2 units up to 4 times a day   Her A1c tends to be in the 6-7 range and last 7.3  Current blood sugar patterns and problems identified:  She  has not been using a Dexcom sensor because she thinks it is too difficult to use now  She does have some sensors at home but she is complaining about the cost also  With her difficulties with memory she is not able to answer the question about how much or how often she is taking NovoLog  She now says that she is trying to take NovoLog more than once a day just to cover her meals even though she has had frequent hypoglycemia  FASTING blood sugars are quite variable and she has had blood sugars as low as 50 early morning  However blood sugars have been as low as 23 and as low blood sugars several times a week  Claims that she needs to take NovoLog before she is sometimes overeating and snacking at various times but has no set dose for various foods  Today she thinks she had an orange and an Atkins shake but she still took the NovoLog with her glucose of 117 fasting  She is also maintaining her weight   HYPOGLYCEMIA: As above  May not have symptoms with low blood sugars and does not have early warning symptoms. Gets confused frequently and she  depends on her mother to recognize low sugars and treat them.  Her mother knows how to give Glucagon injection although on the last injection apparently the needle broke off and she had to call EMS  She will treat her low blood sugars usually with juice, does carry glucose tablets with her      DIET: She is trying to get protein with eggs at breakfast Meal times: Variable.Marland Kitchen  She will eat more when she is anxious  Monitors blood glucose:  about 4-5 times a day.    Glucometer:Accu-Chek Aviva   PRE-MEAL Fasting Lunch Dinner Bedtime Overall  Glucose range:  50-299    43-321  23-354  Mean/median:  174  140  115  133  137+/-70   POST-MEAL PC Breakfast PC Lunch PC Dinner  Glucose range:     Mean/median:   121    PREVIOUS readings:   PRE-MEAL Fasting  early afternoon Dinner Bedtime  overnight  Glucose range:  101-362   39-315    Mean/median:  231  160  130  115  149   POST-MEAL PC Breakfast PC Lunch PC Dinner  Glucose range:     Mean/median:    133     Wt Readings from Last 3 Encounters:  08/21/18 105 lb (47.6 kg)  06/17/18 106 lb 3.2 oz (48.2 kg)  06/04/18 105 lb (47.6  kg)    Lab Results  Component Value Date   HGBA1C 7.3 (H) 06/13/2018   HGBA1C 6.9 (H) 03/15/2018   HGBA1C 6.6 (H) 12/13/2017   Lab Results  Component Value Date   MICROALBUR 0.9 12/13/2017   LDLCALC 79 06/13/2018   CREATININE 0.79 06/13/2018        Allergies as of 08/21/2018      Reactions   Penicillins Anaphylaxis   Sulfonamide Derivatives Anaphylaxis      Medication List       Accurate as of August 21, 2018 10:35 AM. Always use your most recent med list.        ACCU-CHEK AVIVA PLUS w/Device Kit USE AS DIRECTED   ACCU-CHEK AVIVA Soln USE AS DIRECTED   ACCU-CHEK SOFTCLIX LANCETS lancets Use as instructed to check blood sugar 4 times per day dx code E10.65   aspirin EC 81 MG tablet Take 81 mg by mouth every morning.   B-D SINGLE USE SWABS REGULAR Pads USE FOUR TIMES DAILY     diclofenac sodium 1 % Gel Commonly known as:  VOLTAREN APPLY 2 GRAMS EXTERNALLY TO THE AFFECTED AREA FOUR TIMES DAILY   Fish Oil 1000 MG Caps Take 1 capsule by mouth daily.   gabapentin 100 MG capsule Commonly known as:  NEURONTIN TAKE 1 TO 2 CAPSULES BY MOUTH TWICE DAILY   glucagon 1 MG Solr injection Commonly known as:  GLUCAGEN HYPOKIT Inject 1 mg into the skin once as needed for up to 1 dose for low blood sugar.   glucose blood test strip Commonly known as:  ACCU-CHEK AVIVA PLUS TEST BLOOD SUGAR FOUR TIMES DAILY   insulin aspart 100 UNIT/ML FlexPen Commonly known as:  NOVOLOG FLEXPEN INJECT 1-3 UNITS UNDER THE SKIN UP TO 3 TIMES DAILY.   insulin degludec 100 UNIT/ML Sopn FlexTouch Pen Commonly known as:  TRESIBA FLEXTOUCH Inject 0.15 mLs (15 Units total) into the skin daily.   Insulin Pen Needle 31G X 8 MM Misc Commonly known as:  B-D ULTRAFINE III SHORT PEN USE TO INJECT INSULIN 4 TIMES DAILY.   lamoTRIgine 100 MG tablet Commonly known as:  LAMICTAL Take 1 tablet (100 mg total) by mouth 2 (two) times daily.   lidocaine 5 % Commonly known as:  LIDODERM Place 1 patch onto the skin daily. Remove & Discard patch within 12 hours or as directed by MD   liothyronine 5 MCG tablet Commonly known as:  CYTOMEL Take 1 tablet (5 mcg total) by mouth daily.   lipase/protease/amylase 36000 UNITS Cpep capsule Commonly known as:  CREON Take 2 capsules (72,000 Units total) by mouth 3 (three) times daily before meals.   loratadine 10 MG tablet Commonly known as:  CLARITIN Take 10 mg by mouth as needed for allergies.   lovastatin 40 MG tablet Commonly known as:  MEVACOR Take 1 tablet (40 mg total) by mouth at bedtime.   Magnesium 500 MG Tabs Take 1 tablet by mouth as needed.   metoCLOPramide 5 MG tablet Commonly known as:  REGLAN TAKE 1 TABLET THREE TIMES DAILY BEFORE MEALS   multivitamin tablet Take 1 tablet by mouth daily. Reported on 11/22/2015   omeprazole 40 MG  capsule Commonly known as:  PRILOSEC TAKE 1 CAPSULE(40 MG) BY MOUTH DAILY   traMADol 50 MG tablet Commonly known as:  ULTRAM TAKE 1 TABLET BY MOUTH EVERY OTHER DAY   trazodone 300 MG tablet Commonly known as:  DESYREL Take 1 tablet (300 mg total) by mouth at bedtime.  vitamin A 10000 UNIT capsule Take 10,000 Units by mouth daily.   vitamin B-12 1000 MCG tablet Commonly known as:  CYANOCOBALAMIN Take 1 tablet by mouth daily.   vitamin E 1000 UNIT capsule Generic drug:  vitamin E Take 1,000 Units by mouth daily.       Allergies:  Allergies  Allergen Reactions  . Penicillins Anaphylaxis  . Sulfonamide Derivatives Anaphylaxis    Past Medical History:  Diagnosis Date  . ALLERGIC RHINITIS   . Allergy   . Anemia   . Anxiety   . Arthritis   . BIPOLAR AFFECTIVE DISORDER   . Cataract   . DEPRESSION   . Diabetes mellitus type I ([place])   . DIABETES MELLITUS, TYPE II    follows with endo  . Fibromyalgia   . GERD   . HEPATITIS B   . HYPERLIPIDEMIA   . LOW BACK PAIN, CHRONIC   . MIGRAINE HEADACHE   . NARCOTIC ABUSE    hx of  . NECK PAIN, CHRONIC   . Neuropathy   . Seizures ([place])    pt states, "if my blood sugar drops to the 20's, I convulse.  It hasn't happened in a long time."  . SMOKER     Past Surgical History:  Procedure Laterality Date  . CESAREAN SECTION    . COLONOSCOPY    . TUBAL LIGATION    . UPPER GASTROINTESTINAL ENDOSCOPY      Family History  Problem Relation Age of Onset  . Arthritis Mother   . Colon cancer Mother        ? age of dx  . Arthritis Father   . Kidney disease Father   . Kidney cancer Father   . Heart attack Brother   . Bladder Cancer Sister   . Hyperlipidemia Other   . Kidney cancer Paternal Aunt   . Anxiety disorder Neg Hx   . Bipolar disorder Neg Hx   . Depression Neg Hx   . Breast cancer Neg Hx   . Esophageal cancer Neg Hx   . Stomach cancer Neg Hx   . Rectal cancer Neg Hx     Social History:  reports that she has  been smoking cigarettes. She has a 60.00 pack-year smoking history. She has never used smokeless tobacco. She reports current alcohol use. She reports that she does not use drugs.  Review of Systems:   WEIGHT history: Recently stable  Wt Readings from Last 3 Encounters:  08/21/18 105 lb (47.6 kg)  06/17/18 106 lb 3.2 oz (48.2 kg)  06/04/18 105 lb (47.6 kg)     Has probable gastroparesis treated with Reglan as needed  NEUROPATHY: She has burning in her legs and feet at times treated with gabapentin   DEPRESSION: She has had long-standing depression and anxiety with variable level of control and is on Lamictal Appears that her psychiatrist was prescribing her liothyronine but she took this for 3 days and did not like how she felt and is currently not taking it She has normal TSH levels  HYPERLIPIDEMIA: The lipid abnormality consists of elevated LDL   Is  on lovastatin 40 mg with  good control   Lab Results  Component Value Date   CHOL 173 06/13/2018   HDL 79.20 06/13/2018   LDLCALC 79 06/13/2018   LDLDIRECT 101.7 03/26/2012   TRIG 73.0 06/13/2018   CHOLHDL 2 06/13/2018    Memory difficulties: She has discussed with PCP but she thinks she is having significant problems and  wants evaluation for this Discussed that she is having excessive hypoglycemia and needs to prevent this B12 and thyroid levels have been normal    Examination:   BP (!) 106/56 (BP Location: Left Arm, Patient Position: Sitting, Cuff Size: Normal)   Pulse 84   Ht 5' 3.5" (1.613 m)   Wt 105 lb (47.6 kg)   SpO2 96%   BMI 18.31 kg/m   Body mass index is 18.31 kg/m.    ASSESSMENT/ PLAN:   Diabetes type 1 with consistently poor control  See history of present illness for detailed discussion of current insulin, blood sugar patterns, problems identified  A1c has been usually fairly good, last 7.3 although has been better   She is not using her Dexcom sensor currently This is partly because she  has difficulty using the sensor herself and also apparently because of the cost She is interested in the insulin pump but discussed that she needs to use a sensor first before this can be used Also not clear if she can be relied upon to use the pump with her difficulty with memory and cognition However to help prevent hypoglycemia and the Dexcom may be useful to use with the alarms and she will talk to the nurse educator about how to use it Discussed nasal glucagon but this is currently not available on her insurance, also the prefilled glucagon is not available on insurance  She will need to keep a record of when she is taking NovoLog insulin and not take it arbitrarily Discussed that she will not take NovoLog insulin unless blood sugars are over 200 to prevent hypoglycemia Continue same dose of Tresiba as she has only rare fasting hypoglycemia Discussed blood sugar targets after meals  Also agreed that she should not be taking liothyronine with her normal thyroid functions and potential for weight loss and anxiety and this was taken off her list  Memory difficulties: Neurology consultation has been done    There are no Patient Instructions on file for this visit.  Counseling time on subjects discussed in assessment and plan sections is over 50% of today's 25 minute visit   [name] 08/21/2018, 10:35 AM

## 2018-08-22 NOTE — Telephone Encounter (Signed)
Check [place] registry last filled 02/01/2018.Marland KitchenJohny Chess

## 2018-08-27 ENCOUNTER — Encounter: Payer: Self-pay | Admitting: Neurology

## 2018-09-03 ENCOUNTER — Other Ambulatory Visit: Payer: Self-pay | Admitting: Internal Medicine

## 2018-09-19 ENCOUNTER — Encounter (HOSPITAL_COMMUNITY): Payer: Self-pay | Admitting: Psychiatry

## 2018-09-19 ENCOUNTER — Other Ambulatory Visit: Payer: Self-pay

## 2018-09-19 ENCOUNTER — Ambulatory Visit (HOSPITAL_COMMUNITY): Payer: Medicare HMO | Admitting: Psychiatry

## 2018-09-19 VITALS — BP 135/65 | HR 85 | Temp 98.2°F | Resp 16 | Wt 106.0 lb

## 2018-09-19 DIAGNOSIS — F332 Major depressive disorder, recurrent severe without psychotic features: Secondary | ICD-10-CM

## 2018-09-19 DIAGNOSIS — F17210 Nicotine dependence, cigarettes, uncomplicated: Secondary | ICD-10-CM

## 2018-09-19 DIAGNOSIS — R05 Cough: Secondary | ICD-10-CM

## 2018-09-19 DIAGNOSIS — R5381 Other malaise: Secondary | ICD-10-CM

## 2018-09-19 DIAGNOSIS — F4010 Social phobia, unspecified: Secondary | ICD-10-CM | POA: Diagnosis not present

## 2018-09-19 DIAGNOSIS — G4701 Insomnia due to medical condition: Secondary | ICD-10-CM | POA: Diagnosis not present

## 2018-09-19 MED ORDER — TRAZODONE HCL 300 MG PO TABS: 300.0000 mg | ORAL_TABLET | Freq: Every day | ORAL | 0 refills | Status: DC

## 2018-09-19 MED ORDER — LAMOTRIGINE 25 MG PO TABS: ORAL_TABLET | ORAL | 1 refills | Status: DC

## 2018-09-19 MED ORDER — LAMOTRIGINE 100 MG PO TABS: 100.0000 mg | ORAL_TABLET | Freq: Two times a day (BID) | ORAL | 0 refills | Status: DC

## 2018-09-19 NOTE — Progress Notes (Signed)
[facility] MD/PA/NP OP Progress Note  09/19/2018 11:09 AM Leslie Lane  MRN:  [id number]  Chief Complaint:  Chief Complaint    Depression; Anxiety; Follow-up     HPI: Patient is continuing to report that her symptoms are unchanged.  She is depressed.  She is irritated by every little thing.  She prefers to be alone as much as possible.  She has very low motivation and ongoing fatigue.  Her social anxiety contributes to her desire to isolate.  As her diabetes remains uncontrolled she is experiencing ongoing fatigue.  She continues to spend a lot of time in bed.  Her biggest concern at this point is that she is noticing changes in concentration and memory.  Leslie Lane states that she cannot focus on anything and cannot remember activities, events, conversations or things that she has read from day to day.  When she watches TV she feels very restless and cannot focus on any show and continuously changes the channel.  She does have an appointment set up with neurology in May and hopes to get some answers.  Leslie Lane denies any SI/HI.   Visit Diagnosis:    ICD-10-CM   1. Major depressive disorder, recurrent, severe without psychotic features (HCC) F33.2 lamoTRIgine (LAMICTAL) 100 MG tablet    trazodone (DESYREL) 300 MG tablet    lamoTRIgine (LAMICTAL) 25 MG tablet  2. Social anxiety disorder F40.10 lamoTRIgine (LAMICTAL) 100 MG tablet  3. Insomnia due to medical condition G47.01 trazodone (DESYREL) 300 MG tablet      Past Psychiatric History:  Anxiety: Yes Bipolar Disorder: No Depression: Yes Mania: No Psychosis: No Schizophrenia: No Personality Disorder: No  Hospitalization for psychiatric illness: No History of Electroconvulsive Shock Therapy: No Prior Suicide Attempts: Yes  Seroquel, Trazodone-ineffective   Lexapro   Depakote   Effexor and Cymbalta- ineffective   Wellbutrin- ineffective   Abilify- ineffective   Paxil-effective but not the second time it was prescribed          Past  Medical History:  Past Medical History:  Diagnosis Date  . ALLERGIC RHINITIS   . Allergy   . Anemia   . Anxiety   . Arthritis   . BIPOLAR AFFECTIVE DISORDER   . Cataract   . DEPRESSION   . Diabetes mellitus type I ([place])   . DIABETES MELLITUS, TYPE II    follows with endo  . Fibromyalgia   . GERD   . HEPATITIS B   . HYPERLIPIDEMIA   . LOW BACK PAIN, CHRONIC   . MIGRAINE HEADACHE   . NARCOTIC ABUSE    hx of  . NECK PAIN, CHRONIC   . Neuropathy   . Seizures ([place])    pt states, "if my blood sugar drops to the 20's, I convulse.  It hasn't happened in a long time."  . SMOKER     Past Surgical History:  Procedure Laterality Date  . CESAREAN SECTION    . COLONOSCOPY    . TUBAL LIGATION    . UPPER GASTROINTESTINAL ENDOSCOPY      Family Psychiatric History:  Family History  Problem Relation Age of Onset  . Arthritis Mother   . Colon cancer Mother        ? age of dx  . Arthritis Father   . Kidney disease Father   . Kidney cancer Father   . Heart attack Brother   . Bladder Cancer Sister   . Hyperlipidemia Other   . Kidney cancer Paternal Aunt   . Anxiety  disorder Neg Hx   . Bipolar disorder Neg Hx   . Depression Neg Hx   . Breast cancer Neg Hx   . Esophageal cancer Neg Hx   . Stomach cancer Neg Hx   . Rectal cancer Neg Hx     Social History:  Social History   Socioeconomic History  . Marital status: Divorced    Spouse name: Not on file  . Number of children: 1  . Years of education: Not on file  . Highest education level: Not on file  Occupational History  . Occupation: diabled  Social Needs  . Financial resource strain: Not on file  . Food insecurity:    Worry: Not on file    Inability: Not on file  . Transportation needs:    Medical: Not on file    Non-medical: Not on file  Tobacco Use  . Smoking status: Current Every Day Smoker    Packs/day: 1.50    Years: 40.00    Pack years: 60.00    Types: Cigarettes  . Smokeless tobacco: Never Used  .  Tobacco comment: tobacco info given 04/18/16  Substance and Sexual Activity  . Alcohol use: Yes    Alcohol/week: 0.0 standard drinks    Comment: occasionally- once every 2 weeks 1-2 beers  . Drug use: No  . Sexual activity: Never    Birth control/protection: Surgical, Abstinence  Lifestyle  . Physical activity:    Days per week: Not on file    Minutes per session: Not on file  . Stress: Not on file  Relationships  . Social connections:    Talks on phone: Not on file    Gets together: Not on file    Attends religious service: Not on file    Active member of club or organization: Not on file    Attends meetings of clubs or organizations: Not on file    Relationship status: Not on file  Other Topics Concern  . Not on file  Social History Narrative   Divorced, disabled   Lives with parents    Allergies:  Allergies  Allergen Reactions  . Penicillins Anaphylaxis  . Sulfonamide Derivatives Anaphylaxis    Metabolic Disorder Labs: Lab Results  Component Value Date   HGBA1C 7.3 (H) 06/13/2018   No results found for: PROLACTIN Lab Results  Component Value Date   CHOL 173 06/13/2018   TRIG 73.0 06/13/2018   HDL 79.20 06/13/2018   CHOLHDL 2 06/13/2018   VLDL 14.6 06/13/2018   LDLCALC 79 06/13/2018   LDLCALC 81 06/04/2018   Lab Results  Component Value Date   TSH 1.78 06/13/2018   TSH 2.59 06/04/2018    Therapeutic Level Labs: No results found for: LITHIUM No results found for: VALPROATE No components found for:  CBMZ  Current Medications: Current Outpatient Medications  Medication Sig Dispense Refill  . ACCU-CHEK SOFTCLIX LANCETS lancets Use as instructed to check blood sugar 4 times per day dx code E10.65 (Patient taking differently: Use as instructed to check blood sugar 8 times per day dx code E10.65) 400 each 1  . Alcohol Swabs (B-D SINGLE USE SWABS REGULAR) PADS USE FOUR TIMES DAILY 400 each 2  . aspirin EC 81 MG tablet Take 81 mg by mouth every morning.     . Blood Glucose Calibration (ACCU-CHEK AVIVA) SOLN USE AS DIRECTED 1 each 1  . Blood Glucose Monitoring Suppl (ACCU-CHEK AVIVA PLUS) w/Device KIT USE AS DIRECTED 1 kit 0  . diclofenac sodium (VOLTAREN) 1 %  GEL APPLY 2 GRAMS EXTERNALLY TO THE AFFECTED AREA FOUR TIMES DAILY 100 g 0  . gabapentin (NEURONTIN) 100 MG capsule TAKE 1 TO 2 CAPSULES BY MOUTH TWICE DAILY 120 capsule 2  . glucagon (GLUCAGEN HYPOKIT) 1 MG SOLR injection Inject 1 mg into the skin once as needed for up to 1 dose for low blood sugar. 2 each 2  . glucose blood (ACCU-CHEK AVIVA PLUS) test strip TEST BLOOD SUGAR FOUR TIMES DAILY 400 each 3  . insulin aspart (NOVOLOG FLEXPEN) 100 UNIT/ML FlexPen INJECT 1-3 UNITS UNDER THE SKIN UP TO 3 TIMES DAILY. 15 mL 3  . insulin degludec (TRESIBA FLEXTOUCH) 100 UNIT/ML SOPN FlexTouch Pen Inject 0.15 mLs (15 Units total) into the skin daily. 15 pen 1  . Insulin Pen Needle (B-D ULTRAFINE III SHORT PEN) 31G X 8 MM MISC USE TO INJECT INSULIN 4 TIMES DAILY. 100 each 0  . lamoTRIgine (LAMICTAL) 100 MG tablet Take 1 tablet (100 mg total) by mouth 2 (two) times daily. 180 tablet 0  . lidocaine (LIDODERM) 5 % Place 1 patch onto the skin daily. Remove & Discard patch within 12 hours or as directed by MD 30 patch 0  . lipase/protease/amylase (CREON) 36000 UNITS CPEP capsule Take 2 capsules (72,000 Units total) by mouth 3 (three) times daily before meals. 300 capsule 3  . loratadine (CLARITIN) 10 MG tablet Take 10 mg by mouth as needed for allergies.    Marland Kitchen lovastatin (MEVACOR) 40 MG tablet Take 1 tablet (40 mg total) by mouth at bedtime. 90 tablet 2  . Magnesium 500 MG TABS Take 1 tablet by mouth as needed.    . metoCLOPramide (REGLAN) 5 MG tablet TAKE 1 TABLET THREE TIMES DAILY BEFORE MEALS 270 tablet 1  . Multiple Vitamin (MULTIVITAMIN) tablet Take 1 tablet by mouth daily. Reported on 11/22/2015    . Omega-3 Fatty Acids (FISH OIL) 1000 MG CAPS Take 1 capsule by mouth daily.    Marland Kitchen omeprazole (PRILOSEC) 40 MG  capsule TAKE 1 CAPSULE(40 MG) BY MOUTH DAILY 90 capsule 2  . trazodone (DESYREL) 300 MG tablet Take 1 tablet (300 mg total) by mouth at bedtime. 90 tablet 0  . vitamin B-12 (CYANOCOBALAMIN) 1000 MCG tablet Take 1 tablet by mouth daily.    . vitamin E (VITAMIN E) 1000 UNIT capsule Take 1,000 Units by mouth daily.      Marland Kitchen lamoTRIgine (LAMICTAL) 25 MG tablet Take 1 tablet (25 mg total) by mouth daily for 14 days, THEN 1 tablet (25 mg total) 2 (two) times daily for 30 days. Total dose will be 17m po qHS for 14 days then increase to 1276mpo BID. 74 tablet 1  . traMADol (ULTRAM) 50 MG tablet TAKE 1 TABLET BY MOUTH EVERY OTHER DAY (Patient not taking: Reported on 09/19/2018) 15 tablet 0  . vitamin A 10000 UNIT capsule Take 10,000 Units by mouth daily.     No current facility-administered medications for this visit.      Musculoskeletal: Strength & Muscle Tone: within normal limits Gait & Station: normal Patient leans: N/A  Psychiatric Specialty Exam: Review of Systems  Constitutional: Positive for malaise/fatigue. Negative for chills and fever.  Respiratory: Positive for cough. Negative for sputum production and shortness of breath.     Blood pressure 135/65, pulse 85, temperature 98.2 F (36.8 C), resp. rate 16, weight 106 lb (48.1 kg).Body mass index is 18.48 kg/m.  General Appearance: Casual  Eye Contact:  Good  Speech:  Clear and Coherent and  Normal Rate  Volume:  Normal  Mood:  Anxious and Depressed  Affect:  Congruent  Thought Process:  Goal Directed and Descriptions of Associations: Intact  Orientation:  Full (Time, Place, and Person)  Thought Content:  Logical  Suicidal Thoughts:  No  Homicidal Thoughts:  No  Memory:  Immediate;   Fair  Judgement:  Intact  Insight:  Present  Psychomotor Activity:  Normal  Concentration:  Concentration: Poor  Recall:  Poor  Fund of Knowledge:  Fair  Language:  Good  Akathisia:  No  Handed:  Right  AIMS (if indicated):     Assets:   Communication Skills Desire for Improvement Financial Resources/Insurance Housing Transportation  ADL's:  Intact  Cognition:  WNL  Sleep:   fair with Trazodone        Screenings: GAD-7     Office Visit from 11/27/2017 in [place]  Total GAD-7 Score  18    Mini-Mental     Office Visit from 05/30/2017 in [place]  Total Score (max 30 points )  27    PHQ2-9     Office Visit from 11/27/2017 in [place] Visit from 05/30/2017 in [facility] Visit from 03/26/2017 in [facility] from [facility] Office Visit from 04/13/2014 in [facility]  PHQ-2 Total Score  3  2  1   0  0  PHQ-9 Total Score  13  6  -  -  -     I reviewed the information below on 09/19/2018 and have updated it Assessment and Plan: MDD-recurrent, moderate without psychotic features; GAD; social anxiety disorder; insomnia; rule out bipolar disorder     Medication management with supportive therapy. Risks and benefits, side effects and alternative treatment options discussed with patient. Pt was given an opportunity to ask questions about medication, illness, and treatment. All current psychiatric medications have been reviewed and discussed with the patient and adjusted as clinically appropriate. The patient has been provided an accurate and updated list of the medications being now prescribed. Patient expressed understanding of how their medications were to be used.  Pt verbalized understanding and verbal consent obtained for treatment.  The risk of un-intended pregnancy is low based on the fact that pt reports she is post menopausal Pt is aware that these meds carry a teratogenic risk. Pt will discuss plan of action if she does or plans to become pregnant in the future.  Status of current problems:  No change in depression or anxiety  Meds:  Lamictal 100 mg p.o. twice daily for MDD Add Lamictal 86m BID for MDD so total dose will be 1252mBID Trazodone 300 mg p.o. nightly for MDD and insomnia D/c Cytomel due to SE Pt is prescribed Neurontin which has an off label indication for anxiety We again discussed her previous med trials and experience with each.  For the most part she tells me that she has had adequate trials but nothing has been effective.   Labs: none today  Therapy: brief supportive therapy provided. Discussed psychosocial stressors in detail. Pt understands that mood is affected by her physical health and until health improves it is unlikely her depression will dramatically improve.  Her diabetes is uncontrolled and she is working with her PCP -We discussed the current pandemic with coronavirus.  I spent a significant amount of time educating her about transmission,  safety precautions, isolation and social distancing, symptoms to monitor for and plan if symptoms develop.  She verbalized understanding  Consultations: none  Pt scheduled to see Neurology on 11/11/2018 due to memory issues  Pt denies SI and is at an acute low risk for suicide. Patient told to call clinic if any problems occur. Patient advised to go to ER if they should develop SI/HI, side effects, or if symptoms worsen. Has crisis numbers to call if needed. Pt verbalized understanding.  F/up in 2 months or sooner if needed  The duration of this appointment visit was 20 minutes of face-to-face time with the patient.  Greater than 50% of this time was spent in counseling, explanation of  diagnosis, planning of further management, and coordination of care   Charlcie Cradle, MD 09/19/2018, 11:09 AM

## 2018-09-24 ENCOUNTER — Other Ambulatory Visit (HOSPITAL_COMMUNITY): Payer: Self-pay | Admitting: Psychiatry

## 2018-09-24 DIAGNOSIS — F332 Major depressive disorder, recurrent severe without psychotic features: Secondary | ICD-10-CM

## 2018-09-25 ENCOUNTER — Other Ambulatory Visit: Payer: Self-pay

## 2018-09-27 ENCOUNTER — Other Ambulatory Visit (HOSPITAL_COMMUNITY): Payer: Self-pay

## 2018-09-27 DIAGNOSIS — F332 Major depressive disorder, recurrent severe without psychotic features: Secondary | ICD-10-CM

## 2018-09-27 MED ORDER — LAMOTRIGINE 25 MG PO TABS: ORAL_TABLET | ORAL | 0 refills | Status: DC

## 2018-10-15 ENCOUNTER — Other Ambulatory Visit: Payer: Self-pay

## 2018-10-15 MED ORDER — INSULIN ASPART 100 UNIT/ML FLEXPEN: PEN_INJECTOR | SUBCUTANEOUS | 3 refills | Status: DC

## 2018-10-22 ENCOUNTER — Other Ambulatory Visit: Payer: Self-pay

## 2018-10-22 ENCOUNTER — Other Ambulatory Visit (INDEPENDENT_AMBULATORY_CARE_PROVIDER_SITE_OTHER): Payer: Medicare HMO

## 2018-10-22 DIAGNOSIS — E1065 Type 1 diabetes mellitus with hyperglycemia: Secondary | ICD-10-CM

## 2018-10-22 LAB — GLUCOSE, RANDOM: Glucose, Bld: 138 mg/dL — ABNORMAL HIGH (ref 70–99)

## 2018-10-22 LAB — HEMOGLOBIN A1C: Hgb A1c MFr Bld: 6.9 % — ABNORMAL HIGH (ref 4.6–6.5)

## 2018-10-24 ENCOUNTER — Encounter: Payer: Self-pay | Admitting: Endocrinology

## 2018-10-24 ENCOUNTER — Ambulatory Visit (INDEPENDENT_AMBULATORY_CARE_PROVIDER_SITE_OTHER): Payer: Medicare HMO | Admitting: Endocrinology

## 2018-10-24 ENCOUNTER — Other Ambulatory Visit: Payer: Self-pay

## 2018-10-24 DIAGNOSIS — E1065 Type 1 diabetes mellitus with hyperglycemia: Secondary | ICD-10-CM

## 2018-10-24 NOTE — Progress Notes (Signed)
Patient ID: Leslie Lane, female   DOB: 1956/03/24, 63 y.o.   MRN: [id number]  Reason for Appointment: Endocrinology follow-up   History of Present Illness    Today's office visit was provided via telemedicine using an audio phone call Explained to the patient and the the limitations of evaluation and management by telemedicine and the availability of in person appointments.  The patient understood the limitations and agreed to proceed. Patient also understood that the telehealth visit is billable. . Location of the patient: Home . Location of the provider: Office Only the patient and myself were participating in the encounter    Diagnosis: Type 1 DIABETES MELITUS, diagnosed 1967      She has had labile blood sugar control over the last several years even though A1c has been usually around 7% She has had less lability and hypoglycemia with taking b.i.d. Lantus compared once a day She has been very sensitive to fast acting insulin and frequently does not require mealtime coverage She cannot tolerate Toujeo because of reported episode of headache, bloating and nausea with the first dose  RECENT history:  Insulin regimen: Tresiba 8 units daily, Novolog 1-2 units up to 4 times a day   Her A1c has been generally close to 7% and now 6.9  Current blood sugar patterns and problems identified:  She is still checking her blood sugars at least 4 times a day at home with her Accu-Chek  She was asked to look into the Dexcom again but she is not comfortable doing this and still not interested  As before her blood sugar readings are very labile at different times  Blood sugar variability is influenced by her inconsistent intake of foods and snacks especially carbohydrate and junk food  FASTING blood sugars are somewhat variable and at times is 349 and as low as 65  Average blood sugar is the highest in the mornings overall and similar to her previous visit  As before with  increased physical activity at times she will begin to get low blood sugars.  This can happen at anytime of the day  However has had only 1 low blood sugar overnight which was at midnight and she cannot explain this  She has been previously told to minimize her use of NovoLog because of sensitivity and she will mostly take 1 unit with blood sugars over 200, may take 2 units if blood sugars are much higher  She will not however sometimes eat right away after taking a correction dose in the morning causing low blood sugars  She will also take some NovoLog late at night when she is eating more junk food  Not clear if her weight has changed   HYPOGLYCEMIA: Lowest blood sugar 39 at midnight and recently most of her low sugars are in the late afternoon  May not have symptoms with low blood sugars and does not have early warning symptoms. Gets confused frequently and she depends on her mother to recognize low sugars and treat them.  Her mother knows how to give Glucagon injection although on the last injection apparently the needle broke off and she had to call EMS  She will treat her low blood sugars usually with juice, does carry glucose tablets with her      DIET: She is trying to get protein with eggs at breakfast Meal times: Variable.Marland Kitchen  She will eat more when she is anxious  Monitors blood glucose:  about 4-5 times a day.  Glucometer:Accu-Chek Aviva   PRE-MEAL Fasting Lunch Dinner Bedtime Overall  Glucose range:  85-349   36-269  33-205  33-349  Mean/median:  178  118  130  103  131   PREVIOUS readings:  PRE-MEAL Fasting Lunch Dinner Bedtime Overall  Glucose range:  50-299    43-321  23-354  Mean/median:  174  140  115  133  137+/-70   POST-MEAL PC Breakfast PC Lunch PC Dinner  Glucose range:     Mean/median:   121        Wt Readings from Last 3 Encounters:  08/21/18 105 lb (47.6 kg)  06/17/18 106 lb 3.2 oz (48.2 kg)  06/04/18 105 lb (47.6 kg)    Lab Results   Component Value Date   HGBA1C 6.9 (H) 10/22/2018   HGBA1C 7.3 (H) 06/13/2018   HGBA1C 6.9 (H) 03/15/2018   Lab Results  Component Value Date   MICROALBUR 0.9 12/13/2017   LDLCALC 79 06/13/2018   CREATININE 0.79 06/13/2018        Allergies as of 10/24/2018      Reactions   Penicillins Anaphylaxis   Sulfonamide Derivatives Anaphylaxis      Medication List       Accurate as of October 24, 2018 10:23 AM. Always use your most recent med list.        Accu-Chek Aviva Plus w/Device Kit USE AS DIRECTED   Accu-Chek Aviva Soln USE AS DIRECTED   Accu-Chek Softclix Lancets lancets Use as instructed to check blood sugar 4 times per day dx code E10.65   aspirin EC 81 MG tablet Take 81 mg by mouth every morning.   B-D SINGLE USE SWABS REGULAR Pads USE FOUR TIMES DAILY   diclofenac sodium 1 % Gel Commonly known as:  VOLTAREN APPLY 2 GRAMS EXTERNALLY TO THE AFFECTED AREA FOUR TIMES DAILY   Fish Oil 1000 MG Caps Take 1 capsule by mouth daily.   gabapentin 100 MG capsule Commonly known as:  NEURONTIN TAKE 1 TO 2 CAPSULES BY MOUTH TWICE DAILY   glucagon 1 MG Solr injection Commonly known as:  GlucaGen HypoKit Inject 1 mg into the skin once as needed for up to 1 dose for low blood sugar.   glucose blood test strip Commonly known as:  Accu-Chek Aviva Plus TEST BLOOD SUGAR FOUR TIMES DAILY   insulin aspart 100 UNIT/ML FlexPen Commonly known as:  NovoLOG FlexPen INJECT 1-3 UNITS UNDER THE SKIN UP TO 4 TIMES DAILY.   insulin degludec 100 UNIT/ML Sopn FlexTouch Pen Commonly known as:  Tyler Aas FlexTouch Inject 0.15 mLs (15 Units total) into the skin daily.   Insulin Pen Needle 31G X 8 MM Misc Commonly known as:  B-D ULTRAFINE III SHORT PEN USE TO INJECT INSULIN 4 TIMES DAILY.   lamoTRIgine 100 MG tablet Commonly known as:  LAMICTAL Take 1 tablet (100 mg total) by mouth 2 (two) times daily.   lamoTRIgine 25 MG tablet Commonly known as:  LaMICtal Take one tablet daily  with the 100 mg tab (167m) po BID   lidocaine 5 % Commonly known as:  Lidoderm Place 1 patch onto the skin daily. Remove & Discard patch within 12 hours or as directed by MD   lipase/protease/amylase 36000 UNITS Cpep capsule Commonly known as:  Creon Take 2 capsules (72,000 Units total) by mouth 3 (three) times daily before meals.   loratadine 10 MG tablet Commonly known as:  CLARITIN Take 10 mg by mouth as needed for allergies.  lovastatin 40 MG tablet Commonly known as:  MEVACOR Take 1 tablet (40 mg total) by mouth at bedtime.   Magnesium 500 MG Tabs Take 1 tablet by mouth as needed.   metoCLOPramide 5 MG tablet Commonly known as:  REGLAN TAKE 1 TABLET THREE TIMES DAILY BEFORE MEALS   multivitamin tablet Take 1 tablet by mouth daily. Reported on 11/22/2015   omeprazole 40 MG capsule Commonly known as:  PRILOSEC TAKE 1 CAPSULE(40 MG) BY MOUTH DAILY   traMADol 50 MG tablet Commonly known as:  ULTRAM TAKE 1 TABLET BY MOUTH EVERY OTHER DAY   trazodone 300 MG tablet Commonly known as:  DESYREL Take 1 tablet (300 mg total) by mouth at bedtime.   vitamin A 10000 UNIT capsule Take 10,000 Units by mouth daily.   vitamin B-12 1000 MCG tablet Commonly known as:  CYANOCOBALAMIN Take 1 tablet by mouth daily.   vitamin E 1000 UNIT capsule Generic drug:  vitamin E Take 1,000 Units by mouth daily.       Allergies:  Allergies  Allergen Reactions  . Penicillins Anaphylaxis  . Sulfonamide Derivatives Anaphylaxis    Past Medical History:  Diagnosis Date  . ALLERGIC RHINITIS   . Allergy   . Anemia   . Anxiety   . Arthritis   . BIPOLAR AFFECTIVE DISORDER   . Cataract   . DEPRESSION   . Diabetes mellitus type I ([place])   . DIABETES MELLITUS, TYPE II    follows with endo  . Fibromyalgia   . GERD   . HEPATITIS B   . HYPERLIPIDEMIA   . LOW BACK PAIN, CHRONIC   . MIGRAINE HEADACHE   . NARCOTIC ABUSE    hx of  . NECK PAIN, CHRONIC   . Neuropathy   . Seizures  ([place])    pt states, "if my blood sugar drops to the 20's, I convulse.  It hasn't happened in a long time."  . SMOKER     Past Surgical History:  Procedure Laterality Date  . CESAREAN SECTION    . COLONOSCOPY    . TUBAL LIGATION    . UPPER GASTROINTESTINAL ENDOSCOPY      Family History  Problem Relation Age of Onset  . Arthritis Mother   . Colon cancer Mother        ? age of dx  . Arthritis Father   . Kidney disease Father   . Kidney cancer Father   . Heart attack Brother   . Bladder Cancer Sister   . Hyperlipidemia Other   . Kidney cancer Paternal Aunt   . Anxiety disorder Neg Hx   . Bipolar disorder Neg Hx   . Depression Neg Hx   . Breast cancer Neg Hx   . Esophageal cancer Neg Hx   . Stomach cancer Neg Hx   . Rectal cancer Neg Hx     Social History:  reports that she has been smoking cigarettes. She has a 60.00 pack-year smoking history. She has never used smokeless tobacco. She reports current alcohol use. She reports that she does not use drugs.  Review of Systems:   Has probable gastroparesis treated with Reglan as needed  NEUROPATHY: She has burning in her legs and feet at times treated with gabapentin   DEPRESSION: She has had long-standing depression and anxiety with variable level of control and is on Lamictal  HYPERLIPIDEMIA: The lipid abnormality consists of elevated LDL   Is  on lovastatin 40 mg with  good control  Lab Results  Component Value Date   CHOL 173 06/13/2018   HDL 79.20 06/13/2018   LDLCALC 79 06/13/2018   LDLDIRECT 101.7 03/26/2012   TRIG 73.0 06/13/2018   CHOLHDL 2 06/13/2018    Memory difficulties: She has discussed with PCP    Examination:   There were no vitals taken for this visit.  There is no height or weight on file to calculate BMI.    ASSESSMENT/ PLAN:   Diabetes type 1 with poor control  See history of present illness for detailed discussion of current insulin, blood sugar patterns, problems identified  A1c  has been usually fairly good, now 6.9 and relatively better  Blood sugar patterns are discussed above She is again having labile blood sugars with periodic hypoglycemia Also has history of hypoglycemia unawareness She is reluctant to use the Dexcom sensor again for practical reasons and difficulty with daily technology part Also discussed that in the future she may be benefiting from the closed-loop insulin pump but this would still require a sensor  Fasting readings are relatively high on average but she may have normal or low normal readings at times also with current dose of 8 units of Tresiba Will not increase her Tyler Aas because of her tendency to random low sugars during the day even without any additional NovoLog secondary to activity levels for which she does not compensate with extra snacks all the time  Discussed her day-to-day management Advised her not to wait more than 15 minutes if she takes a correction dose of NovoLog to eat a meal  Follow-up in 3 months To recheck microalbumin on next visit  Total visit time on phone call =6 minutes   There are no Patient Instructions on file for this visit.     [name] 10/24/2018, 10:23 AM

## 2018-11-04 ENCOUNTER — Other Ambulatory Visit: Payer: Self-pay | Admitting: Internal Medicine

## 2018-11-04 NOTE — Telephone Encounter (Signed)
Control database checked last refill: 02/01/2018  LOV: 06/04/2018 CPE NOV:12/04/2018

## 2018-11-04 NOTE — Telephone Encounter (Signed)
This should not be refilled as it was an acute medication and not chronic.

## 2018-11-11 ENCOUNTER — Ambulatory Visit: Payer: Self-pay | Admitting: Neurology

## 2018-11-21 ENCOUNTER — Other Ambulatory Visit: Payer: Self-pay

## 2018-11-21 ENCOUNTER — Ambulatory Visit (INDEPENDENT_AMBULATORY_CARE_PROVIDER_SITE_OTHER): Payer: Medicare HMO | Admitting: Psychiatry

## 2018-11-21 ENCOUNTER — Encounter (HOSPITAL_COMMUNITY): Payer: Self-pay | Admitting: Psychiatry

## 2018-11-21 DIAGNOSIS — F332 Major depressive disorder, recurrent severe without psychotic features: Secondary | ICD-10-CM

## 2018-11-21 DIAGNOSIS — G4701 Insomnia due to medical condition: Secondary | ICD-10-CM

## 2018-11-21 DIAGNOSIS — F4010 Social phobia, unspecified: Secondary | ICD-10-CM | POA: Diagnosis not present

## 2018-11-21 MED ORDER — TRAZODONE HCL 300 MG PO TABS: 300.0000 mg | ORAL_TABLET | Freq: Every day | ORAL | 0 refills | Status: DC

## 2018-11-21 MED ORDER — LAMOTRIGINE 100 MG PO TABS: 100.0000 mg | ORAL_TABLET | Freq: Two times a day (BID) | ORAL | 0 refills | Status: DC

## 2018-11-21 NOTE — Progress Notes (Signed)
11/21/2018 12:13 PM Leslie Lane  MRN:  [id number]   Virtual Visit via Telephone Note  I connected with [name] on 11/21/18 at 10:45 AM EDT by telephone and verified that I am speaking with the correct person using two identifiers.  Location: Patient: home Provider: home   I discussed the limitations, risks, security and privacy concerns of performing an evaluation and management service by telephone and the availability of in person appointments. I also discussed with the patient that there may be a patient responsible charge related to this service. The patient expressed understanding and agreed to proceed.    Chief Complaint:  Chief Complaint    Depression; Insomnia; Follow-up     HPI:  Pt's father died on 2022/10/16. She was very nervous and upset after that. Pt is trying to deal with things day by day. She is sleeping better lately. She still pictures her father in the hospice bed when he died. It still upsets her and she is still grieving. Pt has a number of family stressors. Leslie Lane states it makes her angry and irritable. She will sometimes lashes out. Her frustration tolerance remains low. Her depression is ongoing. She is eating a lot at bedtime due to anxiety. Her blood sugar is high in the AM as a result. She is unmotivated and always tired. Lamictal 143m was causing more fatigue so she stopped the extra 246mafter 3 days.  She rarely leaves her home and only goes out for errands. Memory issues continue and she is unable to focus on anything.  She has a new appt with neurology on June 19th. She denies SI/HI.    Visit Diagnosis:    ICD-10-CM   1. Major depressive disorder, recurrent, severe without psychotic features (HC[facility]F33.2   2. Social anxiety disorder F40.10       Past Psychiatric History:  Anxiety: Yes Bipolar Disorder: No Depression: Yes Mania: No Psychosis: No Schizophrenia: No Personality Disorder: No  Hospitalization for psychiatric illness:  No History of Electroconvulsive Shock Therapy: No Prior Suicide Attempts: Yes  Seroquel, Trazodone-ineffective   Lexapro   Depakote, Lithium  Effexor and Cymbalta- ineffective   Wellbutrin- ineffective   Abilify- ineffective  Paxil-effective but not the second time it was prescribed          Past Medical History:  Past Medical History:  Diagnosis Date  . ALLERGIC RHINITIS   . Allergy   . Anemia   . Anxiety   . Arthritis   . BIPOLAR AFFECTIVE DISORDER   . Cataract   . DEPRESSION   . Diabetes mellitus type I ([facility]  . DIABETES MELLITUS, TYPE II    follows with endo  . Fibromyalgia   . GERD   . HEPATITIS B   . HYPERLIPIDEMIA   . LOW BACK PAIN, CHRONIC   . MIGRAINE HEADACHE   . NARCOTIC ABUSE    hx of  . NECK PAIN, CHRONIC   . Neuropathy   . Seizures ([facility]COna   pt states, "if my blood sugar drops to the 20's, I convulse.  It hasn't happened in a long time."  . SMOKER     Past Surgical History:  Procedure Laterality Date  . CESAREAN SECTION    . COLONOSCOPY    . TUBAL LIGATION    . UPPER GASTROINTESTINAL ENDOSCOPY      Family Psychiatric History:  Family History  Problem Relation Age of Onset  . Arthritis Mother   . Colon cancer Mother        ?  age of dx  . Arthritis Father   . Kidney disease Father   . Kidney cancer Father   . Heart attack Brother   . Bladder Cancer Sister   . Hyperlipidemia Other   . Kidney cancer Paternal Aunt   . Anxiety disorder Neg Hx   . Bipolar disorder Neg Hx   . Depression Neg Hx   . Breast cancer Neg Hx   . Esophageal cancer Neg Hx   . Stomach cancer Neg Hx   . Rectal cancer Neg Hx     Social History:  Social History   Socioeconomic History  . Marital status: Divorced    Spouse name: Not on file  . Number of children: 1  . Years of education: Not on file  . Highest education level: Not on file  Occupational History  . Occupation: diabled  Social Needs  . Financial resource strain: Not on file  . Food  insecurity:    Worry: Not on file    Inability: Not on file  . Transportation needs:    Medical: Not on file    Non-medical: Not on file  Tobacco Use  . Smoking status: Current Every Day Smoker    Packs/day: 1.50    Years: 40.00    Pack years: 60.00    Types: Cigarettes  . Smokeless tobacco: Never Used  . Tobacco comment: tobacco info given 04/18/16  Substance and Sexual Activity  . Alcohol use: Yes    Alcohol/week: 0.0 standard drinks    Comment: occasionally- once every 2 weeks 1-2 beers  . Drug use: No  . Sexual activity: Never    Birth control/protection: Surgical, Abstinence  Lifestyle  . Physical activity:    Days per week: Not on file    Minutes per session: Not on file  . Stress: Not on file  Relationships  . Social connections:    Talks on phone: Not on file    Gets together: Not on file    Attends religious service: Not on file    Active member of club or organization: Not on file    Attends meetings of clubs or organizations: Not on file    Relationship status: Not on file  Other Topics Concern  . Not on file  Social History Narrative   Divorced, disabled   Lives with parents    Allergies:  Allergies  Allergen Reactions  . Penicillins Anaphylaxis  . Sulfonamide Derivatives Anaphylaxis    Metabolic Disorder Labs: Lab Results  Component Value Date   HGBA1C 6.9 (H) 10/22/2018   No results found for: PROLACTIN Lab Results  Component Value Date   CHOL 173 06/13/2018   TRIG 73.0 06/13/2018   HDL 79.20 06/13/2018   CHOLHDL 2 06/13/2018   VLDL 14.6 06/13/2018   LDLCALC 79 06/13/2018   LDLCALC 81 06/04/2018   Lab Results  Component Value Date   TSH 1.78 06/13/2018   TSH 2.59 06/04/2018    Therapeutic Level Labs: No results found for: LITHIUM No results found for: VALPROATE No components found for:  CBMZ  Current Medications: Current Outpatient Medications  Medication Sig Dispense Refill  . ACCU-CHEK SOFTCLIX LANCETS lancets Use as  instructed to check blood sugar 4 times per day dx code E10.65 (Patient taking differently: Use as instructed to check blood sugar 8 times per day dx code E10.65) 400 each 1  . Alcohol Swabs (B-D SINGLE USE SWABS REGULAR) PADS USE FOUR TIMES DAILY 400 each 2  . aspirin EC 81 MG  tablet Take 81 mg by mouth every morning.    . Blood Glucose Calibration (ACCU-CHEK AVIVA) SOLN USE AS DIRECTED 1 each 1  . Blood Glucose Monitoring Suppl (ACCU-CHEK AVIVA PLUS) w/Device KIT USE AS DIRECTED 1 kit 0  . diclofenac sodium (VOLTAREN) 1 % GEL APPLY 2 GRAMS EXTERNALLY TO THE AFFECTED AREA FOUR TIMES DAILY 100 g 0  . gabapentin (NEURONTIN) 100 MG capsule TAKE 1 TO 2 CAPSULES BY MOUTH TWICE DAILY 120 capsule 2  . glucagon (GLUCAGEN HYPOKIT) 1 MG SOLR injection Inject 1 mg into the skin once as needed for up to 1 dose for low blood sugar. 2 each 2  . glucose blood (ACCU-CHEK AVIVA PLUS) test strip TEST BLOOD SUGAR FOUR TIMES DAILY 400 each 3  . insulin aspart (NOVOLOG FLEXPEN) 100 UNIT/ML FlexPen INJECT 1-3 UNITS UNDER THE SKIN UP TO 4 TIMES DAILY. 15 mL 3  . insulin degludec (TRESIBA FLEXTOUCH) 100 UNIT/ML SOPN FlexTouch Pen Inject 0.15 mLs (15 Units total) into the skin daily. 15 pen 1  . Insulin Pen Needle (B-D ULTRAFINE III SHORT PEN) 31G X 8 MM MISC USE TO INJECT INSULIN 4 TIMES DAILY. 100 each 0  . lamoTRIgine (LAMICTAL) 100 MG tablet Take 1 tablet (100 mg total) by mouth 2 (two) times daily. 180 tablet 0  . lidocaine (LIDODERM) 5 % UNWRAP AND APPLY 1 PATCH TO THE SKIN DAILY( REMOVE AND DISCARD PATCH WITHIN 12 HOURS OR AS DIRECTED BY DOCTOR) 30 patch 0  . lipase/protease/amylase (CREON) 36000 UNITS CPEP capsule Take 2 capsules (72,000 Units total) by mouth 3 (three) times daily before meals. 300 capsule 3  . loratadine (CLARITIN) 10 MG tablet Take 10 mg by mouth as needed for allergies.    Marland Kitchen lovastatin (MEVACOR) 40 MG tablet Take 1 tablet (40 mg total) by mouth at bedtime. 90 tablet 2  . Magnesium 500 MG TABS  Take 1 tablet by mouth as needed.    . metoCLOPramide (REGLAN) 5 MG tablet TAKE 1 TABLET THREE TIMES DAILY BEFORE MEALS 270 tablet 1  . Multiple Vitamin (MULTIVITAMIN) tablet Take 1 tablet by mouth daily. Reported on 11/22/2015    . Omega-3 Fatty Acids (FISH OIL) 1000 MG CAPS Take 1 capsule by mouth daily.    Marland Kitchen omeprazole (PRILOSEC) 40 MG capsule TAKE 1 CAPSULE(40 MG) BY MOUTH DAILY 90 capsule 2  . traMADol (ULTRAM) 50 MG tablet TAKE 1 TABLET BY MOUTH EVERY OTHER DAY 15 tablet 0  . trazodone (DESYREL) 300 MG tablet Take 1 tablet (300 mg total) by mouth at bedtime. 90 tablet 0  . vitamin A 10000 UNIT capsule Take 10,000 Units by mouth daily.    . vitamin B-12 (CYANOCOBALAMIN) 1000 MCG tablet Take 1 tablet by mouth daily.    . vitamin E (VITAMIN E) 1000 UNIT capsule Take 1,000 Units by mouth daily.       No current facility-administered medications for this visit.      MSE: I spoke with Reise on the phone.  She was engaged in the conversation and answered questions appropriately.  Her speech was with increased rate per her usual.  Tone and volume were normal.  Thought processes are coherent and circumstantial.  Thought processes are with ruminations.  Mood is depressed and anxious.  Her affect is congruent.  She denies SI/HI she denies AVH and did not appear to be responding to internal stimuli.  Use of language and fund of knowledge are average.  Attention, concentration and memory were on the poor  side.  Insight is fair judgment is fair.  I am unable to comment on her physical appearance, eye contact or psychomotor activity as I was unable to physically see the patient       Screenings: GAD-7     Office Visit from 11/27/2017 in [place]  Total GAD-7 Score  18    Mini-Mental     Office Visit from 05/30/2017 in [place]  Total Score (max 30 points )  27    PHQ2-9     Office Visit from 11/27/2017 in [place] Visit from 05/30/2017 in [place] Visit from 03/26/2017 in [place] from [facility] Office Visit from 04/13/2014 in [facility]  PHQ-2 Total Score  3  2  1   0  0  PHQ-9 Total Score  13  6  -  -  -       I reviewed the information below on Nov 21, 2018 and have updated it Assessment and Plan: MDD-recurrent, moderate without psychotic features; GAD; social anxiety disorder; insomnia; rule out bipolar disorder     Medication management with supportive therapy. Risks and benefits, side effects and alternative treatment options discussed with patient. Pt was given an opportunity to ask questions about medication, illness, and treatment. All current psychiatric medications have been reviewed and discussed with the patient and adjusted as clinically appropriate. The patient has been provided an accurate and updated list of the medications being now prescribed. Patient expressed understanding of how their medications were to be used.  Pt verbalized understanding and verbal consent obtained for treatment.  The risk of un-intended pregnancy is low based on the fact that pt reports she is post menopausal Pt is aware that these meds carry a teratogenic risk. Pt will discuss plan of action if she does or plans to become pregnant in the future.  Status of current problems: ongoing depression and anxiety  Meds:  Lamictal 100 mg p.o. twice daily for MDD Trazodone 300 mg p.o. nightly for MDD and insomnia D/c Lamictal 42m BID as patient reports she stopped it after 3 days due to fatigue Pt is prescribed Neurontin which has an off label indication for anxiety We again discussed her previous med trials and experience with each.  For the most part she tells me that she has had adequate trials but nothing has been effective. - discussed [facility] possible option,  pt will consider it and would like more information.  She is very concerned about the cost.  Labs: none today  Therapy: brief supportive therapy provided. Discussed psychosocial stressors in detail. Pt understands that mood is affected by her physical health and until health improves it is unlikely her depression will dramatically improve.  Her diabetes is uncontrolled and she is working with her PCP   Consultations: declined therapy  Pt scheduled to see Neurology in June 2020 due to memory issues  Pt denies SI and is at an acute low risk for suicide. Patient told to call clinic if any problems occur. Patient advised to go to ER if they should develop SI/HI, side effects, or if symptoms worsen. Has crisis numbers to call if needed. Pt verbalized understanding.  F/up in 2 months or sooner if needed  The duration of this appointment visit was 20 minutes of face-to-face time with the patient.  Greater than 50% of  this time was spent in counseling, explanation of  diagnosis, planning of further management, and coordination of care   Charlcie Cradle, MD 11/21/2018, 12:13 PM

## 2018-11-22 ENCOUNTER — Other Ambulatory Visit: Payer: Self-pay | Admitting: Endocrinology

## 2018-11-25 ENCOUNTER — Other Ambulatory Visit (HOSPITAL_COMMUNITY): Payer: Self-pay | Admitting: Psychiatry

## 2018-12-04 ENCOUNTER — Ambulatory Visit: Payer: Self-pay | Admitting: Internal Medicine

## 2018-12-05 ENCOUNTER — Ambulatory Visit (INDEPENDENT_AMBULATORY_CARE_PROVIDER_SITE_OTHER): Payer: Medicare HMO | Admitting: Internal Medicine

## 2018-12-05 ENCOUNTER — Other Ambulatory Visit: Payer: Self-pay

## 2018-12-05 ENCOUNTER — Encounter: Payer: Self-pay | Admitting: Internal Medicine

## 2018-12-05 VITALS — BP 118/72 | HR 76 | Temp 98.0°F | Ht 63.5 in | Wt 107.0 lb

## 2018-12-05 DIAGNOSIS — G8929 Other chronic pain: Secondary | ICD-10-CM | POA: Diagnosis not present

## 2018-12-05 DIAGNOSIS — J410 Simple chronic bronchitis: Secondary | ICD-10-CM | POA: Diagnosis not present

## 2018-12-05 DIAGNOSIS — M545 Low back pain, unspecified: Secondary | ICD-10-CM

## 2018-12-05 DIAGNOSIS — R109 Unspecified abdominal pain: Secondary | ICD-10-CM | POA: Diagnosis not present

## 2018-12-05 MED ORDER — LIDOCAINE 5 % EX PTCH: 1.0000 | MEDICATED_PATCH | CUTANEOUS | 5 refills | Status: DC

## 2018-12-05 MED ORDER — TRAMADOL HCL 50 MG PO TABS: 50.0000 mg | ORAL_TABLET | ORAL | 0 refills | Status: DC

## 2018-12-05 MED ORDER — DICLOFENAC SODIUM 1 % TD GEL: TRANSDERMAL | 6 refills | Status: DC

## 2018-12-05 NOTE — Progress Notes (Signed)
   Subjective:   Patient ID: Leslie Lane, female    DOB: January 16, 1956, 63 y.o.   MRN: [id number]  HPI The patient is a 63 YO female coming in for follow up of her stomach pain (using creon and diarrhea is under reasonable control, denies nausea or vomiting but using reglan regularly, denies blood in stool, denies change in diet, is up a few pounds and is happy about that) and back pain (out of tramadol and using lidocaine patches, is in miserable pain, denies new injury or fall, denies new numbness or weakness in the legs, does continue to smoke and is smoking more than usual), and smoking (she is smoking more than usual due to pandemic and stress, does have worsening cough, congestion which she cannot get up, uses mucinex but this is not helping anymore, denies fevers or chills or worsening SOB).  Review of Systems  Constitutional: Positive for activity change.  HENT: Negative.   Eyes: Negative.   Respiratory: Positive for cough and shortness of breath. Negative for chest tightness.   Cardiovascular: Negative for chest pain, palpitations and leg swelling.  Gastrointestinal: Positive for abdominal pain. Negative for abdominal distention, constipation, diarrhea, nausea and vomiting.  Musculoskeletal: Positive for arthralgias, back pain and myalgias.  Skin: Negative.   Neurological: Negative.   Psychiatric/Behavioral: Positive for decreased concentration, dysphoric mood and sleep disturbance. The patient is nervous/anxious.     Objective:  Physical Exam Constitutional:      Appearance: She is well-developed.     Comments: thin  HENT:     Head: Normocephalic and atraumatic.  Neck:     Musculoskeletal: Normal range of motion.  Cardiovascular:     Rate and Rhythm: Normal rate and regular rhythm.  Pulmonary:     Effort: Pulmonary effort is normal. No respiratory distress.     Breath sounds: Normal breath sounds. No wheezing or rales.  Abdominal:     General: Bowel sounds are normal.  There is no distension.     Palpations: Abdomen is soft.     Tenderness: There is abdominal tenderness. There is no rebound.     Comments: Stable tenderness, no rebound or guarding  Musculoskeletal:        General: Tenderness present.  Skin:    General: Skin is warm and dry.  Neurological:     Mental Status: She is alert and oriented to person, place, and time.     Coordination: Coordination normal.     Vitals:   12/05/18 1037  BP: 118/72  Pulse: 76  Temp: 98 F (36.7 C)  TempSrc: Oral  SpO2: 95%  Weight: 107 lb (48.5 kg)  Height: 5' 3.5" (1.613 m)    Assessment & Plan:

## 2018-12-05 NOTE — Assessment & Plan Note (Signed)
Still present and worsening with increasing smoking recently. Counseled to quit and she will think about it but is uninterested at this time. She is aware of the long term risks and we talked about those complications which she has already as well as how they could worsen or other problems she could have.

## 2018-12-05 NOTE — Patient Instructions (Addendum)
We have sent in the refills of the medicines.

## 2018-12-05 NOTE — Assessment & Plan Note (Signed)
Rx for lidocaine patches and #15 tramadol. She is not to use tramadol regularly. Past hx addiction to cocaine and opiates.

## 2018-12-05 NOTE — Assessment & Plan Note (Signed)
Chronic and stable and may be related to her long term diabetes. She is taking reglan which is helping some. She is also using creon for diarrhea which has helped improve symptoms as well.

## 2018-12-15 DIAGNOSIS — E162 Hypoglycemia, unspecified: Secondary | ICD-10-CM | POA: Diagnosis not present

## 2018-12-15 DIAGNOSIS — R000 Tachycardia, unspecified: Secondary | ICD-10-CM | POA: Diagnosis not present

## 2018-12-15 DIAGNOSIS — R0902 Hypoxemia: Secondary | ICD-10-CM | POA: Diagnosis not present

## 2018-12-15 DIAGNOSIS — E161 Other hypoglycemia: Secondary | ICD-10-CM | POA: Diagnosis not present

## 2018-12-15 DIAGNOSIS — R456 Violent behavior: Secondary | ICD-10-CM | POA: Diagnosis not present

## 2018-12-18 ENCOUNTER — Other Ambulatory Visit: Payer: Self-pay

## 2018-12-18 ENCOUNTER — Ambulatory Visit: Payer: Medicare HMO | Admitting: Neurology

## 2018-12-18 ENCOUNTER — Encounter: Payer: Self-pay | Admitting: Neurology

## 2018-12-18 VITALS — BP 118/69 | HR 70 | Temp 97.9°F | Ht 63.5 in | Wt 108.2 lb

## 2018-12-18 DIAGNOSIS — G3184 Mild cognitive impairment, so stated: Secondary | ICD-10-CM | POA: Diagnosis not present

## 2018-12-18 MED ORDER — DONEPEZIL HCL 10 MG PO TABS: ORAL_TABLET | ORAL | 11 refills | Status: DC

## 2018-12-18 NOTE — Progress Notes (Signed)
NEUROLOGY CONSULTATION NOTE  Leslie Lane MRN: [id number] DOB: [birth year]  Referring provider: Dr. [name] Primary care provider: Dr. Pricilla Holm  Reason for consult:  Memory loss  Dear Dr Dwyane Dee:  Thank you for your kind referral of Leslie Lane for consultation of the above symptoms. Although her history is well known to you, please allow me to reiterate it for the purpose of our medical record. She is alone in the office today. Records and images were personally reviewed where available.  HISTORY OF PRESENT ILLNESS: This is a 63 year old woman with a history of type I DM, hyperlipidemia, fibromyalgia, migraines, major depressive disorder, GAD, rule out bipolar disorder, presenting for evaluation of memory loss. She is alone in the office with no family to corroborate history. She states memory changes started after she was in the hospital in a diabetic coma at age 58. She states she has not noticed any changes since then, memory has been about the same since then. She initially could not drive a car or remember a movie she had watched. She acquired a guardian to manage her finances. She lives with her mother. She has been told she repeats herself and says the same thing every morning. She used to leave the stove on, but has been more vigilant. She has been driving and has gotten lost driving in unfamiliar roads. She manages her medications independently. She states she does not like math and was not good in school. She has reported her memory concerns to her psychiatrist and reported that she cannot focus on anything. She is easily irritable. No paranoia or hallucinations. Sleep is good with Trazodone. No family history of dementia. No history of significant head injuries. She used to drink heavily when younger, then cut down to a beer every now and then after her diabetic coma.   She has headaches that improve when she is not smoking. She has a history of migraines and has mild  bitemporal headaches, taking daily magnesium. She has chronic neck and back pain and neuropathy in her feet. No dizziness, vision changes, focal weakness, bowel/bladder dysfunction, anosmia, or tremors.   Laboratory Data: Lab Results  Component Value Date   TSH 1.78 06/13/2018   Lab Results  Component Value Date   [id number] 035 (H) 06/04/2018   Lab Results  Component Value Date   HGBA1C 6.9 (H) 10/22/2018      PAST MEDICAL HISTORY: Past Medical History:  Diagnosis Date  . ALLERGIC RHINITIS   . Allergy   . Anemia   . Anxiety   . Arthritis   . BIPOLAR AFFECTIVE DISORDER   . Cataract   . DEPRESSION   . Diabetes mellitus type I ([place])   . DIABETES MELLITUS, TYPE II    follows with endo  . Fibromyalgia   . GERD   . HEPATITIS B   . HYPERLIPIDEMIA   . LOW BACK PAIN, CHRONIC   . MIGRAINE HEADACHE   . NARCOTIC ABUSE    hx of  . NECK PAIN, CHRONIC   . Neuropathy   . Seizures ([place] II)    pt states, "if my blood sugar drops to the 20's, I convulse.  It hasn't happened in a long time."  . SMOKER     PAST SURGICAL HISTORY: Past Surgical History:  Procedure Laterality Date  . CESAREAN SECTION    . COLONOSCOPY    . TUBAL LIGATION    . UPPER GASTROINTESTINAL ENDOSCOPY      MEDICATIONS: Current  Outpatient Medications on File Prior to Visit  Medication Sig Dispense Refill  . ACCU-CHEK SOFTCLIX LANCETS lancets Use as instructed to check blood sugar 4 times per day dx code E10.65 (Patient taking differently: Use as instructed to check blood sugar 8 times per day dx code E10.65) 400 each 1  . Alcohol Swabs (B-D SINGLE USE SWABS REGULAR) PADS USE FOUR TIMES DAILY 400 each 2  . aspirin EC 81 MG tablet Take 81 mg by mouth every morning.    . Blood Glucose Calibration (ACCU-CHEK AVIVA) SOLN USE AS DIRECTED 1 each 1  . Blood Glucose Monitoring Suppl (ACCU-CHEK AVIVA PLUS) w/Device KIT USE AS DIRECTED 1 kit 0  . diclofenac sodium (VOLTAREN) 1 % GEL APPLY 2 GRAMS EXTERNALLY TO THE  AFFECTED AREA FOUR TIMES DAILY 100 g 6  . gabapentin (NEURONTIN) 100 MG capsule TAKE 1 TO 2 CAPSULES BY MOUTH TWICE DAILY 120 capsule 2  . glucagon (GLUCAGEN HYPOKIT) 1 MG SOLR injection Inject 1 mg into the skin once as needed for up to 1 dose for low blood sugar. 2 each 2  . glucose blood (ACCU-CHEK AVIVA PLUS) test strip TEST BLOOD SUGAR FOUR TIMES DAILY 400 each 3  . insulin aspart (NOVOLOG FLEXPEN) 100 UNIT/ML FlexPen INJECT 1-3 UNITS UNDER THE SKIN UP TO 4 TIMES DAILY. 15 mL 3  . insulin degludec (TRESIBA FLEXTOUCH) 100 UNIT/ML SOPN FlexTouch Pen Inject 0.15 mLs (15 Units total) into the skin daily. 15 pen 1  . Insulin Pen Needle (B-D ULTRAFINE III SHORT PEN) 31G X 8 MM MISC USE TO INJECT INSULIN 4 TIMES DAILY. 100 each 0  . lamoTRIgine (LAMICTAL) 100 MG tablet Take 1 tablet (100 mg total) by mouth 2 (two) times daily. 180 tablet 0  . lidocaine (LIDODERM) 5 % Place 1 patch onto the skin daily. Remove & Discard patch within 12 hours or as directed by MD 30 patch 5  . lipase/protease/amylase (CREON) 36000 UNITS CPEP capsule Take 2 capsules (72,000 Units total) by mouth 3 (three) times daily before meals. 300 capsule 3  . loratadine (CLARITIN) 10 MG tablet Take 10 mg by mouth as needed for allergies.    Marland Kitchen lovastatin (MEVACOR) 40 MG tablet Take 1 tablet (40 mg total) by mouth at bedtime. 90 tablet 2  . Magnesium 500 MG TABS Take 1 tablet by mouth as needed.    . metoCLOPramide (REGLAN) 5 MG tablet TAKE 1 TABLET THREE TIMES DAILY as needed BEFORE MEALS 270 tablet 1  . Multiple Vitamin (MULTIVITAMIN) tablet Take 1 tablet by mouth daily. Reported on 11/22/2015    . Omega-3 Fatty Acids (FISH OIL) 1000 MG CAPS Take 1 capsule by mouth daily.    Marland Kitchen omeprazole (PRILOSEC) 40 MG capsule TAKE 1 CAPSULE(40 MG) BY MOUTH DAILY 90 capsule 2  . traMADol (ULTRAM) 50 MG tablet Take 1 tablet (50 mg total) by mouth every other day. 15 tablet 0  . trazodone (DESYREL) 300 MG tablet Take 1 tablet (300 mg total) by mouth  at bedtime. 90 tablet 0  . vitamin B-12 (CYANOCOBALAMIN) 1000 MCG tablet Take 1 tablet by mouth daily.    . vitamin E (VITAMIN E) 1000 UNIT capsule Take 1,000 Units by mouth daily.       No current facility-administered medications on file prior to visit.     ALLERGIES: Allergies  Allergen Reactions  . Penicillins Anaphylaxis  . Sulfonamide Derivatives Anaphylaxis    FAMILY HISTORY: Family History  Problem Relation Age of Onset  . Arthritis  Mother   . Colon cancer Mother        ? age of dx  . Arthritis Father   . Kidney disease Father   . Kidney cancer Father   . Heart attack Brother   . Bladder Cancer Sister   . Hyperlipidemia Other   . Kidney cancer Paternal Aunt   . Anxiety disorder Neg Hx   . Bipolar disorder Neg Hx   . Depression Neg Hx   . Breast cancer Neg Hx   . Esophageal cancer Neg Hx   . Stomach cancer Neg Hx   . Rectal cancer Neg Hx     SOCIAL HISTORY: Social History   Socioeconomic History  . Marital status: Divorced    Spouse name: Not on file  . Number of children: 1  . Years of education: Not on file  . Highest education level: Not on file  Occupational History  . Occupation: diabled  Social Needs  . Financial resource strain: Not on file  . Food insecurity    Worry: Not on file    Inability: Not on file  . Transportation needs    Medical: Not on file    Non-medical: Not on file  Tobacco Use  . Smoking status: Current Every Day Smoker    Packs/day: 1.50    Years: 40.00    Pack years: 60.00    Types: Cigarettes  . Smokeless tobacco: Never Used  . Tobacco comment: tobacco info given 04/18/16  Substance and Sexual Activity  . Alcohol use: Yes    Alcohol/week: 0.0 standard drinks    Comment: occasionally- once every 2 weeks 1-2 beers  . Drug use: No  . Sexual activity: Never    Birth control/protection: Surgical, Abstinence  Lifestyle  . Physical activity    Days per week: Not on file    Minutes per session: Not on file  . Stress:  Not on file  Relationships  . Social Herbalist on phone: Not on file    Gets together: Not on file    Attends religious service: Not on file    Active member of club or organization: Not on file    Attends meetings of clubs or organizations: Not on file    Relationship status: Not on file  . Intimate partner violence    Fear of current or ex partner: Not on file    Emotionally abused: Not on file    Physically abused: Not on file    Forced sexual activity: Not on file  Other Topics Concern  . Not on file  Social History Narrative   Divorced, disabled   Lives with parents   Right handed     REVIEW OF SYSTEMS: Constitutional: No fevers, chills, or sweats, no generalized fatigue, change in appetite Eyes: No visual changes, double vision, eye pain Ear, nose and throat: No hearing loss, ear pain, nasal congestion, sore throat Cardiovascular: No chest pain, palpitations Respiratory:  No shortness of breath at rest or with exertion, wheezes GastrointestinaI: No nausea, vomiting, diarrhea, abdominal pain, fecal incontinence Genitourinary:  No dysuria, urinary retention or frequency Musculoskeletal:  + neck pain, back pain Integumentary: No rash, pruritus, skin lesions Neurological: as above Psychiatric: + depression, insomnia, anxiety Endocrine: No palpitations, fatigue, diaphoresis, mood swings, change in appetite, change in weight, increased thirst Hematologic/Lymphatic:  No anemia, purpura, petechiae. Allergic/Immunologic: no itchy/runny eyes, nasal congestion, recent allergic reactions, rashes  PHYSICAL EXAM: Vitals:   12/18/18 1047  BP: 118/69  Pulse:  70  Temp: 97.9 F (36.6 C)  SpO2: 97%   General: No acute distress Head:  Normocephalic/atraumatic Neck: supple, no paraspinal tenderness, full range of motion Back: No paraspinal tenderness Heart: regular rate and rhythm Lungs: Clear to auscultation bilaterally. Vascular: No carotid bruits.  Skin/Extremities: No rash, no edema Neurological Exam: Mental status: alert and oriented to person, place, and time, no dysarthria or aphasia, Fund of knowledge is reduced.  Recent and remote memory are impaired.  Attention and concentration are normal.    Able to name objects, difficulty with repetition.  Montreal Cognitive Assessment  12/18/2018  Visuospatial/ Executive (0/5) 1  Naming (0/3) 3  Attention: Read list of digits (0/2) 1  Attention: Read list of letters (0/1) 1  Attention: Serial 7 subtraction starting at 100 (0/3) 2  Language: Repeat phrase (0/2) 0  Language : Fluency (0/1) 0  Abstraction (0/2) 0  Delayed Recall (0/5) 0  Orientation (0/6) 5  Total 13  Adjusted Score (based on education) 14   Cranial nerves: CN I: not tested CN II: pupils equal, round and reactive to light, visual fields intact CN III, IV, VI:  full range of motion, no nystagmus, no ptosis CN V: facial sensation intact CN VII: upper and lower face symmetric CN VIII: hearing intact to conversation CN IX, X: gag intact, uvula midline CN XI: sternocleidomastoid and trapezius muscles intact CN XII: tongue midline Bulk & Tone: normal, no fasciculations. Motor: 5/5 throughout with no pronator drift. Sensation: intact to light touch, cold, pin throughout. Romberg test negative Deep Tendon Reflexes: unable to elicit reflexes throughout, no ankle clonus Plantar responses: downgoing bilaterally Cerebellar: no incoordination on finger to nose, heel to shin. No dysdiadochokinesia Gait: narrow-based and steady, difficulty with tandem walk Tremor: none  IMPRESSION: This is a pleasant 63 year old right-handed woman with a history of type I DM, hyperlipidemia, fibromyalgia, migraines, major depressive disorder, GAD, rule out bipolar disorder, presenting for evaluation of memory loss. She reports memory changes started after a diabetic coma at age 57. She has difficulties with complex tasks and has a guardian to  manage her finances. Her MOCA score today is 14/30. Symptoms suggestive of mild dementia, likely vascular. MRI brain without contrast will be ordered to assess for underlying structural abnormality and assess vascular load. She would like to start Donepezil, we discussed side effects and expectations from medication.We discussed the importance of control of vascular risk factors, physical exercise, and brain stimulation exercises. Continue to monitor driving. Follow-up in 6 months, she knows to call for any changes.   Thank you for allowing me to participate in the care of this patient. Please do not hesitate to call for any questions or concerns.   Ellouise Newer, M.D.  CC: Dr. Dwyane Dee, Dr. Sharlet Salina

## 2018-12-18 NOTE — Patient Instructions (Signed)
1. Schedule MRI brain without contrast  2. Start Donepezil 10mg : Take 1/2 tablet daily for 2 weeks, then increase to 1 tablet daily  3. Follow-up in 6 months, call for any changes   RECOMMENDATIONS FOR ALL PATIENTS WITH MEMORY PROBLEMS: 1. Continue to exercise (Recommend 30 minutes of walking everyday, or 3 hours every week) 2. Increase social interactions - continue going to [place] and enjoy social gatherings with friends and family 3. Eat healthy, avoid fried foods and eat more fruits and vegetables 4. Maintain adequate blood pressure, blood sugar, and blood cholesterol level. Reducing the risk of stroke and cardiovascular disease also helps promoting better memory. 5. Avoid stressful situations. Live a simple life and avoid aggravations. Organize your time and prepare for the next day in anticipation. 6. Sleep well, avoid any interruptions of sleep and avoid any distractions in the bedroom that may interfere with adequate sleep quality 7. Avoid sugar, avoid sweets as there is a strong link between excessive sugar intake, diabetes, and cognitive impairment We discussed the Mediterranean diet, which has been shown to help patients reduce the risk of progressive memory disorders and reduces cardiovascular risk. This includes eating fish, eat fruits and green leafy vegetables, nuts like almonds and hazelnuts, walnuts, and also use olive oil. Avoid fast foods and fried foods as much as possible. Avoid sweets and sugar as sugar use has been linked to worsening of memory function.

## 2018-12-20 ENCOUNTER — Other Ambulatory Visit: Payer: Self-pay | Admitting: Endocrinology

## 2018-12-20 ENCOUNTER — Ambulatory Visit: Payer: Self-pay | Admitting: Neurology

## 2019-01-11 ENCOUNTER — Ambulatory Visit
Admission: RE | Admit: 2019-01-11 | Discharge: 2019-01-11 | Disposition: A | Discharge status: Home / Self Care | Payer: Medicare HMO | Source: Ambulatory Visit | Attending: Neurology | Admitting: Neurology

## 2019-01-11 ENCOUNTER — Other Ambulatory Visit: Payer: Self-pay

## 2019-01-11 DIAGNOSIS — G3184 Mild cognitive impairment, so stated: Secondary | ICD-10-CM

## 2019-01-13 ENCOUNTER — Telehealth: Payer: Self-pay

## 2019-01-13 NOTE — Telephone Encounter (Signed)
-----   Message from Cameron Sprang, MD sent at 01/13/2019  8:40 AM EDT ----- Pls let patient know that the MRI brain looks fine, no evidence of tumor, stroke, or bleed. There are mild changes of hardening of the tiny blood vessels that are seen in patients with diabetes, cholesterol and blood pressure issues, very important to continue to control of BP, cholesterol, and sugar levels. Thanks

## 2019-01-13 NOTE — Telephone Encounter (Signed)
Pt informed of normal MRI results. She was made aware to monitor her BP,cholesterol, and sugar. Pt wanted to confirm that she should continue Donepezil. Yes. She should be taking 10mg  qd. Pt states that is what she is doing.

## 2019-01-16 DIAGNOSIS — R404 Transient alteration of awareness: Secondary | ICD-10-CM | POA: Diagnosis not present

## 2019-01-16 DIAGNOSIS — E162 Hypoglycemia, unspecified: Secondary | ICD-10-CM | POA: Diagnosis not present

## 2019-01-16 DIAGNOSIS — E161 Other hypoglycemia: Secondary | ICD-10-CM | POA: Diagnosis not present

## 2019-01-16 DIAGNOSIS — I10 Essential (primary) hypertension: Secondary | ICD-10-CM | POA: Diagnosis not present

## 2019-01-16 DIAGNOSIS — R4020 Unspecified coma: Secondary | ICD-10-CM | POA: Diagnosis not present

## 2019-01-16 LAB — MICROALBUMIN, URINE: Microalb, Ur: <7

## 2019-01-20 ENCOUNTER — Other Ambulatory Visit: Payer: Self-pay

## 2019-01-20 ENCOUNTER — Other Ambulatory Visit (INDEPENDENT_AMBULATORY_CARE_PROVIDER_SITE_OTHER): Payer: Medicare HMO

## 2019-01-20 DIAGNOSIS — E1065 Type 1 diabetes mellitus with hyperglycemia: Secondary | ICD-10-CM | POA: Diagnosis not present

## 2019-01-20 LAB — COMPREHENSIVE METABOLIC PANEL
ALT: 18 U/L (ref 0–35)
AST: 23 U/L (ref 0–37)
Albumin: 4.2 g/dL (ref 3.5–5.2)
Alkaline Phosphatase: 49 U/L (ref 39–117)
BUN: 32 mg/dL — ABNORMAL HIGH (ref 6–23)
CO2: 32 mEq/L (ref 19–32)
Calcium: 9.2 mg/dL (ref 8.4–10.5)
Chloride: 99 mEq/L (ref 96–112)
Creatinine, Ser: 0.8 mg/dL (ref 0.40–1.20)
GFR: 72.04 mL/min (ref >60.00)
Glucose, Bld: 210 mg/dL — ABNORMAL HIGH (ref 70–99)
Potassium: 3.7 mEq/L (ref 3.5–5.1)
Sodium: 137 mEq/L (ref 135–145)
Total Bilirubin: 0.3 mg/dL (ref 0.2–1.2)
Total Protein: 6.6 g/dL (ref 6.0–8.3)

## 2019-01-20 LAB — MICROALBUMIN / CREATININE URINE RATIO
Creatinine,U: 112.6 mg/dL
Microalb Creat Ratio: 242.8 mg/g — ABNORMAL HIGH (ref 0.0–30.0)
Microalb, Ur: 273.4 mg/dL — ABNORMAL HIGH (ref 0.0–1.9)

## 2019-01-20 LAB — HEMOGLOBIN A1C: Hgb A1c MFr Bld: 6.8 % — ABNORMAL HIGH (ref 4.6–6.5)

## 2019-01-23 ENCOUNTER — Other Ambulatory Visit: Payer: Self-pay

## 2019-01-23 ENCOUNTER — Encounter: Payer: Self-pay | Admitting: Endocrinology

## 2019-01-23 ENCOUNTER — Ambulatory Visit: Payer: Medicare HMO | Admitting: Endocrinology

## 2019-01-23 ENCOUNTER — Ambulatory Visit (INDEPENDENT_AMBULATORY_CARE_PROVIDER_SITE_OTHER): Payer: Self-pay | Admitting: Psychiatry

## 2019-01-23 VITALS — BP 130/76 | HR 74 | Ht 63.5 in | Wt 109.6 lb

## 2019-01-23 DIAGNOSIS — E1065 Type 1 diabetes mellitus with hyperglycemia: Secondary | ICD-10-CM

## 2019-01-23 DIAGNOSIS — Z5329 Procedure and treatment not carried out because of patient's decision for other reasons: Secondary | ICD-10-CM

## 2019-01-23 MED ORDER — GVOKE HYPOPEN 2-PACK 1 MG/0.2ML ~~LOC~~ SOAJ: 1.0000 mg | SUBCUTANEOUS | 1 refills | Status: DC | PRN

## 2019-01-23 NOTE — Progress Notes (Deleted)
Virtual Visit via Telephone Note  I connected with Leslie Lane on 01/23/19 at  1:30 PM EDT by telephone and verified that I am speaking with the correct person using two identifiers.  Location: Patient: *** Provider: ***   I discussed the limitations, risks, security and privacy concerns of performing an evaluation and management service by telephone and the availability of in person appointments. I also discussed with the patient that there may be a patient responsible charge related to this service. The patient expressed understanding and agreed to proceed.   History of Present Illness:    Observations/Objective: I spoke with Leslie Lane on the phone.  Pt was calm, pleasant and cooperative.  Pt was engaged in the conversation and answered questions appropriately.  Speech was clear and coherent with normal rate, tone and volume.  Mood is ***depressed and anxious, affect is congruent. Thought processes are coherent, goal oriented and intact.  Thought content is with ruminations***.  Pt denies SI/HI.   Pt denies auditory and visual hallucinations and did not appear to be responding to internal stimuli.  Memory and concentration are good.  Fund of knowledge and use of language are average.  Insight and judgment are fair.  I am unable to comment on psychomotor activity, general appearance, hygiene, or eye contact as I was unable to physically see the patient on the phone.  Vital signs not available since interview conducted virtually.    Assessment and Plan:   Follow Up Instructions:    I discussed the assessment and treatment plan with the patient. The patient was provided an opportunity to ask questions and all were answered. The patient agreed with the plan and demonstrated an understanding of the instructions.   The patient was advised to call back or seek an in-person evaluation if the symptoms worsen or if the condition fails to improve as anticipated.  I provided *** minutes of  non-face-to-face time during this encounter.   Charlcie Cradle, MD

## 2019-01-23 NOTE — Progress Notes (Signed)
Patient ID: Leslie Lane, female   DOB: 1955-10-28, 63 y.o.   MRN: [id number]  Reason for Appointment: Endocrinology follow-up   History of Present Illness    Today's office visit was provided via telemedicine using an audio phone call Explained to the patient and the the limitations of evaluation and management by telemedicine and the availability of in person appointments.  The patient understood the limitations and agreed to proceed. Patient also understood that the telehealth visit is billable. . Location of the patient: Home . Location of the provider: Office Only the patient and myself were participating in the encounter    Diagnosis: Type 1 DIABETES MELITUS, diagnosed 1967      She has had labile blood sugar control over the last several years even though A1c has been usually around 7% She has had less lability and hypoglycemia with taking b.i.d. Lantus compared once a day She has been very sensitive to fast acting insulin and frequently does not require mealtime coverage She cannot tolerate Toujeo because of reported episode of headache, bloating and nausea with the first dose  RECENT history:  Insulin regimen: Tresiba 8 units daily, Novolog 1-2 units up to 4 times a day   Her A1c has been generally close to 7% and now 6.9  Current blood sugar patterns and problems identified:  Her blood sugars are very labile now and difficult to control  She now appears to have significant hyperglycemia on waking up  She thinks this is from taking consistently a lot of snacks during the night including high-fat foods like potato chips.  She said that she will eat at night because she is bored and does not like to make better choices and what she is eating  She will try to sometimes take 2 to 3 units of NovoLog before she starts snacking but not consistently  However her blood sugars are still averaging over 200 on waking up  She is also having hypoglycemia almost  every other day now usually precipitated by increased physical activity  Despite repeated instructions she does not check her sugar before getting active and take snacks while she is being more active which is mostly housecleaning   HYPOGLYCEMIA: Lowest blood sugar 27  May not have symptoms with low blood sugars and does not have early warning symptoms. Gets confused frequently and she depends on her mother to recognize low sugars and treat them.  Her mother knows how to give Glucagon injection Currently she does not have a refill and since on the last injection apparently the needle broke off the patient is not sure if her mother can do it again She again called EMS recently  She will treat her low blood sugars usually with juice, does carry glucose tablets with her      DIET: She is trying to get protein with eggs at breakfast Meal times: Variable.Marland Kitchen  She will eat more when she is anxious  Monitors blood glucose:  about 4-5 times a day.    Glucometer:Accu-Chek Aviva   PRE-MEAL Fasting Lunch Dinner Bedtime Overall  Glucose range:  85-349   36-269  33-205  33-349  Mean/median:  178  118  130  103  131   PREVIOUS readings:  PRE-MEAL Fasting Lunch Dinner Bedtime Overall  Glucose range:  50-299    43-321  23-354  Mean/median:  174  140  115  133  137+/-70   POST-MEAL PC Breakfast PC Lunch PC Dinner  Glucose range:  Mean/median:   121        Wt Readings from Last 3 Encounters:  01/23/19 109 lb 9.6 oz (49.7 kg)  12/18/18 108 lb 3.2 oz (49.1 kg)  12/05/18 107 lb (48.5 kg)    Lab Results  Component Value Date   HGBA1C 6.8 (H) 01/20/2019   HGBA1C 6.9 (H) 10/22/2018   HGBA1C 7.3 (H) 06/13/2018   Lab Results  Component Value Date   MICROALBUR 273.4 (H) 01/20/2019   LDLCALC 79 06/13/2018   CREATININE 0.80 01/20/2019        Allergies as of 01/23/2019      Reactions   Penicillins Anaphylaxis   Sulfonamide Derivatives Anaphylaxis      Medication List        Accurate as of January 23, 2019 10:45 AM. If you have any questions, ask your nurse or doctor.        Accu-Chek Aviva Plus w/Device Kit USE AS DIRECTED   Accu-Chek Aviva Soln USE AS DIRECTED   Accu-Chek Softclix Lancets lancets Use as instructed to check blood sugar 4 times per day dx code E10.65 What changed: additional instructions   aspirin EC 81 MG tablet Take 81 mg by mouth every morning.   B-D SINGLE USE SWABS REGULAR Pads USE FOUR TIMES DAILY   diclofenac sodium 1 % Gel Commonly known as: VOLTAREN APPLY 2 GRAMS EXTERNALLY TO THE AFFECTED AREA FOUR TIMES DAILY   donepezil 10 MG tablet Commonly known as: ARICEPT Take 1/2 tablet daily for 2 weeks, then increase to 1 tablet daily and continue   Fish Oil 1000 MG Caps Take 1 capsule by mouth daily.   gabapentin 100 MG capsule Commonly known as: NEURONTIN TAKE 1 TO 2 CAPSULES BY MOUTH TWICE DAILY   glucagon 1 MG Solr injection Commonly known as: GlucaGen HypoKit Inject 1 mg into the skin once as needed for up to 1 dose for low blood sugar.   glucose blood test strip Commonly known as: Accu-Chek Aviva Plus TEST BLOOD SUGAR FOUR TIMES DAILY   insulin aspart 100 UNIT/ML FlexPen Commonly known as: NovoLOG FlexPen INJECT 1-3 UNITS UNDER THE SKIN UP TO 4 TIMES DAILY.   Insulin Pen Needle 31G X 8 MM Misc Commonly known as: B-D ULTRAFINE III SHORT PEN USE TO INJECT INSULIN 4 TIMES DAILY.   lamoTRIgine 100 MG tablet Commonly known as: LAMICTAL Take 1 tablet (100 mg total) by mouth 2 (two) times daily.   lidocaine 5 % Commonly known as: LIDODERM Place 1 patch onto the skin daily. Remove & Discard patch within 12 hours or as directed by MD   lipase/protease/amylase 36000 UNITS Cpep capsule Commonly known as: Creon Take 2 capsules (72,000 Units total) by mouth 3 (three) times daily before meals.   loratadine 10 MG tablet Commonly known as: CLARITIN Take 10 mg by mouth as needed for allergies.   lovastatin 40 MG  tablet Commonly known as: MEVACOR Take 1 tablet (40 mg total) by mouth at bedtime.   Magnesium 500 MG Tabs Take 1 tablet by mouth as needed.   metoCLOPramide 5 MG tablet Commonly known as: REGLAN TAKE 1 TABLET THREE TIMES DAILY as needed BEFORE MEALS   multivitamin tablet Take 1 tablet by mouth daily. Reported on 11/22/2015   omeprazole 40 MG capsule Commonly known as: PRILOSEC TAKE 1 CAPSULE(40 MG) BY MOUTH DAILY   traMADol 50 MG tablet Commonly known as: ULTRAM Take 1 tablet (50 mg total) by mouth every other day.   trazodone 300 MG  tablet Commonly known as: DESYREL Take 1 tablet (300 mg total) by mouth at bedtime.   Tyler Aas FlexTouch 100 UNIT/ML Sopn FlexTouch Pen Generic drug: insulin degludec INJECT 15 UNITS INTO THE SKIN DAILY.   vitamin B-12 1000 MCG tablet Commonly known as: CYANOCOBALAMIN Take 1 tablet by mouth daily.   vitamin E 1000 UNIT capsule Generic drug: vitamin E Take 1,000 Units by mouth daily.       Allergies:  Allergies  Allergen Reactions  . Penicillins Anaphylaxis  . Sulfonamide Derivatives Anaphylaxis    Past Medical History:  Diagnosis Date  . ALLERGIC RHINITIS   . Allergy   . Anemia   . Anxiety   . Arthritis   . BIPOLAR AFFECTIVE DISORDER   . Cataract   . DEPRESSION   . Diabetes mellitus type I ([place])   . DIABETES MELLITUS, TYPE II    follows with endo  . Fibromyalgia   . GERD   . HEPATITIS B   . HYPERLIPIDEMIA   . LOW BACK PAIN, CHRONIC   . MIGRAINE HEADACHE   . NARCOTIC ABUSE    hx of  . NECK PAIN, CHRONIC   . Neuropathy   . Seizures ([place])    pt states, "if my blood sugar drops to the 20's, I convulse.  It hasn't happened in a long time."  . SMOKER     Past Surgical History:  Procedure Laterality Date  . CESAREAN SECTION    . COLONOSCOPY    . TUBAL LIGATION    . UPPER GASTROINTESTINAL ENDOSCOPY      Family History  Problem Relation Age of Onset  . Arthritis Mother   . Colon cancer Mother        ? age of  dx  . Arthritis Father   . Kidney disease Father   . Kidney cancer Father   . Heart attack Brother   . Bladder Cancer Sister   . Hyperlipidemia Other   . Kidney cancer Paternal Aunt   . Anxiety disorder Neg Hx   . Bipolar disorder Neg Hx   . Depression Neg Hx   . Breast cancer Neg Hx   . Esophageal cancer Neg Hx   . Stomach cancer Neg Hx   . Rectal cancer Neg Hx     Social History:  reports that she has been smoking cigarettes. She has a 60.00 pack-year smoking history. She has never used smokeless tobacco. She reports current alcohol use. She reports that she does not use drugs.  Review of Systems:   Has probable gastroparesis treated with Reglan as needed  NEUROPATHY: She has burning in her legs and feet at times treated with gabapentin   DEPRESSION: She has had long-standing depression and anxiety with variable level of control and is on Lamictal  HYPERLIPIDEMIA: The lipid abnormality consists of elevated LDL   Is  on lovastatin 40 mg with  good control   Lab Results  Component Value Date   CHOL 173 06/13/2018   HDL 79.20 06/13/2018   LDLCALC 79 06/13/2018   LDLDIRECT 101.7 03/26/2012   TRIG 73.0 06/13/2018   CHOLHDL 2 06/13/2018    Memory difficulties: She is on Aricept by the neurologist now   Examination:   BP 130/76 (BP Location: Left Arm, Patient Position: Sitting, Cuff Size: Normal)   Pulse 74   Ht 5' 3.5" (1.613 m)   Wt 109 lb 9.6 oz (49.7 kg)   SpO2 98%   BMI 19.11 kg/m   Body mass index is  19.11 kg/m.    ASSESSMENT/ PLAN:   Diabetes type 1 with poor control  See history of present illness for detailed discussion of current insulin, blood sugar patterns, problems identified  A1c has been under 7%  However her A1c may be averaging about her high and low blood sugars and having both extremes regularly now Fasting readings are high most likely from eating high fat snacks late at night However because of her tendency to low sugars during the  day will not be able to increase her basal insulin She is also not comfortable using the Dexcom sensor or going further to use an insulin pump which will be otherwise ideal for her  Low blood sugars are always related to increased physical activity with housecleaning that she is doing fairly regularly She is not checking her blood sugars before the activity starts and not taking snacks or juice to prevent hypoglycemia Also may be sometimes arbitrarily taking NovoLog when she is eating but not clear if this is causing hypoglycemia Late night snacks are sometimes being covered by her NovoLog but she is still having consistently high readings in the mornings  She may benefit from nutritional counseling to help her manage her meals and snacks better  She agrees to try and check her sugar right before she starts being active with her housecleaning chores and stop every few minutes to take a snack or juice Recommended trying to use more low fat snacks but she did not like this Also may consider using freestyle libre on the next visit  Follow-up in 2 months Consultation will be made for the dietitian  Counseling time on subjects discussed in assessment and plan sections is over 50% of today's 25 minute visit    There are no Patient Instructions on file for this visit.     [name] 01/23/2019, 10:45 AM

## 2019-01-23 NOTE — Patient Instructions (Signed)
Take a snack or juice before housework  Low fat snacks at [employer]

## 2019-01-23 NOTE — Progress Notes (Signed)
I called pt twice and there was no answer. There was no return call. I was unable to speak with the patient today. Pt is a no show.

## 2019-01-24 ENCOUNTER — Other Ambulatory Visit: Payer: Self-pay | Admitting: Endocrinology

## 2019-01-28 ENCOUNTER — Other Ambulatory Visit (HOSPITAL_COMMUNITY): Payer: Self-pay | Admitting: Psychiatry

## 2019-01-28 DIAGNOSIS — G4701 Insomnia due to medical condition: Secondary | ICD-10-CM

## 2019-01-28 DIAGNOSIS — F332 Major depressive disorder, recurrent severe without psychotic features: Secondary | ICD-10-CM

## 2019-01-28 DIAGNOSIS — F4010 Social phobia, unspecified: Secondary | ICD-10-CM

## 2019-01-28 LAB — MICROALBUMIN, URINE: Microalb, Ur: <7

## 2019-01-29 ENCOUNTER — Other Ambulatory Visit: Payer: Self-pay | Admitting: Internal Medicine

## 2019-02-04 ENCOUNTER — Other Ambulatory Visit: Payer: Self-pay | Admitting: Internal Medicine

## 2019-02-15 ENCOUNTER — Other Ambulatory Visit: Payer: Self-pay | Admitting: Internal Medicine

## 2019-02-17 NOTE — Progress Notes (Signed)
[facility] [address], [place] [address] Phone: [phone] Subjective:   [name], am serving as a scribe for Dr. Hulan Saas. I'm seeing this patient by the request  of:     CC:    Left hand pain  NMM:[id number]  Leslie Lane is a 63 y.o. female coming in with complaint of left hand pain. Patient states that she is unable to fully make a fist with entire first. Pain in 4th finger. Triggering for 6 months.  Pain is starting to affect daily activities.  Rates the severity of pain is 8 out of 10       Past Medical History:  Diagnosis Date  . ALLERGIC RHINITIS   . Allergy   . Anemia   . Anxiety   . Arthritis   . BIPOLAR AFFECTIVE DISORDER   . Cataract   . DEPRESSION   . Diabetes mellitus type I ([place])   . DIABETES MELLITUS, TYPE II    follows with endo  . Fibromyalgia   . GERD   . HEPATITIS B   . HYPERLIPIDEMIA   . LOW BACK PAIN, CHRONIC   . MIGRAINE HEADACHE   . NARCOTIC ABUSE    hx of  . NECK PAIN, CHRONIC   . Neuropathy   . Seizures ([place])    pt states, "if my blood sugar drops to the 20's, I convulse.  It hasn't happened in a long time."  . SMOKER    Past Surgical History:  Procedure Laterality Date  . CESAREAN SECTION    . COLONOSCOPY    . TUBAL LIGATION    . UPPER GASTROINTESTINAL ENDOSCOPY     Social History   Socioeconomic History  . Marital status: Divorced    Spouse name: Not on file  . Number of children: 1  . Years of education: Not on file  . Highest education level: Not on file  Occupational History  . Occupation: diabled  Social Needs  . Financial resource strain: Not on file  . Food insecurity    Worry: Not on file    Inability: Not on file  . Transportation needs    Medical: Not on file    Non-medical: Not on file  Tobacco Use  . Smoking status: Current Every Day Smoker    Packs/day: 1.50    Years: 40.00    Pack years: 60.00    Types: Cigarettes  . Smokeless tobacco: Never Used   . Tobacco comment: tobacco info given 04/18/16  Substance and Sexual Activity  . Alcohol use: Yes    Alcohol/week: 0.0 standard drinks    Comment: occasionally- once every 2 weeks 1-2 beers  . Drug use: No  . Sexual activity: Never    Birth control/protection: Surgical, Abstinence  Lifestyle  . Physical activity    Days per week: Not on file    Minutes per session: Not on file  . Stress: Not on file  Relationships  . Social Herbalist on phone: Not on file    Gets together: Not on file    Attends religious service: Not on file    Active member of club or organization: Not on file    Attends meetings of clubs or organizations: Not on file    Relationship status: Not on file  Other Topics Concern  . Not on file  Social History Narrative   Divorced, disabled   Lives with parents   Right handed  Allergies  Allergen Reactions  . Penicillins Anaphylaxis  . Sulfonamide Derivatives Anaphylaxis   Family History  Problem Relation Age of Onset  . Arthritis Mother   . Colon cancer Mother        ? age of dx  . Arthritis Father   . Kidney disease Father   . Kidney cancer Father   . Heart attack Brother   . Bladder Cancer Sister   . Hyperlipidemia Other   . Kidney cancer Paternal Aunt   . Anxiety disorder Neg Hx   . Bipolar disorder Neg Hx   . Depression Neg Hx   . Breast cancer Neg Hx   . Esophageal cancer Neg Hx   . Stomach cancer Neg Hx   . Rectal cancer Neg Hx     Current Outpatient Medications (Endocrine & Metabolic):  .  glucagon (GLUCAGEN HYPOKIT) 1 MG SOLR injection, Inject 1 mg into the skin once as needed for up to 1 dose for low blood sugar. .  Glucagon (GVOKE HYPOPEN 2-PACK) 1 MG/0.2ML SOAJ, Inject 1 mg into the skin as needed. For Hypo .  insulin aspart (NOVOLOG FLEXPEN) 100 UNIT/ML FlexPen, INJECT 1-3 UNITS UNDER THE SKIN UP TO 4 TIMES DAILY. Marland Kitchen  TRESIBA FLEXTOUCH 100 UNIT/ML SOPN FlexTouch Pen, INJECT 15 UNITS INTO THE SKIN DAILY.  Current  Outpatient Medications (Cardiovascular):  .  lovastatin (MEVACOR) 40 MG tablet, Take 1 tablet (40 mg total) by mouth at bedtime.  Current Outpatient Medications (Respiratory):  .  loratadine (CLARITIN) 10 MG tablet, Take 10 mg by mouth as needed for allergies.  Current Outpatient Medications (Analgesics):  .  aspirin EC 81 MG tablet, Take 81 mg by mouth every morning. .  traMADol (ULTRAM) 50 MG tablet, Take 1 tablet (50 mg total) by mouth every other day.  Current Outpatient Medications (Hematological):  .  vitamin B-12 (CYANOCOBALAMIN) 1000 MCG tablet, Take 1 tablet by mouth daily.  Current Outpatient Medications (Other):  Marland Kitchen  ACCU-CHEK SOFTCLIX LANCETS lancets, Use as instructed to check blood sugar 4 times per day dx code E10.65 (Patient taking differently: Use as instructed to check blood sugar 8 times per day dx code E10.65) .  Alcohol Swabs (B-D SINGLE USE SWABS REGULAR) PADS, USE FOUR TIMES DAILY .  Blood Glucose Calibration (ACCU-CHEK AVIVA) SOLN, USE AS DIRECTED .  Blood Glucose Monitoring Suppl (ACCU-CHEK AVIVA PLUS) w/Device KIT, USE AS DIRECTED .  diclofenac sodium (VOLTAREN) 1 % GEL, APPLY 2 GRAMS EXTERNALLY TO THE AFFECTED AREA FOUR TIMES DAILY .  donepezil (ARICEPT) 10 MG tablet, Take 1/2 tablet daily for 2 weeks, then increase to 1 tablet daily and continue .  gabapentin (NEURONTIN) 100 MG capsule, TAKE 1 TO 2 CAPSULES(100 TO 200 MG) BY MOUTH TWICE DAILY .  glucose blood (ACCU-CHEK AVIVA PLUS) test strip, TEST BLOOD SUGAR FOUR TIMES DAILY .  Insulin Pen Needle (B-D ULTRAFINE III SHORT PEN) 31G X 8 MM MISC, USE 1 PEN NEEDLE TO INJECT FOUR TIMES DAILY .  lamoTRIgine (LAMICTAL) 100 MG tablet, Take 1 tablet (100 mg total) by mouth 2 (two) times daily. Marland Kitchen  lidocaine (LIDODERM) 5 %, Place 1 patch onto the skin daily. Remove & Discard patch within 12 hours or as directed by MD .  lipase/protease/amylase (CREON) 36000 UNITS CPEP capsule, Take 2 capsules (72,000 Units total) by mouth 3  (three) times daily before meals. .  Magnesium 500 MG TABS, Take 1 tablet by mouth as needed. .  metoCLOPramide (REGLAN) 5 MG tablet, TAKE 1 TABLET  THREE TIMES DAILY as needed BEFORE MEALS .  Multiple Vitamin (MULTIVITAMIN) tablet, Take 1 tablet by mouth daily. Reported on 11/22/2015 .  Omega-3 Fatty Acids (FISH OIL) 1000 MG CAPS, Take 1 capsule by mouth daily. Marland Kitchen  omeprazole (PRILOSEC) 40 MG capsule, TAKE 1 CAPSULE(40 MG) BY MOUTH DAILY .  trazodone (DESYREL) 300 MG tablet, Take 1 tablet (300 mg total) by mouth at bedtime. .  vitamin E (VITAMIN E) 1000 UNIT capsule, Take 1,000 Units by mouth daily.      Past medical history, social, surgical and family history all reviewed in electronic medical record.  No pertanent information unless stated regarding to the chief complaint.   Review of Systems:  No headache, visual changes, nausea, vomiting, diarrhea, constipation, dizziness, abdominal pain, skin rash, fevers, chills, night sweats, weight loss, swollen lymph nodes, body aches, joint swelling, muscle aches, chest pain, shortness of breath, mood changes.   Objective  There were no vitals taken for this visit. Systems examined below as of    General: No apparent distress alert and oriented x3 mood and affect normal, dressed appropriately.  HEENT: Pupils equal, extraocular movements intact  Respiratory: Patient's speak in full sentences and does not appear short of breath  Cardiovascular: No lower extremity edema, non tender, no erythema  Skin: Warm dry intact with no signs of infection or rash on extremities or on axial skeleton.  Abdomen: Soft nontender  Neuro: Cranial nerves II through XII are intact, neurovascularly intact in all extremities with 2+ DTRs and 2+ pulses.  Lymph: No lymphadenopathy of posterior or anterior cervical chain or axillae bilaterally.  Gait normal with good balance and coordination.  MSK: Left hand exam shows the patient does have an trigger nodule at the A2  pulley on the left ring finger that is severely tender to palpation.  Triggering noted with passive range of motion.   Tender with full range of motion and good stability and symmetric strength and tone of shoulders, elbows, wrist, hip, knee and ankles bilaterally.  Mild arthritic changes of multiple joints   Procedure: Real-time Ultrasound Guided Injection of left ring finger flexor tendon sheath Device: GE Logiq Q7 Ultrasound guided injection is preferred based studies that show increased duration, increased effect, greater accuracy, decreased procedural pain, increased response rate, and decreased cost with ultrasound guided versus blind injection.  Verbal informed consent obtained.  Time-out conducted.  Noted no overlying erythema, induration, or other signs of local infection.  Skin prepped in a sterile fashion.  Local anesthesia: Topical Ethyl chloride.  With sterile technique and under real time ultrasound guidance: With a 25-gauge half inch needle injected with 0.5 cc of 0.5% Marcaine and 0.5 cc of Kenalog 40 mg/mL Completed without difficulty  Pain immediately resolved suggesting accurate placement of the medication.  Advised to call if fevers/chills, erythema, induration, drainage, or persistent bleeding.  Images permanently stored and available for review in the ultrasound unit.  Impression: Technically successful ultrasound guided injection.   Impression and Recommendations:     This case required medical decision making of moderate complexity. The above documentation has been reviewed and is accurate and complete Lyndal Pulley, DO       Note: This dictation was prepared with Dragon dictation along with smaller phrase technology. Any transcriptional errors that result from this process are unintentional.

## 2019-02-18 ENCOUNTER — Other Ambulatory Visit: Payer: Self-pay

## 2019-02-18 ENCOUNTER — Ambulatory Visit: Payer: Medicare HMO | Admitting: Family Medicine

## 2019-02-18 ENCOUNTER — Encounter: Payer: Self-pay | Admitting: Family Medicine

## 2019-02-18 ENCOUNTER — Ambulatory Visit: Payer: Self-pay

## 2019-02-18 VITALS — BP 124/74 | HR 71 | Ht 63.5 in | Wt 113.0 lb

## 2019-02-18 DIAGNOSIS — M65342 Trigger finger, left ring finger: Secondary | ICD-10-CM | POA: Diagnosis not present

## 2019-02-18 DIAGNOSIS — M79642 Pain in left hand: Secondary | ICD-10-CM

## 2019-02-18 HISTORY — DX: Trigger finger, left ring finger: M65.342

## 2019-02-18 NOTE — Patient Instructions (Signed)
Wear brace at night  Injected finger today See me again in 6 weeks

## 2019-02-18 NOTE — Assessment & Plan Note (Signed)
Patient given injection for trigger finger.  He tolerated the procedure well.  Bracing at night, topical anti-inflammatories over-the-counter medication.  Follow-up with me again in 6 to 8 weeks.  Patient to avoid repetitive activity

## 2019-02-21 ENCOUNTER — Other Ambulatory Visit: Payer: Self-pay | Admitting: Internal Medicine

## 2019-02-26 ENCOUNTER — Encounter: Payer: Self-pay | Admitting: Internal Medicine

## 2019-02-26 NOTE — Progress Notes (Signed)
Abstracted and sent to scan  

## 2019-03-02 DIAGNOSIS — E162 Hypoglycemia, unspecified: Secondary | ICD-10-CM | POA: Diagnosis not present

## 2019-03-02 DIAGNOSIS — E161 Other hypoglycemia: Secondary | ICD-10-CM | POA: Diagnosis not present

## 2019-03-02 DIAGNOSIS — R0902 Hypoxemia: Secondary | ICD-10-CM | POA: Diagnosis not present

## 2019-03-02 DIAGNOSIS — R4020 Unspecified coma: Secondary | ICD-10-CM | POA: Diagnosis not present

## 2019-03-12 ENCOUNTER — Other Ambulatory Visit: Payer: Self-pay | Admitting: Internal Medicine

## 2019-03-18 ENCOUNTER — Other Ambulatory Visit: Payer: Self-pay | Admitting: Endocrinology

## 2019-03-20 ENCOUNTER — Other Ambulatory Visit: Payer: Self-pay

## 2019-03-20 ENCOUNTER — Encounter: Payer: Self-pay | Admitting: Dietician

## 2019-03-20 ENCOUNTER — Encounter: Payer: Medicare HMO | Attending: Endocrinology | Admitting: Dietician

## 2019-03-20 DIAGNOSIS — E1065 Type 1 diabetes mellitus with hyperglycemia: Secondary | ICD-10-CM | POA: Diagnosis not present

## 2019-03-20 NOTE — Progress Notes (Signed)
Diabetes Self-Management Education  Visit Type: First/Initial  Appt. Start Time: 1545 Appt. End Time: 1700 (Did not get checked in correctly- appointment started late.)  03/20/2019  [name], identified by name and date of birth, is a 63 y.o. female with a diagnosis of Diabetes: Type 1.   ASSESSMENT History includes type 1 diabetes (since 1967), depression, fibromyalgia, anxiety, neuropathy, gastroparesis (reglan as needed), GERD, HLD, bipolar affective disorder, smokes and memory difficulties (on Aricept).    Labs noted to include Vitamin D 06/04/90 of 38.  She states she will not take a vitamin D supplement as it caused constipation.  She does get sun most days.  Medications include Novolog 1-3 units prior to meals and Tresiba 15 units every am. She states that she does not want to take more Novolog with her meal as she sticks herself enough.  Discussed that if she eats a balanced meal with more carbohydrates (a half of a sweet potato rather than the couple bites she often has) she may need to increase the insulin dose but not the number of injections.  Weight history:  111 lbs today increased from 109 lbs 01/23/19.  "Due to eating a lot of snacks" 149 lbs when I last saw her 03/25/15 and she states she lost weight due to diarrhea and malabsorption of food    Overall diet quality could improve and fatty low nutrition value snacks decrease.  Discussed options with patient, basic meal planning, carbohydrate content of foods and showed portion sizes.  Patient lives with her mother as as she states that she is afraid of living by herself.  Patient does the shopping and cooking.  Her father recently passed away and her mom's energy is now very poor and is 8 yo.  Patient is on Medicare and disability.  Height 5' 3.5" (1.613 m), weight 111 lb (50.3 kg). Body mass index is 19.35 kg/m.  Diabetes Self-Management Education - 03/20/19 1617      Visit Information   Visit Type   First/Initial      Initial Visit   Diabetes Type  Type 1    Are you currently following a meal plan?  No    Are you taking your medications as prescribed?  Yes    Date Diagnosed  [place]   How would you rate your overall health?  Fair      Psychosocial Assessment   Patient Belief/Attitude about Diabetes  Motivated to manage diabetes    Self-care barriers  None    Self-management support  Doctor's office;Family    Other persons present  Patient    Patient Concerns  Nutrition/Meal planning    Special Needs  None    Preferred Learning Style  No preference indicated    Learning Readiness  Ready    How often do you need to have someone help you when you read instructions, pamphlets, or other written materials from your doctor or pharmacy?  3 - Sometimes   memory issues, doesn't like to read   What is the last grade level you completed in school?  12th grade      Pre-Education Assessment   Patient understands the diabetes disease and treatment process.  Needs Review    Patient understands incorporating nutritional management into lifestyle.  Needs Review    Patient undertands incorporating physical activity into lifestyle.  Needs Review    Patient understands using medications safely.  Needs Review    Patient understands monitoring  blood glucose, interpreting and using results  Needs Review    Patient understands prevention, detection, and treatment of acute complications.  Needs Review    Patient understands prevention, detection, and treatment of chronic complications.  Needs Review    Patient understands how to develop strategies to address psychosocial issues.  Needs Review    Patient understands how to develop strategies to promote health/change behavior.  Needs Review      Complications   Last HgB A1C per patient/outside source  6.8 %   01/20/2019   How often do you check your blood sugar?  > 4 times/day   6-7 x per day   Fasting Blood glucose range (mg/dL)   70-129;130-179    Number of hypoglycemic episodes per month  4    Can you tell when your blood sugar is low?  Yes    What do you do if your blood sugar is low?  OJ, glucagon    Have you had a dilated eye exam in the past 12 months?  Yes    Have you had a dental exam in the past 12 months?  Yes    Are you checking your feet?  Yes    How many days per week are you checking your feet?  7      Dietary Intake   Breakfast  Adkin's shake, orange and sometimes SF PB cookies    Lunch  homemade soup, apple    Snack (afternoon)  2 bites sweet potato, clementine    Dinner  homemade soup (beef, carrots, onions, celery)    Snack (evening)  jalopeno cheetos, pork rinds, SF peanut butter cookies    Beverage(s)  diet green tea with splenda, water      Exercise   Exercise Type  Light (walking / raking leaves)   walks or rides bike   How many days per week to you exercise?  7    How many minutes per day do you exercise?  30    Total minutes per week of exercise  210      Patient Education   Previous Diabetes Education  Yes (please comment)   2016 with myself and other times with Vaughan Basta   Nutrition management   Role of diet in the treatment of diabetes and the relationship between the three main macronutrients and blood glucose level;Food label reading, portion sizes and measuring food.;Carbohydrate counting;Meal options for control of blood glucose level and chronic complications.    Medications  Reviewed patients medication for diabetes, action, purpose, timing of dose and side effects.    Acute complications  Other (comment)   Discussed her treatment of hypoglycemia   Personal strategies to promote health  Lifestyle issues that need to be addressed for better diabetes care      Individualized Goals (developed by patient)   Nutrition  Follow meal plan discussed    Physical Activity  Exercise 5-7 days per week;30 minutes per day    Medications  take my medication as prescribed    Monitoring   test  my blood glucose as discussed    Health Coping  discuss diabetes with (comment)   MD, RD, CDE     Post-Education Assessment   Patient understands the diabetes disease and treatment process.  Demonstrates understanding / competency    Patient understands incorporating nutritional management into lifestyle.  Demonstrates understanding / competency    Patient undertands incorporating physical activity into lifestyle.  Demonstrates understanding / competency    Patient understands  using medications safely.  Demonstrates understanding / competency    Patient understands monitoring blood glucose, interpreting and using results  Demonstrates understanding / competency    Patient understands prevention, detection, and treatment of acute complications.  Demonstrates understanding / competency    Patient understands prevention, detection, and treatment of chronic complications.  Demonstrates understanding / competency    Patient understands how to develop strategies to address psychosocial issues.  Demonstrates understanding / competency    Patient understands how to develop strategies to promote health/change behavior.  Demonstrates understanding / competency      Outcomes   Expected Outcomes  Other (comment)   demonstrated interest in learning but question if she will change   Program Status  Completed       Individualized Plan for Diabetes Self-Management Training:   Learning Objective:  Patient will have a greater understanding of diabetes self-management. Patient education plan is to attend individual and/or group sessions per assessed needs and concerns.   Plan:   Patient Instructions  Improve your nutrition quality with your meals.  It is fine to eat 30-45 grams of carbohydrate with each meal. If you are eating better balanced meals, you may not desire so many unhealthy snacks at night.  Choose snacks that are lower in fat if you are going to have something.  See list (sugar free jello  and sugar free popsickles are good choices).  Aim for 2-3 Carb Choices per meal (30-45 grams) +/- 1 either way  Aim for 0-1 Carbs per snack if hungry  Include protein in moderation with your meals and snacks Consider reading food labels for Total Carbohydrate of foods Continue checking BG at alternate times per day  Continue taking medication as directed by MD  Always be sure that you check your blood sugar before your drive!     Expected Outcomes:  Other (comment)(demonstrated interest in learning but question if she will change)  Education material provided: My Plate and Snack sheet  If problems or questions, patient to contact team via:  Phone and Email  Future DSME appointment:

## 2019-03-20 NOTE — Patient Instructions (Addendum)
Improve your nutrition quality with your meals.  It is fine to eat 30-45 grams of carbohydrate with each meal. If you are eating better balanced meals, you may not desire so many unhealthy snacks at night.  Choose snacks that are lower in fat if you are going to have something.  See list (sugar free jello and sugar free popsickles are good choices).  Aim for 2-3 Carb Choices per meal (30-45 grams) +/- 1 either way  Aim for 0-1 Carbs per snack if hungry  Include protein in moderation with your meals and snacks Consider reading food labels for Total Carbohydrate of foods Continue checking BG at alternate times per day  Continue taking medication as directed by MD  Always be sure that you check your blood sugar before your drive!

## 2019-03-31 ENCOUNTER — Other Ambulatory Visit: Payer: Self-pay

## 2019-04-02 ENCOUNTER — Ambulatory Visit (INDEPENDENT_AMBULATORY_CARE_PROVIDER_SITE_OTHER): Payer: Medicare HMO | Admitting: Endocrinology

## 2019-04-02 ENCOUNTER — Ambulatory Visit: Payer: Medicare HMO | Admitting: Family Medicine

## 2019-04-02 ENCOUNTER — Other Ambulatory Visit: Payer: Self-pay

## 2019-04-02 ENCOUNTER — Encounter: Payer: Self-pay | Admitting: Endocrinology

## 2019-04-02 VITALS — BP 102/58 | HR 73 | Temp 97.9°F | Wt 110.0 lb

## 2019-04-02 DIAGNOSIS — E1065 Type 1 diabetes mellitus with hyperglycemia: Secondary | ICD-10-CM

## 2019-04-02 DIAGNOSIS — E1042 Type 1 diabetes mellitus with diabetic polyneuropathy: Secondary | ICD-10-CM | POA: Diagnosis not present

## 2019-04-02 NOTE — Patient Instructions (Addendum)
Snack before housework  No Novolog unless sugar going up  [address] [place]

## 2019-04-02 NOTE — Progress Notes (Signed)
Patient ID: Leslie Lane, female   DOB: [birth year], 63 y.o.   MRN: [id number]  Reason for Appointment: Endocrinology follow-up   History of Present Illness     Diagnosis: Type 1 DIABETES MELITUS, diagnosed 1967      She has had labile blood sugar control over the last several years even though A1c has been usually around 7% She has had less lability and hypoglycemia with taking b.i.d. Lantus compared once a day She has been very sensitive to fast acting insulin and frequently does not require mealtime coverage She cannot tolerate Toujeo because of reported episode of headache, bloating and nausea with the first dose  RECENT history:  Insulin regimen: Tresiba 9 units daily, Novolog 1-2 units up to 4 times a day   Her A1c has been mostly around 7%, last 6.8  Current blood sugar patterns and problems identified:  She said that she went up on her Tresiba by 1 unit because of counteracting her increased intake  However this was not based on her blood sugars in the morning  Although in the last week she has not had any consistent hypoglycemia she still has tendency to HYPOGLYCEMIA periodically during the day especially in the afternoon and evenings  She is still confused about how to take NovoLog and will sometimes take it even when she is just eating some snacks like chips without confirming her blood glucose is going up  With this she has had occasional late night blood sugars that are low  However most of her HYPOGLYCEMIA is related to increased physical activity during the day and she has had blood sugars as low as 24 requiring glucagon injection by her mother  She keeps forgetting to take a snack before being active and also checking her sugars before starting an activity  Her mealtimes and snacks especially in the evenings are quite inconsistent  As before she does not have any consistent pattern of hyperglycemia after meals and not requiring regular mealtime  NovoLog coverage  FASTING readings are variable but no hypoglycemia and no consistently high readings  Today her fasting glucose was 70 but she had a low blood sugar of 56 last night at bedtime also from taking extra NovoLog  Again discussed possibility of using Dexcom sensor for CGM but she cannot afford this  She has seen the dietitian but she does not appear to have benefited much, some of this is related to her decreased memory    HYPOGLYCEMIA: Lowest blood sugar 24  May not have symptoms with low blood sugars and does not have early warning symptoms. Gets confused frequently and she depends on her mother to recognize low sugars and treat them.  Her mother knows how to give Glucagon injection   She will treat her low blood sugars usually with juice, does carry glucose tablets with her      DIET: She is trying to get protein with eggs at breakfast Meal times: Variable.Marland Kitchen  She will eat more when she is anxious  Monitors blood glucose:  At least 4-5 times a day.    Glucometer:Accu-Chek Aviva  Recent blood sugar average 127 with no consistent pattern  Previous readings:  PRE-MEAL Fasting Lunch Dinner Bedtime Overall  Glucose range:  85-349   36-269  33-205  33-349  Mean/median:  178  118  130  103  131        Wt Readings from Last 3 Encounters:  04/02/19 110 lb (49.9 kg)  03/20/19 111 lb (  50.3 kg)  02/18/19 113 lb (51.3 kg)    Lab Results  Component Value Date   HGBA1C 6.8 (H) 01/20/2019   HGBA1C 6.9 (H) 10/22/2018   HGBA1C 7.3 (H) 06/13/2018   Lab Results  Component Value Date   MICROALBUR 273.4 (H) 01/20/2019   LDLCALC 79 06/13/2018   CREATININE 0.80 01/20/2019        Allergies as of 04/02/2019      Reactions   Penicillins Anaphylaxis   Sulfonamide Derivatives Anaphylaxis      Medication List       Accurate as of April 02, 2019  8:46 PM. If you have any questions, ask your nurse or doctor.        Accu-Chek Aviva Plus w/Device Kit USE AS  DIRECTED   Accu-Chek Aviva Soln USE AS DIRECTED   Accu-Chek Softclix Lancets lancets Use as instructed to check blood sugar 4 times per day dx code E10.65 What changed: additional instructions   aspirin EC 81 MG tablet Take 81 mg by mouth every morning.   B-D SINGLE USE SWABS REGULAR Pads USE FOUR TIMES DAILY   B-D ULTRAFINE III SHORT PEN 31G X 8 MM Misc Generic drug: Insulin Pen Needle USE 1 PEN NEEDLE TO INJECT FOUR TIMES DAILY   diclofenac sodium 1 % Gel Commonly known as: VOLTAREN APPLY 2 GRAM EXTERNALLY TO THE AFFECTED AREA FOUR TIMES DAILY   donepezil 10 MG tablet Commonly known as: ARICEPT Take 1/2 tablet daily for 2 weeks, then increase to 1 tablet daily and continue   Fish Oil 1000 MG Caps Take 1 capsule by mouth daily.   gabapentin 100 MG capsule Commonly known as: NEURONTIN TAKE 1 TO 2 CAPSULES(100 TO 200 MG) BY MOUTH TWICE DAILY   glucagon 1 MG Solr injection Commonly known as: GlucaGen HypoKit Inject 1 mg into the skin once as needed for up to 1 dose for low blood sugar.   glucose blood test strip Commonly known as: Accu-Chek Aviva Plus TEST BLOOD SUGAR FOUR TIMES DAILY   Gvoke HypoPen 2-Pack 1 MG/0.2ML Soaj Generic drug: Glucagon INJECT 1 MG INTO THE SKIN AS NEEDED FOR HYPO   insulin aspart 100 UNIT/ML FlexPen Commonly known as: NovoLOG FlexPen INJECT 1-3 UNITS UNDER THE SKIN UP TO 4 TIMES DAILY.   lamoTRIgine 100 MG tablet Commonly known as: LAMICTAL Take 1 tablet (100 mg total) by mouth 2 (two) times daily.   lidocaine 5 % Commonly known as: LIDODERM Place 1 patch onto the skin daily. Remove & Discard patch within 12 hours or as directed by MD   lipase/protease/amylase 36000 UNITS Cpep capsule Commonly known as: Creon Take 2 capsules (72,000 Units total) by mouth 3 (three) times daily before meals.   loratadine 10 MG tablet Commonly known as: CLARITIN Take 10 mg by mouth as needed for allergies.   lovastatin 40 MG tablet Commonly  known as: MEVACOR TAKE 1 TABLET (40 MG TOTAL) BY MOUTH AT BEDTIME.   Magnesium 500 MG Tabs Take 1 tablet by mouth as needed.   metoCLOPramide 5 MG tablet Commonly known as: REGLAN TAKE 1 TABLET THREE TIMES DAILY as needed BEFORE MEALS   multivitamin tablet Take 1 tablet by mouth daily. Reported on 11/22/2015   omeprazole 40 MG capsule Commonly known as: PRILOSEC TAKE 1 CAPSULE(40 MG) BY MOUTH DAILY   traMADol 50 MG tablet Commonly known as: ULTRAM Take 1 tablet (50 mg total) by mouth every other day.   trazodone 300 MG tablet Commonly known as: DESYREL  Take 1 tablet (300 mg total) by mouth at bedtime.   Tyler Aas FlexTouch 100 UNIT/ML Sopn FlexTouch Pen Generic drug: insulin degludec INJECT 15 UNITS INTO THE SKIN DAILY.   vitamin B-12 1000 MCG tablet Commonly known as: CYANOCOBALAMIN Take 1 tablet by mouth daily.   vitamin E 1000 UNIT capsule Generic drug: vitamin E Take 1,000 Units by mouth daily.       Allergies:  Allergies  Allergen Reactions  . Penicillins Anaphylaxis  . Sulfonamide Derivatives Anaphylaxis    Past Medical History:  Diagnosis Date  . ALLERGIC RHINITIS   . Allergy   . Anemia   . Anxiety   . Arthritis   . BIPOLAR AFFECTIVE DISORDER   . Cataract   . DEPRESSION   . Diabetes mellitus type I ([place])   . DIABETES MELLITUS, TYPE II    follows with endo  . Fibromyalgia   . GERD   . HEPATITIS B   . HYPERLIPIDEMIA   . LOW BACK PAIN, CHRONIC   . MIGRAINE HEADACHE   . NARCOTIC ABUSE    hx of  . NECK PAIN, CHRONIC   . Neuropathy   . Seizures ([place])    pt states, "if my blood sugar drops to the 20's, I convulse.  It hasn't happened in a long time."  . SMOKER     Past Surgical History:  Procedure Laterality Date  . CESAREAN SECTION    . COLONOSCOPY    . TUBAL LIGATION    . UPPER GASTROINTESTINAL ENDOSCOPY      Family History  Problem Relation Age of Onset  . Arthritis Mother   . Colon cancer Mother        ? age of dx  . Arthritis  Father   . Kidney disease Father   . Kidney cancer Father   . Heart attack Brother   . Bladder Cancer Sister   . Hyperlipidemia Other   . Kidney cancer Paternal Aunt   . Anxiety disorder Neg Hx   . Bipolar disorder Neg Hx   . Depression Neg Hx   . Breast cancer Neg Hx   . Esophageal cancer Neg Hx   . Stomach cancer Neg Hx   . Rectal cancer Neg Hx     Social History:  reports that she has been smoking cigarettes. She has a 60.00 pack-year smoking history. She has never used smokeless tobacco. She reports current alcohol use. She reports that she does not use drugs.  Review of Systems:   Has probable gastroparesis treated with Reglan as needed  NEUROPATHY: She has burning in her legs and feet at times treated with gabapentin   DEPRESSION: She has had long-standing depression and anxiety with variable level of control Is on Lamictal Recently complains of lack of motivation and fatigue  HYPERLIPIDEMIA: The lipid abnormality consists of elevated LDL   Is  on lovastatin 40 mg with  good control   Lab Results  Component Value Date   CHOL 173 06/13/2018   HDL 79.20 06/13/2018   LDLCALC 79 06/13/2018   LDLDIRECT 101.7 03/26/2012   TRIG 73.0 06/13/2018   CHOLHDL 2 06/13/2018    Memory difficulties: She is on Aricept by the neurologist, is going to start increasing the dose to 10 mg now   Examination:   BP (!) 102/58   Pulse 73   Temp 97.9 F (36.6 C)   Wt 110 lb (49.9 kg)   SpO2 94%   BMI 19.18 kg/m   Body mass index  is 19.18 kg/m.    ASSESSMENT/ PLAN:   Diabetes type 1 with poor control  See history of present illness for detailed discussion of current insulin, blood sugar patterns, problems identified  A1c has been under 7%  As before her A1c is relatively low because of regular episodes of hypoglycemia as well as minimal postprandial hyperglycemia She has not has many high readings in the mornings fasting especially with taking 9 units of Tresiba instead of  8 units of blood she did this on her own to counteract her extra snacking However given that she has tendency to hypoglycemia frequently during the day when she is more active may be better to reduce the basal insulin down again May be averaging about her high and low blood sugars and having both extremes regularly now Fasting readings are high most likely from eating high fat snacks late at night However because of her tendency to low sugars during the day will not be able to increase her basal insulin  She is again refusing the Dexcom sensor because of cost  However may consider using freestyle libre on the next visit, not clear if this would be as accurate or give her adequate warnings about low sugars  She needs to avoid hypoglycemia because of continued problems with memory loss which may or may not be related Currently not benefiting from 5 mg Aricept  Today discussed the need to avoid hypoglycemia with increased activity with extra snacks before starting the activity Also needs to check her sugar before doing housework like vacuuming She will also avoid taking NovoLog insulin just for snacks at night without confirming that her blood sugars are going up  Follow-up in 2 months  Counseling time on subjects discussed in assessment and plan sections is over 50% of today's 25 minute visit    Patient Instructions  Snack before housework  No Novolog unless sugar going up  8 [place]  She will come in later this month to get the high-dose influenza vaccine    [name] 04/02/2019, 8:46 PM

## 2019-04-04 ENCOUNTER — Other Ambulatory Visit: Payer: Self-pay | Admitting: Endocrinology

## 2019-04-04 ENCOUNTER — Telehealth (HOSPITAL_COMMUNITY): Payer: Self-pay

## 2019-04-04 NOTE — Telephone Encounter (Signed)
Patient is calling to report that she is having symptoms of depression, she is very sleepy and has no energy. Patient thinks she may need an adjustment to her medications. Please review and advise, thank you

## 2019-04-10 NOTE — Telephone Encounter (Signed)
Put on her on my schedule for next week

## 2019-04-10 NOTE — Telephone Encounter (Signed)
Gave patient information to the front desk to get her scheduled

## 2019-04-16 ENCOUNTER — Encounter: Payer: Self-pay | Admitting: Gastroenterology

## 2019-04-16 NOTE — Progress Notes (Signed)
[facility] [place], [place] [address] Phone: [phone] Subjective:   [name], am serving as a scribe for Dr. Hulan Saas.  CC: Left finger pain follow-up  WCB:[id number]   02/18/2019 Patient given injection for trigger finger.  He tolerated the procedure well.  Bracing at night, topical anti-inflammatories over-the-counter medication.  Follow-up with me again in 6 to 8 weeks.  Patient to avoid repetitive activity  Update 04/17/2019 Leslie Lane is a 63 y.o. female coming in with complaint of left hand 4th finger PIP joint stiffness. Is able to make a fist now. No longer experiencing catching sensation.  Patient is doing relatively well overall.  Discussed posture dynamics and has been doing that occasionally.  Patient states that overall 90% better still    Past Medical History:  Diagnosis Date  . ALLERGIC RHINITIS   . Allergy   . Anemia   . Anxiety   . Arthritis   . BIPOLAR AFFECTIVE DISORDER   . Cataract   . DEPRESSION   . Diabetes mellitus type I ([place])   . DIABETES MELLITUS, TYPE II    follows with endo  . Fibromyalgia   . GERD   . HEPATITIS B   . HYPERLIPIDEMIA   . LOW BACK PAIN, CHRONIC   . MIGRAINE HEADACHE   . NARCOTIC ABUSE    hx of  . NECK PAIN, CHRONIC   . Neuropathy   . Seizures ([place])    pt states, "if my blood sugar drops to the 20's, I convulse.  It hasn't happened in a long time."  . SMOKER    Past Surgical History:  Procedure Laterality Date  . CESAREAN SECTION    . COLONOSCOPY    . TUBAL LIGATION    . UPPER GASTROINTESTINAL ENDOSCOPY     Social History   Socioeconomic History  . Marital status: Divorced    Spouse name: Not on file  . Number of children: 1  . Years of education: Not on file  . Highest education level: Not on file  Occupational History  . Occupation: diabled  Social Needs  . Financial resource strain: Not on file  . Food insecurity    Worry: Not on file   Inability: Not on file  . Transportation needs    Medical: Not on file    Non-medical: Not on file  Tobacco Use  . Smoking status: Current Every Day Smoker    Packs/day: 1.50    Years: 40.00    Pack years: 60.00    Types: Cigarettes  . Smokeless tobacco: Never Used  . Tobacco comment: tobacco info given 04/18/16  Substance and Sexual Activity  . Alcohol use: Yes    Alcohol/week: 0.0 standard drinks    Comment: occasionally- once every 2 weeks 1-2 beers  . Drug use: No  . Sexual activity: Never    Birth control/protection: Surgical, Abstinence  Lifestyle  . Physical activity    Days per week: Not on file    Minutes per session: Not on file  . Stress: Not on file  Relationships  . Social Herbalist on phone: Not on file    Gets together: Not on file    Attends religious service: Not on file    Active member of club or organization: Not on file    Attends meetings of clubs or organizations: Not on file    Relationship status: Not on file  Other Topics Concern  .  Not on file  Social History Narrative   Divorced, disabled   Lives with parents   Right handed    Allergies  Allergen Reactions  . Penicillins Anaphylaxis  . Sulfonamide Derivatives Anaphylaxis   Family History  Problem Relation Age of Onset  . Arthritis Mother   . Colon cancer Mother        ? age of dx  . Arthritis Father   . Kidney disease Father   . Kidney cancer Father   . Heart attack Brother   . Bladder Cancer Sister   . Hyperlipidemia Other   . Kidney cancer Paternal Aunt   . Anxiety disorder Neg Hx   . Bipolar disorder Neg Hx   . Depression Neg Hx   . Breast cancer Neg Hx   . Esophageal cancer Neg Hx   . Stomach cancer Neg Hx   . Rectal cancer Neg Hx     Current Outpatient Medications (Endocrine & Metabolic):  .  glucagon (GLUCAGEN HYPOKIT) 1 MG SOLR injection, Inject 1 mg into the skin once as needed for up to 1 dose for low blood sugar. Marland Kitchen  GVOKE HYPOPEN 2-PACK 1 MG/0.2ML  SOAJ, INJECT 1 MG INTO THE SKIN AS NEEDED FOR HYPO .  insulin aspart (NOVOLOG FLEXPEN) 100 UNIT/ML FlexPen, INJECT 1-3 UNITS UNDER THE SKIN UP TO 4 TIMES DAILY. Marland Kitchen  TRESIBA FLEXTOUCH 100 UNIT/ML SOPN FlexTouch Pen, INJECT 15 UNITS INTO THE SKIN DAILY.  Current Outpatient Medications (Cardiovascular):  .  lovastatin (MEVACOR) 40 MG tablet, TAKE 1 TABLET (40 MG TOTAL) BY MOUTH AT BEDTIME.  Current Outpatient Medications (Respiratory):  .  loratadine (CLARITIN) 10 MG tablet, Take 10 mg by mouth as needed for allergies.  Current Outpatient Medications (Analgesics):  .  aspirin EC 81 MG tablet, Take 81 mg by mouth every morning. .  traMADol (ULTRAM) 50 MG tablet, Take 1 tablet (50 mg total) by mouth every other day.  Current Outpatient Medications (Hematological):  .  vitamin B-12 (CYANOCOBALAMIN) 1000 MCG tablet, Take 1 tablet by mouth daily.  Current Outpatient Medications (Other):  Marland Kitchen  ACCU-CHEK SOFTCLIX LANCETS lancets, Use as instructed to check blood sugar 4 times per day dx code E10.65 (Patient taking differently: Use as instructed to check blood sugar 8 times per day dx code E10.65) .  Alcohol Swabs (B-D SINGLE USE SWABS REGULAR) PADS, USE FOUR TIMES DAILY .  B-D ULTRAFINE III SHORT PEN 31G X 8 MM MISC, USE 1 PEN NEEDLE TO INJECT FOUR TIMES DAILY .  Blood Glucose Calibration (ACCU-CHEK AVIVA) SOLN, USE AS DIRECTED .  Blood Glucose Monitoring Suppl (ACCU-CHEK AVIVA PLUS) w/Device KIT, USE AS DIRECTED .  diclofenac sodium (VOLTAREN) 1 % GEL, APPLY 2 GRAM EXTERNALLY TO THE AFFECTED AREA FOUR TIMES DAILY .  donepezil (ARICEPT) 10 MG tablet, Take 1/2 tablet daily for 2 weeks, then increase to 1 tablet daily and continue .  gabapentin (NEURONTIN) 100 MG capsule, TAKE 1 TO 2 CAPSULES(100 TO 200 MG) BY MOUTH TWICE DAILY .  glucose blood (ACCU-CHEK AVIVA PLUS) test strip, TEST BLOOD SUGAR FOUR TIMES DAILY .  lamoTRIgine (LAMICTAL) 100 MG tablet, Take 1 tablet (100 mg total) by mouth 2 (two) times  daily. Marland Kitchen  lidocaine (LIDODERM) 5 %, Place 1 patch onto the skin daily. Remove & Discard patch within 12 hours or as directed by MD .  lipase/protease/amylase (CREON) 36000 UNITS CPEP capsule, Take 2 capsules (72,000 Units total) by mouth 3 (three) times daily before meals. .  Magnesium 500  MG TABS, Take 1 tablet by mouth as needed. .  metoCLOPramide (REGLAN) 5 MG tablet, TAKE 1 TABLET THREE TIMES DAILY as needed BEFORE MEALS .  Multiple Vitamin (MULTIVITAMIN) tablet, Take 1 tablet by mouth daily. Reported on 11/22/2015 .  Omega-3 Fatty Acids (FISH OIL) 1000 MG CAPS, Take 1 capsule by mouth daily. Marland Kitchen  omeprazole (PRILOSEC) 40 MG capsule, TAKE 1 CAPSULE(40 MG) BY MOUTH DAILY .  trazodone (DESYREL) 300 MG tablet, Take 1 tablet (300 mg total) by mouth at bedtime. .  vitamin E (VITAMIN E) 1000 UNIT capsule, Take 1,000 Units by mouth daily.      Past medical history, social, surgical and family history all reviewed in electronic medical record.  No pertanent information unless stated regarding to the chief complaint.   Review of Systems:  No headache, visual changes, nausea, vomiting, diarrhea, constipation, dizziness, abdominal pain, skin rash, fevers, chills, night sweats, weight Lane, swollen lymph nodes, body aches, joint swelling, muscle aches, chest pain, shortness of breath, mood changes.   Objective  Blood pressure 112/64, pulse 78, height 5' 3.5" (1.613 m), SpO2 96 %.    General: No apparent distress alert and oriented x3 mood and affect normal, dressed appropriately.  HEENT: Pupils equal, extraocular movements intact  Respiratory: Patient's speak in full sentences and does not appear short of breath  Cardiovascular: No lower extremity edema, non tender, no erythema  Skin: Warm dry intact with no signs of infection or rash on extremities or on axial skeleton.  Abdomen: Soft nontender  Neuro: Cranial nerves II through XII are intact, neurovascularly intact in all extremities with 2+ DTRs  and 2+ pulses.  Lymph: No lymphadenopathy of posterior or anterior cervical chain or axillae bilaterally.  Gait normal with good balance and coordination.  MSK:  Non tender with full range of motion and good stability and symmetric strength and tone of shoulders, elbows, wrist, hip, knee and ankles bilaterally.  Left hand exam shows the patient does have an A1 trigger nodule noted with some mild triggering but no pain on exam today.  A2 pulley is completely normal.   Impression and Recommendations:      The above documentation has been reviewed and is accurate and complete Lyndal Pulley, DO       Note: This dictation was prepared with Dragon dictation along with smaller phrase technology. Any transcriptional errors that result from this process are unintentional.

## 2019-04-17 ENCOUNTER — Encounter (HOSPITAL_COMMUNITY): Payer: Self-pay | Admitting: Psychiatry

## 2019-04-17 ENCOUNTER — Ambulatory Visit (INDEPENDENT_AMBULATORY_CARE_PROVIDER_SITE_OTHER): Payer: Medicare HMO | Admitting: Psychiatry

## 2019-04-17 ENCOUNTER — Encounter: Payer: Self-pay | Admitting: Family Medicine

## 2019-04-17 ENCOUNTER — Ambulatory Visit: Payer: Medicare HMO | Admitting: Family Medicine

## 2019-04-17 ENCOUNTER — Other Ambulatory Visit: Payer: Self-pay

## 2019-04-17 DIAGNOSIS — G4701 Insomnia due to medical condition: Secondary | ICD-10-CM

## 2019-04-17 DIAGNOSIS — F4010 Social phobia, unspecified: Secondary | ICD-10-CM

## 2019-04-17 DIAGNOSIS — F332 Major depressive disorder, recurrent severe without psychotic features: Secondary | ICD-10-CM

## 2019-04-17 DIAGNOSIS — M65342 Trigger finger, left ring finger: Secondary | ICD-10-CM | POA: Diagnosis not present

## 2019-04-17 MED ORDER — LAMOTRIGINE 100 MG PO TABS: 100.0000 mg | ORAL_TABLET | Freq: Two times a day (BID) | ORAL | 0 refills | Status: DC

## 2019-04-17 MED ORDER — TRAZODONE HCL 100 MG PO TABS: 400.0000 mg | ORAL_TABLET | Freq: Every day | ORAL | 0 refills | Status: DC

## 2019-04-17 NOTE — Patient Instructions (Signed)
Good to see you  Tae finger to other finger daily for 2 weeks. Brace at night See me again in 2 months if not perfect

## 2019-04-17 NOTE — Progress Notes (Signed)
Virtual Visit via Telephone Note  I connected with Leslie Lane  on 04/17/19 at  4:30 PM EDT by telephone and verified that I am speaking with the correct person using two identifiers.  Location: Patient: home Provider: office   I discussed the limitations, risks, security and privacy concerns of performing an evaluation and management service by telephone and the availability of in person appointments. I also discussed with the patient that there may be a patient responsible charge related to this service. The patient expressed understanding and agreed to proceed.   History of Present Illness: Leslie Lane shares that her depression is getting worse. She has not motivation and very poor concentration. Nothing is holds her interest. She does the things she has too. Leslie Lane spends all her time in bed mostly. She is smoking more and is up to 2 ppd. It is a way to relieve the boredom and a way to procrastinate on household chores.  She used to love watching tv and now doesn't want to watch anymore. This is not how she used to be. Her sleep is poor due to lying in bed all day. She does talk to her friend some but finds that she doesn't seem to care much about what her friends talk about. She prefers to be alone. Leslie Lane is overwhelmed with anxiety about her mom and sister's declining health. The people who she does talk to have told her she is depressed and needs help.  Pt denies SI/HI- "No I hadn't got that bad yet".     Observations/Objective: I spoke with Leslie Lane on the phone.  Pt was calm, pleasant and cooperative.  Pt was engaged in the conversation and answered questions appropriately.  Speech was clear and coherent with normal rate, tone and volume.  Mood is depressed and anxious, affect is congruent. Thought processes are coherent, goal oriented and intact.  Thought content is with ruminations.  Pt denies SI/HI.   Pt denies auditory and visual hallucinations and did not appear to be responding  to internal stimuli.  Memory and concentration are good.  Fund of knowledge and use of language are average.  Insight and judgment are fair.  I am unable to comment on psychomotor activity, general appearance, hygiene, or eye contact as I was unable to physically see the patient on the phone.  Vital signs not available since interview conducted virtually.     Assessment and Plan: MDD-recurrent, severe without psychotic features; GAD; Social anxiety d/o; Insomnia  Status of current symptoms: worsening depression  She is unable to afford [facility]  Previous med trial were ineffective  Her finances are limited and she is on a strict budget.  Lamictal 100mg  po BID for MDD  Increase Trazodone 400mg  po qHS for MDD and insomnia  We discussed many options of meds- both newer and older meds but pt declined all options due to cost or SE. She repeatedly stated "I guess I will just stay with what I am on and stay depressed". She did agree to an increase in Trazodone.   Follow Up Instructions: In 4 weeks or sooner if needed   I discussed the assessment and treatment plan with the patient. The patient was provided an opportunity to ask questions and all were answered. The patient agreed with the plan and demonstrated an understanding of the instructions.   The patient was advised to call back or seek an in-person evaluation if the symptoms worsen or if the condition fails to improve as anticipated.  I provided 35 minutes of non-face-to-face time during this encounter.   Charlcie Cradle, MD

## 2019-04-17 NOTE — Assessment & Plan Note (Signed)
Patient is doing better in 2 months after the injection.  Patient still has some mild discomfort.  Nothing seems to have another one at the A1 pulley.  Patient is to increase activity as tolerated.  Patient will follow-up with me again 2 months and if worsening pain consider injections in the A1 and A2 pulley

## 2019-04-24 ENCOUNTER — Other Ambulatory Visit: Payer: Self-pay

## 2019-04-24 ENCOUNTER — Ambulatory Visit (INDEPENDENT_AMBULATORY_CARE_PROVIDER_SITE_OTHER): Payer: Medicare HMO

## 2019-04-24 ENCOUNTER — Encounter: Payer: Self-pay | Admitting: Endocrinology

## 2019-04-24 DIAGNOSIS — Z23 Encounter for immunization: Secondary | ICD-10-CM | POA: Diagnosis not present

## 2019-04-25 ENCOUNTER — Other Ambulatory Visit: Payer: Self-pay | Admitting: Internal Medicine

## 2019-04-30 DIAGNOSIS — L700 Acne vulgaris: Secondary | ICD-10-CM | POA: Diagnosis not present

## 2019-04-30 DIAGNOSIS — L720 Epidermal cyst: Secondary | ICD-10-CM | POA: Diagnosis not present

## 2019-05-02 ENCOUNTER — Other Ambulatory Visit: Payer: Self-pay | Admitting: Endocrinology

## 2019-05-09 ENCOUNTER — Other Ambulatory Visit: Payer: Self-pay | Admitting: Internal Medicine

## 2019-05-09 DIAGNOSIS — Z1231 Encounter for screening mammogram for malignant neoplasm of breast: Secondary | ICD-10-CM

## 2019-05-15 ENCOUNTER — Ambulatory Visit (INDEPENDENT_AMBULATORY_CARE_PROVIDER_SITE_OTHER): Payer: Medicare HMO | Admitting: Psychiatry

## 2019-05-15 ENCOUNTER — Other Ambulatory Visit: Payer: Self-pay

## 2019-05-15 ENCOUNTER — Encounter (HOSPITAL_COMMUNITY): Payer: Self-pay | Admitting: Psychiatry

## 2019-05-15 DIAGNOSIS — F4010 Social phobia, unspecified: Secondary | ICD-10-CM

## 2019-05-15 DIAGNOSIS — G4701 Insomnia due to medical condition: Secondary | ICD-10-CM | POA: Diagnosis not present

## 2019-05-15 DIAGNOSIS — F332 Major depressive disorder, recurrent severe without psychotic features: Secondary | ICD-10-CM | POA: Diagnosis not present

## 2019-05-15 MED ORDER — TRAZODONE HCL 100 MG PO TABS: 400.0000 mg | ORAL_TABLET | Freq: Every day | ORAL | 0 refills | Status: DC

## 2019-05-15 MED ORDER — LAMOTRIGINE 100 MG PO TABS: 100.0000 mg | ORAL_TABLET | Freq: Two times a day (BID) | ORAL | 0 refills | Status: DC

## 2019-05-15 NOTE — Progress Notes (Signed)
Virtual Visit via Telephone Note  I connected with Leslie Lane  on 05/15/19 at  2:30 PM EST by telephone and verified that I am speaking with the correct person using two identifiers.  Location: Patient: home Provider: office   I discussed the limitations, risks, security and privacy concerns of performing an evaluation and management service by telephone and the availability of in person appointments. I also discussed with the patient that there may be a patient responsible charge related to this service. The patient expressed understanding and agreed to proceed.   History of Present Illness: Pt reports no change in symptoms. She is taking the increase in Trazodone but has not noticed any improvement in depression. She is depressed, unmotivated, bored and uninterested in things. Her concentration is very poor and she can't focus on tv or reading. Her energy is very low and all activities make her tired. Pt has a follow up with her PCP in December. "I have been checked for everything!". She states her body gets immune to pills and they stop working after a while. Her anxiety is ongoing. "I can hang in there as long as I need to". She does avoid a lot of social interaction even with close friends. Pt denies SI/HI. "Oh shot NO. That's something I am not going to do".     Observations/Objective: I spoke with Leslie Lane on the phone.  Pt was calm, pleasant and cooperative.  Pt was engaged in the conversation and answered questions appropriately.  Speech was clear and coherent with normal rate, tone and volume. Voice is raspy as consistent with smoker.  Mood is depressed and anxious, affect is congruent. Thought processes are coherent, goal oriented and intact.  Thought content is with ruminations.  Pt denies SI/HI.   Pt denies auditory and visual hallucinations and did not appear to be responding to internal stimuli.  Memory and concentration are good.  Fund of knowledge and use of language are  average.  Insight and judgment are fair.  I am unable to comment on psychomotor activity, general appearance, hygiene, or eye contact as I was unable to physically see the patient on the phone.  Vital signs not available since interview conducted virtually.    I reviewed the information below on 05/15/2019 and have updated it Assessment and Plan: MDD-recurrent, severe without psychotic features; GAD; Social anxiety d/o; Insomnia  Status of current symptoms: no change   She is unable to afford [facility]   Previous med trial were ineffective   Her finances are limited and she is on a strict budget.   Lamictal 100mg  po BID for MDD   Trazodone 400mg  po qHS for MDD and insomnia   We discussed many options of meds- both newer and older meds but pt declined all options due to cost or SE. We discussed using a small dose of Ritalin but pt declined due to concerns about SE. She repeatedly stated "I guess I will just stay feeling like I am feeling".    Follow Up Instructions: In 12 weeks or sooner if needed   I discussed the assessment and treatment plan with the patient. The patient was provided an opportunity to ask questions and all were answered. The patient agreed with the plan and demonstrated an understanding of the instructions.   The patient was advised to call back or seek an in-person evaluation if the symptoms worsen or if the condition fails to improve as anticipated.  I provided 25 minutes of non-face-to-face time  during this encounter.   Charlcie Cradle, MD

## 2019-05-22 ENCOUNTER — Other Ambulatory Visit: Payer: Self-pay | Admitting: Endocrinology

## 2019-06-06 ENCOUNTER — Other Ambulatory Visit: Payer: Self-pay | Admitting: Endocrinology

## 2019-06-06 ENCOUNTER — Ambulatory Visit: Payer: Medicare HMO | Admitting: Internal Medicine

## 2019-06-16 ENCOUNTER — Other Ambulatory Visit (INDEPENDENT_AMBULATORY_CARE_PROVIDER_SITE_OTHER): Payer: Medicare HMO

## 2019-06-16 ENCOUNTER — Other Ambulatory Visit: Payer: Self-pay

## 2019-06-16 DIAGNOSIS — E1065 Type 1 diabetes mellitus with hyperglycemia: Secondary | ICD-10-CM | POA: Diagnosis not present

## 2019-06-16 LAB — COMPREHENSIVE METABOLIC PANEL
ALT: 15 U/L (ref 0–35)
AST: 22 U/L (ref 0–37)
Albumin: 4.3 g/dL (ref 3.5–5.2)
Alkaline Phosphatase: 56 U/L (ref 39–117)
BUN: 20 mg/dL (ref 6–23)
CO2: 33 mEq/L — ABNORMAL HIGH (ref 19–32)
Calcium: 9.5 mg/dL (ref 8.4–10.5)
Chloride: 99 mEq/L (ref 96–112)
Creatinine, Ser: 0.8 mg/dL (ref 0.40–1.20)
GFR: 71.95 mL/min (ref >60.00)
Glucose, Bld: 141 mg/dL — ABNORMAL HIGH (ref 70–99)
Potassium: 4.2 mEq/L (ref 3.5–5.1)
Sodium: 137 mEq/L (ref 135–145)
Total Bilirubin: 0.3 mg/dL (ref 0.2–1.2)
Total Protein: 7 g/dL (ref 6.0–8.3)

## 2019-06-16 LAB — HEMOGLOBIN A1C: Hgb A1c MFr Bld: 6.5 % (ref 4.6–6.5)

## 2019-06-16 LAB — LIPID PANEL
Cholesterol: 178 mg/dL (ref 0–200)
HDL: 80.8 mg/dL (ref >39.00)
LDL Cholesterol: 89 mg/dL (ref 0–99)
NonHDL: 97.69
Total CHOL/HDL Ratio: 2
Triglycerides: 45 mg/dL (ref 0.0–149.0)
VLDL: 9 mg/dL (ref 0.0–40.0)

## 2019-06-16 NOTE — Progress Notes (Signed)
[facility] [place], [place] [address] Phone: [phone] Subjective:   [name], am serving as a scribe for Dr. Hulan Saas. This visit occurred during the SARS-CoV-2 public health emergency.  Safety protocols were in place, including screening questions prior to the visit, additional usage of staff PPE, and extensive cleaning of exam room while observing appropriate contact time as indicated for disinfecting solutions.    CC: Hand pain  BTD:[id number]   04/17/2019 Patient is doing better in 2 months after the injection.  Patient still has some mild discomfort.  Nothing seems to have another one at the A1 pulley.  Patient is to increase activity as tolerated.  Patient will follow-up with me again 2 months and if worsening pain consider injections in the A1 and A2 pulley  Update 06/17/2019 Leslie Lane is a 63 y.o. female coming in with complaint of left hand trigger finger. Injection did help to alleviate triggering. Complains of tightness in left hand 4th finger.   States that 2 days after her last visit she suffered a [address] injury. Pain over right PIP joint of middle finger. Pain has been improving but is still not 100%.    Trigger finger noted 2 months ago was doing significantly better after injection that was in August.  Past Medical History:  Diagnosis Date  . ALLERGIC RHINITIS   . Allergy   . Anemia   . Anxiety   . Arthritis   . BIPOLAR AFFECTIVE DISORDER   . Cataract   . DEPRESSION   . Diabetes mellitus type I ([facility])   . DIABETES MELLITUS, TYPE II    follows with endo  . Fibromyalgia   . GERD   . HEPATITIS B   . HYPERLIPIDEMIA   . LOW BACK PAIN, CHRONIC   . MIGRAINE HEADACHE   . NARCOTIC ABUSE    hx of  . NECK PAIN, CHRONIC   . Neuropathy   . Seizures ([place])    pt states, "if my blood sugar drops to the 20's, I convulse.  It hasn't happened in a long time."  . SMOKER    Past Surgical History:  Procedure  Laterality Date  . CESAREAN SECTION    . COLONOSCOPY    . TUBAL LIGATION    . UPPER GASTROINTESTINAL ENDOSCOPY     Social History   Socioeconomic History  . Marital status: Divorced    Spouse name: Not on file  . Number of children: 1  . Years of education: Not on file  . Highest education level: Not on file  Occupational History  . Occupation: diabled  Tobacco Use  . Smoking status: Current Every Day Smoker    Packs/day: 2.00    Years: 40.00    Pack years: 80.00    Types: Cigarettes  . Smokeless tobacco: Never Used  Substance and Sexual Activity  . Alcohol use: Yes    Alcohol/week: 0.0 standard drinks    Comment: occasionally- once every 2 weeks 1-2 beers  . Drug use: No  . Sexual activity: Never    Birth control/protection: Surgical, Abstinence  Other Topics Concern  . Not on file  Social History Narrative   Divorced, disabled   Lives with parents   Right handed    Social Determinants of Health   Financial Resource Strain:   . Difficulty of Paying Living Expenses: Not on file  Food Insecurity:   . Worried About Charity fundraiser in the Last Year:  Not on file  . Ran Out of Food in the Last Year: Not on file  Transportation Needs:   . Lack of Transportation (Medical): Not on file  . Lack of Transportation (Non-Medical): Not on file  Physical Activity:   . Days of Exercise per Week: Not on file  . Minutes of Exercise per Session: Not on file  Stress:   . Feeling of Stress : Not on file  Social Connections:   . Frequency of Communication with Friends and Family: Not on file  . Frequency of Social Gatherings with Friends and Family: Not on file  . Attends Religious Services: Not on file  . Active Member of Clubs or Organizations: Not on file  . Attends Archivist Meetings: Not on file  . Marital Status: Not on file   Allergies  Allergen Reactions  . Penicillins Anaphylaxis  . Sulfonamide Derivatives Anaphylaxis   Family History  Problem  Relation Age of Onset  . Arthritis Mother   . Colon cancer Mother        ? age of dx  . Arthritis Father   . Kidney disease Father   . Kidney cancer Father   . Heart attack Brother   . Bladder Cancer Sister   . Hyperlipidemia Other   . Kidney cancer Paternal Aunt   . Anxiety disorder Neg Hx   . Bipolar disorder Neg Hx   . Depression Neg Hx   . Breast cancer Neg Hx   . Esophageal cancer Neg Hx   . Stomach cancer Neg Hx   . Rectal cancer Neg Hx     Current Outpatient Medications (Endocrine & Metabolic):  .  glucagon (GLUCAGEN HYPOKIT) 1 MG SOLR injection, Inject 1 mg into the skin once as needed for up to 1 dose for low blood sugar. Marland Kitchen  GVOKE HYPOPEN 2-PACK 1 MG/0.2ML SOAJ, INJECT 1 MG INTO THE SKIN AS NEEDED FOR HYPO .  insulin aspart (NOVOLOG FLEXPEN) 100 UNIT/ML FlexPen, INJECT 1-3 UNITS UNDER THE SKIN UP TO 4 TIMES DAILY. Marland Kitchen  TRESIBA FLEXTOUCH 100 UNIT/ML SOPN FlexTouch Pen, INJECT 15 UNITS INTO THE SKIN DAILY.  Current Outpatient Medications (Cardiovascular):  .  lovastatin (MEVACOR) 40 MG tablet, TAKE 1 TABLET AT BEDTIME  Current Outpatient Medications (Respiratory):  .  loratadine (CLARITIN) 10 MG tablet, Take 10 mg by mouth as needed for allergies.  Current Outpatient Medications (Analgesics):  .  aspirin EC 81 MG tablet, Take 81 mg by mouth every morning. .  traMADol (ULTRAM) 50 MG tablet, Take 1 tablet (50 mg total) by mouth every other day.  Current Outpatient Medications (Hematological):  .  vitamin B-12 (CYANOCOBALAMIN) 1000 MCG tablet, Take 1 tablet by mouth daily.  Current Outpatient Medications (Other):  Marland Kitchen  ACCU-CHEK AVIVA PLUS test strip, TEST BLOOD SUGAR FOUR TIMES DAILY .  ACCU-CHEK SOFTCLIX LANCETS lancets, Use as instructed to check blood sugar 4 times per day dx code E10.65 (Patient taking differently: Use as instructed to check blood sugar 8 times per day dx code E10.65) .  Alcohol Swabs (B-D SINGLE USE SWABS REGULAR) PADS, USE FOUR TIMES DAILY .  B-D  ULTRAFINE III SHORT PEN 31G X 8 MM MISC, USE 1 PEN NEEDLE TO INJECT FOUR TIMES DAILY .  Blood Glucose Calibration (ACCU-CHEK AVIVA) SOLN, USE AS DIRECTED .  Blood Glucose Monitoring Suppl (ACCU-CHEK AVIVA PLUS) w/Device KIT, USE AS DIRECTED .  diclofenac sodium (VOLTAREN) 1 % GEL, APPLY 2 GRAMS EXTERNALLY TO THE AFFECTED AREA FOUR TIMES DAILY .  donepezil (ARICEPT) 10 MG tablet, Take 1/2 tablet daily for 2 weeks, then increase to 1 tablet daily and continue .  gabapentin (NEURONTIN) 100 MG capsule, TAKE 1 TO 2 CAPSULES(100 TO 200 MG) BY MOUTH TWICE DAILY .  lamoTRIgine (LAMICTAL) 100 MG tablet, Take 1 tablet (100 mg total) by mouth 2 (two) times daily. Marland Kitchen  lidocaine (LIDODERM) 5 %, APPLY 1 PATCH ONTO THE SKIN DAILY. REMOVE AND DISCARD PATCH WITHIN 12 HOURS OR AS DIRECTED BY MD .  lipase/protease/amylase (CREON) 36000 UNITS CPEP capsule, Take 2 capsules (72,000 Units total) by mouth 3 (three) times daily before meals. (Patient not taking: Reported on 06/17/2019) .  Magnesium 500 MG TABS, Take 1 tablet by mouth as needed. .  metoCLOPramide (REGLAN) 5 MG tablet, TAKE 1 TABLET THREE TIMES DAILY BEFORE MEALS AS NEEDED .  Multiple Vitamin (MULTIVITAMIN) tablet, Take 1 tablet by mouth daily. Reported on 11/22/2015 .  Omega-3 Fatty Acids (FISH OIL) 1000 MG CAPS, Take 1 capsule by mouth daily. Marland Kitchen  omeprazole (PRILOSEC) 40 MG capsule, TAKE 1 CAPSULE(40 MG) BY MOUTH DAILY .  traZODone (DESYREL) 100 MG tablet, Take 4 tablets (400 mg total) by mouth at bedtime. .  vitamin E (VITAMIN E) 1000 UNIT capsule, Take 1,000 Units by mouth daily.      Past medical history, social, surgical and family history all reviewed in electronic medical record.  No pertanent information unless stated regarding to the chief complaint.   Review of Systems:  No headache, visual changes, nausea, vomiting, diarrhea, constipation, dizziness, abdominal pain, skin rash, fevers, chills, night sweats, weight loss, swollen lymph nodes, body  aches, joint swelling, muscle aches, chest pain, shortness of breath, mood changes.   Objective  Height 5' 3.5" (1.613 m), weight 112 lb (50.8 kg).    General: No apparent distress alert and oriented x3 mood and affect normal, dressed appropriately.  HEENT: Pupils equal, extraocular movements intact  Respiratory: Patient's speak in full sentences and does not appear short of breath  Cardiovascular: No lower extremity edema, non tender, no erythema  Skin: Warm dry intact with no signs of infection or rash on extremities or on axial skeleton.  Abdomen: Soft nontender  Neuro: Cranial nerves II through XII are intact, neurovascularly intact in all extremities with 2+ DTRs and 2+ pulses.  Lymph: No lymphadenopathy of posterior or anterior cervical chain or axillae bilaterally.  Gait normal with good balance and coordination.  MSK:  Non tender with full range of motion and good stability and symmetric strength and tone of shoulders, elbows, wrist, hip, knee and ankles bilaterally.  Mild arthritic changes multiple joints Left hand exam shows arthritic changes but no more triggering noted.  Nontender at the A2 pulley that was previously seen with a trigger finger.  Contralateral hand shows the patient's middle finger at the PIP does have very mild swelling noted.  No angulation, full range of motion.  Limited musculoskeletal ultrasound was performed and interpreted by Lyndal Pulley  Limited musculoskeletal ultrasound of patient's middle finger shows a trace effusion noted of the joint but no cortical irregularity of the DIP.  No abnormality of the middle finger at the MP either. Impression: Finger sprain   Impression and Recommendations:      The above documentation has been reviewed and is accurate and complete Lyndal Pulley, DO       Note: This dictation was prepared with Dragon dictation along with smaller phrase technology. Any transcriptional errors that result from this process are  unintentional.

## 2019-06-17 ENCOUNTER — Encounter: Payer: Self-pay | Admitting: Internal Medicine

## 2019-06-17 ENCOUNTER — Encounter: Payer: Self-pay | Admitting: Family Medicine

## 2019-06-17 ENCOUNTER — Ambulatory Visit: Payer: Medicare HMO | Admitting: Family Medicine

## 2019-06-17 ENCOUNTER — Ambulatory Visit (INDEPENDENT_AMBULATORY_CARE_PROVIDER_SITE_OTHER): Payer: Medicare HMO | Admitting: Internal Medicine

## 2019-06-17 ENCOUNTER — Ambulatory Visit: Payer: Self-pay

## 2019-06-17 VITALS — Ht 63.5 in | Wt 112.0 lb

## 2019-06-17 VITALS — BP 110/60 | HR 72 | Temp 98.3°F | Ht 63.5 in | Wt 112.0 lb

## 2019-06-17 DIAGNOSIS — M79641 Pain in right hand: Secondary | ICD-10-CM

## 2019-06-17 DIAGNOSIS — M545 Low back pain: Secondary | ICD-10-CM

## 2019-06-17 DIAGNOSIS — Z23 Encounter for immunization: Secondary | ICD-10-CM | POA: Diagnosis not present

## 2019-06-17 DIAGNOSIS — K3184 Gastroparesis: Secondary | ICD-10-CM

## 2019-06-17 DIAGNOSIS — E1043 Type 1 diabetes mellitus with diabetic autonomic (poly)neuropathy: Secondary | ICD-10-CM

## 2019-06-17 DIAGNOSIS — S63692A Other sprain of right middle finger, initial encounter: Secondary | ICD-10-CM | POA: Diagnosis not present

## 2019-06-17 DIAGNOSIS — M65342 Trigger finger, left ring finger: Secondary | ICD-10-CM

## 2019-06-17 DIAGNOSIS — J410 Simple chronic bronchitis: Secondary | ICD-10-CM

## 2019-06-17 DIAGNOSIS — G8929 Other chronic pain: Secondary | ICD-10-CM | POA: Diagnosis not present

## 2019-06-17 MED ORDER — GABAPENTIN 100 MG PO CAPS: ORAL_CAPSULE | ORAL | 2 refills | Status: DC

## 2019-06-17 MED ORDER — TRAMADOL HCL 50 MG PO TABS: 50.0000 mg | ORAL_TABLET | ORAL | 0 refills | Status: DC

## 2019-06-17 NOTE — Assessment & Plan Note (Signed)
No sign of any cortical defect on ultrasound, no angulation.  Buddy tape for the next 2 weeks.  Follow-up as needed

## 2019-06-17 NOTE — Progress Notes (Signed)
   Subjective:   Patient ID: Leslie Lane, female    DOB: 14-Mar-1956, 63 y.o.   MRN: [id number]  HPI The patient is a 63 YO female coming in for concerns about diabetes (taking her insulins, last HgA1c 6.5 and meeting with endo tomorrow, rare low sugars usually from taking an extra shot or missing meals, is able to recognize and eat to remedy) and tobacco abuse (with chronic cough, still smoking about 2 PPD, denies being able to make an attempt to quit at this time, smoking more than ever) and back pain (chronic, uses rare tramadol, past cocaine and opiate abuse, denies worsening pain, not exercising much due to covid-19).   Review of Systems  Constitutional: Negative.   HENT: Negative.   Eyes: Negative.   Respiratory: Negative for cough, chest tightness and shortness of breath.   Cardiovascular: Negative for chest pain, palpitations and leg swelling.  Gastrointestinal: Positive for abdominal pain. Negative for abdominal distention, constipation, diarrhea, nausea and vomiting.  Musculoskeletal: Positive for back pain and myalgias.  Skin: Negative.   Neurological: Negative.   Psychiatric/Behavioral: Negative.     Objective:  Physical Exam Constitutional:      Appearance: She is well-developed.  HENT:     Head: Normocephalic and atraumatic.  Cardiovascular:     Rate and Rhythm: Normal rate and regular rhythm.  Pulmonary:     Effort: Pulmonary effort is normal. No respiratory distress.     Breath sounds: Normal breath sounds. No wheezing or rales.  Abdominal:     General: Bowel sounds are normal. There is no distension.     Palpations: Abdomen is soft.     Tenderness: There is no abdominal tenderness. There is no rebound.     Comments: No rebound or guarding  Musculoskeletal:        General: Tenderness present.     Cervical back: Normal range of motion.  Skin:    General: Skin is warm and dry.  Neurological:     Mental Status: She is alert and oriented to person, place, and  time.     Coordination: Coordination normal.     Vitals:   06/17/19 1313  BP: 110/60  Pulse: 72  Temp: 98.3 F (36.8 C)  TempSrc: Oral  SpO2: 97%  Weight: 112 lb (50.8 kg)  Height: 5' 3.5" (1.613 m)    This visit occurred during the SARS-CoV-2 public health emergency.  Safety protocols were in place, including screening questions prior to the visit, additional usage of staff PPE, and extensive cleaning of exam room while observing appropriate contact time as indicated for disinfecting solutions.   Assessment & Plan:  Pneumonia 23 given at visit

## 2019-06-17 NOTE — Patient Instructions (Addendum)
  [address] 1st floor [place], [place] [address] Phone [phone]  Buddy tape finger If not better in 2 weeks give [place] a call Happy Holidays

## 2019-06-17 NOTE — Assessment & Plan Note (Signed)
Refill tramadol #15 no refills. This is not to be a chronic prescription. Refilled gabapentin which is to be her maintenance med for low back pain.

## 2019-06-17 NOTE — Patient Instructions (Addendum)
We do not need labs today. We have sent in the refills.   We have given you the pneumonia shot today.

## 2019-06-17 NOTE — Assessment & Plan Note (Signed)
Smoking more than normal, advised the risk/harm from this. She does not feel able to make an attempt to quit at this time.

## 2019-06-17 NOTE — Assessment & Plan Note (Signed)
Significant improvement at this time.  No need for repeat injection.  Discussed which activities doing which wants to avoid.  Patient will increase activity slowly.  Follow-up as needed

## 2019-06-17 NOTE — Assessment & Plan Note (Signed)
Having rare low sugars and taking her insulins. Asked her to clarify with her endocrine doctor about taking extra shots if this is appropriate. HgA1c 6.5 which is perhaps slightly over treated in the setting of lows.

## 2019-06-18 ENCOUNTER — Ambulatory Visit (INDEPENDENT_AMBULATORY_CARE_PROVIDER_SITE_OTHER): Payer: Medicare HMO | Admitting: Endocrinology

## 2019-06-18 ENCOUNTER — Other Ambulatory Visit: Payer: Self-pay

## 2019-06-18 ENCOUNTER — Encounter: Payer: Self-pay | Admitting: Endocrinology

## 2019-06-18 DIAGNOSIS — E1065 Type 1 diabetes mellitus with hyperglycemia: Secondary | ICD-10-CM | POA: Diagnosis not present

## 2019-06-18 DIAGNOSIS — E1029 Type 1 diabetes mellitus with other diabetic kidney complication: Secondary | ICD-10-CM

## 2019-06-18 DIAGNOSIS — E10649 Type 1 diabetes mellitus with hypoglycemia without coma: Secondary | ICD-10-CM

## 2019-06-18 DIAGNOSIS — R809 Proteinuria, unspecified: Secondary | ICD-10-CM | POA: Diagnosis not present

## 2019-06-18 NOTE — Addendum Note (Signed)
Addended by: [name] on: 06/18/2019 02:15 PM   Modules accepted: Level of Service

## 2019-06-18 NOTE — Progress Notes (Signed)
Patient ID: Leslie Lane, female   DOB: [birth year], 63 y.o.   MRN: [id number]  Reason for Appointment: Endocrinology follow-up   History of Present Illness   Today's office visit was provided via telemedicine using a telephone call to the patient Patient has been explained the limitations of evaluation and management by telemedicine and the availability of in person appointments.  The patient understood the limitations and agreed to proceed. Patient also understood that the telehealth visit is billable. . Location of the patient: Home . Location of the provider: Office Only the patient and myself were participating in the encounter   Diagnosis: Type 1 DIABETES MELITUS, diagnosed 1967      She has had labile blood sugar control over the last several years even though A1c has been usually around 7% She has had less lability and hypoglycemia with taking b.i.d. Lantus compared once a day She has been very sensitive to fast acting insulin and frequently does not require mealtime coverage She cannot tolerate Toujeo because of reported episode of headache, bloating and nausea with the first dose  RECENT history:  Insulin regimen: Tresiba 8 units daily, Novolog 1-2 units up to 4 times a day   Her A1c has been mostly around 7%, last 6.8  Current blood sugar patterns and problems identified:  She has fairly good fasting readings without overnight hypoglycemia  However she is still getting sporadic low sugars in the afternoon or the evenings  As before she is sometimes more active with walking and does not take a snack before going out and waits till her sugar is low to treated  Appears that at least one occasion she may have taken NovoLog for a mildly increased glucose of 175 causing her sugars to go low  However has not checked readings consistently after supper the last 4 days  With reducing her basal insulin down to 8 units again she is having overall less tendency to  hypoglycemia  She usually has difficulty remembering her instructions on her visits  She will again have snacks or and balanced meals depending on her stress level  Previously had not wanted to continue her Dexcom sensor partly because of difficulty using it and also out-of-pocket expense   HYPOGLYCEMIA: Lowest blood sugar 29 recently  May not have symptoms with low blood sugars and does not have early warning symptoms. Gets confused frequently and she depends on her mother to recognize low sugars and treat them.  Her mother knows how to give Glucagon injection   She will treat her low blood sugars usually with juice, does carry glucose tablets with her      DIET: She is trying to get protein with eggs at breakfast Meal times: Variable.Marland Kitchen  She will eat more when she is anxious  Monitors blood glucose:  At least 4-5 times a day.    Glucometer:Accu-Chek Aviva   PRE-MEAL Fasting Lunch Dinner Bedtime Overall  Glucose range:  77-299  76-138- 75-175  53-157   Mean/median:     ?   POST-MEAL PC Breakfast PC Lunch PC Dinner  Glucose range:   35  29-52  Mean/median:        Previous readings:  PRE-MEAL Fasting Lunch Dinner Bedtime Overall  Glucose range:  85-349   36-269  33-205  33-349  Mean/median:  178  118  130  103  131       Wt Readings from Last 3 Encounters:  06/17/19 112 lb (50.8 kg)  06/17/19 112  lb (50.8 kg)  04/02/19 110 lb (49.9 kg)    Lab Results  Component Value Date   HGBA1C 6.5 06/16/2019   HGBA1C 6.8 (H) 01/20/2019   HGBA1C 6.9 (H) 10/22/2018   Lab Results  Component Value Date   MICROALBUR 273.4 (H) 01/20/2019   LDLCALC 89 06/16/2019   CREATININE 0.80 06/16/2019        Allergies as of 06/18/2019      Reactions   Penicillins Anaphylaxis   Sulfonamide Derivatives Anaphylaxis      Medication List       Accurate as of June 18, 2019 10:29 AM. If you have any questions, ask your nurse or doctor.        Accu-Chek Aviva Plus test  strip Generic drug: glucose blood TEST BLOOD SUGAR FOUR TIMES DAILY   Accu-Chek Aviva Plus w/Device Kit USE AS DIRECTED   Accu-Chek Aviva Soln USE AS DIRECTED   Accu-Chek Softclix Lancets lancets Use as instructed to check blood sugar 4 times per day dx code E10.65 What changed: additional instructions   aspirin EC 81 MG tablet Take 81 mg by mouth every morning.   B-D SINGLE USE SWABS REGULAR Pads USE FOUR TIMES DAILY   B-D ULTRAFINE III SHORT PEN 31G X 8 MM Misc Generic drug: Insulin Pen Needle USE 1 PEN NEEDLE TO INJECT FOUR TIMES DAILY   diclofenac sodium 1 % Gel Commonly known as: VOLTAREN APPLY 2 GRAMS EXTERNALLY TO THE AFFECTED AREA FOUR TIMES DAILY   donepezil 10 MG tablet Commonly known as: ARICEPT Take 1/2 tablet daily for 2 weeks, then increase to 1 tablet daily and continue   Fish Oil 1000 MG Caps Take 1 capsule by mouth daily.   gabapentin 100 MG capsule Commonly known as: NEURONTIN TAKE 1 TO 2 CAPSULES(100 TO 200 MG) BY MOUTH TWICE DAILY   glucagon 1 MG Solr injection Commonly known as: GlucaGen HypoKit Inject 1 mg into the skin once as needed for up to 1 dose for low blood sugar.   Gvoke HypoPen 2-Pack 1 MG/0.2ML Soaj Generic drug: Glucagon INJECT 1 MG INTO THE SKIN AS NEEDED FOR HYPO   insulin aspart 100 UNIT/ML FlexPen Commonly known as: NovoLOG FlexPen INJECT 1-3 UNITS UNDER THE SKIN UP TO 4 TIMES DAILY.   lamoTRIgine 100 MG tablet Commonly known as: LAMICTAL Take 1 tablet (100 mg total) by mouth 2 (two) times daily.   lidocaine 5 % Commonly known as: LIDODERM APPLY 1 PATCH ONTO THE SKIN DAILY. REMOVE AND DISCARD PATCH WITHIN 12 HOURS OR AS DIRECTED BY MD   lipase/protease/amylase 36000 UNITS Cpep capsule Commonly known as: Creon Take 2 capsules (72,000 Units total) by mouth 3 (three) times daily before meals.   loratadine 10 MG tablet Commonly known as: CLARITIN Take 10 mg by mouth as needed for allergies.   lovastatin 40 MG  tablet Commonly known as: MEVACOR TAKE 1 TABLET AT BEDTIME   Magnesium 500 MG Tabs Take 1 tablet by mouth as needed.   metoCLOPramide 5 MG tablet Commonly known as: REGLAN TAKE 1 TABLET THREE TIMES DAILY BEFORE MEALS AS NEEDED   multivitamin tablet Take 1 tablet by mouth daily. Reported on 11/22/2015   omeprazole 40 MG capsule Commonly known as: PRILOSEC TAKE 1 CAPSULE(40 MG) BY MOUTH DAILY   traMADol 50 MG tablet Commonly known as: ULTRAM Take 1 tablet (50 mg total) by mouth every other day.   traZODone 100 MG tablet Commonly known as: DESYREL Take 4 tablets (400 mg total) by  mouth at bedtime.   Tyler Aas FlexTouch 100 UNIT/ML Sopn FlexTouch Pen Generic drug: insulin degludec INJECT 15 UNITS INTO THE SKIN DAILY.   vitamin B-12 1000 MCG tablet Commonly known as: CYANOCOBALAMIN Take 1 tablet by mouth daily.   vitamin E 1000 UNIT capsule Generic drug: vitamin E Take 1,000 Units by mouth daily.       Allergies:  Allergies  Allergen Reactions  . Penicillins Anaphylaxis  . Sulfonamide Derivatives Anaphylaxis    Past Medical History:  Diagnosis Date  . ALLERGIC RHINITIS   . Allergy   . Anemia   . Anxiety   . Arthritis   . BIPOLAR AFFECTIVE DISORDER   . Cataract   . DEPRESSION   . Diabetes mellitus type I ([place])   . DIABETES MELLITUS, TYPE II    follows with endo  . Fibromyalgia   . GERD   . HEPATITIS B   . HYPERLIPIDEMIA   . LOW BACK PAIN, CHRONIC   . MIGRAINE HEADACHE   . NARCOTIC ABUSE    hx of  . NECK PAIN, CHRONIC   . Neuropathy   . Seizures ([place])    pt states, "if my blood sugar drops to the 20's, I convulse.  It hasn't happened in a long time."  . SMOKER     Past Surgical History:  Procedure Laterality Date  . CESAREAN SECTION    . COLONOSCOPY    . TUBAL LIGATION    . UPPER GASTROINTESTINAL ENDOSCOPY      Family History  Problem Relation Age of Onset  . Arthritis Mother   . Colon cancer Mother        ? age of dx  . Arthritis Father    . Kidney disease Father   . Kidney cancer Father   . Heart attack Brother   . Bladder Cancer Sister   . Hyperlipidemia Other   . Kidney cancer Paternal Aunt   . Anxiety disorder Neg Hx   . Bipolar disorder Neg Hx   . Depression Neg Hx   . Breast cancer Neg Hx   . Esophageal cancer Neg Hx   . Stomach cancer Neg Hx   . Rectal cancer Neg Hx     Social History:  reports that she has been smoking cigarettes. She has a 80.00 pack-year smoking history. She has never used smokeless tobacco. She reports current alcohol use. She reports that she does not use drugs.  Review of Systems:   Has probable gastroparesis treated with Reglan as needed  NEUROPATHY: She has burning in her legs and feet at times treated with gabapentin   DEPRESSION: She has had long-standing depression and anxiety on long-term treatment   HYPERLIPIDEMIA: The lipid abnormality consists of elevated LDL   Is  on lovastatin 40 mg with  good control   Lab Results  Component Value Date   CHOL 178 06/16/2019   HDL 80.80 06/16/2019   LDLCALC 89 06/16/2019   LDLDIRECT 101.7 03/26/2012   TRIG 45.0 06/16/2019   CHOLHDL 2 06/16/2019    Memory difficulties: She is on Aricept by the neurologist, is taking 10 mg now   Examination:   There were no vitals taken for this visit.  There is no height or weight on file to calculate BMI.    ASSESSMENT/ PLAN:   Diabetes type 1 with poor control  See history of present illness for detailed discussion of current insulin, blood sugar patterns, problems identified  A1c has been under 7%  Although her blood  sugars are looking fairly good in the last 1-2 weeks or so from her history she is still having sporadic hypoglycemia with at least 3 significant episodes Hypoglycemia is again related to periodic increased physical activity for which she does not compensate for snacks proactively She may be also occasionally taking additional NovoLog insulin despite being told not to  take it blood sugars are below 200 As before she needs only minimal or no mealtime insulin coverage Hyperglycemia is very infrequent recently  Again discussed causes of hyperglycemia and hypoglycemia Discussed prevention of hypoglycemia especially with increased activity and to avoid extra NovoLog  Since the freestyle libre 2 will alert her to low sugars she may be a good candidate for this and this hopefully will be more affordable and easier to use than the Dexcom which she did not continue As before she is unlikely to be able to handle her insulin pump  Tyler Aas will be continued at 8 units  Hyperlipidemia: LDL is well controlled and she will continue atorvastatin unchanged  Microalbuminuria: We will follow-up on the next visit  Follow-up in 3 months    There are no Patient Instructions on file for this visit.     [name] 06/18/2019, 10:29 AM   Duration of telephone encounter =11 minutes

## 2019-07-02 ENCOUNTER — Ambulatory Visit
Admission: RE | Admit: 2019-07-02 | Discharge: 2019-07-02 | Disposition: A | Discharge status: Home / Self Care | Payer: Medicare HMO | Source: Ambulatory Visit | Attending: Internal Medicine | Admitting: Internal Medicine

## 2019-07-02 ENCOUNTER — Other Ambulatory Visit: Payer: Self-pay

## 2019-07-02 DIAGNOSIS — Z1231 Encounter for screening mammogram for malignant neoplasm of breast: Secondary | ICD-10-CM

## 2019-07-10 ENCOUNTER — Other Ambulatory Visit: Payer: Self-pay | Admitting: Endocrinology

## 2019-07-14 ENCOUNTER — Other Ambulatory Visit: Payer: Self-pay | Admitting: Internal Medicine

## 2019-07-16 ENCOUNTER — Other Ambulatory Visit: Payer: Self-pay | Admitting: Internal Medicine

## 2019-07-16 ENCOUNTER — Other Ambulatory Visit: Payer: Self-pay | Admitting: Endocrinology

## 2019-07-31 ENCOUNTER — Telehealth (INDEPENDENT_AMBULATORY_CARE_PROVIDER_SITE_OTHER): Payer: Medicare Other | Admitting: Neurology

## 2019-07-31 ENCOUNTER — Other Ambulatory Visit: Payer: Self-pay

## 2019-07-31 DIAGNOSIS — G3184 Mild cognitive impairment, so stated: Secondary | ICD-10-CM

## 2019-07-31 DIAGNOSIS — R400 Somnolence: Secondary | ICD-10-CM

## 2019-07-31 MED ORDER — DONEPEZIL HCL 10 MG PO TABS: ORAL_TABLET | ORAL | 3 refills | Status: DC

## 2019-07-31 NOTE — Addendum Note (Signed)
Addended by: Jake Seats on: 07/31/2019 11:06 AM   Modules accepted: Orders

## 2019-07-31 NOTE — Progress Notes (Signed)
Telephone (Audio) Visit The purpose of this telephone visit is to provide medical care while limiting exposure to the novel coronavirus.    Consent was obtained for telephone visit:  Yes.   Answered questions that patient had about telehealth interaction:  Yes.   I discussed the limitations, risks, security and privacy concerns of performing an evaluation and management service by telephone. I also discussed with the patient that there may be a patient responsible charge related to this service. The patient expressed understanding and agreed to proceed.  Pt location: Home Physician Location: office Name of referring provider:  [name], * I connected with .Leslie Lane at patients initiation/request on 07/31/2019 at 10:00 AM EST by telephone and verified that I am speaking with the correct person using two identifiers.  Pt MRN:  [id number] Pt DOB:  09-12-1955   History of Present Illness:  The patient had a telephone visit on 07/31/2019. She was last seen in the neurology clinic 7 months ago for memory loss. MOCA score 14/30. I personally reviewed MRI brain without contrast done 01/2019 which did not show any acute changes. There was mild diffuse atrophy, mild chronic microvascular disease. She was started on Donepezil 10mg  daily which she is tolerating without side effects. She states "I still can't remember things." She can't remember things from the past. She lives with her mother. She manages her own medications and occasionally forgets if she took it already. She states she has several pillboxes at home but does not use them. She denies getting lost driving familiar roads. She has a guardian managing finances. She denies leaving the stove on. She denies any headaches, dizziness, focal numbness/tingling/weakness, no falls. She reports her sleep is good with Trazodone, but she is "not a morning person." She feels miserable in the morning and has to force herself to get out of bed.  She has daytime drowsiness. She states her mood is "alright."    History on Initial Assessment 12/18/2018: This is a 64 year old woman with a history of type I DM, hyperlipidemia, fibromyalgia, migraines, major depressive disorder, GAD, rule out bipolar disorder, presenting for evaluation of memory loss. She is alone in the office with no family to corroborate history. She states memory changes started after she was in the hospital in a diabetic coma at age 64. She states she has not noticed any changes since then, memory has been about the same since then. She initially could not drive a car or remember a movie she had watched. She acquired a guardian to manage her finances. She lives with her mother. She has been told she repeats herself and says the same thing every morning. She used to leave the stove on, but has been more vigilant. She has been driving and has gotten lost driving in unfamiliar roads. She manages her medications independently. She states she does not like math and was not good in school. She has reported her memory concerns to her psychiatrist and reported that she cannot focus on anything. She is easily irritable. No paranoia or hallucinations. Sleep is good with Trazodone. No family history of dementia. No history of significant head injuries. She used to drink heavily when younger, then cut down to a beer every now and then after her diabetic coma.   She has headaches that improve when she is not smoking. She has a history of migraines and has mild bitemporal headaches, taking daily magnesium. She has chronic neck and back pain and neuropathy in  her feet. No dizziness, vision changes, focal weakness, bowel/bladder dysfunction, anosmia, or tremors.     Observations/Objective:  Limited due to nature of phone visit. Patient is awake, alert, oriented x 3. No dysarthria noted. Unclear if due to nature of phone visit, patient was reporting hearing "clicks" during the visit, but she some  questions needed some repetition and clarification.  Montreal Cognitive Assessment Blind 07/30/2019   Attention: Read list of digits (0/2) 1   Attention: Read list of letters (0/1) 1   Attention: Serial 7 subtraction starting at 100 (0/3) 1   Language: Repeat phrase (0/2) 0   Language : Fluency (0/1) 0   Abstraction (0/2) 2   Delayed Recall (0/5) 4   Orientation (0/6) 6   Total 15   Adjusted Score (based on education) 16     Assessment and Plan:   This is a pleasant 64 yo RH woman with a history of type I DM, hyperlipidemia, fibromyalgia, migraines, major depressive disorder, GAD, rule out bipolar disorder, with memory loss. She reports memory changes started after a diabetic coma at age 64. She had reported difficulties with complex tasks and has a guardian to manage her finances. MOCA blind (done over phone) today 16/22 indicating Mild Cognitive Impairment, possible mild dementia. MRI brain showed mild diffuse atrophy, mild chronic microvascular disease. Continue Donepezil 10mg  daily. She reports daytime drowsiness and unrefreshing sleep, a home sleep study will be ordered to assess for sleep apnea, which can also cause cognitive difficulties. Continue control of vascular risk factors. Continue to monitor driving. Follow-up in 6 months, she knows to call for any changes.   Follow Up Instructions:   -I discussed the assessment and treatment plan with the patient. The patient was provided an opportunity to ask questions and all were answered. The patient agreed with the plan and demonstrated an understanding of the instructions.   The patient was advised to call back or seek an in-person evaluation if the symptoms worsen or if the condition fails to improve as anticipated.    Total Time spent in visit with the patient was:  21:49 minutes, of which 100% of the time was spent in counseling and/or coordinating care on the above.   Pt understands and agrees with the plan of care outlined.      Cameron Sprang, MD

## 2019-08-11 DIAGNOSIS — H52223 Regular astigmatism, bilateral: Secondary | ICD-10-CM | POA: Diagnosis not present

## 2019-08-11 DIAGNOSIS — E109 Type 1 diabetes mellitus without complications: Secondary | ICD-10-CM | POA: Diagnosis not present

## 2019-08-11 LAB — HM DIABETES EYE EXAM

## 2019-08-28 ENCOUNTER — Encounter (HOSPITAL_COMMUNITY): Payer: Self-pay | Admitting: Psychiatry

## 2019-08-28 ENCOUNTER — Other Ambulatory Visit: Payer: Self-pay

## 2019-08-28 ENCOUNTER — Ambulatory Visit (INDEPENDENT_AMBULATORY_CARE_PROVIDER_SITE_OTHER): Payer: Medicare Other | Admitting: Psychiatry

## 2019-08-28 DIAGNOSIS — F4010 Social phobia, unspecified: Secondary | ICD-10-CM

## 2019-08-28 DIAGNOSIS — F332 Major depressive disorder, recurrent severe without psychotic features: Secondary | ICD-10-CM

## 2019-08-28 DIAGNOSIS — G4701 Insomnia due to medical condition: Secondary | ICD-10-CM | POA: Diagnosis not present

## 2019-08-28 MED ORDER — TRAZODONE HCL 100 MG PO TABS: 400.0000 mg | ORAL_TABLET | Freq: Every day | ORAL | 0 refills | Status: DC

## 2019-08-28 MED ORDER — LAMOTRIGINE 100 MG PO TABS: 100.0000 mg | ORAL_TABLET | Freq: Two times a day (BID) | ORAL | 0 refills | Status: DC

## 2019-08-28 NOTE — Progress Notes (Signed)
Virtual Visit via Telephone Note  I connected with PAULEAN LAMOTTE on 08/28/19 at  1:30 PM EST by telephone and verified that I am speaking with the correct person using two identifiers.  Location: Patient: home Provider: office   I discussed the limitations, risks, security and privacy concerns of performing an evaluation and management service by telephone and the availability of in person appointments. I also discussed with the patient that there may be a patient responsible charge related to this service. The patient expressed understanding and agreed to proceed.   History of Present Illness: Christene thinks her poor energy is related to her blood sugars. She forces herself to get up and do the things she needs to do daily. Dalena admits that she doesn't want to go out. She is watching what she is eating and is taking insulin. It is very stressful and anxiety provoking. She is working with her PCP 2x/year. Her sleep is good. Her depression is overall unchanged. She is tired of feeling this way. Her family is actively involved in her daily life. She is not going out to see her friends. Kimbelry can not concentrate and does not retain information well. Sametria denies SI/HI.    Observations/Objective: General Appearance: unable to assess  Eye Contact:  unable to assess  Speech:  Clear and Coherent and Normal Rate  Volume:  Normal  Mood:  Anxious and Depressed  Affect:  Congruent  Thought Process:  Coherent and Descriptions of Associations: Circumstantial  Orientation:  Full (Time, Place, and Person)  Thought Content:  Logical  Suicidal Thoughts:  No  Homicidal Thoughts:  No  Memory:  Immediate;   Fair  Judgement:  Fair  Insight:  Fair  Psychomotor Activity:  unable to assess  Concentration:  Concentration: Fair  Recall:  [employer] of Knowledge:  Fair  Language:  Fair  Akathisia:  unable to assess  Handed:  Right  AIMS (if indicated):     Assets:  Communication Skills Desire for  Improvement Financial Resources/Insurance Housing Transportation  ADL's:  Unable to assess  Cognition:  WNL  Sleep:         I reviewed the information below on 08/28/2019 and have updated it Assessment and Plan:  MDD-recurrent, severe without psychotic features; GAD; Social anxiety d/o; Insomnia   Status of current symptoms: unchanged   She is unable to afford [place]   Previous med trial were ineffective   Her finances are limited and she is on a strict budget.   Lamictal 100mg  po BID for MDD   Trazodone 400mg  po qHS for MDD and insomnia   Pt is unwilling to try new meds due to fear of SE. "I guess I will just stay on what I am on".     Follow Up Instructions: In 2-3 months or sooner if needed   I discussed the assessment and treatment plan with the patient. The patient was provided an opportunity to ask questions and all were answered. The patient agreed with the plan and demonstrated an understanding of the instructions.   The patient was advised to call back or seek an in-person evaluation if the symptoms worsen or if the condition fails to improve as anticipated.  I provided 30 minutes of non-face-to-face time during this encounter.   Charlcie Cradle, MD

## 2019-09-02 ENCOUNTER — Telehealth: Payer: Self-pay | Admitting: Internal Medicine

## 2019-09-02 NOTE — Chronic Care Management (AMB) (Signed)
  Chronic Care Management   Note  09/02/2019 Name: Leslie Lane MRN: [id number] DOB: 06-Apr-1956  Leslie Lane is a 64 y.o. year old female who is a primary care patient of [name], MD. I reached out to [name] by phone today in response to a referral sent by [name] PCP, [name], MD.   [name] was given information about Chronic Care Management services today including:  1. CCM service includes personalized support from designated clinical staff supervised by her physician, including individualized plan of care and coordination with other care providers 2. 24/7 contact phone numbers for assistance for urgent and routine care needs. 3. Service will only be billed when office clinical staff spend 20 minutes or more in a month to coordinate care. 4. Only one practitioner may furnish and bill the service in a calendar month. 5. The patient may stop CCM services at any time (effective at the end of the month) by phone call to the office staff. 6. The patient will be responsible for cost sharing (co-pay) of up to 20% of the service fee (after annual deductible is met).  Patient agreed to services and verbal consent obtained.   Follow up plan:   [name] [employer] Scheduler

## 2019-09-11 ENCOUNTER — Other Ambulatory Visit: Payer: Self-pay

## 2019-09-11 ENCOUNTER — Telehealth: Payer: Self-pay | Admitting: Endocrinology

## 2019-09-11 MED ORDER — ACCU-CHEK AVIVA PLUS VI STRP: ORAL_STRIP | 2 refills | Status: DC

## 2019-09-11 NOTE — Telephone Encounter (Signed)
Rx sent 

## 2019-09-11 NOTE — Telephone Encounter (Signed)
MEDICATION: ACCU-CHEK AVIVA PLUS test strip  PHARMACY:   [facility] [id number] - [place], [place] Phone:  [phone]  Fax:  [phone]      IS THIS A 90 DAY SUPPLY : Yes  IS PATIENT OUT OF MEDICATION: No  IF NOT; HOW MUCH IS LEFT: Approx. 2 weeks  LAST APPOINTMENT DATE: @2 /22/2021  NEXT APPOINTMENT DATE:@3 /16/2021  DO WE HAVE YOUR PERMISSION TO LEAVE A DETAILED MESSAGE: Yes  OTHER COMMENTS:    **Let patient know to contact pharmacy at the end of the day to make sure medication is ready. **  ** Please notify patient to allow 48-72 hours to process**  **Encourage patient to contact the pharmacy for refills or they can request refills through [facility]**

## 2019-09-16 ENCOUNTER — Other Ambulatory Visit: Payer: Self-pay

## 2019-09-16 ENCOUNTER — Other Ambulatory Visit (INDEPENDENT_AMBULATORY_CARE_PROVIDER_SITE_OTHER): Payer: Medicare Other

## 2019-09-16 ENCOUNTER — Ambulatory Visit: Payer: Medicare HMO | Admitting: Endocrinology

## 2019-09-16 DIAGNOSIS — E1065 Type 1 diabetes mellitus with hyperglycemia: Secondary | ICD-10-CM

## 2019-09-16 LAB — COMPREHENSIVE METABOLIC PANEL
ALT: 17 U/L (ref 0–35)
AST: 21 U/L (ref 0–37)
Albumin: 4 g/dL (ref 3.5–5.2)
Alkaline Phosphatase: 49 U/L (ref 39–117)
BUN: 18 mg/dL (ref 6–23)
CO2: 32 mEq/L (ref 19–32)
Calcium: 9.3 mg/dL (ref 8.4–10.5)
Chloride: 98 mEq/L (ref 96–112)
Creatinine, Ser: 0.72 mg/dL (ref 0.40–1.20)
GFR: 81.19 mL/min (ref >60.00)
Glucose, Bld: 153 mg/dL — ABNORMAL HIGH (ref 70–99)
Potassium: 3.5 mEq/L (ref 3.5–5.1)
Sodium: 136 mEq/L (ref 135–145)
Total Bilirubin: 0.5 mg/dL (ref 0.2–1.2)
Total Protein: 6.6 g/dL (ref 6.0–8.3)

## 2019-09-16 LAB — MICROALBUMIN / CREATININE URINE RATIO
Creatinine,U: 19.5 mg/dL
Microalb Creat Ratio: 9.8 mg/g (ref 0.0–30.0)
Microalb, Ur: 1.9 mg/dL (ref 0.0–1.9)

## 2019-09-16 LAB — HEMOGLOBIN A1C: Hgb A1c MFr Bld: 6.6 % — ABNORMAL HIGH (ref 4.6–6.5)

## 2019-09-17 ENCOUNTER — Other Ambulatory Visit: Payer: Self-pay

## 2019-09-18 ENCOUNTER — Ambulatory Visit: Payer: Medicare Other | Admitting: Pharmacist

## 2019-09-18 ENCOUNTER — Encounter: Payer: Self-pay | Admitting: Endocrinology

## 2019-09-18 ENCOUNTER — Ambulatory Visit (INDEPENDENT_AMBULATORY_CARE_PROVIDER_SITE_OTHER): Payer: Medicare Other | Admitting: Endocrinology

## 2019-09-18 VITALS — BP 120/80 | HR 76 | Ht 64.0 in | Wt 111.4 lb

## 2019-09-18 DIAGNOSIS — R413 Other amnesia: Secondary | ICD-10-CM

## 2019-09-18 DIAGNOSIS — E1065 Type 1 diabetes mellitus with hyperglycemia: Secondary | ICD-10-CM

## 2019-09-18 DIAGNOSIS — F332 Major depressive disorder, recurrent severe without psychotic features: Secondary | ICD-10-CM

## 2019-09-18 DIAGNOSIS — E1043 Type 1 diabetes mellitus with diabetic autonomic (poly)neuropathy: Secondary | ICD-10-CM

## 2019-09-18 DIAGNOSIS — E1042 Type 1 diabetes mellitus with diabetic polyneuropathy: Secondary | ICD-10-CM

## 2019-09-18 DIAGNOSIS — E1069 Type 1 diabetes mellitus with other specified complication: Secondary | ICD-10-CM

## 2019-09-18 DIAGNOSIS — K219 Gastro-esophageal reflux disease without esophagitis: Secondary | ICD-10-CM

## 2019-09-18 NOTE — Chronic Care Management (AMB) (Signed)
Chronic Care Management Pharmacy  Name: Leslie Lane  MRN: [id number] DOB: 22-Oct-1955   Chief Complaint/ HPI  Leslie Lane,  64 y.o. , female presents for their Initial CCM visit with the clinical pharmacist via telephone due to COVID-19 Pandemic.  PCP : [name], MD  Their chronic conditions include: T1DM w/ gastroparesis, depression/anxiety, insomnia, GERD, fibromyalgia  Lives with mother, she gives glucagon shot if needed. Brother and 2 sisters, sister visits about once a week.    Fatigue is biggest problem.    Office Visits:  06/17/19 Dr Sharlet Salina OV: refilled tramadol, but this will not be chronic rx. Gabapentin is maintenance tx for low back pain. Pt still smoking, not ready to quit.  Consult Visit: 09/18/19 Dr Dwyane Dee (endocrine): frequent low sugars, down to 40s, with hypoglycemia unawareness. Low sugar usually related to increased activity in afternoons, she still takes Novolog at lunch. Recs: move [place] to dinnertime, cut back on high-carb snacks at bedtime, eliminate mealtime coverage at lunch, may not need at dinnertime unless significant carb load. Pt to call re: Freestyle Libre 2.   08/28/19 Dr Doyne Keel (psych): pt unwilling to try new meds d/t fear of SE. Finances limited, strict budget.   07/31/19 Dr Anselm Pancoast (neurology): memory loss, started after diabetic coma at 89. [place] 14/30. MRI 01/2019 negative acute changes. Started donepezil and pt reports still having memory loss. Several pillboxes at home but does not use, sometimes forgets meds. Ordered home sleep study.   06/18/19 Dr Dwyane Dee (endocrine): labile BG although A1c is controlled. Cannot tolerate Toujeo d/t headache, bloating, nausea. Significant issues with hypoglycemia. Thinking about Freestyle Libre 2 due to low alert capability. Would not be good candidate for insulin pump.  06/17/19 Dr Tamala Julian (sports medicine): trigger finger, receiving prn injections.  05/15/19 Dr Doyne Keel (psych): discussed other  med options, pt declined all options d/t cost or fear of SE, though about low-dose Ritalin but pt declined, pt stated "I guess I will just stay feeling like I am feeling".   Medications: Outpatient Encounter Medications as of 09/18/2019  Medication Sig  . ACCU-CHEK SOFTCLIX LANCETS lancets Use as instructed to check blood sugar 4 times per day dx code E10.65 (Patient taking differently: Use as instructed to check blood sugar 8 times per day dx code E10.65)  . Alcohol Swabs (B-D SINGLE USE SWABS REGULAR) PADS USE FOUR TIMES DAILY  . aspirin EC 81 MG tablet Take 81 mg by mouth every morning.  . B-D ULTRAFINE III SHORT PEN 31G X 8 MM MISC USE 1 PEN NEEDLE TO INJECT FOUR TIMES DAILY  . Blood Glucose Calibration (ACCU-CHEK AVIVA) SOLN USE AS DIRECTED  . Blood Glucose Monitoring Suppl (ACCU-CHEK AVIVA PLUS) w/Device KIT USE AS DIRECTED  . diclofenac sodium (VOLTAREN) 1 % GEL APPLY 2 GRAMS EXTERNALLY TO THE AFFECTED AREA FOUR TIMES DAILY  . diclofenac Sodium (VOLTAREN) 1 % GEL APPLY 2 GRAMS EXTERNALLY TO THE AFFECTED AREA FOUR TIMES DAILY  . donepezil (ARICEPT) 10 MG tablet Take 1 tablet daily  . gabapentin (NEURONTIN) 100 MG capsule TAKE 1 TO 2 CAPSULES(100 TO 200 MG) BY MOUTH TWICE DAILY  . glucagon (GLUCAGEN HYPOKIT) 1 MG SOLR injection Inject 1 mg into the skin once as needed for up to 1 dose for low blood sugar.  Marland Kitchen glucose blood (ACCU-CHEK AVIVA PLUS) test strip TEST BLOOD SUGAR FOUR TIMES DAILY  . GVOKE HYPOPEN 2-PACK 1 MG/0.2ML SOAJ INJECT 1 MG INTO THE SKIN AS NEEDED FOR HYPO  . lamoTRIgine (  LAMICTAL) 100 MG tablet Take 1 tablet (100 mg total) by mouth 2 (two) times daily.  Marland Kitchen lidocaine (LIDODERM) 5 % APPLY 1 PATCH ONTO THE SKIN DAILY. REMOVE AND DISCARD PATCH WITHIN 12 HOURS OR AS DIRECTED BY MD  . lipase/protease/amylase (CREON) 36000 UNITS CPEP capsule Take 2 capsules (72,000 Units total) by mouth 3 (three) times daily before meals.  Marland Kitchen loratadine (CLARITIN) 10 MG tablet Take 10 mg by mouth as  needed for allergies.  Marland Kitchen lovastatin (MEVACOR) 40 MG tablet TAKE 1 TABLET AT BEDTIME  . Magnesium 500 MG TABS Take 1 tablet by mouth as needed.  . metoCLOPramide (REGLAN) 5 MG tablet TAKE 1 TABLET THREE TIMES DAILY BEFORE MEALS AS NEEDED  . Multiple Vitamin (MULTIVITAMIN) tablet Take 1 tablet by mouth daily. Reported on 11/22/2015  . NON FORMULARY [place] abstract  . NOVOLOG FLEXPEN 100 UNIT/ML FlexPen INJECT 1 TO 3 UNITS UNDER THE SKIN UP TO 4 TIMES DAILY (DISCARD AND BEGIN A NEW PEN AFTER 28 DAYS)  . Omega-3 Fatty Acids (FISH OIL) 1000 MG CAPS Take 1 capsule by mouth daily.  Marland Kitchen omeprazole (PRILOSEC) 40 MG capsule TAKE 1 CAPSULE(40 MG) BY MOUTH DAILY  . traMADol (ULTRAM) 50 MG tablet Take 1 tablet (50 mg total) by mouth every other day.  . traZODone (DESYREL) 100 MG tablet Take 4 tablets (400 mg total) by mouth at bedtime.  . TRESIBA FLEXTOUCH 100 UNIT/ML SOPN FlexTouch Pen INJECT 15 UNITS INTO THE SKIN DAILY.  . vitamin B-12 (CYANOCOBALAMIN) 1000 MCG tablet Take 1 tablet by mouth daily.  . vitamin E (VITAMIN E) 1000 UNIT capsule Take 1,000 Units by mouth daily.     No facility-administered encounter medications on file as of 09/18/2019.     Current Diagnosis/Assessment:  Goals Addressed   None     Diabetes   Recent Relevant Labs: Lab Results  Component Value Date/Time   HGBA1C 6.6 (H) 09/16/2019 10:28 AM   HGBA1C 6.5 06/16/2019 02:38 PM   MICROALBUR 1.9 09/16/2019 10:28 AM   MICROALBUR 273.4 (H) 01/20/2019 11:27 AM     Checking BG: 4-8 times daily. Pt follows with endocrine, had appt this AM.  Patient has failed these meds in past: Lantus, Toujeo,  Patient is currently controlled on the following medications: Tresiba 8 units daily, Novolog 1-3 units up to QID, glucagon injection  Last diabetic eye exam:  Lab Results  Component Value Date/Time   HMDIABEYEEXA No Retinopathy 08/11/2019 12:00 AM    Last diabetic foot exam: No results found for: HMDIABFOOTEX   We  discussed: diet and exercise extensively and how to recognize and treat signs of hypoglycemia. Pt does have hypoglycemia fairly frequently, working with endocrine to improve. She called [facility]estyle [facility]re this morning to begin process for obtaining CGM.  For exercise she mainly walks around grocery store and cleans her house.   Plan  Continue current medications and control with diet and exercise    Hyperlipidemia   Lipid Panel     Component Value Date/Time   CHOL 178 06/16/2019 1438   TRIG 45.0 06/16/2019 1438   HDL 80.80 06/16/2019 1438   CHOLHDL 2 06/16/2019 1438   VLDL 9.0 06/16/2019 1438   [place] 89 06/16/2019 1438     The 10-year ASCVD risk score Mikey Bussing DC Jr., et al., 2013) is: 11.5%   Values used to calculate the score:     Age: 23 years     Sex: Female     Is Non-Hispanic African American: No  Diabetic: Yes     Tobacco smoker: Yes     Systolic Blood Pressure: 961 mmHg     Is BP treated: No     HDL Cholesterol: 80.8 mg/dL     Total Cholesterol: 178 mg/dL   Patient has failed these meds in past: simvastatin Patient is currently controlled on the following medications: lovastatin 40 mg HS, aspirin 81 mg daily, fish oil OTC  We discussed:  diet and exercise extensively, discussed cholesterol's role in ASCVD, types of cholesterol and her cholesterol goals. Discussed types of food that increase cholesterol, pt denies eating fried foods.  Plan  Continue current medications and control with diet and exercise   GERD/gastroparesis   Patient has failed these meds in past: n/a Patient is currently controlled on the following medications: omeprazole 40 mg daily, metoclopramide 5 mg TID prn  We discussed:  Pt denies heartburn while taking PPI, when she misses a dose she endorses severe heartburn.   Plan  Continue current medications    Depression/Anxiety   Patient has failed these meds in past: buspirone, escitalopram, hydroxyzine, paroxetine, venlafaxine Patient  is currently uncontrolled on the following medications: lamotrigine 100 mg BID, trazodone 100 mg HS, donepezil 10 mg daily  We discussed: pt reports lamotrigine worked very well for her in the beginning, but lately she feels its effect is wearing off. She feels fatigued and unmotivated to the leave the house. Pt is adamant that she will not take meds that may cause weight gain. Discussed bupropion as augmented therapy, she reports she has been on it before and may be interested in trying again, she will discuss with her psychiatrist.  Also discussed benefits to expect with donepezil - more likely to prevent progression of memory impairment than to improve memory.  Plan   Continue current medications  Recommend patient discuss bupropion with psychiatrist  Pain   Patient has tried these meds in past: tramadol Patient is currently uncontrolled on the following medications: gabapentin 100-200 mg BID, diclofenac 1% gel prn, lidocaine 5% patch  We discussed:  Pt is taking gabapentin 100 mg in the morning, she was not aware she could take up to 200 mg BID. Counseled on gabapentin side effects, pt agreed to slowly titrate dose to effect up to 200 mg BID as prescribed.  Plan  Continue current medications  Recommend to titrate gabapentin up to prescribed dose of 200 mg BID   Health Maintenance   Patient is currently controlled on the following medications: magnesium 500 mg prn, multivitamin, vitamin E 1000 unit daily, loratadine 10 mg daily, zinc   We discussed:  Pt is satisfied with OTC regimen.  Plan  Continue current medications    Medication Management   Pt uses [facility] for all medications Pt does not use pill box - keeps meds on dresser, in drawer, backup in plastic container Pt endorses  compliance   We discussed:  Verbal consent obtained for [facility] enhanced pharmacy services (medication synchronization, adherence packaging, delivery coordination). A  medication sync plan was created to allow patient to get all medications delivered once every 30 to 90 days per patient preference. Patient understands they have freedom to choose pharmacy and clinical pharmacist will coordinate care between all prescribers and [facility].   Plan  Utilize [facility] for medication synchronization, packaging and delivery    Follow up: 3 month phone visit  Charlene Brooke, PharmD Clinical Pharmacist [facility] [phone]

## 2019-09-18 NOTE — Patient Instructions (Addendum)
No Novolog at lunch  Try Tyler Aas at dinner and not am  No Novolog at lunch  Less bedtime snacks  Call re: Freestyle libre 2    You may register to get the vaccine at [facility] website  [email or web address]  You can also go directly to any of the following options:  1.  [place] website   [email or web address]  Telephone number: [phone], Option 2   2.  FEMA program at the [email or web address].[email or web address]   3.  You can register at the [place] website   DayTransfer.is or call [phone]   4.  Also can check on [email or web address]

## 2019-09-18 NOTE — Patient Instructions (Addendum)
Visit Information  Thank you for meeting with me to discuss your medications! I look forward to working with you to achieve your health care goals. Below is a summary of what we talked about during the visit:  Goals Addressed            This Visit's Progress   . Pharmacy Care Plan       CARE PLAN ENTRY  Current Barriers:  . Chronic Disease Management support, education, and care coordination needs related to  T1DM, gastroparesis, depression/anxiety, GERD, insomnia, fibromyalgia  Pharmacist Clinical Goal(s):  Marland Kitchen Maintain LDL < 100 . Reduce frequency of hypoglycemic events . Ensure safety, efficacy, and affordability of medications . Improve ease of medication administration  Interventions: . Comprehensive medication review performed. . Consider bupropion (Wellbutrin) in addition to lamotrigine for help with mood and fatigue - Discuss with Dr. Doyne Keel. . May take gabapentin 100 mg - 2 capsules up to twice a day to help with pain. Remember 100 mg per day is a very low dose. Marland Kitchen Discussed importance of diet for maintaining cholesterol goals . Utilize [facility] for medication synchronization, packaging and delivery  Patient Self Care Activities:  . Self administers medications as prescribed, Calls pharmacy for medication refills, and Calls provider office for new concerns or questions  Initial goal documentation       [name] was given information about Chronic Care Management services today including:  1. CCM service includes personalized support from designated clinical staff supervised by her physician, including individualized plan of care and coordination with other care providers 2. 24/7 contact phone numbers for assistance for urgent and routine care needs. 3. Standard insurance, coinsurance, copays and deductibles apply for chronic care management only during months in which we provide at least 20 minutes of these services. Most insurances cover these services at  100%, however patients may be responsible for any copay, coinsurance and/or deductible if applicable. This service may help you avoid the need for more expensive face-to-face services. 4. Only one practitioner may furnish and bill the service in a calendar month. 5. The patient may stop CCM services at any time (effective at the end of the month) by phone call to the office staff.  Patient agreed to services and verbal consent obtained.   The patient verbalized understanding of instructions provided today and agreed to receive a mailed copy of patient instruction and/or educational materials. Telephone follow up appointment with pharmacy team member scheduled for: 12/25/19 @ 1:00 pm   Charlene Brooke, PharmD Clinical Pharmacist [facility] [phone]  Cholesterol Content in Foods Cholesterol is a waxy, fat-like substance that helps to carry fat in the blood. The body needs cholesterol in small amounts, but too much cholesterol can cause damage to the arteries and heart. Most people should eat less than 200 milligrams (mg) of cholesterol a day. Foods with cholesterol  Cholesterol is found in animal-based foods, such as meat, seafood, and dairy. Generally, low-fat dairy and lean meats have less cholesterol than full-fat dairy and fatty meats. The milligrams of cholesterol per serving (mg per serving) of common cholesterol-containing foods are listed below. Meat and other proteins  Egg -- one large whole egg has 186 mg.  Veal shank -- 4 oz has 141 mg.  Lean ground [place] (93% lean) -- 4 oz has 118 mg.  Fat-trimmed lamb loin -- 4 oz has 106 mg.  Lean ground beef (90% lean) -- 4 oz has 100 mg.  Lobster -- 3.5  oz has 90 mg.  Pork loin chops -- 4 oz has 86 mg.  Canned salmon -- 3.5 oz has 83 mg.  Fat-trimmed beef top loin -- 4 oz has 78 mg.  Frankfurter -- 1 frank (3.5 oz) has 77 mg.  Crab -- 3.5 oz has 71 mg.  Roasted chicken without skin, white meat  -- 4 oz has 66 mg.  Light bologna -- 2 oz has 45 mg.  Deli-cut [place] -- 2 oz has 31 mg.  Canned tuna -- 3.5 oz has 31 mg.  Berniece Salines -- 1 oz has 29 mg.  Oysters and mussels (raw) -- 3.5 oz has 25 mg.  Mackerel -- 1 oz has 22 mg.  Trout -- 1 oz has 20 mg.  Pork sausage -- 1 link (1 oz) has 17 mg.  Salmon -- 1 oz has 16 mg.  Tilapia -- 1 oz has 14 mg. Dairy  Soft-serve ice cream --  cup (4 oz) has 103 mg.  Whole-milk yogurt -- 1 cup (8 oz) has 29 mg.  Cheddar cheese -- 1 oz has 28 mg.  American cheese -- 1 oz has 28 mg.  Whole milk -- 1 cup (8 oz) has 23 mg.  2% milk -- 1 cup (8 oz) has 18 mg.  Cream cheese -- 1 tablespoon (Tbsp) has 15 mg.  Cottage cheese --  cup (4 oz) has 14 mg.  Low-fat (1%) milk -- 1 cup (8 oz) has 10 mg.  Sour cream -- 1 Tbsp has 8.5 mg.  Low-fat yogurt -- 1 cup (8 oz) has 8 mg.  Nonfat Greek yogurt -- 1 cup (8 oz) has 7 mg.  Half-and-half cream -- 1 Tbsp has 5 mg. Fats and oils  Cod liver oil -- 1 tablespoon (Tbsp) has 82 mg.  Butter -- 1 Tbsp has 15 mg.  Lard -- 1 Tbsp has 14 mg.  Bacon grease -- 1 Tbsp has 14 mg.  Mayonnaise -- 1 Tbsp has 5-10 mg.  Margarine -- 1 Tbsp has 3-10 mg. Exact amounts of cholesterol in these foods may vary depending on specific ingredients and brands. Foods without cholesterol Most plant-based foods do not have cholesterol unless you combine them with a food that has cholesterol. Foods without cholesterol include:  Grains and cereals.  Vegetables.  Fruits.  Vegetable oils, such as olive, canola, and sunflower oil.  Legumes, such as peas, beans, and lentils.  Nuts and seeds.  Egg whites. Summary  The body needs cholesterol in small amounts, but too much cholesterol can cause damage to the arteries and heart.  Most people should eat less than 200 milligrams (mg) of cholesterol a day. This information is not intended to replace advice given to you by your health care provider. Make sure  you discuss any questions you have with your health care provider. Document Revised: 06/01/2017 Document Reviewed: 02/13/2017 Elsevier Patient Education  [place].

## 2019-09-18 NOTE — Progress Notes (Addendum)
Patient ID: Leslie Lane, female   DOB: 1955/09/11, 64 y.o.   MRN: [id number]  Reason for Appointment: Endocrinology follow-up   History of Present Illness    Diagnosis: Type 1 DIABETES MELITUS, diagnosed 1967      She has had labile blood sugar control over the last several years even though A1c has been usually around 7% She has had less lability and hypoglycemia with taking b.i.d. Lantus compared once a day She has been very sensitive to fast acting insulin and frequently does not require mealtime coverage She cannot tolerate Toujeo because of reported episode of headache, bloating and nausea with the first dose  RECENT history:  Insulin regimen: Tresiba 8 units daily, Novolog 1-2 units up to 3 times a day   Her A1c is now 6.6  Current blood sugar patterns and problems identified:  She now appears to have overall high readings in the mornings and averaging 190  Still has significant variability with some morning readings near normal also  She now thinks that anxiety is a highly positive snacks late at night and sometimes blood sugars are higher in the morning compared to bedtime  She now says that she will generally take 3 units of insulin with breakfast or even 4 units if the blood sugar is high even though previously she had not been taking mealtime coverage  Also will take 2 to 3 units at lunch and dinner  With her being more active in the afternoons she has recently low sugars in the 40s in the afternoon and before the dinnertime  Also periodically has low readings at bedtime in the 40s  As before she may not always recognize her low blood sugars  She is keeping her weight about the same  Previously had not wanted to continue her Dexcom sensor partly because of difficulty using it and also out-of-pocket expense   Hypoglycemia: Symptoms may be absent with low blood sugars and does not have early warning symptoms. Gets confused and she frequently depends  on her mother to recognize low sugars and treat them.  Her mother knows how to give Glucagon injection  She will treat her low blood sugars usually with juice, does carry glucose tablets with her      DIET: She is trying to get protein with eggs at breakfast Meal times: Variable.Marland Kitchen  She will eat more when she is anxious  Monitors blood glucose:  At least 4-5 times a day.    Glucometer:Accu-Chek Aviva   PRE-MEAL Fasting Lunch Dinner Bedtime Overall  Glucose range:  101-243  89-178  40-221  42-90   Mean/median:  190  130  114  113 123   Overnight: 104-274 with average 143   Previous readings:  PRE-MEAL Fasting Lunch Dinner Bedtime Overall  Glucose range:  77-299  76-138- 75-175  53-157   Mean/median:     ?   POST-MEAL PC Breakfast PC Lunch PC Dinner  Glucose range:   35  29-52  Mean/median:           Wt Readings from Last 3 Encounters:  09/18/19 111 lb 6.4 oz (50.5 kg)  07/30/19 112 lb (50.8 kg)  06/17/19 112 lb (50.8 kg)    Lab Results  Component Value Date   HGBA1C 6.6 (H) 09/16/2019   HGBA1C 6.5 06/16/2019   HGBA1C 6.8 (H) 01/20/2019   Lab Results  Component Value Date   MICROALBUR 1.9 09/16/2019   LDLCALC 89 06/16/2019   CREATININE 0.72 09/16/2019  Allergies as of 09/18/2019      Reactions   Penicillins Anaphylaxis   Sulfonamide Derivatives Anaphylaxis      Medication List       Accurate as of September 18, 2019  9:17 AM. If you have any questions, ask your nurse or doctor.        Accu-Chek Aviva Plus test strip Generic drug: glucose blood TEST BLOOD SUGAR FOUR TIMES DAILY   Accu-Chek Aviva Plus w/Device Kit USE AS DIRECTED   Accu-Chek Aviva Soln USE AS DIRECTED   Accu-Chek Softclix Lancets lancets Use as instructed to check blood sugar 4 times per day dx code E10.65 What changed: additional instructions   aspirin EC 81 MG tablet Take 81 mg by mouth every morning.   B-D SINGLE USE SWABS REGULAR Pads USE FOUR TIMES DAILY   B-D  ULTRAFINE III SHORT PEN 31G X 8 MM Misc Generic drug: Insulin Pen Needle USE 1 PEN NEEDLE TO INJECT FOUR TIMES DAILY   diclofenac sodium 1 % Gel Commonly known as: VOLTAREN APPLY 2 GRAMS EXTERNALLY TO THE AFFECTED AREA FOUR TIMES DAILY   diclofenac Sodium 1 % Gel Commonly known as: VOLTAREN APPLY 2 GRAMS EXTERNALLY TO THE AFFECTED AREA FOUR TIMES DAILY   donepezil 10 MG tablet Commonly known as: ARICEPT Take 1 tablet daily   Fish Oil 1000 MG Caps Take 1 capsule by mouth daily.   gabapentin 100 MG capsule Commonly known as: NEURONTIN TAKE 1 TO 2 CAPSULES(100 TO 200 MG) BY MOUTH TWICE DAILY   glucagon 1 MG Solr injection Commonly known as: GlucaGen HypoKit Inject 1 mg into the skin once as needed for up to 1 dose for low blood sugar.   Gvoke HypoPen 2-Pack 1 MG/0.2ML Soaj Generic drug: Glucagon INJECT 1 MG INTO THE SKIN AS NEEDED FOR HYPO   lamoTRIgine 100 MG tablet Commonly known as: LAMICTAL Take 1 tablet (100 mg total) by mouth 2 (two) times daily.   lidocaine 5 % Commonly known as: LIDODERM APPLY 1 PATCH ONTO THE SKIN DAILY. REMOVE AND DISCARD PATCH WITHIN 12 HOURS OR AS DIRECTED BY MD   lipase/protease/amylase 36000 UNITS Cpep capsule Commonly known as: Creon Take 2 capsules (72,000 Units total) by mouth 3 (three) times daily before meals.   loratadine 10 MG tablet Commonly known as: CLARITIN Take 10 mg by mouth as needed for allergies.   lovastatin 40 MG tablet Commonly known as: MEVACOR TAKE 1 TABLET AT BEDTIME   Magnesium 500 MG Tabs Take 1 tablet by mouth as needed.   metoCLOPramide 5 MG tablet Commonly known as: REGLAN TAKE 1 TABLET THREE TIMES DAILY BEFORE MEALS AS NEEDED   multivitamin tablet Take 1 tablet by mouth daily. Reported on 11/22/2015   NON FORMULARY Tart cherry abstract   NovoLOG FlexPen 100 UNIT/ML FlexPen Generic drug: insulin aspart INJECT 1 TO 3 UNITS UNDER THE SKIN UP TO 4 TIMES DAILY (DISCARD AND BEGIN A NEW PEN AFTER 28  DAYS)   omeprazole 40 MG capsule Commonly known as: PRILOSEC TAKE 1 CAPSULE(40 MG) BY MOUTH DAILY   traMADol 50 MG tablet Commonly known as: ULTRAM Take 1 tablet (50 mg total) by mouth every other day.   traZODone 100 MG tablet Commonly known as: DESYREL Take 4 tablets (400 mg total) by mouth at bedtime.   Tyler Aas FlexTouch 100 UNIT/ML FlexTouch Pen Generic drug: insulin degludec INJECT 15 UNITS INTO THE SKIN DAILY.   vitamin B-12 1000 MCG tablet Commonly known as: CYANOCOBALAMIN Take 1 tablet by  mouth daily.   vitamin E 1000 UNIT capsule Generic drug: vitamin E Take 1,000 Units by mouth daily.       Allergies:  Allergies  Allergen Reactions  . Penicillins Anaphylaxis  . Sulfonamide Derivatives Anaphylaxis    Past Medical History:  Diagnosis Date  . ALLERGIC RHINITIS   . Allergy   . Anemia   . Anxiety   . Arthritis   . BIPOLAR AFFECTIVE DISORDER   . Cataract   . DEPRESSION   . Diabetes mellitus type I ([place])   . DIABETES MELLITUS, TYPE II    follows with endo  . Fibromyalgia   . GERD   . HEPATITIS B   . HYPERLIPIDEMIA   . LOW BACK PAIN, CHRONIC   . MIGRAINE HEADACHE   . NARCOTIC ABUSE    hx of  . NECK PAIN, CHRONIC   . Neuropathy   . Seizures ([place])    pt states, "if my blood sugar drops to the 20's, I convulse.  It hasn't happened in a long time."  . SMOKER     Past Surgical History:  Procedure Laterality Date  . CESAREAN SECTION    . COLONOSCOPY    . TUBAL LIGATION    . UPPER GASTROINTESTINAL ENDOSCOPY      Family History  Problem Relation Age of Onset  . Arthritis Mother   . Colon cancer Mother        ? age of dx  . Arthritis Father   . Kidney disease Father   . Kidney cancer Father   . Heart attack Brother   . Bladder Cancer Sister   . Hyperlipidemia Other   . Kidney cancer Paternal Aunt   . Anxiety disorder Neg Hx   . Bipolar disorder Neg Hx   . Depression Neg Hx   . Breast cancer Neg Hx   . Esophageal cancer Neg Hx   .  Stomach cancer Neg Hx   . Rectal cancer Neg Hx     Social History:  reports that she has been smoking cigarettes. She has a 80.00 pack-year smoking history. She has never used smokeless tobacco. She reports current alcohol use. She reports that she does not use drugs.  Review of Systems:   Has probable gastroparesis treated with Reglan as before  NEUROPATHY: She has burning in her legs and feet at times treated with gabapentin 100 mg   DEPRESSION: She has had long-standing depression and anxiety on long-term treatment   HYPERLIPIDEMIA: The lipid abnormality consists of elevated LDL   Is  on lovastatin 40 mg with  good control   Lab Results  Component Value Date   CHOL 178 06/16/2019   HDL 80.80 06/16/2019   LDLCALC 89 06/16/2019   LDLDIRECT 101.7 03/26/2012   TRIG 45.0 06/16/2019   CHOLHDL 2 06/16/2019    Memory difficulties: She is on Aricept by the neurologist, is taking 10 mg    Examination:   BP 120/80   Pulse 76   Ht 5' 4"  (1.626 m)   Wt 111 lb 6.4 oz (50.5 kg)   SpO2 97%   BMI 19.12 kg/m   Body mass index is 19.12 kg/m.    ASSESSMENT/ PLAN:   Diabetes type 1 with poor control  See history of present illness for detailed discussion of current insulin, blood sugar patterns, problems identified  A1c has been under 7% consistently  Although her A1c is excellent at 6.6 she has frequent low blood sugars in the afternoons and evenings Most  of her low sugars are related to increased activity usually in the afternoons Despite this she is still take some mealtime insulin coverage at lunchtime With this her blood sugars may be as low as 40 and her average blood sugar is about 118 in the afternoon However fasting readings are higher than before as discussed above  Blood sugars are looking fairly good in the last 1-2 weeks or so from her history she is still having sporadic hypoglycemia with at least 3 significant episodes Hypoglycemia is again related to periodic  increased physical activity for which she does not compensate for snacks proactively She may be also occasionally taking additional NovoLog insulin despite being told not to take it blood sugars are below 200 As before she needs only minimal or no mealtime insulin coverage Hyperglycemia is very infrequent recently  Again discussed causes of hyperglycemia and hypoglycemia Discussed prevention of hypoglycemia especially with increased activity and to avoid extra NovoLog  Since the freestyle libre 2 will alert her to low sugars she may be a good candidate for this and this hopefully will be more affordable and easier to use than the Dexcom which she did not continue As before she is unlikely to be able to handle her insulin pump  Recommendations:  She can try taking her Tresiba at dinnertime instead of the morning to see if she will have less low sugars in the afternoons and better readings in the morning  She will try to cut back on high fat snacks and carbohydrates late at night  She should eliminate any mealtime insulin coverage at lunchtime unless blood sugars are significantly high  Also may not need NovoLog insulin at dinnertime unless eating significant carbohydrates  She will stay on 8 units of Tresiba  She should try and take her Reglan at least consistently at suppertime  Also needs to have a midafternoon snack and planning to be more active  She will be given the phone numbers for the freestyle libre and asked her to try and get the version 2  Neuropathy: No recent increase in symptoms  Microalbuminuria: Improved   Follow-up in 3 months    There are no Patient Instructions on file for this visit.     [name] 09/18/2019, 9:17 AM   Addendum: Statement: Patient is presently treated with 3 or more daily injections of insulin  [name]

## 2019-09-20 NOTE — Addendum Note (Signed)
Addended by: Aviva Signs M on: 09/20/2019 11:58 AM   Modules accepted: Orders

## 2019-09-23 ENCOUNTER — Encounter: Payer: Self-pay | Admitting: Family Medicine

## 2019-09-23 ENCOUNTER — Other Ambulatory Visit: Payer: Self-pay

## 2019-09-23 ENCOUNTER — Ambulatory Visit (INDEPENDENT_AMBULATORY_CARE_PROVIDER_SITE_OTHER): Payer: Medicare Other

## 2019-09-23 ENCOUNTER — Ambulatory Visit: Payer: Medicare Other | Admitting: Family Medicine

## 2019-09-23 VITALS — BP 130/60 | HR 77 | Ht 64.0 in | Wt 114.0 lb

## 2019-09-23 DIAGNOSIS — M6530 Trigger finger, unspecified finger: Secondary | ICD-10-CM | POA: Diagnosis not present

## 2019-09-23 DIAGNOSIS — M65342 Trigger finger, left ring finger: Secondary | ICD-10-CM

## 2019-09-23 NOTE — Progress Notes (Signed)
   Rito Ehrlich, am serving as a Education administrator for Dr. Lynne Leader.  Leslie Lane is a 64 y.o. female who presents to [place] [facility] today for trigger finger.  She was last seen by Dr. Tamala Julian on 06/17/19 for her L 4th finger trigger finger and for her R 3rd finger PIP.  She had a prior trigger finger injection on 02/18/19.  Since her last visit, pt reports finger is locking up and hurting.  Main issue today is left fourth digit triggering.   Pertinent review of systems: No fevers or chills  Relevant historical information: Diabetes   Exam:  BP 130/60 (BP Location: Left Arm, Patient Position: Sitting, Cuff Size: Normal)   Pulse 77   Ht 5\' 4"  (1.626 m)   Wt 114 lb (51.7 kg)   SpO2 97%   BMI 19.57 kg/m  General: Well Developed, well nourished, and in no acute distress.   MSK: Left hand triggering present at fourth digit with flexion of PIP.  Tender palpation palmar MCP. Intact strength.  Pulses cap refill and sensation intact distally.    Lab and Radiology Results  Procedure: Real-time Ultrasound Guided  left fourth digit A1 pulley trigger finger injection Device: Philips Affiniti 50G Images permanently stored and available for review in the ultrasound unit. Verbal informed consent obtained.  Discussed risks and benefits of procedure. Warned about infection bleeding damage to structures skin hypopigmentation and fat atrophy among others. Patient expresses understanding and agreement Time-out conducted.   Noted no overlying erythema, induration, or other signs of local infection.   Skin prepped in a sterile fashion.   Local anesthesia: Topical Ethyl chloride.   With sterile technique and under real time ultrasound guidance:  40 mg of Medrol  (80mg /ml) and 0.5 mL of lidocaine.  Total volume 1 mL.  Injected easily.   Completed without difficulty   Pain immediately resolved suggesting accurate placement of the medication.   Advised to call if  fevers/chills, erythema, induration, drainage, or persistent bleeding.   Images permanently stored and available for review in the ultrasound unit.  Impression: Technically successful ultrasound guided injection.         Assessment and Plan: 64 y.o. female with left hand fourth digit trigger finger.  Patient had previous injection in August 2020.  Discussed that if this happens again in the near future she should probably be referred to hand surgery however repeat injection at this point is warranted.  Plan for injection as above.  Also reviewed double Band-Aid splint.  Recheck back with myself or Dr. Tamala Julian as needed.    Orders Placed This Encounter  Procedures  . [place] LIMITED JOINT SPACE STRUCTURES UP RIGHT    Standing Status:   Future    Number of Occurrences:   1    Standing Expiration Date:   11/22/2020    Order Specific Question:   Reason for Exam (SYMPTOM  OR DIAGNOSIS REQUIRED)    Answer:   trigger finger    Order Specific Question:   Preferred imaging location?    Answer:   [place]   No orders of the defined types were placed in this encounter.    Discussed warning signs or symptoms. Please see discharge instructions. Patient expresses understanding.   The above documentation has been reviewed and is accurate and complete Lynne Leader

## 2019-09-23 NOTE — Patient Instructions (Signed)
Thank you for coming in today. Call or go to the ER if you develop a large red swollen joint with extreme pain or oozing puss.   Use the double bandaid splint at night. This will help to prevent triggering.  Recheck with me or Dr Tamala Julian as needed  Try using voltaren gel over the counter on the hand up to 4x daily.    Trigger Finger  Trigger finger, also called stenosing tenosynovitis,  is a condition that causes a finger to get stuck in a bent position. Each finger has a tendon, which is a tough, cord-like tissue that connects muscle to bone, and each tendon passes through a tunnel of tissue called a tendon sheath. To move your finger, your tendon needs to glide freely through the sheath. Trigger finger happens when the tendon or the sheath thickens, making it difficult to move your finger. Trigger finger can affect any finger or a thumb. It may affect more than one finger. Mild cases may clear up with rest and medicine. Severe cases require more treatment. What are the causes? Trigger finger is caused by a thickened finger tendon or tendon sheath. The cause of this thickening is not known. What increases the risk? The following factors may make you more likely to develop this condition:  Doing activities that require a strong grip.  Having rheumatoid arthritis, gout, or diabetes.  Being 48-24 years old.  Being female. What are the signs or symptoms? Symptoms of this condition include:  Pain when bending or straightening your finger.  Tenderness or swelling where your finger attaches to the palm of your hand.  A lump in the palm of your hand or on the inside of your finger.  Hearing a noise like a pop or a snap when you try to straighten your finger.  Feeling a catching or locking sensation when you try to straighten your finger.  Being unable to straighten your finger. How is this diagnosed? This condition is diagnosed based on your symptoms and a physical exam. How is this  treated? This condition may be treated by:  Resting your finger and avoiding activities that make symptoms worse.  Wearing a finger splint to keep your finger extended.  Taking NSAIDs, such as ibuprofen, to relieve pain and swelling.  Doing gentle exercises to stretch the finger as told by your health care provider.  Having medicine that reduces swelling and inflammation (steroids) injected into the tendon sheath. Injections may need to be repeated.  Having surgery to open the tendon sheath. This may be done if other treatments do not work and you cannot straighten your finger. You may need physical therapy after surgery. Follow these instructions at home: If you have a splint:  Wear the splint as told by your health care provider. Remove it only as told by your health care provider.  Loosen it if your fingers tingle, become numb, or turn cold and blue.  Keep it clean.  If the splint is not waterproof: ? Do not let it get wet. ? Cover it with a watertight covering when you take a bath or shower. Managing pain, stiffness, and swelling     If directed, apply heat to the affected area as often as told by your health care provider. Use the heat source that your health care provider recommends, such as a moist heat pack or a heating pad.  Place a towel between your skin and the heat source.  Leave the heat on for 20-30 minutes.  Remove  the heat if your skin turns bright red. This is especially important if you are unable to feel pain, heat, or cold. You may have a greater risk of getting burned. If directed, put ice on the painful area. To do this:  If you have a removable splint, remove it as told by your health care provider.  Put ice in a plastic bag.  Place a towel between your skin and the bag or between your splint and the bag.  Leave the ice on for 20 minutes, 2-3 times a day.  Activity  Rest your finger as told by your health care provider. Avoid activities that  make the pain worse.  Return to your normal activities as told by your health care provider. Ask your health care provider what activities are safe for you.  Do exercises as told by your health care provider.  Ask your health care provider when it is safe to drive if you have a splint on your hand. General instructions  Take over-the-counter and prescription medicines only as told by your health care provider.  Keep all follow-up visits as told by your health care provider. This is important. Contact a health care provider if:  Your symptoms are not improving with home care. Summary  Trigger finger, also called stenosing tenosynovitis, causes your finger to get stuck in a bent position. This can make it difficult and painful to straighten your finger.  This condition develops when a finger tendon or tendon sheath thickens.  Treatment may include resting your finger, wearing a splint, and taking medicines.  In severe cases, surgery to open the tendon sheath may be needed. This information is not intended to replace advice given to you by your health care provider. Make sure you discuss any questions you have with your health care provider. Document Revised: 11/04/2018 Document Reviewed: 11/04/2018 Elsevier Patient Education  [place].

## 2019-09-24 ENCOUNTER — Other Ambulatory Visit: Payer: Self-pay | Admitting: Internal Medicine

## 2019-10-08 ENCOUNTER — Telehealth: Payer: Self-pay

## 2019-10-08 NOTE — Telephone Encounter (Signed)
Dr. Dwyane Dee, if possible, can you addend chart notes to use the words "patient is presently insulin treated with 3 or more daily injection". Current chart note states that patient injects "up to 3 times daily".

## 2019-10-08 NOTE — Telephone Encounter (Signed)
Shirlee Limerick from [employer] called and requested and addendum be made to chart notes to stated that the pt is injecting insulin 3 or more times daily. After reviewing chart note, I informed Shirlee Limerick, that on multiple places in the note, it indicates the pt is injecting insulin 4 times daily. Shirlee Limerick will bring this to the attention of the documentation review team and get back with [place] if anything else is needed.

## 2019-10-13 ENCOUNTER — Encounter (HOSPITAL_BASED_OUTPATIENT_CLINIC_OR_DEPARTMENT_OTHER): Payer: Medicare Other | Admitting: Internal Medicine

## 2019-10-16 ENCOUNTER — Other Ambulatory Visit: Payer: Self-pay | Admitting: Internal Medicine

## 2019-10-16 NOTE — Telephone Encounter (Signed)
Addendum has been made as requested

## 2019-10-16 NOTE — Telephone Encounter (Signed)
This is not a chronic script and should not be refilled.

## 2019-10-16 NOTE — Telephone Encounter (Signed)
Updated chart notes sent to [employer].

## 2019-10-20 ENCOUNTER — Telehealth: Payer: Self-pay

## 2019-10-20 ENCOUNTER — Other Ambulatory Visit: Payer: Self-pay | Admitting: Internal Medicine

## 2019-10-20 NOTE — Telephone Encounter (Signed)
This was already declined in a separate encounter as it is not a chronic script and not eligible for refill.

## 2019-10-21 NOTE — Telephone Encounter (Signed)
Check [place] registry last filled 06/17/2019.Marland KitchenJohny Chess

## 2019-10-21 NOTE — Telephone Encounter (Signed)
Thanks

## 2019-10-21 NOTE — Telephone Encounter (Signed)
This is the third identical request. This is not a chronic medication and cannot be refilled.

## 2019-10-28 ENCOUNTER — Other Ambulatory Visit: Payer: Self-pay | Admitting: Internal Medicine

## 2019-10-29 ENCOUNTER — Telehealth: Payer: Self-pay | Admitting: Endocrinology

## 2019-10-29 NOTE — Telephone Encounter (Signed)
[employer] calling to check and make sure we received the fax that was sent over on the 26th - said shes tried a couple times.  [phone]

## 2019-10-30 NOTE — Telephone Encounter (Signed)
Paperwork was received and will be completed in accordance with policy.

## 2019-11-06 DIAGNOSIS — L720 Epidermal cyst: Secondary | ICD-10-CM | POA: Diagnosis not present

## 2019-11-10 NOTE — Telephone Encounter (Signed)
[employer] calling again following up on previous note regarding the fax they sent.

## 2019-11-11 NOTE — Telephone Encounter (Signed)
[employer] has been requesting chart notes. They were originally sent on 10/17/19. [employer] denied them and stated that they needed a date and signature on the addendum. This was done and faxed on 10/28/2019 with a fax confirmation received. Fax was sent to [phone]. [employer] then called and stated that they did not get it and requested that it be refaxed to [phone]. This was done on 11/06/19 with a fax confirmation received.

## 2019-11-12 NOTE — Telephone Encounter (Signed)
Leslie Lane with [employer] called re: the fax that was sent to [employer] on 11/06/19 was not received by [employer] and should have been faxed to fax# [phone] (not [phone]).

## 2019-11-12 NOTE — Telephone Encounter (Signed)
Called [employer] and spoke with representative named DJ about the issues that continue to arise regarding this company and receiving faxes for this patient. DJ gave me his personal fax number of [phone]. A copy will be faxed to him directly, and a copy will also be mailed. Both number in the message below were given to this office at various times to fax this paperwork to. Each time it is faxed, a fax confirmation is received and then a rep from [employer] calls and requests that the paperwork be sent to the other number. This has been a persistent issue since October 19, 2019. To avoid ongoing issues, the forms will be mailed. At this point, all that is needed is a copy of the chart notes with MD signature.

## 2019-11-13 ENCOUNTER — Other Ambulatory Visit: Payer: Self-pay

## 2019-11-13 DIAGNOSIS — L700 Acne vulgaris: Secondary | ICD-10-CM | POA: Diagnosis not present

## 2019-11-13 DIAGNOSIS — L720 Epidermal cyst: Secondary | ICD-10-CM | POA: Diagnosis not present

## 2019-11-13 MED ORDER — LOVASTATIN 40 MG PO TABS: 40.0000 mg | ORAL_TABLET | Freq: Every day | ORAL | 0 refills | Status: DC

## 2019-11-20 ENCOUNTER — Other Ambulatory Visit: Payer: Self-pay

## 2019-11-20 ENCOUNTER — Ambulatory Visit: Payer: Self-pay | Admitting: Pharmacist

## 2019-11-20 MED ORDER — OMEPRAZOLE 40 MG PO CPDR: DELAYED_RELEASE_CAPSULE | ORAL | 1 refills | Status: DC

## 2019-11-20 NOTE — Chronic Care Management (AMB) (Signed)
  Chronic Care Management   Outreach Note  11/20/2019 Name: Leslie Lane MRN: [id number] DOB: 1955/08/19  Referred by: [name], MD Reason for referral : Chronic Care Management (Patient call)   Fibromyalgia pain: Pt called requesting refill for tramadol. Discussed tramadol was meant to be a one-time short supply to allow gabapentin time to start working. Pt is currently taking gabapentin 100 mg BID and still reports significant pain, her rx is written for up to 200 mg BID so patient was counseled to increase dose as prescribed.   Allergies: Pt also complaining of allergy symptoms including runny nose and sinus congestion. She is taking loratadine daily and reports no benefit. Recommended to switch to Allegra for maintenance treatment, and use chlorpheniramine for severe symptoms. Also recommended Flonase nasal spray 1-2 sprays each nostril up to BID. Pt voiced understanding of recommendations.  GERD: Pt requesting refill for omeprazole. Will coordinate with PCP for refill.    Charlene Brooke, PharmD Clinical Pharmacist [facility] at [facility] [phone]

## 2019-11-21 DIAGNOSIS — Z794 Long term (current) use of insulin: Secondary | ICD-10-CM | POA: Diagnosis not present

## 2019-11-21 DIAGNOSIS — E1065 Type 1 diabetes mellitus with hyperglycemia: Secondary | ICD-10-CM | POA: Diagnosis not present

## 2019-11-23 ENCOUNTER — Other Ambulatory Visit: Payer: Self-pay | Admitting: Endocrinology

## 2019-11-23 ENCOUNTER — Other Ambulatory Visit: Payer: Self-pay | Admitting: Internal Medicine

## 2019-11-24 NOTE — Telephone Encounter (Signed)
ADS called and just wanted to let us know that everything is done and good to go regarding this patient.

## 2019-11-25 NOTE — Telephone Encounter (Signed)
Pls advise if ok to refill../lmb 

## 2019-11-27 ENCOUNTER — Encounter (HOSPITAL_COMMUNITY): Payer: Self-pay | Admitting: Psychiatry

## 2019-11-27 ENCOUNTER — Other Ambulatory Visit: Payer: Self-pay

## 2019-11-27 ENCOUNTER — Telehealth (INDEPENDENT_AMBULATORY_CARE_PROVIDER_SITE_OTHER): Payer: Medicare Other | Admitting: Psychiatry

## 2019-11-27 DIAGNOSIS — G4701 Insomnia due to medical condition: Secondary | ICD-10-CM | POA: Diagnosis not present

## 2019-11-27 DIAGNOSIS — F332 Major depressive disorder, recurrent severe without psychotic features: Secondary | ICD-10-CM

## 2019-11-27 DIAGNOSIS — F4010 Social phobia, unspecified: Secondary | ICD-10-CM

## 2019-11-27 MED ORDER — LAMOTRIGINE 100 MG PO TABS: 100.0000 mg | ORAL_TABLET | Freq: Two times a day (BID) | ORAL | 0 refills | Status: DC

## 2019-11-27 MED ORDER — METOCLOPRAMIDE HCL 5 MG PO TABS: ORAL_TABLET | ORAL | 0 refills | Status: DC

## 2019-11-27 MED ORDER — TRAZODONE HCL 100 MG PO TABS: 400.0000 mg | ORAL_TABLET | Freq: Every day | ORAL | 0 refills | Status: DC

## 2019-11-27 MED ORDER — VORTIOXETINE HBR 5 MG PO TABS: 5.0000 mg | ORAL_TABLET | Freq: Every day | ORAL | 2 refills | Status: DC

## 2019-11-27 NOTE — Progress Notes (Signed)
Virtual Visit via Telephone Note  I connected with Leslie Lane on 11/27/19 at  1:30 PM EDT by telephone and verified that I am speaking with the correct person using two identifiers. Maize does not have Internet or a computer and is unable to do a video session.   Location: Patient: home Provider: office   I discussed the limitations, risks, security and privacy concerns of performing an evaluation and management service by telephone and the availability of in person appointments. I also discussed with the patient that there may be a patient responsible charge related to this service. The patient expressed understanding and agreed to proceed.   History of Present Illness: Tonika shares that nothing has changed. Her mom is tired of her being this way. Lilyth's depression remains unchanged. She is always tired and has no motivation. Chevette is endorsing isolation, poor hygiene and anhedonia. She denies SI/HI. She denies anxiety because she is not around anyone. She often feels apathetic. Estafani is willing to try a new antidepressant.    Observations/Objective:  General Appearance: unable to assess  Eye Contact:  unable to assess  Speech:  Clear and Coherent and Normal Rate  Volume:  Normal  Mood:  Anxious and Depressed  Affect:  Congruent  Thought Process:  Goal Directed and Descriptions of Associations: Intact  Orientation:  Full (Time, Place, and Person)  Thought Content:  Logical  Suicidal Thoughts:  No  Homicidal Thoughts:  No  Memory:  Immediate;   Poor  Judgement:  Poor  Insight:  Fair  Psychomotor Activity: unable to assess  Concentration:  Concentration: Poor  Recall:  Poor  Fund of Knowledge:  Fair  Language:  Fair  Akathisia:  unable to assess  Handed:  Right  AIMS (if indicated):     Assets:  Desire for Improvement Financial Resources/Insurance Housing  ADL's:  unable to assess  Cognition:  WNL  Sleep:         Assessment and Plan: MDD-recurrent, severe without  psychotic features; GAD; Social anxiety d/o; Insomnia      She is unable to afford [place]   Previous med trial were ineffective   Her finances are limited and she is on a strict budget.   Lamictal 100mg  po BID for MDD   Trazodone 400mg  po qHS for MDD and insomnia  Start trial of Trintellix 5mg  po qD    Follow Up Instructions: In 2 months or sooner if needed   I discussed the assessment and treatment plan with the patient. The patient was provided an opportunity to ask questions and all were answered. The patient agreed with the plan and demonstrated an understanding of the instructions.   The patient was advised to call back or seek an in-person evaluation if the symptoms worsen or if the condition fails to improve as anticipated.  I provided 20 minutes of non-face-to-face time during this encounter.   Charlcie Cradle, MD

## 2019-12-02 ENCOUNTER — Telehealth: Payer: Self-pay | Admitting: Nutrition

## 2019-12-02 NOTE — Telephone Encounter (Signed)
Thanks

## 2019-12-02 NOTE — Telephone Encounter (Signed)
Patient walked into my office with her Leslie Lane 2, and requested training on how to use this.  Her mother was present.  We discussed how this is different that blood sugar readings. She loaded and attached a sensor to her arm and started the start-up timer.  She was shown how to read results and what the arrows tell her.  She was also encouraged to test her blood sugar readings when the magnifier appears on the screen, or when she feels that the readings may not be accurate.   She and her mother both reported good understanding of this with no final questions. She does not have a computer at home, which makes linking the readings impossible.   She was encouraged to bring the reader with her as well as her meter to each visit.

## 2019-12-03 ENCOUNTER — Other Ambulatory Visit: Payer: Self-pay

## 2019-12-03 ENCOUNTER — Ambulatory Visit: Payer: Medicare Other | Admitting: Nutrition

## 2019-12-03 MED ORDER — INSULIN LISPRO (1 UNIT DIAL) 100 UNIT/ML (KWIKPEN): PEN_INJECTOR | SUBCUTANEOUS | 1 refills | Status: DC

## 2019-12-05 ENCOUNTER — Other Ambulatory Visit: Payer: Self-pay

## 2019-12-05 MED ORDER — GVOKE HYPOPEN 2-PACK 1 MG/0.2ML ~~LOC~~ SOAJ: 1.0000 mg | SUBCUTANEOUS | 1 refills | Status: DC | PRN

## 2019-12-16 ENCOUNTER — Other Ambulatory Visit (INDEPENDENT_AMBULATORY_CARE_PROVIDER_SITE_OTHER): Payer: Medicare Other

## 2019-12-16 ENCOUNTER — Encounter: Payer: Self-pay | Admitting: Internal Medicine

## 2019-12-16 ENCOUNTER — Ambulatory Visit (INDEPENDENT_AMBULATORY_CARE_PROVIDER_SITE_OTHER): Payer: Medicare Other | Admitting: Internal Medicine

## 2019-12-16 ENCOUNTER — Other Ambulatory Visit: Payer: Self-pay

## 2019-12-16 DIAGNOSIS — F3132 Bipolar disorder, current episode depressed, moderate: Secondary | ICD-10-CM

## 2019-12-16 DIAGNOSIS — E1043 Type 1 diabetes mellitus with diabetic autonomic (poly)neuropathy: Secondary | ICD-10-CM | POA: Diagnosis not present

## 2019-12-16 DIAGNOSIS — K3184 Gastroparesis: Secondary | ICD-10-CM | POA: Diagnosis not present

## 2019-12-16 DIAGNOSIS — E1065 Type 1 diabetes mellitus with hyperglycemia: Secondary | ICD-10-CM | POA: Diagnosis not present

## 2019-12-16 DIAGNOSIS — E440 Moderate protein-calorie malnutrition: Secondary | ICD-10-CM | POA: Diagnosis not present

## 2019-12-16 LAB — BASIC METABOLIC PANEL
BUN: 23 mg/dL (ref 6–23)
CO2: 33 mEq/L — ABNORMAL HIGH (ref 19–32)
Calcium: 9.6 mg/dL (ref 8.4–10.5)
Chloride: 99 mEq/L (ref 96–112)
Creatinine, Ser: 0.77 mg/dL (ref 0.40–1.20)
GFR: 75.08 mL/min (ref >60.00)
Glucose, Bld: 155 mg/dL — ABNORMAL HIGH (ref 70–99)
Potassium: 3.7 mEq/L (ref 3.5–5.1)
Sodium: 136 mEq/L (ref 135–145)

## 2019-12-16 LAB — HEMOGLOBIN A1C: Hgb A1c MFr Bld: 7.1 % — ABNORMAL HIGH (ref 4.6–6.5)

## 2019-12-16 NOTE — Assessment & Plan Note (Signed)
Taking lamictal and trazodone currently. Will notify her menta health provider she did not tolerate trintellix and see if they can reach out to her with other options or to make apt to discuss if needed.

## 2019-12-16 NOTE — Assessment & Plan Note (Signed)
Weight is down about 5 pounds in the last 3 months and she is not eating well. Likely walking a lot when she feels she has overeaten sweets and not eating enough calories.

## 2019-12-16 NOTE — Assessment & Plan Note (Signed)
Sugars are still somewhat labile and she does not eat well. Weight is down about 5 pounds in the last 3 months and she is unsure about why. Using continuous glucose monitoring which given her severe hypoglycemia in the past is very good and helps with her safety. It has beeped (indicating low sugar) several times to alert her since starting use.

## 2019-12-16 NOTE — Progress Notes (Signed)
   Subjective:   Patient ID: Leslie Lane, female    DOB: [birth year], 64 y.o.   MRN: [id number]  HPI The patient is a 64 YO female coming in for concerns about depression (has tried many different agents, seeing mental health for this, they added trintellix at last visit and she took for 7 days this did give her headaches and she was taking tylenol for these and worried about taking too much tylenol, she did not call their office to let them know as this takes too much out of her, denies missing lamictal which she has been on for some time or trazodone) and diabetes (using continuous glucose monitoring, having some low sugars but a little less often, she does not have great appetite and eats only to keep sugars from going low when taking insulin, does binge on sweets in the evening, then walks around back yard to help lower sugars and takes insulin shot in the evening when eating sweets also, denies current worry about infection) and low energy (she is tired and sleeping most of the time, she does have some depression and seeing mental health, denies snoring, does wake feeling restored).   Review of Systems  Constitutional: Positive for activity change, appetite change, fatigue and unexpected weight change.  HENT: Negative.   Eyes: Negative.   Respiratory: Negative for cough, chest tightness and shortness of breath.   Cardiovascular: Negative for chest pain, palpitations and leg swelling.  Gastrointestinal: Positive for abdominal pain. Negative for abdominal distention, constipation, diarrhea, nausea and vomiting.  Musculoskeletal: Negative.   Skin: Negative.   Neurological: Positive for numbness.  Psychiatric/Behavioral: Positive for decreased concentration, dysphoric mood and sleep disturbance. Negative for self-injury and suicidal ideas.    Objective:  Physical Exam Constitutional:      Appearance: She is well-developed.  HENT:     Head: Normocephalic and atraumatic.  Cardiovascular:       Rate and Rhythm: Normal rate and regular rhythm.  Pulmonary:     Effort: Pulmonary effort is normal. No respiratory distress.     Breath sounds: Normal breath sounds. No wheezing or rales.  Abdominal:     General: Bowel sounds are normal. There is no distension.     Palpations: Abdomen is soft.     Tenderness: There is no abdominal tenderness. There is no rebound.  Musculoskeletal:     Cervical back: Normal range of motion.  Skin:    General: Skin is warm and dry.  Neurological:     Mental Status: She is alert and oriented to person, place, and time.     Coordination: Coordination normal.  Psychiatric:     Comments: Mood relatively flat    Vitals:   12/16/19 1259  BP: 124/76  Pulse: 90  Temp: 98.4 F (36.9 C)  TempSrc: Oral  SpO2: 95%  Weight: 109 lb (49.4 kg)  Height: 5\' 4"  (1.626 m)    This visit occurred during the SARS-CoV-2 public health emergency.  Safety protocols were in place, including screening questions prior to the visit, additional usage of staff PPE, and extensive cleaning of exam room while observing appropriate contact time as indicated for disinfecting solutions.   Assessment & Plan:

## 2019-12-16 NOTE — Patient Instructions (Signed)
We will reach out to Dr. Doyne Keel to see if there are other options for the mood to help you feel better.

## 2019-12-19 ENCOUNTER — Ambulatory Visit (INDEPENDENT_AMBULATORY_CARE_PROVIDER_SITE_OTHER): Payer: Medicare Other | Admitting: Endocrinology

## 2019-12-19 ENCOUNTER — Encounter: Payer: Self-pay | Admitting: Endocrinology

## 2019-12-19 ENCOUNTER — Other Ambulatory Visit: Payer: Self-pay

## 2019-12-19 VITALS — BP 140/60 | HR 73 | Ht 64.0 in | Wt 109.0 lb

## 2019-12-19 DIAGNOSIS — E1065 Type 1 diabetes mellitus with hyperglycemia: Secondary | ICD-10-CM

## 2019-12-19 DIAGNOSIS — E1043 Type 1 diabetes mellitus with diabetic autonomic (poly)neuropathy: Secondary | ICD-10-CM | POA: Diagnosis not present

## 2019-12-19 DIAGNOSIS — K3184 Gastroparesis: Secondary | ICD-10-CM | POA: Diagnosis not present

## 2019-12-19 DIAGNOSIS — E1042 Type 1 diabetes mellitus with diabetic polyneuropathy: Secondary | ICD-10-CM | POA: Diagnosis not present

## 2019-12-19 NOTE — Progress Notes (Signed)
Patient ID: Leslie Lane, female   DOB: 08/05/1955, 64 y.o.   MRN: [id number]  Reason for Appointment: Endocrinology follow-up   History of Present Illness    Diagnosis: Type 1 DIABETES MELITUS, diagnosed 1967      She has had labile blood sugar control over the last several years even though A1c has been usually around 7% She has had less lability and hypoglycemia with taking b.i.d. Lantus compared once a day She has been very sensitive to fast acting insulin and frequently does not require mealtime coverage She cannot tolerate Toujeo because of reported episode of headache, bloating and nausea with the first dose  RECENT history:  Insulin regimen: Tresiba 8 units daily, Novolog 1-2 units up to 3 times a day   Her A1c is now 7.1 compared to 6.6  Current blood sugar patterns and problems identified:  She now has been using the freestyle libre at least 2 weeks, has no difficulty using this  She thinks that this is relatively accurate with some minor discrepancies and review of her data indicates fairly accurate readings  CGM now reveals that she has progressive increase in blood sugar overnight  Currently taking Tresiba in the morning  She is still continuing to take 1-4 units of NovoLog before each meal, usually more if the sugars are higher especially at breakfast  Mostly having low readings in the evenings after dinner and over the next 3 hours, sometimes persistently  She think that this may be related to her being more active in the evenings after 6 PM and blood sugars have been as low as 30 in the evening last month at about 9 PM  She is again not getting any carbohydrate snacks or simple sugars to compensate for lower readings when she is getting more active  She has not had any severe hypoglycemia recently   Hypoglycemia: Symptoms may be absent with low blood sugars and does not have early warning symptoms. Gets confused and she frequently depends on her  mother to recognize low sugars and treat them.  Her mother knows how to give Glucagon injection  She will treat her low blood sugars usually with juice, does carry glucose tablets with her      DIET: She is trying to get protein with eggs at breakfast Meal times: Variable.Marland Kitchen  She will eat more when she is anxious   Previously on Aviva  CONTINUOUS GLUCOSE MONITORING RECORD INTERPRETATION    Dates of Recording: Last 2 weeks  Sensor description: Elenor Legato  Results statistics:   CGM use % of time  94  Average and SD  137, GV 36  Time in range     72   %  % Time Above 180  18  % Time above 250 2  % Time Below target  8    PRE-MEAL Fasting Lunch Dinner Bedtime Overall  Glucose range:       Mean/median:  172  152  109  90  137   POST-MEAL PC Breakfast PC Lunch PC Dinner  Glucose range:     Mean/median:  148  126  104    Glycemic patterns summary: Blood sugars are averaging about 120 at midnight, gradually increasing to the highest level around 8 AM and then gradually declining to the lowest level around 9 PM. Hypoglycemia is mostly after 6 PM  Hyperglycemic episodes are occurring variably at midday, overnight and early morning  Hypoglycemic episodes occurred mostly around 8-10 PM  Overnight periods:  Blood sugars are near normal at midnight and then usually gradually increase progressively  Preprandial periods: Blood sugars are mostly high at breakfast, mildly increased at lunch and near normal at dinner as seen in the average is above  Postprandial periods:   After breakfast: Glucose readings are level compared to premeal readings usually and generally lower if higher to start with but not getting hypoglycemic After lunch:   Usually at lunchtime blood sugars are flat to slightly lower After dinner: Blood sugar usually is trending downwards after dinner especially after 8 PM  PREVIOUS data:  PRE-MEAL Fasting Lunch Dinner Bedtime Overall  Glucose range:  101-243  89-178   40-221  42-90   Mean/median:  190  130  114  113 123   Overnight: 104-274 with average 143      Wt Readings from Last 3 Encounters:  12/19/19 109 lb (49.4 kg)  12/16/19 109 lb (49.4 kg)  09/23/19 114 lb (51.7 kg)    Lab Results  Component Value Date   HGBA1C 7.1 (H) 12/16/2019   HGBA1C 6.6 (H) 09/16/2019   HGBA1C 6.5 06/16/2019   Lab Results  Component Value Date   MICROALBUR 1.9 09/16/2019   LDLCALC 89 06/16/2019   CREATININE 0.77 12/16/2019        Allergies as of 12/19/2019      Reactions   Penicillins Anaphylaxis   Sulfonamide Derivatives Anaphylaxis      Medication List       Accurate as of December 19, 2019 10:45 AM. If you have any questions, ask your nurse or doctor.        Accu-Chek Aviva Plus test strip Generic drug: glucose blood TEST BLOOD SUGAR FOUR TIMES DAILY   Accu-Chek Aviva Plus w/Device Kit USE AS DIRECTED   Accu-Chek Aviva Soln USE AS DIRECTED   Accu-Chek Softclix Lancets lancets Use as instructed to check blood sugar 4 times per day dx code E10.65 What changed: additional instructions   aspirin EC 81 MG tablet Take 81 mg by mouth every morning.   B-D SINGLE USE SWABS REGULAR Pads USE FOUR TIMES DAILY   B-D ULTRAFINE III SHORT PEN 31G X 8 MM Misc Generic drug: Insulin Pen Needle USE 1 PEN NEEDLE TO INJECT FOUR TIMES DAILY   diclofenac Sodium 1 % Gel Commonly known as: VOLTAREN APPLY 2 GRAMS EXTERNALLY TO THE AFFECTED AREA FOUR TIMES DAILY   donepezil 10 MG tablet Commonly known as: ARICEPT Take 1 tablet daily   Fish Oil 1000 MG Caps Take 1 capsule by mouth daily.   gabapentin 100 MG capsule Commonly known as: NEURONTIN TAKE 1 TO 2 CAPSULES(100 TO 200 MG) BY MOUTH TWICE DAILY   Gvoke HypoPen 2-Pack 1 MG/0.2ML Soaj Generic drug: Glucagon Inject 1 mg into the skin as needed. For Hypo   insulin lispro 100 UNIT/ML KwikPen Commonly known as: HumaLOG KwikPen Inject 10 units under the skin at breakfast, 8 units at dinner,  and 1-3 units as needed for high blood sugars. What changed: additional instructions   lamoTRIgine 100 MG tablet Commonly known as: LAMICTAL Take 1 tablet (100 mg total) by mouth 2 (two) times daily.   lidocaine 5 % Commonly known as: LIDODERM UNWRAP AND APPLY 1 PATCH TO SKIN EVERY DAY(REMOVE AND DISCARD PATCH WITHIN 12 HOURS OR AS DIRECTED BY DOCTOR)   lipase/protease/amylase 36000 UNITS Cpep capsule Commonly known as: Creon Take 2 capsules (72,000 Units total) by mouth 3 (three) times daily before meals.   loratadine 10 MG tablet Commonly  known as: CLARITIN Take 10 mg by mouth as needed for allergies.   lovastatin 40 MG tablet Commonly known as: MEVACOR Take 1 tablet (40 mg total) by mouth at bedtime.   Magnesium 500 MG Tabs Take 1 tablet by mouth as needed.   metoCLOPramide 5 MG tablet Commonly known as: REGLAN TAKE 1 TABLET THREE TIMES DAILY BEFORE MEALS AS NEEDED   multivitamin tablet Take 1 tablet by mouth daily. Reported on 11/22/2015   omeprazole 40 MG capsule Commonly known as: PRILOSEC Take one capsule by mouth daily   traZODone 100 MG tablet Commonly known as: DESYREL Take 4 tablets (400 mg total) by mouth at bedtime.   Tyler Aas FlexTouch 100 UNIT/ML FlexTouch Pen Generic drug: insulin degludec INJECT 15 UNITS INTO THE SKIN DAILY.   vitamin B-12 1000 MCG tablet Commonly known as: CYANOCOBALAMIN Take 1 tablet by mouth daily.   vitamin E 1000 UNIT capsule Generic drug: vitamin E Take 1,000 Units by mouth daily.   vortioxetine HBr 5 MG Tabs tablet Commonly known as: Trintellix Take 1 tablet (5 mg total) by mouth daily.       Allergies:  Allergies  Allergen Reactions  . Penicillins Anaphylaxis  . Sulfonamide Derivatives Anaphylaxis    Past Medical History:  Diagnosis Date  . ALLERGIC RHINITIS   . Allergy   . Anemia   . Anxiety   . Arthritis   . BIPOLAR AFFECTIVE DISORDER   . Cataract   . DEPRESSION   . Diabetes mellitus type I ([place])     . DIABETES MELLITUS, TYPE II    follows with endo  . Fibromyalgia   . GERD   . HEPATITIS B   . HYPERLIPIDEMIA   . LOW BACK PAIN, CHRONIC   . MIGRAINE HEADACHE   . NARCOTIC ABUSE    hx of  . NECK PAIN, CHRONIC   . Neuropathy   . Seizures ([place])    pt states, "if my blood sugar drops to the 20's, I convulse.  It hasn't happened in a long time."  . SMOKER     Past Surgical History:  Procedure Laterality Date  . CESAREAN SECTION    . COLONOSCOPY    . TUBAL LIGATION    . UPPER GASTROINTESTINAL ENDOSCOPY      Family History  Problem Relation Age of Onset  . Arthritis Mother   . Colon cancer Mother        ? age of dx  . Arthritis Father   . Kidney disease Father   . Kidney cancer Father   . Heart attack Brother   . Bladder Cancer Sister   . Hyperlipidemia Other   . Kidney cancer Paternal Aunt   . Anxiety disorder Neg Hx   . Bipolar disorder Neg Hx   . Depression Neg Hx   . Breast cancer Neg Hx   . Esophageal cancer Neg Hx   . Stomach cancer Neg Hx   . Rectal cancer Neg Hx     Social History:  reports that she has been smoking cigarettes. She has a 80.00 pack-year smoking history. She has never used smokeless tobacco. She reports current alcohol use. She reports that she does not use drugs.  Review of Systems:   Had gastroparesis treated with Reglan but has no nausea, only abdominal discomfort  NEUROPATHY: She has burning in her legs and feet at times treated with gabapentin 100 mg   DEPRESSION: She has had long-standing depression and anxiety on long-term treatment   HYPERLIPIDEMIA: The  lipid abnormality consists of elevated LDL   Is  on lovastatin 40 mg with  good control   Lab Results  Component Value Date   CHOL 178 06/16/2019   HDL 80.80 06/16/2019   LDLCALC 89 06/16/2019   LDLDIRECT 101.7 03/26/2012   TRIG 45.0 06/16/2019   CHOLHDL 2 06/16/2019    Memory difficulties: She is on Aricept by the neurologist, is taking 10 mg    Examination:   BP  140/60 (BP Location: Left Arm, Patient Position: Sitting, Cuff Size: Normal)   Pulse 73   Ht 5' 4"  (1.626 m)   Wt 109 lb (49.4 kg)   SpO2 94%   BMI 18.71 kg/m   Body mass index is 18.71 kg/m.    ASSESSMENT/ PLAN:   Diabetes type 1 with poor control  See history of present illness for detailed discussion of current insulin, blood sugar patterns, problems identified  A1c has been under 7% previously but now 7.1  In the last 2 weeks she has used the freestyle libre With this she likely has been able to reduce her hypoglycemia because of the low glucose alerts She is still not able to understand the need to avoid hypoglycemia Usually she has low sugars with increased physical activities which she is doing in the evenings between about 6-9 PM She continues to take mealtime insulin at all meals even though not clear if she requires mealtime coverage consistently Currently her blood sugar patterns indicate need for higher basal insulin in the early morning but this may also be a rebound from frequent low sugars in the evenings Fasting readings are fairly consistently high as a result  As before she is unlikely to be able to learn how to use an insulin pump especially with her difficulties with using Dexcom previously  Recommendations:  Although she can try taking her Tresiba at dinnertime instead of the morning she thinks she will forget this  Unless she is planning not to do any physical activity she should stop NovoLog completely at dinnertime  Discussed that instead of waiting sugar-free cookies or peanut butter she needs to eat more carbohydrate or have simple sugars when she is more active especially in the evenings when her sugars are the lowest  She should try and prevent low blood sugars when blood sugars are trending downwards  No need to change her Tresiba dose is now since daytime blood sugars are generally not running higher Premeal, this will need to be reassessed after  reducing her hypoglycemia in the evenings  Avoid high-fat foods and snacks late evening  Call if having consistently high readings throughout the day  Discussed blood sugar targets before and after meals    Follow-up in 3 months    Patient Instructions  No Novolog at supper  Starchy snacks when active      [name] 12/19/2019, 10:45 AM

## 2019-12-19 NOTE — Patient Instructions (Signed)
No Novolog at supper  Starchy snacks when active

## 2019-12-25 ENCOUNTER — Ambulatory Visit: Payer: Medicare Other | Admitting: Pharmacist

## 2019-12-25 ENCOUNTER — Other Ambulatory Visit: Payer: Self-pay

## 2019-12-25 DIAGNOSIS — F17210 Nicotine dependence, cigarettes, uncomplicated: Secondary | ICD-10-CM

## 2019-12-25 DIAGNOSIS — F332 Major depressive disorder, recurrent severe without psychotic features: Secondary | ICD-10-CM

## 2019-12-25 DIAGNOSIS — E1065 Type 1 diabetes mellitus with hyperglycemia: Secondary | ICD-10-CM

## 2019-12-25 DIAGNOSIS — E1069 Type 1 diabetes mellitus with other specified complication: Secondary | ICD-10-CM

## 2019-12-25 NOTE — Chronic Care Management (AMB) (Signed)
Chronic Care Management Pharmacy  Name: Leslie Lane  MRN: [id number] DOB: 04/22/1956   Chief Complaint/ HPI  Leslie Lane,  64 y.o. , female presents for their Initial CCM visit with the clinical pharmacist via telephone due to COVID-19 Pandemic.  PCP : [name], MD  Their chronic conditions include: T1DM w/ gastroparesis, depression/anxiety, insomnia, GERD, fibromyalgia  Lives with mother, she gives glucagon shot if needed. Brother and 2 sisters, sister visits about once a week.    Fatigue is biggest problem.    Office Visits:  12/16/19 Dr Sharlet Salina OV: f/u depression. Trintellix gave her headaches, stopped after 7 days. Refer back to psych.  06/17/19 Dr Sharlet Salina OV: refilled tramadol, but this will not be chronic rx. Gabapentin is maintenance tx for low back pain. Pt still smoking, not ready to quit.  Consult Visit: 12/19/19 Dr Dwyane Dee (endocrine): not a good candidate for insulin pump. Stop Humalog at dinnertime due to evening lows. Needs simple sugars when she is more active to avoid lows (not sugar-free cookies or peanut butter). Avoid high fat snacks late evening.  11/27/19 Dr Doyne Keel (psych): previous med trial ineffective. Trial Trintellix 5 mg.  09/18/19 Dr Dwyane Dee (endocrine): frequent low sugars, down to 40s, with hypoglycemia unawareness. Low sugar usually related to increased activity in afternoons, she still takes Novolog at lunch. Recs: move [place] to dinnertime, cut back on high-carb snacks at bedtime, eliminate mealtime coverage at lunch, may not need at dinnertime unless significant carb load. Pt to call re: Freestyle Libre 2.   08/28/19 Dr Doyne Keel (psych): pt unwilling to try new meds d/t fear of SE. Finances limited, strict budget.   07/31/19 Dr Anselm Pancoast (neurology): memory loss, started after diabetic coma at 36. [place] 14/30. MRI 01/2019 negative acute changes. Started donepezil and pt reports still having memory loss. Several pillboxes at home but does not  use, sometimes forgets meds. Ordered home sleep study.   06/18/19 Dr Dwyane Dee (endocrine): labile BG although A1c is controlled. Cannot tolerate Toujeo d/t headache, bloating, nausea. Significant issues with hypoglycemia. Thinking about Freestyle Libre 2 due to low alert capability. Would not be good candidate for insulin pump.  06/17/19 Dr Tamala Julian (sports medicine): trigger finger, receiving prn injections.  05/15/19 Dr Doyne Keel (psych): discussed other med options, pt declined all options d/t cost or fear of SE, though about low-dose Ritalin but pt declined, pt stated "I guess I will just stay feeling like I am feeling".   Medications: Outpatient Encounter Medications as of 12/25/2019  Medication Sig  . ACCU-CHEK SOFTCLIX LANCETS lancets Use as instructed to check blood sugar 4 times per day dx code E10.65 (Patient taking differently: Use as instructed to check blood sugar 8 times per day dx code E10.65)  . Alcohol Swabs (B-D SINGLE USE SWABS REGULAR) PADS USE FOUR TIMES DAILY  . aspirin EC 81 MG tablet Take 81 mg by mouth every morning.  . B-D ULTRAFINE III SHORT PEN 31G X 8 MM MISC USE 1 PEN NEEDLE TO INJECT FOUR TIMES DAILY  . Blood Glucose Calibration (ACCU-CHEK AVIVA) SOLN USE AS DIRECTED  . Blood Glucose Monitoring Suppl (ACCU-CHEK AVIVA PLUS) w/Device KIT USE AS DIRECTED  . diclofenac Sodium (VOLTAREN) 1 % GEL APPLY 2 GRAMS EXTERNALLY TO THE AFFECTED AREA FOUR TIMES DAILY  . donepezil (ARICEPT) 10 MG tablet Take 1 tablet daily  . gabapentin (NEURONTIN) 100 MG capsule TAKE 1 TO 2 CAPSULES(100 TO 200 MG) BY MOUTH TWICE DAILY  . Glucagon (GVOKE HYPOPEN 2-PACK) 1  MG/0.2ML SOAJ Inject 1 mg into the skin as needed. For Hypo  . glucose blood (ACCU-CHEK AVIVA PLUS) test strip TEST BLOOD SUGAR FOUR TIMES DAILY  . insulin lispro (HUMALOG KWIKPEN) 100 UNIT/ML KwikPen Inject 10 units under the skin at breakfast, 8 units at dinner, and 1-3 units as needed for high blood sugars. (Patient taking  differently: Inject 0-4 units before each meal.)  . lamoTRIgine (LAMICTAL) 100 MG tablet Take 1 tablet (100 mg total) by mouth 2 (two) times daily.  Marland Kitchen lidocaine (LIDODERM) 5 % UNWRAP AND APPLY 1 PATCH TO SKIN EVERY DAY(REMOVE AND DISCARD PATCH WITHIN 12 HOURS OR AS DIRECTED BY DOCTOR)  . lipase/protease/amylase (CREON) 36000 UNITS CPEP capsule Take 2 capsules (72,000 Units total) by mouth 3 (three) times daily before meals.  Marland Kitchen loratadine (CLARITIN) 10 MG tablet Take 10 mg by mouth as needed for allergies.  Marland Kitchen lovastatin (MEVACOR) 40 MG tablet Take 1 tablet (40 mg total) by mouth at bedtime.  . Magnesium 500 MG TABS Take 1 tablet by mouth as needed.  . metoCLOPramide (REGLAN) 5 MG tablet TAKE 1 TABLET THREE TIMES DAILY BEFORE MEALS AS NEEDED  . Multiple Vitamin (MULTIVITAMIN) tablet Take 1 tablet by mouth daily. Reported on 11/22/2015  . Omega-3 Fatty Acids (FISH OIL) 1000 MG CAPS Take 1 capsule by mouth daily.  Marland Kitchen omeprazole (PRILOSEC) 40 MG capsule Take one capsule by mouth daily  . traZODone (DESYREL) 100 MG tablet Take 4 tablets (400 mg total) by mouth at bedtime.  . TRESIBA FLEXTOUCH 100 UNIT/ML SOPN FlexTouch Pen INJECT 15 UNITS INTO THE SKIN DAILY.  . vitamin B-12 (CYANOCOBALAMIN) 1000 MCG tablet Take 1 tablet by mouth daily.  . vitamin E (VITAMIN E) 1000 UNIT capsule Take 1,000 Units by mouth daily.    Marland Kitchen vortioxetine HBr (TRINTELLIX) 5 MG TABS tablet Take 1 tablet (5 mg total) by mouth daily.   No facility-administered encounter medications on file as of 12/25/2019.     Current Diagnosis/Assessment:  Goals Addressed   None     Diabetes (Type 1)   A1c goal < 7%  Recent Relevant Labs: Lab Results  Component Value Date/Time   HGBA1C 7.1 (H) 12/16/2019 11:23 AM   HGBA1C 6.6 (H) 09/16/2019 10:28 AM   MICROALBUR 1.9 09/16/2019 10:28 AM   MICROALBUR 273.4 (H) 01/20/2019 11:27 AM    Last diabetic eye exam:  Lab Results  Component Value Date/Time   HMDIABEYEEXA No Retinopathy  08/11/2019 12:00 AM    Last diabetic foot exam: No results found for: HMDIABFOOTEX   Checking BG: via Freestyle Libre 2.   Patient has failed these meds in past: Lantus, Toujeo,  Patient is currently controlled on the following medications:   Tresiba 8 units daily,   Humalog 1-3 units up to QID,   glucagon injection  Freestyle LIbre  We discussed: Freestyle Libre measures interstitial fluid glucose, not blood so will lag 5-10 minutes behind BG; it is most accurate when BG is stable, not when it is rapidly changing or low. Pt is struggling with high fasting BG in AM because she snacks a lot at night. Discussed healthier snack options.  Plan  Continue current medications and control with diet and exercise    Hyperlipidemia   Lipid Panel     Component Value Date/Time   CHOL 178 06/16/2019 1438   TRIG 45.0 06/16/2019 1438   HDL 80.80 06/16/2019 1438   CHOLHDL 2 06/16/2019 1438   VLDL 9.0 06/16/2019 1438   LDLCALC 89 06/16/2019  1438     The 10-year ASCVD risk score Mikey Bussing DC Jr., et al., 2013) is: 17.8%   Values used to calculate the score:     Age: 6 years     Sex: Female     Is Non-Hispanic African American: No     Diabetic: Yes     Tobacco smoker: Yes     Systolic Blood Pressure: 101 mmHg     Is BP treated: No     HDL Cholesterol: 80.8 mg/dL     Total Cholesterol: 178 mg/dL   Patient has failed these meds in past: simvastatin Patient is currently controlled on the following medications:   lovastatin 40 mg HS,   aspirin 81 mg daily,   fish oil OTC  We discussed:  diet and exercise extensively, adherence to statin. Plan  Continue current medications and control with diet and exercise  Depression/Anxiety   Patient has failed these meds in past: buspirone, escitalopram, hydroxyzine, paroxetine, venlafaxine, Trintellix, bupropion Patient is currently uncontrolled on the following medications:   lamotrigine 100 mg BID,   trazodone 100 mg HS  We  discussed: pt recently failed trial of Trintellix due to headaches. She reports significant and unchanged depressed mood, lack of motivation, fatigue. Discussed benefits of bupropion for these symptoms as well as smoking cessation. She has tried bupropion before but believes it was monotherapy, she may experience additional benefit in combination with lamotrigine.  Plan   Continue current medications  Recommend bupropion XL 150 mg daily, may titrate as tolerated - will discuss with psychiatrist   Tobacco Abuse   Tobacco Status:  Social History   Tobacco Use  Smoking Status Current Every Day Smoker  . Packs/day: 2.00  . Years: 40.00  . Pack years: 80.00  . Types: Cigarettes  Smokeless Tobacco Never Used    Patient smokes Within 30 minutes of waking  reports MOTIVATION to quit is high  reports CONFIDENCE in quitting is low "I have no will power"  Previous quit attempts: tried Nicotine patch, Chantix (weird dreams) Did quit for 2 weeks before cataract surgery once, felt very irritable and on-edge the whole time.  Patient has failed these meds in past: n/a Patient is currently uncontrolled on the following medications:  . No medications  We discussed: previous quit attempts; benefits of bupropion for smoking cessation; Provided contact information for [place] [facility]uit [facility] (1-800-QUIT-NOW) and encouraged patient to reach out to this group for support.  Plan  Contact [place] [facility]uit Line for counseling Recommend bupropion XL 150 mg (see above)   Cognitive impairment   Patient has failed these meds in past: n/a Patient is currently controlled on the following medications:  . Donepezil 10 mg daily  We discussed:  benefits to expect with donepezil - more likely to prevent progression of memory impairment than to improve memory.  Plan  Continue current medications  Medication Management   Pt uses [facility] for all medications Uses pill box? No - organizes bottles Pt endorses  compliance  We discussed: compliance via fill history is poor; pt does endorse compliance; pt would benefit from [employer] services but not ready to switch today  Plan  Continue current medication management strategy    Follow up: 1 month phone visit  Charlene Brooke, PharmD Clinical Pharmacist [facility] at [facility] [phone]

## 2019-12-25 NOTE — Patient Instructions (Addendum)
Visit Information  Phone number for Pharmacist: [phone]  Goals Addressed            This Visit's Progress   . Pharmacy Care Plan       CARE PLAN ENTRY (see longitudinal plan of care for additional care plan information)  Current Barriers:  . Chronic Disease Management support, education, and care coordination needs related to Hyperlipidemia, Diabetes, Depression, Anxiety, and Tobacco use   Hyperlipidemia / Cardiovascular risk reduction Lab Results  Component Value Date/Time   LDLCALC 89 06/16/2019 02:38 PM   LDLDIRECT 101.7 03/26/2012 10:48 AM .  Pharmacist Clinical Goal(s): o Over the next 30 days, patient will work with PharmD and providers to maintain LDL goal < 100 . Current regimen:  o lovastatin 40 mg at bedtime o aspirin 81 mg daily,  o fish oil OTC . Interventions: o Discussed cholesterol goals and benefits of medication for prevention of heart attack / stroke . Patient self care activities - Over the next 30 days, patient will: o Continue medications as prescribed o Reduce cholesterol in diet  Diabetes (Type 1) Lab Results  Component Value Date/Time   HGBA1C 7.1 (H) 12/16/2019 11:23 AM   HGBA1C 6.6 (H) 09/16/2019 10:28 AM .  Pharmacist Clinical Goal(s): o Over the next 30 days, patient will work with PharmD and providers to achieve A1c goal <7% . Current regimen:  o Tresiba 8 units every morning o Humalog 1-3 units with meals and at bedtime o Freestyle Libre 2 . Interventions: o Discussed Freestyle Libre tests interstitial fluid, not blood and lags behind blood sugar readings by about 5-10 minutes; it is also less accurate when sugars are rapidly changing (ie, after eating or after taking insulin) and when sugars are low . Patient self care activities - Over the next 30 days, patient will: o Check blood sugar via [employer], and provide at future appointments o Contact provider with any episodes of hypoglycemia  Depression/Anxiety . Pharmacist  Clinical Goal(s) o Over the next 30 days, patient will work with PharmD and providers to optimize therapy . Current regimen:  o Lamotrigine 100 mg twice a day o Trazodone 100-400 mg at bedtime as needed . Interventions: o Consider addition of bupropion to target de-motivation and avoid weight gain . Patient self care activities - Over the next 30 days, patient will: o Discuss bupropion with psychiatrist  Smoking cessation . Pharmacist Clinical Goal(s) o Over the next 30 days, patient will work with PharmD and providers to reduce cigarette use . Current regimen:  o No medications. Smoking 2 pack-per-day . Interventions: o Discussed benefits of bupropion for smoking cessation o Provided [place] [facility]uit Line number for more resources and counseling . Patient self care activities - Over the next 30 days, patient will: o Contact [place] [facility]uit [facility] (1-800-QUIT-NOW or 5811964924) o Follow up with psychiatrist about bupropion  Medication management . Pharmacist Clinical Goal(s): o Over the next 30 days, patient will work with PharmD and providers to maintain optimal medication adherence . Current pharmacy: [facility] . Interventions o Comprehensive medication review performed. o Continue current medication management strategy . Patient self care activities - Over the next 30 days, patient will: o Focus on medication adherence by fill date o Take medications as prescribed o Report any questions or concerns to PharmD and/or provider(s)  Please see past updates related to this goal by clicking on the "Past Updates" button in the selected goal       The patient verbalized understanding of instructions provided  today and agreed to receive a mailed copy of patient instruction and/or educational materials.  Telephone follow up appointment with pharmacy team member scheduled for: 1 month  Charlene Brooke, PharmD Clinical Pharmacist [facility] at [facility] [phone]   Coping  with Quitting Smoking  Quitting smoking is a physical and mental challenge. You will face cravings, withdrawal symptoms, and temptation. Before quitting, work with your health care provider to make a plan that can help you cope. Preparation can help you quit and keep you from giving in. How can I cope with cravings? Cravings usually last for 5-10 minutes. If you get through it, the craving will pass. Consider taking the following actions to help you cope with cravings:  Keep your mouth busy: ? Chew sugar-free gum. ? Suck on hard candies or a straw. ? Brush your teeth.  Keep your hands and body busy: ? Immediately change to a different activity when you feel a craving. ? Squeeze or play with a ball. ? Do an activity or a hobby, like making bead jewelry, practicing needlepoint, or working with wood. ? Mix up your normal routine. ? Take a short exercise break. Go for a quick walk or run up and down stairs. ? Spend time in public places where smoking is not allowed.  Focus on doing something kind or helpful for someone else.  Call a friend or family member to talk during a craving.  Join a support group.  Call a quit line, such as 1-800-QUIT-NOW.  Talk with your health care provider about medicines that might help you cope with cravings and make quitting easier for you. How can I deal with withdrawal symptoms? Your body may experience negative effects as it tries to get used to not having nicotine in the system. These effects are called withdrawal symptoms. They may include:  Feeling hungrier than normal.  Trouble concentrating.  Irritability.  Trouble sleeping.  Feeling depressed.  Restlessness and agitation.  Craving a cigarette. To manage withdrawal symptoms:  Avoid places, people, and activities that trigger your cravings.  Remember why you want to quit.  Get plenty of sleep.  Avoid coffee and other caffeinated drinks. These may worsen some of your symptoms. How  can I handle social situations? Social situations can be difficult when you are quitting smoking, especially in the first few weeks. To manage this, you can:  Avoid parties, bars, and other social situations where people might be smoking.  Avoid alcohol.  Leave right away if you have the urge to smoke.  Explain to your family and friends that you are quitting smoking. Ask for understanding and support.  Plan activities with friends or family where smoking is not an option. What are some ways I can cope with stress? Wanting to smoke may cause stress, and stress can make you want to smoke. Find ways to manage your stress. Relaxation techniques can help. For example:  Breathe slowly and deeply, in through your nose and out through your mouth.  Listen to soothing, relaxing music.  Talk with a family member or friend about your stress.  Light a candle.  Soak in a bath or take a shower.  Think about a peaceful place. What are some ways I can prevent weight gain? Be aware that many people gain weight after they quit smoking. However, not everyone does. To keep from gaining weight, have a plan in place before you quit and stick to the plan after you quit. Your plan should include:  Having  healthy snacks. When you have a craving, it may help to: ? Eat plain popcorn, crunchy carrots, celery, or other cut vegetables. ? Chew sugar-free gum.  Changing how you eat: ? Eat small portion sizes at meals. ? Eat 4-6 small meals throughout the day instead of 1-2 large meals a day. ? Be mindful when you eat. Do not watch television or do other things that might distract you as you eat.  Exercising regularly: ? Make time to exercise each day. If you do not have time for a long workout, do short bouts of exercise for 5-10 minutes several times a day. ? Do some form of strengthening exercise, like weight lifting, and some form of aerobic exercise, like running or swimming.  Drinking plenty of water  or other low-calorie or no-calorie drinks. Drink 6-8 glasses of water daily, or as much as instructed by your health care provider. Summary  Quitting smoking is a physical and mental challenge. You will face cravings, withdrawal symptoms, and temptation to smoke again. Preparation can help you as you go through these challenges.  You can cope with cravings by keeping your mouth busy (such as by chewing gum), keeping your body and hands busy, and making calls to family, friends, or a helpline for people who want to quit smoking.  You can cope with withdrawal symptoms by avoiding places where people smoke, avoiding drinks with caffeine, and getting plenty of rest.  Ask your health care provider about the different ways to prevent weight gain, avoid stress, and handle social situations. This information is not intended to replace advice given to you by your health care provider. Make sure you discuss any questions you have with your health care provider. Document Revised: 06/01/2017 Document Reviewed: 06/16/2016 Elsevier Patient Education  2020 [employer].

## 2019-12-29 ENCOUNTER — Other Ambulatory Visit: Payer: Self-pay | Admitting: Neurology

## 2020-01-21 ENCOUNTER — Other Ambulatory Visit: Payer: Self-pay

## 2020-01-21 ENCOUNTER — Ambulatory Visit: Payer: Medicare Other | Admitting: Pharmacist

## 2020-01-21 DIAGNOSIS — F17210 Nicotine dependence, cigarettes, uncomplicated: Secondary | ICD-10-CM

## 2020-01-21 DIAGNOSIS — E1065 Type 1 diabetes mellitus with hyperglycemia: Secondary | ICD-10-CM

## 2020-01-21 DIAGNOSIS — F332 Major depressive disorder, recurrent severe without psychotic features: Secondary | ICD-10-CM

## 2020-01-21 DIAGNOSIS — E1069 Type 1 diabetes mellitus with other specified complication: Secondary | ICD-10-CM

## 2020-01-21 NOTE — Chronic Care Management (AMB) (Signed)
Chronic Care Management Pharmacy  Name: Leslie Lane  MRN: [id number] DOB: [birth year]   Chief Complaint/ HPI  Leslie Lane,  64 y.o. , female presents for their Follow-Up CCM visit with the clinical pharmacist via telephone due to COVID-19 Pandemic.  PCP : [name], MD  Their chronic conditions include: Hyperlipidemia, Type 1 Diabetes, COPD and Tobacco use, Bipolar disorder, depression/anxiety, GERD, hx Hepatitis B  Lives with mother, she gives glucagon shot if needed. Brother and 2 sisters, sister visits about once a week.    Fatigue is biggest problem.    Office Visits:  12/16/19 Dr Sharlet Salina OV: f/u depression. Trintellix gave her headaches, stopped after 7 days. Refer back to psych.  06/17/19 Dr Sharlet Salina OV: refilled tramadol, but this will not be chronic rx. Gabapentin is maintenance tx for low back pain. Pt still smoking, not ready to quit.  Consult Visit: 12/19/19 Dr Dwyane Dee (endocrine): not a good candidate for insulin pump. Stop Humalog at dinnertime due to evening lows. Needs simple sugars when she is more active to avoid lows (not sugar-free cookies or peanut butter). Avoid high fat snacks late evening.  11/27/19 Dr Doyne Keel (psych): previous med trial ineffective. Trial Trintellix 5 mg.  09/18/19 Dr Dwyane Dee (endocrine): frequent low sugars, down to 40s, with hypoglycemia unawareness. Low sugar usually related to increased activity in afternoons, she still takes Novolog at lunch. Recs: move [place] to dinnertime, cut back on high-carb snacks at bedtime, eliminate mealtime coverage at lunch, may not need at dinnertime unless significant carb load. Pt to call re: Freestyle Libre 2.   08/28/19 Dr Doyne Keel (psych): pt unwilling to try new meds d/t fear of SE. Finances limited, strict budget.   07/31/19 Dr Anselm Pancoast (neurology): memory loss, started after diabetic coma at 67. [place] 14/30. MRI 01/2019 negative acute changes. Started donepezil and pt reports still having memory  loss. Several pillboxes at home but does not use, sometimes forgets meds. Ordered home sleep study.   06/18/19 Dr Dwyane Dee (endocrine): labile BG although A1c is controlled. Cannot tolerate Toujeo d/t headache, bloating, nausea. Significant issues with hypoglycemia. Thinking about Freestyle Libre 2 due to low alert capability. Would not be good candidate for insulin pump.  06/17/19 Dr Tamala Julian (sports medicine): trigger finger, receiving prn injections.  05/15/19 Dr Doyne Keel (psych): discussed other med options, pt declined all options d/t cost or fear of SE, though about low-dose Ritalin but pt declined, pt stated "I guess I will just stay feeling like I am feeling".   Medications: Outpatient Encounter Medications as of 01/21/2020  Medication Sig Note  . ACCU-CHEK SOFTCLIX LANCETS lancets Use as instructed to check blood sugar 4 times per day dx code E10.65 (Patient taking differently: Use as instructed to check blood sugar 8 times per day dx code E10.65)   . Alcohol Swabs (B-D SINGLE USE SWABS REGULAR) PADS USE FOUR TIMES DAILY   . aspirin EC 81 MG tablet Take 81 mg by mouth every morning.   . B-D ULTRAFINE III SHORT PEN 31G X 8 MM MISC USE 1 PEN NEEDLE TO INJECT FOUR TIMES DAILY   . Blood Glucose Calibration (ACCU-CHEK AVIVA) SOLN USE AS DIRECTED   . Blood Glucose Monitoring Suppl (ACCU-CHEK AVIVA PLUS) w/Device KIT USE AS DIRECTED   . diclofenac Sodium (VOLTAREN) 1 % GEL APPLY 2 GRAMS EXTERNALLY TO THE AFFECTED AREA FOUR TIMES DAILY   . donepezil (ARICEPT) 10 MG tablet TAKE 1/2 TABLET BY MOUTH DAILY FOR 2 WEEKS. INCREASE TO 1 TABLET DAILY  AND. CONTINUE   . gabapentin (NEURONTIN) 100 MG capsule TAKE 1 TO 2 CAPSULES(100 TO 200 MG) BY MOUTH TWICE DAILY 01/21/2020: Taking as needed  . Glucagon (GVOKE HYPOPEN 2-PACK) 1 MG/0.2ML SOAJ Inject 1 mg into the skin as needed. For Hypo   . glucose blood (ACCU-CHEK AVIVA PLUS) test strip TEST BLOOD SUGAR FOUR TIMES DAILY   . insulin lispro (HUMALOG KWIKPEN) 100  UNIT/ML KwikPen Inject 10 units under the skin at breakfast, 8 units at dinner, and 1-3 units as needed for high blood sugars. (Patient taking differently: Inject 0-4 units before each meal.)   . lamoTRIgine (LAMICTAL) 100 MG tablet Take 1 tablet (100 mg total) by mouth 2 (two) times daily.   Marland Kitchen lidocaine (LIDODERM) 5 % UNWRAP AND APPLY 1 PATCH TO SKIN EVERY DAY(REMOVE AND DISCARD PATCH WITHIN 12 HOURS OR AS DIRECTED BY DOCTOR)   . lipase/protease/amylase (CREON) 36000 UNITS CPEP capsule Take 2 capsules (72,000 Units total) by mouth 3 (three) times daily before meals.   Marland Kitchen loratadine (CLARITIN) 10 MG tablet Take 10 mg by mouth as needed for allergies.   Marland Kitchen lovastatin (MEVACOR) 40 MG tablet Take 1 tablet (40 mg total) by mouth at bedtime.   . Magnesium 500 MG TABS Take 1 tablet by mouth as needed.   . metoCLOPramide (REGLAN) 5 MG tablet TAKE 1 TABLET THREE TIMES DAILY BEFORE MEALS AS NEEDED   . Multiple Vitamin (MULTIVITAMIN) tablet Take 1 tablet by mouth daily. Reported on 11/22/2015   . Omega-3 Fatty Acids (FISH OIL) 1000 MG CAPS Take 1 capsule by mouth daily.   Marland Kitchen omeprazole (PRILOSEC) 40 MG capsule Take one capsule by mouth daily   . traZODone (DESYREL) 100 MG tablet Take 4 tablets (400 mg total) by mouth at bedtime.   . TRESIBA FLEXTOUCH 100 UNIT/ML SOPN FlexTouch Pen INJECT 15 UNITS INTO THE SKIN DAILY.   . vitamin B-12 (CYANOCOBALAMIN) 1000 MCG tablet Take 1 tablet by mouth daily.   . vitamin E (VITAMIN E) 1000 UNIT capsule Take 1,000 Units by mouth daily.     Marland Kitchen vortioxetine HBr (TRINTELLIX) 5 MG TABS tablet Take 1 tablet (5 mg total) by mouth daily. (Patient not taking: Reported on 01/21/2020)    No facility-administered encounter medications on file as of 01/21/2020.     Current Diagnosis/Assessment:  SDOH Interventions     Most Recent Value  SDOH Interventions  Financial Strain Interventions Intervention Not Indicated     Goals Addressed            This Visit's Progress   .  Pharmacy Care Plan   On track    CARE PLAN ENTRY (see longitudinal plan of care for additional care plan information)  Current Barriers:  . Chronic Disease Management support, education, and care coordination needs related to Hyperlipidemia, Diabetes, Depression, Anxiety, and Tobacco use  Hyperlipidemia / Cardiovascular risk reduction Lab Results  Component Value Date/Time   LDLCALC 89 06/16/2019 02:38 PM   LDLDIRECT 101.7 03/26/2012 10:48 AM .  Pharmacist Clinical Goal(s): o Over the next 90 days, patient will work with PharmD and providers to maintain LDL goal < 100 . Current regimen:  o lovastatin 40 mg at bedtime o aspirin 81 mg daily,  o fish oil OTC . Interventions: o Discussed cholesterol goals and benefits of medication for prevention of heart attack / stroke . Patient self care activities - Over the next 90 days, patient will: o Continue medications as prescribed o Reduce cholesterol in diet  Diabetes (Type  1) Lab Results  Component Value Date/Time   HGBA1C 7.1 (H) 12/16/2019 11:23 AM   HGBA1C 6.6 (H) 09/16/2019 10:28 AM .  Pharmacist Clinical Goal(s): o Over the next 30 days, patient will work with PharmD and providers to achieve A1c goal <7% . Current regimen:  o Tresiba 8 units every morning o Humalog 1-3 units with meals o Freestyle Libre 2 . Interventions: o Discussed insulin stacking - taking Humalog (or Novolog) more than once within a 3-4 hour period may lead to low blood sugar . Patient self care activities - Over the next 30 days, patient will: o Check blood sugar via [employer], and provide at future appointments o Contact provider with any episodes of hypoglycemia o Take Humalog 1-3 units with meals. Avoid taking a second dose of Humalog within 3-4 hours   Depression/Anxiety . Pharmacist Clinical Goal(s) o Over the next 30 days, patient will work with PharmD and providers to optimize therapy . Current regimen:  o Lamotrigine 100 mg twice a  day o Trazodone 100-400 mg at bedtime as needed . Interventions: o Consider addition of bupropion to target motivation, avoid weight gain  . Patient self care activities - Over the next 30 days, patient will: o Discuss bupropion with psychiatrist  Smoking cessation . Pharmacist Clinical Goal(s) o Over the next 30 days, patient will work with PharmD and providers to reduce cigarette use . Current regimen:  o No medications. Smoking 2 pack-per-day . Interventions: o Discussed benefits of bupropion for smoking cessation o Provided [place] [facility]uit Line number for more resources and counseling . Patient self care activities - Over the next 30 days, patient will: o Contact [place] [facility]uit [facility] (1-800-QUIT-NOW or 343-630-8260) o Follow up with psychiatrist about bupropion  Medication management . Pharmacist Clinical Goal(s): o Over the next 30 days, patient will work with PharmD and providers to achieve optimal medication adherence . Current pharmacy: [facility] . Interventions o Comprehensive medication review performed. o Utilize [facility] for medication synchronization, packaging and delivery . Patient self care activities - Over the next 30 days, patient will: o Focus on medication adherence by fill date o Take medications as prescribed o Report any questions or concerns to PharmD and/or provider(s)  Please see past updates related to this goal by clicking on the "Past Updates" button in the selected goal        Diabetes (Type 1)   A1c goal < 7%  Recent Relevant Labs: Lab Results  Component Value Date/Time   HGBA1C 7.1 (H) 12/16/2019 11:23 AM   HGBA1C 6.6 (H) 09/16/2019 10:28 AM   MICROALBUR 1.9 09/16/2019 10:28 AM   MICROALBUR 273.4 (H) 01/20/2019 11:27 AM    Last diabetic eye exam:  Lab Results  Component Value Date/Time   HMDIABEYEEXA No Retinopathy 08/11/2019 12:00 AM    Last diabetic foot exam: No results found for: HMDIABFOOTEX   Checking BG: via [place] 2.    Recent BG: 125, 155, 168, 102, 141, 68 BG 24 yesterday, mother had to give Glucagon shot. Pt was at her neighbors house and felt off so had 2 glucose tablets.   Patient has failed these meds in past: Lantus, Toujeo,  Patient is currently controlled on the following medications:   Tresiba 8 units daily - AM  Humalog 1-3 units up to QID,   glucagon injection  Freestyle LIbre  We discussed: Pt had dangerously low BG yesterday (24) and mother had to give glucagon injection. Pt reports it was mostly a normal  day, could not pinpoint a reason for low BG - she denies skipping meals, taking extra insulin, intense physical activity. Pt reports she does sometimes taking Humalog 1-3 units before a meal and another 1-3 unit after a meal if she felt like she ate a lot. Discussed insulin stacking, recommended to avoid 2nd dose of Humalog within 3-4 hrs of each other.  Plan  Continue current medications and control with diet and exercise  Recommended to limit Humalog to 3 times daily with meals and avoid insulin stacking    Hyperlipidemia   LDL goal < 100  Lipid Panel     Component Value Date/Time   CHOL 178 06/16/2019 1438   TRIG 45.0 06/16/2019 1438   HDL 80.80 06/16/2019 1438   CHOLHDL 2 06/16/2019 1438   VLDL 9.0 06/16/2019 1438   LDLCALC 89 06/16/2019 1438   LDLDIRECT 101.7 03/26/2012 1048    The 10-year ASCVD risk score Mikey Bussing DC Jr., et al., 2013) is: 17.8%   Values used to calculate the score:     Age: 30 years     Sex: Female     Is Non-Hispanic African American: No     Diabetic: Yes     Tobacco smoker: Yes     Systolic Blood Pressure: 811 mmHg     Is BP treated: No     HDL Cholesterol: 80.8 mg/dL     Total Cholesterol: 178 mg/dL   Patient has failed these meds in past: simvastatin Patient is currently controlled on the following medications:   lovastatin 40 mg HS,   aspirin 81 mg daily,   fish oil OTC  We discussed:  diet and exercise extensively, adherence to  statin.  Plan  Continue current medications and control with diet and exercise  Depression/Anxiety   Depression screen [facility] 2/9 06/17/2019 03/20/2019 11/27/2017  Decreased Interest 1 0 1  Down, Depressed, Hopeless 1 0 2  PHQ - 2 Score 2 0 3  Altered sleeping 2 - 0  Tired, decreased energy 3 - 2  Change in appetite 0 - 0  Feeling bad or failure about yourself  3 - 2  Trouble concentrating 3 - 3  Moving slowly or fidgety/restless 3 - 3  Suicidal thoughts 1 - 0  PHQ-9 Score 17 - 13  Difficult doing work/chores - - -  Some recent data might be hidden   Patient has failed these meds in past: buspirone, escitalopram, hydroxyzine, paroxetine, venlafaxine, Trintellix, bupropion Patient is currently uncontrolled on the following medications:   lamotrigine 100 mg BID,   trazodone 100 mg HS  We discussed: last visit discussed benefits of bupropion to address lack of motivation/energy; pt will discuss with psychiatrist during appt tomorrow  Plan   Continue current medications  F/U with psychiatrist to discuss bupropion  Tobacco Abuse   Tobacco Status:  Social History   Tobacco Use  Smoking Status Current Every Day Smoker  . Packs/day: 2.00  . Years: 40.00  . Pack years: 80.00  . Types: Cigarettes  Smokeless Tobacco Never Used   Patient smokes Within 30 minutes of waking  reports MOTIVATION to quit is high  reports CONFIDENCE in quitting is low "I have no will power"  Previous quit attempts: tried Nicotine patch, Chantix (weird dreams) Did quit for 2 weeks before cataract surgery once, felt very irritable and on-edge the whole time.  Patient has failed these meds in past: n/a Patient is currently uncontrolled on the following medications:  . No medications  We discussed:  previous quit attempts; benefits of bupropion for smoking cessation; Provided contact information for [place] [facility]uit [facility] (1-800-QUIT-NOW) and encouraged patient to reach out to this group for  support.  Plan  Contact [place] [facility]uit [facility] for counseling Recommend bupropion XL 150 mg (see above)   Medication Management   Pt uses [place] [facility] for all medications Uses pill box? No - organizes bottles Pt endorses compliance  We discussed: Verbal consent obtained for [facility] enhanced pharmacy services (medication synchronization, adherence packaging, delivery coordination). A medication sync plan was created to allow patient to get all medications delivered once every 30 to 90 days per patient preference. Patient understands they have freedom to choose pharmacy and clinical pharmacist will coordinate care between all prescribers and [facility].   Plan  Utilize [facility] for medication synchronization, packaging and delivery    Follow up: 1 month phone visit  Charlene Brooke, PharmD Clinical Pharmacist [facility] [phone]

## 2020-01-21 NOTE — Patient Instructions (Addendum)
Visit Information  Phone number for Pharmacist: [phone]  Goals Addressed            This Visit's Progress   . Pharmacy Care Plan   On track    CARE PLAN ENTRY (see longitudinal plan of care for additional care plan information)  Current Barriers:  . Chronic Disease Management support, education, and care coordination needs related to Hyperlipidemia, Diabetes, Depression, Anxiety, and Tobacco use  Hyperlipidemia / Cardiovascular risk reduction Lab Results  Component Value Date/Time   LDLCALC 89 06/16/2019 02:38 PM   LDLDIRECT 101.7 03/26/2012 10:48 AM .  Pharmacist Clinical Goal(s): o Over the next 90 days, patient will work with PharmD and providers to maintain LDL goal < 100 . Current regimen:  o lovastatin 40 mg at bedtime o aspirin 81 mg daily,  o fish oil OTC . Interventions: o Discussed cholesterol goals and benefits of medication for prevention of heart attack / stroke . Patient self care activities - Over the next 90 days, patient will: o Continue medications as prescribed o Reduce cholesterol in diet  Diabetes (Type 1) Lab Results  Component Value Date/Time   HGBA1C 7.1 (H) 12/16/2019 11:23 AM   HGBA1C 6.6 (H) 09/16/2019 10:28 AM .  Pharmacist Clinical Goal(s): o Over the next 30 days, patient will work with PharmD and providers to achieve A1c goal <7% . Current regimen:  o Tresiba 8 units every morning o Humalog 1-3 units with meals o Freestyle Libre 2 . Interventions: o Discussed insulin stacking - taking Humalog (or Novolog) more than once within a 3-4 hour period may lead to low blood sugar . Patient self care activities - Over the next 30 days, patient will: o Check blood sugar via [employer], and provide at future appointments o Contact provider with any episodes of hypoglycemia o Take Humalog 1-3 units with meals. Avoid taking a second dose of Humalog within 3-4 hours   Depression/Anxiety . Pharmacist Clinical Goal(s) o Over the next 30  days, patient will work with PharmD and providers to optimize therapy . Current regimen:  o Lamotrigine 100 mg twice a day o Trazodone 100-400 mg at bedtime as needed . Interventions: o Consider addition of bupropion to target motivation, avoid weight gain  . Patient self care activities - Over the next 30 days, patient will: o Discuss bupropion with psychiatrist  Smoking cessation . Pharmacist Clinical Goal(s) o Over the next 30 days, patient will work with PharmD and providers to reduce cigarette use . Current regimen:  o No medications. Smoking 2 pack-per-day . Interventions: o Discussed benefits of bupropion for smoking cessation o Provided [facility] number for more resources and counseling . Patient self care activities - Over the next 30 days, patient will: o Contact [facility] (1-800-QUIT-NOW or (318)323-7183) o Follow up with psychiatrist about bupropion  Medication management . Pharmacist Clinical Goal(s): o Over the next 30 days, patient will work with PharmD and providers to achieve optimal medication adherence . Current pharmacy: [facility] . Interventions o Comprehensive medication review performed. o Utilize [facility] for medication synchronization, packaging and delivery . Patient self care activities - Over the next 30 days, patient will: o Focus on medication adherence by fill date o Take medications as prescribed o Report any questions or concerns to PharmD and/or provider(s)  Please see past updates related to this goal by clicking on the "Past Updates" button in the selected goal       The patient verbalized understanding of instructions  provided today and agreed to receive a mailed copy of patient instruction and/or educational materials.  Telephone follow up appointment with pharmacy team member scheduled for: 1 month  Charlene Brooke, PharmD Clinical Pharmacist [employer] [facility] [phone]  Preventing  Hypoglycemia Hypoglycemia occurs when the level of sugar (glucose) in the blood is too low. Hypoglycemia can happen in people who do or do not have diabetes (diabetes mellitus). It can develop quickly, and it can be a medical emergency. For most people with diabetes, a blood glucose level below 70 mg/dL (3.9 mmol/L) is considered hypoglycemia. Glucose is a type of sugar that provides the body's main source of energy. Certain hormones (insulin and glucagon) control the level of glucose in the blood. Insulin lowers blood glucose, and glucagon increases blood glucose. Hypoglycemia can result from having too much insulin in the bloodstream, or from not eating enough food that contains glucose. Your risk for hypoglycemia is higher:  If you take insulin or diabetes medicines to help lower your blood glucose or help your body make more insulin.  If you skip or delay a meal or snack.  If you are ill.  During and after exercise. You can prevent hypoglycemia by working with your health care provider to adjust your meal plan as needed and by taking other precautions. How can hypoglycemia affect me? Mild symptoms Mild hypoglycemia may not cause any symptoms. If you do have symptoms, they may include:  Hunger.  Anxiety.  Sweating and feeling clammy.  Dizziness or feeling light-headed.  Sleepiness.  Nausea.  Increased heart rate.  Headache.  Blurry vision.  Irritability.  Tingling or numbness around the mouth, lips, or tongue.  A change in coordination.  Restless sleep. If mild hypoglycemia is not recognized and treated, it can quickly become moderate or severe hypoglycemia. Moderate symptoms Moderate hypoglycemia can cause:  Mental confusion and poor judgment.  Behavior changes.  Weakness.  Irregular heartbeat. Severe symptoms Severe hypoglycemia is a medical emergency. It can cause:  Fainting.  Seizures.  Loss of consciousness (coma).  Death. What nutrition changes  can be made?  Work with your health care provider or diet and nutrition specialist (dietitian) to make a healthy meal plan that is right for you. Follow your meal plan carefully.  Eat meals at regular times.  If recommended by your health care provider, have snacks between meals.  Donot skip or delay meals or snacks. You can be at risk for hypoglycemia if you are not getting enough carbohydrates. What lifestyle changes can be made?   Work closely with your health care provider to manage your blood glucose. Make sure you know: ? Your goal blood glucose levels. ? How and when to check your blood glucose. ? The symptoms of hypoglycemia. It is important to treat it right away to keep it from becoming severe.  Do not drink alcohol on an empty stomach.  When you are ill, check your blood glucose more often than usual. Follow your sick day plan whenever you cannot eat or drink normally. Make this plan in advance with your health care provider.  Always check your blood glucose before, during, and after exercise. How is this treated? This condition can often be treated by immediately eating or drinking something that contains sugar, such as:  Fruit juice, 4-6 oz (120-150 mL).  Regular (not diet) soda, 4-6 oz (120-150 mL).  Low-fat milk, 4 oz (120 mL).  Several pieces of hard candy.  Sugar or honey, 1 Tbsp (15  mL). Treating hypoglycemia if you have diabetes If you are alert and able to swallow safely, follow the 15:15 rule:  Take 15 grams of a rapid-acting carbohydrate. Talk with your health care provider about how much you should take.  Rapid-acting options include: ? Glucose pills (take 15 grams). ? 6-8 pieces of hard candy. ? 4-6 oz (120-150 mL) of fruit juice. ? 4-6 oz (120-150 mL) of regular (not diet) soda.  Check your blood glucose 15 minutes after you take the carbohydrate.  If the repeat blood glucose level is still at or below 70 mg/dL (3.9 mmol/L), take 15 grams of a  carbohydrate again.  If your blood glucose level does not increase above 70 mg/dL (3.9 mmol/L) after 3 tries, seek emergency medical care.  After your blood glucose level returns to normal, eat a meal or a snack within 1 hour. Treating severe hypoglycemia Severe hypoglycemia is when your blood glucose level is at or below 54 mg/dL (3 mmol/L). Severe hypoglycemia is a medical emergency. Get medical help right away. If you have severe hypoglycemia and you cannot eat or drink, you may need an injection of glucagon. A family member or close friend should learn how to check your blood glucose and how to give you a glucagon injection. Ask your health care provider if you need to have an emergency glucagon injection kit available. Severe hypoglycemia may need to be treated in a hospital. The treatment may include getting glucose through an IV. You may also need treatment for the cause of your hypoglycemia. Where to find more information  American Diabetes Association: www.diabetes.[employer] of Diabetes and Digestive and Kidney Diseases: [employer] Contact a health care provider if:  You have problems keeping your blood glucose in your target range.  You have frequent episodes of hypoglycemia. Get help right away if:  You continue to have hypoglycemia symptoms after eating or drinking something containing glucose.  Your blood glucose level is at or below 54 mg/dL (3 mmol/L).  You faint.  You have a seizure. These symptoms may represent a serious problem that is an emergency. Do not wait to see if the symptoms will go away. Get medical help right away. Call your local emergency services (911 in the [place] Summary  Know the symptoms of hypoglycemia, and when you are at risk for it (such as during exercise or when you are sick). Check your blood glucose often when you are at risk for hypoglycemia.  Hypoglycemia can develop quickly, and it can be dangerous if it is not  treated right away. If you have a history of severe hypoglycemia, make sure you know how to use your glucagon injection kit.  Make sure you know how to treat hypoglycemia. Keep a carbohydrate snack available when you may be at risk for hypoglycemia. This information is not intended to replace advice given to you by your health care provider. Make sure you discuss any questions you have with your health care provider. Document Revised: 10/11/2018 Document Reviewed: 02/14/2017 Elsevier Patient Education  [place].

## 2020-01-22 ENCOUNTER — Telehealth (INDEPENDENT_AMBULATORY_CARE_PROVIDER_SITE_OTHER): Payer: Medicare Other | Admitting: Psychiatry

## 2020-01-22 ENCOUNTER — Telehealth: Payer: Self-pay | Admitting: Endocrinology

## 2020-01-22 ENCOUNTER — Other Ambulatory Visit: Payer: Self-pay | Admitting: Neurology

## 2020-01-22 ENCOUNTER — Other Ambulatory Visit: Payer: Self-pay

## 2020-01-22 ENCOUNTER — Encounter (HOSPITAL_COMMUNITY): Payer: Self-pay | Admitting: Psychiatry

## 2020-01-22 DIAGNOSIS — G4701 Insomnia due to medical condition: Secondary | ICD-10-CM | POA: Diagnosis not present

## 2020-01-22 DIAGNOSIS — F332 Major depressive disorder, recurrent severe without psychotic features: Secondary | ICD-10-CM | POA: Diagnosis not present

## 2020-01-22 DIAGNOSIS — F4010 Social phobia, unspecified: Secondary | ICD-10-CM | POA: Diagnosis not present

## 2020-01-22 MED ORDER — LAMOTRIGINE 100 MG PO TABS: 100.0000 mg | ORAL_TABLET | Freq: Two times a day (BID) | ORAL | 0 refills | Status: DC

## 2020-01-22 MED ORDER — BUPROPION HCL ER (XL) 150 MG PO TB24
150.0000 mg | ORAL_TABLET | ORAL | 0 refills | Status: DC
Start: 2020-01-22 — End: 2020-04-15

## 2020-01-22 MED ORDER — GVOKE HYPOPEN 2-PACK 1 MG/0.2ML ~~LOC~~ SOAJ: 1.0000 mg | SUBCUTANEOUS | 1 refills | Status: DC | PRN

## 2020-01-22 MED ORDER — TRAZODONE HCL 100 MG PO TABS: 400.0000 mg | ORAL_TABLET | Freq: Every day | ORAL | 0 refills | Status: DC

## 2020-01-22 NOTE — Telephone Encounter (Signed)
Sherry RN with [facility] ph# [phone] called [facility] contacted [facility] to state they request a new RX/Order for Glucon Hypo Pen to be sent to:  [facility] - [place], [place] - [address] Phone:  [phone]  Fax:  [phone]

## 2020-01-22 NOTE — Progress Notes (Signed)
Virtual Visit via Telephone Note  I connected with Leslie Lane on 01/22/20 at 10:00 AM EDT by telephone and verified that I am speaking with the correct person using two identifiers.  Location: Patient: home Provider: office   I discussed the limitations, risks, security and privacy concerns of performing an evaluation and management service by telephone and the availability of in person appointments. I also discussed with the patient that there may be a patient responsible charge related to this service. The patient expressed understanding and agreed to proceed.   History of Present Illness: Leslie Lane reports that nothing has changed. She is still very anxious and depressed. She has no motivation to do anything and spends most of her time watching tv or lying in bed. Leslie Lane has poor concentration and can't focus enough to read. She is isolating. Leslie Lane says she can hardly stand herself. Leslie Lane reports passive thoughts of death but denies SI/HI. She stopped the Trintellix due to fatigue. Leslie Lane is smoking 2 packs of cigarettes/day and feels ready to quit.    Observations/Objective:  General Appearance: unable to assess  Eye Contact:  unable to assess  Speech:  Clear and Coherent and Normal Rate  Volume:  Normal  Mood:  Anxious and Depressed  Affect:  Congruent  Thought Process:  Coherent, Linear and Descriptions of Associations: Intact  Orientation:  Full (Time, Place, and Person)  Thought Content:  Logical  Suicidal Thoughts:  No  Homicidal Thoughts:  No  Memory:  Immediate;   Fair  Judgement:  Fair  Insight:  Fair  Psychomotor Activity: unable to assess  Concentration:  Concentration: Fair  Recall:  [employer] of Knowledge:  Fair  Language:  Fair  Akathisia:  unable to assess  Handed:  Right  AIMS (if indicated):     Assets:  Desire for Improvement Financial Resources/Insurance Housing Talents/Skills Transportation Vocational/Educational  ADL's:  unable to assess  Cognition:   WNL  Sleep:        I reviewed the information below on 01/22/20 and have updated it  Assessment and Plan:  MDD-recurrent, severe without psychotic features; GAD; Social anxiety d/o; Insomnia; Nicotine use disorder    She is unable to afford [place]   Previous med trial were ineffective   Her finances are limited and she is on a strict budget.   Lamictal 100mg  po BID for MDD   Trazodone 400mg  po qHS for MDD and insomnia   D/c Trintellix due to fatigue  Start trial of Wellbutrin XL 150mg  po qD for depression and smoking cessation. Pt reports hx of falling and confusion when hypoglycemic but otherwise denies seizures and eating disorders.  Referred 1-800-quitline for smoking cessation support  Follow Up Instructions: In 2-3 months or sooner if needed   I discussed the assessment and treatment plan with the patient. The patient was provided an opportunity to ask questions and all were answered. The patient agreed with the plan and demonstrated an understanding of the instructions.   The patient was advised to call back or seek an in-person evaluation if the symptoms worsen or if the condition fails to improve as anticipated.  I provided 20 minutes of non-face-to-face time during this encounter.   Charlcie Cradle, MD

## 2020-01-22 NOTE — Telephone Encounter (Signed)
Rx sent 

## 2020-01-28 ENCOUNTER — Other Ambulatory Visit: Payer: Self-pay

## 2020-01-28 MED ORDER — ACCU-CHEK AVIVA VI SOLN: 1 refills | Status: AC

## 2020-01-28 MED ORDER — INSULIN LISPRO (1 UNIT DIAL) 100 UNIT/ML (KWIKPEN): PEN_INJECTOR | SUBCUTANEOUS | 1 refills | Status: DC

## 2020-01-28 MED ORDER — ACCU-CHEK SOFTCLIX LANCETS MISC: 1 refills | Status: AC

## 2020-01-28 MED ORDER — METOCLOPRAMIDE HCL 5 MG PO TABS: ORAL_TABLET | ORAL | 0 refills | Status: DC

## 2020-01-28 MED ORDER — TRESIBA FLEXTOUCH 100 UNIT/ML ~~LOC~~ SOPN: PEN_INJECTOR | SUBCUTANEOUS | 3 refills | Status: DC

## 2020-01-28 MED ORDER — ACCU-CHEK AVIVA PLUS VI STRP: ORAL_STRIP | 2 refills | Status: DC

## 2020-01-28 MED ORDER — BD PEN NEEDLE SHORT U/F 31G X 8 MM MISC: 3 refills | Status: DC

## 2020-01-28 MED ORDER — BD SWAB SINGLE USE REGULAR PADS: MEDICATED_PAD | 2 refills | Status: AC

## 2020-01-28 MED ORDER — GVOKE HYPOPEN 2-PACK 1 MG/0.2ML ~~LOC~~ SOAJ: 1.0000 mg | SUBCUTANEOUS | 1 refills | Status: DC | PRN

## 2020-02-10 ENCOUNTER — Telehealth: Payer: Self-pay | Admitting: Pharmacist

## 2020-02-10 ENCOUNTER — Telehealth: Payer: Self-pay

## 2020-02-10 MED ORDER — OMEPRAZOLE 40 MG PO CPDR: DELAYED_RELEASE_CAPSULE | ORAL | 1 refills | Status: DC

## 2020-02-10 MED ORDER — LOVASTATIN 40 MG PO TABS: 40.0000 mg | ORAL_TABLET | Freq: Every day | ORAL | 0 refills | Status: DC

## 2020-02-10 MED ORDER — LIDOCAINE 5 % EX PTCH: MEDICATED_PATCH | CUTANEOUS | 6 refills | Status: DC

## 2020-02-10 NOTE — Progress Notes (Signed)
Chronic Care Management Pharmacy Assistant   Name: Leslie Lane  MRN: [id number] DOB: 07-03-1956  Reason for Encounter: Medication Review  Patient Questions:  1.  Have you seen any other providers since your last visit? No  2.  Any changes in your medicines or health? No    PCP : [name], MD  Allergies:   Allergies  Allergen Reactions  . Penicillins Anaphylaxis  . Sulfonamide Derivatives Anaphylaxis    Medications: Outpatient Encounter Medications as of 02/10/2020  Medication Sig Note  . Accu-Chek Softclix Lancets lancets Use as instructed to check blood sugar 4 times per day dx code E10.65   . Alcohol Swabs (B-D SINGLE USE SWABS REGULAR) PADS USE FOUR TIMES DAILY   . aspirin EC 81 MG tablet Take 81 mg by mouth every morning.   . Blood Glucose Calibration (ACCU-CHEK AVIVA) SOLN USE AS DIRECTED   . Blood Glucose Monitoring Suppl (ACCU-CHEK AVIVA PLUS) w/Device KIT USE AS DIRECTED   . buPROPion (WELLBUTRIN XL) 150 MG 24 hr tablet Take 1 tablet (150 mg total) by mouth every morning.   . diclofenac Sodium (VOLTAREN) 1 % GEL APPLY 2 GRAMS EXTERNALLY TO THE AFFECTED AREA FOUR TIMES DAILY   . donepezil (ARICEPT) 10 MG tablet TAKE 1/2 TABLET BY MOUTH DAILY FOR 2 WEEKS. INCREASE TO 1 TABLET DAILY AND. CONTINUE   . gabapentin (NEURONTIN) 100 MG capsule TAKE 1 TO 2 CAPSULES(100 TO 200 MG) BY MOUTH TWICE DAILY 01/21/2020: Taking as needed  . Glucagon (GVOKE HYPOPEN 2-PACK) 1 MG/0.2ML SOAJ Inject 1 mg into the skin as needed. For Hypo   . glucose blood (ACCU-CHEK AVIVA PLUS) test strip TEST BLOOD SUGAR FOUR TIMES DAILY   . insulin degludec (TRESIBA FLEXTOUCH) 100 UNIT/ML FlexTouch Pen INJECT 15 UNITS INTO THE SKIN DAILY.   Marland Kitchen insulin lispro (HUMALOG KWIKPEN) 100 UNIT/ML KwikPen Inject 10 units under the skin at breakfast, 8 units at dinner, and 1-3 units as needed for high blood sugars.   . Insulin Pen Needle (B-D ULTRAFINE III SHORT PEN) 31G X 8 MM MISC Use as instructed to  inject insulin 3 times daily.   Marland Kitchen lamoTRIgine (LAMICTAL) 100 MG tablet Take 1 tablet (100 mg total) by mouth 2 (two) times daily.   Marland Kitchen lidocaine (LIDODERM) 5 % UNWRAP AND APPLY 1 PATCH TO SKIN EVERY DAY(REMOVE AND DISCARD PATCH WITHIN 12 HOURS OR AS DIRECTED BY DOCTOR)   . lipase/protease/amylase (CREON) 36000 UNITS CPEP capsule Take 2 capsules (72,000 Units total) by mouth 3 (three) times daily before meals.   Marland Kitchen loratadine (CLARITIN) 10 MG tablet Take 10 mg by mouth as needed for allergies.   Marland Kitchen lovastatin (MEVACOR) 40 MG tablet Take 1 tablet (40 mg total) by mouth at bedtime.   . Magnesium 500 MG TABS Take 1 tablet by mouth as needed.   . metoCLOPramide (REGLAN) 5 MG tablet TAKE 1 TABLET THREE TIMES DAILY BEFORE MEALS AS NEEDED   . Multiple Vitamin (MULTIVITAMIN) tablet Take 1 tablet by mouth daily. Reported on 11/22/2015   . Omega-3 Fatty Acids (FISH OIL) 1000 MG CAPS Take 1 capsule by mouth daily.   Marland Kitchen omeprazole (PRILOSEC) 40 MG capsule Take one capsule by mouth daily   . traZODone (DESYREL) 100 MG tablet Take 4 tablets (400 mg total) by mouth at bedtime.   . vitamin B-12 (CYANOCOBALAMIN) 1000 MCG tablet Take 1 tablet by mouth daily.   . vitamin E (VITAMIN E) 1000 UNIT capsule Take 1,000 Units by mouth  daily.      No facility-administered encounter medications on file as of 02/10/2020.    Current Diagnosis: Patient Active Problem List   Diagnosis Date Noted  . Other sprain of right middle finger, initial encounter 06/17/2019  . Acquired trigger finger of left ring finger 02/18/2019  . Right shoulder pain 02/18/2018  . Protein calorie malnutrition (HCC) 06/01/2017  . Routine general medical examination at a health care facility 06/01/2017  . WBC decreased 03/27/2017  . Diabetic gastroparesis associated with type 1 diabetes mellitus (HCC) 10/24/2016  . Abdominal pain 02/28/2016  . Insomnia 04/13/2015  . Major depressive disorder, recurrent, severe without psychotic features (HCC)  07/28/2014  . Generalized anxiety disorder 04/27/2014  . Abnormal CT scan, bladder   . HEPATITIS B 07/05/2010  . Mixed diabetic hyperlipidemia associated with type 1 diabetes mellitus (HCC) 07/05/2010  . BIPOLAR AFFECTIVE DISORDER 07/05/2010  . Smokers' cough (HCC) 07/05/2010  . MIGRAINE HEADACHE 07/05/2010  . GERD 07/05/2010  . LOW BACK PAIN, CHRONIC 07/05/2010    Goals Addressed   None     Follow-Up:  [facility]ination of [facility]    Received call from patient regarding medication management via [facility].  Patient requested an acute fill for Ultra-Fine Short pen needles            Accu-Chek Test Aviva Test Strips                                                         Lidocaine 5% Topical Patch                                                         Omeprazole 40mg    to be delivered: 02/11/2020 Pharmacy needs refills? No  Confirmed delivery date of 02/11/2020, advised patient that pharmacy will contact them the morning of delivery.  Deven Crosby               

## 2020-02-10 NOTE — Telephone Encounter (Signed)
-----   Message from [place], [facility] sent at 02/10/2020  1:53 PM EDT ----- Regarding: Med refills Patient is switching to [facility] delivery, can you order refills there?  Lovastatin 40 mg Lidocaine 5% patch Omeprazole 40 mg

## 2020-02-17 ENCOUNTER — Other Ambulatory Visit (INDEPENDENT_AMBULATORY_CARE_PROVIDER_SITE_OTHER): Payer: Medicare Other

## 2020-02-17 ENCOUNTER — Other Ambulatory Visit: Payer: Self-pay

## 2020-02-17 DIAGNOSIS — E1065 Type 1 diabetes mellitus with hyperglycemia: Secondary | ICD-10-CM

## 2020-02-17 LAB — GLUCOSE, RANDOM: Glucose, Bld: 134 mg/dL — ABNORMAL HIGH (ref 70–99)

## 2020-02-17 LAB — HEMOGLOBIN A1C: Hgb A1c MFr Bld: 6.4 % (ref 4.6–6.5)

## 2020-02-19 ENCOUNTER — Other Ambulatory Visit: Payer: Self-pay

## 2020-02-19 ENCOUNTER — Encounter: Payer: Self-pay | Admitting: Endocrinology

## 2020-02-19 ENCOUNTER — Ambulatory Visit (INDEPENDENT_AMBULATORY_CARE_PROVIDER_SITE_OTHER): Payer: Medicare Other | Admitting: Endocrinology

## 2020-02-19 VITALS — BP 130/60 | HR 79 | Ht 64.0 in | Wt 109.0 lb

## 2020-02-19 DIAGNOSIS — E7800 Pure hypercholesterolemia, unspecified: Secondary | ICD-10-CM

## 2020-02-19 DIAGNOSIS — E1065 Type 1 diabetes mellitus with hyperglycemia: Secondary | ICD-10-CM

## 2020-02-19 NOTE — Patient Instructions (Addendum)
STOP NovoLog completely at dinnertime  Use 2 units only if sugar > 200  Call if getting low on waking up

## 2020-02-19 NOTE — Progress Notes (Signed)
Patient ID: Leslie Lane, female   DOB: Apr 21, 1956, 64 y.o.   MRN: [id number]  Reason for Appointment: Endocrinology follow-up   History of Present Illness    Diagnosis: Type 1 DIABETES MELITUS, diagnosed 1967      She has had labile blood sugar control over the last several years even though A1c has been usually around 7% She has had less lability and hypoglycemia with taking b.i.d. Lantus compared once a day She has been very sensitive to fast acting insulin and frequently does not require mealtime coverage She cannot tolerate Toujeo because of reported episode of headache, bloating and nausea with the first dose  RECENT history:  Insulin regimen: Tresiba 8 units daily, Novolog 1-2 units up to 3 times a day   Her A1c is now 6.4  Current blood sugar patterns and problems identified:  She has been using the freestyle libre but this appears to be frequently reading falsely low especially with identifying hypoglycemia  She was told to not take any NovoLog at dinnertime but she forgot and is still taking 1-2 units  She is again tending to be more active in the evenings with various activities which brings her blood sugar down and the lowest blood sugars are between 8-10 PM  She is still tending to have snack at bedtime which may be after midnight, sometimes high-fat snacks like Cheetos.  She does her groceries herself and does not restrict her food choices much  She still takes 1 to 2 units of NovoLog with every meal but will still tend to have occasional low sugars after breakfast or before lunch  Recently when her blood sugar was 155 she took 2 units of NovoLog and subsequently blood sugar was 34  She said she had a severe hypoglycemic episode and fell down about 6 weeks ago  Hypoglycemia: Symptoms may be absent with low blood sugars and does not have early warning symptoms. Gets confused and she frequently depends on her mother to recognize low sugars and treat  them.  Her mother knows how to give Glucagon injection  She will treat her low blood sugars usually with juice, does carry glucose tablets with her      DIET: She is trying to get protein with eggs at breakfast Meal times: Variable.Marland Kitchen  She will eat more when she is anxious  CGM use % of time  91  2-week average/SD  132, GV 39  Time in range      69%  % Time Above 180  20  % Time above 250   % Time Below 70  11     PRE-MEAL  6-8 AM Lunch Dinner Bedtime Overall  Glucose range:  44-216    100-271   Averages:  174  124  117  83    POST-MEAL PC Breakfast PC Lunch PC Dinner  Glucose range:     Averages:  128  140  94     Data from 6/21:   CGM use % of time  94  Average and SD  137, GV 36  Time in range     72   %  % Time Above 180  18  % Time above 250 2  % Time Below target  8    PRE-MEAL Fasting Lunch Dinner Bedtime Overall  Glucose range:       Mean/median:  172  152  109  90  137   POST-MEAL PC Breakfast PC Lunch PC Dinner  Glucose  range:     Mean/median:  148  126  104    Glycemic patterns summary: Blood sugars are averaging about 120 at midnight, gradually increasing to the highest level around 8 AM and then gradually declining to the lowest level around 9 PM. Hypoglycemia is mostly after 6 PM     Wt Readings from Last 3 Encounters:  02/19/20 109 lb (49.4 kg)  12/19/19 109 lb (49.4 kg)  12/16/19 109 lb (49.4 kg)    Lab Results  Component Value Date   HGBA1C 6.4 02/17/2020   HGBA1C 7.1 (H) 12/16/2019   HGBA1C 6.6 (H) 09/16/2019   Lab Results  Component Value Date   MICROALBUR 1.9 09/16/2019   LDLCALC 89 06/16/2019   CREATININE 0.77 12/16/2019        Allergies as of 02/19/2020      Reactions   Penicillins Anaphylaxis   Sulfonamide Derivatives Anaphylaxis      Medication List       Accurate as of February 19, 2020  1:18 PM. If you have any questions, ask your nurse or doctor.        Accu-Chek Aviva Plus test strip Generic drug: glucose  blood TEST BLOOD SUGAR FOUR TIMES DAILY   Accu-Chek Aviva Plus w/Device Kit USE AS DIRECTED   Accu-Chek Aviva Soln USE AS DIRECTED   Accu-Chek Softclix Lancets lancets Use as instructed to check blood sugar 4 times per day dx code E10.65   aspirin EC 81 MG tablet Take 81 mg by mouth every morning.   B-D SINGLE USE SWABS REGULAR Pads USE FOUR TIMES DAILY   B-D ULTRAFINE III SHORT PEN 31G X 8 MM Misc Generic drug: Insulin Pen Needle Use as instructed to inject insulin 3 times daily.   buPROPion 150 MG 24 hr tablet Commonly known as: Wellbutrin XL Take 1 tablet (150 mg total) by mouth every morning.   diclofenac Sodium 1 % Gel Commonly known as: VOLTAREN APPLY 2 GRAMS EXTERNALLY TO THE AFFECTED AREA FOUR TIMES DAILY   donepezil 10 MG tablet Commonly known as: ARICEPT TAKE 1/2 TABLET BY MOUTH DAILY FOR 2 WEEKS. INCREASE TO 1 TABLET DAILY AND. CONTINUE   Fish Oil 1000 MG Caps Take 1 capsule by mouth daily.   gabapentin 100 MG capsule Commonly known as: NEURONTIN TAKE 1 TO 2 CAPSULES(100 TO 200 MG) BY MOUTH TWICE DAILY   Gvoke HypoPen 2-Pack 1 MG/0.2ML Soaj Generic drug: Glucagon Inject 1 mg into the skin as needed. For Hypo   insulin lispro 100 UNIT/ML KwikPen Commonly known as: HumaLOG KwikPen Inject 10 units under the skin at breakfast, 8 units at dinner, and 1-3 units as needed for high blood sugars.   lamoTRIgine 100 MG tablet Commonly known as: LAMICTAL Take 1 tablet (100 mg total) by mouth 2 (two) times daily.   lidocaine 5 % Commonly known as: LIDODERM UNWRAP AND APPLY 1 PATCH TO SKIN EVERY DAY(REMOVE AND DISCARD PATCH WITHIN 12 HOURS OR AS DIRECTED BY DOCTOR)   lipase/protease/amylase 36000 UNITS Cpep capsule Commonly known as: Creon Take 2 capsules (72,000 Units total) by mouth 3 (three) times daily before meals.   loratadine 10 MG tablet Commonly known as: CLARITIN Take 10 mg by mouth as needed for allergies.   lovastatin 40 MG tablet Commonly  known as: MEVACOR Take 1 tablet (40 mg total) by mouth at bedtime.   Magnesium 500 MG Tabs Take 1 tablet by mouth as needed.   metoCLOPramide 5 MG tablet Commonly known as: REGLAN TAKE  1 TABLET THREE TIMES DAILY BEFORE MEALS AS NEEDED   multivitamin tablet Take 1 tablet by mouth daily. Reported on 11/22/2015   omeprazole 40 MG capsule Commonly known as: PRILOSEC Take one capsule by mouth daily   traZODone 100 MG tablet Commonly known as: DESYREL Take 4 tablets (400 mg total) by mouth at bedtime.   Tyler Aas FlexTouch 100 UNIT/ML FlexTouch Pen Generic drug: insulin degludec INJECT 15 UNITS INTO THE SKIN DAILY.   vitamin B-12 1000 MCG tablet Commonly known as: CYANOCOBALAMIN Take 1 tablet by mouth daily.   vitamin E 1000 UNIT capsule Generic drug: vitamin E Take 1,000 Units by mouth daily.       Allergies:  Allergies  Allergen Reactions  . Penicillins Anaphylaxis  . Sulfonamide Derivatives Anaphylaxis    Past Medical History:  Diagnosis Date  . ALLERGIC RHINITIS   . Allergy   . Anemia   . Anxiety   . Arthritis   . BIPOLAR AFFECTIVE DISORDER   . Cataract   . DEPRESSION   . Diabetes mellitus type I ([place])   . DIABETES MELLITUS, TYPE II    follows with endo  . Fibromyalgia   . GERD   . HEPATITIS B   . HYPERLIPIDEMIA   . LOW BACK PAIN, CHRONIC   . MIGRAINE HEADACHE   . NARCOTIC ABUSE    hx of  . NECK PAIN, CHRONIC   . Neuropathy   . Seizures ([place])    pt states, "if my blood sugar drops to the 20's, I convulse.  It hasn't happened in a long time."  . SMOKER     Past Surgical History:  Procedure Laterality Date  . CESAREAN SECTION    . COLONOSCOPY    . TUBAL LIGATION    . UPPER GASTROINTESTINAL ENDOSCOPY      Family History  Problem Relation Age of Onset  . Arthritis Mother   . Colon cancer Mother        ? age of dx  . Arthritis Father   . Kidney disease Father   . Kidney cancer Father   . Heart attack Brother   . Bladder Cancer Sister   .  Hyperlipidemia Other   . Kidney cancer Paternal Aunt   . Anxiety disorder Neg Hx   . Bipolar disorder Neg Hx   . Depression Neg Hx   . Breast cancer Neg Hx   . Esophageal cancer Neg Hx   . Stomach cancer Neg Hx   . Rectal cancer Neg Hx     Social History:  reports that she has been smoking cigarettes. She has a 80.00 pack-year smoking history. She has never used smokeless tobacco. She reports current alcohol use. She reports that she does not use drugs.  Review of Systems:   Had gastroparesis treated with Reglan, recently has no nausea  NEUROPATHY: She has burning in her legs and feet at times treated with gabapentin 100 mg   DEPRESSION: She has had long-standing depression and anxiety on long-term treatment   HYPERLIPIDEMIA: The lipid abnormality consists of elevated LDL   Is  on lovastatin 40 mg with  good control   Lab Results  Component Value Date   CHOL 178 06/16/2019   HDL 80.80 06/16/2019   LDLCALC 89 06/16/2019   LDLDIRECT 101.7 03/26/2012   TRIG 45.0 06/16/2019   CHOLHDL 2 06/16/2019    Memory difficulties: She is on Aricept by the neurologist, is taking 10 mg    Examination:   BP 130/60 (BP  Location: Left Arm, Patient Position: Sitting, Cuff Size: Normal)   Pulse 79   Ht 5' 4"  (1.626 m)   Wt 109 lb (49.4 kg)   SpO2 93%   BMI 18.71 kg/m   Body mass index is 18.71 kg/m.    ASSESSMENT/ PLAN:   Diabetes type 1 with poor control  See history of present illness for detailed discussion of current insulin, blood sugar patterns, problems identified  A1c has been under 7% and now 6.4  Her freestyle libre appears to be reading falsely low However recently her blood sugars appear to be trending higher overnight from getting excessive snacks at times at bedtime Some of this may be related to overeating because of low normal sugars and increased hunger after dinner when she is more active  Fasting readings are mostly high but  variable   Recommendations:  She will try to continue using the Accu-Chek to correlate with her freestyle libre readings  Need to make sure that her meter and libre reader are both programmed to the same time  Stop taking NovoLog at dinnertime  No change in [place] unless her morning sugars are starting to get low  She will try to eat more lower fat snacks at night  Discussed need to avoid hypoglycemia and also to not overtreat low normal blood sugars with extra snacks  She will need to take carbohydrate snacks when she is more active in the evenings especially blood sugars are near normal  Try not to take more than 1 unit of NovoLog unless blood sugar is over 200 or eating a very large carbohydrate meal  Consider follow-up with diabetes educator again  Review blood sugar patterns again in 2 months  LIPIDS: Needs follow-up in the next visit  There are no Patient Instructions on file for this visit.     [name] 02/19/2020, 1:18 PM

## 2020-02-20 ENCOUNTER — Ambulatory Visit: Payer: Medicare Other | Admitting: Pharmacist

## 2020-02-20 DIAGNOSIS — E7800 Pure hypercholesterolemia, unspecified: Secondary | ICD-10-CM

## 2020-02-20 DIAGNOSIS — E1065 Type 1 diabetes mellitus with hyperglycemia: Secondary | ICD-10-CM

## 2020-02-20 DIAGNOSIS — F332 Major depressive disorder, recurrent severe without psychotic features: Secondary | ICD-10-CM

## 2020-02-20 DIAGNOSIS — F17210 Nicotine dependence, cigarettes, uncomplicated: Secondary | ICD-10-CM

## 2020-02-20 NOTE — Patient Instructions (Signed)
Visit Information  Phone number for Pharmacist: [phone]  Goals Addressed            This Visit's Progress   . Pharmacy Care Plan       CARE PLAN ENTRY (see longitudinal plan of care for additional care plan information)  Current Barriers:  . Chronic Disease Management support, education, and care coordination needs related to Hyperlipidemia, Diabetes, Depression, Anxiety, and Tobacco use  Hyperlipidemia / Cardiovascular risk reduction Lab Results  Component Value Date/Time   LDLCALC 89 06/16/2019 02:38 PM   LDLDIRECT 101.7 03/26/2012 10:48 AM .  Pharmacist Clinical Goal(s): o Over the next 90 days, patient will work with PharmD and providers to maintain LDL goal < 100 . Current regimen:  o lovastatin 40 mg at bedtime o aspirin 81 mg daily,  o fish oil OTC . Interventions: o Discussed cholesterol goals and benefits of medication for prevention of heart attack / stroke . Patient self care activities - Over the next 90 days, patient will: o Continue medications as prescribed o Reduce cholesterol in diet  Diabetes (Type 1) Lab Results  Component Value Date/Time   HGBA1C 7.1 (H) 12/16/2019 11:23 AM   HGBA1C 6.6 (H) 09/16/2019 10:28 AM .  Pharmacist Clinical Goal(s): o Over the next 90 days, patient will work with PharmD and providers to achieve A1c goal <7% . Current regimen:  o Tresiba 8 units every morning o Humalog 1 units with meals o Freestyle Libre 2 . Interventions: o Discussed insulin stacking - taking Humalog (or Novolog) more than once within a 3-4 hour period may lead to low blood sugar . Patient self care activities - Over the next 90 days, patient will: o Check blood sugar via [employer], and provide at future appointments o Contact provider with any episodes of hypoglycemia o Take Humalog 1 unit with meals. Avoid taking a second dose of Humalog within 3-4 hours   Depression/Anxiety . Pharmacist Clinical Goal(s) o Over the next 90 days, patient  will work with PharmD and providers to optimize therapy . Current regimen:  o Lamotrigine 100 mg twice a day o Trazodone 100 mg at bedtime as needed . Interventions: o Patient could not tolerate bupropion; recommended to follow up with psychiatrist for next steps . Patient self care activities - Over the next 90 days, patient will: o Follow up with psychiatry as scheduled  Smoking cessation . Pharmacist Clinical Goal(s) o Over the next 90 days, patient will work with PharmD and providers to reduce cigarette use . Current regimen:  o No medications. Smoking 2 pack-per-day . Interventions: o Provided [place] Quit Line number for more resources and counseling . Patient self care activities - Over the next 90 days, patient will: o Contact  Quit Line (1-800-QUIT-NOW or [phone] [phone])  Medication management . Pharmacist Clinical Goal(s): o Over the next 90 days, patient will work with PharmD and providers to achieve optimal medication adherence . Current pharmacy: [facility] . Interventions o Comprehensive medication review performed. o Utilize [facility] pharmacy for medication synchronization, packaging and delivery . Patient self care activities - Over the next 90 days, patient will: o Focus on medication adherence by fill date o Take medications as prescribed o Report any questions or concerns to PharmD and/or provider(s)  Please see past updates related to this goal by clicking on the "Past Updates" button in the selected goal       Patient verbalizes understanding of instructions provided today.  Telephone follow up appointment with pharmacy team member scheduled  for: 3 months  Charlene Brooke, PharmD, [facility] Clinical Pharmacist [facility] [phone]

## 2020-02-20 NOTE — Chronic Care Management (AMB) (Signed)
Chronic Care Management Pharmacy  Name: Leslie Lane  MRN: [id number] DOB: 1955/07/08   Chief Complaint/ HPI  Leslie Lane,  64 y.o. , female presents for their Follow-Up CCM visit with the clinical pharmacist via telephone due to COVID-19 Pandemic.  PCP : [name], MD  Their chronic conditions include: Hyperlipidemia, Type 1 Diabetes, COPD and Tobacco use, Bipolar disorder, depression/anxiety, GERD, hx Hepatitis B  Lives with mother, she gives glucagon shot if needed. Brother and 2 sisters, sister visits about once a week.    Fatigue is biggest problem.    Office Visits:  12/16/19 Dr Sharlet Salina OV: f/u depression. Trintellix gave her headaches, stopped after 7 days. Refer back to psych.  06/17/19 Dr Sharlet Salina OV: refilled tramadol, but this will not be chronic rx. Gabapentin is maintenance tx for low back pain. Pt still smoking, not ready to quit.  Consult Visit: 02/19/20 Dr Dwyane Dee (endocrine): CGM giving false lows, rec'd to correlate with Accu-Chek. Stop Novolog at bedtime. No change in [place] unless AM sugars low. Avoid Novolog > 1 unit unless BG > 200.   01/22/20 Dr Doyne Keel (psych): stopped Trintellix d/t fatigue. Started Bupropion XL 150 mg for depression, smoking cessation.  12/19/19 Dr Dwyane Dee (endocrine): not a good candidate for insulin pump. Stop Humalog at dinnertime due to evening lows. Needs simple sugars when she is more active to avoid lows (not sugar-free cookies or peanut butter). Avoid high fat snacks late evening.  11/27/19 Dr Doyne Keel (psych): previous med trial ineffective. Trial Trintellix 5 mg.  09/18/19 Dr Dwyane Dee (endocrine): frequent low sugars, down to 40s, with hypoglycemia unawareness. Low sugar usually related to increased activity in afternoons, she still takes Novolog at lunch. Recs: move [place] to dinnertime, cut back on high-carb snacks at bedtime, eliminate mealtime coverage at lunch, may not need at dinnertime unless significant carb load.  Pt to call re: Freestyle Libre 2.   08/28/19 Dr Doyne Keel (psych): pt unwilling to try new meds d/t fear of SE. Finances limited, strict budget.   07/31/19 Dr Anselm Pancoast (neurology): memory loss, started after diabetic coma at 19. [place] 14/30. MRI 01/2019 negative acute changes. Started donepezil and pt reports still having memory loss. Several pillboxes at home but does not use, sometimes forgets meds. Ordered home sleep study.   06/18/19 Dr Dwyane Dee (endocrine): labile BG although A1c is controlled. Cannot tolerate Toujeo d/t headache, bloating, nausea. Significant issues with hypoglycemia. Thinking about Freestyle Libre 2 due to low alert capability. Would not be good candidate for insulin pump.  06/17/19 Dr Tamala Julian (sports medicine): trigger finger, receiving prn injections.  05/15/19 Dr Doyne Keel (psych): discussed other med options, pt declined all options d/t cost or fear of SE, though about low-dose Ritalin but pt declined, pt stated "I guess I will just stay feeling like I am feeling".   Medications: Outpatient Encounter Medications as of 02/20/2020  Medication Sig Note  . Accu-Chek Softclix Lancets lancets Use as instructed to check blood sugar 4 times per day dx code E10.65   . Alcohol Swabs (B-D SINGLE USE SWABS REGULAR) PADS USE FOUR TIMES DAILY   . aspirin EC 81 MG tablet Take 81 mg by mouth every morning.   . Blood Glucose Calibration (ACCU-CHEK AVIVA) SOLN USE AS DIRECTED   . Blood Glucose Monitoring Suppl (ACCU-CHEK AVIVA PLUS) w/Device KIT USE AS DIRECTED   . diclofenac Sodium (VOLTAREN) 1 % GEL APPLY 2 GRAMS EXTERNALLY TO THE AFFECTED AREA FOUR TIMES DAILY   . donepezil (ARICEPT) 10 MG  tablet TAKE 1/2 TABLET BY MOUTH DAILY FOR 2 WEEKS. INCREASE TO 1 TABLET DAILY AND. CONTINUE   . Glucagon (GVOKE HYPOPEN 2-PACK) 1 MG/0.2ML SOAJ Inject 1 mg into the skin as needed. For Hypo   . glucose blood (ACCU-CHEK AVIVA PLUS) test strip TEST BLOOD SUGAR FOUR TIMES DAILY   . insulin degludec (TRESIBA  FLEXTOUCH) 100 UNIT/ML FlexTouch Pen INJECT 15 UNITS INTO THE SKIN DAILY.   Marland Kitchen insulin lispro (HUMALOG KWIKPEN) 100 UNIT/ML KwikPen Inject 10 units under the skin at breakfast, 8 units at dinner, and 1-3 units as needed for high blood sugars.   . Insulin Pen Needle (B-D ULTRAFINE III SHORT PEN) 31G X 8 MM MISC Use as instructed to inject insulin 3 times daily.   Marland Kitchen lamoTRIgine (LAMICTAL) 100 MG tablet Take 1 tablet (100 mg total) by mouth 2 (two) times daily.   Marland Kitchen lidocaine (LIDODERM) 5 % UNWRAP AND APPLY 1 PATCH TO SKIN EVERY DAY(REMOVE AND DISCARD PATCH WITHIN 12 HOURS OR AS DIRECTED BY DOCTOR)   . lipase/protease/amylase (CREON) 36000 UNITS CPEP capsule Take 2 capsules (72,000 Units total) by mouth 3 (three) times daily before meals.   Marland Kitchen loratadine (CLARITIN) 10 MG tablet Take 10 mg by mouth as needed for allergies.   Marland Kitchen lovastatin (MEVACOR) 40 MG tablet Take 1 tablet (40 mg total) by mouth at bedtime.   . Magnesium 500 MG TABS Take 1 tablet by mouth as needed.   . metoCLOPramide (REGLAN) 5 MG tablet TAKE 1 TABLET THREE TIMES DAILY BEFORE MEALS AS NEEDED   . Multiple Vitamin (MULTIVITAMIN) tablet Take 1 tablet by mouth daily. Reported on 11/22/2015   . Omega-3 Fatty Acids (FISH OIL) 1000 MG CAPS Take 1 capsule by mouth daily.   Marland Kitchen omeprazole (PRILOSEC) 40 MG capsule Take one capsule by mouth daily   . traZODone (DESYREL) 100 MG tablet Take 4 tablets (400 mg total) by mouth at bedtime.   . vitamin B-12 (CYANOCOBALAMIN) 1000 MCG tablet Take 1 tablet by mouth daily.   . vitamin E (VITAMIN E) 1000 UNIT capsule Take 1,000 Units by mouth daily.     Marland Kitchen buPROPion (WELLBUTRIN XL) 150 MG 24 hr tablet Take 1 tablet (150 mg total) by mouth every morning.   . gabapentin (NEURONTIN) 100 MG capsule TAKE 1 TO 2 CAPSULES(100 TO 200 MG) BY MOUTH TWICE DAILY (Patient not taking: Reported on 02/20/2020) 01/21/2020: Taking as needed   No facility-administered encounter medications on file as of 02/20/2020.     Current  Diagnosis/Assessment:  Goals Addressed            This Visit's Progress   . Pharmacy Care Plan       CARE PLAN ENTRY (see longitudinal plan of care for additional care plan information)  Current Barriers:  . Chronic Disease Management support, education, and care coordination needs related to Hyperlipidemia, Diabetes, Depression, Anxiety, and Tobacco use  Hyperlipidemia / Cardiovascular risk reduction Lab Results  Component Value Date/Time   LDLCALC 89 06/16/2019 02:38 PM   LDLDIRECT 101.7 03/26/2012 10:48 AM .  Pharmacist Clinical Goal(s): o Over the next 90 days, patient will work with PharmD and providers to maintain LDL goal < 100 . Current regimen:  o lovastatin 40 mg at bedtime o aspirin 81 mg daily,  o fish oil OTC . Interventions: o Discussed cholesterol goals and benefits of medication for prevention of heart attack / stroke . Patient self care activities - Over the next 90 days, patient will: o Continue medications  as prescribed o Reduce cholesterol in diet  Diabetes (Type 1) Lab Results  Component Value Date/Time   HGBA1C 7.1 (H) 12/16/2019 11:23 AM   HGBA1C 6.6 (H) 09/16/2019 10:28 AM .  Pharmacist Clinical Goal(s): o Over the next 90 days, patient will work with PharmD and providers to achieve A1c goal <7% . Current regimen:  o Tresiba 8 units every morning o Humalog 1 units with meals o Freestyle Libre 2 . Interventions: o Discussed insulin stacking - taking Humalog (or Novolog) more than once within a 3-4 hour period may lead to low blood sugar . Patient self care activities - Over the next 90 days, patient will: o Check blood sugar via [employer], and provide at future appointments o Contact provider with any episodes of hypoglycemia o Take Humalog 1 unit with meals. Avoid taking a second dose of Humalog within 3-4 hours   Depression/Anxiety . Pharmacist Clinical Goal(s) o Over the next 90 days, patient will work with PharmD and providers to  optimize therapy . Current regimen:  o Lamotrigine 100 mg twice a day o Trazodone 100 mg at bedtime as needed . Interventions: o Patient could not tolerate bupropion; recommended to follow up with psychiatrist for next steps . Patient self care activities - Over the next 90 days, patient will: o Follow up with psychiatry as scheduled  Smoking cessation . Pharmacist Clinical Goal(s) o Over the next 90 days, patient will work with PharmD and providers to reduce cigarette use . Current regimen:  o No medications. Smoking 2 pack-per-day . Interventions: o Provided [place] [facility]uit Line number for more resources and counseling . Patient self care activities - Over the next 90 days, patient will: o Contact [place] [facility]uit [facility] (1-800-QUIT-NOW or ([phone])  Medication management . Pharmacist Clinical Goal(s): o Over the next 90 days, patient will work with PharmD and providers to achieve optimal medication adherence . Current pharmacy: [facility] . Interventions o Comprehensive medication review performed. o Utilize [facility] pharmacy for medication synchronization, packaging and delivery . Patient self care activities - Over the next 90 days, patient will: o Focus on medication adherence by fill date o Take medications as prescribed o Report any questions or concerns to PharmD and/or provider(s)  Please see past updates related to this goal by clicking on the "Past Updates" button in the selected goal        Diabetes (Type 1)   A1c goal < 7% without hypoglycemia  Recent Relevant Labs: Lab Results  Component Value Date/Time   HGBA1C 6.4 02/17/2020 02:10 PM   HGBA1C 7.1 (H) 12/16/2019 11:23 AM   MICROALBUR 1.9 09/16/2019 10:28 AM   MICROALBUR 273.4 (H) 01/20/2019 11:27 AM    Last diabetic eye exam:  Lab Results  Component Value Date/Time   HMDIABEYEEXA No Retinopathy 08/11/2019 12:00 AM    Last diabetic foot exam: No results found for: HMDIABFOOTEX   Checking BG: via [place]  2.   Recent BG: 92  Patient has failed these meds in past: Lantus, Toujeo,  Patient is currently controlled on the following medications:   Tresiba 8 units daily - AM  Humalog 1-3 units TID w/ meals  Gvoke Hypopen PRN  Freestyle Libre  We discussed: Pt thinks her blood sugar is the reason why she is tired all the time. She says she feels tired/fatigued when her BG is 92 or 150. Discussed other causes of fatigue including lack of sleep, lack of exercise, poor diet, depression. Discussed BG is unlikely the cause of  her fatigue since she feels bad no matter what her BG is. She does feel better on days she gets out of the house and exercises; encouraged daily exercise  Plan  Continue current medications and control with diet and exercise     Hyperlipidemia   LDL goal < 100  Lipid Panel     Component Value Date/Time   CHOL 178 06/16/2019 1438   TRIG 45.0 06/16/2019 1438   HDL 80.80 06/16/2019 1438   CHOLHDL 2 06/16/2019 1438   VLDL 9.0 06/16/2019 1438   LDLCALC 89 06/16/2019 1438   LDLDIRECT 101.7 03/26/2012 1048    The 10-year ASCVD risk score Mikey Bussing DC Jr., et al., 2013) is: 15.6%   Values used to calculate the score:     Age: 85 years     Sex: Female     Is Non-Hispanic African American: No     Diabetic: Yes     Tobacco smoker: Yes     Systolic Blood Pressure: 939 mmHg     Is BP treated: No     HDL Cholesterol: 80.8 mg/dL     Total Cholesterol: 178 mg/dL   Patient has failed these meds in past: simvastatin Patient is currently controlled on the following medications:   lovastatin 40 mg HS,   aspirin 81 mg daily,   fish oil OTC  We discussed:  diet and exercise extensively, adherence to statin.  Plan  Continue current medications and control with diet and exercise  Depression/Anxiety    Depression screen [facility] 2/9 06/17/2019 03/20/2019 11/27/2017  Decreased Interest 1 0 1  Down, Depressed, Hopeless 1 0 2  PHQ - 2 Score 2 0 3  Altered sleeping 2 - 0    Tired, decreased energy 3 - 2  Change in appetite 0 - 0  Feeling bad or failure about yourself  3 - 2  Trouble concentrating 3 - 3  Moving slowly or fidgety/restless 3 - 3  Suicidal thoughts 1 - 0  PHQ-9 Score 17 - 13  Difficult doing work/chores - - -  Some recent data might be hidden   Patient has failed these meds in past: buspirone, escitalopram, hydroxyzine, paroxetine, venlafaxine, Trintellix, bupropion Patient is currently uncontrolled on the following medications:   lamotrigine 100 mg BID,   trazodone 100 mg HS  We discussed: pt reports she took bupropion for about a week before stopping it due to increased fatigue. Discussed bupropion is typically more activating and does not normally cause fatigue. Pt also reports she was more irritable, which is a common side effect. Pt has f/u with her psychiatrist today and plans to discuss next options.  Plan   Continue current medications  Follow up with psychiatrist as scheduled  Tobacco Abuse   Tobacco Status:  Social History   Tobacco Use  Smoking Status Current Every Day Smoker  . Packs/day: 2.00  . Years: 40.00  . Pack years: 80.00  . Types: Cigarettes  Smokeless Tobacco Never Used   Patient smokes Within 30 minutes of waking  reports MOTIVATION to quit is high  reports CONFIDENCE in quitting is low "I have no will power"  Previous quit attempts: tried Nicotine patch, Chantix (weird dreams) Did quit for 2 weeks before cataract surgery once, felt very irritable and on-edge the whole time.  Patient has failed these meds in past: Chantix (dreams), Nicotine patch, bupropion Patient is currently uncontrolled on the following medications:  . No medications  We discussed: pt did not tolerate bupropion (see above);  Provided contact information for [place] [facility]uit [facility] (1-800-QUIT-NOW) and encouraged patient to reach out to this group for support.  Plan  Contact [place] [facility]uit Line for counseling  Medication Management   Pt uses  [facility] for all medications Uses pill box? No - organizes bottles Pt endorses compliance  We discussed: Reviewed patient's [facility] medication and Epic medication profile assuring there are no discrepancies or gaps in therapy. Confirmed all fill dates appropriate and verified with patient that there is a sufficient quantity of all prescribed medications at home. Informed patient to call me any time if needing medications before scheduled deliveries. The anticipated medication sync date is 04/02/20  Pt requested refills to be delivered 02/25/20 for: GVOKE Hypopen Lovastatin 40 mg Lamotrogine 100 mg  Trazodone 100 mg  Plan  Utilize [facility] pharmacy for medication synchronization, packaging and delivery     Follow up: 3 month phone visit  Charlene Brooke, PharmD, BCACP Clinical Pharmacist [facility] [phone]

## 2020-02-21 ENCOUNTER — Other Ambulatory Visit (HOSPITAL_COMMUNITY): Payer: Self-pay | Admitting: Psychiatry

## 2020-02-21 ENCOUNTER — Other Ambulatory Visit: Payer: Self-pay | Admitting: Endocrinology

## 2020-02-21 ENCOUNTER — Other Ambulatory Visit: Payer: Self-pay | Admitting: Internal Medicine

## 2020-02-21 DIAGNOSIS — G4701 Insomnia due to medical condition: Secondary | ICD-10-CM

## 2020-02-21 DIAGNOSIS — F4010 Social phobia, unspecified: Secondary | ICD-10-CM

## 2020-02-21 DIAGNOSIS — F332 Major depressive disorder, recurrent severe without psychotic features: Secondary | ICD-10-CM

## 2020-02-26 DIAGNOSIS — Z794 Long term (current) use of insulin: Secondary | ICD-10-CM | POA: Diagnosis not present

## 2020-02-26 DIAGNOSIS — E1065 Type 1 diabetes mellitus with hyperglycemia: Secondary | ICD-10-CM | POA: Diagnosis not present

## 2020-03-01 ENCOUNTER — Ambulatory Visit: Payer: Medicare Other | Admitting: Neurology

## 2020-03-23 ENCOUNTER — Telehealth: Payer: Self-pay | Admitting: Pharmacist

## 2020-03-23 NOTE — Progress Notes (Signed)
Chronic Care Management Pharmacy Assistant   Name: CARIE KAPUSCINSKI  MRN: [id number] DOB: Jan 30, 1956  Reason for Encounter: Medication Review  PCP : [name], MD  Allergies:   Allergies  Allergen Reactions  . Penicillins Anaphylaxis  . Sulfonamide Derivatives Anaphylaxis    Medications: Outpatient Encounter Medications as of 03/23/2020  Medication Sig Note  . Accu-Chek Softclix Lancets lancets Use as instructed to check blood sugar 4 times per day dx code E10.65   . Alcohol Swabs (B-D SINGLE USE SWABS REGULAR) PADS USE FOUR TIMES DAILY   . aspirin EC 81 MG tablet Take 81 mg by mouth every morning.   . Blood Glucose Calibration (ACCU-CHEK AVIVA) SOLN USE AS DIRECTED   . Blood Glucose Monitoring Suppl (ACCU-CHEK AVIVA PLUS) w/Device KIT USE AS DIRECTED   . buPROPion (WELLBUTRIN XL) 150 MG 24 hr tablet Take 1 tablet (150 mg total) by mouth every morning.   . diclofenac Sodium (VOLTAREN) 1 % GEL APPLY 2 GRAMS EXTERNALLY TO THE AFFECTED AREA FOUR TIMES DAILY   . donepezil (ARICEPT) 10 MG tablet TAKE 1/2 TABLET BY MOUTH DAILY FOR 2 WEEKS. INCREASE TO 1 TABLET DAILY AND. CONTINUE   . gabapentin (NEURONTIN) 100 MG capsule TAKE 1 TO 2 CAPSULES(100 TO 200 MG) BY MOUTH TWICE DAILY (Patient not taking: Reported on 02/20/2020) 01/21/2020: Taking as needed  . Glucagon (GVOKE HYPOPEN 2-PACK) 1 MG/0.2ML SOAJ Inject 1 mg into the skin as needed. For Hypo   . glucose blood (ACCU-CHEK AVIVA PLUS) test strip TEST BLOOD SUGAR FOUR TIMES DAILY   . insulin degludec (TRESIBA FLEXTOUCH) 100 UNIT/ML FlexTouch Pen INJECT 15 UNITS INTO THE SKIN DAILY.   Marland Kitchen insulin lispro (HUMALOG KWIKPEN) 100 UNIT/ML KwikPen Inject 10 units under the skin at breakfast, 8 units at dinner, and 1-3 units as needed for high blood sugars.   . Insulin Pen Needle (B-D ULTRAFINE III SHORT PEN) 31G X 8 MM MISC Use as instructed to inject insulin 3 times daily.   Marland Kitchen lamoTRIgine (LAMICTAL) 100 MG tablet Take 1 tablet (100 mg  total) by mouth 2 (two) times daily.   Marland Kitchen lidocaine (LIDODERM) 5 % UNWRAP AND APPLY 1 PATCH TO SKIN EVERY DAY(REMOVE AND DISCARD PATCH WITHIN 12 HOURS OR AS DIRECTED BY DOCTOR)   . lipase/protease/amylase (CREON) 36000 UNITS CPEP capsule Take 2 capsules (72,000 Units total) by mouth 3 (three) times daily before meals.   Marland Kitchen loratadine (CLARITIN) 10 MG tablet Take 10 mg by mouth as needed for allergies.   Marland Kitchen lovastatin (MEVACOR) 40 MG tablet TAKE 1 TABLET(40 MG) BY MOUTH AT BEDTIME   . Magnesium 500 MG TABS Take 1 tablet by mouth as needed.   . metoCLOPramide (REGLAN) 5 MG tablet TAKE 1 TABLET BY MOUTH THREE TIMES DAILY BEFORE MEALS AS NEEDED   . Multiple Vitamin (MULTIVITAMIN) tablet Take 1 tablet by mouth daily. Reported on 11/22/2015   . Omega-3 Fatty Acids (FISH OIL) 1000 MG CAPS Take 1 capsule by mouth daily.   Marland Kitchen omeprazole (PRILOSEC) 40 MG capsule Take one capsule by mouth daily   . traZODone (DESYREL) 100 MG tablet Take 4 tablets (400 mg total) by mouth at bedtime.   . vitamin B-12 (CYANOCOBALAMIN) 1000 MCG tablet Take 1 tablet by mouth daily.   . vitamin E (VITAMIN E) 1000 UNIT capsule Take 1,000 Units by mouth daily.      No facility-administered encounter medications on file as of 03/23/2020.    Current Diagnosis: Patient Active Problem List  Diagnosis Date Noted  . Other sprain of right middle finger, initial encounter 06/17/2019  . Acquired trigger finger of left ring finger 02/18/2019  . Right shoulder pain 02/18/2018  . Protein calorie malnutrition ([place]) 06/01/2017  . Routine general medical examination at a health care facility 06/01/2017  . WBC decreased 03/27/2017  . Diabetic gastroparesis associated with type 1 diabetes mellitus ([place]) 10/24/2016  . Abdominal pain 02/28/2016  . Insomnia 04/13/2015  . Major depressive disorder, recurrent, severe without psychotic features ([place]) 07/28/2014  . Generalized anxiety disorder 04/27/2014  . Abnormal CT scan, bladder   . HEPATITIS B  07/05/2010  . Mixed diabetic hyperlipidemia associated with type 1 diabetes mellitus ([place]) 07/05/2010  . BIPOLAR AFFECTIVE DISORDER 07/05/2010  . Smokers' cough ([place]) 07/05/2010  . MIGRAINE HEADACHE 07/05/2010  . GERD 07/05/2010  . LOW BACK PAIN, CHRONIC 07/05/2010    Goals Addressed   None     Follow-Up:  Pharmacist Review     Received call from patient regarding medication management via [facility].  Patient requested an acute fill for Omeprazole 23m, Metoclopram 580m to be delivered: 03/24/2020 Pharmacy needs refills? No  Confirmed delivery date of 03/24/2020, advised patient that pharmacy will contact them the morning of delivery.  DeRosendo GrosCMIndustryharmacist Assistant  [phone]

## 2020-03-25 ENCOUNTER — Other Ambulatory Visit: Payer: Self-pay

## 2020-03-25 ENCOUNTER — Ambulatory Visit: Payer: Medicare Other | Admitting: Pharmacist

## 2020-03-25 DIAGNOSIS — F17210 Nicotine dependence, cigarettes, uncomplicated: Secondary | ICD-10-CM

## 2020-03-25 DIAGNOSIS — E1065 Type 1 diabetes mellitus with hyperglycemia: Secondary | ICD-10-CM

## 2020-03-25 DIAGNOSIS — E7800 Pure hypercholesterolemia, unspecified: Secondary | ICD-10-CM

## 2020-03-25 NOTE — Chronic Care Management (AMB) (Signed)
Chronic Care Management Pharmacy  Name: Leslie Lane  MRN: [id number] DOB: [birth year]   Chief Complaint/ HPI  Margorie Lane,  64 y.o. , female presents for their Follow-Up CCM visit with the clinical pharmacist via telephone due to COVID-19 Pandemic.  PCP : [name], MD  Their chronic conditions include: Hyperlipidemia, Type 1 Diabetes, COPD and Tobacco use, Bipolar disorder, depression/anxiety, GERD, hx Hepatitis B  Lives with mother, she gives glucagon shot if needed. Brother and 2 sisters, sister visits about once a week.    Office Visits:  12/16/19 Dr Sharlet Salina OV: f/u depression. Trintellix gave her headaches, stopped after 7 days. Refer back to psych.  06/17/19 Dr Sharlet Salina OV: refilled tramadol, but this will not be chronic rx. Gabapentin is maintenance tx for low back pain. Pt still smoking, not ready to quit.  Consult Visit: 02/19/20 Dr Dwyane Dee (endocrine): CGM giving false lows, rec'd to correlate with Accu-Chek. Stop Novolog at bedtime. No change in [place] unless AM sugars low. Avoid Novolog > 1 unit unless BG > 200.   01/22/20 Dr Doyne Keel (psych): stopped Trintellix d/t fatigue. Started Bupropion XL 150 mg for depression, smoking cessation.  12/19/19 Dr Dwyane Dee (endocrine): not a good candidate for insulin pump. Stop Humalog at dinnertime due to evening lows. Needs simple sugars when she is more active to avoid lows (not sugar-free cookies or peanut butter). Avoid high fat snacks late evening.  11/27/19 Dr Doyne Keel (psych): previous med trial ineffective. Trial Trintellix 5 mg.  09/18/19 Dr Dwyane Dee (endocrine): frequent low sugars, down to 40s, with hypoglycemia unawareness. Low sugar usually related to increased activity in afternoons, she still takes Novolog at lunch. Recs: move [place] to dinnertime, cut back on high-carb snacks at bedtime, eliminate mealtime coverage at lunch, may not need at dinnertime unless significant carb load. Pt to call re: Freestyle Libre 2.    08/28/19 Dr Doyne Keel (psych): pt unwilling to try new meds d/t fear of SE. Finances limited, strict budget.   07/31/19 Dr Anselm Pancoast (neurology): memory loss, started after diabetic coma at 56. [place] 14/30. MRI 01/2019 negative acute changes. Started donepezil and pt reports still having memory loss. Several pillboxes at home but does not use, sometimes forgets meds. Ordered home sleep study.   06/18/19 Dr Dwyane Dee (endocrine): labile BG although A1c is controlled. Cannot tolerate Toujeo d/t headache, bloating, nausea. Significant issues with hypoglycemia. Thinking about Freestyle Libre 2 due to low alert capability. Would not be good candidate for insulin pump.  06/17/19 Dr Tamala Julian (sports medicine): trigger finger, receiving prn injections.  05/15/19 Dr Doyne Keel (psych): discussed other med options, pt declined all options d/t cost or fear of SE, though about low-dose Ritalin but pt declined, pt stated "I guess I will just stay feeling like I am feeling".   Medications: Outpatient Encounter Medications as of 03/25/2020  Medication Sig Note  . Accu-Chek Softclix Lancets lancets Use as instructed to check blood sugar 4 times per day dx code E10.65   . Alcohol Swabs (B-D SINGLE USE SWABS REGULAR) PADS USE FOUR TIMES DAILY   . aspirin EC 81 MG tablet Take 81 mg by mouth every morning.   . Blood Glucose Calibration (ACCU-CHEK AVIVA) SOLN USE AS DIRECTED   . Blood Glucose Monitoring Suppl (ACCU-CHEK AVIVA PLUS) w/Device KIT USE AS DIRECTED   . buPROPion (WELLBUTRIN XL) 150 MG 24 hr tablet Take 1 tablet (150 mg total) by mouth every morning.   . diclofenac Sodium (VOLTAREN) 1 % GEL APPLY 2 GRAMS EXTERNALLY  TO THE AFFECTED AREA FOUR TIMES DAILY   . donepezil (ARICEPT) 10 MG tablet TAKE 1/2 TABLET BY MOUTH DAILY FOR 2 WEEKS. INCREASE TO 1 TABLET DAILY AND. CONTINUE   . gabapentin (NEURONTIN) 100 MG capsule TAKE 1 TO 2 CAPSULES(100 TO 200 MG) BY MOUTH TWICE DAILY 01/21/2020: Taking as needed  . Glucagon (GVOKE  HYPOPEN 2-PACK) 1 MG/0.2ML SOAJ Inject 1 mg into the skin as needed. For Hypo   . glucose blood (ACCU-CHEK AVIVA PLUS) test strip TEST BLOOD SUGAR FOUR TIMES DAILY   . insulin degludec (TRESIBA FLEXTOUCH) 100 UNIT/ML FlexTouch Pen INJECT 15 UNITS INTO THE SKIN DAILY.   Marland Kitchen insulin lispro (HUMALOG KWIKPEN) 100 UNIT/ML KwikPen Inject 10 units under the skin at breakfast, 8 units at dinner, and 1-3 units as needed for high blood sugars.   . Insulin Pen Needle (B-D ULTRAFINE III SHORT PEN) 31G X 8 MM MISC Use as instructed to inject insulin 3 times daily.   Marland Kitchen lamoTRIgine (LAMICTAL) 100 MG tablet Take 1 tablet (100 mg total) by mouth 2 (two) times daily.   Marland Kitchen lidocaine (LIDODERM) 5 % UNWRAP AND APPLY 1 PATCH TO SKIN EVERY DAY(REMOVE AND DISCARD PATCH WITHIN 12 HOURS OR AS DIRECTED BY DOCTOR)   . lipase/protease/amylase (CREON) 36000 UNITS CPEP capsule Take 2 capsules (72,000 Units total) by mouth 3 (three) times daily before meals.   Marland Kitchen loratadine (CLARITIN) 10 MG tablet Take 10 mg by mouth as needed for allergies.   . Magnesium 500 MG TABS Take 1 tablet by mouth as needed.   . metoCLOPramide (REGLAN) 5 MG tablet TAKE 1 TABLET BY MOUTH THREE TIMES DAILY BEFORE MEALS AS NEEDED   . Multiple Vitamin (MULTIVITAMIN) tablet Take 1 tablet by mouth daily. Reported on 11/22/2015   . Omega-3 Fatty Acids (FISH OIL) 1000 MG CAPS Take 1 capsule by mouth daily.   Marland Kitchen omeprazole (PRILOSEC) 40 MG capsule Take one capsule by mouth daily   . traZODone (DESYREL) 100 MG tablet Take 4 tablets (400 mg total) by mouth at bedtime.   . vitamin B-12 (CYANOCOBALAMIN) 1000 MCG tablet Take 1 tablet by mouth daily.   . vitamin E (VITAMIN E) 1000 UNIT capsule Take 1,000 Units by mouth daily.     Marland Kitchen lovastatin (MEVACOR) 40 MG tablet TAKE 1 TABLET(40 MG) BY MOUTH AT BEDTIME (Patient not taking: Reported on 03/25/2020) 03/25/2020: Pt refuses to take statins   No facility-administered encounter medications on file as of 03/25/2020.     Current  Diagnosis/Assessment:  Goals Addressed            This Visit's Progress   . Pharmacy Care Plan       CARE PLAN ENTRY (see longitudinal plan of care for additional care plan information)  Current Barriers:  . Chronic Disease Management support, education, and care coordination needs related to Hyperlipidemia, Diabetes, Depression, Anxiety, and Tobacco use  Hyperlipidemia / Cardiovascular risk reduction Lab Results  Component Value Date/Time   LDLCALC 89 06/16/2019 02:38 PM   LDLDIRECT 101.7 03/26/2012 10:48 AM .  Pharmacist Clinical Goal(s): o Over the next 90 days, patient will work with PharmD and providers to maintain LDL goal < 100 . Current regimen:  o lovastatin 40 mg at bedtime* no longer taking o aspirin 81 mg daily,  o fish oil OTC . Interventions: o Discussed cholesterol goals and benefits of medication for prevention of heart attack / stroke o Discussed alternatives to statins are not as effective at preventing heart attacks, strokes, or  death from cardiovascular causes o Discussed ezetimibe (Zetia) is a non-statin drug that lowers cholesterol (not as well as statins) . Patient self care activities - Over the next 90 days, patient will: o Continue medications as prescribed o Reduce cholesterol in diet  Diabetes (Type 1) Lab Results  Component Value Date/Time   HGBA1C 7.1 (H) 12/16/2019 11:23 AM   HGBA1C 6.6 (H) 09/16/2019 10:28 AM .  Pharmacist Clinical Goal(s): o Over the next 90 days, patient will work with PharmD and providers to achieve A1c goal <7% . Current regimen:  o Tresiba 8 units every morning o Humalog 1 units with meals o Freestyle Libre 2 . Interventions: o Discussed insulin stacking - taking Humalog (or Novolog) more than once within a 3-4 hour period may lead to low blood sugar . Patient self care activities - Over the next 90 days, patient will: o Check blood sugar via [employer], and provide at future appointments o Contact provider  with any episodes of hypoglycemia o Take Humalog 1 unit with meals. Avoid taking a second dose of Humalog within 3-4 hours   Depression/Anxiety . Pharmacist Clinical Goal(s) o Over the next 90 days, patient will work with PharmD and providers to optimize therapy . Current regimen:  o Lamotrigine 100 mg twice a day o Trazodone 100 mg at bedtime as needed . Interventions: o Patient could not tolerate bupropion; recommended to follow up with psychiatrist for next steps . Patient self care activities - Over the next 90 days, patient will: o Follow up with psychiatry as scheduled  Smoking cessation . Pharmacist Clinical Goal(s) o Over the next 90 days, patient will work with PharmD and providers to reduce cigarette use . Current regimen:  o No medications. Smoking 2 pack-per-day . Interventions: o Provided [place] Quit Line number for more resources and counseling . Patient self care activities - Over the next 90 days, patient will: o Contact [place] [facility]uit Line (1-800-QUIT-NOW or (302)[phone])  Medication management . Pharmacist Clinical Goal(s): o Over the next 90 days, patient will work with PharmD and providers to achieve optimal medication adherence . Current pharmacy: [facility] . Interventions o Comprehensive medication review performed. o Utilize [facility] pharmacy for medication synchronization, packaging and delivery . Patient self care activities - Over the next 90 days, patient will: o Focus on medication adherence by fill date o Take medications as prescribed o Report any questions or concerns to PharmD and/or provider(s)  Please see past updates related to this goal by clicking on the "Past Updates" button in the selected goal        Diabetes (Type 1)   A1c goal < 7% without hypoglycemia  Recent Relevant Labs: Lab Results  Component Value Date/Time   HGBA1C 6.4 02/17/2020 02:10 PM   HGBA1C 7.1 (H) 12/16/2019 11:23 AM   MICROALBUR 1.9 09/16/2019 10:28 AM   MICROALBUR  273.4 (H) 01/20/2019 11:27 AM    Last diabetic eye exam:  Lab Results  Component Value Date/Time   HMDIABEYEEXA No Retinopathy 08/11/2019 12:00 AM    Last diabetic foot exam: No results found for: HMDIABFOOTEX   Checking BG: via [place] 2.  BG range: 55-280  Patient has failed these meds in past: Lantus, Toujeo,  Patient is currently controlled on the following medications:   Tresiba 8 units daily - AM  Humalog 1-3 units TID w/ meals  Gvoke Hypopen PRN  Freestyle Libre  We discussed: Pt is still struggling with low blood sugars, it was 52 last week. She  has not had to use GVOKE since our last discussion. Pt carries glucose tablets around with her and her mother is able to recognize signs of severe hypoglycemia and treat her.   Plan  Continue current medications and control with diet and exercise     Hyperlipidemia   LDL goal < 100  Lipid Panel     Component Value Date/Time   CHOL 178 06/16/2019 1438   TRIG 45.0 06/16/2019 1438   HDL 80.80 06/16/2019 1438   CHOLHDL 2 06/16/2019 1438   VLDL 9.0 06/16/2019 1438   LDLCALC 89 06/16/2019 1438   LDLDIRECT 101.7 03/26/2012 1048   The 10-year ASCVD risk score Mikey Bussing DC Jr., et al., 2013) is: 15.6%   Values used to calculate the score:     Age: 42 years     Sex: Female     Is Non-Hispanic African American: No     Diabetic: Yes     Tobacco smoker: Yes     Systolic Blood Pressure: 378 mmHg     Is BP treated: No     HDL Cholesterol: 80.8 mg/dL     Total Cholesterol: 178 mg/dL   Patient has failed these meds in past: simvastatin Patient is currently controlled on the following medications:   lovastatin 40 mg HS (no longer taking)  aspirin 81 mg daily,   fish oil OTC  We discussed:  Pt now refuses to take a statin; she has heard statins are "bad for you" and claims her mother was taken off of her statin due to it causing swelling in her legs. Pt reports she has not personally had any issues or side effects  with lovastatin. Discussed benefits of statin including prevention of ASCVD and CV death, which are particularly high risk w/ diabetes; pt still refused. She is willing to take a non-statin drug and agreed to take ezetimibe if it is prescribed.  Plan  Recommend ezetimibe 10 mg to replace lovastatin   Tobacco Abuse   Tobacco Status:  Social History   Tobacco Use  Smoking Status Current Every Day Smoker  . Packs/day: 2.00  . Years: 40.00  . Pack years: 80.00  . Types: Cigarettes  Smokeless Tobacco Never Used   Patient smokes Within 30 minutes of waking  reports MOTIVATION to quit is high  reports CONFIDENCE in quitting is low "I have no will power"  Previous quit attempts: tried Nicotine patch, Chantix (weird dreams) Did quit for 2 weeks before cataract surgery once, felt very irritable and on-edge the whole time.  Patient has failed these meds in past: Chantix (dreams), Nicotine patch, bupropion Patient is currently uncontrolled on the following medications:  . No medications  We discussed: pt did not tolerate bupropion (see above); Provided contact information for [place] [facility]uit Line (1-800-QUIT-NOW) and encouraged patient to reach out to this group for support. Pt has not yet called the Quit Line, reiterated benefits of smoking cessation and pt agreed to call.  Plan  Contact [place] Quit Line for counseling   Medication Management   Pt uses [facility] for all medications Uses pill box? No - organizes bottles Pt endorses compliance  We discussed: Reviewed patient's [facility] medication and Epic medication profile assuring there are no discrepancies or gaps in therapy. Confirmed all fill dates appropriate and verified with patient that there is a sufficient quantity of all prescribed medications at home. Informed patient to call me any time if needing medications before scheduled deliveries. The anticipated medication sync date is 04/02/20  Plan  Utilize [facility] pharmacy for  medication synchronization, packaging and delivery     Follow up: 3 month phone visit  Charlene Brooke, PharmD, [facility] Clinical Pharmacist [facility] [phone]

## 2020-03-26 NOTE — Patient Instructions (Addendum)
Visit Information  Phone number for Pharmacist: [phone]  Goals Addressed            This Visit's Progress   . Pharmacy Care Plan       CARE PLAN ENTRY (see longitudinal plan of care for additional care plan information)  Current Barriers:  . Chronic Disease Management support, education, and care coordination needs related to Hyperlipidemia, Diabetes, Depression, Anxiety, and Tobacco use  Hyperlipidemia / Cardiovascular risk reduction Lab Results  Component Value Date/Time   LDLCALC 89 06/16/2019 02:38 PM   LDLDIRECT 101.7 03/26/2012 10:48 AM .  Pharmacist Clinical Goal(s): o Over the next 90 days, patient will work with PharmD and providers to maintain LDL goal < 100 . Current regimen:  o lovastatin 40 mg at bedtime* no longer taking o aspirin 81 mg daily,  o fish oil OTC . Interventions: o Discussed cholesterol goals and benefits of medication for prevention of heart attack / stroke o Discussed alternatives to statins are not as effective at preventing heart attacks, strokes, or death from cardiovascular causes o Discussed ezetimibe (Zetia) is a non-statin drug that lowers cholesterol (not as well as statins) . Patient self care activities - Over the next 90 days, patient will: o Continue medications as prescribed o Reduce cholesterol in diet  Diabetes (Type 1) Lab Results  Component Value Date/Time   HGBA1C 7.1 (H) 12/16/2019 11:23 AM   HGBA1C 6.6 (H) 09/16/2019 10:28 AM .  Pharmacist Clinical Goal(s): o Over the next 90 days, patient will work with PharmD and providers to achieve A1c goal <7% . Current regimen:  o Tresiba 8 units every morning o Humalog 1 units with meals o Freestyle Libre 2 . Interventions: o Discussed insulin stacking - taking Humalog (or Novolog) more than once within a 3-4 hour period may lead to low blood sugar . Patient self care activities - Over the next 90 days, patient will: o Check blood sugar via [employer], and provide at  future appointments o Contact provider with any episodes of hypoglycemia o Take Humalog 1 unit with meals. Avoid taking a second dose of Humalog within 3-4 hours   Depression/Anxiety . Pharmacist Clinical Goal(s) o Over the next 90 days, patient will work with PharmD and providers to optimize therapy . Current regimen:  o Lamotrigine 100 mg twice a day o Trazodone 100 mg at bedtime as needed . Interventions: o Patient could not tolerate bupropion; recommended to follow up with psychiatrist for next steps . Patient self care activities - Over the next 90 days, patient will: o Follow up with psychiatry as scheduled  Smoking cessation . Pharmacist Clinical Goal(s) o Over the next 90 days, patient will work with PharmD and providers to reduce cigarette use . Current regimen:  o No medications. Smoking 2 pack-per-day . Interventions: o Provided [place] Quit Line number for more resources and counseling . Patient self care activities - Over the next 90 days, patient will: o Contact [place] Quit Line (1-800-QUIT-NOW or [phone] [phone])  Medication management . Pharmacist Clinical Goal(s): o Over the next 90 days, patient will work with PharmD and providers to achieve optimal medication adherence . Current pharmacy: [facility] . Interventions o Comprehensive medication review performed. o Utilize [facility] pharmacy for medication synchronization, packaging and delivery . Patient self care activities - Over the next 90 days, patient will: o Focus on medication adherence by fill date o Take medications as prescribed o Report any questions or concerns to PharmD and/or provider(s)  Please see past updates  related to this goal by clicking on the "Past Updates" button in the selected goal       Patient verbalizes understanding of instructions provided today.  Telephone follow up appointment with pharmacy team member scheduled for: 3 months  Charlene Brooke, PharmD, BCACP Clinical  Pharmacist [facility] [phone]  Cholesterol Content in Foods Cholesterol is a waxy, fat-like substance that helps to carry fat in the blood. The body needs cholesterol in small amounts, but too much cholesterol can cause damage to the arteries and heart. Most people should eat less than 200 milligrams (mg) of cholesterol a day. Foods with cholesterol  Cholesterol is found in animal-based foods, such as meat, seafood, and dairy. Generally, low-fat dairy and lean meats have less cholesterol than full-fat dairy and fatty meats. The milligrams of cholesterol per serving (mg per serving) of common cholesterol-containing foods are listed below. Meat and other proteins  Egg -- one large whole egg has 186 mg.  Veal shank -- 4 oz has 141 mg.  Lean ground [place] (93% lean) -- 4 oz has 118 mg.  Fat-trimmed lamb loin -- 4 oz has 106 mg.  Lean ground beef (90% lean) -- 4 oz has 100 mg.  Lobster -- 3.5 oz has 90 mg.  Pork loin chops -- 4 oz has 86 mg.  Canned salmon -- 3.5 oz has 83 mg.  Fat-trimmed beef top loin -- 4 oz has 78 mg.  Frankfurter -- 1 frank (3.5 oz) has 77 mg.  Crab -- 3.5 oz has 71 mg.  Roasted chicken without skin, white meat -- 4 oz has 66 mg.  Light bologna -- 2 oz has 45 mg.  Deli-cut [place] -- 2 oz has 31 mg.  Canned tuna -- 3.5 oz has 31 mg.  Berniece Salines -- 1 oz has 29 mg.  Oysters and mussels (raw) -- 3.5 oz has 25 mg.  Mackerel -- 1 oz has 22 mg.  Trout -- 1 oz has 20 mg.  Pork sausage -- 1 link (1 oz) has 17 mg.  Salmon -- 1 oz has 16 mg.  Tilapia -- 1 oz has 14 mg. Dairy  Soft-serve ice cream --  cup (4 oz) has 103 mg.  Whole-milk yogurt -- 1 cup (8 oz) has 29 mg.  Cheddar cheese -- 1 oz has 28 mg.  American cheese -- 1 oz has 28 mg.  Whole milk -- 1 cup (8 oz) has 23 mg.  2% milk -- 1 cup (8 oz) has 18 mg.  Cream cheese -- 1 tablespoon (Tbsp) has 15 mg.  Cottage cheese --  cup (4 oz) has 14 mg.  Low-fat  (1%) milk -- 1 cup (8 oz) has 10 mg.  Sour cream -- 1 Tbsp has 8.5 mg.  Low-fat yogurt -- 1 cup (8 oz) has 8 mg.  Nonfat Greek yogurt -- 1 cup (8 oz) has 7 mg.  Half-and-half cream -- 1 Tbsp has 5 mg. Fats and oils  Cod liver oil -- 1 tablespoon (Tbsp) has 82 mg.  Butter -- 1 Tbsp has 15 mg.  Lard -- 1 Tbsp has 14 mg.  Bacon grease -- 1 Tbsp has 14 mg.  Mayonnaise -- 1 Tbsp has 5-10 mg.  Margarine -- 1 Tbsp has 3-10 mg. Exact amounts of cholesterol in these foods may vary depending on specific ingredients and brands. Foods without cholesterol Most plant-based foods do not have cholesterol unless you combine them with a food that has cholesterol. Foods without  cholesterol include:  Grains and cereals.  Vegetables.  Fruits.  Vegetable oils, such as olive, canola, and sunflower oil.  Legumes, such as peas, beans, and lentils.  Nuts and seeds.  Egg whites. Summary  The body needs cholesterol in small amounts, but too much cholesterol can cause damage to the arteries and heart.  Most people should eat less than 200 milligrams (mg) of cholesterol a day. This information is not intended to replace advice given to you by your health care provider. Make sure you discuss any questions you have with your health care provider. Document Revised: 06/01/2017 Document Reviewed: 02/13/2017 Elsevier Patient Education  [place].

## 2020-03-28 DIAGNOSIS — Z794 Long term (current) use of insulin: Secondary | ICD-10-CM | POA: Diagnosis not present

## 2020-03-28 DIAGNOSIS — E1065 Type 1 diabetes mellitus with hyperglycemia: Secondary | ICD-10-CM | POA: Diagnosis not present

## 2020-04-15 ENCOUNTER — Other Ambulatory Visit: Payer: Self-pay

## 2020-04-15 ENCOUNTER — Telehealth (INDEPENDENT_AMBULATORY_CARE_PROVIDER_SITE_OTHER): Payer: Medicare Other | Admitting: Psychiatry

## 2020-04-15 DIAGNOSIS — F4010 Social phobia, unspecified: Secondary | ICD-10-CM | POA: Diagnosis not present

## 2020-04-15 DIAGNOSIS — F332 Major depressive disorder, recurrent severe without psychotic features: Secondary | ICD-10-CM

## 2020-04-15 DIAGNOSIS — G4701 Insomnia due to medical condition: Secondary | ICD-10-CM

## 2020-04-15 MED ORDER — TRAZODONE HCL 100 MG PO TABS: 400.0000 mg | ORAL_TABLET | Freq: Every day | ORAL | 0 refills | Status: DC

## 2020-04-15 MED ORDER — LAMOTRIGINE 100 MG PO TABS: 100.0000 mg | ORAL_TABLET | Freq: Two times a day (BID) | ORAL | 0 refills | Status: DC

## 2020-04-15 NOTE — Progress Notes (Signed)
Virtual Visit via Telephone Note  I connected with Leslie Lane on 04/15/20 at 10:00 AM EDT by telephone and verified that I am speaking with the correct person using two identifiers.  Location: Patient: home- currently in bed, watching tv Provider: office   I discussed the limitations, risks, security and privacy concerns of performing an evaluation and management service by telephone and the availability of in person appointments. I also discussed with the patient that there may be a patient responsible charge related to this service. The patient expressed understanding and agreed to proceed.   History of Present Illness: "It seems like it is getting worse". She has low energy and no motivation or desire to go out even for errands. Her personal hygiene is ok but she has to force to shower daily. Leslie Lane's concentration is poor. She feels awful. Despite being really tired she is unable to nap during the day. Leslie Lane's gets about 6-8 hrs of sleep with Trazodone. Her sleep is not restful. Her depression is slowly getting worse. Leslie Lane states she doesn't care about anything. She denies SI/HI. Her social anxiety is fine because she doesn't go out. Leslie Lane has a low frustration tolerance and gets irritated quickly. Her general anxiety is stable because she isolates and has no interest in anything.   Observations/Objective:  General Appearance: unable to assess  Eye Contact:  unable to assess  Speech:  Clear and Coherent and Normal Rate  Volume:  Normal  Mood:  Depressed  Affect:  Congruent  Thought Process:  Goal Directed and Descriptions of Associations: Circumstantial  Orientation:  Full (Time, Place, and Person)  Thought Content:  Logical  Suicidal Thoughts:  No  Homicidal Thoughts:  No  Memory:  Immediate;   Good  Judgement:  Poor  Insight:  Fair  Psychomotor Activity: unable to assess  Concentration:  Concentration: Good  Recall:  Good  Fund of Knowledge:  Good  Language:  Good   Akathisia:  unable to assess  Handed:  Right  AIMS (if indicated):     Assets:  Communication Skills Desire for Improvement Financial Resources/Insurance Housing Transportation Vocational/Educational  ADL's:  unable to assess  Cognition:  WNL  Sleep:         Assessment and Plan:  MDD- recurrent, severe without psychotic features; GAD; Social anxiety disorder; Insomnia; Nicotine use disorder.  Lamictal 100mg  po BID  Trazodone 400mg  po qHS  D/c Wellbutrin because she stopped taking a while ago because due to extra fatigue.   Leslie Lane declined treatment with stimulants for extreme fatigue and depression  Previous med trials were ineffective or stopped early due to side effects (Wellbutrin, Trintellix)  She is unable to afford TMS and ECT. Her mom could potentially drive her back and forth to treatment. Leslie Lane is unwilling to look into either option at this point and declined to have more talks about it.  After discussing other options Leslie Lane opted to continue with current meds and declined any changes.   Follow Up Instructions: In 2-3 months or sooner if needed   I discussed the assessment and treatment plan with the patient. The patient was provided an opportunity to ask questions and all were answered. The patient agreed with the plan and demonstrated an understanding of the instructions.   The patient was advised to call back or seek an in-person evaluation if the symptoms worsen or if the condition fails to improve as anticipated.  I provided 30 minutes of non-face-to-face time during this encounter.   Charlcie Cradle,  MD   

## 2020-04-16 ENCOUNTER — Ambulatory Visit (INDEPENDENT_AMBULATORY_CARE_PROVIDER_SITE_OTHER)
Admission: RE | Admit: 2020-04-16 | Discharge: 2020-04-16 | Disposition: A | Discharge status: Home / Self Care | Payer: Medicare Other | Source: Ambulatory Visit | Attending: Family Medicine | Admitting: Family Medicine

## 2020-04-16 ENCOUNTER — Encounter: Payer: Self-pay | Admitting: Family Medicine

## 2020-04-16 ENCOUNTER — Ambulatory Visit: Payer: Self-pay

## 2020-04-16 ENCOUNTER — Ambulatory Visit (INDEPENDENT_AMBULATORY_CARE_PROVIDER_SITE_OTHER): Payer: Medicare Other | Admitting: Family Medicine

## 2020-04-16 ENCOUNTER — Other Ambulatory Visit: Payer: Self-pay

## 2020-04-16 VITALS — BP 120/62 | HR 77 | Ht 64.0 in | Wt 106.6 lb

## 2020-04-16 DIAGNOSIS — S63634A Sprain of interphalangeal joint of right ring finger, initial encounter: Secondary | ICD-10-CM

## 2020-04-16 DIAGNOSIS — S6991XA Unspecified injury of right wrist, hand and finger(s), initial encounter: Secondary | ICD-10-CM | POA: Diagnosis not present

## 2020-04-16 DIAGNOSIS — M79641 Pain in right hand: Secondary | ICD-10-CM

## 2020-04-16 NOTE — Progress Notes (Signed)
   I, Wendy Poet, LAT, ATC, am serving as scribe for Dr. Lynne Leader.  Leslie Lane is a 64 y.o. female who presents to [place] at [facility] today for R 4th finger pain and triggering.  She was last seen by Dr. Georgina Snell on 09/23/19 for her L 4th finger and had a trigger finger injection.  Since then, pt reports R ring finger pain x 3-4 months that she injured when her blood sugar dropped and she passed out.  She locates her pain to her R PIP joint.  It has remained swollen.  Pertinent review of systems: No fevers or chills  Relevant historical information: Type 1 diabetes.  History of hypoglycemic episodes much better controlled now with continuous glucometer.   Exam:  BP 120/62 (BP Location: Left Arm, Patient Position: Sitting, Cuff Size: Normal)   Pulse 77   Ht 5\' 4"  (1.626 m)   Wt 106 lb 9.6 oz (48.4 kg)   SpO2 96%   BMI 18.30 kg/m  General: Well Developed, well nourished, and in no acute distress.   MSK: Right fourth digit PIP enlarged mildly tender palpation normal motion.  Strength intact.  Capillary fill and sensation are intact distally.    Lab and Radiology Results  X-ray images right fourth digit obtained today personally and independently interpreted No fracture or significant malalignment.  Tiny erosion seen at distal end of proximal phalanx ulnar corner Await formal radiology review  Diagnostic Limited MSK Ultrasound of: Right fourth digit PIP No visible fracture or cortex change.  Small joint effusion present. Impression: Joint effusion    Assessment and Plan: 64 y.o. female with right fourth digit injury occurring several months ago with resulting pain and swelling at PIP.  Likely traumatic synovitis.  Plan for Voltaren gel and hand therapy.  If not improving after a good trial of hand PT would proceed with steroid injection as next step however would like to avoid steroid injection given her diabetes.   PDMP not reviewed this  encounter. Orders Placed This Encounter  Procedures  . [place] LIMITED JOINT SPACE STRUCTURES UP RIGHT(NO LINKED CHARGES)    Order Specific Question:   Reason for Exam (SYMPTOM  OR DIAGNOSIS REQUIRED)    Answer:   R 4th finger pain    Order Specific Question:   Preferred imaging location?    Answer:   [place]  . DG Finger Ring Right    Standing Status:   Future    Number of Occurrences:   1    Standing Expiration Date:   04/16/2021    Order Specific Question:   Reason for Exam (SYMPTOM  OR DIAGNOSIS REQUIRED)    Answer:   eval PIP pain and swelling 3 months after fall    Order Specific Question:   Preferred imaging location?    Answer:   [facility]  . Ambulatory referral to Physical Therapy    Referral Priority:   Routine    Referral Type:   Physical Medicine    Referral Reason:   Specialty Services Required    Requested Specialty:   Physical Therapy    Number of Visits Requested:   1   No orders of the defined types were placed in this encounter.    Discussed warning signs or symptoms. Please see discharge instructions. Patient expresses understanding.   The above documentation has been reviewed and is accurate and complete Lynne Leader, M.D.

## 2020-04-16 NOTE — Patient Instructions (Addendum)
Thank you for coming in today.  I've referred you to Physical Therapy.  Let us know if you don't hear from them in one week.  Please get an Xray today before you leave  Please use voltaren gel up to 4x daily for pain as needed.   If not better I will do a cortisone shot.  Return if needed.   Think also about getting the ring re-sized.

## 2020-04-19 NOTE — Progress Notes (Signed)
X-ray ring finger shows no fracture

## 2020-04-20 ENCOUNTER — Ambulatory Visit (INDEPENDENT_AMBULATORY_CARE_PROVIDER_SITE_OTHER): Payer: Medicare Other | Admitting: Endocrinology

## 2020-04-20 ENCOUNTER — Other Ambulatory Visit: Payer: Self-pay

## 2020-04-20 ENCOUNTER — Encounter: Payer: Self-pay | Admitting: Endocrinology

## 2020-04-20 VITALS — BP 126/72 | HR 79 | Wt 105.6 lb

## 2020-04-20 DIAGNOSIS — R5382 Chronic fatigue, unspecified: Secondary | ICD-10-CM | POA: Diagnosis not present

## 2020-04-20 DIAGNOSIS — E1043 Type 1 diabetes mellitus with diabetic autonomic (poly)neuropathy: Secondary | ICD-10-CM | POA: Diagnosis not present

## 2020-04-20 DIAGNOSIS — E1065 Type 1 diabetes mellitus with hyperglycemia: Secondary | ICD-10-CM | POA: Diagnosis not present

## 2020-04-20 DIAGNOSIS — Z23 Encounter for immunization: Secondary | ICD-10-CM | POA: Diagnosis not present

## 2020-04-20 DIAGNOSIS — K3184 Gastroparesis: Secondary | ICD-10-CM | POA: Diagnosis not present

## 2020-04-20 NOTE — Progress Notes (Signed)
Patient ID: Leslie Lane, female   DOB: September 01, 1955, 64 y.o.   MRN: [id number]  Reason for Appointment: Endocrinology follow-up   History of Present Illness    Diagnosis: Type 1 DIABETES MELITUS, diagnosed 1967      She has had labile blood sugar control over the last several years even though A1c has been usually around 7% She has had less lability and hypoglycemia with taking b.i.d. Lantus compared once a day She has been very sensitive to fast acting insulin and frequently does not require mealtime coverage She cannot tolerate Toujeo because of reported episode of headache, bloating and nausea with the first dose  RECENT history:  Insulin regimen: Tresiba 8 units daily, Novolog 1-2 units up to 3 times a day   Her A1c is most recently 6.4  Current blood sugar patterns and problems identified:  She has been using the freestyle libre as before and she thinks that this is more accurate than before and not always giving falsely low readings  However she is still checking sugars with her Accu-Chek machine 4-5 times a day  With her freestyle libre meter being programmed to the wrong date difficult to know what her recent readings are  However she still appears to have relatively high readings on waking up and probably getting some rise in blood sugar early morning  She again does not follow instructions on how to prevent hypoglycemia which occurred periodically in the late afternoon  As before this is related to her being more active with walking, shopping or other activities  She is still not taking snacks before starting to be active  She will only treat hypoglycemia when her blood sugars are low with glucose tablets  She only this appears to be frequently reading falsely low especially with identifying hypoglycemia  Previously twice she was told to not take any NovoLog at dinnertime but she forgot and is still taking 1-2 units at the mealtime despite having low  normal readings frequently  She will sometimes have more snacks at bedtime which may be after midnight which are variable including chips  No recent severe hypoglycemia  Hypoglycemia: Symptoms may be absent with low blood sugars and does not have early warning symptoms. Gets confused and she frequently depends on her mother to recognize low sugars and treat them.  Her mother knows how to give Glucagon injection  She will treat her low blood sugars usually with juice, does carry glucose tablets with her      Blood sugars from Accu-Chek  PRE-MEAL Fasting Lunch Dinner Bedtime Overall  Glucose range:  65-214  51-141  47-79  41-167   Averages:  126  97  75  99  105   Previous data:  CGM use % of time  91  2-week average/SD  132, GV 39  Time in range      69%  % Time Above 180  20  % Time above 250   % Time Below 70  11     PRE-MEAL  6-8 AM Lunch Dinner Bedtime Overall  Glucose range:  44-216    100-271   Averages:  174  124  117  83    POST-MEAL PC Breakfast PC Lunch PC Dinner  Glucose range:     Averages:  128  140  94      Glycemic patterns summary: Blood sugars are averaging about 120 at midnight, gradually increasing to the highest level around 8 AM and then gradually declining  to the lowest level around 9 PM. Hypoglycemia is mostly after 6 PM     Wt Readings from Last 3 Encounters:  04/20/20 105 lb 9.6 oz (47.9 kg)  04/16/20 106 lb 9.6 oz (48.4 kg)  02/19/20 109 lb (49.4 kg)    Lab Results  Component Value Date   HGBA1C 6.4 02/17/2020   HGBA1C 7.1 (H) 12/16/2019   HGBA1C 6.6 (H) 09/16/2019   Lab Results  Component Value Date   MICROALBUR 1.9 09/16/2019   LDLCALC 89 06/16/2019   CREATININE 0.77 12/16/2019        Allergies as of 04/20/2020      Reactions   Penicillins Anaphylaxis   Sulfonamide Derivatives Anaphylaxis      Medication List       Accurate as of April 20, 2020  3:25 PM. If you have any questions, ask your nurse or doctor.         Accu-Chek Aviva Plus test strip Generic drug: glucose blood TEST BLOOD SUGAR FOUR TIMES DAILY   Accu-Chek Aviva Plus w/Device Kit USE AS DIRECTED   Accu-Chek Aviva Soln USE AS DIRECTED   Accu-Chek Softclix Lancets lancets Use as instructed to check blood sugar 4 times per day dx code E10.65   aspirin EC 81 MG tablet Take 81 mg by mouth every morning.   B-D SINGLE USE SWABS REGULAR Pads USE FOUR TIMES DAILY   B-D ULTRAFINE III SHORT PEN 31G X 8 MM Misc Generic drug: Insulin Pen Needle Use as instructed to inject insulin 3 times daily.   diclofenac Sodium 1 % Gel Commonly known as: VOLTAREN APPLY 2 GRAMS EXTERNALLY TO THE AFFECTED AREA FOUR TIMES DAILY   donepezil 10 MG tablet Commonly known as: ARICEPT TAKE 1/2 TABLET BY MOUTH DAILY FOR 2 WEEKS. INCREASE TO 1 TABLET DAILY AND. CONTINUE   Fish Oil 1000 MG Caps Take 1 capsule by mouth daily.   gabapentin 100 MG capsule Commonly known as: NEURONTIN TAKE 1 TO 2 CAPSULES(100 TO 200 MG) BY MOUTH TWICE DAILY   Gvoke HypoPen 2-Pack 1 MG/0.2ML Soaj Generic drug: Glucagon Inject 1 mg into the skin as needed. For Hypo   insulin lispro 100 UNIT/ML KwikPen Commonly known as: HumaLOG KwikPen Inject 10 units under the skin at breakfast, 8 units at dinner, and 1-3 units as needed for high blood sugars.   lamoTRIgine 100 MG tablet Commonly known as: LAMICTAL Take 1 tablet (100 mg total) by mouth 2 (two) times daily.   lidocaine 5 % Commonly known as: LIDODERM UNWRAP AND APPLY 1 PATCH TO SKIN EVERY DAY(REMOVE AND DISCARD PATCH WITHIN 12 HOURS OR AS DIRECTED BY DOCTOR)   lipase/protease/amylase 36000 UNITS Cpep capsule Commonly known as: Creon Take 2 capsules (72,000 Units total) by mouth 3 (three) times daily before meals.   loratadine 10 MG tablet Commonly known as: CLARITIN Take 10 mg by mouth as needed for allergies.   lovastatin 40 MG tablet Commonly known as: MEVACOR TAKE 1 TABLET(40 MG) BY MOUTH AT BEDTIME    Magnesium 500 MG Tabs Take 1 tablet by mouth as needed.   metoCLOPramide 5 MG tablet Commonly known as: REGLAN TAKE 1 TABLET BY MOUTH THREE TIMES DAILY BEFORE MEALS AS NEEDED   multivitamin tablet Take 1 tablet by mouth daily. Reported on 11/22/2015   omeprazole 40 MG capsule Commonly known as: PRILOSEC Take one capsule by mouth daily   traZODone 100 MG tablet Commonly known as: DESYREL Take 4 tablets (400 mg total) by mouth at bedtime.  Tyler Aas FlexTouch 100 UNIT/ML FlexTouch Pen Generic drug: insulin degludec INJECT 15 UNITS INTO THE SKIN DAILY.   vitamin B-12 1000 MCG tablet Commonly known as: CYANOCOBALAMIN Take 1 tablet by mouth daily.   vitamin E 1000 UNIT capsule Generic drug: vitamin E Take 1,000 Units by mouth daily.       Allergies:  Allergies  Allergen Reactions   Penicillins Anaphylaxis   Sulfonamide Derivatives Anaphylaxis    Past Medical History:  Diagnosis Date   ALLERGIC RHINITIS    Allergy    Anemia    Anxiety    Arthritis    BIPOLAR AFFECTIVE DISORDER    Cataract    DEPRESSION    Diabetes mellitus type I ([place])    DIABETES MELLITUS, TYPE II    follows with endo   Fibromyalgia    GERD    HEPATITIS B    HYPERLIPIDEMIA    LOW BACK PAIN, CHRONIC    MIGRAINE HEADACHE    NARCOTIC ABUSE    hx of   NECK PAIN, CHRONIC    Neuropathy    Seizures ([place])    pt states, "if my blood sugar drops to the 20's, I convulse.  It hasn't happened in a long time."   SMOKER     Past Surgical History:  Procedure Laterality Date   CESAREAN SECTION     COLONOSCOPY     TUBAL LIGATION     UPPER GASTROINTESTINAL ENDOSCOPY      Family History  Problem Relation Age of Onset   Arthritis Mother    Colon cancer Mother        ? age of dx   Arthritis Father    Kidney disease Father    Kidney cancer Father    Heart attack Brother    Bladder Cancer Sister    Hyperlipidemia Other    Kidney cancer Paternal Aunt     Anxiety disorder Neg Hx    Bipolar disorder Neg Hx    Depression Neg Hx    Breast cancer Neg Hx    Esophageal cancer Neg Hx    Stomach cancer Neg Hx    Rectal cancer Neg Hx     Social History:  reports that she has been smoking cigarettes. She has a 80.00 pack-year smoking history. She has never used smokeless tobacco. She reports current alcohol use. She reports that she does not use drugs.  Review of Systems:   Had gastroparesis treated with Reglan tid-qid, now usually has no nausea  NEUROPATHY: She has burning in her legs and feet at times treated with gabapentin 100 mg, she also thinks this helps the pains in her arms at night   DEPRESSION: She has had long-standing depression and anxiety on long-term treatment She says she feels lethargic during the day and even though she is supposed to take 400 mg of the Adderall she only takes 200 mg right at bedtime  HYPERLIPIDEMIA: The lipid abnormality consists of elevated LDL   Is  on lovastatin 40 mg with  good control   Lab Results  Component Value Date   CHOL 178 06/16/2019   HDL 80.80 06/16/2019   LDLCALC 89 06/16/2019   LDLDIRECT 101.7 03/26/2012   TRIG 45.0 06/16/2019   CHOLHDL 2 06/16/2019    Memory difficulties: She was Rx  Aricept by the neurologist, is taking 10 mg but ran out of the prescription refills   Examination:   BP 126/72    Pulse 79    Wt 105 lb 9.6  oz (47.9 kg)    SpO2 95%    BMI 18.13 kg/m   Body mass index is 18.13 kg/m.    ASSESSMENT/ PLAN:   Diabetes type 1 with poor control  See history of present illness for detailed discussion of current insulin, blood sugar patterns, problems identified  A1c has been under 7% and now 6.4  Her freestyle libre information cannot be utilized today because of the wrong date and time programmed However she appears to have fairly good blood sugars and relatively more accurate now Most likely has significant dawn phenomenon Again she has a tendency to low  sugars in the afternoons when she is more active and also unlikely needing any insulin coverage for her dinner meal She tends to forget her directions and has not followed any instructions given previously   Recommendations:  She will still use the Accu-Chek to correlate with her freestyle libre readings  However since her freestyle Elenor Legato is relatively more accurate will need to use this more regularly  Have changed the date on her reader to allow better download information on the next visit  Discussed that she needs to follow instructions for her insulin adjustment, snacks before exercise and avoiding mealtime coverage at dinnertime as previously discussed  Discussed peanut butter snacks or other balanced snacks before being active rather than waiting till blood sugar gets low  Stop taking NovoLog at dinnertime  No change in [place] 8 units  Consider Lantus twice daily if morning sugars continue to be high and use smaller doses in the evening  She will not take more than 1 unit of NovoLog extra for correction unless blood sugar is over 200 or eating a very large carbohydrate meal  Consider setting of the Dexcom if freestyle Elenor Legato is not as accurate on the next visit  Review blood sugar patterns again in 2 months  LIPIDS: Needs follow-up in the next visit  For her symptoms suggesting having over from the trazodone she will try to take her 200 mg dose at least 1 to 2 hours before going to bedtime, may be able to reduce the dose also  Patient Instructions  NO NOVOLOG AT SUPPER TIME  SNACK BEFORE GOING WALKING IN afternoon  Influenza vaccine given    [name] 04/20/2020, 3:25 PM

## 2020-04-20 NOTE — Patient Instructions (Addendum)
NO NOVOLOG AT SUPPER TIME  SNACK BEFORE GOING WALKING IN afternoon  Trazadone 1-2 hrs before bedtime

## 2020-04-22 ENCOUNTER — Telehealth: Payer: Self-pay | Admitting: Pharmacist

## 2020-04-22 NOTE — Progress Notes (Signed)
Chronic Care Management Pharmacy Assistant   Name: Leslie Lane  MRN: [id number] DOB: 05/07/1956  Reason for Encounter: Medication Review  Patient Questions:  1.  Have you seen any other providers since your last visit? Yes  2.  Any changes in your medicines or health? No   PCP : [name], MD  Allergies:   Allergies  Allergen Reactions  . Penicillins Anaphylaxis  . Sulfonamide Derivatives Anaphylaxis    Medications: Outpatient Encounter Medications as of 04/22/2020  Medication Sig Note  . Accu-Chek Softclix Lancets lancets Use as instructed to check blood sugar 4 times per day dx code E10.65   . Alcohol Swabs (B-D SINGLE USE SWABS REGULAR) PADS USE FOUR TIMES DAILY   . aspirin EC 81 MG tablet Take 81 mg by mouth every morning.   . Blood Glucose Calibration (ACCU-CHEK AVIVA) SOLN USE AS DIRECTED   . Blood Glucose Monitoring Suppl (ACCU-CHEK AVIVA PLUS) w/Device KIT USE AS DIRECTED   . diclofenac Sodium (VOLTAREN) 1 % GEL APPLY 2 GRAMS EXTERNALLY TO THE AFFECTED AREA FOUR TIMES DAILY   . donepezil (ARICEPT) 10 MG tablet TAKE 1/2 TABLET BY MOUTH DAILY FOR 2 WEEKS. INCREASE TO 1 TABLET DAILY AND. CONTINUE (Patient not taking: Reported on 04/20/2020)   . gabapentin (NEURONTIN) 100 MG capsule TAKE 1 TO 2 CAPSULES(100 TO 200 MG) BY MOUTH TWICE DAILY 01/21/2020: Taking as needed  . Glucagon (GVOKE HYPOPEN 2-PACK) 1 MG/0.2ML SOAJ Inject 1 mg into the skin as needed. For Hypo   . glucose blood (ACCU-CHEK AVIVA PLUS) test strip TEST BLOOD SUGAR FOUR TIMES DAILY   . insulin degludec (TRESIBA FLEXTOUCH) 100 UNIT/ML FlexTouch Pen INJECT 15 UNITS INTO THE SKIN DAILY.   Marland Kitchen insulin lispro (HUMALOG KWIKPEN) 100 UNIT/ML KwikPen Inject 10 units under the skin at breakfast, 8 units at dinner, and 1-3 units as needed for high blood sugars.   . Insulin Pen Needle (B-D ULTRAFINE III SHORT PEN) 31G X 8 MM MISC Use as instructed to inject insulin 3 times daily.   Marland Kitchen lamoTRIgine (LAMICTAL)  100 MG tablet Take 1 tablet (100 mg total) by mouth 2 (two) times daily.   Marland Kitchen lidocaine (LIDODERM) 5 % UNWRAP AND APPLY 1 PATCH TO SKIN EVERY DAY(REMOVE AND DISCARD PATCH WITHIN 12 HOURS OR AS DIRECTED BY DOCTOR)   . lipase/protease/amylase (CREON) 36000 UNITS CPEP capsule Take 2 capsules (72,000 Units total) by mouth 3 (three) times daily before meals.   Marland Kitchen loratadine (CLARITIN) 10 MG tablet Take 10 mg by mouth as needed for allergies.   Marland Kitchen lovastatin (MEVACOR) 40 MG tablet TAKE 1 TABLET(40 MG) BY MOUTH AT BEDTIME 03/25/2020: Pt refuses to take statins  . Magnesium 500 MG TABS Take 1 tablet by mouth as needed.   . metoCLOPramide (REGLAN) 5 MG tablet TAKE 1 TABLET BY MOUTH THREE TIMES DAILY BEFORE MEALS AS NEEDED   . Multiple Vitamin (MULTIVITAMIN) tablet Take 1 tablet by mouth daily. Reported on 11/22/2015   . Omega-3 Fatty Acids (FISH OIL) 1000 MG CAPS Take 1 capsule by mouth daily.   Marland Kitchen omeprazole (PRILOSEC) 40 MG capsule Take one capsule by mouth daily   . traZODone (DESYREL) 100 MG tablet Take 4 tablets (400 mg total) by mouth at bedtime.   . vitamin B-12 (CYANOCOBALAMIN) 1000 MCG tablet Take 1 tablet by mouth daily.   . vitamin E (VITAMIN E) 1000 UNIT capsule Take 1,000 Units by mouth daily.      No facility-administered encounter medications on  file as of 04/22/2020.    Current Diagnosis: Patient Active Problem List   Diagnosis Date Noted  . Other sprain of right middle finger, initial encounter 06/17/2019  . Acquired trigger finger of left ring finger 02/18/2019  . Right shoulder pain 02/18/2018  . Protein calorie malnutrition ([place]) 06/01/2017  . Routine general medical examination at a health care facility 06/01/2017  . WBC decreased 03/27/2017  . Diabetic gastroparesis associated with type 1 diabetes mellitus ([place]) 10/24/2016  . Abdominal pain 02/28/2016  . Insomnia 04/13/2015  . Major depressive disorder, recurrent, severe without psychotic features ([place]) 07/28/2014  . Generalized  anxiety disorder 04/27/2014  . Abnormal CT scan, bladder   . HEPATITIS B 07/05/2010  . Mixed diabetic hyperlipidemia associated with type 1 diabetes mellitus ([place]) 07/05/2010  . BIPOLAR AFFECTIVE DISORDER 07/05/2010  . Smokers' cough ([place]) 07/05/2010  . MIGRAINE HEADACHE 07/05/2010  . GERD 07/05/2010  . LOW BACK PAIN, CHRONIC 07/05/2010    Goals Addressed   None     Follow-Up:  Coordination of Enhanced Pharmacy Services   Reviewed chart for medication changes ahead of medication coordination call.  No OVs, Consults, or hospital visits since last care coordination call/Pharmacist visit.  No medication changes indicated  BP Readings from Last 3 Encounters:  04/20/20 126/72  04/16/20 120/62  02/19/20 130/60    Lab Results  Component Value Date   HGBA1C 6.4 02/17/2020     Patient obtains medications through Vials  90 Days   Last adherence delivery included:  Bd Ultra-Fine Short Pen needles  Accu-Chek Test strips Aviva  Tresiba Flex Injection 100 unit  Novolog Flexpen  U-100 Gabapentin 133m 1-2 capsules twice daily    Patient is due for next adherence delivery on: 04/26/2020. Called patient and reviewed medications and coordinated delivery.  This delivery to include:  Bd Ultra-Fine Short Pen needles  Accu-Chek Test strips Aviva  Tresiba Flex Injection 100 unit  Novolog Flexpen  U-100 Gabapentin 1058m1-2 capsules twice daily   Confirmed delivery date of 04/26/2020, advised patient that pharmacy will contact them the morning of delivery.  DeRosendo GrosNRRidges Surgery Center LLCPractice Team Manager/ CPA (Clinical Pharmacist Assistant) [phone]

## 2020-04-23 ENCOUNTER — Other Ambulatory Visit: Payer: Self-pay | Admitting: Internal Medicine

## 2020-04-28 DIAGNOSIS — E1065 Type 1 diabetes mellitus with hyperglycemia: Secondary | ICD-10-CM | POA: Diagnosis not present

## 2020-04-28 DIAGNOSIS — Z794 Long term (current) use of insulin: Secondary | ICD-10-CM | POA: Diagnosis not present

## 2020-05-13 ENCOUNTER — Telehealth: Payer: Self-pay | Admitting: Pharmacist

## 2020-05-13 NOTE — Progress Notes (Signed)
Chronic Care Management Pharmacy Assistant   Name: Leslie Lane  MRN: [id number] DOB: 01-06-1956  Reason for Encounter: Medication Review  Patient Questions:  1.  Have you seen any other providers since your last visit? No  2.  Any changes in your medicines or health? No    PCP : [name], MD  Allergies:   Allergies  Allergen Reactions  . Penicillins Anaphylaxis  . Sulfonamide Derivatives Anaphylaxis    Medications: Outpatient Encounter Medications as of 05/13/2020  Medication Sig Note  . Accu-Chek Softclix Lancets lancets Use as instructed to check blood sugar 4 times per day dx code E10.65   . Alcohol Swabs (B-D SINGLE USE SWABS REGULAR) PADS USE FOUR TIMES DAILY   . aspirin EC 81 MG tablet Take 81 mg by mouth every morning.   . Blood Glucose Calibration (ACCU-CHEK AVIVA) SOLN USE AS DIRECTED   . Blood Glucose Monitoring Suppl (ACCU-CHEK AVIVA PLUS) w/Device KIT USE AS DIRECTED   . diclofenac Sodium (VOLTAREN) 1 % GEL APPLY 2 GRAMS EXTERNALLY TO THE AFFECTED AREA FOUR TIMES DAILY   . donepezil (ARICEPT) 10 MG tablet TAKE 1/2 TABLET BY MOUTH DAILY FOR 2 WEEKS. INCREASE TO 1 TABLET DAILY AND. CONTINUE (Patient not taking: Reported on 04/20/2020)   . gabapentin (NEURONTIN) 100 MG capsule TAKE 1 TO 2 CAPSULES BY MOUTH TWICE DAILY   . Glucagon (GVOKE HYPOPEN 2-PACK) 1 MG/0.2ML SOAJ Inject 1 mg into the skin as needed. For Hypo   . glucose blood (ACCU-CHEK AVIVA PLUS) test strip TEST BLOOD SUGAR FOUR TIMES DAILY   . insulin degludec (TRESIBA FLEXTOUCH) 100 UNIT/ML FlexTouch Pen INJECT 15 UNITS INTO THE SKIN DAILY.   Marland Kitchen insulin lispro (HUMALOG KWIKPEN) 100 UNIT/ML KwikPen Inject 10 units under the skin at breakfast, 8 units at dinner, and 1-3 units as needed for high blood sugars.   . Insulin Pen Needle (B-D ULTRAFINE III SHORT PEN) 31G X 8 MM MISC Use as instructed to inject insulin 3 times daily.   Marland Kitchen lamoTRIgine (LAMICTAL) 100 MG tablet Take 1 tablet (100 mg  total) by mouth 2 (two) times daily.   Marland Kitchen lidocaine (LIDODERM) 5 % UNWRAP AND APPLY 1 PATCH TO SKIN EVERY DAY(REMOVE AND DISCARD PATCH WITHIN 12 HOURS OR AS DIRECTED BY DOCTOR)   . lipase/protease/amylase (CREON) 36000 UNITS CPEP capsule Take 2 capsules (72,000 Units total) by mouth 3 (three) times daily before meals.   Marland Kitchen loratadine (CLARITIN) 10 MG tablet Take 10 mg by mouth as needed for allergies.   Marland Kitchen lovastatin (MEVACOR) 40 MG tablet TAKE 1 TABLET(40 MG) BY MOUTH AT BEDTIME 03/25/2020: Pt refuses to take statins  . Magnesium 500 MG TABS Take 1 tablet by mouth as needed.   . metoCLOPramide (REGLAN) 5 MG tablet TAKE 1 TABLET BY MOUTH THREE TIMES DAILY BEFORE MEALS AS NEEDED   . Multiple Vitamin (MULTIVITAMIN) tablet Take 1 tablet by mouth daily. Reported on 11/22/2015   . Omega-3 Fatty Acids (FISH OIL) 1000 MG CAPS Take 1 capsule by mouth daily.   Marland Kitchen omeprazole (PRILOSEC) 40 MG capsule Take one capsule by mouth daily   . traZODone (DESYREL) 100 MG tablet Take 4 tablets (400 mg total) by mouth at bedtime.   . vitamin B-12 (CYANOCOBALAMIN) 1000 MCG tablet Take 1 tablet by mouth daily.   . vitamin E (VITAMIN E) 1000 UNIT capsule Take 1,000 Units by mouth daily.      No facility-administered encounter medications on file as of 05/13/2020.  Current Diagnosis: Patient Active Problem List   Diagnosis Date Noted  . Other sprain of right middle finger, initial encounter 06/17/2019  . Acquired trigger finger of left ring finger 02/18/2019  . Right shoulder pain 02/18/2018  . Protein calorie malnutrition ([place]) 06/01/2017  . Routine general medical examination at a health care facility 06/01/2017  . WBC decreased 03/27/2017  . Diabetic gastroparesis associated with type 1 diabetes mellitus ([place]) 10/24/2016  . Abdominal pain 02/28/2016  . Insomnia 04/13/2015  . Major depressive disorder, recurrent, severe without psychotic features ([place]) 07/28/2014  . Generalized anxiety disorder 04/27/2014  .  Abnormal CT scan, bladder   . HEPATITIS B 07/05/2010  . Mixed diabetic hyperlipidemia associated with type 1 diabetes mellitus ([place]) 07/05/2010  . BIPOLAR AFFECTIVE DISORDER 07/05/2010  . Smokers' cough ([place]) 07/05/2010  . MIGRAINE HEADACHE 07/05/2010  . GERD 07/05/2010  . LOW BACK PAIN, CHRONIC 07/05/2010    Goals Addressed   None     Follow-Up:  Coordination of Enhanced Pharmacy Services   Reviewed chart for medication changes ahead of medication coordination call.  No OVs, Consults, or hospital visits since last care coordination call/Pharmacist visit.  No medication changes indicated   BP Readings from Last 3 Encounters:  04/20/20 126/72  04/16/20 120/62  02/19/20 130/60    Lab Results  Component Value Date   HGBA1C 6.4 02/17/2020     Patient obtains medications through Vials  90 Days   Last adherence delivery included:   Trazodone 100 mg 1 tab daily bedtime Lamotrigine 100 mg 1 tab twice daily metoclopram 5 mg 1 tab 3 times daily as needed Omeprazole 40 mg 1 cap daily Gvoke Hypopen 2-pak inject 1 mg   Patient is due for next adherence delivery on: 05/21/2020. Called patient and reviewed medications and coordinated delivery.  This delivery to include: Trazodone 100 mg 1 tab daily bedtime Lamotrigine 100 mg 1 tab twice daily metoclopram 5 mg 1 tab 3 times daily as needed Omeprazole 40 mg 1 cap daily   Confirmed delivery date of 05/21/2020, advised patient that pharmacy will contact them the morning of delivery.   Wendy Poet, Clinical Pharmacist Assistant [facility]

## 2020-05-18 ENCOUNTER — Other Ambulatory Visit: Payer: Self-pay | Admitting: Endocrinology

## 2020-05-20 ENCOUNTER — Other Ambulatory Visit: Payer: Self-pay | Admitting: Endocrinology

## 2020-05-20 ENCOUNTER — Other Ambulatory Visit: Payer: Self-pay | Admitting: Internal Medicine

## 2020-05-29 DIAGNOSIS — Z794 Long term (current) use of insulin: Secondary | ICD-10-CM | POA: Diagnosis not present

## 2020-05-29 DIAGNOSIS — E1043 Type 1 diabetes mellitus with diabetic autonomic (poly)neuropathy: Secondary | ICD-10-CM | POA: Diagnosis not present

## 2020-06-10 ENCOUNTER — Ambulatory Visit (INDEPENDENT_AMBULATORY_CARE_PROVIDER_SITE_OTHER): Payer: Medicare Other | Admitting: Internal Medicine

## 2020-06-10 ENCOUNTER — Encounter: Payer: Self-pay | Admitting: Internal Medicine

## 2020-06-10 ENCOUNTER — Other Ambulatory Visit: Payer: Self-pay

## 2020-06-10 VITALS — BP 122/68 | Temp 98.2°F | Ht 64.0 in | Wt 109.0 lb

## 2020-06-10 DIAGNOSIS — E1069 Type 1 diabetes mellitus with other specified complication: Secondary | ICD-10-CM

## 2020-06-10 DIAGNOSIS — Z0000 Encounter for general adult medical examination without abnormal findings: Secondary | ICD-10-CM

## 2020-06-10 DIAGNOSIS — J410 Simple chronic bronchitis: Secondary | ICD-10-CM | POA: Diagnosis not present

## 2020-06-10 DIAGNOSIS — K3184 Gastroparesis: Secondary | ICD-10-CM | POA: Diagnosis not present

## 2020-06-10 DIAGNOSIS — E1043 Type 1 diabetes mellitus with diabetic autonomic (poly)neuropathy: Secondary | ICD-10-CM

## 2020-06-10 DIAGNOSIS — E782 Mixed hyperlipidemia: Secondary | ICD-10-CM

## 2020-06-10 LAB — CBC
HCT: 36.7 % (ref 36.0–46.0)
Hemoglobin: 12 g/dL (ref 12.0–15.0)
MCHC: 32.5 g/dL (ref 30.0–36.0)
MCV: 84.2 fl (ref 78.0–100.0)
Platelets: 237 10*3/uL (ref 150.0–400.0)
RBC: 4.37 Mil/uL (ref 3.87–5.11)
RDW: 16.5 % — ABNORMAL HIGH (ref 11.5–15.5)
WBC: 4.4 10*3/uL (ref 4.0–10.5)

## 2020-06-10 LAB — COMPREHENSIVE METABOLIC PANEL
ALT: 19 U/L (ref 0–35)
AST: 25 U/L (ref 0–37)
Albumin: 4.3 g/dL (ref 3.5–5.2)
Alkaline Phosphatase: 44 U/L (ref 39–117)
BUN: 24 mg/dL — ABNORMAL HIGH (ref 6–23)
CO2: 34 mEq/L — ABNORMAL HIGH (ref 19–32)
Calcium: 9.7 mg/dL (ref 8.4–10.5)
Chloride: 101 mEq/L (ref 96–112)
Creatinine, Ser: 0.77 mg/dL (ref 0.40–1.20)
GFR: 80.54 mL/min (ref >60.00)
Glucose, Bld: 61 mg/dL — ABNORMAL LOW (ref 70–99)
Potassium: 4.2 mEq/L (ref 3.5–5.1)
Sodium: 140 mEq/L (ref 135–145)
Total Bilirubin: 0.5 mg/dL (ref 0.2–1.2)
Total Protein: 6.9 g/dL (ref 6.0–8.3)

## 2020-06-10 LAB — LIPID PANEL
Cholesterol: 156 mg/dL (ref 0–200)
HDL: 76.7 mg/dL (ref >39.00)
LDL Cholesterol: 66 mg/dL (ref 0–99)
NonHDL: 79.63
Total CHOL/HDL Ratio: 2
Triglycerides: 68 mg/dL (ref 0.0–149.0)
VLDL: 13.6 mg/dL (ref 0.0–40.0)

## 2020-06-10 LAB — MICROALBUMIN / CREATININE URINE RATIO
Creatinine,U: 22 mg/dL
Microalb Creat Ratio: 4.4 mg/g (ref 0.0–30.0)
Microalb, Ur: 1 mg/dL (ref 0.0–1.9)

## 2020-06-10 LAB — HEMOGLOBIN A1C: Hgb A1c MFr Bld: 6.1 % (ref 4.6–6.5)

## 2020-06-10 NOTE — Patient Instructions (Addendum)
Make sure to get the covid-19 booster shot as soon as possible.     Health Maintenance, Female Adopting a healthy lifestyle and getting preventive care are important in promoting health and wellness. Ask your health care provider about:  The right schedule for you to have regular tests and exams.  Things you can do on your own to prevent diseases and keep yourself healthy. What should I know about diet, weight, and exercise? Eat a healthy diet   Eat a diet that includes plenty of vegetables, fruits, low-fat dairy products, and lean protein.  Do not eat a lot of foods that are high in solid fats, added sugars, or sodium. Maintain a healthy weight Body mass index (BMI) is used to identify weight problems. It estimates body fat based on height and weight. Your health care provider can help determine your BMI and help you achieve or maintain a healthy weight. Get regular exercise Get regular exercise. This is one of the most important things you can do for your health. Most adults should:  Exercise for at least 150 minutes each week. The exercise should increase your heart rate and make you sweat (moderate-intensity exercise).  Do strengthening exercises at least twice a week. This is in addition to the moderate-intensity exercise.  Spend less time sitting. Even light physical activity can be beneficial. Watch cholesterol and blood lipids Have your blood tested for lipids and cholesterol at 66 years of age, then have this test every 5 years. Have your cholesterol levels checked more often if:  Your lipid or cholesterol levels are high.  You are older than 66 years of age.  You are at high risk for heart disease. What should I know about cancer screening? Depending on your health history and family history, you may need to have cancer screening at various ages. This may include screening for:  Breast cancer.  Cervical cancer.  Colorectal cancer.  Skin cancer.  Lung  cancer. What should I know about heart disease, diabetes, and high blood pressure? Blood pressure and heart disease  High blood pressure causes heart disease and increases the risk of stroke. This is more likely to develop in people who have high blood pressure readings, are of African descent, or are overweight.  Have your blood pressure checked: ? Every 3-5 years if you are 27-55 years of age. ? Every year if you are 6 years old or older. Diabetes Have regular diabetes screenings. This checks your fasting blood sugar level. Have the screening done:  Once every three years after age 67 if you are at a normal weight and have a low risk for diabetes.  More often and at a younger age if you are overweight or have a high risk for diabetes. What should I know about preventing infection? Hepatitis B If you have a higher risk for hepatitis B, you should be screened for this virus. Talk with your health care provider to find out if you are at risk for hepatitis B infection. Hepatitis C Testing is recommended for:  Everyone born from [birth year] through 1967.  Anyone with known risk factors for hepatitis C. Sexually transmitted infections (STIs)  Get screened for STIs, including gonorrhea and chlamydia, if: ? You are sexually active and are younger than 66 years of age. ? You are older than 66 years of age and your health care provider tells you that you are at risk for this type of infection. ? Your sexual activity has changed since you were last screened,  and you are at increased risk for chlamydia or gonorrhea. Ask your health care provider if you are at risk.  Ask your health care provider about whether you are at high risk for HIV. Your health care provider may recommend a prescription medicine to help prevent HIV infection. If you choose to take medicine to prevent HIV, you should first get tested for HIV. You should then be tested every 3 months for as long as you are taking the  medicine. Pregnancy  If you are about to stop having your period (premenopausal) and you may become pregnant, seek counseling before you get pregnant.  Take 400 to 800 micrograms (mcg) of folic acid every day if you become pregnant.  Ask for birth control (contraception) if you want to prevent pregnancy. Osteoporosis and menopause Osteoporosis is a disease in which the bones lose minerals and strength with aging. This can result in bone fractures. If you are 43 years old or older, or if you are at risk for osteoporosis and fractures, ask your health care provider if you should:  Be screened for bone loss.  Take a calcium or vitamin D supplement to lower your risk of fractures.  Be given hormone replacement therapy (HRT) to treat symptoms of menopause. Follow these instructions at home: Lifestyle  Do not use any products that contain nicotine or tobacco, such as cigarettes, e-cigarettes, and chewing tobacco. If you need help quitting, ask your health care provider.  Do not use street drugs.  Do not share needles.  Ask your health care provider for help if you need support or information about quitting drugs. Alcohol use  Do not drink alcohol if: ? Your health care provider tells you not to drink. ? You are pregnant, may be pregnant, or are planning to become pregnant.  If you drink alcohol: ? Limit how much you use to 0-1 drink a day. ? Limit intake if you are breastfeeding.  Be aware of how much alcohol is in your drink. In the [place] one drink equals one 12 oz bottle of beer (355 mL), one 5 oz glass of wine (148 mL), or one 1 oz glass of hard liquor (44 mL). General instructions  Schedule regular health, dental, and eye exams.  Stay current with your vaccines.  Tell your health care provider if: ? You often feel depressed. ? You have ever been abused or do not feel safe at home. Summary  Adopting a healthy lifestyle and getting preventive care are important in  promoting health and wellness.  Follow your health care provider's instructions about healthy diet, exercising, and getting tested or screened for diseases.  Follow your health care provider's instructions on monitoring your cholesterol and blood pressure. This information is not intended to replace advice given to you by your health care provider. Make sure you discuss any questions you have with your health care provider. Document Revised: 06/12/2018 Document Reviewed: 06/12/2018 Elsevier Patient Education  2020 [employer].

## 2020-06-10 NOTE — Progress Notes (Signed)
Subjective:   Patient ID: Leslie Lane, female    DOB: 02-Apr-1956, 64 y.o.   MRN: [id number]  HPI Here for medicare wellness and physical, no new complaints. Please see A/P for status and treatment of chronic medical problems.   Diet: DM since diabetic Physical activity: sedentary Depression/mood screen: treated by psych Hearing: intact to whispered voice, mild loss bilaterally no difficulty Visual acuity: grossly normal with lens, performs annual eye exam  ADLs: capable Fall risk: low Home safety: good Cognitive evaluation: intact to orientation, naming, recall and repetition EOL planning: adv directives discussed  [facility] Office Visit from 06/10/2020 in [place] at [place] Office Visit from 06/10/2020 in [place] at [facility]  PHQ-9 Total Score 0     I have personally reviewed and have noted 1. The patient's medical and social history - reviewed today no changes 2. Their use of alcohol, tobacco or illicit drugs 3. Their current medications and supplements 4. The patient's functional ability including ADL's, fall risks, home safety risks and hearing or visual impairment. 5. Diet and physical activities 6. Evidence for depression or mood disorders 7. Care team reviewed and updated  Patient Care Team: [name], MD as PCP - General (Internal Medicine) [name], MD as Consulting Physician (Endocrinology) Amil Amen, MD as Consulting Physician (Psychiatry) Ricard Dillon, MD as Consulting Physician (Psychiatry) Ladene Artist, MD (Gastroenterology) Franchot Gallo, MD (Urology) Cameron Sprang, MD as Consulting Physician (Neurology) Charlton Haws, [facility] as Pharmacist (Pharmacist) Past Medical History:  Diagnosis Date  . ALLERGIC RHINITIS   . Allergy   . Anemia   . Anxiety   . Arthritis   . BIPOLAR AFFECTIVE DISORDER   . Cataract   . DEPRESSION   .  Diabetes mellitus type I ([place])   . DIABETES MELLITUS, TYPE II    follows with endo  . Fibromyalgia   . GERD   . HEPATITIS B   . HYPERLIPIDEMIA   . LOW BACK PAIN, CHRONIC   . MIGRAINE HEADACHE   . NARCOTIC ABUSE    hx of  . NECK PAIN, CHRONIC   . Neuropathy   . Seizures ([place])    pt states, "if my blood sugar drops to the 20's, I convulse.  It hasn't happened in a long time."  . SMOKER    Past Surgical History:  Procedure Laterality Date  . CESAREAN SECTION    . COLONOSCOPY    . TUBAL LIGATION    . UPPER GASTROINTESTINAL ENDOSCOPY     Family History  Problem Relation Age of Onset  . Arthritis Mother   . Colon cancer Mother        ? age of dx  . Arthritis Father   . Kidney disease Father   . Kidney cancer Father   . Heart attack Brother   . Bladder Cancer Sister   . Hyperlipidemia Other   . Kidney cancer Paternal Aunt   . Anxiety disorder Neg Hx   . Bipolar disorder Neg Hx   . Depression Neg Hx   . Breast cancer Neg Hx   . Esophageal cancer Neg Hx   . Stomach cancer Neg Hx   . Rectal cancer Neg Hx    Review of Systems  Constitutional: Positive for appetite change.  HENT: Negative.   Eyes: Negative.   Respiratory: Negative for cough, chest tightness and shortness of breath.   Cardiovascular:  Negative for chest pain, palpitations and leg swelling.  Gastrointestinal: Positive for abdominal pain. Negative for abdominal distention, constipation, diarrhea, nausea and vomiting.  Musculoskeletal: Positive for myalgias.  Skin: Negative.   Neurological: Positive for numbness.  Psychiatric/Behavioral: Positive for decreased concentration and dysphoric mood.    Objective:  Physical Exam Constitutional:      Appearance: She is well-developed and well-nourished.  HENT:     Head: Normocephalic and atraumatic.  Eyes:     Extraocular Movements: EOM normal.  Cardiovascular:     Rate and Rhythm: Normal rate and regular rhythm.  Pulmonary:     Effort: Pulmonary effort is  normal. No respiratory distress.     Breath sounds: Normal breath sounds. No wheezing or rales.  Abdominal:     General: Bowel sounds are normal. There is no distension.     Palpations: Abdomen is soft.     Tenderness: There is no abdominal tenderness. There is no rebound.  Musculoskeletal:        General: No edema.     Cervical back: Normal range of motion.  Skin:    General: Skin is warm and dry.     Comments: Foot exam done  Neurological:     Mental Status: She is alert and oriented to person, place, and time.     Coordination: Coordination normal.  Psychiatric:        Mood and Affect: Mood and affect normal.     Vitals:   06/10/20 1306  BP: 122/68  Temp: 98.2 F (36.8 C)  TempSrc: Oral  Weight: 109 lb (49.4 kg)  Height: 5\' 4"  (1.626 m)    This visit occurred during the SARS-CoV-2 public health emergency.  Safety protocols were in place, including screening questions prior to the visit, additional usage of staff PPE, and extensive cleaning of exam room while observing appropriate contact time as indicated for disinfecting solutions.   Assessment & Plan:

## 2020-06-11 NOTE — Assessment & Plan Note (Signed)
Foot exam done and checking Hga1c. Uses monitor to check for low sugars given severe episodes of this in the past. Endo manages for her and she will follow with them.

## 2020-06-11 NOTE — Assessment & Plan Note (Signed)
Not taking her lovastatin she is prescribed. Checking lipid panel and adjust as needed.

## 2020-06-11 NOTE — Assessment & Plan Note (Signed)
Flu shot up to date. Covid-19 2 shots advised booster. Pneumonia complete. Shingrix counseled. Tetanus due 2023. Colonoscopy due 2023. Mammogram due 2022, pap smear aged out and dexa due declines. Counseled about sun safety and mole surveillance. Counseled about the dangers of distracted driving. Given 10 year screening recommendations.

## 2020-06-11 NOTE — Assessment & Plan Note (Signed)
Asked to make quit attempt and she is not able to do this at this time. Reminded of risk and harm from smoking.

## 2020-06-14 ENCOUNTER — Telehealth: Payer: Self-pay | Admitting: Pharmacist

## 2020-06-14 NOTE — Progress Notes (Addendum)
Chronic Care Management Pharmacy Assistant   Name: Leslie Lane  MRN: [id number] DOB: Nov 09, 1955  Reason for Encounter: Medication Review  Patient Questions:  1.  Have you seen any other providers since your last visit? Yes, patient last seen Dr. Sharlet Salina on 06/10/20  2.  Any changes in your medicines or health? No    PCP : [name], MD  Allergies:   Allergies  Allergen Reactions   Penicillins Anaphylaxis   Sulfonamide Derivatives Anaphylaxis    Medications: Outpatient Encounter Medications as of 06/14/2020  Medication Sig Note   Accu-Chek Softclix Lancets lancets Use as instructed to check blood sugar 4 times per day dx code E10.65    Alcohol Swabs (B-D SINGLE USE SWABS REGULAR) PADS USE FOUR TIMES DAILY    aspirin EC 81 MG tablet Take 81 mg by mouth every morning.    Blood Glucose Calibration (ACCU-CHEK AVIVA) SOLN USE AS DIRECTED    Blood Glucose Monitoring Suppl (ACCU-CHEK AVIVA PLUS) w/Device KIT USE AS DIRECTED    diclofenac Sodium (VOLTAREN) 1 % GEL APPLY 2 GRAMS EXTERNALLY TO THE AFFECTED AREA FOUR TIMES DAILY    donepezil (ARICEPT) 10 MG tablet TAKE 1/2 TABLET BY MOUTH DAILY FOR 2 WEEKS. INCREASE TO 1 TABLET DAILY AND. CONTINUE    gabapentin (NEURONTIN) 100 MG capsule TAKE 1 TO 2 CAPSULES BY MOUTH TWICE DAILY    glucose blood (ACCU-CHEK AVIVA PLUS) test strip TEST BLOOD SUGAR FOUR TIMES DAILY    GVOKE HYPOPEN 2-PACK 1 MG/0.2ML SOAJ Inject 1 mg into the skin as needed. For Hypo    insulin degludec (TRESIBA FLEXTOUCH) 100 UNIT/ML FlexTouch Pen INJECT 15 UNITS INTO THE SKIN DAILY.    insulin lispro (HUMALOG KWIKPEN) 100 UNIT/ML KwikPen Inject 10 units under the skin at breakfast, 8 units at dinner, and 1-3 units as needed for high blood sugars.    Insulin Pen Needle (B-D ULTRAFINE III SHORT PEN) 31G X 8 MM MISC Use as instructed to inject insulin 3 times daily.    lamoTRIgine (LAMICTAL) 100 MG tablet Take 1 tablet (100 mg total) by mouth 2 (two) times  daily.    lidocaine (LIDODERM) 5 % UNWRAP AND APPLY 1 PATCH TO SKIN EVERY DAY(REMOVE AND DISCARD PATCH WITHIN 12 HOURS OR AS DIRECTED BY DOCTOR)    lipase/protease/amylase (CREON) 36000 UNITS CPEP capsule Take 2 capsules (72,000 Units total) by mouth 3 (three) times daily before meals.    loratadine (CLARITIN) 10 MG tablet Take 10 mg by mouth as needed for allergies.    lovastatin (MEVACOR) 40 MG tablet TAKE 1 TABLET(40 MG) BY MOUTH AT BEDTIME 03/25/2020: Pt refuses to take statins   Magnesium 500 MG TABS Take 1 tablet by mouth as needed.    metoCLOPramide (REGLAN) 5 MG tablet TAKE 1 TABLET THREE TIMES DAILY BEFORE MEALS AS NEEDED    Multiple Vitamin (MULTIVITAMIN) tablet Take 1 tablet by mouth daily. Reported on 11/22/2015    Omega-3 Fatty Acids (FISH OIL) 1000 MG CAPS Take 1 capsule by mouth daily.    omeprazole (PRILOSEC) 40 MG capsule TAKE ONE CAPSULE BY MOUTH ONCE DAILY    traZODone (DESYREL) 100 MG tablet Take 4 tablets (400 mg total) by mouth at bedtime.    vitamin B-12 (CYANOCOBALAMIN) 1000 MCG tablet Take 1 tablet by mouth daily.    vitamin E 1000 UNIT capsule Take 1,000 Units by mouth daily.    No facility-administered encounter medications on file as of 06/14/2020.    Current Diagnosis: Patient  Active Problem List   Diagnosis Date Noted   Acquired trigger finger of left ring finger 02/18/2019   Right shoulder pain 02/18/2018   Protein calorie malnutrition ([place]) 06/01/2017   Routine general medical examination at a health care facility 06/01/2017   WBC decreased 03/27/2017   Diabetic gastroparesis associated with type 1 diabetes mellitus ([place]) 10/24/2016   Abdominal pain 02/28/2016   Insomnia 04/13/2015   Major depressive disorder, recurrent, severe without psychotic features ([place]) 07/28/2014   Generalized anxiety disorder 04/27/2014   Abnormal CT scan, bladder    HEPATITIS B 07/05/2010   Mixed diabetic hyperlipidemia associated with type 1 diabetes mellitus ([place]) 07/05/2010    BIPOLAR AFFECTIVE DISORDER 07/05/2010   Smokers' cough ([place]) 07/05/2010   MIGRAINE HEADACHE 07/05/2010   GERD 07/05/2010   LOW BACK PAIN, CHRONIC 07/05/2010    Goals Addressed   None     Follow-Up:  Coordination of Enhanced Pharmacy Services   Reviewed chart for medication changes ahead of medication coordination call.  No OVs, Consults, or hospital visits since last care coordination call/Pharmacist visit. (If appropriate, list visit date, provider name)  No medication changes indicated OR if recent visit, treatment plan here.  BP Readings from Last 3 Encounters:  06/10/20 122/68  04/20/20 126/72  04/16/20 120/62    Lab Results  Component Value Date   HGBA1C 6.1 06/10/2020     Patient obtains medications through Vials  90 Days   Last adherence delivery included: Trazodone 100 mg 1 tab daily bedtime Lamotrigine 100 mg 1 tab twice daily metoclopram 5 mg 1 tab 3 times daily as needed Omeprazole 40 mg 1 cap daily Gvoke Hypopen 2-pak inject 1 mg into skin as needed  Patient is due for next adherence delivery on: 06/18/2020. Called patient and reviewed medications and coordinated delivery.  This delivery to include: Gvoke Hypopen 2-pak inject 1 mg into skin as needed   Patient declined the following medications  Trazodone 100 mg 1 tab daily bedtime Lamotrigine 100 mg 1 tab twice daily metoclopram 5 mg 1 tab 3 times daily as needed Omeprazole 40 mg 1 cap daily Lovastatin - declines to take any longer Patient states that she has enough to last until January  Confirmed delivery date of 06/18/2020, advised patient that pharmacy will contact them the morning of delivery.   Wendy Poet, Clinical Pharmacist Assistant [facility]

## 2020-06-16 NOTE — Addendum Note (Signed)
Addended by: Charlton Haws on: 06/16/2020 10:18 PM   Modules accepted: Orders

## 2020-06-29 DIAGNOSIS — E1043 Type 1 diabetes mellitus with diabetic autonomic (poly)neuropathy: Secondary | ICD-10-CM | POA: Diagnosis not present

## 2020-06-29 DIAGNOSIS — Z794 Long term (current) use of insulin: Secondary | ICD-10-CM | POA: Diagnosis not present

## 2020-07-06 ENCOUNTER — Other Ambulatory Visit: Payer: Self-pay

## 2020-07-06 ENCOUNTER — Ambulatory Visit: Payer: Medicare Other | Admitting: Pharmacist

## 2020-07-06 DIAGNOSIS — E10649 Type 1 diabetes mellitus with hypoglycemia without coma: Secondary | ICD-10-CM

## 2020-07-06 DIAGNOSIS — F332 Major depressive disorder, recurrent severe without psychotic features: Secondary | ICD-10-CM

## 2020-07-06 DIAGNOSIS — F17210 Nicotine dependence, cigarettes, uncomplicated: Secondary | ICD-10-CM

## 2020-07-06 DIAGNOSIS — E1065 Type 1 diabetes mellitus with hyperglycemia: Secondary | ICD-10-CM

## 2020-07-06 DIAGNOSIS — E1069 Type 1 diabetes mellitus with other specified complication: Secondary | ICD-10-CM

## 2020-07-06 NOTE — Chronic Care Management (AMB) (Unsigned)
Chronic Care Management Pharmacy  Name: Leslie Lane  MRN: [id number] DOB: [birth year]   Chief Complaint/ HPI  Leslie Lane,  65 y.o. , female presents for their Follow-Up CCM visit with the clinical pharmacist via telephone due to COVID-19 Pandemic.  PCP : [name], MD  Their chronic conditions include: Hyperlipidemia, Type 1 Diabetes, COPD and Tobacco use, Bipolar disorder, depression/anxiety, GERD, hx Hepatitis B  Subjective: Patient lives with mother, she gives glucagon shot if needed. Brother and 2 sisters, sister visits about once a week.    Office Visits:  06/10/20 Dr Sharlet Salina OV: pt stopped statin in October, LDL is 33. No mec changes.  12/16/19 Dr Sharlet Salina OV: f/u depression. Trintellix gave her headaches, stopped after 7 days. Refer back to psych.  Consult Visit: 02/19/20 Dr Dwyane Dee (endocrine): CGM giving false lows, rec'd to correlate with Accu-Chek. Stop Novolog at bedtime. No change in [place] unless AM sugars low. Avoid Novolog > 1 unit unless BG > 200.   01/22/20 Dr Doyne Keel (psych): stopped Trintellix d/t fatigue. Started Bupropion XL 150 mg for depression, smoking cessation.  12/19/19 Dr Dwyane Dee (endocrine): not a good candidate for insulin pump. Stop Humalog at dinnertime due to evening lows. Needs simple sugars when she is more active to avoid lows (not sugar-free cookies or peanut butter). Avoid high fat snacks late evening.  11/27/19 Dr Doyne Keel (psych): previous med trial ineffective. Trial Trintellix 5 mg.  09/18/19 Dr Dwyane Dee (endocrine): frequent low sugars, down to 40s, with hypoglycemia unawareness. Low sugar usually related to increased activity in afternoons, she still takes Novolog at lunch. Recs: move [place] to dinnertime, cut back on high-carb snacks at bedtime, eliminate mealtime coverage at lunch, may not need at dinnertime unless significant carb load. Pt to call re: Freestyle Libre 2.   08/28/19 Dr Doyne Keel (psych): pt unwilling to try new meds  d/t fear of SE. Finances limited, strict budget.   07/31/19 Dr Anselm Pancoast (neurology): memory loss, started after diabetic coma at 89. [place] 14/30. MRI 01/2019 negative acute changes. Started donepezil and pt reports still having memory loss. Several pillboxes at home but does not use, sometimes forgets meds. Ordered home sleep study.   06/18/19 Dr Dwyane Dee (endocrine): labile BG although A1c is controlled. Cannot tolerate Toujeo d/t headache, bloating, nausea. Significant issues with hypoglycemia. Thinking about Freestyle Libre 2 due to low alert capability. Would not be good candidate for insulin pump.  06/17/19 Dr Tamala Julian (sports medicine): trigger finger, receiving prn injections.  05/15/19 Dr Doyne Keel (psych): discussed other med options, pt declined all options d/t cost or fear of SE, though about low-dose Ritalin but pt declined, pt stated "I guess I will just stay feeling like I am feeling".   Allergies  Allergen Reactions  . Penicillins Anaphylaxis  . Sulfonamide Derivatives Anaphylaxis   Medications: Outpatient Encounter Medications as of 07/06/2020  Medication Sig  . Accu-Chek Softclix Lancets lancets Use as instructed to check blood sugar 4 times per day dx code E10.65  . Alcohol Swabs (B-D SINGLE USE SWABS REGULAR) PADS USE FOUR TIMES DAILY  . aspirin EC 81 MG tablet Take 81 mg by mouth every morning.  . Blood Glucose Calibration (ACCU-CHEK AVIVA) SOLN USE AS DIRECTED  . Blood Glucose Monitoring Suppl (ACCU-CHEK AVIVA PLUS) w/Device KIT USE AS DIRECTED  . diclofenac Sodium (VOLTAREN) 1 % GEL APPLY 2 GRAMS EXTERNALLY TO THE AFFECTED AREA FOUR TIMES DAILY  . donepezil (ARICEPT) 10 MG tablet TAKE 1/2 TABLET BY MOUTH DAILY FOR 2 WEEKS.  INCREASE TO 1 TABLET DAILY AND. CONTINUE  . gabapentin (NEURONTIN) 100 MG capsule TAKE 1 TO 2 CAPSULES BY MOUTH TWICE DAILY  . glucose blood (ACCU-CHEK AVIVA PLUS) test strip TEST BLOOD SUGAR FOUR TIMES DAILY  . GVOKE HYPOPEN 2-PACK 1 MG/0.2ML SOAJ Inject 1 mg  into the skin as needed. For Hypo  . insulin degludec (TRESIBA FLEXTOUCH) 100 UNIT/ML FlexTouch Pen INJECT 15 UNITS INTO THE SKIN DAILY.  Marland Kitchen insulin lispro (HUMALOG KWIKPEN) 100 UNIT/ML KwikPen Inject 10 units under the skin at breakfast, 8 units at dinner, and 1-3 units as needed for high blood sugars.  . Insulin Pen Needle (B-D ULTRAFINE III SHORT PEN) 31G X 8 MM MISC Use as instructed to inject insulin 3 times daily.  Marland Kitchen lamoTRIgine (LAMICTAL) 100 MG tablet Take 1 tablet (100 mg total) by mouth 2 (two) times daily.  Marland Kitchen lidocaine (LIDODERM) 5 % UNWRAP AND APPLY 1 PATCH TO SKIN EVERY DAY(REMOVE AND DISCARD PATCH WITHIN 12 HOURS OR AS DIRECTED BY DOCTOR)  . lipase/protease/amylase (CREON) 36000 UNITS CPEP capsule Take 2 capsules (72,000 Units total) by mouth 3 (three) times daily before meals.  Marland Kitchen loratadine (CLARITIN) 10 MG tablet Take 10 mg by mouth as needed for allergies.  Marland Kitchen lovastatin (MEVACOR) 40 MG tablet Take 40 mg by mouth at bedtime.  . Magnesium 500 MG TABS Take 1 tablet by mouth as needed.  . metoCLOPramide (REGLAN) 5 MG tablet TAKE 1 TABLET THREE TIMES DAILY BEFORE MEALS AS NEEDED  . Multiple Vitamin (MULTIVITAMIN) tablet Take 1 tablet by mouth daily. Reported on 11/22/2015  . Omega-3 Fatty Acids (FISH OIL) 1000 MG CAPS Take 1 capsule by mouth daily.  Marland Kitchen omeprazole (PRILOSEC) 40 MG capsule TAKE ONE CAPSULE BY MOUTH ONCE DAILY  . traZODone (DESYREL) 100 MG tablet Take 4 tablets (400 mg total) by mouth at bedtime.  . vitamin B-12 (CYANOCOBALAMIN) 1000 MCG tablet Take 1 tablet by mouth daily.  . vitamin E 1000 UNIT capsule Take 1,000 Units by mouth daily.   No facility-administered encounter medications on file as of 07/06/2020.   Wt Readings from Last 3 Encounters:  06/10/20 109 lb (49.4 kg)  04/20/20 105 lb 9.6 oz (47.9 kg)  04/16/20 106 lb 9.6 oz (48.4 kg)   Lab Results  Component Value Date   CREATININE 0.77 06/10/2020   BUN 24 (H) 06/10/2020   GFR 80.54 06/10/2020   GFRNONAA >60  05/02/2015   GFRAA >60 05/02/2015   NA 140 06/10/2020   K 4.2 06/10/2020   CALCIUM 9.7 06/10/2020   CO2 34 (H) 06/10/2020    Current Diagnosis/Assessment:  Goals Addressed            This Visit's Progress   . Pharmacy Care Plan       CARE PLAN ENTRY (see longitudinal plan of care for additional care plan information)  Current Barriers:  . Chronic Disease Management support, education, and care coordination needs related to Hyperlipidemia, Diabetes, Depression, Anxiety, and Tobacco use  Hyperlipidemia / Cardiovascular risk reduction Lab Results  Component Value Date/Time   LDLCALC 66 06/10/2020 01:43 PM   LDLDIRECT 101.7 03/26/2012 10:48 AM .  Pharmacist Clinical Goal(s): o Over the next 90 days, patient will work with PharmD and providers to maintain LDL goal < 100 . Current regimen:  o lovastatin 40 mg at bedtime o aspirin 81 mg daily,  o fish oil OTC . Interventions: o Discussed cholesterol goals and benefits of medication for prevention of heart attack / stroke o Discussed  benefits of statin for stroke prevention outweigh an risk of side effects . Patient self care activities - Over the next 90 days, patient will: o Continue medications as prescribed  Diabetes (Type 1) Lab Results  Component Value Date/Time   HGBA1C 6.1 06/10/2020 01:43 PM   HGBA1C 6.4 02/17/2020 02:10 PM .  Pharmacist Clinical Goal(s): o Over the next 90 days, patient will work with PharmD and providers to achieve A1c goal <7% . Current regimen:  o Tresiba 8 units every morning o Humalog 1 units with meals o Freestyle Libre 2 . Interventions: o Discussed Humalog and Novolog are essentially interchangeable with very similar onsets and durations of action . Patient self care activities - Over the next 90 days, patient will: o Check blood sugar via [employer], and provide at future appointments o Contact provider with any episodes of hypoglycemia o Take Humalog/Novolog 1 unit with meals.  Avoid taking a second dose of Humalog within 3-4 hours   Depression/Anxiety . Pharmacist Clinical Goal(s) o Over the next 90 days, patient will work with PharmD and providers to optimize therapy . Current regimen:  o Lamotrigine 100 mg twice a day o Trazodone 100 mg at bedtime as needed . Interventions: o Patient could not tolerate bupropion; recommended to follow up with psychiatrist for next steps . Patient self care activities - Over the next 90 days, patient will: o Follow up with psychiatry as scheduled  Smoking cessation . Pharmacist Clinical Goal(s) o Over the next 90 days, patient will work with PharmD and providers to reduce cigarette use . Current regimen:  o No medications. Smoking 2 pack-per-day . Interventions: o Provided [place] Quit Line number for more resources and counseling . Patient self care activities - Over the next 90 days, patient will: o Contact [place] Quit Line (1-800-QUIT-NOW or 225-618-5621)  Medication management . Pharmacist Clinical Goal(s): o Over the next 90 days, patient will work with PharmD and providers to achieve optimal medication adherence . Current pharmacy: [facility] . Interventions o Comprehensive medication review performed. o Utilize [facility] pharmacy for medication synchronization, packaging and delivery . Patient self care activities - Over the next 90 days, patient will: o Focus on medication adherence by fill date o Take medications as prescribed o Report any questions or concerns to PharmD and/or provider(s)  Please see past updates related to this goal by clicking on the "Past Updates" button in the selected goal        Diabetes (Type 1)   A1c goal < 7% without hypoglycemia  Recent Relevant Labs: Lab Results  Component Value Date/Time   HGBA1C 6.1 06/10/2020 01:43 PM   HGBA1C 6.4 02/17/2020 02:10 PM   MICROALBUR 1.0 06/10/2020 01:43 PM   MICROALBUR 1.9 09/16/2019 10:28 AM    Last diabetic eye exam:  Lab Results   Component Value Date/Time   HMDIABEYEEXA No Retinopathy 08/11/2019 12:00 AM    Last diabetic foot exam: No results found for: HMDIABFOOTEX   Checking BG: via Freestyle Libre 2.  BG range: "Up and down"  Patient has failed these meds in past: Lantus, Toujeo,  Patient is currently controlled on the following medications:   Tresiba 8 units daily - AM  Novolog/Humalog 1-3 units TID w/ meals  Gvoke Hypopen PRN  Freestyle Libre  We discussed: Pt reports she has not needed to use GVOKE in a "while"; she has an excess supply of Novolog and is using that up before switching to Humalog.   Plan  Continue current medications and  control with diet and exercise     Hyperlipidemia   LDL goal < 100  Lipid Panel     Component Value Date/Time   CHOL 156 06/10/2020 1343   TRIG 68.0 06/10/2020 1343   HDL 76.70 06/10/2020 1343   CHOLHDL 2 06/10/2020 1343   VLDL 13.6 06/10/2020 1343   LDLCALC 66 06/10/2020 1343   LDLDIRECT 101.7 03/26/2012 1048   The 10-year ASCVD risk score Mikey Bussing DC Jr., et al., 2013) is: 14.8%   Values used to calculate the score:     Age: 77 years     Sex: Female     Is Non-Hispanic African American: No     Diabetic: Yes     Tobacco smoker: Yes     Systolic Blood Pressure: 893 mmHg     Is BP treated: No     HDL Cholesterol: 76.7 mg/dL     Total Cholesterol: 156 mg/dL   Patient has failed these meds in past: simvastatin Patient is currently controlled on the following medications:   aspirin 81 mg daily,   fish oil OTC  Lovastatin 40 mg daily  We discussed:  Pt had previously wanted to stop statin because she heard statins are "bad for the brain", discussed benefits of statins for MI/stroke prevention, consequences of stroke, pt agreed statin benefit outweighs risk and will continue to take  Plan  Continue current medication  Tobacco Abuse   Tobacco Status:  Social History   Tobacco Use  Smoking Status Current Every Day Smoker  . Packs/day:  2.00  . Years: 40.00  . Pack years: 80.00  . Types: Cigarettes  Smokeless Tobacco Never Used   Patient smokes Within 30 minutes of waking  reports MOTIVATION to quit is high  reports CONFIDENCE in quitting is low "I have no will power"   Previous quit attempts: tried Nicotine patch, Chantix (weird dreams) Did quit for 2 weeks before cataract surgery once, felt very irritable and on-edge the whole time.  Patient has failed these meds in past: Chantix (dreams), Nicotine patch, bupropion Patient is currently uncontrolled on the following medications:  . No medications  We discussed: pt did not tolerate bupropion (see above); Provided contact information for [place] Quit Line (1-800-QUIT-NOW) and encouraged patient to reach out to this group for support. Pt has not yet called the Quit Line, reiterated benefits of smoking cessation and pt agreed to call.  Plan  Contact [place] Quit Line for counseling   Bipolar depression / anxiety / insomnia   Depression screen [facility] 2/9 06/10/2020 06/17/2019 03/20/2019  Decreased Interest 0 1 0  Down, Depressed, Hopeless 0 1 0  PHQ - 2 Score 0 2 0  Altered sleeping 0 2 -  Tired, decreased energy 0 3 -  Change in appetite 0 0 -  Feeling bad or failure about yourself  0 3 -  Trouble concentrating 0 3 -  Moving slowly or fidgety/restless 0 3 -  Suicidal thoughts 0 1 -  PHQ-9 Score 0 17 -  Difficult doing work/chores - - -  Some recent data might be hidden   Patient has failed these meds in past: n/a Patient is currently uncontrolled on the following medications:  . Trazodone 100 mg daily  We discussed:  Pt still reports fatigue throughout the day; she reports exercise/movement does give her more energy; advised she make exercise a part of her daily routine, even just walking for a few minutes a day  Plan  Continue current medications and control  with diet and exercise  Medication Management   Pt uses [facility] for all medications Uses pill  box? No - organizes bottles Pt endorses compliance  We discussed: Reviewed patient's [facility] medication and Epic medication profile assuring there are no discrepancies or gaps in therapy. Confirmed all fill dates appropriate and verified with patient that there is a sufficient quantity of all prescribed medications at home. Informed patient to call me any time if needing medications before scheduled deliveries.    Plan  Utilize [facility] pharmacy for medication synchronization, packaging and delivery     Follow up: 3 month phone visit  Charlene Brooke, PharmD, [facility] Clinical Pharmacist [place] [facility] at [facility] [phone]

## 2020-07-07 NOTE — Patient Instructions (Signed)
Visit Information  Phone number for Pharmacist: [phone]  Goals Addressed            This Visit's Progress   . Pharmacy Care Plan       CARE PLAN ENTRY (see longitudinal plan of care for additional care plan information)  Current Barriers:  . Chronic Disease Management support, education, and care coordination needs related to Hyperlipidemia, Diabetes, Depression, Anxiety, and Tobacco use  Hyperlipidemia / Cardiovascular risk reduction Lab Results  Component Value Date/Time   LDLCALC 66 06/10/2020 01:43 PM   LDLDIRECT 101.7 03/26/2012 10:48 AM .  Pharmacist Clinical Goal(s): o Over the next 90 days, patient will work with PharmD and providers to maintain LDL goal < 100 . Current regimen:  o lovastatin 40 mg at bedtime o aspirin 81 mg daily,  o fish oil OTC . Interventions: o Discussed cholesterol goals and benefits of medication for prevention of heart attack / stroke o Discussed benefits of statin for stroke prevention outweigh an risk of side effects . Patient self care activities - Over the next 90 days, patient will: o Continue medications as prescribed  Diabetes (Type 1) Lab Results  Component Value Date/Time   HGBA1C 6.1 06/10/2020 01:43 PM   HGBA1C 6.4 02/17/2020 02:10 PM .  Pharmacist Clinical Goal(s): o Over the next 90 days, patient will work with PharmD and providers to achieve A1c goal <7% . Current regimen:  o Tresiba 8 units every morning o Humalog 1 units with meals o Freestyle Libre 2 . Interventions: o Discussed Humalog and Novolog are essentially interchangeable with very similar onsets and durations of action . Patient self care activities - Over the next 90 days, patient will: o Check blood sugar via [employer], and provide at future appointments o Contact provider with any episodes of hypoglycemia o Take Humalog/Novolog 1 unit with meals. Avoid taking a second dose of Humalog within 3-4 hours   Depression/Anxiety . Pharmacist Clinical  Goal(s) o Over the next 90 days, patient will work with PharmD and providers to optimize therapy . Current regimen:  o Lamotrigine 100 mg twice a day o Trazodone 100 mg at bedtime as needed . Interventions: o Patient could not tolerate bupropion; recommended to follow up with psychiatrist for next steps . Patient self care activities - Over the next 90 days, patient will: o Follow up with psychiatry as scheduled  Smoking cessation . Pharmacist Clinical Goal(s) o Over the next 90 days, patient will work with PharmD and providers to reduce cigarette use . Current regimen:  o No medications. Smoking 2 pack-per-day . Interventions: o Provided [place] Quit Line number for more resources and counseling . Patient self care activities - Over the next 90 days, patient will: o Contact [place] Quit Line (1-800-QUIT-NOW or (312)123-3699)  Medication management . Pharmacist Clinical Goal(s): o Over the next 90 days, patient will work with PharmD and providers to achieve optimal medication adherence . Current pharmacy: [facility] . Interventions o Comprehensive medication review performed. o Utilize [facility] pharmacy for medication synchronization, packaging and delivery . Patient self care activities - Over the next 90 days, patient will: o Focus on medication adherence by fill date o Take medications as prescribed o Report any questions or concerns to PharmD and/or provider(s)  Please see past updates related to this goal by clicking on the "Past Updates" button in the selected goal       The patient verbalized understanding of instructions, educational materials, and care plan provided today and declined offer to  receive copy of patient instructions, educational materials, and care plan.  Telephone follow up appointment with pharmacy team member scheduled for: 3 months  Charlene Brooke, PharmD, [facility] Clinical Pharmacist [place] [facility] at [facility] [phone]

## 2020-07-08 ENCOUNTER — Telehealth (HOSPITAL_COMMUNITY): Payer: Medicare Other | Admitting: Psychiatry

## 2020-07-08 ENCOUNTER — Other Ambulatory Visit: Payer: Self-pay

## 2020-07-08 DIAGNOSIS — G4701 Insomnia due to medical condition: Secondary | ICD-10-CM

## 2020-07-08 DIAGNOSIS — F4010 Social phobia, unspecified: Secondary | ICD-10-CM

## 2020-07-08 DIAGNOSIS — F332 Major depressive disorder, recurrent severe without psychotic features: Secondary | ICD-10-CM

## 2020-07-08 MED ORDER — LAMOTRIGINE 100 MG PO TABS: 100.0000 mg | ORAL_TABLET | Freq: Two times a day (BID) | ORAL | 0 refills | Status: DC

## 2020-07-08 MED ORDER — TRAZODONE HCL 100 MG PO TABS: 400.0000 mg | ORAL_TABLET | Freq: Every day | ORAL | 0 refills | Status: DC

## 2020-07-08 MED ORDER — SERTRALINE HCL 50 MG PO TABS: 50.0000 mg | ORAL_TABLET | Freq: Every day | ORAL | 0 refills | Status: DC

## 2020-07-08 NOTE — Progress Notes (Unsigned)
Depression is mostly unchanged but is able to force herself to do things even though her motivation is low. It doesn't seem to be responding to Wellbutrin at all. She notes an increase in irritability.  Leslie Lane denies SI/HI.  Her sleep is good with Trazodone.  Leslie Lane notes that she tends to eat a lot of junk food at the beginning of the night. It's usually due to feeling bored and having nothing else to do.   P: Stop Wellbutrin Start Zoloft 50mg  qD. She had a good response in the past with Zoloft.

## 2020-07-12 ENCOUNTER — Other Ambulatory Visit: Payer: Self-pay | Admitting: Endocrinology

## 2020-07-13 ENCOUNTER — Telehealth: Payer: Self-pay | Admitting: Endocrinology

## 2020-07-13 ENCOUNTER — Telehealth: Payer: Self-pay | Admitting: Pharmacist

## 2020-07-13 NOTE — Telephone Encounter (Signed)
Humalog KwikPen dose will be same as NovoLog

## 2020-07-13 NOTE — Progress Notes (Signed)
Chronic Care Management Pharmacy Assistant   Name: Leslie Lane  MRN: [id number] DOB: [birth year]  Reason for Encounter: Medication Review  Patient Questions:  1.  Have you seen any other providers since your last visit? Yes, patient spoke with Dr. Doyne Keel by televisit on 07/08/20  2.  Any changes in your medicines or health? Yes, Dr. Doyne Keel prescribed Zoloft 50 mg    PCP : [name], MD  Allergies:   Allergies  Allergen Reactions   Penicillins Anaphylaxis   Sulfonamide Derivatives Anaphylaxis    Medications: Outpatient Encounter Medications as of 07/13/2020  Medication Sig   sertraline (ZOLOFT) 50 MG tablet Take 1 tablet (50 mg total) by mouth daily.   Accu-Chek Softclix Lancets lancets Use as instructed to check blood sugar 4 times per day dx code E10.65   Alcohol Swabs (B-D SINGLE USE SWABS REGULAR) PADS USE FOUR TIMES DAILY   aspirin EC 81 MG tablet Take 81 mg by mouth every morning.   Blood Glucose Calibration (ACCU-CHEK AVIVA) SOLN USE AS DIRECTED   Blood Glucose Monitoring Suppl (ACCU-CHEK AVIVA PLUS) w/Device KIT USE AS DIRECTED   diclofenac Sodium (VOLTAREN) 1 % GEL APPLY 2 GRAMS EXTERNALLY TO THE AFFECTED AREA FOUR TIMES DAILY   donepezil (ARICEPT) 10 MG tablet TAKE 1/2 TABLET BY MOUTH DAILY FOR 2 WEEKS. INCREASE TO 1 TABLET DAILY AND. CONTINUE   gabapentin (NEURONTIN) 100 MG capsule TAKE 1 TO 2 CAPSULES BY MOUTH TWICE DAILY   glucose blood (ACCU-CHEK AVIVA PLUS) test strip TEST BLOOD SUGAR FOUR TIMES DAILY   GVOKE HYPOPEN 2-PACK 1 MG/0.2ML SOAJ Inject 1 mg into the skin as needed. For Hypo   insulin degludec (TRESIBA FLEXTOUCH) 100 UNIT/ML FlexTouch Pen INJECT 15 UNITS INTO THE SKIN DAILY.   insulin lispro (HUMALOG KWIKPEN) 100 UNIT/ML KwikPen Inject 10 units under the skin at breakfast, 8 units at dinner, and 1-3 units as needed for high blood sugars.   Insulin Pen Needle (B-D ULTRAFINE III SHORT PEN) 31G X 8 MM MISC Use as instructed  to inject insulin 3 times daily.   lamoTRIgine (LAMICTAL) 100 MG tablet Take 1 tablet (100 mg total) by mouth 2 (two) times daily.   lidocaine (LIDODERM) 5 % UNWRAP AND APPLY 1 PATCH TO SKIN EVERY DAY(REMOVE AND DISCARD PATCH WITHIN 12 HOURS OR AS DIRECTED BY DOCTOR)   lipase/protease/amylase (CREON) 36000 UNITS CPEP capsule Take 2 capsules (72,000 Units total) by mouth 3 (three) times daily before meals.   loratadine (CLARITIN) 10 MG tablet Take 10 mg by mouth as needed for allergies.   lovastatin (MEVACOR) 40 MG tablet Take 40 mg by mouth at bedtime.   Magnesium 500 MG TABS Take 1 tablet by mouth as needed.   metoCLOPramide (REGLAN) 5 MG tablet TAKE 1 TABLET THREE TIMES DAILY BEFORE MEALS AS NEEDED   Multiple Vitamin (MULTIVITAMIN) tablet Take 1 tablet by mouth daily. Reported on 11/22/2015   NOVOLOG FLEXPEN 100 UNIT/ML FlexPen inject 10 UNITS UNDER THE SKIN EVERY MORNING AND inject EIGHT UNITS EVERY EVENING PER sliding scale. inject 1-3 UNITS AS NEEDED FOR high blood sugars   Omega-3 Fatty Acids (FISH OIL) 1000 MG CAPS Take 1 capsule by mouth daily.   omeprazole (PRILOSEC) 40 MG capsule TAKE ONE CAPSULE BY MOUTH ONCE DAILY   traZODone (DESYREL) 100 MG tablet Take 4 tablets (400 mg total) by mouth at bedtime.   vitamin B-12 (CYANOCOBALAMIN) 1000 MCG tablet Take 1 tablet by mouth daily.   vitamin E 1000 UNIT  capsule Take 1,000 Units by mouth daily.   No facility-administered encounter medications on file as of 07/13/2020.    Current Diagnosis: Patient Active Problem List   Diagnosis Date Noted   Acquired trigger finger of left ring finger 02/18/2019   Right shoulder pain 02/18/2018   Protein calorie malnutrition ([place]) 06/01/2017   Routine general medical examination at a health care facility 06/01/2017   WBC decreased 03/27/2017   Diabetic gastroparesis associated with type 1 diabetes mellitus ([place]) 10/24/2016   Abdominal pain 02/28/2016   Insomnia 04/13/2015    Major depressive disorder, recurrent, severe without psychotic features ([place]) 07/28/2014   Generalized anxiety disorder 04/27/2014   Abnormal CT scan, bladder    HEPATITIS B 07/05/2010   Mixed diabetic hyperlipidemia associated with type 1 diabetes mellitus ([place]) 07/05/2010   BIPOLAR AFFECTIVE DISORDER 07/05/2010   Smokers' cough ([place]) 07/05/2010   MIGRAINE HEADACHE 07/05/2010   GERD 07/05/2010   LOW BACK PAIN, CHRONIC 07/05/2010    Goals Addressed   None     Follow-Up:  Coordination of Enhanced Pharmacy Services   Reviewed chart for medication changes ahead of medication coordination call.  No OVs, Consults, or hospital visits since last care coordination call/Pharmacist visit. No medication changes indicated  BP Readings from Last 3 Encounters:  06/10/20 122/68  04/20/20 126/72  04/16/20 120/62    Lab Results  Component Value Date   HGBA1C 6.1 06/10/2020     Patient obtains medications through Vials  90 Days   Last adherence delivery included:  Trazodone 100 mg 1 tab daily bedtime Lamotrigine 100 mg 1 tab twice daily metoclopram 5 mg 1 tab 3 times daily as needed Omeprazole 40 mg 1 cap daily Gvoke Hypopen 2-pak inject 1 mg   Patient is due for next adherence delivery on: 07/16/2020. Called patient and reviewed medications and coordinated delivery.  This delivery to include: Gvoke Hypopen 2-pack inject 1 mg Insulin Lispro 100 units inject 10 units under the skin at breakfast, 8 units at dinner, and 1-3 units as needed for high blood sugar Accu chek test strips test blood sugar four times daily Bd Ultra fine short pen needles use as instructed to inject insulin 4 times daily as needed Lovastatin 40 mg 1 tab by mouth at bedtime   Confirmed delivery date of 07/16/2020, advised patient that pharmacy will contact them the morning of delivery.   Wendy Poet, [place] [phone]

## 2020-07-13 NOTE — Telephone Encounter (Signed)
Novolog is not covered by patient's insurance - humalog is covered if we can please call this into:  [facility] - [place], [place] - [address]  [address], [place] [address]  Phone:  [phone] Fax:  [phone]

## 2020-07-13 NOTE — Telephone Encounter (Signed)
Please advise on dose to be sent in for patient

## 2020-07-14 MED ORDER — INSULIN LISPRO (1 UNIT DIAL) 100 UNIT/ML (KWIKPEN): PEN_INJECTOR | SUBCUTANEOUS | 1 refills | Status: DC

## 2020-07-14 NOTE — Telephone Encounter (Signed)
Contacted pt and verified that Humalog needed to be sent in. Pt confirmed that insurance will not cover the Novolog but they will cover the Humalog.   Erx sent with the same sig as the Novolog per Dr. Dwyane Dee previous message.

## 2020-07-14 NOTE — Addendum Note (Signed)
Addended by: Aviva Signs M on: 07/14/2020 10:55 AM   Modules accepted: Orders

## 2020-07-15 ENCOUNTER — Other Ambulatory Visit: Payer: Self-pay | Admitting: Internal Medicine

## 2020-07-20 IMAGING — MG DIGITAL SCREENING BILAT W/ TOMO W/ CAD
9 series · 9 of 25 positions shown · non-contrast
Comparison: Previous exam(s).

CLINICAL DATA: Screening.

EXAM:
DIGITAL SCREENING BILATERAL MAMMOGRAM WITH TOMO AND CAD

[R MLO synth-2D (1 of 2)]
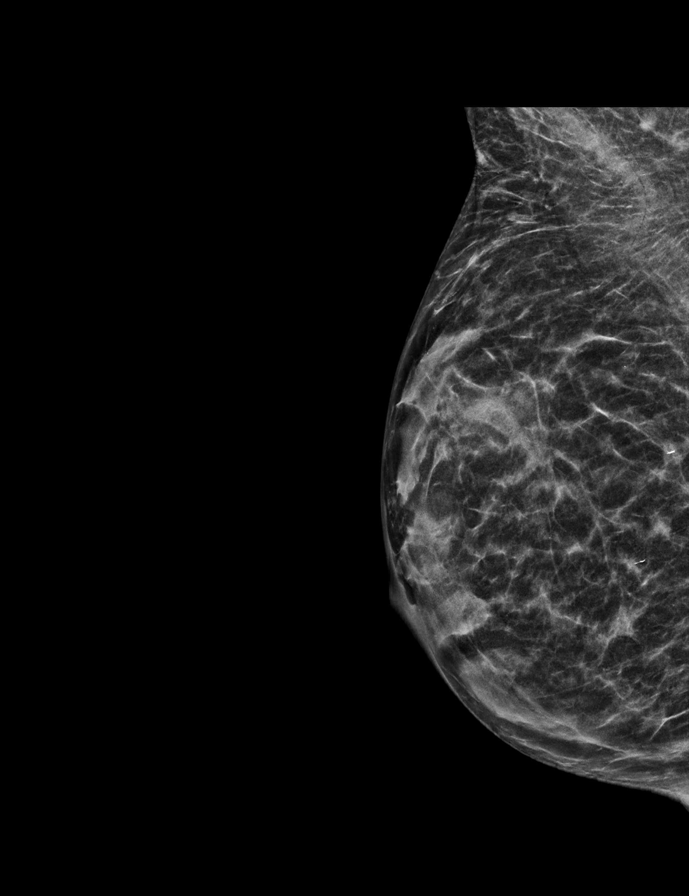

[R CC synth-2D]
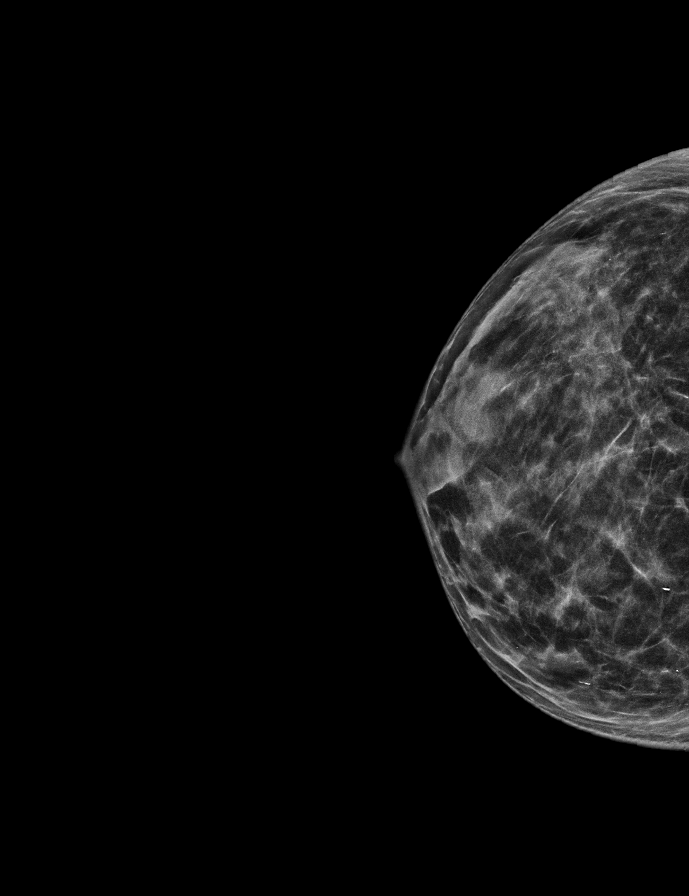

[L MLO synth-2D]
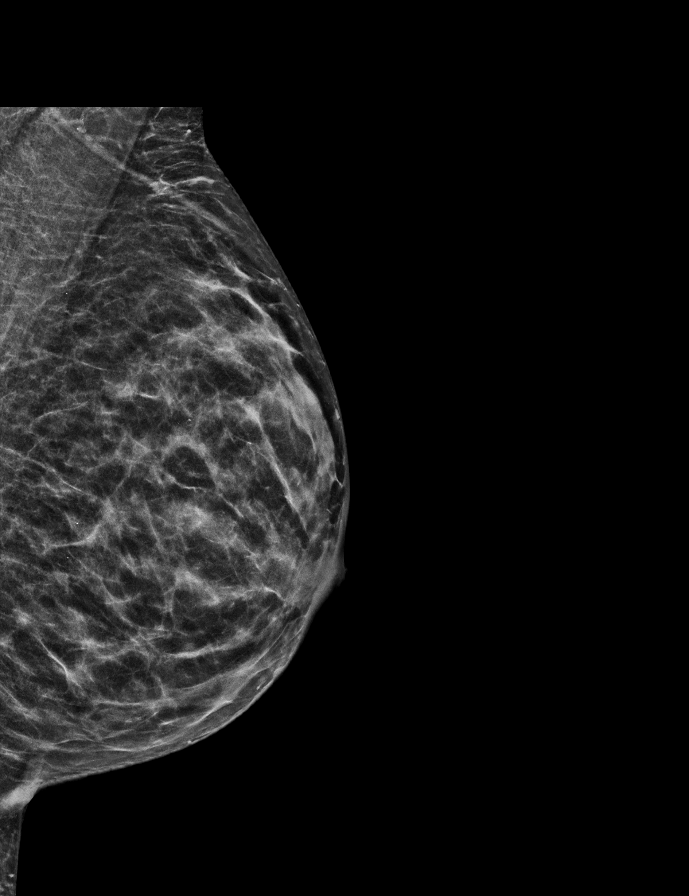

[R MLO synth-2D (2 of 2)]
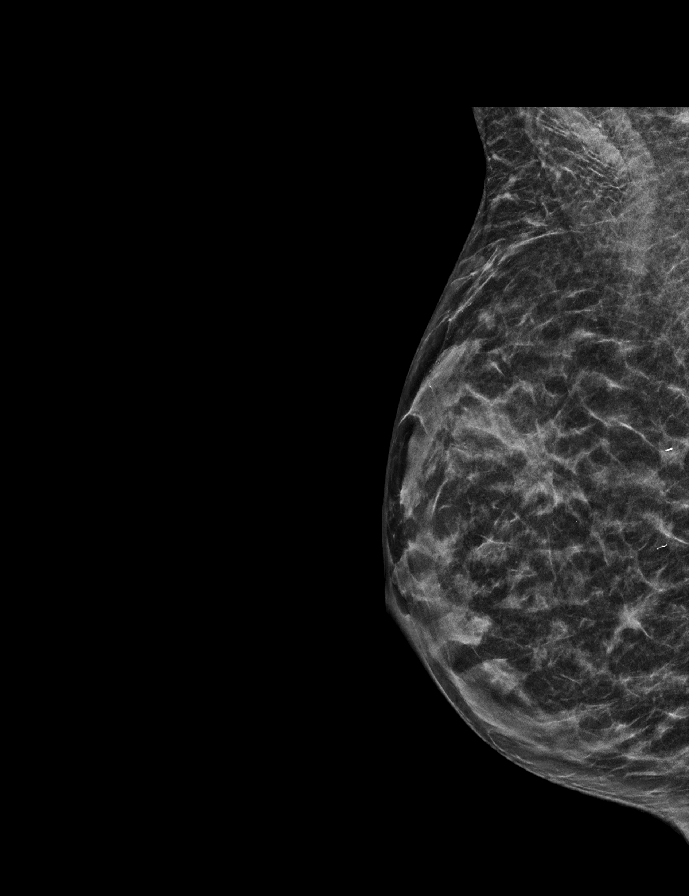

[L CC synth-2D]
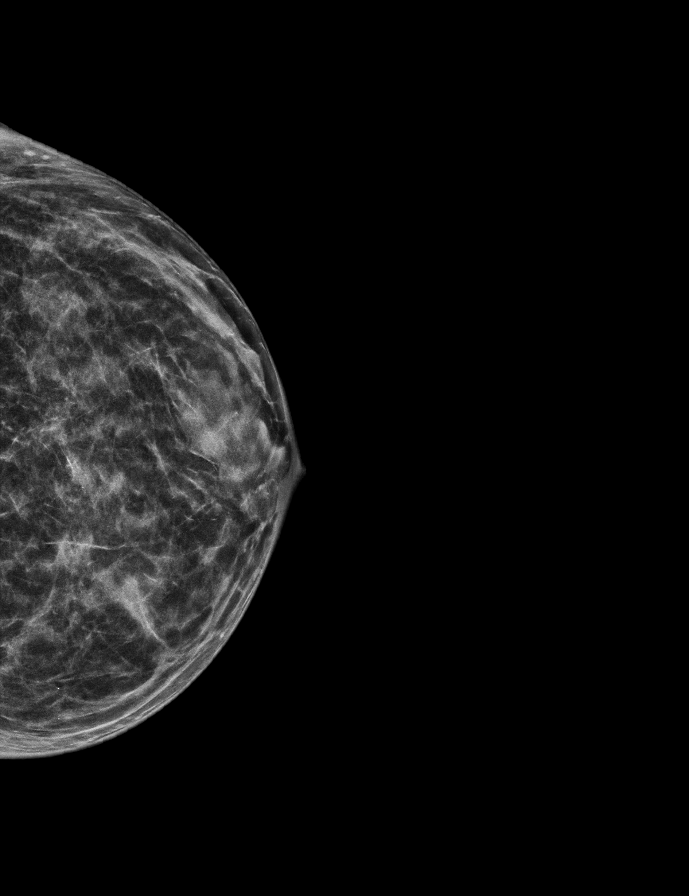

[R MLO tomo (1 of 2) · tomo slice 23/46.0]
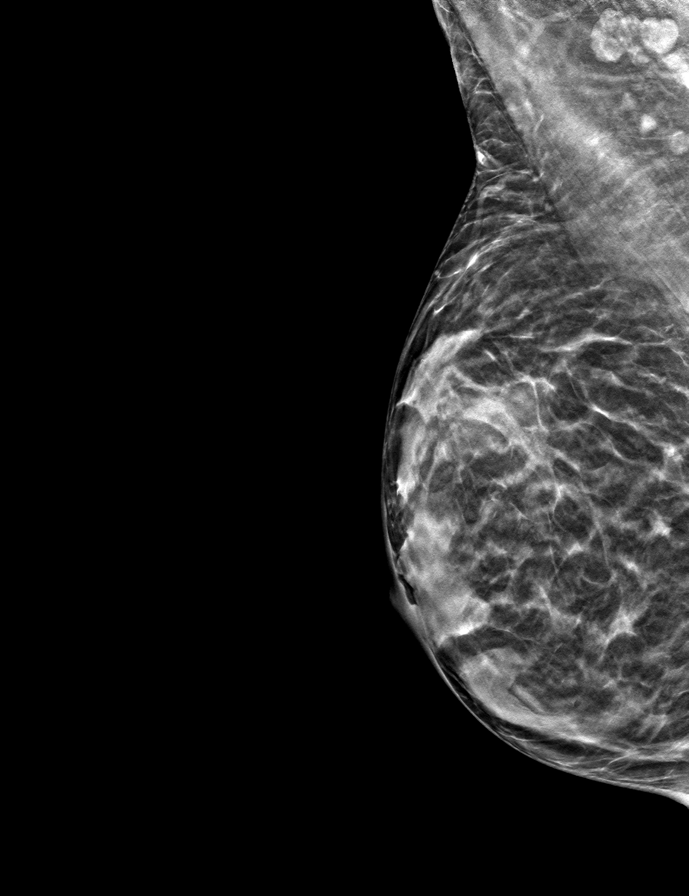

[R CC tomo · tomo slice 24/47.0]
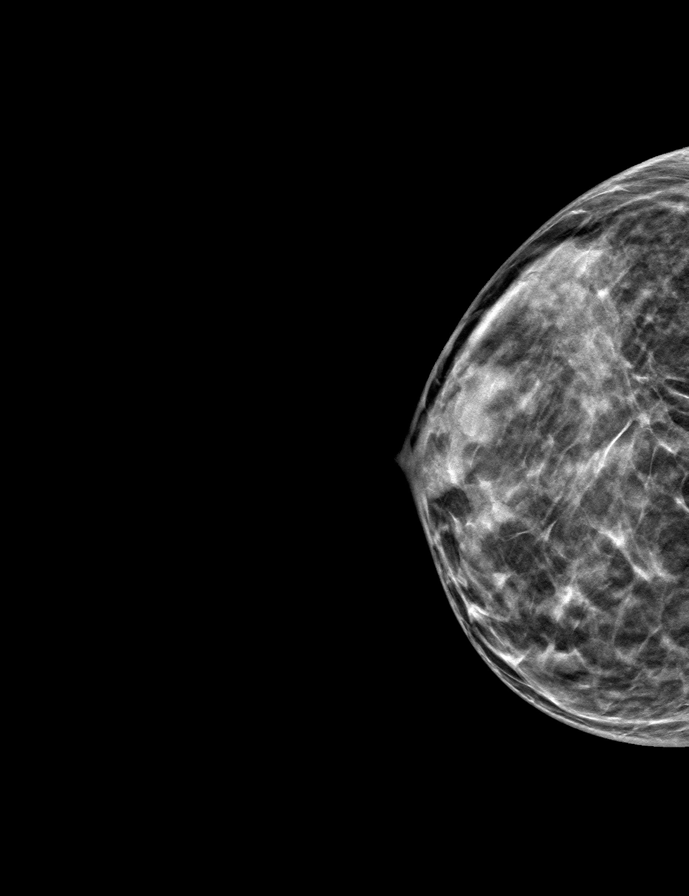

[L CC tomo · tomo slice 24/47.0]
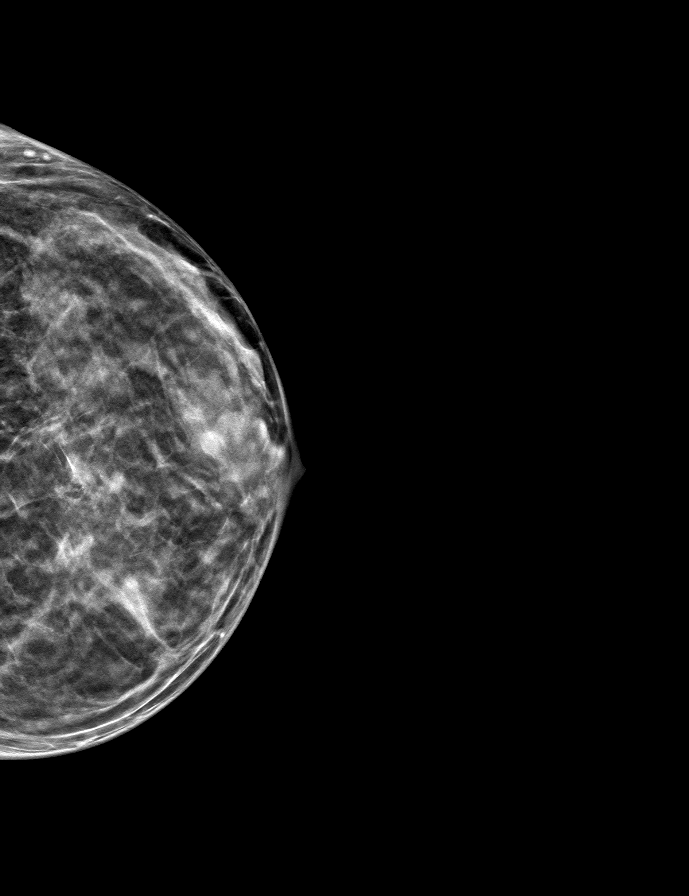

[R MLO tomo (2 of 2) · tomo slice 23/46.0]
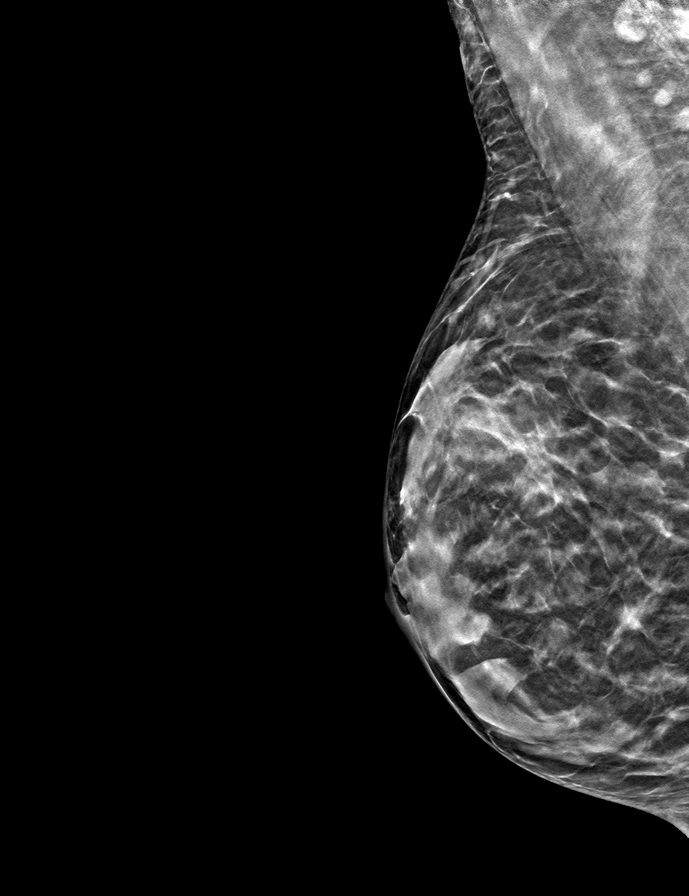

[9 of 25 positions shown; findings below may reference images not displayed]

ACR Breast Density Category c: The breast tissue is heterogeneously
dense, which may obscure small masses.
FINDINGS: There are no findings suspicious for malignancy. Images were
processed with CAD.
IMPRESSION: No mammographic evidence of malignancy. A result letter of this
screening mammogram will be mailed directly to the patient.

RECOMMENDATION:
Screening mammogram in one year. (Code:[id number])

BI-RADS CATEGORY  1: Negative.

## 2020-07-22 ENCOUNTER — Other Ambulatory Visit: Payer: Self-pay | Admitting: Endocrinology

## 2020-07-23 ENCOUNTER — Other Ambulatory Visit: Payer: Self-pay

## 2020-07-27 ENCOUNTER — Other Ambulatory Visit: Payer: Self-pay

## 2020-07-27 ENCOUNTER — Encounter: Payer: Self-pay | Admitting: Endocrinology

## 2020-07-27 ENCOUNTER — Ambulatory Visit: Payer: Medicare Other | Admitting: Endocrinology

## 2020-07-27 VITALS — BP 124/62 | HR 75 | Ht 64.0 in | Wt 106.4 lb

## 2020-07-27 DIAGNOSIS — E1065 Type 1 diabetes mellitus with hyperglycemia: Secondary | ICD-10-CM

## 2020-07-27 DIAGNOSIS — E7800 Pure hypercholesterolemia, unspecified: Secondary | ICD-10-CM | POA: Diagnosis not present

## 2020-07-27 MED ORDER — LEVEMIR FLEXTOUCH 100 UNIT/ML ~~LOC~~ SOPN: PEN_INJECTOR | SUBCUTANEOUS | 11 refills | Status: DC

## 2020-07-27 NOTE — Patient Instructions (Addendum)
Stop taking NovoLog at dinnertime  LEVEMIR INSULIN INSTEAD TRESIBA  LEVEMIR 3 UNITS IN AM AND 6 AT BEDTIME  IF am sugar stays over 180 then go to 7 units

## 2020-07-27 NOTE — Progress Notes (Signed)
Patient ID: Leslie Lane, female   DOB: Jul 03, 1956, 65 y.o.   MRN: [id number]  Reason for Appointment: Endocrinology follow-up   History of Present Illness    Diagnosis: Type 1 DIABETES MELITUS, diagnosed 1967      She has had labile blood sugar control over the last several years even though A1c has been usually around 7% She has had less lability and hypoglycemia with taking b.i.d. Lantus compared once a day She has been very sensitive to fast acting insulin and frequently does not require mealtime coverage She cannot tolerate [place] because of reported episode of headache, bloating and nausea with the first dose  RECENT history:  Insulin regimen: Tresiba 8 units daily, Novolog 1-2 units up to 3 times a day   Her A1c is most recently 6.1  Current blood sugar patterns and problems identified:  She has been using the freestyle libre but this has variable accuracy  Appears to be getting more hypoglycemia recorded during the day on her fingersticks compared to her Elenor [place] including when she had a glucose of 38 on the Accu-Chek  As before her blood sugars for some reason will increase through the night progressively even though she is not eating snacks after about 10 PM; some of the snacks may be high fat like chips  She was told to stop her NOVOLOG at dinnertime but she still takes about 2 units  She appears to have consistently low sugars sometimes after dinner in the evening when she is also doing more housework  No recent symptomatic severe hypoglycemia  Hypoglycemia: Symptoms may be absent with low blood sugars and does not have early warning symptoms. Gets confused and she frequently depends on her mother to recognize low sugars and treat them.  Her mother knows how to give Glucagon injection  She will treat her low blood sugars usually with juice, does carry glucose tablets when not at home        Data from freestyle libre   Glycemic patterns summary:  Blood sugars are rising progressively after about 10 PM until about 9 AM.  Subsequently they are gradually declining into frequently hypoglycemia range around 9-10 PM  HYPERGLYCEMIA is mostly overnight as above and occasional mild increase after lunch and having blood sugar highest at 197 between 8-10 AM  Have hypoglycemia is occurring practically every day in the evenings between 8-11 PM for variable length of time  Overnight blood sugars are not generally rising progressively from bedtime until 9 AM and only occasionally will stay overnight  Preprandial readings are generally high at breakfast, mildly increased at lunch and near normal at dinnertime  CGM use % of time  96  2-week average/GV  139/37  Time in range       70% was 69  % Time Above 180  22  % Time above 250   % Time Below 70 8    Blood sugars from Accu-Chek  PRE-MEAL Fasting Lunch Dinner Bedtime Overall  Glucose range:       Averages:  198  88  70   121   POST-MEAL PC Breakfast PC Lunch PC Dinner  Glucose range:     Averages:   95  89    Previous data:   CGM use % of time  91  2-week average/SD  132, GV 39  Time in range      69%  % Time Above 180  20  % Time above 250   % Time  Below 70  11     PRE-MEAL  6-8 AM Lunch Dinner Bedtime Overall  Glucose range:  44-216    100-271   Averages:  174  124  117  83    POST-MEAL PC Breakfast PC Lunch PC Dinner  Glucose range:     Averages:  128  140  94           Wt Readings from Last 3 Encounters:  07/27/20 106 lb 6.4 oz (48.3 kg)  06/10/20 109 lb (49.4 kg)  04/20/20 105 lb 9.6 oz (47.9 kg)    Lab Results  Component Value Date   HGBA1C 6.1 06/10/2020   HGBA1C 6.4 02/17/2020   HGBA1C 7.1 (H) 12/16/2019   Lab Results  Component Value Date   MICROALBUR 1.0 06/10/2020   LDLCALC 66 06/10/2020   CREATININE 0.77 06/10/2020        Allergies as of 07/27/2020      Reactions   Penicillins Anaphylaxis   Sulfonamide Derivatives Anaphylaxis       Medication List       Accurate as of July 27, 2020 11:59 PM. If you have any questions, ask your nurse or doctor.        STOP taking these medications   Tresiba FlexTouch 100 UNIT/ML FlexTouch Pen Generic drug: insulin degludec Stopped by: [name], MD     TAKE these medications   Accu-Chek Aviva Plus test strip Generic drug: glucose blood TEST BLOOD SUGAR FOUR TIMES DAILY   Accu-Chek Aviva Plus w/Device Kit USE AS DIRECTED   Accu-Chek Aviva Soln USE AS DIRECTED   Accu-Chek Softclix Lancets lancets Use as instructed to check blood sugar 4 times per day dx code E10.65   aspirin EC 81 MG tablet Take 81 mg by mouth every morning.   B-D SINGLE USE SWABS REGULAR Pads USE FOUR TIMES DAILY   diclofenac Sodium 1 % Gel Commonly known as: VOLTAREN APPLY 2 GRAMS EXTERNALLY TO THE AFFECTED AREA FOUR TIMES DAILY   donepezil 10 MG tablet Commonly known as: ARICEPT TAKE 1/2 TABLET BY MOUTH DAILY FOR 2 WEEKS. INCREASE TO 1 TABLET DAILY AND. CONTINUE   Fish Oil 1000 MG Caps Take 1 capsule by mouth daily.   gabapentin 100 MG capsule Commonly known as: NEURONTIN TAKE 1 TO 2 CAPSULES BY MOUTH TWICE DAILY   Gvoke HypoPen 2-Pack 1 MG/0.2ML Soaj Generic drug: Glucagon Inject 1 mg into the skin as needed. For Hypo   insulin lispro 100 UNIT/ML KwikPen Commonly known as: HumaLOG KwikPen inject 10 UNITS UNDER THE SKIN EVERY MORNING AND inject EIGHT UNITS EVERY EVENING PER sliding scale. inject 1-3 UNITS AS NEEDED FOR high blood sugars What changed: how much to take   lamoTRIgine 100 MG tablet Commonly known as: LAMICTAL Take 1 tablet (100 mg total) by mouth 2 (two) times daily.   Levemir FlexTouch 100 UNIT/ML FlexPen Generic drug: insulin detemir 3 units in am and 6 at bedtime Started by: [name], MD   lidocaine 5 % Commonly known as: LIDODERM UNWRAP AND APPLY 1 PATCH TO SKIN EVERY DAY(REMOVE AND DISCARD PATCH WITHIN 12 HOURS OR AS DIRECTED BY DOCTOR)    lipase/protease/amylase 36000 UNITS Cpep capsule Commonly known as: Creon Take 2 capsules (72,000 Units total) by mouth 3 (three) times daily before meals.   loratadine 10 MG tablet Commonly known as: CLARITIN Take 10 mg by mouth as needed for allergies.   lovastatin 40 MG tablet Commonly known as: MEVACOR Take 1 tablet (40 mg  total) by mouth at bedtime.   Magnesium 500 MG Tabs Take 1 tablet by mouth as needed.   metoCLOPramide 5 MG tablet Commonly known as: REGLAN TAKE 1 TABLET THREE TIMES DAILY BEFORE MEALS AS NEEDED   multivitamin tablet Take 1 tablet by mouth daily. Reported on 11/22/2015   omeprazole 40 MG capsule Commonly known as: PRILOSEC TAKE ONE CAPSULE BY MOUTH ONCE DAILY   sertraline 50 MG tablet Commonly known as: Zoloft Take 1 tablet (50 mg total) by mouth daily.   traZODone 100 MG tablet Commonly known as: DESYREL Take 4 tablets (400 mg total) by mouth at bedtime.   TRUEplus Pen Needles 31G X 8 MM Misc Generic drug: Insulin Pen Needle Use as instructed to inject insulin 4 times daily as directed.   vitamin B-12 1000 MCG tablet Commonly known as: CYANOCOBALAMIN Take 1 tablet by mouth daily.   vitamin E 1000 UNIT capsule Take 1,000 Units by mouth daily.       Allergies:  Allergies  Allergen Reactions   Penicillins Anaphylaxis   Sulfonamide Derivatives Anaphylaxis    Past Medical History:  Diagnosis Date   ALLERGIC RHINITIS    Allergy    Anemia    Anxiety    Arthritis    BIPOLAR AFFECTIVE DISORDER    Cataract    DEPRESSION    Diabetes mellitus type I ([place])    DIABETES MELLITUS, TYPE II    follows with endo   Fibromyalgia    GERD    HEPATITIS B    HYPERLIPIDEMIA    LOW BACK PAIN, CHRONIC    MIGRAINE HEADACHE    NARCOTIC ABUSE    hx of   NECK PAIN, CHRONIC    Neuropathy    Seizures ([place])    pt states, "if my blood sugar drops to the 20's, I convulse.  It hasn't happened in a long time."   SMOKER      Past Surgical History:  Procedure Laterality Date   CESAREAN SECTION     COLONOSCOPY     TUBAL LIGATION     UPPER GASTROINTESTINAL ENDOSCOPY      Family History  Problem Relation Age of Onset   Arthritis Mother    Colon cancer Mother        ? age of dx   Arthritis Father    Kidney disease Father    Kidney cancer Father    Heart attack Brother    Bladder Cancer Sister    Hyperlipidemia Other    Kidney cancer Paternal Aunt    Anxiety disorder Neg Hx    Bipolar disorder Neg Hx    Depression Neg Hx    Breast cancer Neg Hx    Esophageal cancer Neg Hx    Stomach cancer Neg Hx    Rectal cancer Neg Hx     Social History:  reports that she has been smoking cigarettes. She has a 80.00 pack-year smoking history. She has never used smokeless tobacco. She reports current alcohol use. She reports that she does not use drugs.  Review of Systems:   Had gastroparesis treated with Reglan tid-qid, usually has no nausea with taking Reglan  NEUROPATHY: She has burning in her legs and feet at times treated with gabapentin 100 mg, she also thinks this helps the pains in her arms at night   DEPRESSION: She has had long-standing depression and anxiety on long-term treatment   HYPERLIPIDEMIA: The lipid abnormality consists of elevated LDL   Is  on lovastatin 40  mg with  good control   Lab Results  Component Value Date   CHOL 156 06/10/2020   HDL 76.70 06/10/2020   LDLCALC 66 06/10/2020   LDLDIRECT 101.7 03/26/2012   TRIG 68.0 06/10/2020   CHOLHDL 2 06/10/2020    Memory difficulties: She was given Aricept by the neurologist, is taking 10 mg but ran out of the prescription refills and still has not refilled it   Examination:   BP 124/62    Pulse 75    Ht _0  (1.626 m)    Wt 106 lb 6.4 oz (48.3 kg)    SpO2 96%    BMI 18.26 kg/m   Body mass index is 18.26 kg/m.    ASSESSMENT/ PLAN:   Diabetes type 1 with poor control  See history of present illness  for detailed discussion of current insulin, blood sugar patterns, problems identified  A1c has been under 7% and now 6.1  She is using both the freestyle libre and the Accu-Chek and still monitoring about 5 times a day with the Accu-Chek despite having the libre Blood sugar may not always correlate and appeared to be lower at times with the Accu-Chek meter  Difficult to control her sugars with significant hyperglycemia overnight and hypoglycemia late evenings usually after dinner She continues to dose NovoLog at dinnertime despite advising her not to do so to avoid hypoglycemia Also her basal insulin does not appear to be controlling her sugars overnight but may get low normal readings during the day at lunch and dinner   Recommendations:  She will switch from [place] to Levemir 3 units in the morning and 6 in the evening  She was advised to increase the evening dose 1-2 units of morning sugars stay over 180 but also reduce his morning sugars are lower  To increase her morning dose if needed  She will be stopping NovoLog at dinnertime and only take NovoLog 1 to 2 units for large meals at breakfast or lunch  She will not take more than 1 unit of NovoLog extra for correction of high readings unless blood sugar is over 200  Consider setting of the Dexcom if she is able to get the supplies, to discuss in the next visit  Review blood sugar patterns again in 2 months  LIPIDS: Needs follow-up in the next visit  For her symptoms suggesting having over from the trazodone she will try to take her 200 mg dose at least 1 to 2 hours before going to bedtime, may be able to reduce the dose also  Patient Instructions  Stop taking NovoLog at dinnertime  LEVEMIR INSULIN INSTEAD TRESIBA  LEVEMIR 3 UNITS IN AM AND 6 AT BEDTIME  IF am sugar stays over 180 then go to 7 units       [name] 07/28/2020, 8:05 AM

## 2020-07-30 DIAGNOSIS — E1043 Type 1 diabetes mellitus with diabetic autonomic (poly)neuropathy: Secondary | ICD-10-CM | POA: Diagnosis not present

## 2020-07-30 DIAGNOSIS — Z794 Long term (current) use of insulin: Secondary | ICD-10-CM | POA: Diagnosis not present

## 2020-08-04 ENCOUNTER — Other Ambulatory Visit: Payer: Self-pay | Admitting: Endocrinology

## 2020-08-10 ENCOUNTER — Telehealth: Payer: Self-pay | Admitting: Pharmacist

## 2020-08-10 NOTE — Progress Notes (Addendum)
Chronic Care Management Pharmacy Assistant   Name: Leslie Lane  MRN: [id number] DOB: June 22, 1956  Reason for Encounter: Medication Review  Patient Questions:  1.  Have you seen any other providers since your last visit? Yes, patient saw Dr. Dwyane Dee on 07/27/20  2.  Any changes in your medicines or health? Yes, Dr. Dwyane Dee switched Tyler Aas to Levemir - 3 units AM and 6 units PM    PCP : [name], MD  Allergies:   Allergies  Allergen Reactions   Penicillins Anaphylaxis   Sulfonamide Derivatives Anaphylaxis    Medications: Outpatient Encounter Medications as of 08/10/2020  Medication Sig   sertraline (ZOLOFT) 50 MG tablet Take 1 tablet (50 mg total) by mouth daily. (Patient not taking: Reported on 07/27/2020)   Accu-Chek Softclix Lancets lancets Use as instructed to check blood sugar 4 times per day dx code E10.65   Alcohol Swabs (B-D SINGLE USE SWABS REGULAR) PADS USE FOUR TIMES DAILY   aspirin EC 81 MG tablet Take 81 mg by mouth every morning.   Blood Glucose Calibration (ACCU-CHEK AVIVA) SOLN USE AS DIRECTED   Blood Glucose Monitoring Suppl (ACCU-CHEK AVIVA PLUS) w/Device KIT USE AS DIRECTED   diclofenac Sodium (VOLTAREN) 1 % GEL APPLY 2 GRAMS EXTERNALLY TO THE AFFECTED AREA FOUR TIMES DAILY   donepezil (ARICEPT) 10 MG tablet TAKE 1/2 TABLET BY MOUTH DAILY FOR 2 WEEKS. INCREASE TO 1 TABLET DAILY AND. CONTINUE (Patient not taking: Reported on 07/27/2020)   gabapentin (NEURONTIN) 100 MG capsule TAKE 1 TO 2 CAPSULES BY MOUTH TWICE DAILY   glucose blood (ACCU-CHEK AVIVA PLUS) test strip TEST BLOOD SUGAR FOUR TIMES DAILY   GVOKE HYPOPEN 2-PACK 1 MG/0.2ML SOAJ Inject 1 mg into the skin as needed. For Hypo   insulin detemir (LEVEMIR FLEXTOUCH) 100 UNIT/ML FlexPen 3 units in am and 6 at bedtime   insulin lispro (HUMALOG KWIKPEN) 100 UNIT/ML KwikPen inject 10 UNITS UNDER THE SKIN EVERY MORNING AND inject EIGHT UNITS EVERY EVENING PER sliding scale. inject 1-3 UNITS AS NEEDED FOR  high blood sugars (Patient taking differently: 2-4 Units. inject 10 UNITS UNDER THE SKIN EVERY MORNING AND inject EIGHT UNITS EVERY EVENING PER sliding scale. inject 1-3 UNITS AS NEEDED FOR high blood sugars)   lamoTRIgine (LAMICTAL) 100 MG tablet Take 1 tablet (100 mg total) by mouth 2 (two) times daily. (Patient not taking: Reported on 07/27/2020)   lidocaine (LIDODERM) 5 % UNWRAP AND APPLY 1 PATCH TO SKIN EVERY DAY(REMOVE AND DISCARD PATCH WITHIN 12 HOURS OR AS DIRECTED BY DOCTOR)   lipase/protease/amylase (CREON) 36000 UNITS CPEP capsule Take 2 capsules (72,000 Units total) by mouth 3 (three) times daily before meals.   loratadine (CLARITIN) 10 MG tablet Take 10 mg by mouth as needed for allergies.   lovastatin (MEVACOR) 40 MG tablet Take 1 tablet (40 mg total) by mouth at bedtime.   Magnesium 500 MG TABS Take 1 tablet by mouth as needed.   metoCLOPramide (REGLAN) 5 MG tablet TAKE 1 TABLET THREE TIMES DAILY BEFORE MEALS AS NEEDED   Multiple Vitamin (MULTIVITAMIN) tablet Take 1 tablet by mouth daily. Reported on 11/22/2015   Omega-3 Fatty Acids (FISH OIL) 1000 MG CAPS Take 1 capsule by mouth daily.   omeprazole (PRILOSEC) 40 MG capsule TAKE ONE CAPSULE BY MOUTH ONCE DAILY   traZODone (DESYREL) 100 MG tablet Take 4 tablets (400 mg total) by mouth at bedtime.   TRUEPLUS PEN NEEDLES 31G X 8 MM MISC Use as instructed to inject  insulin 4 times daily as directed.   vitamin B-12 (CYANOCOBALAMIN) 1000 MCG tablet Take 1 tablet by mouth daily.   vitamin E 1000 UNIT capsule Take 1,000 Units by mouth daily.   No facility-administered encounter medications on file as of 08/10/2020.    Current Diagnosis: Patient Active Problem List   Diagnosis Date Noted   Acquired trigger finger of left ring finger 02/18/2019   Right shoulder pain 02/18/2018   Protein calorie malnutrition ([place]) 06/01/2017   Routine general medical examination at a health care facility 06/01/2017   WBC decreased 03/27/2017   Diabetic  gastroparesis associated with type 1 diabetes mellitus ([place]) 10/24/2016   Abdominal pain 02/28/2016   Insomnia 04/13/2015   Major depressive disorder, recurrent, severe without psychotic features ([place]) 07/28/2014   Generalized anxiety disorder 04/27/2014   Abnormal CT scan, bladder    HEPATITIS B 07/05/2010   Mixed diabetic hyperlipidemia associated with type 1 diabetes mellitus ([place]) 07/05/2010   BIPOLAR AFFECTIVE DISORDER 07/05/2010   Smokers' cough ([place]) 07/05/2010   MIGRAINE HEADACHE 07/05/2010   GERD 07/05/2010   LOW BACK PAIN, CHRONIC 07/05/2010    Goals Addressed   None     Follow-Up:  Coordination of Enhanced Pharmacy Services   Reviewed chart for medication changes ahead of medication coordination call.  No OVs, Consults, or hospital visits since last care coordination call/Pharmacist visit.  No medication changes indicated   BP Readings from Last 3 Encounters:  07/27/20 124/62  06/10/20 122/68  04/20/20 126/72    Lab Results  Component Value Date   HGBA1C 6.1 06/10/2020     Patient obtains medications through Vials  30 Days   Last adherence delivery included:  Gvoke Hypopen 2-pack inject 1 mg Insulin Lispro 100 units inject 10 units under the skin at breakfast, 8 units at dinner, and 1-3 units as needed for high blood sugar Accu chek test strips test blood sugar four times daily Bd Ultra fine short pen needles use as instructed to inject insulin 4 times daily as needed Lovastatin 40 mg 1 tab by mouth at bedtime Patient is due for next adherence delivery on: 08/13/20. Called patient and reviewed medications and coordinated delivery.  This delivery to include: Accu chek test strips test blood sugar four times daily Lovastatin 40 mg 1 tab by mouth at bedtime Gabapentin 100 mg take 1 to 2 caps by mouth twice daily breakfast and bedtime     Patient declined the following medications omeprazole, lamotrigine, and trazodone she has plenty on hand  Confirmed  delivery date of 08/13/20, advised patient that pharmacy will contact them the morning of delivery.   Wendy Poet, [place] [phone]

## 2020-08-11 ENCOUNTER — Other Ambulatory Visit: Payer: Self-pay | Admitting: Internal Medicine

## 2020-08-11 DIAGNOSIS — Z1231 Encounter for screening mammogram for malignant neoplasm of breast: Secondary | ICD-10-CM

## 2020-08-16 ENCOUNTER — Telehealth: Payer: Self-pay | Admitting: Pharmacist

## 2020-08-16 NOTE — Progress Notes (Signed)
error 

## 2020-08-19 ENCOUNTER — Telehealth (HOSPITAL_COMMUNITY): Payer: Self-pay | Admitting: Psychiatry

## 2020-08-19 ENCOUNTER — Telehealth (HOSPITAL_COMMUNITY): Payer: Medicare Other | Admitting: Psychiatry

## 2020-08-19 ENCOUNTER — Other Ambulatory Visit: Payer: Self-pay

## 2020-08-19 NOTE — Telephone Encounter (Deleted)
I called the patient at our scheduled appointment time. There was no answer. I left a voice message for patient to call the clinic back at their convinence.   

## 2020-08-19 NOTE — Telephone Encounter (Signed)
I called the patient at our scheduled appointment time. There was no answer. I left a voice message for patient to call the clinic back at their convinence.   

## 2020-08-30 DIAGNOSIS — Z794 Long term (current) use of insulin: Secondary | ICD-10-CM | POA: Diagnosis not present

## 2020-08-30 DIAGNOSIS — E1043 Type 1 diabetes mellitus with diabetic autonomic (poly)neuropathy: Secondary | ICD-10-CM | POA: Diagnosis not present

## 2020-09-01 NOTE — Progress Notes (Signed)
   I, Wendy Poet, LAT, ATC, am serving as scribe for Dr. Lynne Leader.  Leslie Lane is a 65 y.o. female who presents to [place] [facility] today for L 4th finger pain. Pt was last seen by Dr. Georgina Snell on 04/16/20 for R hand pain and sprain of interphalangeal joint of 4th finger and was advised to use Voltaren gel and was referred for hand PT to [facility]. Today, pt locates pain to her L 4th MCP joint which is the opposite side that we treated her for previously.  She states this has been bothering her for approximately 2 months and notes locking in the finger.  She has been taking Tylenol.  Dx imaging: 04/16/20 4th finger XR  Pertinent review of systems: No fevers or chills  Relevant historical information: Diabetes   Exam:  BP 128/62 (BP Location: Left Arm, Patient Position: Sitting, Cuff Size: Normal)   Pulse 79   Ht 5\' 4"  (1.626 m)   Wt 104 lb (47.2 kg)   SpO2 94%   BMI 17.85 kg/m  General: Well Developed, well nourished, and in no acute distress.   MSK: Left hand normal-appearing Triggering present with flexion of fourth PIP.  Tender palpation palmar MCP with no palpable nodule.    Lab and Radiology Results  Procedure: Real-time Ultrasound Guided Injection of left fourth MCP A1 pulley tendon sheath (trigger finger injection) Device: Philips Affiniti 50G Images permanently stored and available for review in PACS Verbal informed consent obtained.  Discussed risks and benefits of procedure. Warned about infection bleeding damage to structures skin hypopigmentation and fat atrophy among others. Patient expresses understanding and agreement Time-out conducted.   Noted no overlying erythema, induration, or other signs of local infection.   Skin prepped in a sterile fashion.   Local anesthesia: Topical Ethyl chloride.   With sterile technique and under real time ultrasound guidance:  40 mg of Depo-Medrol and 0.5 mL of lidocaine (0.5 mL of 80 mg/mL  Depo-Medrol solution) injected into tendon sheath. Fluid seen entering the tendon sheath.   Completed without difficulty   Pain immediately resolved suggesting accurate placement of the medication.   Advised to call if fevers/chills, erythema, induration, drainage, or persistent bleeding.   Images permanently stored and available for review in the ultrasound unit.  Impression: Technically successful ultrasound guided injection.         Assessment and Plan: 65 y.o. female with left trigger finger not improving initially.  Leslie Lane does not remember this but she had the same issue in two thousand twenty that was successfully treated with injection.  Plan for injection again today double Band-Aid splint.  If not improved consider surgery.  PDMP not reviewed this encounter. Orders Placed This Encounter  Procedures  . [place] LIMITED JOINT SPACE STRUCTURES UP LEFT(NO LINKED CHARGES)    Order Specific Question:   Reason for Exam (SYMPTOM  OR DIAGNOSIS REQUIRED)    Answer:   L ring finger    Order Specific Question:   Preferred imaging location?    Answer:   [place]   No orders of the defined types were placed in this encounter.    Discussed warning signs or symptoms. Please see discharge instructions. Patient expresses understanding.   The above documentation has been reviewed and is accurate and complete Lynne Leader, M.D.

## 2020-09-02 ENCOUNTER — Telehealth (INDEPENDENT_AMBULATORY_CARE_PROVIDER_SITE_OTHER): Payer: Medicare Other | Admitting: Psychiatry

## 2020-09-02 ENCOUNTER — Ambulatory Visit: Payer: Medicare Other | Admitting: Family Medicine

## 2020-09-02 ENCOUNTER — Other Ambulatory Visit: Payer: Self-pay

## 2020-09-02 ENCOUNTER — Ambulatory Visit: Payer: Self-pay

## 2020-09-02 ENCOUNTER — Telehealth: Payer: Self-pay | Admitting: Endocrinology

## 2020-09-02 ENCOUNTER — Encounter: Payer: Self-pay | Admitting: Family Medicine

## 2020-09-02 VITALS — BP 128/62 | HR 79 | Ht 64.0 in | Wt 104.0 lb

## 2020-09-02 DIAGNOSIS — G4701 Insomnia due to medical condition: Secondary | ICD-10-CM | POA: Diagnosis not present

## 2020-09-02 DIAGNOSIS — M65342 Trigger finger, left ring finger: Secondary | ICD-10-CM | POA: Diagnosis not present

## 2020-09-02 DIAGNOSIS — F4010 Social phobia, unspecified: Secondary | ICD-10-CM | POA: Diagnosis not present

## 2020-09-02 DIAGNOSIS — F332 Major depressive disorder, recurrent severe without psychotic features: Secondary | ICD-10-CM | POA: Diagnosis not present

## 2020-09-02 MED ORDER — LAMOTRIGINE 100 MG PO TABS: 100.0000 mg | ORAL_TABLET | Freq: Two times a day (BID) | ORAL | 0 refills | Status: DC

## 2020-09-02 MED ORDER — TRAZODONE HCL 100 MG PO TABS: 400.0000 mg | ORAL_TABLET | Freq: Every day | ORAL | 0 refills | Status: DC

## 2020-09-02 NOTE — Telephone Encounter (Signed)
MEDICATION: levemir  PHARMACY:   [facility] - [place], [place] - [address] Phone:  [phone]  Fax:  [phone]      HAS THE PATIENT CONTACTED [place]?  no  IS THIS A 90 DAY SUPPLY : yes  IS PATIENT OUT OF MEDICATION: yes  IF NOT; HOW MUCH IS LEFT:   LAST APPOINTMENT DATE: @2 /08/2020  NEXT APPOINTMENT DATE:@3 /17/2022  DO WE HAVE YOUR PERMISSION TO LEAVE A DETAILED MESSAGE?:  OTHER COMMENTS:    **Let patient know to contact pharmacy at the end of the day to make sure medication is ready. **  ** Please notify patient to allow 48-72 hours to process**  **Encourage patient to contact the pharmacy for refills or they can request refills through [facility]**

## 2020-09-02 NOTE — Progress Notes (Signed)
Virtual Visit via Telephone Note  I connected with Leslie Lane on 09/02/20 at 11:00 AM EST by telephone and verified that I am speaking with the correct person using two identifiers.  Location: Patient: home Provider: office   I discussed the limitations, risks, security and privacy concerns of performing an evaluation and management service by telephone and the availability of in person appointments. I also discussed with the patient that there may be a patient responsible charge related to this service. The patient expressed understanding and agreed to proceed.   History of Present Illness: Leslie Lane shares that nothing has really changed. Her depression is ongoing. She had to stop Zoloft due to severe dry mouth. Leslie Lane feels that her depression is directly related to her blood sugar levels. She remains fatigued and her sleep is poor but is a little better with Trazodone. Her anxiety is ongoing but she is dealing with it. She denies SI/HI. Leslie Lane does not want her meds changed and does not want to try any other meds today.    Observations/Objective:  General Appearance: unable to assess  Eye Contact:  unable to assess  Speech:  Clear and Coherent and Normal Rate  Volume:  Normal  Mood:  Depressed  Affect:  Congruent  Thought Process:  Coherent and Descriptions of Associations: Circumstantial  Orientation:  Full (Time, Place, and Person)  Thought Content:  Rumination  Suicidal Thoughts:  No  Homicidal Thoughts:  No  Memory:  Immediate;   Good  Judgement:  Fair  Insight:  Fair  Psychomotor Activity: unable to assess  Concentration:  Concentration: Good  Recall:  Good  Fund of Knowledge:  Good  Language:  Good  Akathisia:  unable to assess  Handed:  Right  AIMS (if indicated):     Assets:  Communication Skills Desire for Improvement Financial Resources/Insurance Housing Resilience Social Support Talents/Skills Transportation Vocational/Educational  ADL's:  unable to assess   Cognition:  WNL  Sleep:         Assessment and Plan: 1. Major depressive disorder, recurrent, severe without psychotic features ([place]) - lamoTRIgine (LAMICTAL) 100 MG tablet; Take 1 tablet (100 mg total) by mouth 2 (two) times daily.  Dispense: 180 tablet; Refill: 0 - traZODone (DESYREL) 100 MG tablet; Take 4 tablets (400 mg total) by mouth at bedtime.  Dispense: 360 tablet; Refill: 0  2. Social anxiety disorder - lamoTRIgine (LAMICTAL) 100 MG tablet; Take 1 tablet (100 mg total) by mouth 2 (two) times daily.  Dispense: 180 tablet; Refill: 0  3. Insomnia due to medical condition - traZODone (DESYREL) 100 MG tablet; Take 4 tablets (400 mg total) by mouth at bedtime.  Dispense: 360 tablet; Refill: 0  PHQ9 = 18 - mostly related to physical symptoms due to medical problems  C-SSRS- short= no risk   Follow Up Instructions: In 3 months or sooner if needed   I discussed the assessment and treatment plan with the patient. The patient was provided an opportunity to ask questions and all were answered. The patient agreed with the plan and demonstrated an understanding of the instructions.   The patient was advised to call back or seek an in-person evaluation if the symptoms worsen or if the condition fails to improve as anticipated.  I provided 26 minutes of non-face-to-face time during this encounter.   Charlcie Cradle, MD

## 2020-09-02 NOTE — Patient Instructions (Signed)
Thank you for coming in today.  Call or go to the ER if you develop a large red swollen joint with extreme pain or oozing puss.   Use the double bandaid splint for a few weeks.    Trigger Finger  Trigger finger, also called stenosing tenosynovitis,  is a condition that causes a finger to get stuck in a bent position. Each finger has a tendon, which is a tough, cord-like tissue that connects muscle to bone, and each tendon passes through a tunnel of tissue called a tendon sheath. To move your finger, your tendon needs to glide freely through the sheath. Trigger finger happens when the tendon or the sheath thickens, making it difficult to move your finger. Trigger finger can affect any finger or a thumb. It may affect more than one finger. Mild cases may clear up with rest and medicine. Severe cases require more treatment. What are the causes? Trigger finger is caused by a thickened finger tendon or tendon sheath. The cause of this thickening is not known. What increases the risk? The following factors may make you more likely to develop this condition:  Doing activities that require a strong grip.  Having rheumatoid arthritis, gout, or diabetes.  Being 57-24 years old.  Being female. What are the signs or symptoms? Symptoms of this condition include:  Pain when bending or straightening your finger.  Tenderness or swelling where your finger attaches to the palm of your hand.  A lump in the palm of your hand or on the inside of your finger.  Hearing a noise like a pop or a snap when you try to straighten your finger.  Feeling a catching or locking sensation when you try to straighten your finger.  Being unable to straighten your finger. How is this diagnosed? This condition is diagnosed based on your symptoms and a physical exam. How is this treated? This condition may be treated by:  Resting your finger and avoiding activities that make symptoms worse.  Wearing a finger  splint to keep your finger extended.  Taking NSAIDs, such as ibuprofen, to relieve pain and swelling.  Doing gentle exercises to stretch the finger as told by your health care provider.  Having medicine that reduces swelling and inflammation (steroids) injected into the tendon sheath. Injections may need to be repeated.  Having surgery to open the tendon sheath. This may be done if other treatments do not work and you cannot straighten your finger. You may need physical therapy after surgery. Follow these instructions at home: If you have a splint:  Wear the splint as told by your health care provider. Remove it only as told by your health care provider.  Loosen it if your fingers tingle, become numb, or turn cold and blue.  Keep it clean.  If the splint is not waterproof: ? Do not let it get wet. ? Cover it with a watertight covering when you take a bath or shower. Managing pain, stiffness, and swelling If directed, apply heat to the affected area as often as told by your health care provider. Use the heat source that your health care provider recommends, such as a moist heat pack or a heating pad.  Place a towel between your skin and the heat source.  Leave the heat on for 20-30 minutes.  Remove the heat if your skin turns bright red. This is especially important if you are unable to feel pain, heat, or cold. You may have a greater risk of getting burned.  If directed, put ice on the painful area. To do this:  If you have a removable splint, remove it as told by your health care provider.  Put ice in a plastic bag.  Place a towel between your skin and the bag or between your splint and the bag.  Leave the ice on for 20 minutes, 2-3 times a day.      Activity  Rest your finger as told by your health care provider. Avoid activities that make the pain worse.  Return to your normal activities as told by your health care provider. Ask your health care provider what activities  are safe for you.  Do exercises as told by your health care provider.  Ask your health care provider when it is safe to drive if you have a splint on your hand. General instructions  Take over-the-counter and prescription medicines only as told by your health care provider.  Keep all follow-up visits as told by your health care provider. This is important. Contact a health care provider if:  Your symptoms are not improving with home care. Summary  Trigger finger, also called stenosing tenosynovitis, causes your finger to get stuck in a bent position. This can make it difficult and painful to straighten your finger.  This condition develops when a finger tendon or tendon sheath thickens.  Treatment may include resting your finger, wearing a splint, and taking medicines.  In severe cases, surgery to open the tendon sheath may be needed. This information is not intended to replace advice given to you by your health care provider. Make sure you discuss any questions you have with your health care provider. Document Revised: 11/04/2018 Document Reviewed: 11/04/2018 Elsevier Patient Education  [place].

## 2020-09-03 ENCOUNTER — Other Ambulatory Visit: Payer: Self-pay | Admitting: *Deleted

## 2020-09-03 DIAGNOSIS — E1065 Type 1 diabetes mellitus with hyperglycemia: Secondary | ICD-10-CM

## 2020-09-03 MED ORDER — LEVEMIR FLEXTOUCH 100 UNIT/ML ~~LOC~~ SOPN: PEN_INJECTOR | SUBCUTANEOUS | 11 refills | Status: DC

## 2020-09-03 NOTE — Telephone Encounter (Signed)
Sent Rx [place] to--[facility].

## 2020-09-06 ENCOUNTER — Telehealth: Payer: Self-pay | Admitting: Pharmacist

## 2020-09-06 NOTE — Progress Notes (Addendum)
Chronic Care Management Pharmacy Assistant   Name: Leslie Lane  MRN: [id number] DOB: March 28, 1956   Reason for Encounter: Medication Review    Primary concerns for visit include: Medication Review  Recent office visits:  The patient had video visit with Dr. Doyne Keel and office visit with Dr. Dorena Bodo on 09/02/20  Recent consult visits:  None  Hospital visits:  None in previous 6 months  Medications: Outpatient Encounter Medications as of 09/06/2020  Medication Sig   Accu-Chek Softclix Lancets lancets Use as instructed to check blood sugar 4 times per day dx code E10.65   Alcohol Swabs (B-D SINGLE USE SWABS REGULAR) PADS USE FOUR TIMES DAILY   aspirin EC 81 MG tablet Take 81 mg by mouth every morning.   Blood Glucose Calibration (ACCU-CHEK AVIVA) SOLN USE AS DIRECTED   Blood Glucose Monitoring Suppl (ACCU-CHEK AVIVA PLUS) w/Device KIT USE AS DIRECTED   diclofenac Sodium (VOLTAREN) 1 % GEL APPLY 2 GRAMS EXTERNALLY TO THE AFFECTED AREA FOUR TIMES DAILY   donepezil (ARICEPT) 10 MG tablet TAKE 1/2 TABLET BY MOUTH DAILY FOR 2 WEEKS. INCREASE TO 1 TABLET DAILY AND. CONTINUE   gabapentin (NEURONTIN) 100 MG capsule TAKE 1 TO 2 CAPSULES BY MOUTH TWICE DAILY   glucose blood (ACCU-CHEK AVIVA PLUS) test strip TEST BLOOD SUGAR FOUR TIMES DAILY   GVOKE HYPOPEN 2-PACK 1 MG/0.2ML SOAJ Inject 1 mg into the skin as needed. For Hypo   insulin detemir (LEVEMIR FLEXTOUCH) 100 UNIT/ML FlexPen 3 units in am and 6 at bedtime   insulin lispro (HUMALOG KWIKPEN) 100 UNIT/ML KwikPen inject 10 UNITS UNDER THE SKIN EVERY MORNING AND inject EIGHT UNITS EVERY EVENING PER sliding scale. inject 1-3 UNITS AS NEEDED FOR high blood sugars (Patient taking differently: 2-4 Units. inject 10 UNITS UNDER THE SKIN EVERY MORNING AND inject EIGHT UNITS EVERY EVENING PER sliding scale. inject 1-3 UNITS AS NEEDED FOR high blood sugars)   lamoTRIgine (LAMICTAL) 100 MG tablet Take 1 tablet (100 mg total) by mouth 2 (two) times  daily.   lidocaine (LIDODERM) 5 % UNWRAP AND APPLY 1 PATCH TO SKIN EVERY DAY(REMOVE AND DISCARD PATCH WITHIN 12 HOURS OR AS DIRECTED BY DOCTOR)   lipase/protease/amylase (CREON) 36000 UNITS CPEP capsule Take 2 capsules (72,000 Units total) by mouth 3 (three) times daily before meals.   loratadine (CLARITIN) 10 MG tablet Take 10 mg by mouth as needed for allergies.   lovastatin (MEVACOR) 40 MG tablet Take 1 tablet (40 mg total) by mouth at bedtime.   Magnesium 500 MG TABS Take 1 tablet by mouth as needed.   metoCLOPramide (REGLAN) 5 MG tablet TAKE 1 TABLET THREE TIMES DAILY BEFORE MEALS AS NEEDED   Multiple Vitamin (MULTIVITAMIN) tablet Take 1 tablet by mouth daily. Reported on 11/22/2015   Omega-3 Fatty Acids (FISH OIL) 1000 MG CAPS Take 1 capsule by mouth daily.   omeprazole (PRILOSEC) 40 MG capsule TAKE ONE CAPSULE BY MOUTH ONCE DAILY   traZODone (DESYREL) 100 MG tablet Take 4 tablets (400 mg total) by mouth at bedtime.   TRUEPLUS PEN NEEDLES 31G X 8 MM MISC Use as instructed to inject insulin 4 times daily as directed.   vitamin B-12 (CYANOCOBALAMIN) 1000 MCG tablet Take 1 tablet by mouth daily.   vitamin E 1000 UNIT capsule Take 1,000 Units by mouth daily.   No facility-administered encounter medications on file as of 09/06/2020.    Care Gaps: Ophthalmology exam over due Star Rating Drugs: NO ACE/ARB   Reviewed chart  for medication changes ahead of medication coordination call.  No OVs, Consults, or hospital visits since last care coordination call/Pharmacist visit. (If appropriate, list visit date, provider name)  No medication changes indicated OR if recent visit, treatment plan here.  BP Readings from Last 3 Encounters:  09/02/20 128/62  07/27/20 124/62  06/10/20 122/68    Lab Results  Component Value Date   HGBA1C 6.1 06/10/2020     Patient obtains medications through Vials  30 Days   Last adherence delivery included:  Accu chek test strips test blood sugar four times  daily Lovastatin 40 mg 1 tab by mouth at bedtime Gabapentin 100 mg take 1 to 2 caps by mouth twice daily breakfast and bedtime  Patient declined omeprazole, lamotrigine, and trazodone additional supply on hand. Explanation of abundance on hand (ie #30 due to overlapping fills or previous adherence issues etc)  Patient is due for next adherence delivery on: 09/08/20. Called patient and reviewed medications and coordinated delivery.  This delivery to include: Accu chek test strips test blood sugar four times daily Lovastatin 40 mg 1 tab by mouth at bedtime Gabapentin 100 mg take 1 to 2 caps by mouth twice daily breakfast and bedtime Levemir Flex Touch 100 units 3 units in the am and 6 at bedtime Omperazole 40 mg 1 cap by mouth at breakfast Lamotrigine 100 mg 1 tab by mouth twice daily, breakfast and bedtime   Patient declined the following medications trazodone, humalog and pen needles due to patient has additional supply on hand  Patient needs refills for accu chek test strips from Dr. Dwyane Dee, Gabapentin from Dr. Sharlet Salina and lamotrigine from Dr. Doyne Keel.  Confirmed delivery date of 09/08/20, advised patient that pharmacy will contact them the morning of delivery.   Wendy Poet, [place] [phone]

## 2020-09-07 ENCOUNTER — Other Ambulatory Visit: Payer: Self-pay | Admitting: Internal Medicine

## 2020-09-09 ENCOUNTER — Telehealth: Payer: Self-pay | Admitting: Pharmacist

## 2020-09-09 NOTE — Progress Notes (Addendum)
A call was received from the patient who states that she only received one Levemir pen with her delivery but was suppose to receive a box of five. She would like a full box.   Wendy Poet, [place] [phone]   Pharmacist Addendum: Per discussion with pharmacy, 1 Levemir pen is a 34 day supply and patient had requested 30-day supply of medications previously, which is why it was filled this way. One box of Levemir (15 mL) is a 167 day supply, pharmacy reports this is covered by insurance, so they will fill it this way and send patient the remaining 4 pens.  Charlton Haws, [facility]

## 2020-09-16 ENCOUNTER — Other Ambulatory Visit: Payer: Self-pay

## 2020-09-16 ENCOUNTER — Other Ambulatory Visit (INDEPENDENT_AMBULATORY_CARE_PROVIDER_SITE_OTHER): Payer: Medicare Other

## 2020-09-16 ENCOUNTER — Other Ambulatory Visit: Payer: Self-pay | Admitting: *Deleted

## 2020-09-16 DIAGNOSIS — E1065 Type 1 diabetes mellitus with hyperglycemia: Secondary | ICD-10-CM | POA: Diagnosis not present

## 2020-09-16 DIAGNOSIS — E7800 Pure hypercholesterolemia, unspecified: Secondary | ICD-10-CM

## 2020-09-16 LAB — COMPREHENSIVE METABOLIC PANEL
ALT: 19 U/L (ref 0–35)
AST: 25 U/L (ref 0–37)
Albumin: 4.2 g/dL (ref 3.5–5.2)
Alkaline Phosphatase: 46 U/L (ref 39–117)
BUN: 24 mg/dL — ABNORMAL HIGH (ref 6–23)
CO2: 32 mEq/L (ref 19–32)
Calcium: 9.8 mg/dL (ref 8.4–10.5)
Chloride: 100 mEq/L (ref 96–112)
Creatinine, Ser: 0.77 mg/dL (ref 0.40–1.20)
GFR: 80.39 mL/min (ref >60.00)
Glucose, Bld: 108 mg/dL — ABNORMAL HIGH (ref 70–99)
Potassium: 3.9 mEq/L (ref 3.5–5.1)
Sodium: 138 mEq/L (ref 135–145)
Total Bilirubin: 0.5 mg/dL (ref 0.2–1.2)
Total Protein: 6.9 g/dL (ref 6.0–8.3)

## 2020-09-16 LAB — LIPID PANEL
Cholesterol: 181 mg/dL (ref 0–200)
HDL: 84.4 mg/dL (ref >39.00)
LDL Cholesterol: 84 mg/dL (ref 0–99)
NonHDL: 97.06
Total CHOL/HDL Ratio: 2
Triglycerides: 64 mg/dL (ref 0.0–149.0)
VLDL: 12.8 mg/dL (ref 0.0–40.0)

## 2020-09-16 LAB — HEMOGLOBIN A1C: Hgb A1c MFr Bld: 6.8 % — ABNORMAL HIGH (ref 4.6–6.5)

## 2020-09-16 MED ORDER — ACCU-CHEK AVIVA PLUS VI STRP: ORAL_STRIP | 2 refills | Status: DC

## 2020-09-16 MED ORDER — METOCLOPRAMIDE HCL 5 MG PO TABS: ORAL_TABLET | ORAL | 1 refills | Status: DC

## 2020-09-16 MED ORDER — LEVEMIR FLEXTOUCH 100 UNIT/ML ~~LOC~~ SOPN: PEN_INJECTOR | SUBCUTANEOUS | 11 refills | Status: DC

## 2020-09-20 ENCOUNTER — Other Ambulatory Visit: Payer: Self-pay

## 2020-09-20 ENCOUNTER — Encounter: Payer: Self-pay | Admitting: Endocrinology

## 2020-09-20 ENCOUNTER — Ambulatory Visit (INDEPENDENT_AMBULATORY_CARE_PROVIDER_SITE_OTHER): Payer: Medicare Other | Admitting: Endocrinology

## 2020-09-20 VITALS — BP 120/64 | HR 76 | Resp 18 | Ht 64.0 in | Wt 105.8 lb

## 2020-09-20 DIAGNOSIS — E7800 Pure hypercholesterolemia, unspecified: Secondary | ICD-10-CM

## 2020-09-20 DIAGNOSIS — E1065 Type 1 diabetes mellitus with hyperglycemia: Secondary | ICD-10-CM | POA: Diagnosis not present

## 2020-09-20 MED ORDER — ACCU-CHEK AVIVA PLUS VI STRP: ORAL_STRIP | 2 refills | Status: DC

## 2020-09-20 MED ORDER — FLUCONAZOLE 150 MG PO TABS: 150.0000 mg | ORAL_TABLET | Freq: Once | ORAL | 0 refills | Status: AC

## 2020-09-20 NOTE — Progress Notes (Signed)
Patient ID: Leslie Lane, female   DOB: Jun 14, 1956, 65 y.o.   MRN: [id number]  Reason for Appointment: Endocrinology follow-up   History of Present Illness    Diagnosis: Type 1 DIABETES MELITUS, diagnosed 1967      She has had labile blood sugar control over the last several years even though A1c has been usually around 7% She has had less lability and hypoglycemia with taking b.i.d. Lantus compared once a day She has been very sensitive to fast acting insulin and frequently does not require mealtime coverage She cannot tolerate Toujeo because of reported episode of headache, bloating and nausea with the first dose  RECENT history:  Insulin regimen: LEVEMIR 4 units in the morning and 6 in the evening Novolog 1-2 units up to 3 times a day   Her A1c is 6.8 compared to 6.1  Current blood sugar patterns and problems identified:  She has been using the freestyle libre but she has not used it since 3/7 and she thinks she was getting some inaccurate readings and falsely high at times  She has also been switched from [place] to [facility] because of overnight hyperglycemia  Her blood sugar patterns however recently on the fingersticks are similar to before  She may have less hypoglycemia compared to before although difficult to evaluate; overall 11% of readings are below 70  FASTING readings are still mostly high as low as 59 today but was 322 yesterday  Otherwise HYPERGLYCEMIA is occurring inconsistently in the afternoons and evenings  She thinks that her fasting readings depend on how much snacking she is doing late in the evenings and last night she did not have any snack after 6 or 7 PM  She was told to try 3 units Levemir in the morning when she increased it to 4 units  Again she has no symptoms of hypoglycemia even with documented readings as low as 29  Hypoglycemia: Symptoms may be absent with low blood sugars and does not have early warning symptoms. Gets confused  and she frequently depends on her mother to recognize low sugars and treat them.  Her mother knows how to give Glucagon injection  She will treat her low blood sugars usually with juice, does carry glucose tablets when not at home       PRE-MEAL Fasting Lunch Dinner Bedtime Overall  Glucose range:  59-322    110-247  29-341  Mean/median:  193  118  123   143+/-73   POST-MEAL PC Breakfast PC Lunch PC Dinner  Glucose range:     Mean/median:    131     Data from freestyle libre for 2/22 until 3/7   Glycemic patterns summary: Blood sugars are highly variable but rising significantly overnight and higher postprandially after lunch and late evening after 10 PM   Average blood sugar lowest 114 from 8-10 PM and also 159 late morning and highest 251 from 2-4 AM   Prior    CGM use % of time  96  2-week average/GV  139/37  Time in range       70% was 69  % Time Above 180  22  % Time above 250   % Time Below 70 8    Blood sugars from Accu-Chek  PRE-MEAL Fasting Lunch Dinner Bedtime Overall  Glucose range:       Averages:  198  88  70   121   POST-MEAL PC Breakfast PC Lunch PC Dinner  Glucose range:  Averages:   95  89      Wt Readings from Last 3 Encounters:  09/20/20 105 lb 12.8 oz (48 kg)  09/02/20 104 lb (47.2 kg)  07/27/20 106 lb 6.4 oz (48.3 kg)    Lab Results  Component Value Date   HGBA1C 6.8 (H) 09/16/2020   HGBA1C 6.1 06/10/2020   HGBA1C 6.4 02/17/2020   Lab Results  Component Value Date   MICROALBUR 1.0 06/10/2020   LDLCALC 84 09/16/2020   CREATININE 0.77 09/16/2020        Allergies as of 09/20/2020      Reactions   Penicillins Anaphylaxis   Sulfonamide Derivatives Anaphylaxis      Medication List       Accurate as of September 20, 2020  3:23 PM. If you have any questions, ask your nurse or doctor.        Accu-Chek Aviva Plus test strip Generic drug: glucose blood TEST BLOOD SUGAR FOUR TIMES DAILY   Accu-Chek Aviva Plus w/Device  Kit USE AS DIRECTED   Accu-Chek Aviva Soln USE AS DIRECTED   Accu-Chek Softclix Lancets lancets Use as instructed to check blood sugar 4 times per day dx code E10.65   aspirin EC 81 MG tablet Take 81 mg by mouth every morning.   B-D SINGLE USE SWABS REGULAR Pads USE FOUR TIMES DAILY   diclofenac Sodium 1 % Gel Commonly known as: VOLTAREN APPLY 2 GRAMS EXTERNALLY TO THE AFFECTED AREA FOUR TIMES DAILY   donepezil 10 MG tablet Commonly known as: ARICEPT TAKE 1/2 TABLET BY MOUTH DAILY FOR 2 WEEKS. INCREASE TO 1 TABLET DAILY AND. CONTINUE   Fish Oil 1000 MG Caps Take 1 capsule by mouth daily.   gabapentin 100 MG capsule Commonly known as: NEURONTIN TAKE 1 TO 2 CAPSULES BY MOUTH TWICE DAILY   Gvoke HypoPen 2-Pack 1 MG/0.2ML Soaj Generic drug: Glucagon Inject 1 mg into the skin as needed. For Hypo   insulin lispro 100 UNIT/ML KwikPen Commonly known as: HumaLOG KwikPen inject 10 UNITS UNDER THE SKIN EVERY MORNING AND inject EIGHT UNITS EVERY EVENING PER sliding scale. inject 1-3 UNITS AS NEEDED FOR high blood sugars What changed: how much to take   lamoTRIgine 100 MG tablet Commonly known as: LAMICTAL Take 1 tablet (100 mg total) by mouth 2 (two) times daily.   Levemir FlexTouch 100 UNIT/ML FlexPen Generic drug: insulin detemir 3 units in am and 6 at bedtime   lidocaine 5 % Commonly known as: LIDODERM UNWRAP AND APPLY 1 PATCH TO SKIN EVERY DAY(REMOVE AND DISCARD PATCH WITHIN 12 HOURS OR AS DIRECTED BY DOCTOR)   lipase/protease/amylase 36000 UNITS Cpep capsule Commonly known as: Creon Take 2 capsules (72,000 Units total) by mouth 3 (three) times daily before meals.   loratadine 10 MG tablet Commonly known as: CLARITIN Take 10 mg by mouth as needed for allergies.   lovastatin 40 MG tablet Commonly known as: MEVACOR Take 1 tablet (40 mg total) by mouth at bedtime.   Magnesium 500 MG Tabs Take 1 tablet by mouth as needed.   metoCLOPramide 5 MG tablet Commonly  known as: REGLAN TAKE 1 TABLET THREE TIMES DAILY BEFORE MEALS AS NEEDED   multivitamin tablet Take 1 tablet by mouth daily. Reported on 11/22/2015   omeprazole 40 MG capsule Commonly known as: PRILOSEC TAKE ONE CAPSULE BY MOUTH ONCE DAILY   traZODone 100 MG tablet Commonly known as: DESYREL Take 4 tablets (400 mg total) by mouth at bedtime.   TRUEplus Pen Needles  31G X 8 MM Misc Generic drug: Insulin Pen Needle Use as instructed to inject insulin 4 times daily as directed.   vitamin B-12 1000 MCG tablet Commonly known as: CYANOCOBALAMIN Take 1 tablet by mouth daily.   vitamin E 1000 UNIT capsule Take 1,000 Units by mouth daily.       Allergies:  Allergies  Allergen Reactions   Penicillins Anaphylaxis   Sulfonamide Derivatives Anaphylaxis    Past Medical History:  Diagnosis Date   ALLERGIC RHINITIS    Allergy    Anemia    Anxiety    Arthritis    BIPOLAR AFFECTIVE DISORDER    Cataract    DEPRESSION    Diabetes mellitus type I ([place])    DIABETES MELLITUS, TYPE II    follows with endo   Fibromyalgia    GERD    HEPATITIS B    HYPERLIPIDEMIA    LOW BACK PAIN, CHRONIC    MIGRAINE HEADACHE    NARCOTIC ABUSE    hx of   NECK PAIN, CHRONIC    Neuropathy    Seizures ([place])    pt states, "if my blood sugar drops to the 20's, I convulse.  It hasn't happened in a long time."   SMOKER     Past Surgical History:  Procedure Laterality Date   CESAREAN SECTION     COLONOSCOPY     TUBAL LIGATION     UPPER GASTROINTESTINAL ENDOSCOPY      Family History  Problem Relation Age of Onset   Arthritis Mother    Colon cancer Mother        ? age of dx   Arthritis Father    Kidney disease Father    Kidney cancer Father    Heart attack Brother    Bladder Cancer Sister    Hyperlipidemia Other    Kidney cancer Paternal Aunt    Anxiety disorder Neg Hx    Bipolar disorder Neg Hx    Depression Neg Hx    Breast cancer Neg Hx     Esophageal cancer Neg Hx    Stomach cancer Neg Hx    Rectal cancer Neg Hx     Social History:  reports that she has been smoking cigarettes. She has a 80.00 pack-year smoking history. She has never used smokeless tobacco. She reports current alcohol use. She reports that she does not use drugs.  Review of Systems:   Had gastroparesis treated with Reglan tid-qid, usually has no nausea with taking Reglan  NEUROPATHY: She has burning in her legs and feet at times treated with gabapentin 100 mg, she also thinks this helps the pains in her arms at night   DEPRESSION: She has had long-standing depression and anxiety on long-term treatment  HYPERLIPIDEMIA: The lipid abnormality consists of elevated LDL   Is  on lovastatin 40 mg with  good control   Lab Results  Component Value Date   CHOL 181 09/16/2020   HDL 84.40 09/16/2020   LDLCALC 84 09/16/2020   LDLDIRECT 101.7 03/26/2012   TRIG 64.0 09/16/2020   CHOLHDL 2 09/16/2020    Memory difficulties: She was given Aricept by the neurologist, is taking 10 mg but ran out of the prescription refills and still has not refilled it   Examination:   BP 120/64 (BP Location: Left Arm, Patient Position: Sitting, Cuff Size: Normal)    Pulse 76    Resp 18    Ht 5' 4"  (1.626 m)    Wt 105 lb  12.8 oz (48 kg)    SpO2 96%    BMI 18.16 kg/m   Body mass index is 18.16 kg/m.    ASSESSMENT/ PLAN:   Diabetes type 1 with poor control  See history of present illness for detailed discussion of current insulin, blood sugar patterns, problems identified  A1c has been under 7% and now 6.8  She is using both the freestyle libre and the Accu-Chek although recently only the Accu-Chek Her freestyle Elenor Legato is not consistently accurate and sometimes reading higher Also recently has difficulty with the sensor working or sticking  She again has very labile blood sugars as discussed above  Not clear if she is definitely benefiting from switching to Levemir but  some of her hypoglycemia in the afternoon is again related to increased physical activity for which she does not compensate  Recommendations:  Insulin: Levemir 3 units in the morning and 5-7 in the evening  She will adjust her evening dose at bedtime based on whether she is eating large amounts of snacks or fatty foods  She will only take NovoLog for significantly higher readings or if eating a large meal at breakfast and once  She will start back on the freestyle libre but if she is having consistently difficult issues with this will try to get her Dexcom again  Review blood sugar patterns again in 2 months  LIPIDS: LDL 84  Patient Instructions  Take a snack before going walking  Levemir 3 in am  Take 5-7 in pm based on how much snacking at [employer] 09/20/2020, 3:23 PM

## 2020-09-20 NOTE — Patient Instructions (Signed)
Take a snack before going walking  Levemir 3 in am  Take 5-7 in pm based on how much snacking at nite

## 2020-09-21 ENCOUNTER — Ambulatory Visit: Payer: Medicare Other | Admitting: Endocrinology

## 2020-09-27 ENCOUNTER — Telehealth: Payer: Self-pay

## 2020-09-27 NOTE — Telephone Encounter (Signed)
Patient called in upset with the pharmacy mail service because medication still hasn't come yet. Wanting to send any and all medication to another pharmacy from now on.   Needing these medications sent to pharmacy now   metoCLOPramide (REGLAN) 5 MG tablet  glucose blood (ACCU-CHEK AVIVA PLUS) test strip  [id number] - [place], [address] AT [place]   No longer wanting to [facility]

## 2020-09-28 ENCOUNTER — Other Ambulatory Visit: Payer: Self-pay | Admitting: *Deleted

## 2020-09-28 DIAGNOSIS — E1065 Type 1 diabetes mellitus with hyperglycemia: Secondary | ICD-10-CM

## 2020-09-28 MED ORDER — METOCLOPRAMIDE HCL 5 MG PO TABS: ORAL_TABLET | ORAL | 1 refills | Status: DC

## 2020-09-28 MED ORDER — ACCU-CHEK AVIVA PLUS VI STRP: ORAL_STRIP | 2 refills | Status: DC

## 2020-09-28 NOTE — Telephone Encounter (Signed)
Re-sent Rx to [facility]

## 2020-09-30 ENCOUNTER — Other Ambulatory Visit: Payer: Self-pay

## 2020-09-30 ENCOUNTER — Ambulatory Visit
Admission: RE | Admit: 2020-09-30 | Discharge: 2020-09-30 | Disposition: A | Discharge status: Home / Self Care | Payer: Medicare Other | Source: Ambulatory Visit | Attending: Internal Medicine | Admitting: Internal Medicine

## 2020-09-30 DIAGNOSIS — E1043 Type 1 diabetes mellitus with diabetic autonomic (poly)neuropathy: Secondary | ICD-10-CM | POA: Diagnosis not present

## 2020-09-30 DIAGNOSIS — Z1231 Encounter for screening mammogram for malignant neoplasm of breast: Secondary | ICD-10-CM

## 2020-09-30 DIAGNOSIS — Z794 Long term (current) use of insulin: Secondary | ICD-10-CM | POA: Diagnosis not present

## 2020-10-01 ENCOUNTER — Encounter: Payer: Self-pay | Admitting: Internal Medicine

## 2020-10-01 ENCOUNTER — Ambulatory Visit
Admission: RE | Admit: 2020-10-01 | Discharge: 2020-10-01 | Disposition: A | Discharge status: Home / Self Care | Payer: Medicare Other | Source: Ambulatory Visit | Attending: Internal Medicine | Admitting: Internal Medicine

## 2020-10-01 ENCOUNTER — Ambulatory Visit (INDEPENDENT_AMBULATORY_CARE_PROVIDER_SITE_OTHER): Payer: Medicare Other | Admitting: Internal Medicine

## 2020-10-01 VITALS — BP 128/70 | HR 79 | Temp 98.9°F | Resp 18 | Ht 64.0 in | Wt 105.8 lb

## 2020-10-01 DIAGNOSIS — G8929 Other chronic pain: Secondary | ICD-10-CM

## 2020-10-01 DIAGNOSIS — M25511 Pain in right shoulder: Secondary | ICD-10-CM | POA: Diagnosis not present

## 2020-10-01 DIAGNOSIS — R519 Headache, unspecified: Secondary | ICD-10-CM | POA: Diagnosis not present

## 2020-10-01 DIAGNOSIS — G44311 Acute post-traumatic headache, intractable: Secondary | ICD-10-CM

## 2020-10-01 DIAGNOSIS — E1043 Type 1 diabetes mellitus with diabetic autonomic (poly)neuropathy: Secondary | ICD-10-CM

## 2020-10-01 DIAGNOSIS — K3184 Gastroparesis: Secondary | ICD-10-CM | POA: Diagnosis not present

## 2020-10-01 DIAGNOSIS — S0003XA Contusion of scalp, initial encounter: Secondary | ICD-10-CM | POA: Diagnosis not present

## 2020-10-01 DIAGNOSIS — R55 Syncope and collapse: Secondary | ICD-10-CM | POA: Diagnosis not present

## 2020-10-01 NOTE — Assessment & Plan Note (Signed)
CT head stat ordered to rule out intracranial bleeding or skull fracture given no assessment post fall and mild confusion since and intractable headaches

## 2020-10-01 NOTE — Assessment & Plan Note (Signed)
Checking x-ray shoulder to assess

## 2020-10-01 NOTE — Patient Instructions (Signed)
Please go to [place] for the head CT now.

## 2020-10-01 NOTE — Assessment & Plan Note (Signed)
She has had a severe hypoglycemia episode and she is asked to call her endocrine provider. She is asked to temporarily cut back on walking to prevent hypoglycemia and make sure to eat frequent small meals with protein.

## 2020-10-01 NOTE — Progress Notes (Signed)
   Subjective:   Patient ID: Leslie Lane, female    DOB: 30-Oct-1955, 65 y.o.   MRN: [id number]  HPI The patient is a 65 YO female coming in for right shoulder pain (chronic for some time, overall worse since fall, has not taken anything for this) and with new episode of hypoglycemia (episode of low sugar, not clear how low, she had syncope/seizure and was out of it, her mother monitored her until she stopped and came to, she had been walking a lot and did eat later than usual, did eat and this kicked in after some amount of time, they did not call EMS or check her sugar during episode, her mother cannot do this due to arthritis) and head injury (fall/syncope with hypoglycemia and then some seizure like activity, she had eaten but still got low sugar, hit head and severe bruising on the face, some confusion since, headache constant since).   Review of Systems  Constitutional: Negative.   HENT: Negative.   Eyes: Negative.   Respiratory: Negative for cough, chest tightness and shortness of breath.   Cardiovascular: Negative for chest pain, palpitations and leg swelling.  Gastrointestinal: Negative for abdominal distention, abdominal pain, constipation, diarrhea, nausea and vomiting.  Musculoskeletal: Positive for arthralgias and myalgias.  Skin: Negative.   Neurological: Positive for seizures, syncope and headaches.  Psychiatric/Behavioral: Negative.     Objective:  Physical Exam Constitutional:      Appearance: She is well-developed.  HENT:     Head: Normocephalic.     Comments: Bruising over left eye and temporal region with severe tenderness, eyes reactive Cardiovascular:     Rate and Rhythm: Normal rate and regular rhythm.  Pulmonary:     Effort: Pulmonary effort is normal. No respiratory distress.     Breath sounds: Normal breath sounds. No wheezing or rales.  Abdominal:     General: Bowel sounds are normal. There is no distension.     Palpations: Abdomen is soft.      Tenderness: There is no abdominal tenderness. There is no rebound.  Musculoskeletal:        General: Tenderness present.     Cervical back: Normal range of motion.  Skin:    General: Skin is warm and dry.  Neurological:     Mental Status: She is alert and oriented to person, place, and time.     Coordination: Coordination normal.     Vitals:   10/01/20 1352  BP: 128/70  Pulse: 79  Resp: 18  Temp: 98.9 F (37.2 C)  TempSrc: Oral  SpO2: 96%  Weight: 105 lb 12.8 oz (48 kg)  Height: 5\' 4"  (1.626 m)    This visit occurred during the SARS-CoV-2 public health emergency.  Safety protocols were in place, including screening questions prior to the visit, additional usage of staff PPE, and extensive cleaning of exam room while observing appropriate contact time as indicated for disinfecting solutions.   Assessment & Plan:

## 2020-10-05 ENCOUNTER — Telehealth: Payer: Medicare Other

## 2020-10-07 ENCOUNTER — Telehealth: Payer: Self-pay | Admitting: Pharmacist

## 2020-10-07 NOTE — Progress Notes (Addendum)
A call was made to the patient today for Medication Coordination Call. Spoke with patient and she stated that she had switched back to [employer] with [facility]. Patient stated that she no longer needed any assistance from [facility].  Preferred pharmacy has been updated:  [facility] [place], [address] [place] [address] Phone: [phone] Fax: [phone]  [place], [address], [address] [place], [place] [address] Phone: [phone] Fax: [place] Pharmacist Assistant [phone]

## 2020-10-11 ENCOUNTER — Ambulatory Visit: Payer: Medicare Other | Admitting: Internal Medicine

## 2020-10-12 ENCOUNTER — Telehealth: Payer: Self-pay | Admitting: Internal Medicine

## 2020-10-12 MED ORDER — OMEPRAZOLE 40 MG PO CPDR: DELAYED_RELEASE_CAPSULE | ORAL | 1 refills | Status: DC

## 2020-10-12 NOTE — Telephone Encounter (Signed)
1.Medication Requested: omeprazole (PRILOSEC) 40 MG capsule    2. Pharmacy (Name, Street, [place]): [place], [address], [address]  3. On Med List: yes   4. Last Visit with PCP: 10-01-20  5. Next visit date with PCP: 12-13-20   Agent: Please be advised that RX refills may take up to 3 business days. We ask that you follow-up with your pharmacy.

## 2020-10-31 DIAGNOSIS — Z794 Long term (current) use of insulin: Secondary | ICD-10-CM | POA: Diagnosis not present

## 2020-10-31 DIAGNOSIS — E1043 Type 1 diabetes mellitus with diabetic autonomic (poly)neuropathy: Secondary | ICD-10-CM | POA: Diagnosis not present

## 2020-11-03 ENCOUNTER — Other Ambulatory Visit: Payer: Self-pay | Admitting: *Deleted

## 2020-11-03 ENCOUNTER — Telehealth: Payer: Self-pay | Admitting: Internal Medicine

## 2020-11-03 DIAGNOSIS — E1065 Type 1 diabetes mellitus with hyperglycemia: Secondary | ICD-10-CM

## 2020-11-03 MED ORDER — LOVASTATIN 40 MG PO TABS: 40.0000 mg | ORAL_TABLET | Freq: Every day | ORAL | 2 refills | Status: DC

## 2020-11-03 MED ORDER — METOCLOPRAMIDE HCL 5 MG PO TABS: ORAL_TABLET | ORAL | 1 refills | Status: DC

## 2020-11-03 NOTE — Telephone Encounter (Signed)
Medication has been sent to the patient's pharmacy.  

## 2020-11-03 NOTE — Telephone Encounter (Signed)
1.Medication Requested: lovastatin (MEVACOR) 40 MG tablet    2. Pharmacy (Name, Street, [place]): [place], [address], [address]  3. On Med List: yes   4. Last Visit with PCP: 10-01-20  5. Next visit date with PCP: 12-13-20  Patient is almost out of medication    Agent: Please be advised that RX refills may take up to 3 business days. We ask that you follow-up with your pharmacy.

## 2020-11-05 ENCOUNTER — Telehealth: Payer: Self-pay | Admitting: Endocrinology

## 2020-11-05 NOTE — Telephone Encounter (Signed)
Linda with [employer] called on behalf of Patient to request a new RX for BD Pen Needles 31g x 44mm (Pen needles can be a different brand, however, request the size that is listed above) be sent to:  [place], [address], [address] Phone:  [phone]  Fax:  [phone]

## 2020-11-08 ENCOUNTER — Other Ambulatory Visit: Payer: Self-pay | Admitting: *Deleted

## 2020-11-08 DIAGNOSIS — E1065 Type 1 diabetes mellitus with hyperglycemia: Secondary | ICD-10-CM

## 2020-11-08 MED ORDER — METOCLOPRAMIDE HCL 5 MG PO TABS: ORAL_TABLET | ORAL | 1 refills | Status: DC

## 2020-11-08 MED ORDER — "PEN NEEDLES 3/16"" 31G X 5 MM MISC": 3 refills | Status: DC

## 2020-11-08 NOTE — Telephone Encounter (Signed)
Rx updated and sent to [facility]

## 2020-11-09 ENCOUNTER — Other Ambulatory Visit: Payer: Self-pay | Admitting: *Deleted

## 2020-11-09 MED ORDER — INSULIN PEN NEEDLE 31G X 8 MM MISC: 3 refills | Status: DC

## 2020-11-15 ENCOUNTER — Telehealth: Payer: Self-pay | Admitting: Internal Medicine

## 2020-11-15 NOTE — Telephone Encounter (Signed)
    Patient requesting refill for gabapentin (NEURONTIN) 100 MG capsule  Pharmacy [facility]

## 2020-11-16 MED ORDER — GABAPENTIN 100 MG PO CAPS: ORAL_CAPSULE | ORAL | 0 refills | Status: DC

## 2020-11-16 NOTE — Telephone Encounter (Signed)
Medication has been sent to the patient's pharmacy.  

## 2020-11-19 ENCOUNTER — Telehealth: Payer: Self-pay | Admitting: Endocrinology

## 2020-11-19 MED ORDER — GVOKE HYPOPEN 2-PACK 1 MG/0.2ML ~~LOC~~ SOAJ
1.0000 mg | SUBCUTANEOUS | 1 refills | Status: DC | PRN
Start: 2020-11-19 — End: 2021-01-15

## 2020-11-19 NOTE — Telephone Encounter (Signed)
MEDICATION: GVOKE HYPOPEN 2-PACK 1 MG  PHARMACY:  [facility] on [address]   HAS THE PATIENT CONTACTED THEIR PHARMACY?  Yes, no refills left  IS THIS A 90 DAY SUPPLY :  no  IS PATIENT OUT OF MEDICATION: YES, LAST pen administered this morning due to PT being unconcious  IF NOT; HOW MUCH IS LEFT: none   LAST APPOINTMENT DATE: @5 /04/2021  NEXT APPOINTMENT DATE:@5 /24/2022  DO WE HAVE YOUR PERMISSION TO LEAVE A DETAILED MESSAGE?:  OTHER COMMENTS:

## 2020-11-19 NOTE — Telephone Encounter (Signed)
Refill sent.

## 2020-11-23 ENCOUNTER — Encounter: Payer: Self-pay | Admitting: Endocrinology

## 2020-11-23 ENCOUNTER — Other Ambulatory Visit: Payer: Self-pay

## 2020-11-23 ENCOUNTER — Ambulatory Visit: Payer: Medicare Other | Admitting: Endocrinology

## 2020-11-23 VITALS — BP 140/82 | HR 90 | Ht 64.0 in | Wt 107.0 lb

## 2020-11-23 DIAGNOSIS — E10649 Type 1 diabetes mellitus with hypoglycemia without coma: Secondary | ICD-10-CM | POA: Diagnosis not present

## 2020-11-23 DIAGNOSIS — E1065 Type 1 diabetes mellitus with hyperglycemia: Secondary | ICD-10-CM | POA: Diagnosis not present

## 2020-11-23 HISTORY — DX: Type 1 diabetes mellitus with hypoglycemia without coma: E10.649

## 2020-11-23 NOTE — Progress Notes (Addendum)
Patient ID: Leslie Lane, female   DOB: 03/08/1956, 65 y.o.   MRN: [id number]  Reason for Appointment: Endocrinology follow-up   History of Present Illness    Diagnosis: Type 1 DIABETES MELITUS, diagnosed 1967      She has had labile blood sugar control over the last several years even though A1c has been usually around 7% She has had less lability and hypoglycemia with taking b.i.d. Lantus compared once a day She has been very sensitive to fast acting insulin and frequently does not require mealtime coverage She cannot tolerate Toujeo because of reported episode of headache, bloating and nausea with the first dose  RECENT history:  Insulin regimen: LEVEMIR 3 units in the morning and 6 at 11 pm HUMALOG 1-2 units up to 3 times a day   Her A1c is 6.8 compared to 6.1  Current blood sugar patterns and problems identified:  She has been using the freestyle libre but she does not think it is consistently accurate but not clear what the differences between this and the fingersticks  She has been repeatedly given instructions on take her Humalog but she does not remember days and still continues to take frequently excessive doses causing frequent hypoglycemia  She says she has had a recent episode of severe hypoglycemia requiring glucagon  Most of her hypoglycemia appears to be after dinner and she continues to take Humalog even when blood sugars before eating are near normal  Also may be getting tendency to hypoglycemia when she is more active and does not have a snack before going out for walking or other physical activities  As before difficult to control her overnight blood sugars because of late night eating when she is having insomnia  Mealtimes: Breakfast 8-8.30 am, lunch 12 noon , dinner 5 pm  Hypoglycemia: Symptoms may be absent with low blood sugars and does not have early warning symptoms. Gets confused and she frequently depends on her mother to recognize low  sugars and treat them.  Her mother knows how to give Glucagon injection  She will treat her low blood sugars usually with juice, does carry glucose tablets when not at home  From freestyle libre version 2 for the last 2 weeks the following interpretation is made   Hyperglycemia is present mostly late at night starting around 11 PM through 5 AM also  Blood sugars are fairly consistently low normal or low around 8-9 PM  Also has some hypoglycemia tendency around 9-10 AM after breakfast  Postprandial readings are low after dinner and relatively lower after breakfast but may be moderately higher after lunch  On an average blood sugars are within the target range except around 2 AM  Moderate variability is present      CGM use % of time 96  2-week average/GV 139  Time in range      64%  % Time Above 180  19+3  % Time above 250   % Time Below 70  14     PRE-MEAL Fasting Lunch Dinner Bedtime Overall  Glucose range:       Averages:  141   112  126    POST-MEAL PC Breakfast PC Lunch PC Dinner  Glucose range:     Averages:  108  150  85    Previous readings:   PRE-MEAL Fasting Lunch Dinner Bedtime Overall  Glucose range:  59-322    110-247  29-341  Mean/median:  193  118  123   143+/-73  POST-MEAL PC Breakfast PC Lunch PC Dinner  Glucose range:     Mean/median:    131      Wt Readings from Last 3 Encounters:  11/23/20 107 lb (48.5 kg)  10/01/20 105 lb 12.8 oz (48 kg)  09/20/20 105 lb 12.8 oz (48 kg)    Lab Results  Component Value Date   HGBA1C 6.8 (H) 09/16/2020   HGBA1C 6.1 06/10/2020   HGBA1C 6.4 02/17/2020   Lab Results  Component Value Date   MICROALBUR 1.0 06/10/2020   LDLCALC 84 09/16/2020   CREATININE 0.77 09/16/2020        Allergies as of 11/23/2020      Reactions   Penicillins Anaphylaxis   Sulfonamide Derivatives Anaphylaxis      Medication List       Accurate as of Nov 23, 2020  3:38 PM. If you have any questions, ask your nurse or  doctor.        Accu-Chek Aviva Plus test strip Generic drug: glucose blood TEST BLOOD SUGAR FOUR TIMES DAILY   Accu-Chek Aviva Plus w/Device Kit USE AS DIRECTED   Accu-Chek Aviva Soln USE AS DIRECTED   Accu-Chek Softclix Lancets lancets Use as instructed to check blood sugar 4 times per day dx code E10.65   aspirin EC 81 MG tablet Take 81 mg by mouth every morning.   B-D SINGLE USE SWABS REGULAR Pads USE FOUR TIMES DAILY   diclofenac Sodium 1 % Gel Commonly known as: VOLTAREN APPLY 2 GRAMS EXTERNALLY TO THE AFFECTED AREA FOUR TIMES DAILY   donepezil 10 MG tablet Commonly known as: ARICEPT TAKE 1/2 TABLET BY MOUTH DAILY FOR 2 WEEKS. INCREASE TO 1 TABLET DAILY AND. CONTINUE   Fish Oil 1000 MG Caps Take 1 capsule by mouth daily.   gabapentin 100 MG capsule Commonly known as: NEURONTIN Take 1 to 2 capsules by mouth twice daily.   Gvoke HypoPen 2-Pack 1 MG/0.2ML Soaj Generic drug: Glucagon Inject 1 mg into the skin as needed. For Hypo   insulin lispro 100 UNIT/ML KwikPen Commonly known as: HumaLOG KwikPen inject 10 UNITS UNDER THE SKIN EVERY MORNING AND inject EIGHT UNITS EVERY EVENING PER sliding scale. inject 1-3 UNITS AS NEEDED FOR high blood sugars What changed: how much to take   Insulin Pen Needle 31G X 8 MM Misc Use 4 pen needles per day to inject Insulin   lamoTRIgine 100 MG tablet Commonly known as: LAMICTAL Take 1 tablet (100 mg total) by mouth 2 (two) times daily.   Levemir FlexTouch 100 UNIT/ML FlexPen Generic drug: insulin detemir 3 units in am and 6 at bedtime   lidocaine 5 % Commonly known as: LIDODERM UNWRAP AND APPLY 1 PATCH TO SKIN EVERY DAY(REMOVE AND DISCARD PATCH WITHIN 12 HOURS OR AS DIRECTED BY DOCTOR)   lipase/protease/amylase 36000 UNITS Cpep capsule Commonly known as: Creon Take 2 capsules (72,000 Units total) by mouth 3 (three) times daily before meals.   loratadine 10 MG tablet Commonly known as: CLARITIN Take 10 mg by mouth  as needed for allergies.   lovastatin 40 MG tablet Commonly known as: MEVACOR Take 1 tablet (40 mg total) by mouth at bedtime.   Magnesium 500 MG Tabs Take 1 tablet by mouth as needed.   metoCLOPramide 5 MG tablet Commonly known as: REGLAN TAKE 1 TABLET THREE TIMES DAILY BEFORE MEALS AS NEEDED   multivitamin tablet Take 1 tablet by mouth daily. Reported on 11/22/2015   omeprazole 40 MG capsule Commonly known as: PRILOSEC  TAKE ONE CAPSULE BY MOUTH ONCE DAILY   traZODone 100 MG tablet Commonly known as: DESYREL Take 4 tablets (400 mg total) by mouth at bedtime.   vitamin B-12 1000 MCG tablet Commonly known as: CYANOCOBALAMIN Take 1 tablet by mouth daily.   vitamin E 1000 UNIT capsule Take 1,000 Units by mouth daily.       Allergies:  Allergies  Allergen Reactions  . Penicillins Anaphylaxis  . Sulfonamide Derivatives Anaphylaxis    Past Medical History:  Diagnosis Date  . ALLERGIC RHINITIS   . Allergy   . Anemia   . Anxiety   . Arthritis   . BIPOLAR AFFECTIVE DISORDER   . Cataract   . DEPRESSION   . Diabetes mellitus type I ([place])   . DIABETES MELLITUS, TYPE II    follows with endo  . Fibromyalgia   . GERD   . HEPATITIS B   . HYPERLIPIDEMIA   . LOW BACK PAIN, CHRONIC   . MIGRAINE HEADACHE   . NARCOTIC ABUSE    hx of  . NECK PAIN, CHRONIC   . Neuropathy   . Seizures ([place])    pt states, "if my blood sugar drops to the 20's, I convulse.  It hasn't happened in a long time."  . SMOKER     Past Surgical History:  Procedure Laterality Date  . CESAREAN SECTION    . COLONOSCOPY    . TUBAL LIGATION    . UPPER GASTROINTESTINAL ENDOSCOPY      Family History  Problem Relation Age of Onset  . Arthritis Mother   . Colon cancer Mother        ? age of dx  . Arthritis Father   . Kidney disease Father   . Kidney cancer Father   . Heart attack Brother   . Bladder Cancer Sister   . Hyperlipidemia Other   . Kidney cancer Paternal Aunt   . Anxiety disorder  Neg Hx   . Bipolar disorder Neg Hx   . Depression Neg Hx   . Breast cancer Neg Hx   . Esophageal cancer Neg Hx   . Stomach cancer Neg Hx   . Rectal cancer Neg Hx     Social History:  reports that she has been smoking cigarettes. She has a 80.00 pack-year smoking history. She has never used smokeless tobacco. She reports current alcohol use. She reports that she does not use drugs.  Review of Systems:   Had gastroparesis treated with Reglan tid-qid with good control  NEUROPATHY: She has history of burning in her legs and feet at times treated with gabapentin 100 mg   DEPRESSION: She has had long-standing depression and anxiety on long-term treatment, recently having more symptoms  HYPERLIPIDEMIA: The lipid abnormality consists of elevated LDL   Is  on lovastatin 40 mg with  good control   Lab Results  Component Value Date   CHOL 181 09/16/2020   HDL 84.40 09/16/2020   LDLCALC 84 09/16/2020   LDLDIRECT 101.7 03/26/2012   TRIG 64.0 09/16/2020   CHOLHDL 2 09/16/2020    Memory difficulties: She was given Aricept by the neurologist, is taking 10 mg but ran out of the prescription refills and still has not refilled it   Examination:   BP 140/82 (BP Location: Left Arm, Patient Position: Sitting, Cuff Size: Normal)   Pulse 90   Ht 5' 4"  (1.626 m)   Wt 107 lb (48.5 kg)   SpO2 95%   BMI 18.37 kg/m  Body mass index is 18.37 kg/m.    ASSESSMENT/ PLAN:   Diabetes type 1 with poor control  See history of present illness for detailed discussion of current insulin, blood sugar patterns, problems identified  A1c has been under 7% and last 6.8  She is using both the freestyle libre and the Accu-Chek However not clear her freestyle Elenor Legato is consistently accurate and appears to be getting significant amount of low blood sugars with 14% below 70 As before she has hypoglycemia unawareness She continues to not understand the need to prevent hypoglycemia but she is still taking  excessive amounts of Humalog which she rarely needs Her glucose variability is 40%  Since she has high readings overnight she likely needs more Humalog to cover her late night snacks but she does not do this Also needs to not take any insulin at suppertime because her lowest blood sugars are within the 3 hours after dinner  Again discussed in detail the day-to-day management of her insulin, prevention of hypoglycemia and to take Humalog only when blood sugars are over 200 and needing significant amount of carbohydrate  She will also take 2 units of Humalog when she has excessive snacks at bedtime  No change in Levemir at this time but may consider reducing changing over to only evening doses  Follow-up in 3 months with A1c  Total visit time including counseling = 30 minutes  Patient Instructions  NO HUMALOG AT SUPPER  At Bfst and lunch take Humalog only if sugar over 200 or eating more than 2 starches   BEFORE going [facility] if sugar < 140 eat a starchy snack         [name] 11/23/2020, 3:38 PM

## 2020-11-23 NOTE — Patient Instructions (Addendum)
NO HUMALOG AT SUPPER  At Bfst and lunch take Humalog only if sugar over 200 or eating more than 2 starches   BEFORE going [facility] if sugar < 140 eat a starchy snack

## 2020-12-03 ENCOUNTER — Encounter: Payer: Self-pay | Admitting: Neurology

## 2020-12-03 ENCOUNTER — Other Ambulatory Visit: Payer: Self-pay

## 2020-12-03 ENCOUNTER — Ambulatory Visit: Payer: Medicare Other | Admitting: Neurology

## 2020-12-03 VITALS — BP 172/76 | HR 97 | Ht 64.0 in | Wt 106.0 lb

## 2020-12-03 DIAGNOSIS — G3184 Mild cognitive impairment, so stated: Secondary | ICD-10-CM | POA: Diagnosis not present

## 2020-12-03 MED ORDER — DONEPEZIL HCL 10 MG PO TABS: ORAL_TABLET | ORAL | 3 refills | Status: DC

## 2020-12-03 NOTE — Progress Notes (Signed)
Patient was seen, evaluated, and treatment plan was discussed with the Advanced Practice Provider. MMSE today 22/30 (27/30 in 05/2017). She had misunderstood Donepezil instructions and each time she gets refills, she would cut pills in half instead of taking 1 tablet daily. She was lost to follow-up and ran out of medication. She states her memory is not good and became tearful. Discussed doing Neurocognitive testing to further evaluate cognitive changes. She has been seeing Psychiatry with improvement in symptoms but she is "nervous all the time."  I have also reviewed the orders written for this patient which were under my direction. I agree with the findings and the plan of care as documented by the Advanced Practice Provider.

## 2020-12-03 NOTE — Progress Notes (Signed)
Assessment/Plan:    Mild cognitive impairment This is a pleasant 65 year old right-handed woman with a history of mild cognitive impairment, seen today in a follow-up visit.The patient is off of Donepezil 10 mg daily since the last visit because she failed to follow up until today, more than 1 year later. MMSE today is 22/30 with deficiencies in attention, calculation and delayed recall    Recommendation are as follows:   Restart  donepezil 10 mg daily Side effects  were discussed, which include nausea, vomiting, diarrhea, vivid dreams, and muscle cramps.  Patient instructed to call the clinic any of these symptoms are present.  Neuropsychological evaluation at our office Follow up in 6  months. Discussed safety both in and out of the home.  Discussed the importance of regular daily schedule with inclusion of crossword puzzles to maintain brain function.  Continue to monitor mood by Psychiatry. Stay active exercising  at least 30 minutes at least 3 times a week.  Naps should be scheduled and should be no longer than 60 minutes and should not occur after 2 PM.   Continue to monitor driving   Case discussed with Dr. Delice Lesch who agrees with the plan     Subjective:   Leslie Lane is a pleasant 65 year old woman with a history of type 1 diabetes mellitus, hyperlipidemia, fibromyalgia, migraines, major depressive disorder, GAD, bipolar disorder, and with a history of mild cognitive impairment, last assessed via telephone visit on 07/31/2019 due to COVID restrictions.  Since that visit, patient stopped donepezil 10 mg daily when running out of it.  She went to [facility], and there were no more refills, stating that  "I did not know I had to come here to get more pills ".  She made an appointment  at the front desk, and is being seen today for further evaluation. She is here alone.  Previous records as well as any outside records available were reviewed prior to todays visit.  Patient feels  that memory is "OK, but I can't remember what I was going to say " she feels that this has gotten worse.  Patient lives with her mother, which adds stress. Mood is better controlled since followed by Psychiatry, without worsening depression or irritability. Denies hallucinations or paranoia. Sleeps better with trazodone.  Denies vivid dreams or sleepwalking. Patient dresses up and bathes without help.  Denies missing medications and uses a pillbox.  Denies living objects in unusual places.  She controls her own finances, without missing any bills or over paying.  Appetite is good but had an episode of hypoglycemia today  required Glucagon.  She cooks occasionally, mother does most of the cooking, and denies trouble swallowing.Denies leaving the stove on or the faucet on. Ambulating without difficulty without walker or cane. Patient continues to drive with GPS denies getting lost. Denies headaches, trauma, or injuries to the head, double vision,  dizziness, focal numbness or tingling, unilateral weakness or tremors or seizures. Denies urine incontinence or retention. Denies constipation or diarrhea. She continues to smoke "like a chimney"    History on Initial Assessment 12/18/2018: This is a 65 year old woman with a history of type I DM, hyperlipidemia, fibromyalgia, migraines, major depressive disorder, GAD, rule out bipolar disorder, presenting for evaluation of memory loss. She is alone in the office with no family to corroborate history. She states memory changes started after she was in the hospital in a diabetic coma at age 61. She states she has not noticed any  changes since then, memory has been about the same since then. She initially could not drive a car or remember a movie she had watched. She acquired a guardian to manage her finances. She lives with her mother. She has been told she repeats herself and says the same thing every morning. She used to leave the stove on, but has been more vigilant.  She has been driving and has gotten lost driving in unfamiliar roads. She manages her medications independently. She states she does not like math and was not good in school. She has reported her memory concerns to her psychiatrist and reported that she cannot focus on anything. She is easily irritable. No paranoia or hallucinations. Sleep is good with Trazodone. No family history of dementia. No history of significant head injuries. She used to drink heavily when younger, then cut down to a beer every now and then after her diabetic coma.   She has headaches that improve when she is not smoking. She has a history of migraines and has mild bitemporal headaches, taking daily magnesium. She has chronic neck and back pain and neuropathy in her feet. No dizziness, vision changes, focal weakness, bowel/bladder dysfunction, anosmia, or tremors.     PREVIOUS MEDICATIONS: none  CURRENT MEDICATIONS:  Outpatient Encounter Medications as of 12/03/2020  Medication Sig  . Accu-Chek Softclix Lancets lancets Use as instructed to check blood sugar 4 times per day dx code E10.65  . Alcohol Swabs (B-D SINGLE USE SWABS REGULAR) PADS USE FOUR TIMES DAILY  . aspirin EC 81 MG tablet Take 81 mg by mouth every morning.  . Blood Glucose Calibration (ACCU-CHEK AVIVA) SOLN USE AS DIRECTED  . Blood Glucose Monitoring Suppl (ACCU-CHEK AVIVA PLUS) w/Device KIT USE AS DIRECTED  . diclofenac Sodium (VOLTAREN) 1 % GEL APPLY 2 GRAMS EXTERNALLY TO THE AFFECTED AREA FOUR TIMES DAILY  . gabapentin (NEURONTIN) 100 MG capsule Take 1 to 2 capsules by mouth twice daily.  . Glucagon (GVOKE HYPOPEN 2-PACK) 1 MG/0.2ML SOAJ Inject 1 mg into the skin as needed. For Hypo  . glucose blood (ACCU-CHEK AVIVA PLUS) test strip TEST BLOOD SUGAR FOUR TIMES DAILY  . insulin detemir (LEVEMIR FLEXTOUCH) 100 UNIT/ML FlexPen 3 units in am and 6 at bedtime  . insulin lispro (HUMALOG KWIKPEN) 100 UNIT/ML KwikPen inject 10 UNITS UNDER THE SKIN EVERY  MORNING AND inject EIGHT UNITS EVERY EVENING PER sliding scale. inject 1-3 UNITS AS NEEDED FOR high blood sugars (Patient taking differently: 2-4 Units. inject 10 UNITS UNDER THE SKIN EVERY MORNING AND inject EIGHT UNITS EVERY EVENING PER sliding scale. inject 1-3 UNITS AS NEEDED FOR high blood sugars)  . Insulin Pen Needle 31G X 8 MM MISC Use 4 pen needles per day to inject Insulin  . lamoTRIgine (LAMICTAL) 100 MG tablet Take 1 tablet (100 mg total) by mouth 2 (two) times daily.  Marland Kitchen lidocaine (LIDODERM) 5 % UNWRAP AND APPLY 1 PATCH TO SKIN EVERY DAY(REMOVE AND DISCARD PATCH WITHIN 12 HOURS OR AS DIRECTED BY DOCTOR)  . lipase/protease/amylase (CREON) 36000 UNITS CPEP capsule Take 2 capsules (72,000 Units total) by mouth 3 (three) times daily before meals.  Marland Kitchen loratadine (CLARITIN) 10 MG tablet Take 10 mg by mouth as needed for allergies.  Marland Kitchen lovastatin (MEVACOR) 40 MG tablet Take 1 tablet (40 mg total) by mouth at bedtime.  . Magnesium 500 MG TABS Take 1 tablet by mouth as needed.  . metoCLOPramide (REGLAN) 5 MG tablet TAKE 1 TABLET THREE TIMES DAILY BEFORE MEALS AS  NEEDED  . Multiple Vitamin (MULTIVITAMIN) tablet Take 1 tablet by mouth daily. Reported on 11/22/2015  . Omega-3 Fatty Acids (FISH OIL) 1000 MG CAPS Take 1 capsule by mouth daily.  Marland Kitchen omeprazole (PRILOSEC) 40 MG capsule TAKE ONE CAPSULE BY MOUTH ONCE DAILY  . traZODone (DESYREL) 100 MG tablet Take 4 tablets (400 mg total) by mouth at bedtime.  . vitamin B-12 (CYANOCOBALAMIN) 1000 MCG tablet Take 1 tablet by mouth daily.  . vitamin E 1000 UNIT capsule Take 1,000 Units by mouth daily.  Marland Kitchen donepezil (ARICEPT) 10 MG tablet TAKE 1/2 TABLET BY MOUTH DAILY FOR 2 WEEKS. INCREASE TO 1 TABLET DAILY AND. CONTINUE (Patient not taking: Reported on 12/03/2020)   No facility-administered encounter medications on file as of 12/03/2020.     Objective:     PHYSICAL EXAMINATION:    VITALS:   Vitals:   12/03/20 1521  BP: (!) 172/76  Pulse: 97  SpO2: 97%   Weight: 106 lb (48.1 kg)  Height: 5' 4"  (1.626 m)    GEN:  The patient appears stated age and is in NAD. HEENT:  Normocephalic, atraumatic.   Neurological examination:  General: NAD, well-groomed, appears stated age. Orientation: The patient is alert. Oriented to person, place and date  Cranial nerves: There is good facial symmetry.The speech is fluent and clear. No aphasia or dysarthria. Fund of knowledge is appropriate. Recent memory is reduced, but remote memory is intact. Attention and concentration are impaired. Able to name objects and repeat phrases.  Hearing is intact to conversational tone.  Delayed recall is impaired  Sensation: Sensation is intact to light touch throughout Motor: Strength is at least antigravity x4.  Montreal Cognitive Assessment  07/30/2019 12/18/2018  Visuospatial/ Executive (0/5) - 1  Naming (0/3) - 3  Attention: Read list of digits (0/2) 1 1  Attention: Read list of letters (0/1) 1 1  Attention: Serial 7 subtraction starting at 100 (0/3) 1 2  Language: Repeat phrase (0/2) 0 0  Language : Fluency (0/1) 0 0  Abstraction (0/2) 2 0  Delayed Recall (0/5) 4 0  Orientation (0/6) 6 5  Total - 13  Adjusted Score (based on education) - 14     MMSE - [place] Exam 12/03/2020 05/30/2017  Orientation to time 5 5  Orientation to Place 5 5  Registration 3 3  Attention/ Calculation 0 3  Recall 0 2  Language- name 2 objects 2 2  Language- repeat 1 1  Language- follow 3 step command 3 3  Language- read & follow direction 1 1  Write a sentence 1 1  Copy design 1 1  Total score 22 27      Movement examination: Tone: There is normal tone in the UE/LE Abnormal movements:  no tremor.  No myoclonus.  No asterixis.   Coordination:  There is no decremation with RAM's. Normal finger to nose  Gait and Station: The patient has no difficulty arising out of a deep-seated chair without the use of the hands. The patient's stride length is good.  Gait is  cautious and narrow.    CBC CBC Latest Ref Rng & Units 06/10/2020 06/04/2018 03/20/2017  WBC 4.0 - 10.5 K/uL 4.4 3.8(L) 3.3(L)  Hemoglobin 12.0 - 15.0 g/dL 12.0 12.9 13.8  Hematocrit 36.0 - 46.0 % 36.7 38.5 41.5  Platelets 150.0 - 400.0 K/uL 237.0 259.0 213.0     CMP Latest Ref Rng & Units 09/16/2020 06/10/2020 02/17/2020  Glucose 70 - 99 mg/dL 108(H) 61(L)  134(H)  BUN 6 - 23 mg/dL 24(H) 24(H) -  Creatinine 0.40 - 1.20 mg/dL 0.77 0.77 -  Sodium 135 - 145 mEq/L 138 140 -  Potassium 3.5 - 5.1 mEq/L 3.9 4.2 -  Chloride 96 - 112 mEq/L 100 101 -  CO2 19 - 32 mEq/L 32 34(H) -  Calcium 8.4 - 10.5 mg/dL 9.8 9.7 -  Total Protein 6.0 - 8.3 g/dL 6.9 6.9 -  Total Bilirubin 0.2 - 1.2 mg/dL 0.5 0.5 -  Alkaline Phos 39 - 117 U/L 46 44 -  AST 0 - 37 U/L 25 25 -  ALT 0 - 35 U/L 19 19 -       Total time spent on today's visit was 50  minutes, including both face-to-face time and nonface-to-face time.  Time included that spent on review of records (prior notes available to me/labs/imaging if pertinent), discussing treatment and goals, answering patient's questions and coordinating care.  Cc:  [name], MD Sharene Butters, PA-C

## 2020-12-03 NOTE — Patient Instructions (Addendum)
It was a pleasure to see you today at our office.   Recommendations:  Meds: Restart Donepezil 10mg : take 1 tablet every night Schedule Neurocognitive testing Follow up in 6 months    RECOMMENDATIONS FOR ALL PATIENTS WITH MEMORY PROBLEMS: 1. Continue to exercise (Recommend 30 minutes of walking everyday, or 3 hours every week) 2. Increase social interactions - continue going to [place] and enjoy social gatherings with friends and family 3. Eat healthy, avoid fried foods and eat more fruits and vegetables 4. Maintain adequate blood pressure, blood sugar, and blood cholesterol level. Reducing the risk of stroke and cardiovascular disease also helps promoting better memory. 5. Avoid stressful situations. Live a simple life and avoid aggravations. Organize your time and prepare for the next day in anticipation. 6. Sleep well, avoid any interruptions of sleep and avoid any distractions in the bedroom that may interfere with adequate sleep quality 7. Avoid sugar, avoid sweets as there is a strong link between excessive sugar intake, diabetes, and cognitive impairment We discussed the Mediterranean diet, which has been shown to help patients reduce the risk of progressive memory disorders and reduces cardiovascular risk. This includes eating fish, eat fruits and green leafy vegetables, nuts like almonds and hazelnuts, walnuts, and also use olive oil. Avoid fast foods and fried foods as much as possible. Avoid sweets and sugar as sugar use has been linked to worsening of memory function.  There is always a concern of gradual progression of memory problems. If this is the case, then we may need to adjust level of care according to patient needs. Support, both to the patient and caregiver, should then be put into place.      You have been referred for a neuropsychological evaluation (i.e., evaluation of memory and thinking abilities). Please bring someone with you to this appointment if possible, as it  is helpful for the doctor to hear from both you and another adult who knows you well. Please bring eyeglasses and hearing aids if you wear them.    The evaluation will take approximately 3 hours and has two parts:   . The first part is a clinical interview with the neuropsychologist (Dr. Melvyn Novas or Dr. Nicole Kindred). During the interview, the neuropsychologist will speak with you and the individual you brought to the appointment.    . The second part of the evaluation is testing with the doctor's technician Hinton Dyer or Maudie Mercury). During the testing, the technician will ask you to remember different types of material, solve problems, and answer some questionnaires. Your family member will not be present for this portion of the evaluation.   Please note: We must reserve several hours of the neuropsychologist's time and the psychometrician's time for your evaluation appointment. As such, there is a No-Show fee of $100. If you are unable to attend any of your appointments, please contact our office as soon as possible to reschedule.    FALL PRECAUTIONS: Be cautious when walking. Scan the area for obstacles that may increase the risk of trips and falls. When getting up in the mornings, sit up at the edge of the bed for a few minutes before getting out of bed. Consider elevating the bed at the head end to avoid drop of blood pressure when getting up. Walk always in a well-lit room (use night lights in the walls). Avoid area rugs or power cords from appliances in the middle of the walkways. Use a walker or a cane if necessary and consider physical therapy for balance exercise.  Get your eyesight checked regularly.  FINANCIAL OVERSIGHT: Supervision, especially oversight when making financial decisions or transactions is also recommended.  HOME SAFETY: Consider the safety of the kitchen when operating appliances like stoves, microwave oven, and blender. Consider having supervision and share cooking responsibilities until no  longer able to participate in those. Accidents with firearms and other hazards in the house should be identified and addressed as well.   ABILITY TO BE LEFT ALONE: If patient is unable to contact 911 operator, consider using LifeLine, or when the need is there, arrange for someone to stay with patients. Smoking is a fire hazard, consider supervision or cessation. Risk of wandering should be assessed by caregiver and if detected at any point, supervision and safe proof recommendations should be instituted.  MEDICATION SUPERVISION: Inability to self-administer medication needs to be constantly addressed. Implement a mechanism to ensure safe administration of the medications.   DRIVING: Regarding driving, in patients with progressive memory problems, driving will be impaired. We advise to have someone else do the driving if trouble finding directions or if minor accidents are reported. Independent driving assessment is available to determine safety of driving.   If you are interested in the driving assessment, you can contact the following:  [employer] in [place]  [place] [phone] or [phone]    [place] refers to food and lifestyle choices that are based on the traditions of countries located on the [employer]. This way of eating has been shown to help prevent certain conditions and improve outcomes for people who have chronic diseases, like kidney disease and heart disease. What are tips for following this plan? Lifestyle   Cook and eat meals together with your family, when possible.  Drink enough fluid to keep your urine clear or pale yellow.  Be physically active every day. This includes:  Aerobic exercise like running or swimming.  Leisure activities like gardening, walking, or housework.  Get 7-8 hours of sleep  each night.  If recommended by your health care provider, drink red wine in moderation. This means 1 glass a day for nonpregnant women and 2 glasses a day for men. A glass of wine equals 5 oz (150 mL). Reading food labels   Check the serving size of packaged foods. For foods such as rice and pasta, the serving size refers to the amount of cooked product, not dry.  Check the total fat in packaged foods. Avoid foods that have saturated fat or trans fats.  Check the ingredients list for added sugars, such as corn syrup. Shopping   At the grocery store, buy most of your food from the areas near the walls of the store. This includes:  Fresh fruits and vegetables (produce).  Grains, beans, nuts, and seeds. Some of these may be available in unpackaged forms or large amounts (in bulk).  Fresh seafood.  Poultry and eggs.  Low-fat dairy products.  Buy whole ingredients instead of prepackaged foods.  Buy fresh fruits and vegetables in-season from local farmers markets.  Buy frozen fruits and vegetables in resealable bags.  If you do not have access to quality fresh seafood, buy precooked frozen shrimp or canned fish, such as tuna, salmon, or sardines.  Buy small amounts of raw or cooked vegetables, salads, or olives from the deli or salad bar at your store.  Stock your pantry so you always have certain foods on hand, such as  olive oil, canned tuna, canned tomatoes, rice, pasta, and beans. Cooking   Cook foods with extra-virgin olive oil instead of using butter or other vegetable oils.  Have meat as a side dish, and have vegetables or grains as your main dish. This means having meat in small portions or adding small amounts of meat to foods like pasta or stew.  Use beans or vegetables instead of meat in common dishes like chili or lasagna.  Experiment with different cooking methods. Try roasting or broiling vegetables instead of steaming or sauteing them.  Add frozen vegetables to  soups, stews, pasta, or rice.  Add nuts or seeds for added healthy fat at each meal. You can add these to yogurt, salads, or vegetable dishes.  Marinate fish or vegetables using olive oil, lemon juice, garlic, and fresh herbs. Meal planning   Plan to eat 1 vegetarian meal one day each week. Try to work up to 2 vegetarian meals, if possible.  Eat seafood 2 or more times a week.  Have healthy snacks readily available, such as:  Vegetable sticks with hummus.  Greek yogurt.  Fruit and nut trail mix.  Eat balanced meals throughout the week. This includes:  Fruit: 2-3 servings a day  Vegetables: 4-5 servings a day  Low-fat dairy: 2 servings a day  Fish, poultry, or lean meat: 1 serving a day  Beans and legumes: 2 or more servings a week  Nuts and seeds: 1-2 servings a day  Whole grains: 6-8 servings a day  Extra-virgin olive oil: 3-4 servings a day  Limit red meat and sweets to only a few servings a month What are my food choices?  Mediterranean diet  Recommended  Grains: Whole-grain pasta. Brown rice. Bulgar wheat. Polenta. Couscous. Whole-wheat bread. [place].  Vegetables: Artichokes. Beets. Broccoli. Cabbage. Carrots. Eggplant. Green beans. Chard. Kale. Spinach. Onions. Leeks. Peas. Squash. Tomatoes. Peppers. Radishes.  Fruits: Apples. Apricots. Avocado. Berries. Bananas. Cherries. Dates. Figs. Grapes. Lemons. Melon. Oranges. Peaches. Plums. Pomegranate.  Meats and other protein foods: Beans. Almonds. Sunflower seeds. Pine nuts. Peanuts. [place]. Salmon. Scallops. Shrimp. [place]. Tilapia. Clams. Oysters. Eggs.  Dairy: Low-fat milk. Cheese. Greek yogurt.  Beverages: Water. Red wine. Herbal tea.  Fats and oils: Extra virgin olive oil. Avocado oil. Grape seed oil.  Sweets and desserts: [place] yogurt with honey. Baked apples. Poached pears. Trail mix.  Seasoning and other foods: Basil. Cilantro. Coriander. Cumin. Mint. Parsley. Sage. Rosemary. Tarragon. Garlic.  Oregano. Thyme. Pepper. Balsalmic vinegar. Tahini. Hummus. Tomato sauce. Olives. Mushrooms.  Limit these  Grains: Prepackaged pasta or rice dishes. Prepackaged cereal with added sugar.  Vegetables: Deep fried potatoes (french fries).  Fruits: Fruit canned in syrup.  Meats and other protein foods: Beef. Pork. Lamb. Poultry with skin. Hot dogs. Berniece Salines.  Dairy: Ice cream. Sour cream. Whole milk.  Beverages: Juice. Sugar-sweetened soft drinks. Beer. Liquor and spirits.  Fats and oils: Butter. Canola oil. Vegetable oil. Beef fat (tallow). Lard.  Sweets and desserts: Cookies. Cakes. Pies. Candy.  Seasoning and other foods: Mayonnaise. Premade sauces and marinades.  The items listed may not be a complete list. Talk with your dietitian about what dietary choices are right for you. Summary  The Mediterranean diet includes both food and lifestyle choices.  Eat a variety of fresh fruits and vegetables, beans, nuts, seeds, and whole grains.  Limit the amount of red meat and sweets that you eat.  Talk with your health care provider about whether it is safe for you to drink red  wine in moderation. This means 1 glass a day for nonpregnant women and 2 glasses a day for men. A glass of wine equals 5 oz (150 mL). This information is not intended to replace advice given to you by your health care provider. Make sure you discuss any questions you have with your health care provider. Document Released: 02/10/2016 Document Revised: 03/14/2016 Document Reviewed: 02/10/2016 Elsevier Interactive Patient Education  2017 [employer].

## 2020-12-08 ENCOUNTER — Telehealth: Payer: Medicare Other

## 2020-12-09 ENCOUNTER — Other Ambulatory Visit: Payer: Self-pay

## 2020-12-09 ENCOUNTER — Telehealth: Payer: Self-pay | Admitting: Endocrinology

## 2020-12-09 ENCOUNTER — Telehealth (HOSPITAL_COMMUNITY): Payer: Self-pay | Admitting: *Deleted

## 2020-12-09 ENCOUNTER — Other Ambulatory Visit (HOSPITAL_COMMUNITY): Payer: Self-pay | Admitting: Psychiatry

## 2020-12-09 ENCOUNTER — Telehealth (HOSPITAL_COMMUNITY): Payer: Medicare Other | Admitting: Psychiatry

## 2020-12-09 DIAGNOSIS — E1065 Type 1 diabetes mellitus with hyperglycemia: Secondary | ICD-10-CM

## 2020-12-09 DIAGNOSIS — F332 Major depressive disorder, recurrent severe without psychotic features: Secondary | ICD-10-CM

## 2020-12-09 DIAGNOSIS — G4701 Insomnia due to medical condition: Secondary | ICD-10-CM

## 2020-12-09 DIAGNOSIS — F4010 Social phobia, unspecified: Secondary | ICD-10-CM

## 2020-12-09 MED ORDER — LEVEMIR FLEXTOUCH 100 UNIT/ML ~~LOC~~ SOPN: PEN_INJECTOR | SUBCUTANEOUS | 5 refills | Status: DC

## 2020-12-09 MED ORDER — TRAZODONE HCL 100 MG PO TABS: 400.0000 mg | ORAL_TABLET | Freq: Every day | ORAL | 0 refills | Status: DC

## 2020-12-09 MED ORDER — LAMOTRIGINE 100 MG PO TABS: 100.0000 mg | ORAL_TABLET | Freq: Two times a day (BID) | ORAL | 0 refills | Status: DC

## 2020-12-09 NOTE — Telephone Encounter (Signed)
Pt needs a refill on insulin detemir (LEVEMIR FLEXTOUCH) 100 UNIT/ML FlexPen  [id number] - [place], [place] - [address]

## 2020-12-09 NOTE — Telephone Encounter (Signed)
Pt has called twice regarding getting "my antidepressants" filled. VM was left advising pt that she had an appointment today @ 1600 with Dr. Doyne Keel and refills would be done at that time. Pt called back evidently without listening to VM asking again for her antidepressants. T/c was returned again and had to leave another VM on home phone (both times) as pt says that she has gotten a smart phone and doesn't know how to use it. Pt encouraged to call front desk to reschedule appointment and that messae would be sent to provider.

## 2020-12-09 NOTE — Telephone Encounter (Signed)
I called the patient at our scheduled appointment time. There was no answer. I left a voice message for patient to call the clinic back at their convinence. I have provided a 30 day refill on meds. Pt will need to call to reschedule her appointment.

## 2020-12-09 NOTE — Telephone Encounter (Signed)
Refill sent as requested. 

## 2020-12-13 ENCOUNTER — Ambulatory Visit: Payer: Medicare Other | Admitting: Internal Medicine

## 2020-12-13 ENCOUNTER — Telehealth (HOSPITAL_COMMUNITY): Payer: Self-pay | Admitting: *Deleted

## 2020-12-13 NOTE — Telephone Encounter (Signed)
Pt calling requesting refills. Pt states again that she is having trouble using her new smart phone. Writer returned call however had to leave VM stating that Dr. Doyne Keel refilled Lamictal and Trazodone and advised to make an appointment for future refills.

## 2020-12-16 ENCOUNTER — Encounter: Payer: Self-pay | Admitting: Internal Medicine

## 2020-12-16 ENCOUNTER — Ambulatory Visit (INDEPENDENT_AMBULATORY_CARE_PROVIDER_SITE_OTHER): Payer: Medicare Other

## 2020-12-16 ENCOUNTER — Ambulatory Visit (INDEPENDENT_AMBULATORY_CARE_PROVIDER_SITE_OTHER): Payer: Medicare Other | Admitting: Internal Medicine

## 2020-12-16 ENCOUNTER — Other Ambulatory Visit: Payer: Self-pay | Admitting: Internal Medicine

## 2020-12-16 ENCOUNTER — Other Ambulatory Visit: Payer: Self-pay

## 2020-12-16 VITALS — BP 140/60 | HR 75 | Temp 99.3°F | Ht 64.0 in | Wt 103.2 lb

## 2020-12-16 DIAGNOSIS — M25511 Pain in right shoulder: Secondary | ICD-10-CM

## 2020-12-16 DIAGNOSIS — E108 Type 1 diabetes mellitus with unspecified complications: Secondary | ICD-10-CM | POA: Diagnosis not present

## 2020-12-16 DIAGNOSIS — G8929 Other chronic pain: Secondary | ICD-10-CM | POA: Diagnosis not present

## 2020-12-16 DIAGNOSIS — J410 Simple chronic bronchitis: Secondary | ICD-10-CM

## 2020-12-16 DIAGNOSIS — E10649 Type 1 diabetes mellitus with hypoglycemia without coma: Secondary | ICD-10-CM

## 2020-12-16 LAB — POCT GLYCOSYLATED HEMOGLOBIN (HGB A1C): Hemoglobin A1C: 6.4 % — AB (ref 4.0–5.6)

## 2020-12-16 NOTE — Progress Notes (Signed)
   Subjective:   Patient ID: Leslie Lane, female    DOB: 01-Mar-1956, 65 y.o.   MRN: [id number]  HPI The patient is a 65 YO female coming in for ongoing right shoulder pain (ordered x-ray which she did not get done, taking otc for this, does limit her ability to do ADLs at time due to limited mobility), and diabetes (seeing endo and due for HgA1c, she will get here today, does still have low sugars to 40s about the same frequency as prior, she is not walking as much due to hot weather), and tobacco abuse (no desire to quit, she smokes due to boredom and several packs per day, open to lung cancer screening).   Review of Systems  Constitutional: Negative.   HENT: Negative.    Eyes: Negative.   Respiratory:  Negative for cough, chest tightness and shortness of breath.   Cardiovascular:  Negative for chest pain, palpitations and leg swelling.  Gastrointestinal:  Negative for abdominal distention, abdominal pain, constipation, diarrhea, nausea and vomiting.  Endocrine:       Low sugars  Musculoskeletal:  Positive for arthralgias and myalgias.  Skin: Negative.   Neurological: Negative.   Psychiatric/Behavioral: Negative.     Objective:  Physical Exam Constitutional:      Appearance: She is well-developed.     Comments: thin  HENT:     Head: Normocephalic and atraumatic.  Cardiovascular:     Rate and Rhythm: Normal rate and regular rhythm.  Pulmonary:     Effort: Pulmonary effort is normal. No respiratory distress.     Breath sounds: Normal breath sounds. No wheezing or rales.     Comments: Smelling of tobacco Abdominal:     General: Bowel sounds are normal. There is no distension.     Palpations: Abdomen is soft.     Tenderness: There is no abdominal tenderness. There is no rebound.  Musculoskeletal:        General: Tenderness present.     Cervical back: Normal range of motion.     Comments: Right shoulder AC tenderness and limitation of ROM  Skin:    General: Skin is warm and  dry.  Neurological:     Mental Status: She is alert and oriented to person, place, and time.     Coordination: Coordination normal.    Vitals:   12/16/20 1524  BP: 140/60  Pulse: 75  Temp: 99.3 F (37.4 C)  TempSrc: Oral  SpO2: 95%  Weight: 103 lb 3.2 oz (46.8 kg)  Height: 5\' 4"  (1.626 m)    This visit occurred during the SARS-CoV-2 public health emergency.  Safety protocols were in place, including screening questions prior to the visit, additional usage of staff PPE, and extensive cleaning of exam room while observing appropriate contact time as indicated for disinfecting solutions.   Assessment & Plan:

## 2020-12-16 NOTE — Patient Instructions (Addendum)
Get the x-ray today for the shoulder to check that.   Your HgA1c is 6.4 today which is stable from before.

## 2020-12-17 NOTE — Assessment & Plan Note (Signed)
Referral to lung cancer screening which she is open to and qualifies for.

## 2020-12-17 NOTE — Assessment & Plan Note (Signed)
Still having low sugars to 40s and monitors sugars. Reminded of need to have regular meals and snack prior to exercise.

## 2020-12-17 NOTE — Assessment & Plan Note (Signed)
Advised to get x-ray which was ordered and then we can move forward with treatment.

## 2020-12-17 NOTE — Assessment & Plan Note (Signed)
Seeing endocrine for management but HgA1c done here today which is lower than prior at 6.4. She is still having low sugars about the same frequency as prior. She will continue to follow with endocrine for adjustment of her insulin.

## 2020-12-22 ENCOUNTER — Other Ambulatory Visit: Payer: Self-pay

## 2020-12-22 ENCOUNTER — Ambulatory Visit: Payer: Medicare Other | Admitting: Family Medicine

## 2020-12-22 ENCOUNTER — Ambulatory Visit: Payer: Self-pay

## 2020-12-22 VITALS — BP 130/74 | HR 80 | Ht 64.0 in | Wt 106.4 lb

## 2020-12-22 DIAGNOSIS — M25511 Pain in right shoulder: Secondary | ICD-10-CM

## 2020-12-22 DIAGNOSIS — G8929 Other chronic pain: Secondary | ICD-10-CM | POA: Diagnosis not present

## 2020-12-22 NOTE — Progress Notes (Signed)
I, Peterson Lombard, LAT, ATC acting as a scribe for Lynne Leader, MD.  Leslie Lane is a 65 y.o. female who presents to [facility] today for R shoulder pain, chronic in nature, but worsening during the autumn. Pt was last seen by Dr. Georgina Snell on 09/02/20 for L 4th trigger finger. Today, pt locates pain to across [facility] joint w/ radiating pain into R humerus. Pt has a prior dx of fibromyalgia. Pt c/o increased pain when sleeping.  Neck pain: yes Radiates:  yes UE Numbness/tingling: no UE Weakness: yes- decreased AROM Aggravates: sleeping, driving Treatments tried: Tylenol,   Pt also c/o L leg pain, from thigh to lower leg. Pt c/o increased pain when squatting down to feed her dog ice cubes.  Dx imaging: 12/16/20 R shoulder XR  Pertinent review of systems: No fevers or chills  Relevant historical information: Diabetes   Exam:  BP 130/74 (BP Location: Right Arm, Patient Position: Sitting, Cuff Size: Normal)   Pulse 80   Ht 5\' 4"  (1.626 m)   Wt 106 lb 6.4 oz (48.3 kg)   SpO2 94%   BMI 18.26 kg/m  General: Well Developed, well nourished, and in no acute distress.   MSK: Right shoulder normal-appearing Nontender. Range of motion abduction 140 degrees internal rotation lumbar spine external rotation full. Strength intact throughout. Positive Hawkins and Neer's test. Positive empty can test. Negative Yergason's and speeds test.  Pulses capillary refill and sensation are intact distally    Lab and Radiology Results  Procedure: Real-time Ultrasound Guided Injection of right shoulder subacromial bursa Device: Philips Affiniti 50G Images permanently stored and available for review in PACS Ultrasound evaluation prior to injection reveals intact rotator cuff tendons.  Moderate subacromial bursitis.  Small amount of hypoechoic fluid tracks within biceps tendon sheath Verbal informed consent obtained.  Discussed risks and benefits of procedure. Warned about  infection bleeding damage to structures skin hypopigmentation and fat atrophy among others. Patient expresses understanding and agreement Time-out conducted.   Noted no overlying erythema, induration, or other signs of local infection.   Skin prepped in a sterile fashion.   Local anesthesia: Topical Ethyl chloride.   With sterile technique and under real time ultrasound guidance: 40 mg of Kenalog and 2 mL of Marcaine injected into subacromial bursa. Fluid seen entering the bursa.   Completed without difficulty   Pain immediately resolved suggesting accurate placement of the medication.   Advised to call if fevers/chills, erythema, induration, drainage, or persistent bleeding.   Images permanently stored and available for review in the ultrasound unit.  Impression: Technically successful ultrasound guided injection.        Assessment and Plan: 65 y.o. female with right shoulder pain predominantly due to subacromial bursitis and rotator cuff tendinitis.  Developing adhesive capsulitis is a possibility but less likely.  Plan for subacromial injection and home exercise program.  Caution patient about risk of hyperglycemia given her diabetes.  However diabetes is reasonably well controlled therefore risk is reasonable.  Plan for physical therapy as well.  Recheck in 6 weeks.   PDMP not reviewed this encounter. Orders Placed This Encounter  Procedures   [place] LIMITED JOINT SPACE STRUCTURES UP RIGHT(NO LINKED CHARGES)    Standing Status:   Future    Number of Occurrences:   1    Standing Expiration Date:   06/23/2021    Order Specific Question:   Reason for Exam (SYMPTOM  OR DIAGNOSIS REQUIRED)    Answer:  right shoulder pain    Order Specific Question:   Preferred imaging location?    Answer:   [place]   Ambulatory referral to Physical Therapy    Referral Priority:   Routine    Referral Type:   Physical Medicine    Referral Reason:   Specialty Services  Required    Requested Specialty:   Physical Therapy    Number of Visits Requested:   1   No orders of the defined types were placed in this encounter.    Discussed warning signs or symptoms. Please see discharge instructions. Patient expresses understanding.   The above documentation has been reviewed and is accurate and complete Lynne Leader, M.D.

## 2020-12-22 NOTE — Patient Instructions (Signed)
Thank you for coming in today.   Call or go to the ER if you develop a large red swollen joint with extreme pain or oozing puss.    I've referred you to Physical Therapy.  Let us know if you don't hear from them in one week.

## 2020-12-28 DIAGNOSIS — E1043 Type 1 diabetes mellitus with diabetic autonomic (poly)neuropathy: Secondary | ICD-10-CM | POA: Diagnosis not present

## 2020-12-28 DIAGNOSIS — Z794 Long term (current) use of insulin: Secondary | ICD-10-CM | POA: Diagnosis not present

## 2020-12-30 ENCOUNTER — Other Ambulatory Visit: Payer: Self-pay

## 2020-12-30 ENCOUNTER — Telehealth (INDEPENDENT_AMBULATORY_CARE_PROVIDER_SITE_OTHER): Payer: Medicare Other | Admitting: Psychiatry

## 2020-12-30 DIAGNOSIS — F4010 Social phobia, unspecified: Secondary | ICD-10-CM

## 2020-12-30 DIAGNOSIS — F332 Major depressive disorder, recurrent severe without psychotic features: Secondary | ICD-10-CM | POA: Diagnosis not present

## 2020-12-30 DIAGNOSIS — G4701 Insomnia due to medical condition: Secondary | ICD-10-CM

## 2020-12-30 MED ORDER — LAMOTRIGINE 100 MG PO TABS: 100.0000 mg | ORAL_TABLET | Freq: Two times a day (BID) | ORAL | 1 refills | Status: DC

## 2020-12-30 MED ORDER — TRAZODONE HCL 100 MG PO TABS: 400.0000 mg | ORAL_TABLET | Freq: Every day | ORAL | 1 refills | Status: DC

## 2020-12-30 MED ORDER — ESCITALOPRAM OXALATE 10 MG PO TABS: 10.0000 mg | ORAL_TABLET | Freq: Every day | ORAL | 1 refills | Status: DC

## 2020-12-30 NOTE — Progress Notes (Signed)
Virtual Visit via Telephone Note  I connected with Leslie Lane on 12/30/20 at 11:30 AM EDT by telephone and verified that I am speaking with the correct person using two identifiers.  Location: Patient: home Provider: office   I discussed the limitations, risks, security and privacy concerns of performing an evaluation and management service by telephone and the availability of in person appointments. I also discussed with the patient that there may be a patient responsible charge related to this service. The patient expressed understanding and agreed to proceed.   History of Present Illness: Leslie Lane states she has been ok. She has numerous stressors going on. She is very anxious and has been scratching herself and is getting sores. Leslie Lane worries over half of the day. She has racing thoughts and inability to relax. She is falling asleep with Trazodone 200-300mg . Despite that she wakes up multiple times a night. Her energy is generally low but doesn't nap during the day. She is always depressed. She doesn't want to eat but eats because she has to. She is endorsing anhedonia and crying spells. She is not cleaning up or doing chores around the house. Leslie Lane has frequent thoughts that she would be better off dead but denies SI/HI.    Observations/Objective:  General Appearance: unable to assess  Eye Contact:  unable to assess  Speech:  Clear and Coherent and Normal Rate  Volume:  Normal  Mood:  Anxious and Depressed  Affect:  Congruent  Thought Process:  Coherent, Linear, and Descriptions of Associations: Intact  Orientation:  Full (Time, Place, and Person)  Thought Content:  Logical  Suicidal Thoughts:  No  Homicidal Thoughts:  No  Memory:  Immediate;   Good  Judgement:  Fair  Insight:  Present  Psychomotor Activity: unable to assess  Concentration:  Concentration: Fair  Recall:  [place] of Knowledge:  Good  Language:  Good  Akathisia:  unable to assess  Handed:  Right  AIMS (if  indicated):     Assets:  Communication Skills Desire for Improvement Financial Resources/Insurance Housing Resilience Social Support Talents/Skills Transportation Vocational/Educational  ADL's:  unable to assess  Cognition:  WNL  Sleep:        Assessment and Plan: Depression screen [facility] 2/9 12/30/2020 12/16/2020 09/02/2020 06/10/2020 06/17/2019  Decreased Interest 3 3 2  0 1  Down, Depressed, Hopeless 3 3 2  0 1  PHQ - 2 Score 6 6 4  0 2  Altered sleeping 3 3 2  0 2  Tired, decreased energy 3 2 3  0 3  Change in appetite 3 1 3  0 0  Feeling bad or failure about yourself  3 3 2  0 3  Trouble concentrating 3 3 3  0 3  Moving slowly or fidgety/restless 0 1 1 0 3  Suicidal thoughts 3 2 0 0 1  PHQ-9 Score 24 21 18  0 17  Difficult doing work/chores Extremely dIfficult - Extremely dIfficult - -  Some recent data might be hidden   - increase Trazodone to 400mg  qHS - start Lexapro 10mg  po qD as she has had a good response in the past.  1. Major depressive disorder, recurrent, severe without psychotic features (HCC) - lamoTRIgine (LAMICTAL) 100 MG tablet; Take 1 tablet (100 mg total) by mouth 2 (two) times daily.  Dispense: 60 tablet; Refill: 1 - traZODone (DESYREL) 100 MG tablet; Take 4 tablets (400 mg total) by mouth at bedtime.  Dispense: 120 tablet; Refill: 1 - escitalopram (LEXAPRO) 10 MG tablet; Take 1 tablet (  10 mg total) by mouth daily.  Dispense: 30 tablet; Refill: 1  2. Social anxiety disorder - lamoTRIgine (LAMICTAL) 100 MG tablet; Take 1 tablet (100 mg total) by mouth 2 (two) times daily.  Dispense: 60 tablet; Refill: 1 - escitalopram (LEXAPRO) 10 MG tablet; Take 1 tablet (10 mg total) by mouth daily.  Dispense: 30 tablet; Refill: 1  3. Insomnia due to medical condition - traZODone (DESYREL) 100 MG tablet; Take 4 tablets (400 mg total) by mouth at bedtime.  Dispense: 120 tablet; Refill: 1   Follow Up Instructions: In 1-2 months or sooner if needed   I discussed the assessment  and treatment plan with the patient. The patient was provided an opportunity to ask questions and all were answered. The patient agreed with the plan and demonstrated an understanding of the instructions.   The patient was advised to call back or seek an in-person evaluation if the symptoms worsen or if the condition fails to improve as anticipated.  I provided 15 minutes of non-face-to-face time during this encounter.   Charlcie Cradle, MD

## 2021-01-04 ENCOUNTER — Telehealth: Payer: Self-pay | Admitting: Internal Medicine

## 2021-01-04 MED ORDER — LOVASTATIN 40 MG PO TABS: 40.0000 mg | ORAL_TABLET | Freq: Every day | ORAL | 2 refills | Status: DC

## 2021-01-04 MED ORDER — GABAPENTIN 100 MG PO CAPS: ORAL_CAPSULE | ORAL | 0 refills | Status: DC

## 2021-01-04 NOTE — Telephone Encounter (Signed)
Medication has been sent to the patient's pharmacy.  

## 2021-01-04 NOTE — Telephone Encounter (Signed)
1.Medication Requested: gabapentin (NEURONTIN) 100 MG capsule  lovastatin (MEVACOR) 40 MG tablet   2. Pharmacy (Name, Street, [place]): [facility] - [place], [place]  3. On Med List: yes   4. Last Visit with PCP: 12-16-20  5. Next visit date with PCP: 06-17-21   Agent: Please be advised that RX refills may take up to 3 business days. We ask that you follow-up with your pharmacy.

## 2021-01-06 ENCOUNTER — Telehealth: Payer: Medicare Other

## 2021-01-07 DIAGNOSIS — H524 Presbyopia: Secondary | ICD-10-CM | POA: Diagnosis not present

## 2021-01-07 DIAGNOSIS — H5203 Hypermetropia, bilateral: Secondary | ICD-10-CM | POA: Diagnosis not present

## 2021-01-07 DIAGNOSIS — E119 Type 2 diabetes mellitus without complications: Secondary | ICD-10-CM | POA: Diagnosis not present

## 2021-01-07 LAB — HM DIABETES EYE EXAM

## 2021-01-14 ENCOUNTER — Other Ambulatory Visit: Payer: Self-pay | Admitting: Endocrinology

## 2021-01-25 ENCOUNTER — Encounter: Payer: Self-pay | Admitting: Pulmonary Disease

## 2021-01-25 ENCOUNTER — Ambulatory Visit (INDEPENDENT_AMBULATORY_CARE_PROVIDER_SITE_OTHER): Payer: Medicare Other | Admitting: Pulmonary Disease

## 2021-01-25 ENCOUNTER — Other Ambulatory Visit: Payer: Self-pay

## 2021-01-25 ENCOUNTER — Ambulatory Visit (INDEPENDENT_AMBULATORY_CARE_PROVIDER_SITE_OTHER): Payer: Medicare Other

## 2021-01-25 VITALS — BP 122/60 | HR 68 | Ht 63.0 in | Wt 100.6 lb

## 2021-01-25 DIAGNOSIS — Z720 Tobacco use: Secondary | ICD-10-CM | POA: Diagnosis not present

## 2021-01-25 DIAGNOSIS — R053 Chronic cough: Secondary | ICD-10-CM

## 2021-01-25 DIAGNOSIS — R059 Cough, unspecified: Secondary | ICD-10-CM | POA: Diagnosis not present

## 2021-01-25 MED ORDER — ANORO ELLIPTA 62.5-25 MCG/INH IN AEPB: 1.0000 | INHALATION_SPRAY | Freq: Every day | RESPIRATORY_TRACT | 11 refills | Status: DC

## 2021-01-25 NOTE — Patient Instructions (Addendum)
Nice to meet you  We we will get a chest x-ray today.  Use anoro once daily for the cough  If too expensive ask he pharmacist what your plan would prefer. If they are not helpful, call your insurance company, [employer] and ask what the preferred option is if Anoro is too expensive.   If Anoro or another similar drug is too expensive, then we can fill paperwork to get the drug company to help with the cost.   Return to clinic in 6 months or sooner with Dr. Silas Flood.  Appointment next available with Eric Form, NP for lung cancer screening.

## 2021-01-25 NOTE — Progress Notes (Signed)
@Patient  ID: Leslie Lane, female    DOB: 25-Mar-1956, 65 y.o.   MRN: [id number]  Chief Complaint  Patient presents with   Consult    Smokers cough, coughing up mucus times 3 years. Smoking makes it worse.    Referring provider: [name], *  HPI:   65 year old woman whom we are seeing in consultation for evaluation of chronic cough, lung cancer screening evaluation.  PCP note x2 reviewed.  Patient feels okay.  Denies any dyspnea on exertion.  Does have a cough for about 3 to 4 years.  Daily cough, worse in the morning.  Productive of sputum.  No clear alleviating or exacerbating factors.  No environmental or seasonal changes make things better or worse.  No position that make things better or worse.  She has a history of GERD but symptoms well controlled on PPI.  She denies seasonal allergies, rhinorrhea, nasal congestion.  No recent weight loss.  No chest imaging since 2019.  Chest x-ray 2019 reviewed interpreted as clear lungs, hyperinflation.  Current everyday smoker.  About a pack and a half a day.  Precontemplative in terms of quitting.  Discussed risk and benefits of smoking cessation at length.  Introduced idea of lung cancer screening.  She is interested in further discussion.  PMH: Tobacco abuse, diabetes, Surgical history: C-section, colonoscopy, tubal ligation Family history: Mother with arthritis, father with arthritis, kidney disease, brother with CAD Social history: Current smoker, approximately 75-pack-year, lives in [place] / Pulmonary Flowsheets:   ACT:  No flowsheet data found.  MMRC: No flowsheet data found.  Epworth:  No flowsheet data found.  Tests:   FENO:  No results found for: NITRICOXIDE  PFT: No flowsheet data found.  WALK:  No flowsheet data found.  Imaging: Personally reviewed as per EMR discussion this note  Lab Results: Personally reviewed CBC    Component Value Date/Time   WBC 4.4 06/10/2020 1343    RBC 4.37 06/10/2020 1343   HGB 12.0 06/10/2020 1343   HCT 36.7 06/10/2020 1343   PLT 237.0 06/10/2020 1343   MCV 84.2 06/10/2020 1343   MCH 26.0 05/02/2015 2214   MCHC 32.5 06/10/2020 1343   RDW 16.5 (H) 06/10/2020 1343   LYMPHSABS 1.8 03/20/2017 1109   MONOABS 0.4 03/20/2017 1109   EOSABS 0.1 03/20/2017 1109   BASOSABS 0.1 03/20/2017 1109    BMET    Component Value Date/Time   NA 141 02/28/2021 1054   K 4.2 02/28/2021 1054   CL 102 02/28/2021 1054   CO2 32 02/28/2021 1054   GLUCOSE 88 02/28/2021 1054   GLUCOSE 88 02/28/2021 1054   BUN 19 02/28/2021 1054   CREATININE 0.79 02/28/2021 1054   CALCIUM 9.4 02/28/2021 1054   GFRNONAA >60 05/02/2015 2214   GFRAA >60 05/02/2015 2214    BNP No results found for: BNP  ProBNP No results found for: PROBNP  Specialty Problems       Pulmonary Problems   Smokers' cough ([place])    Annotation: opiate and cocaine abuse Qualifier: Diagnosis of  By: Asa Lente MD, Valerie A        Allergies  Allergen Reactions   Penicillins Anaphylaxis   Sulfonamide Derivatives Anaphylaxis    Immunization History  Administered Date(s) Administered   Fluad Quad(high Dose 65+) 04/24/2019, 04/20/2020   Influenza Split 03/22/2011, 03/26/2012   Influenza,inj,Quad PF,6+ Mos 02/28/2013, 04/13/2014, 05/13/2015, 02/28/2016, 03/26/2017, 06/04/2018   PFIZER(Purple Top)SARS-COV-2 Vaccination 10/01/2019, 10/29/2019, 06/21/2020   Pneumococcal Conjugate-13 07/24/2016  Pneumococcal Polysaccharide-23 07/31/2013, 06/17/2019   Td 07/28/2011   Zoster, Live 05/31/2016    Past Medical History:  Diagnosis Date   Abdominal pain 02/28/2016   Abnormal CT scan, bladder    Noted 07/28/13 CT - s/p uro eval 08/2013 [place] -    Acquired trigger finger of left ring finger 02/18/2019   Injected February 18, 2019 and March 2022   Allergic rhinitis    Anemia    Arthritis    Bipolar disorder 07/05/2010   Cataract    Chronic lower back pain 07/05/2010   Chronic  neck pain    Diabetic gastroparesis associated with type 1 diabetes mellitus 10/24/2016   Fibromyalgia    Generalized anxiety disorder 04/27/2014   GERD (gastroesophageal reflux disease) 07/05/2010   Hepatitis B virus infection 07/05/2010   History of substance abuse    opiates; cocaine   Hypoglycemia unawareness associated with type 1 diabetes mellitus 11/23/2020   Insomnia 04/13/2015   Major depressive disorder 07/28/2014   Migraine headache 07/05/2010   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  It hasn't happened in a long time."   Smokers' cough 07/05/2010   Type 1 diabetes mellitus with complication 29/79/8921   WBC decreased 03/27/2017    Tobacco History: Social History   Tobacco Use  Smoking Status Every Day   Packs/day: 1.50   Years: 50.00   Pack years: 75.00   Types: Cigarettes  Smokeless Tobacco Never   Ready to quit: Not Answered Counseling given: Not Answered   Continue to not smoke  Outpatient Encounter Medications as of 01/25/2021  Medication Sig   umeclidinium-vilanterol (ANORO ELLIPTA) 62.5-25 MCG/INH AEPB Inhale 1 puff into the lungs daily.   Accu-Chek Softclix Lancets lancets Use as instructed to check blood sugar 4 times per day dx code E10.65   Alcohol Swabs (B-D SINGLE USE SWABS REGULAR) PADS USE FOUR TIMES DAILY   aspirin EC 81 MG tablet Take 81 mg by mouth every morning.   Blood Glucose Calibration (ACCU-CHEK AVIVA) SOLN USE AS DIRECTED   Blood Glucose Monitoring Suppl (ACCU-CHEK AVIVA PLUS) w/Device KIT USE AS DIRECTED   diclofenac Sodium (VOLTAREN) 1 % GEL APPLY 2 GRAMS EXTERNALLY TO THE AFFECTED AREA FOUR TIMES DAILY   donepezil (ARICEPT) 10 MG tablet Take 1 tablet every  night   gabapentin (NEURONTIN) 100 MG capsule Take 1 to 2 capsules by mouth twice daily.   insulin detemir (LEVEMIR FLEXTOUCH) 100 UNIT/ML FlexPen Inject 3 units in the morning and 6 units at bedtime   lidocaine (LIDODERM) 5 % UNWRAP AND APPLY 1 PATCH TO SKIN EVERY DAY(REMOVE AND DISCARD PATCH WITHIN 12 HOURS OR AS DIRECTED BY DOCTOR)   lipase/protease/amylase (CREON) 36000 UNITS CPEP capsule Take 2 capsules (72,000 Units total) by mouth 3 (three) times daily before meals.   loratadine (CLARITIN) 10 MG tablet Take 10 mg by mouth as needed for allergies.   lovastatin (MEVACOR) 40 MG tablet Take 1 tablet (40 mg total) by mouth at bedtime.   Magnesium 500 MG TABS Take 1 tablet by mouth as needed.   metoCLOPramide (REGLAN) 5 MG tablet TAKE 1 TABLET THREE TIMES DAILY BEFORE MEALS AS NEEDED   Multiple Vitamin (MULTIVITAMIN) tablet Take 1 tablet by mouth daily.  Reported on 11/22/2015   Omega-3 Fatty Acids (FISH OIL) 1000 MG CAPS Take 1 capsule by mouth daily.   omeprazole (PRILOSEC) 40 MG capsule TAKE ONE CAPSULE BY MOUTH ONCE DAILY   traZODone (DESYREL) 100 MG tablet Take 4 tablets (400 mg total) by mouth at bedtime.   vitamin B-12 (CYANOCOBALAMIN) 1000 MCG tablet Take 1 tablet by mouth daily.   vitamin E 1000 UNIT capsule Take 1,000 Units by mouth daily.   [DISCONTINUED] escitalopram (LEXAPRO) 10 MG tablet Take 1 tablet (10 mg total) by mouth daily.   [DISCONTINUED] glucose blood (ACCU-CHEK AVIVA PLUS) test strip TEST BLOOD SUGAR FOUR TIMES DAILY   [DISCONTINUED] GVOKE HYPOPEN 2-PACK 1 MG/0.2ML SOAJ INJECT 1 MG INTO THE SKIN AS NEEDED FOR HYPOGLYCEMIA   [DISCONTINUED] insulin lispro (HUMALOG KWIKPEN) 100 UNIT/ML KwikPen inject 10 UNITS UNDER THE SKIN EVERY MORNING AND inject EIGHT UNITS EVERY EVENING PER sliding scale. inject 1-3 UNITS AS NEEDED FOR high blood sugars (Patient taking differently: 2-4 Units. inject 10 UNITS UNDER THE SKIN EVERY MORNING AND inject EIGHT UNITS EVERY EVENING PER sliding  scale. inject 1-3 UNITS AS NEEDED FOR high blood sugars)   [DISCONTINUED] Insulin Pen Needle 31G X 8 MM MISC Use 4 pen needles per day to inject Insulin   [DISCONTINUED] lamoTRIgine (LAMICTAL) 100 MG tablet Take 1 tablet (100 mg total) by mouth 2 (two) times daily.   No facility-administered encounter medications on file as of 01/25/2021.     Review of Systems  Review of Systems  No chest pain exertion, no orthopnea or PND.  Comprehensive review of systems otherwise negative. Physical Exam  BP 122/60   Pulse 68   Ht 5' 3"  (1.6 m)   Wt 100 lb 9.6 oz (45.6 kg)   SpO2 96%   BMI 17.82 kg/m   Wt Readings from Last 5 Encounters:  03/21/21 101 lb 8 oz (46 kg)  03/03/21 102 lb 12.8 oz (46.6 kg)  01/25/21 100 lb 9.6 oz (45.6 kg)  12/22/20 106 lb 6.4 oz (48.3 kg)  12/16/20 103 lb 3.2 oz (46.8 kg)    BMI Readings from Last 5 Encounters:  03/21/21 17.70 kg/m  03/03/21 17.92 kg/m  01/25/21 17.82 kg/m  12/22/20 18.26 kg/m  12/16/20 17.71 kg/m     Physical Exam General: Well-appearing, no acute distress Eyes: EOMI, icterus Neck: Supple, no JVP Pulmonary: Clear, normal work of breathing Cardiovascular: Regular rate and rhythm, no murmur Abdomen: Nondistended, bowel sounds present MSK: No synovitis, no joint effusion Neuro: Normal gait, no weakness Psych: Normal mood, full affect   Assessment & Plan:   Chronic bronchitis: Daily mucus production for about 3 years.  Related to cigarette smoking.  Trial of Anoro to see if helps.  Chest x-ray today.  Lung cancer screening: She qualifies based on age, smoking history.  Discussed the rationale and role of lung cancer screening.  She is interested in an exploring more.  Referral to lung cancer screening clinic.   Return in about 6 months (around 07/28/2021).   Lanier Clam, MD 03/29/2021

## 2021-01-26 ENCOUNTER — Ambulatory Visit: Payer: Medicare Other | Admitting: Pharmacist

## 2021-01-26 NOTE — Progress Notes (Unsigned)
Chronic Care Management Pharmacy Note  01/26/2021 Name:  Leslie Lane MRN:  [id number] DOB:  Jun 06, 1956  Summary: ***  Recommendations/Changes made from today's visit: ***  Plan: ***   Subjective: Leslie Lane is an 65 y.o. year old female who is a primary patient of [name], MD.  The CCM team was consulted for assistance with disease management and care coordination needs.    Engaged with patient by telephone for follow up visit in response to provider referral for pharmacy case management and/or care coordination services.   Consent to Services:  The patient was given information about Chronic Care Management services, agreed to services, and gave verbal consent prior to initiation of services.  Please see initial visit note for detailed documentation.   Patient Care Team: [name], MD as PCP - General (Internal Medicine) [name], MD as Consulting Physician (Endocrinology) Amil Amen, MD as Consulting Physician (Psychiatry) Ricard Dillon, MD as Consulting Physician (Psychiatry) Ladene Artist, MD (Gastroenterology) Franchot Gallo, MD (Urology) Cameron Sprang, MD as Consulting Physician (Neurology) Charlton Haws, [facility] as Pharmacist (Pharmacist)  Recent office visits: 12/16/20 Dr Sharlet Salina OV: f/u for shoulder pain, DM. No med changes.  Recent consult visits: 01/25/21 Dr Silas Flood (pulmonary): f/u chronic cough, tobacco use. Rx'd Anoro Ellipta. Ordered Lung cancer screening.  12/30/20 Dr Doyne Keel (psych): f/u MDD. Started escitalopram 10 mg. Increase trazodone to 400 mg HS.  12/22/20 Dr Georgina Snell (sports med): f/u shoulder pain. Steroid injection given.  12/03/20 Dr Delice Lesch (neurology): f/u cognitive impairment. Refilled donepezil. Referred for neuropsych testing  11/23/20 Dr Dwyane Dee (endocrine): advised no Humalog at supper. Only take Humalog if sugar > 200 and eating > 2 starches  Hospital visits: None in previous 6  months   Objective:  Lab Results  Component Value Date   CREATININE 0.77 09/16/2020   BUN 24 (H) 09/16/2020   GFR 80.39 09/16/2020   GFRNONAA >60 05/02/2015   GFRAA >60 05/02/2015   NA 138 09/16/2020   K 3.9 09/16/2020   CALCIUM 9.8 09/16/2020   CO2 32 09/16/2020   GLUCOSE 108 (H) 09/16/2020    Lab Results  Component Value Date/Time   HGBA1C 6.4 (A) 12/16/2020 03:48 PM   HGBA1C 6.8 (H) 09/16/2020 10:36 AM   HGBA1C 6.1 06/10/2020 01:43 PM   FRUCTOSAMINE 322 (H) 10/11/2017 09:24 AM   FRUCTOSAMINE 296 (H) 01/11/2016 10:16 AM   GFR 80.39 09/16/2020 10:36 AM   GFR 80.54 06/10/2020 01:43 PM   MICROALBUR 1.0 06/10/2020 01:43 PM   MICROALBUR 1.9 09/16/2019 10:28 AM    Last diabetic Eye exam:  Lab Results  Component Value Date/Time   HMDIABEYEEXA No Retinopathy 01/07/2021 09:09 AM    Last diabetic Foot exam: No results found for: HMDIABFOOTEX   Lab Results  Component Value Date   CHOL 181 09/16/2020   HDL 84.40 09/16/2020   LDLCALC 84 09/16/2020   LDLDIRECT 101.7 03/26/2012   TRIG 64.0 09/16/2020   CHOLHDL 2 09/16/2020    Hepatic Function Latest Ref Rng & Units 09/16/2020 06/10/2020 09/16/2019  Total Protein 6.0 - 8.3 g/dL 6.9 6.9 6.6  Albumin 3.5 - 5.2 g/dL 4.2 4.3 4.0  AST 0 - 37 U/L _0 ALT 0 - 35 U/L _1 Alk Phosphatase 39 - 117 U/L 46 44 49  Total Bilirubin 0.2 - 1.2 mg/dL 0.5 0.5 0.5  Bilirubin, Direct 0.0 - 0.3 mg/dL - - -    Lab Results  Component  Value Date/Time   TSH 1.78 06/13/2018 09:57 AM   TSH 2.59 06/04/2018 10:39 AM   FREET4 0.84 02/06/2013 11:50 AM    CBC Latest Ref Rng & Units 06/10/2020 06/04/2018 03/20/2017  WBC 4.0 - 10.5 K/uL 4.4 3.8(L) 3.3(L)  Hemoglobin 12.0 - 15.0 g/dL 12.0 12.9 13.8  Hematocrit 36.0 - 46.0 % 36.7 38.5 41.5  Platelets 150.0 - 400.0 K/uL 237.0 259.0 213.0    Lab Results  Component Value Date/Time   VD25OH 38.26 06/04/2018 10:39 AM   VD25OH 50.94 05/31/2016 11:25 AM    Clinical ASCVD: No  The 10-year  ASCVD risk score Mikey Bussing DC Jr., et al., 2013) is: 15.3%   Values used to calculate the score:     Age: 27 years     Sex: Female     Is Non-Hispanic African American: No     Diabetic: Yes     Tobacco smoker: Yes     Systolic Blood Pressure: 224 mmHg     Is BP treated: No     HDL Cholesterol: 84.4 mg/dL     Total Cholesterol: 181 mg/dL    Depression screen [facility] 2/9 12/30/2020 12/16/2020 09/02/2020  Decreased Interest _0 Down, Depressed, Hopeless _1 PHQ - 2 Score _2 Altered sleeping _3 Tired, decreased energy _4 Change in appetite _5 Feeling bad or failure about yourself  _6 Trouble concentrating _7 Moving slowly or fidgety/restless 0 1 1  Suicidal thoughts 3 2 0  PHQ-9 Score _8 Difficult doing work/chores Extremely dIfficult - Extremely dIfficult  Some recent data might be hidden     Social History   Tobacco Use  Smoking Status Every Day   Packs/day: 2.00   Years: 40.00   Pack years: 80.00   Types: Cigarettes  Smokeless Tobacco Never   BP Readings from Last 3 Encounters:  01/25/21 122/60  12/22/20 130/74  12/16/20 140/60   Pulse Readings from Last 3 Encounters:  01/25/21 68  12/22/20 80  12/16/20 75   Wt Readings from Last 3 Encounters:  01/25/21 100 lb 9.6 oz (45.6 kg)  12/22/20 106 lb 6.4 oz (48.3 kg)  12/16/20 103 lb 3.2 oz (46.8 kg)   BMI Readings from Last 3 Encounters:  01/25/21 17.82 kg/m  12/22/20 18.26 kg/m  12/16/20 17.71 kg/m    Assessment/Interventions: Review of patient past medical history, allergies, medications, health status, including review of consultants reports, laboratory and other test data, was performed as part of comprehensive evaluation and provision of chronic care management services.   SDOH:  (Social Determinants of Health) assessments and interventions performed: Yes  SDOH Screenings   Alcohol Screen: Not on file  Depression (PHQ2-9): Medium Risk   PHQ-2 Score: 24  Financial Resource  Strain: Not on file  Food Insecurity: Not on file  Housing: Not on file  Physical Activity: Not on file  Social Connections: Not on file  Stress: Not on file  Tobacco Use: High Risk   Smoking Tobacco Use: Every Day   Smokeless Tobacco Use: Never  Transportation Needs: Not on file    CCM Care Plan  Allergies  Allergen Reactions   Penicillins Anaphylaxis   Sulfonamide Derivatives Anaphylaxis    Medications Reviewed Today     Reviewed by Lanier Clam, MD (Physician) on 01/25/21 at 1534  Med List Status: <None>   Medication Order Taking? Sig  Documenting Provider Last Dose Status Informant  Accu-Chek Softclix Lancets lancets [id number] No Use as instructed to check blood sugar 4 times per day dx code E10.65 [name], MD Taking Active            Med Note Lesly Rubenstein Dec 16, 2020  3:28 PM) use  Alcohol Swabs (B-D SINGLE USE SWABS REGULAR) PADS [id number] No USE FOUR TIMES DAILY [name], MD Taking Active            Med Note Alfredia Ferguson A   Thu Dec 16, 2020  3:28 PM) use  aspirin EC 81 MG tablet [id number] No Take 81 mg by mouth every morning. [provider] Taking Active Self  Blood Glucose Calibration (ACCU-CHEK AVIVA) SOLN [id number] No USE AS DIRECTED [name], MD Taking Active            Med Note Alfredia Ferguson A   Thu Dec 16, 2020  3:29 PM) use  Blood Glucose Monitoring Suppl (ACCU-CHEK AVIVA PLUS) w/Device KIT [id number] No USE AS DIRECTED [name], MD Taking Active            Med Note Lesly Rubenstein Dec 16, 2020  3:29 PM) use  diclofenac Sodium (VOLTAREN) 1 % GEL [id number] No APPLY 2 GRAMS EXTERNALLY TO THE AFFECTED AREA FOUR TIMES DAILY [name], MD Taking Active   donepezil (ARICEPT) 10 MG tablet [id number] No Take 1 tablet every night Cameron Sprang, MD Taking Active   escitalopram (LEXAPRO) 10 MG tablet [id number]  Take 1 tablet (10 mg total) by mouth daily. Charlcie Cradle, MD  Active   gabapentin (NEURONTIN)  100 MG capsule [id number]  Take 1 to 2 capsules by mouth twice daily. [name], MD  Active   glucose blood (ACCU-CHEK AVIVA PLUS) test strip [id number] No TEST BLOOD SUGAR FOUR TIMES DAILY [name], MD Taking Active            Med Note Alfredia Ferguson A   Thu Dec 16, 2020  3:29 PM) use  GVOKE HYPOPEN 2-PACK 1 MG/0.2ML SOAJ [id number]  INJECT 1 MG INTO THE SKIN AS NEEDED FOR HYPOGLYCEMIA [name], MD  Active   insulin detemir (LEVEMIR FLEXTOUCH) 100 UNIT/ML FlexPen [id number] No Inject 3 units in the morning and 6 units at bedtime [name], MD Taking Active   insulin lispro (HUMALOG KWIKPEN) 100 UNIT/ML KwikPen [id number] No inject 10 UNITS UNDER THE SKIN EVERY MORNING AND inject EIGHT UNITS EVERY EVENING PER sliding scale. inject 1-3 UNITS AS NEEDED FOR high blood sugars  Patient taking differently: 2-4 Units. inject 10 UNITS UNDER THE SKIN EVERY MORNING AND inject EIGHT UNITS EVERY EVENING PER sliding scale. inject 1-3 UNITS AS NEEDED FOR high blood sugars   [name], MD Taking Active            Med Note [place]on Gully, [facility] K   Mon Sep 20, 2020  2:57 PM) Pt doses herself by what she eats and blood sugar reading  Insulin Pen Needle 31G X 8 MM MISC [id number] No Use 4 pen needles per day to inject Insulin [name], MD Taking Active            Med Note Blanch Media, CYNTHIA A   Thu Dec 16, 2020  3:29 PM) use  lamoTRIgine (LAMICTAL) 100 MG tablet [id number]  Take 1 tablet (100 mg total) by mouth 2 (two) times daily. Charlcie Cradle, MD  Active   lidocaine (LIDODERM) 5 %  [id number] No UNWRAP AND APPLY 1 PATCH TO SKIN EVERY DAY(REMOVE AND DISCARD PATCH WITHIN 12 HOURS OR AS DIRECTED BY DOCTOR) [name], MD Taking Active   lipase/protease/amylase (CREON) 36000 UNITS CPEP capsule [id number] No Take 2 capsules (72,000 Units total) by mouth 3 (three) times daily before meals. [name], MD Taking Active   loratadine (CLARITIN) 10 MG tablet [id number] No Take 10 mg by  mouth as needed for allergies. [provider] Taking Active Self  lovastatin (MEVACOR) 40 MG tablet [id number]  Take 1 tablet (40 mg total) by mouth at bedtime. [name], MD  Active   Magnesium 500 MG TABS [id number] No Take 1 tablet by mouth as needed. [provider] Taking Active   metoCLOPramide (REGLAN) 5 MG tablet [id number] No TAKE 1 TABLET THREE TIMES DAILY BEFORE MEALS AS NEEDED [name], MD Taking Active   Multiple Vitamin (MULTIVITAMIN) tablet [id number] No Take 1 tablet by mouth daily. Reported on 11/22/2015 [provider] Taking Active Self           Med Note Driscilla Grammes Sep 20, 2020  2:59 PM) Taking gummy vitamins  Omega-3 Fatty Acids (FISH OIL) 1000 MG CAPS [id number] No Take 1 capsule by mouth daily. [provider] Taking Active   omeprazole (PRILOSEC) 40 MG capsule [id number] No TAKE ONE CAPSULE BY MOUTH ONCE DAILY [name], MD Taking Active   traZODone (DESYREL) 100 MG tablet [id number]  Take 4 tablets (400 mg total) by mouth at bedtime. Charlcie Cradle, MD  Active   umeclidinium-vilanterol [facility] ELLIPTA) 62.5-25 MCG/INH AEPB [id number] Yes Inhale 1 puff into the lungs daily. Hunsucker, Bonna Gains, MD  Active   vitamin B-12 (CYANOCOBALAMIN) 1000 MCG tablet [id number] No Take 1 tablet by mouth daily. [provider] Taking Active   vitamin E 1000 UNIT capsule [id number] No Take 1,000 Units by mouth daily. [provider] Taking Active Self            Patient Active Problem List   Diagnosis Date Noted   Hypoglycemia unawareness associated with type 1 diabetes mellitus ([place]) 11/23/2020   Intractable acute post-traumatic headache 10/01/2020   Acquired trigger finger of left ring finger 02/18/2019   Right shoulder pain 02/18/2018   Protein calorie malnutrition ([place]) 06/01/2017   Routine general medical examination at a health care facility 06/01/2017   WBC decreased 03/27/2017   Diabetic  gastroparesis associated with type 1 diabetes mellitus ([place]) 10/24/2016   Abdominal pain 02/28/2016   Insomnia 04/13/2015   Major depressive disorder, recurrent, severe without psychotic features ([place]) 07/28/2014   Generalized anxiety disorder 04/27/2014   Abnormal CT scan, bladder    Type 1 diabetes mellitus with complication ([place]) 56/70/1410   HEPATITIS B 07/05/2010   Mixed diabetic hyperlipidemia associated with type 1 diabetes mellitus ([place]) 07/05/2010   BIPOLAR AFFECTIVE DISORDER 07/05/2010   Smokers' cough ([place]) 07/05/2010   MIGRAINE HEADACHE 07/05/2010   GERD 07/05/2010   LOW BACK PAIN, CHRONIC 07/05/2010    Immunization History  Administered Date(s) Administered   Fluad Quad(high Dose 65+) 04/24/2019, 04/20/2020   Influenza Split 03/22/2011, 03/26/2012   Influenza,inj,Quad PF,6+ Mos 02/28/2013, 04/13/2014, 05/13/2015, 02/28/2016, 03/26/2017, 06/04/2018   PFIZER(Purple Top)SARS-COV-2 Vaccination 10/01/2019, 10/29/2019, 06/21/2020   Pneumococcal Conjugate-13 07/24/2016   Pneumococcal Polysaccharide-23 07/31/2013, 06/17/2019   Td 07/28/2011   Zoster, Live 05/31/2016    Conditions to be addressed/monitored:  Hypertension, Hyperlipidemia, Diabetes, and Depression  There are no care plans that you recently  modified to display for this patient.    Medication Assistance: {MEDASSISTANCEINFO:25044}  Compliance/Adherence/Medication fill history: Care Gaps: Shingrix Covid booster (due 10/20/20)  Star-Rating Drugs: Lovastatin - LF 01/05/21 x 90 ds  Patient's preferred pharmacy is:  [id number] [address], [address] [place] [address] Phone: [phone] Fax: [phone]  [facility]  ([place] - [address], [place] [address] [address] Phone: [phone] Fax: [phone]  Uses pill box? {Yes or If no,  why not?:20788} Pt endorses ***% compliance  We discussed: {Pharmacy options:24294} Patient decided to: {US Pharmacy Plan:23885}  Care Plan and Follow Up Patient Decision:  {FOLLOWUP:24991}  Plan: {CM FOLLOW UP QHUT:65465}  ***    Current Barriers:  {pharmacybarriers:24917}  Pharmacist Clinical Goal(s):  Patient will {PHARMACYGOALCHOICES:24921} through collaboration with PharmD and provider.   Interventions: 1:1 collaboration with [name], MD regarding development and update of comprehensive plan of care as evidenced by provider attestation and co-signature Inter-disciplinary care team collaboration (see longitudinal plan of care) Comprehensive medication review performed; medication list updated in electronic medical record   Diabetes (Type 1)    A1c goal < 7% without hypoglycemia Checking BG: via Freestyle Libre 2. BG range: "Up and down"   Patient has failed these meds in past: Lantus, Toujeo, Patient is currently controlled on the following medications: Levemir  - 3 units AM and 6 units HS Novolog/Humalog 1-3 units TID w/ meals Gvoke Hypopen PRN Freestyle Libre   We discussed: Pt reports she has not needed to use GVOKE in a "while"; she has an excess supply of Novolog and is using that up before switching to Humalog.    Plan: Continue current medications and control with diet and exercise    Hyperlipidemia    LDL goal < 100  Patient has failed these meds in past: simvastatin Patient is currently controlled on the following medications: aspirin 81 mg daily, fish oil OTC Lovastatin 40 mg daily   We discussed:  Pt had previously wanted to stop statin because she heard statins are "bad for the brain", discussed benefits of statins for MI/stroke prevention, consequences of stroke, pt agreed statin benefit outweighs risk and will continue to take   Plan: Continue current medication   Tobacco Abuse   Patient smokes Within 30 minutes of waking   reports MOTIVATION to quit is high  reports CONFIDENCE in quitting is low "I have no will power"   Previous quit attempts: tried Nicotine patch, Chantix (weird dreams), bupropion Did quit for 2 weeks before cataract surgery once, felt very irritable and on-edge the whole time.  We discussed: pt did not tolerate bupropion (see above); Provided contact information for [place] [facility]uit [facility] (1-800-QUIT-NOW) and encouraged patient to reach out to this group for support. Pt has not yet called the Quit Line, reiterated benefits of smoking cessation and pt agreed to call.   Plan: Contact [place] [facility]uit [facility] for counseling    Bipolar depression / anxiety / insomnia   Patient has failed these meds in past: bupropion, buspar, sertraline, paroxetine, venlafaxine, Trintellix Patient is currently uncontrolled on the following medications: Trazodone 100 mg - 4 tab HS Escitalopram 10 mg daily   We discussed:  Pt still reports fatigue throughout the day; she reports exercise/movement does give her more energy; advised she make exercise a part of her daily routine,  even just walking for a few minutes a day  Mirtazapine?   Plan: Continue current medications and control with diet and exercise  Chronic cough   Patient has failed these meds in past: *** Patient is currently {CHL Controlled/Uncontrolled:[phone]} on the following medications:  Anoro Ellipta 1 puff daily  We discussed:  ***  Plan: Continue {CHL [facility] RJPVG:[id number]}  Patient Goals/Self-Care Activities Patient will:  - {pharmacypatientgoals:24919}

## 2021-01-27 ENCOUNTER — Telehealth: Payer: Self-pay | Admitting: Internal Medicine

## 2021-01-27 ENCOUNTER — Encounter: Payer: Medicare Other | Admitting: Family Medicine

## 2021-01-27 ENCOUNTER — Telehealth: Payer: Self-pay

## 2021-01-27 NOTE — Telephone Encounter (Signed)
Please virtual apt with someone today

## 2021-01-27 NOTE — Telephone Encounter (Signed)
Spoke with the patient and she stated that she tested positive yesterday (01/26/2021). Her mother is also positive. She is only having diarrhea. Denies shortness of breath, fever, headache. Please advise

## 2021-01-27 NOTE — Telephone Encounter (Signed)
FYI, Pt was scheduled for a virtual visit with Dr. Einar Pheasant for 12:20 today. 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 I asked if pt wants to do the virtual visit and pt said no. Appt cancelled.

## 2021-01-27 NOTE — Telephone Encounter (Signed)
Patient calling to report she tested covid+, home test.  Patient seeking advice for diarrhea, no other symptoms

## 2021-01-28 DIAGNOSIS — Z794 Long term (current) use of insulin: Secondary | ICD-10-CM | POA: Diagnosis not present

## 2021-01-28 DIAGNOSIS — E1043 Type 1 diabetes mellitus with diabetic autonomic (poly)neuropathy: Secondary | ICD-10-CM | POA: Diagnosis not present

## 2021-01-28 NOTE — Telephone Encounter (Signed)
Patient was scheduled for visit at 12:20 pm with Dr. Einar Pheasant on 01/27/2021. Patient cancelled stating that she was feeling better.

## 2021-01-28 NOTE — Progress Notes (Signed)
Patient cancelled appt.

## 2021-02-03 ENCOUNTER — Other Ambulatory Visit: Payer: Self-pay

## 2021-02-03 ENCOUNTER — Telehealth (INDEPENDENT_AMBULATORY_CARE_PROVIDER_SITE_OTHER): Payer: Medicare Other | Admitting: Psychiatry

## 2021-02-03 DIAGNOSIS — F332 Major depressive disorder, recurrent severe without psychotic features: Secondary | ICD-10-CM

## 2021-02-03 DIAGNOSIS — G4701 Insomnia due to medical condition: Secondary | ICD-10-CM | POA: Diagnosis not present

## 2021-02-03 DIAGNOSIS — F4010 Social phobia, unspecified: Secondary | ICD-10-CM

## 2021-02-03 MED ORDER — ESCITALOPRAM OXALATE 10 MG PO TABS: 10.0000 mg | ORAL_TABLET | Freq: Every day | ORAL | 0 refills | Status: DC

## 2021-02-03 MED ORDER — LAMOTRIGINE 100 MG PO TABS: 100.0000 mg | ORAL_TABLET | Freq: Two times a day (BID) | ORAL | 0 refills | Status: DC

## 2021-02-03 NOTE — Progress Notes (Signed)
Virtual Visit via Telephone Note  I connected with Leslie Lane on 02/03/21 at  9:45 AM EDT by telephone and verified that I am speaking with the correct person using two identifiers.  Location: Patient: home Provider: office   I discussed the limitations, risks, security and privacy concerns of performing an evaluation and management service by telephone and the availability of in person appointments. I also discussed with the patient that there may be a patient responsible charge related to this service. The patient expressed understanding and agreed to proceed.   History of Present Illness: Leslie Lane continues to struggle with low motivation and energy. Leslie Lane does not understand why she is always so fatigued. She is continuously dealing with her blood sugars. She had COVID last week and is recovering well. Her depression is ongoing. She denies any worsening of symptoms. She usually feels better after taking a shower after eating lunch. Her sleep is good. She denies SI/HI. Leslie Lane does not want her meds changed today.     Observations/Objective:  General Appearance: unable to assess  Eye Contact:  unable to assess  Speech:  Clear and Coherent and Normal Rate  Volume:  Normal  Mood:  Anxious and Depressed  Affect:  Congruent  Thought Process:  Coherent and Descriptions of Associations: Circumstantial  Orientation:  Full (Time, Place, and Person)  Thought Content:  Logical  Suicidal Thoughts:  No  Homicidal Thoughts:  No  Memory:  Immediate;   Good  Judgement:  Fair  Insight:  Good  Psychomotor Activity: unable to assess  Concentration:  Concentration: Good  Recall:  Good  Fund of Knowledge:  Good  Language:  Good  Akathisia:  unable to assess  Handed:  unable to assess  AIMS (if indicated):     Assets:  Communication Skills Desire for Improvement Financial Resources/Insurance Housing Talents/Skills Transportation Vocational/Educational  ADL's:  unable to assess  Cognition:   WNL  Sleep:        Assessment and Plan: - Leslie Lane does not want to increase her Lexapro at this time - no refill on Trazodone as she scripts waiting to be filled at her pharmacy.   1. Major depressive disorder, recurrent, severe without psychotic features ([place]) - escitalopram (LEXAPRO) 10 MG tablet; Take 1 tablet (10 mg total) by mouth daily.  Dispense: 90 tablet; Refill: 0 - lamoTRIgine (LAMICTAL) 100 MG tablet; Take 1 tablet (100 mg total) by mouth 2 (two) times daily.  Dispense: 180 tablet; Refill: 0  2. Social anxiety disorder - escitalopram (LEXAPRO) 10 MG tablet; Take 1 tablet (10 mg total) by mouth daily.  Dispense: 90 tablet; Refill: 0 - lamoTRIgine (LAMICTAL) 100 MG tablet; Take 1 tablet (100 mg total) by mouth 2 (two) times daily.  Dispense: 180 tablet; Refill: 0  3. Insomnia due to medical condition - trazodone  Follow Up Instructions: In 2-3 months or sooner if needed   I discussed the assessment and treatment plan with the patient. The patient was provided an opportunity to ask questions and all were answered. The patient agreed with the plan and demonstrated an understanding of the instructions.   The patient was advised to call back or seek an in-person evaluation if the symptoms worsen or if the condition fails to improve as anticipated.  I provided 11 minutes of non-face-to-face time during this encounter.   Charlcie Cradle, MD

## 2021-02-17 ENCOUNTER — Other Ambulatory Visit: Payer: Self-pay | Admitting: Endocrinology

## 2021-02-18 ENCOUNTER — Telehealth: Payer: Self-pay | Admitting: Pharmacist

## 2021-02-18 NOTE — Progress Notes (Signed)
Chronic Care Management Pharmacy Assistant   Name: Leslie Lane  MRN: [id number] DOB: 02-07-1956   Reason for Encounter: Disease State   Conditions to be addressed/monitored: General    Recent office visits:  None ID  Recent consult visits:  02/03/21 Dr. [facility] (Depression)  Hospital visits:  None in previous 6 months  Medications: Outpatient Encounter Medications as of 02/18/2021  Medication Sig Note   Accu-Chek Softclix Lancets lancets Use as instructed to check blood sugar 4 times per day dx code E10.65 12/16/2020: use   Alcohol Swabs (B-D SINGLE USE SWABS REGULAR) PADS USE FOUR TIMES DAILY 12/16/2020: use   aspirin EC 81 MG tablet Take 81 mg by mouth every morning.    Blood Glucose Calibration (ACCU-CHEK AVIVA) SOLN USE AS DIRECTED 12/16/2020: use   Blood Glucose Monitoring Suppl (ACCU-CHEK AVIVA PLUS) w/Device KIT USE AS DIRECTED 12/16/2020: use   diclofenac Sodium (VOLTAREN) 1 % GEL APPLY 2 GRAMS EXTERNALLY TO THE AFFECTED AREA FOUR TIMES DAILY    donepezil (ARICEPT) 10 MG tablet Take 1 tablet every night    escitalopram (LEXAPRO) 10 MG tablet Take 1 tablet (10 mg total) by mouth daily.    gabapentin (NEURONTIN) 100 MG capsule Take 1 to 2 capsules by mouth twice daily.    glucose blood (ACCU-CHEK AVIVA PLUS) test strip TEST BLOOD SUGAR FOUR TIMES DAILY 12/16/2020: use   GVOKE HYPOPEN 2-PACK 1 MG/0.2ML SOAJ INJECT 1 MG INTO THE SKIN AS NEEDED FOR HYPOGLYCEMIA    insulin detemir (LEVEMIR FLEXTOUCH) 100 UNIT/ML FlexPen Inject 3 units in the morning and 6 units at bedtime    insulin lispro (HUMALOG KWIKPEN) 100 UNIT/ML KwikPen inject 10 UNITS UNDER THE SKIN EVERY MORNING AND inject EIGHT UNITS EVERY EVENING PER sliding scale. inject 1-3 UNITS AS NEEDED FOR high blood sugars (Patient taking differently: 2-4 Units. inject 10 UNITS UNDER THE SKIN EVERY MORNING AND inject EIGHT UNITS EVERY EVENING PER sliding scale. inject 1-3 UNITS AS NEEDED FOR high blood  sugars) 09/20/2020: Pt doses herself by what she eats and blood sugar reading   Insulin Pen Needle 31G X 8 MM MISC Use 4 pen needles per day to inject Insulin 12/16/2020: use   lamoTRIgine (LAMICTAL) 100 MG tablet Take 1 tablet (100 mg total) by mouth 2 (two) times daily.    lidocaine (LIDODERM) 5 % UNWRAP AND APPLY 1 PATCH TO SKIN EVERY DAY(REMOVE AND DISCARD PATCH WITHIN 12 HOURS OR AS DIRECTED BY DOCTOR)    lipase/protease/amylase (CREON) 36000 UNITS CPEP capsule Take 2 capsules (72,000 Units total) by mouth 3 (three) times daily before meals.    loratadine (CLARITIN) 10 MG tablet Take 10 mg by mouth as needed for allergies.    lovastatin (MEVACOR) 40 MG tablet Take 1 tablet (40 mg total) by mouth at bedtime.    Magnesium 500 MG TABS Take 1 tablet by mouth as needed.    metoCLOPramide (REGLAN) 5 MG tablet TAKE 1 TABLET THREE TIMES DAILY BEFORE MEALS AS NEEDED    Multiple Vitamin (MULTIVITAMIN) tablet Take 1 tablet by mouth daily. Reported on 11/22/2015 09/20/2020: Taking gummy vitamins   Omega-3 Fatty Acids (FISH OIL) 1000 MG CAPS Take 1 capsule by mouth daily.    omeprazole (PRILOSEC) 40 MG capsule TAKE ONE CAPSULE BY MOUTH ONCE DAILY    traZODone (DESYREL) 100 MG tablet Take 4 tablets (400 mg total) by mouth at bedtime.    umeclidinium-vilanterol (ANORO ELLIPTA) 62.5-25 MCG/INH AEPB Inhale 1 puff into the lungs  daily.    vitamin B-12 (CYANOCOBALAMIN) 1000 MCG tablet Take 1 tablet by mouth daily.    vitamin E 1000 UNIT capsule Take 1,000 Units by mouth daily.    No facility-administered encounter medications on file as of 02/18/2021.    Contacted Leslie Lane on 02/18/21 for general disease state and medication adherence call.   Patient is not > 5 days past due for refill on the following medications per chart history:  [facility] Medications: Medication Name/mg Last Fill Days Supply Lovastatin 40 mg  01/05/21  90   What concerns do you have about your medications? Patient states that she des  not have any concerns abut medications  The patient denies side effects with her medications.   How often do you forget or accidentally miss a dose? Never  Do you use a pillbox? No, she has one does not use it. Keeps all meds in a vial  Are you having any problems getting your medications from your pharmacy? No  Has the cost of your medications been a concern? No If yes, what medication and is patient assistance available or has it been applied for?  Since last visit with CPP, no interventions have been made:   The patient has not had an ED visit since last contact.   The patient reports the following problems with their health. Patient states that her only issue is that she feels bad everyday with her fibromyalgia  she does not have any  concerns or questions for Leslie Lane. D at this time.   Counseled patient on:  Benefits of adherence packaging or a pillbox   Care Gaps: Last annual wellness visit:06/10/20 If Diabetic: Last eye exam / retinopathy screening:01/07/21 Last diabetic foot exam:06/10/20  PCP appointment on 06/17/21    [facility] Pharmacist Assistant [phone]   Time spent: 38

## 2021-02-21 ENCOUNTER — Other Ambulatory Visit: Payer: Self-pay | Admitting: Endocrinology

## 2021-02-22 ENCOUNTER — Other Ambulatory Visit: Payer: Self-pay | Admitting: *Deleted

## 2021-02-22 DIAGNOSIS — Z87891 Personal history of nicotine dependence: Secondary | ICD-10-CM

## 2021-02-22 DIAGNOSIS — F17210 Nicotine dependence, cigarettes, uncomplicated: Secondary | ICD-10-CM

## 2021-02-28 ENCOUNTER — Other Ambulatory Visit (INDEPENDENT_AMBULATORY_CARE_PROVIDER_SITE_OTHER): Payer: Medicare Other

## 2021-02-28 ENCOUNTER — Other Ambulatory Visit: Payer: Self-pay

## 2021-02-28 DIAGNOSIS — E1065 Type 1 diabetes mellitus with hyperglycemia: Secondary | ICD-10-CM | POA: Diagnosis not present

## 2021-02-28 DIAGNOSIS — E1043 Type 1 diabetes mellitus with diabetic autonomic (poly)neuropathy: Secondary | ICD-10-CM | POA: Diagnosis not present

## 2021-02-28 DIAGNOSIS — Z794 Long term (current) use of insulin: Secondary | ICD-10-CM | POA: Diagnosis not present

## 2021-02-28 LAB — GLUCOSE, RANDOM: Glucose, Bld: 88 mg/dL (ref 70–99)

## 2021-02-28 LAB — BASIC METABOLIC PANEL
BUN: 19 mg/dL (ref 6–23)
CO2: 32 mEq/L (ref 19–32)
Calcium: 9.4 mg/dL (ref 8.4–10.5)
Chloride: 102 mEq/L (ref 96–112)
Creatinine, Ser: 0.79 mg/dL (ref 0.40–1.20)
GFR: 77.71 mL/min (ref >60.00)
Glucose, Bld: 88 mg/dL (ref 70–99)
Potassium: 4.2 mEq/L (ref 3.5–5.1)
Sodium: 141 mEq/L (ref 135–145)

## 2021-02-28 LAB — HEMOGLOBIN A1C: Hgb A1c MFr Bld: 6.6 % — ABNORMAL HIGH (ref 4.6–6.5)

## 2021-03-01 LAB — FRUCTOSAMINE: Fructosamine: 325 umol/L — ABNORMAL HIGH (ref 0–285)

## 2021-03-03 ENCOUNTER — Encounter: Payer: Self-pay | Admitting: Endocrinology

## 2021-03-03 ENCOUNTER — Ambulatory Visit (INDEPENDENT_AMBULATORY_CARE_PROVIDER_SITE_OTHER): Payer: Medicare Other | Admitting: Endocrinology

## 2021-03-03 ENCOUNTER — Other Ambulatory Visit: Payer: Self-pay

## 2021-03-03 VITALS — BP 116/50 | HR 69 | Ht 63.5 in | Wt 102.8 lb

## 2021-03-03 DIAGNOSIS — K3184 Gastroparesis: Secondary | ICD-10-CM | POA: Diagnosis not present

## 2021-03-03 DIAGNOSIS — E1043 Type 1 diabetes mellitus with diabetic autonomic (poly)neuropathy: Secondary | ICD-10-CM | POA: Diagnosis not present

## 2021-03-03 DIAGNOSIS — E1065 Type 1 diabetes mellitus with hyperglycemia: Secondary | ICD-10-CM

## 2021-03-03 DIAGNOSIS — E7800 Pure hypercholesterolemia, unspecified: Secondary | ICD-10-CM

## 2021-03-03 DIAGNOSIS — R5382 Chronic fatigue, unspecified: Secondary | ICD-10-CM

## 2021-03-03 DIAGNOSIS — E10649 Type 1 diabetes mellitus with hypoglycemia without coma: Secondary | ICD-10-CM | POA: Diagnosis not present

## 2021-03-03 MED ORDER — INSULIN LISPRO (1 UNIT DIAL) 100 UNIT/ML (KWIKPEN): PEN_INJECTOR | SUBCUTANEOUS | 1 refills | Status: DC

## 2021-03-03 MED ORDER — INSULIN PEN NEEDLE 32G X 4 MM MISC: 2 refills | Status: AC

## 2021-03-03 NOTE — Progress Notes (Signed)
Patient ID: Leslie Lane, female   DOB: 06-05-1956, 65 y.o.   MRN: [id number]  Reason for Appointment: Endocrinology follow-up   History of Present Illness    Diagnosis: Type 1 DIABETES MELITUS, diagnosed 1967      She has had labile blood sugar control over the last several years even though A1c has been usually around 7% She has had less lability and hypoglycemia with taking b.i.d. Lantus compared once a day She has been very sensitive to fast acting insulin and frequently does not require mealtime coverage She cannot tolerate Toujeo because of reported episode of headache, bloating and nausea with the first dose  RECENT history:  Insulin regimen: LEVEMIR 3 units in the morning and 6 at 11 pm HUMALOG 1-2 units up to 3 times a day  Current blood sugar patterns, control and problems identified:  Her A1c is about the same at 6.6  She has been using the freestyle libre but she forgot her reader today  Again she does not think it is consistently accurate but occasionally it is close to the fingersticks  She continue to check her blood sugars 4 times a day on an average on her Accu-Chek  She was previously told to continue 6 units of Levemir in the evening but she may have been taking 7 units until recently and now down to 6  This is likely to be from her having 3 readings in the 40s early morning  Recently however she thinks that she can sometimes feel the symptoms of low sugars  Although she has had readings as high as 268 she does not have any consistent hyperglycemia and only has sporadic high readings over 180 at different times  She thinks she is not eating large portions or a lot of snacks and only rarely has high readings in the evenings and generally not in the morning as before HYPOGLYCEMIA is most often present in the afternoons or midday with readings as low as 26 She is trying to treat hypoglycemia with juice or glucose tablets but did not like glucose tablets   For most of her hyperglycemia is when she starts walking or going to the store like [employer] and walks around a lot She does not always take enough snacks or carbohydrate before she is active to prevent low sugars  Mealtimes: Breakfast 8-8.30 am, lunch 12 noon , dinner 5 pm  Hypoglycemia: Symptoms may be absent with low blood sugars and does not have early warning symptoms. Gets confused and she frequently depends on her mother to recognize low sugars and treat them.  Her mother knows how to give Glucagon injection  She will treat her low blood sugars usually with juice, does carry glucose tablets when not at home   PRE-MEAL Fasting Lunch Dinner Overnight Overall  Glucose range: 35-151    26-268  Mean/median: 120 85 99 96 106   POST-MEAL PC Breakfast PC Lunch PC Dinner  Glucose range:  98 151  Mean/median:        From freestyle libre version 2 data previously:  Hyperglycemia is present mostly late at night starting around 11 PM through 5 AM also Blood sugars are fairly consistently low normal or low around 8-9 PM Also has some hypoglycemia tendency around 9-10 AM after breakfast Postprandial readings are low after dinner and relatively lower after breakfast but may be moderately higher after lunch On an average blood sugars are within the target range except around 2 AM Moderate variability is  present      CGM use % of time 96  2-week average/GV 139  Time in range      64%  % Time Above 180  19+3  % Time above 250   % Time Below 70  14     PRE-MEAL Fasting Lunch Dinner Bedtime Overall  Glucose range:       Averages:  141   112  126    POST-MEAL PC Breakfast PC Lunch PC Dinner  Glucose range:     Averages:  108  150  85        Wt Readings from Last 3 Encounters:  03/03/21 102 lb 12.8 oz (46.6 kg)  01/25/21 100 lb 9.6 oz (45.6 kg)  12/22/20 106 lb 6.4 oz (48.3 kg)    Lab Results  Component Value Date   HGBA1C 6.6 (H) 02/28/2021   HGBA1C 6.4 (A) 12/16/2020    HGBA1C 6.8 (H) 09/16/2020   Lab Results  Component Value Date   MICROALBUR 1.0 06/10/2020   LDLCALC 84 09/16/2020   CREATININE 0.79 02/28/2021        Allergies as of 03/03/2021       Reactions   Penicillins Anaphylaxis   Sulfonamide Derivatives Anaphylaxis        Medication List        Accurate as of March 03, 2021  1:50 PM. If you have any questions, ask your nurse or doctor.          Accu-Chek Aviva Plus test strip Generic drug: glucose blood TEST BLOOD SUGAR FOUR TIMES DAILY   Accu-Chek Aviva Plus w/Device Kit USE AS DIRECTED   Accu-Chek Aviva Soln USE AS DIRECTED   Accu-Chek Softclix Lancets lancets Use as instructed to check blood sugar 4 times per day dx code E10.65   Anoro Ellipta 62.5-25 MCG/INH Aepb Generic drug: umeclidinium-vilanterol Inhale 1 puff into the lungs daily.   aspirin EC 81 MG tablet Take 81 mg by mouth every morning.   B-D SINGLE USE SWABS REGULAR Pads USE FOUR TIMES DAILY   diclofenac Sodium 1 % Gel Commonly known as: VOLTAREN APPLY 2 GRAMS EXTERNALLY TO THE AFFECTED AREA FOUR TIMES DAILY   donepezil 10 MG tablet Commonly known as: ARICEPT Take 1 tablet every night   escitalopram 10 MG tablet Commonly known as: Lexapro Take 1 tablet (10 mg total) by mouth daily.   Fish Oil 1000 MG Caps Take 1 capsule by mouth daily.   gabapentin 100 MG capsule Commonly known as: NEURONTIN Take 1 to 2 capsules by mouth twice daily.   Gvoke HypoPen 2-Pack 1 MG/0.2ML Soaj Generic drug: Glucagon INJECT 1 MG UNDER THE SKIN AS NEEDED FOR LOW BLOOD SUGAR   insulin lispro 100 UNIT/ML KwikPen Commonly known as: HumaLOG KwikPen inject 10 UNITS UNDER THE SKIN EVERY MORNING AND inject EIGHT UNITS EVERY EVENING PER sliding scale. inject 1-3 UNITS AS NEEDED FOR high blood sugars What changed: how much to take   Insulin Pen Needle 31G X 8 MM Misc Use 4 pen needles per day to inject Insulin   lamoTRIgine 100 MG tablet Commonly known as:  LAMICTAL Take 1 tablet (100 mg total) by mouth 2 (two) times daily.   Levemir FlexTouch 100 UNIT/ML FlexPen Generic drug: insulin detemir Inject 3 units in the morning and 6 units at bedtime   lidocaine 5 % Commonly known as: LIDODERM UNWRAP AND APPLY 1 PATCH TO SKIN EVERY DAY(REMOVE AND DISCARD PATCH WITHIN 12 HOURS OR AS DIRECTED BY DOCTOR)  lipase/protease/amylase 36000 UNITS Cpep capsule Commonly known as: Creon Take 2 capsules (72,000 Units total) by mouth 3 (three) times daily before meals.   loratadine 10 MG tablet Commonly known as: CLARITIN Take 10 mg by mouth as needed for allergies.   lovastatin 40 MG tablet Commonly known as: MEVACOR Take 1 tablet (40 mg total) by mouth at bedtime.   Magnesium 500 MG Tabs Take 1 tablet by mouth as needed.   metoCLOPramide 5 MG tablet Commonly known as: REGLAN TAKE 1 TABLET THREE TIMES DAILY BEFORE MEALS AS NEEDED   multivitamin tablet Take 1 tablet by mouth daily. Reported on 11/22/2015   omeprazole 40 MG capsule Commonly known as: PRILOSEC TAKE ONE CAPSULE BY MOUTH ONCE DAILY   traZODone 100 MG tablet Commonly known as: DESYREL Take 4 tablets (400 mg total) by mouth at bedtime.   vitamin B-12 1000 MCG tablet Commonly known as: CYANOCOBALAMIN Take 1 tablet by mouth daily.   vitamin E 1000 UNIT capsule Take 1,000 Units by mouth daily.        Allergies:  Allergies  Allergen Reactions   Penicillins Anaphylaxis   Sulfonamide Derivatives Anaphylaxis    Past Medical History:  Diagnosis Date   ALLERGIC RHINITIS    Allergy    Anemia    Anxiety    Arthritis    BIPOLAR AFFECTIVE DISORDER    Cataract    DEPRESSION    Diabetes mellitus type I ([place])    DIABETES MELLITUS, TYPE II    follows with endo   Fibromyalgia    GERD    HEPATITIS B    HYPERLIPIDEMIA    LOW BACK PAIN, CHRONIC    MIGRAINE HEADACHE    NARCOTIC ABUSE    hx of   NECK PAIN, CHRONIC    Neuropathy    Seizures ([place])    pt states, "if my  blood sugar drops to the 20's, I convulse.  It hasn't happened in a long time."   SMOKER     Past Surgical History:  Procedure Laterality Date   CESAREAN SECTION     COLONOSCOPY     TUBAL LIGATION     UPPER GASTROINTESTINAL ENDOSCOPY      Family History  Problem Relation Age of Onset   Arthritis Mother    Colon cancer Mother        ? age of dx   Arthritis Father    Kidney disease Father    Kidney cancer Father    Heart attack Brother    Bladder Cancer Sister    Hyperlipidemia Other    Kidney cancer Paternal Aunt    Anxiety disorder Neg Hx    Bipolar disorder Neg Hx    Depression Neg Hx    Breast cancer Neg Hx    Esophageal cancer Neg Hx    Stomach cancer Neg Hx    Rectal cancer Neg Hx     Social History:  reports that she has been smoking cigarettes. She has a 80.00 pack-year smoking history. She has never used smokeless tobacco. She reports current alcohol use. She reports that she does not use drugs.  Review of Systems:   Had gastroparesis treated with Reglan tid-qid with fairly good control but she will sometimes have an abdominal discomfort in the morning but no vomiting   NEUROPATHY: She has history of burning in her legs and feet at times treated with gabapentin 100 mg   DEPRESSION: She has had long-standing depression and anxiety on long-term treatment  HYPERLIPIDEMIA:  The lipid abnormality consists of elevated LDL   Is  on lovastatin 40 mg with  good control   Lab Results  Component Value Date   CHOL 181 09/16/2020   HDL 84.40 09/16/2020   LDLCALC 84 09/16/2020   LDLDIRECT 101.7 03/26/2012   TRIG 64.0 09/16/2020   CHOLHDL 2 09/16/2020    Memory difficulties: She was given Aricept by the neurologist, is taking 10 mg    Examination:   BP (!) 116/50   Pulse 69   Ht 5' 3.5" (1.613 m)   Wt 102 lb 12.8 oz (46.6 kg)   SpO2 96%   BMI 17.92 kg/m   Body mass index is 17.92 kg/m.    ASSESSMENT/ PLAN:   Diabetes type 1 with poor control  See  history of present illness for detailed discussion of current insulin, blood sugar patterns, problems identified  A1c has been under 7% and now 6.6  She is using both the freestyle libre and the Accu-Chek meter but only data from Accu-Chek was available today  Even though she has the alerts on her freestyle libre she is still periodically getting low blood sugars in the 20s-40s This seems to be a little less likely but in the last week has had 3 readings in the 40s Most of her insulin is again only basal Compared to her usual visits she has not had as much hyperglycemia from cutting back on her snacks and junk food  However still not likely consistently getting enough snacks to prevent low sugars when she is going out walking  Discussed availability of the freestyle libre app which she is not using and prefers to use the meter Also previously did not like the Dexcom as she could not feel comfortable inserting the sensor on her abdomen  She will be needing to cut back on her evening Levemir to 5 units and possibly 4 units depending on her morning sugars Also she will need to make sure she is getting up early enough during the night if the alert indicates readings below 70 She needs enough carbohydrate as a snack especially if blood sugars are normal before she starts walking She can use various treatment for low blood sugars including raisins or juice Advised her to call the company or the nurse educator to set up her freestyle [place] app on the phone  For her gastroparesis she can take additional 1 tablet in the morning if she is having nausea and abdominal discomfort Continue to follow-up with her PCP regarding abdominal discomfort  New prescription for Humalog sent Also changed her pen needle size from 8 mm to 4 mm  Will check TSH for her fatigue on the next visit, has not been checked since 2019  Follow-up in 3 months with A1c  Total visit time including counseling = 30  minutes  There are no Patient Instructions on file for this visit.      [name] 03/03/2021, 1:50 PM

## 2021-03-03 NOTE — Patient Instructions (Addendum)
Reduce pm levemir to 5 units   Take a snack like 1/2 sandwich before walking  Snack or juice if sugar going below 75  Call back to start using phone app for [employer]

## 2021-03-09 ENCOUNTER — Other Ambulatory Visit: Payer: Self-pay

## 2021-03-15 ENCOUNTER — Ambulatory Visit (INDEPENDENT_AMBULATORY_CARE_PROVIDER_SITE_OTHER): Payer: Medicare Other | Admitting: Psychology

## 2021-03-15 ENCOUNTER — Encounter: Payer: Self-pay | Admitting: Psychology

## 2021-03-15 ENCOUNTER — Other Ambulatory Visit: Payer: Self-pay

## 2021-03-15 ENCOUNTER — Ambulatory Visit: Payer: Medicare Other | Admitting: Psychology

## 2021-03-15 DIAGNOSIS — R4189 Other symptoms and signs involving cognitive functions and awareness: Secondary | ICD-10-CM

## 2021-03-15 DIAGNOSIS — F319 Bipolar disorder, unspecified: Secondary | ICD-10-CM

## 2021-03-15 DIAGNOSIS — F1911 Other psychoactive substance abuse, in remission: Secondary | ICD-10-CM | POA: Insufficient documentation

## 2021-03-15 DIAGNOSIS — F0670 Mild neurocognitive disorder due to known physiological condition without behavioral disturbance: Secondary | ICD-10-CM

## 2021-03-15 DIAGNOSIS — E108 Type 1 diabetes mellitus with unspecified complications: Secondary | ICD-10-CM

## 2021-03-15 DIAGNOSIS — F331 Major depressive disorder, recurrent, moderate: Secondary | ICD-10-CM

## 2021-03-15 DIAGNOSIS — F411 Generalized anxiety disorder: Secondary | ICD-10-CM

## 2021-03-15 DIAGNOSIS — G3184 Mild cognitive impairment, so stated: Secondary | ICD-10-CM | POA: Diagnosis not present

## 2021-03-15 DIAGNOSIS — M797 Fibromyalgia: Secondary | ICD-10-CM

## 2021-03-15 HISTORY — DX: Mild neurocognitive disorder due to known physiological condition without behavioral disturbance: F06.70

## 2021-03-15 HISTORY — DX: Mild cognitive impairment, so stated: G31.84

## 2021-03-15 NOTE — Progress Notes (Signed)
   Psychometrician Note   Cognitive testing was administered to Leslie Lane by Milana Kidney, B.S. (psychometrist) under the supervision of Dr. Christia Reading, Ph.D., licensed psychologist on 03/15/21. Leslie Lane did not appear overtly distressed by the testing session per behavioral observation or responses across self-report questionnaires. Rest breaks were offered.    The battery of tests administered was selected by Dr. Christia Reading, Ph.D. with consideration to Leslie Lane's current level of functioning, the nature of her symptoms, emotional and behavioral responses during interview, level of literacy, observed level of motivation/effort, and the nature of the referral question. This battery was communicated to the psychometrist. Communication between Dr. Christia Reading, Ph.D. and the psychometrist was ongoing throughout the evaluation and Dr. Christia Reading, Ph.D. was immediately accessible at all times. Dr. Christia Reading, Ph.D. provided supervision to the psychometrist on the date of this service to the extent necessary to assure the quality of all services provided.    Leslie Lane will return within approximately 1-2 weeks for an interactive feedback session with Dr. Melvyn Novas at which time her test performances, clinical impressions, and treatment recommendations will be reviewed in detail. Leslie Lane understands she can contact our office should she require our assistance before this time.  A total of 95 minutes of billable time were spent face-to-face with Leslie Lane by the psychometrist. This includes both test administration and scoring time. Billing for these services is reflected in the clinical report generated by Dr. Christia Reading, Ph.D.  This note reflects time spent with the psychometrician and does not include test scores or any clinical interpretations made by Dr. Melvyn Novas. The full report will follow in a separate note.

## 2021-03-15 NOTE — Progress Notes (Signed)
NEUROPSYCHOLOGICAL EVALUATION [facility] Department of Neurology  Date of Evaluation: March 15, 2021  Reason for Referral:   Leslie Lane is a 65 y.o. right-handed Caucasian female referred by  Sharene Butters, PA-C , to characterize her current cognitive functioning and assist with diagnostic clarity and treatment planning in the context of subjective cognitive decline and numerous medical and psychiatric comorbidities.   Assessment and Plan:   Clinical Impression(s): Leslie Lane exhibited very limited testing tolerance and abruptly discontinued the evaluation prematurely despite the provision of notable encouragement to continue by the psychometrist. Prior to her discontinuation, performances across validity measures were appropriate. Thus, tasks that were completed can be viewed as being a broadly accurate reflection of her current abilities. Unfortunately, given the abbreviated nature of testing, several domains were unable to be thoroughly assessed and the current conceptualization is limited by this.   Leslie Lane pattern of performance is suggestive of prominent difficulties with basic attention, expressive language, visuospatial abilities, and encoding (i.e., learning) aspects of memory. Assessed processing speed, executive functioning (i.e., cognitive flexibility), and retrieval aspects of memory were variable. Working Marine scientist, Physiological scientist, and additional aspects of executive functioning were unable to be assessed. Leslie Lane denied difficulties completing instrumental activities of daily living (ADLs) independently. As such, given evidence for cognitive dysfunction described above, she best meets criteria for a Mild Neurocognitive Disorder ("mild cognitive impairment") at the present time.  The etiology of her dysfunction is somewhat unclear but likely multifactorial in nature. Firstly, consideration needs to be given to notable cerebrovascular  contributions. Leslie Lane was inconsistent during interview and often contradicted medical records. While she stated to me that cognitive dysfunction had been present for the past 5-7 years, she has reportedly told other physicians that dysfunction has been present since emerging from her diabetic coma when she was 65 years old. When asked directly about this, she then reported to me that she had significant cognitive dysfunction after that medical event and went on disability. Prior neuroimaging in 2020 and 2021 did reveal small vessel ischemic disease. Overall, it remains plausible that current weaknesses are directly related to her prior diabetic coma and ongoing cardiovascular and other medical conditions.  Additionally, Leslie Lane has a significant psychiatric history in the form of bipolar disorder, severe depression, and notable anxiety. Across mood-related questionnaires, she reported moderate to severe symptoms occurring during the past 1-2 weeks despite reporting during interview that her mood had been unremarkable outside of sleep concerns. While she denied prior substance abuse during interview, medical records also suggest prior alcohol, cocaine, and opiate abuse. Taken together, her substance abuse history, as well as chronic and acute psychiatric distress, is very likely exacerbating deficits related to various vascular conditions. Ongoing sleep dysfunction, chronic pain, and frequent headaches would also contribute to dysfunction.   Delayed memory was a relative strength across the current evaluation, making Alzheimer's disease unlikely. She is also somewhat young for the presence of this disease process at the present time. She also did not demonstrate behavioral features concerning for Lewy body dementia, frontotemporal dementia, or other more rare parkinsonian conditions (e.g., corticobasal degeneration). Continued medical monitoring will be important moving  forward.  Recommendations: Should Leslie Lane become more willing to fully engage in the testing process, a repeat neuropsychological evaluation could be considered at some point in the future.   A combination of medication and psychotherapy has been shown to be most effective at treating symptoms of anxiety and depression. As such, Ms.  Lane is encouraged to speak with her prescribing physician regarding medication adjustments to optimally manage these symptoms. Likewise, Leslie Lane is encouraged to consider engaging in short-term psychotherapy to address symptoms of psychiatric distress. She would benefit from an active and collaborative therapeutic environment, rather than one purely supportive in nature. Recommended treatment modalities include Cognitive Behavioral Therapy (CBT) or Acceptance and Commitment Therapy (ACT).  Leslie Lane is encouraged to attend to lifestyle factors for brain health (e.g., regular physical exercise, good nutrition habits, regular participation in cognitively-stimulating activities, and general stress management techniques), which are likely to have benefits for both emotional adjustment and cognition. In fact, in addition to promoting good general health, regular exercise incorporating aerobic activities (e.g., brisk walking, jogging, cycling, etc.) has been demonstrated to be a very effective treatment for depression and stress, with similar efficacy rates to both antidepressant medication and psychotherapy. Full abstinence from alcohol and other substances will be vital in sustaining cognitive abilities. Optimal control of vascular risk factors (including safe cardiovascular exercise and adherence to dietary recommendations) is also encouraged.   When learning new information, she would benefit from information being broken up into small, manageable pieces. She may also find it helpful to articulate the material in her own words and in a context to promote encoding at the  onset of a new task. This material may need to be repeated multiple times to promote encoding.  Memory can be improved using internal strategies such as rehearsal, repetition, chunking, mnemonics, association, and imagery. External strategies such as written notes in a consistently used memory journal, visual and nonverbal auditory cues such as a calendar on the refrigerator or appointments with alarm, such as on a cell phone, can also help maximize recall.    To address problems with processing speed, she may wish to consider:   -Ensuring that she is alerted when essential material or instructions are being presented   -Adjusting the speed at which new information is presented   -Allowing for more time in comprehending, processing, and responding in conversation  To address problems with fluctuating attention, she may wish to consider:   -Avoiding external distractions when needing to concentrate   -Limiting exposure to fast paced environments with multiple sensory demands   -Writing down complicated information and using checklists   -Attempting and completing one task at a time (i.e., no multi-tasking)   -Verbalizing aloud each step of a task to maintain focus   -Taking frequent breaks during the completion of steps/tasks to avoid fatigue   -Reducing the amount of information considered at one time  Review of Records:   Ms. Trembath was seen by [facility] Marland KitchenEllouise Newer, M.D.) on 12/18/2018 for an evaluation of memory loss. At that time, Ms. Arenz reported that memory changes started after she was in the hospital in a diabetic coma at age 34. She denied any observable changes since that time. Upon emerging from her diabetic coma, she reported initially being unable to drive a car or remember a movie she had watched. Guardianship was utilized to manage her finances and she currently lives with her mother. She has been told she repeats herself and says the same thing every morning. She  manages her medications independently but has had some instances of getting lost while driving. Trouble with sustained attention/concentration was also reported. Sleep was said to be good with trazodone. She has mild bitemporal headaches which improve when she is not smoking. She also has chronic neck and back pain, as well as  neuropathy in her feet. She denied prior head injuries, paranoia, hallucinations, dizziness, vision changes, focal weakness, bowel/bladder dysfunction, anosmia, or tremors. Performance on a brief cognitive screening instrument (MOCA) was 14/30. Donepezil was started at that time.  She followed up with Dr. Delice Lesch virtually on 07/31/2019. Donepezil 63m was said to be tolerated well. No other notable changes were described relative to her June 2020 meeting with Dr. ADelice Lesch Current mood was described as "alright" and she reported daytime drowsiness. Performance on a brief cognitive screening instrument (blind [place] was 16/22, which would represent a slightly improved score relative to her previous 14/30.  She was most recently seen by SSharene Butters PA-C, for follow-up of memory concerns on 12/03/2020. Since her prior visit, Ms. BJesseehad stopped taking donepezil due to her running out of this medication. She was unaware that she had to follow-up with her physician after refills were exhausted. She stated that her memory has been "okay, but I can't remember what I was going to say." Mood has been controlled well and sleep is aided with trazodone. She denied paranoia, hallucinations, or REM sleep behaviors. ADLs were described as intact. She denied headaches, trauma, or injuries to the head, double vision,  dizziness, focal numbness or tingling, unilateral weakness, tremors, seizures, or urinary incontinence or retention. She continues to smoke cigarettes "like a cChief of Staffon a brief cognitive screening instrument (MMSE) was 22/30. Ultimately, Ms. BAdeyemiwas referred for a  comprehensive neuropsychological evaluation to characterize her cognitive abilities and to assist with diagnostic clarity and treatment planning.   Brain MRI on 01/13/2009 was unremarkable. Head CT on 05/03/2015 in the context of a reported alcohol-related seizure was negative. Brain MRI on 01/11/2019 revealed mild chronic microvascular ischemia without age advanced atrophy. Head CT on 10/01/2020 in the context of a syncope-related fall revealed mild atrophy with periventricular small vessel ischemia but no acute abnormality.   Past Medical History:  Diagnosis Date   Abdominal pain 02/28/2016   Abnormal CT scan, bladder    Noted 07/28/13 CT - s/p uro eval 08/2013 [place] -    Acquired trigger finger of left ring finger 02/18/2019   Injected February 18, 2019 and March 2022   Allergic rhinitis    Anemia    Arthritis    Bipolar disorder 07/05/2010   Cataract    Chronic lower back pain 07/05/2010   Chronic neck pain    Diabetic gastroparesis associated with type 1 diabetes mellitus 10/24/2016   Fibromyalgia    Generalized anxiety disorder 04/27/2014   GERD (gastroesophageal reflux disease) 07/05/2010   Hepatitis B virus infection 07/05/2010   History of substance abuse    opiates; cocaine   Hypoglycemia unawareness associated with type 1 diabetes mellitus 11/23/2020   Insomnia 04/13/2015   Major depressive disorder 07/28/2014   Migraine headache 07/05/2010   Mixed diabetic hyperlipidemia associated with type 1 diabetes mellitus 07/05/2010   Neuropathy    Protein calorie malnutrition 06/01/2017   Right shoulder pain 02/18/2018   Seizures    pt states, "if my blood sugar drops to the 20's, I convulse.  It hasn't happened in a long time."   Smokers' cough 07/05/2010   Type 1 diabetes mellitus with complication [id number]  WBC decreased 03/27/2017    Past Surgical History:  Procedure Laterality Date   CESAREAN SECTION     COLONOSCOPY     TUBAL LIGATION     UPPER GASTROINTESTINAL  ENDOSCOPY      Current Outpatient Medications:    Accu-Chek  Softclix Lancets lancets, Use as instructed to check blood sugar 4 times per day dx code E10.65, Disp: 400 each, Rfl: 1   Alcohol Swabs (B-D SINGLE USE SWABS REGULAR) PADS, USE FOUR TIMES DAILY, Disp: 400 each, Rfl: 2   aspirin EC 81 MG tablet, Take 81 mg by mouth every morning., Disp: , Rfl:    Blood Glucose Calibration (ACCU-CHEK AVIVA) SOLN, USE AS DIRECTED, Disp: 1 each, Rfl: 1   Blood Glucose Monitoring Suppl (ACCU-CHEK AVIVA PLUS) w/Device KIT, USE AS DIRECTED, Disp: 1 kit, Rfl: 0   diclofenac Sodium (VOLTAREN) 1 % GEL, APPLY 2 GRAMS EXTERNALLY TO THE AFFECTED AREA FOUR TIMES DAILY, Disp: 700 g, Rfl: 2   donepezil (ARICEPT) 10 MG tablet, Take 1 tablet every night, Disp: 90 tablet, Rfl: 3   escitalopram (LEXAPRO) 10 MG tablet, Take 1 tablet (10 mg total) by mouth daily., Disp: 90 tablet, Rfl: 0   gabapentin (NEURONTIN) 100 MG capsule, Take 1 to 2 capsules by mouth twice daily., Disp: 120 capsule, Rfl: 0   glucose blood (ACCU-CHEK AVIVA PLUS) test strip, TEST BLOOD SUGAR FOUR TIMES DAILY, Disp: 400 strip, Rfl: 2   GVOKE HYPOPEN 2-PACK 1 MG/0.2ML SOAJ, INJECT 1 MG UNDER THE SKIN AS NEEDED FOR LOW BLOOD SUGAR, Disp: 0.4 mL, Rfl: 1   insulin detemir (LEVEMIR FLEXTOUCH) 100 UNIT/ML FlexPen, Inject 3 units in the morning and 6 units at bedtime, Disp: 15 mL, Rfl: 5   insulin lispro (HUMALOG KWIKPEN) 100 UNIT/ML KwikPen, inject 1-3 UNITS tid for large meals, Disp: 15 mL, Rfl: 1   Insulin Pen Needle 32G X 4 MM MISC, Use inject insulin tid, Disp: 300 each, Rfl: 2   lamoTRIgine (LAMICTAL) 100 MG tablet, Take 1 tablet (100 mg total) by mouth 2 (two) times daily., Disp: 180 tablet, Rfl: 0   lidocaine (LIDODERM) 5 %, UNWRAP AND APPLY 1 PATCH TO SKIN EVERY DAY(REMOVE AND DISCARD PATCH WITHIN 12 HOURS OR AS DIRECTED BY DOCTOR), Disp: 30 patch, Rfl: 6   lipase/protease/amylase (CREON) 36000 UNITS CPEP capsule, Take 2 capsules (72,000 Units total) by  mouth 3 (three) times daily before meals., Disp: 300 capsule, Rfl: 3   loratadine (CLARITIN) 10 MG tablet, Take 10 mg by mouth as needed for allergies., Disp: , Rfl:    lovastatin (MEVACOR) 40 MG tablet, Take 1 tablet (40 mg total) by mouth at bedtime., Disp: 90 tablet, Rfl: 2   Magnesium 500 MG TABS, Take 1 tablet by mouth as needed., Disp: , Rfl:    metoCLOPramide (REGLAN) 5 MG tablet, TAKE 1 TABLET THREE TIMES DAILY BEFORE MEALS AS NEEDED, Disp: 270 tablet, Rfl: 1   Multiple Vitamin (MULTIVITAMIN) tablet, Take 1 tablet by mouth daily. Reported on 11/22/2015, Disp: , Rfl:    Omega-3 Fatty Acids (FISH OIL) 1000 MG CAPS, Take 1 capsule by mouth daily., Disp: , Rfl:    omeprazole (PRILOSEC) 40 MG capsule, TAKE ONE CAPSULE BY MOUTH ONCE DAILY, Disp: 90 capsule, Rfl: 1   traZODone (DESYREL) 100 MG tablet, Take 4 tablets (400 mg total) by mouth at bedtime., Disp: 120 tablet, Rfl: 1   umeclidinium-vilanterol (ANORO ELLIPTA) 62.5-25 MCG/INH AEPB, Inhale 1 puff into the lungs daily., Disp: 60 each, Rfl: 11   vitamin B-12 (CYANOCOBALAMIN) 1000 MCG tablet, Take 1 tablet by mouth daily., Disp: , Rfl:    vitamin E 1000 UNIT capsule, Take 1,000 Units by mouth daily., Disp: , Rfl:   Clinical Interview:   The following information was obtained during a clinical interview  with Ms. Vanhoose prior to cognitive testing.  Cognitive Symptoms: Decreased short-term memory: Endorsed. When asked, she generally described instances of feeling absentminded. Her primary example included not remembering if she had cleaned certain areas of her body while showering. She denied trouble recalling details of conversations, names of familiar individuals, or frequently misplacing objects in her environment. There was no collateral source for additional information. Difficulties were said to have been stable over the past 5-7 years.  Decreased long-term memory: Denied. Decreased attention/concentration: Endorsed. She noted pronounced  difficulty with sustained attention and concentration ("can't do that"). She emphasized that she is unable to read due to a combination of disinterest and inability to focus. Difficulties have been present for about 5-7 years and have seemed stable over time.  Reduced processing speed: Denied. If anything, her mind was said to race.  Difficulties with executive functions: Endorsed. She reported longstanding and unchanged difficulties with organization and indecision. She also reported some instances of impulsivity (e.g., saying something and immediately feeling as though she should not have said it). Overt personality changes were denied.   Difficulties with emotion regulation: Denied. Difficulties with receptive language: Denied. Difficulties with word finding: Denied. Decreased visuoperceptual ability: Denied.  Difficulties completing ADLs: Denied. She currently lives her with mother and denied trouble with medication management, financial management, bill paying, or driving-related responsibilities.   Additional Medical History: History of traumatic brain injury/concussion: Denied. History of stroke: Denied. History of seizure activity: Unclear. When asked, she reported prior seizure activity surrounding significant drops in her blood sugar levels (given that she has type I diabetes). Medical records do appear to confirm this as she had a CT scan back in 2016 which revealed seizure activity. However, there was concern that those seizures could have been influenced by alcohol and other substance use. The frequency or regularity of these seizures was unable to be determined. However, they do not appear to occur frequently.  History of known exposure to toxins: Denied. Symptoms of chronic pain: Endorsed. She reported a history of fibromyalgia and reported diffuse physical pain.  Experience of frequent headaches/migraines: Endorsed. She reported daily, generally mild bitemporal headaches. Headaches  were thought to be partially attributed to tobacco use, as well as anxiety. Medical records also suggest a history of migraine headaches.  Frequent instances of dizziness/vertigo: Denied.  Sensory changes: She wears glasses with benefit. She wondered about potential hearing loss but has not completed formal testing and stating that she "can't afford hearing aids" anyway. Other sensory changes/difficulties (e.g., taste or smell) were denied.  Balance/coordination difficulties: Endorsed. She reported nonspecific balance instability like her "equilibrium is off." She was unsure what could be causing or contributing to this. She did not report any recent falls. Medical records do suggest symptoms of neuropathy in her feet which could contribute.  Other motor difficulties: Denied.  Sleep History: Estimated hours obtained each night: 6-7 hours.  Difficulties falling asleep: Denied with the assistance of trazodone. Difficulties staying asleep: Endorsed. She reported frequently waking throughout the night due to pain/discomfort, particularly involving her arms and shoulders.  Feels rested and refreshed upon awakening: Denied. She reported commonly waking feeling "awful."   History of snoring: Denied. History of waking up gasping for air: Denied. Witnessed breath cessation while asleep: Denied.  History of vivid dreaming: Denied. Excessive movement while asleep: Denied. Instances of acting out her dreams: Denied.  Psychiatric/Behavioral Health History: Depression: Medical records suggest a prior history of both bipolar disorder and severe symptoms of major depressive disorder. She  stated that depression is longstanding in nature, dating back to her diabetic coma when she was 65 years old. She was unsure when she was diagnosed with bipolar disorder or when her last depressive or manic episode was. She described her current mood as "tired and sleepy all the time" but otherwise okay. Current or remote  suicidal ideation, intent, or plan was denied.  Anxiety: Medical records suggest a longstanding history of generalized anxiety disorder. Symptoms were currently said to be present but are managed reasonably well with medications. Ms. Cutbirth also noted that she smokes to help alleviate anxiety.  Mania: Endorsed. As stated above, she has a history of bipolar disorder. She was unsure when her last manic episode occurred.  Trauma History: Denied. Visual/auditory hallucinations: Denied. Delusional thoughts: Denied.  Tobacco: Endorsed. She reported consuming 1.5 to 2 packs of cigarettes per day.  Alcohol: At present, she reported consuming around one beer per night. When asked, she denied a history of problematic alcohol abuse or dependence. However, other medical records suggest a history of prior alcohol abuse while younger. There is also concern of alcohol and substance abuse which may have contributed to potential seizure activity as recent as 2016.  Recreational drugs: Denied. However, this directly contradicts medical records which suggest a prior history of cocaine and opiate abuse. It was unclear when this abuse took place or if Ms. Whitmoyer is currently using/abusing any substances.   Family History: Problem Relation Age of Onset   Arthritis Mother    Colon cancer Mother    Arthritis Father    Kidney disease Father    Kidney cancer Father    Heart attack Brother    Bladder Cancer Sister    Hyperlipidemia Other    Kidney cancer Paternal Aunt    Anxiety disorder Neg Hx    Bipolar disorder Neg Hx    Depression Neg Hx    Breast cancer Neg Hx    Esophageal cancer Neg Hx    Stomach cancer Neg Hx    Rectal cancer Neg Hx    This information was confirmed by Ms. Watanabe.  Academic/Vocational History: Highest level of educational attainment: 12 years. She graduated from high school. She described herself as a somewhat poor (C/D) student in academic settings, stating that these were obtained  grades "when I made the class." Math and reading were both described as relative weaknesses in earlier academic settings. She also reported a strong disdain for the latter subject.  History of developmental delay: Denied. History of grade repetition: Denied. Enrollment in special education courses: Denied. History of LD/ADHD: Denied.  Employment: Disability. She reported receiving disability benefits following her emerging from her diabetic coma when she was 65 years old.   Evaluation Results:   Behavioral Observations: Ms. Bienaime was unaccompanied, arrived to her appointment on time, and was appropriately dressed and groomed. She appeared alert and oriented. Observed gait and station were within normal limits. Gross motor functioning appeared intact upon informal observation and no abnormal movements (e.g., tremors) were noted. Her affect was generally relaxed. However, she did express anxiety and a lack of desire to complete testing. Spontaneous speech was mildly pressured but overall fluent and word finding difficulties were not observed during the clinical interview. Thought processes were coherent, organized, and normal in content. Insight into her cognitive difficulties appeared adequate.   During testing, sustained attention was adequate. Task engagement was limited and testing tolerance was quite poor. She attempted to discontinue the evaluation prematurely after initial learning trials of  memory tasks. When it was explained to her that testing would be invalidated if she did not persist, she agreed to continue. However, after a short time passed, she abruptly expressed her desire to discontinue testing and left the examination room. As such, the evaluation was abbreviated in nature.   Adequacy of Effort: The validity of neuropsychological testing is limited by the extent to which the individual being tested may be assumed to have exerted adequate effort during testing. Ms. Montrose expressed  her intention to perform to the best of her abilities and exhibited adequate task engagement and persistence. Scores across stand-alone and embedded performance validity measures were within expectation. As such, the results of the current evaluation are believed to be a valid representation of Ms. Lantzy's current cognitive functioning.  Test Results: Ms. Wilmot was largely oriented at the time of the current evaluation.  Intellectual abilities based upon educational and vocational attainment were estimated to be in the below average range. Premorbid abilities were estimated to be within the below average range based upon a single-word reading test.   Processing speed was variable, ranging from the exceptionally low to below average normative ranges. Basic attention was well below average. More complex attention (e.g., working memory) was unable to be assessed. Cognitive flexibility was also variable, ranging from the well below average to average normative ranges. Additional aspects of executive functioning were unable to be assessed.   Assessed receptive language abilities were unable to be assessed. Expressive language was mildly variable. Phonemic fluency was exceptionally low, semantic fluency was exceptionally low to well below average, and confrontation naming was also exceptionally low to well below average.     Assessed visuospatial/visuoconstructional abilities were exceptionally low to well below average outside her drawing of a clock. Points were lost on her drawing of a clock due to mild errors in spatial numerical placement, as well as mild errors in hand placement. Points were lost on her copy of a complex figure due to some notable visual distortions, an overall sloppy approach, and one aspect being omitted from the drawing altogether.    Learning (i.e., encoding) of novel verbal information was exceptionally low to well below average. Spontaneous delayed recall (i.e., retrieval) of  previously learned information was well below average to below average. Retention rates were 63% across a story learning task, 50% (raw score of 3) across a list learning task, and 0% across a figure drawing task. Performance across recognition tasks was below average to average, suggesting evidence for information consolidation.   Results of emotional screening instruments suggested that recent symptoms of generalized anxiety were in the severe range, while symptoms of depression were within the moderate range. A screening instrument assessing recent sleep quality suggested the presence of mild sleep dysfunction.  Tables of Scores:   Note: This summary of test scores accompanies the interpretive report and should not be considered in isolation without reference to the appropriate sections in the text. Descriptors are based on appropriate normative data and may be adjusted based on clinical judgment. Terms such as "Within Normal Limits" and "Outside Normal Limits" are used when a more specific description of the test score cannot be determined.       Percentile - Normative Descriptor > 98 - Exceptionally High 91-97 - Well Above Average 75-90 - Above Average 25-74 - Average 9-24 - Below Average 2-8 - Well Below Average < 2 - Exceptionally Low       Validity:   DESCRIPTOR  ACS Word Choice: --- --- Within Normal Limits  Dot Counting Test: --- --- Within Normal Limits  RBANS Effort Index: --- --- Within Normal Limits       Orientation:      Raw Score Percentile   NAB Orientation, Form 1 28/29 --- ---       Cognitive Screening:      Raw Score Percentile   SLUMS: 22/30 --- ---       RBANS, Form A: Standard Score/ Scaled Score Percentile   Total Score 55 <1 Exceptionally Low  Immediate Memory 65 1 Exceptionally Low    List Learning 3 1 Exceptionally Low    Story Memory 5 5 Well Below Average  Visuospatial/Constructional 62 1 Exceptionally Low    Figure Copy 4 2 Well Below Average     Line Orientation 7/20 <2 Exceptionally Low  Language 57 <1 Exceptionally Low    Picture Naming 7/10 <2 Exceptionally Low    Semantic Fluency 3 1 Exceptionally Low  Attention 56 <1 Exceptionally Low    Digit Span 4 2 Well Below Average    Coding 3 1 Exceptionally Low  Delayed Memory 81 10 Below Average    List Recall 3/10 10-16 Below Average    List Recognition 19/20 26-50 Average    Story Recall 5 5 Well Below Average    Story Recognition 9/12 16-26 Below Average    Figure Recall 1 <1 Exceptionally Low    Figure Recognition 4/8 9-20 Below Average       Intellectual Functioning:      Standard Score Percentile   Test of Premorbid Functioning: 84 14 Below Average       Attention/Executive Function:     Trail Making Test (TMT): Raw Score (T Score) Percentile     Part A 49 secs.,  0 errors (40) 16 Below Average    Part B 166 secs.,  5 errors (34) 5 Well Below Average             D-KEFS Verbal Fluency Test: Raw Score (Scaled Score) Percentile     Letter Total Correct 9 (2) <1 Exceptionally Low    Category Total Correct 24 (6) 9 Below Average    Category Switching Total Correct 12 (9) 37 Average    Category Switching Accuracy 11 (10) 50 Average      Total Set Loss Errors 1 (11) 63 Average      Total Repetition Errors 6 (7) 16 Below Average       Language:     Verbal Fluency Test: Raw Score (T Score) Percentile     Phonemic Fluency (FAS) 9 (17) <1 Exceptionally Low    Animal Fluency 11 (31) 3 Well Below Average        NAB Language Module, Form 1: T Score Percentile     Naming 25/31 (31) 3 Well Below Average       Visuospatial/Visuoconstruction:      Raw Score Percentile   Clock Drawing: 8/10 --- Within Normal Limits       Mood and Personality:      Raw Score Percentile   PROMIS Depression Questionnaire: 29 --- Moderate  PROMIS Anxiety Questionnaire: 29 --- Severe       Additional Questionnaires:      Raw Score Percentile   PROMIS Sleep Disturbance Questionnaire:  27 --- Mild   Informed Consent and Coding/Compliance:   The current evaluation represents a clinical evaluation for the purposes previously outlined by the referral source and is in  no way reflective of a forensic evaluation.   Ms. Dewalt was provided with a verbal description of the nature and purpose of the present neuropsychological evaluation. Also reviewed were the foreseeable risks and/or discomforts and benefits of the procedure, limits of confidentiality, and mandatory reporting requirements of this provider. The patient was given the opportunity to ask questions and receive answers about the evaluation. Oral consent to participate was provided by the patient.   This evaluation was conducted by Christia Reading, Ph.D., licensed clinical neuropsychologist. Ms. Trower completed a clinical interview with Dr. Melvyn Novas, billed as one unit [phone], and 95 minutes of cognitive testing and scoring, billed as one unit [phone] and two additional units [id number]. Psychometrist Milana Kidney, B.S., assisted Dr. Melvyn Novas with test administration and scoring procedures. As a separate and discrete service, Dr. Melvyn Novas spent a total of 160 minutes in interpretation and report writing billed as one unit [phone] and two units 96133.

## 2021-03-16 ENCOUNTER — Encounter: Payer: Self-pay | Admitting: Psychology

## 2021-03-21 ENCOUNTER — Other Ambulatory Visit: Payer: Self-pay

## 2021-03-21 ENCOUNTER — Ambulatory Visit (INDEPENDENT_AMBULATORY_CARE_PROVIDER_SITE_OTHER): Payer: Medicare Other | Admitting: Acute Care

## 2021-03-21 ENCOUNTER — Ambulatory Visit
Admission: RE | Admit: 2021-03-21 | Discharge: 2021-03-21 | Disposition: A | Discharge status: Home / Self Care | Payer: Medicare Other | Source: Ambulatory Visit | Attending: Acute Care | Admitting: Acute Care

## 2021-03-21 ENCOUNTER — Encounter: Payer: Self-pay | Admitting: Acute Care

## 2021-03-21 VITALS — BP 122/62 | HR 78 | Temp 97.3°F | Ht 63.5 in | Wt 101.5 lb

## 2021-03-21 DIAGNOSIS — F17210 Nicotine dependence, cigarettes, uncomplicated: Secondary | ICD-10-CM | POA: Diagnosis not present

## 2021-03-21 DIAGNOSIS — Z87891 Personal history of nicotine dependence: Secondary | ICD-10-CM

## 2021-03-21 DIAGNOSIS — E1065 Type 1 diabetes mellitus with hyperglycemia: Secondary | ICD-10-CM

## 2021-03-21 MED ORDER — ACCU-CHEK AVIVA PLUS VI STRP: ORAL_STRIP | 2 refills | Status: DC

## 2021-03-21 NOTE — Patient Instructions (Signed)
Thank you for participating in the [facility]. It was our pleasure to meet you today. We will call you with the results of your scan within the next few days. Your scan will be assigned a Lung RADS category score by the physicians reading the scans.  This Lung RADS score determines follow up scanning.  See below for description of categories, and follow up screening recommendations. We will be in touch to schedule your follow up screening annually or based on recommendations of our providers. We will fax a copy of your scan results to your Primary Care Physician, or the physician who referred you to the program, to ensure they have the results. Please call the office if you have any questions or concerns regarding your scanning experience or results.  Our office number is [phone]. Please speak with Denise Phelps, RN. She is our Lung Cancer Screening RN. If she is unavailable when you call, please have the office staff send her a message. She will return your call at her earliest convenience. Remember, if your scan is normal, we will scan you annually as long as you continue to meet the criteria for the program. (Age 55-77, Current smoker or smoker who has quit within the last 15 years). If you are a smoker, remember, quitting is the single most powerful action that you can take to decrease your risk of lung cancer and other pulmonary, breathing related problems. We know quitting is hard, and we are here to help.  Please let us know if there is anything we can do to help you meet your goal of quitting. If you are a former smoker, congratulations. We are proud of you! Remain smoke free! Remember you can refer friends or family members through the number above.  We will screen them to make sure they meet criteria for the program. Thank you for helping us take better care of you by participating in Lung Screening.  Lung RADS Categories:  Lung RADS 1: no nodules  or definitely non-concerning nodules.  Recommendation is for a repeat annual scan in 12 months.  Lung RADS 2:  nodules that are non-concerning in appearance and behavior with a very low likelihood of becoming an active cancer. Recommendation is for a repeat annual scan in 12 months.  Lung RADS 3: nodules that are probably non-concerning , includes nodules with a low likelihood of becoming an active cancer.  Recommendation is for a 6-month repeat screening scan. Often noted after an upper respiratory illness. We will be in touch to make sure you have no questions, and to schedule your 6-month scan.  Lung RADS 4 A: nodules with concerning findings, recommendation is most often for a follow up scan in 3 months or additional testing based on our provider's assessment of the scan. We will be in touch to make sure you have no questions and to schedule the recommended 3 month follow up scan.  Lung RADS 4 B:  indicates findings that are concerning. We will be in touch with you to schedule additional diagnostic testing based on our provider's  assessment of the scan.   

## 2021-03-21 NOTE — Progress Notes (Signed)
Shared Decision Making Visit Lung Cancer Screening Program [phone])   Eligibility: Age 65 y.o. Pack Years Smoking History Calculation 75 pack year smoking history (# packs/per year x # years smoked) Recent History of coughing up blood  no Unexplained weight loss? no ( >Than 15 pounds within the last 6 months ) Prior History Lung / other cancer no (Diagnosis within the last 5 years already requiring surveillance chest CT Scans). Smoking Status Current Smoker Former Smokers: Years since quit: NA  Quit Date: NA  Visit Components: Discussion included one or more decision making aids. yes Discussion included risk/benefits of screening. yes Discussion included potential follow up diagnostic testing for abnormal scans. yes Discussion included meaning and risk of over diagnosis. yes Discussion included meaning and risk of False Positives. yes Discussion included meaning of total radiation exposure. yes  Counseling Included: Importance of adherence to annual lung cancer LDCT screening. yes Impact of comorbidities on ability to participate in the program. yes Ability and willingness to under diagnostic treatment. yes  Smoking Cessation Counseling: Current Smokers:  Discussed importance of smoking cessation. yes Information about tobacco cessation classes and interventions provided to patient. yes Patient provided with "ticket" for LDCT Scan. yes Symptomatic Patient. no  Counseling NA Diagnosis Code: Tobacco Use Z72.0 Asymptomatic Patient yes  Counseling (Intermediate counseling: > three minutes counseling) [id number] Former Smokers:  Discussed the importance of maintaining cigarette abstinence. yes Diagnosis Code: Personal History of Nicotine Dependence. [id number] Information about tobacco cessation classes and interventions provided to patient. Yes Patient provided with "ticket" for LDCT Scan. yes Written Order for Lung Cancer Screening with LDCT placed in Epic. Yes (CT Chest Lung Cancer  Screening Low Dose W/O CM) [id number] Z12.2-Screening of respiratory organs Z87.891-Personal history of nicotine dependence  I have spent 25 minutes of face to face time with Leslie Lane discussing the risks and benefits of lung cancer screening. We viewed a power point together that explained in detail the above noted topics. We paused at intervals to allow for questions to be asked and answered to ensure understanding.We discussed that the single most powerful action that she can take to decrease her risk of developing lung cancer is to quit smoking. We discussed whether or not she is ready to commit to setting a quit date. We discussed options for tools to aid in quitting smoking including nicotine replacement therapy, non-nicotine medications, support groups, Quit Smart classes, and behavior modification. We discussed that often times setting smaller, more achievable goals, such as eliminating 1 cigarette a day for a week and then 2 cigarettes a day for a week can be helpful in slowly decreasing the number of cigarettes smoked. This allows for a sense of accomplishment as well as providing a clinical benefit. I gave her the " Be Stronger Than Your Excuses" card with contact information for community resources, classes, free nicotine replacement therapy, and access to mobile apps, text messaging, and on-line smoking cessation help. I have also given her my card and contact information in the event she needs to contact me. We discussed the time and location of the scan, and that either Doroteo Glassman RN or I will call with the results within 24-48 hours of receiving them. I have offered her  a copy of the power point we viewed  as a resource in the event they need reinforcement of the concepts we discussed today in the office. The patient verbalized understanding of all of  the above and had no further questions upon leaving the office. They  have my contact information in the event they have any further  questions.  I spent 3 minutes counseling on smoking cessation and the health risks of continued tobacco abuse.  I explained to the patient that there has been a high incidence of coronary artery disease noted on these exams. I explained that this is a non-gated exam therefore degree or severity cannot be determined. This patient is on statin therapy. I have asked the patient to follow-up with their PCP regarding any incidental finding of coronary artery disease and management with diet or medication as their PCP  feels is clinically indicated. The patient verbalized understanding of the above and had no further questions upon completion of the visit.      Magdalen Spatz, NP 03/21/2021

## 2021-03-24 ENCOUNTER — Telehealth: Payer: Self-pay | Admitting: Pharmacist

## 2021-03-24 NOTE — Progress Notes (Signed)
Chronic Care Management Pharmacy Assistant   Name: Leslie Lane  MRN: [id number] DOB: 05-29-1956   Reason for Encounter: Disease State   Conditions to be addressed/monitored: DMII   Recent office visits:  None ID  Recent consult visits:  03/21/21 Magdalen Spatz, NP-Pulmonary (Cigarette) no med changes  03/15/21 Hazle Coca, PhD-Psychology (Mild neurocognitive disorder due to multiple etiologies) no med changes   9/1/22Kumar, Ajay, MD-Endocrinology (Uncontrolled type 1 diabetes mellitus with hyperglycemia) ordered labs, med change:  insulin lispro ([place]) 100 UNIT/ML [facility] visits:  None in previous 6 months  Medications: Outpatient Encounter Medications as of 03/24/2021  Medication Sig Note   Accu-Chek Softclix Lancets lancets Use as instructed to check blood sugar 4 times per day dx code E10.65 12/16/2020: use   Alcohol Swabs (B-D SINGLE USE SWABS REGULAR) PADS USE FOUR TIMES DAILY 12/16/2020: use   aspirin EC 81 MG tablet Take 81 mg by mouth every morning.    Blood Glucose Calibration (ACCU-CHEK AVIVA) SOLN USE AS DIRECTED 12/16/2020: use   Blood Glucose Monitoring Suppl (ACCU-CHEK AVIVA PLUS) w/Device KIT USE AS DIRECTED 12/16/2020: use   diclofenac Sodium (VOLTAREN) 1 % GEL APPLY 2 GRAMS EXTERNALLY TO THE AFFECTED AREA FOUR TIMES DAILY    donepezil (ARICEPT) 10 MG tablet Take 1 tablet every night    escitalopram (LEXAPRO) 10 MG tablet Take 1 tablet (10 mg total) by mouth daily.    gabapentin (NEURONTIN) 100 MG capsule Take 1 to 2 capsules by mouth twice daily.    glucose blood (ACCU-CHEK AVIVA PLUS) test strip TEST BLOOD SUGAR FOUR TIMES DAILY    GVOKE HYPOPEN 2-PACK 1 MG/0.2ML SOAJ INJECT 1 MG UNDER THE SKIN AS NEEDED FOR LOW BLOOD SUGAR    insulin detemir (LEVEMIR FLEXTOUCH) 100 UNIT/ML FlexPen Inject 3 units in the morning and 6 units at bedtime    insulin lispro (HUMALOG KWIKPEN) 100 UNIT/ML KwikPen inject 1-3 UNITS tid for large meals     Insulin Pen Needle 32G X 4 MM MISC Use inject insulin tid    lamoTRIgine (LAMICTAL) 100 MG tablet Take 1 tablet (100 mg total) by mouth 2 (two) times daily.    lidocaine (LIDODERM) 5 % UNWRAP AND APPLY 1 PATCH TO SKIN EVERY DAY(REMOVE AND DISCARD PATCH WITHIN 12 HOURS OR AS DIRECTED BY DOCTOR)    lipase/protease/amylase (CREON) 36000 UNITS CPEP capsule Take 2 capsules (72,000 Units total) by mouth 3 (three) times daily before meals.    loratadine (CLARITIN) 10 MG tablet Take 10 mg by mouth as needed for allergies.    lovastatin (MEVACOR) 40 MG tablet Take 1 tablet (40 mg total) by mouth at bedtime.    Magnesium 500 MG TABS Take 1 tablet by mouth as needed.    metoCLOPramide (REGLAN) 5 MG tablet TAKE 1 TABLET THREE TIMES DAILY BEFORE MEALS AS NEEDED    Multiple Vitamin (MULTIVITAMIN) tablet Take 1 tablet by mouth daily. Reported on 11/22/2015 09/20/2020: Taking gummy vitamins   Omega-3 Fatty Acids (FISH OIL) 1000 MG CAPS Take 1 capsule by mouth daily.    omeprazole (PRILOSEC) 40 MG capsule TAKE ONE CAPSULE BY MOUTH ONCE DAILY    traZODone (DESYREL) 100 MG tablet Take 4 tablets (400 mg total) by mouth at bedtime.    umeclidinium-vilanterol (ANORO ELLIPTA) 62.5-25 MCG/INH AEPB Inhale 1 puff into the lungs daily.    vitamin B-12 (CYANOCOBALAMIN) 1000 MCG tablet Take 1 tablet by mouth daily.    vitamin E 1000 UNIT capsule Take 1,000  Units by mouth daily.    No facility-administered encounter medications on file as of 03/24/2021.     Recent Relevant Labs: Lab Results  Component Value Date/Time   HGBA1C 6.6 (H) 02/28/2021 10:54 AM   HGBA1C 6.4 (A) 12/16/2020 03:48 PM   HGBA1C 6.8 (H) 09/16/2020 10:36 AM   MICROALBUR 1.0 06/10/2020 01:43 PM   MICROALBUR 1.9 09/16/2019 10:28 AM    Kidney Function Lab Results  Component Value Date/Time   CREATININE 0.79 02/28/2021 10:54 AM   CREATININE 0.77 09/16/2020 10:36 AM   GFR 77.71 02/28/2021 10:54 AM   GFRNONAA >60 05/02/2015 10:14 PM   GFRAA >60  05/02/2015 10:14 PM     Contacted patient on 03/24/21 to discuss diabetes disease state.   Current antihyperglycemic regimen:  Humalog inject 1-3 units tid Levemir 100 units inject 3 units am and 6 units bedtime   Patient verbally confirms she is taking the above medications as directed. Yes  What diet changes have been made to improve diabetes control? No  What recent interventions/DTPs have been made to improve glycemic control:  None noted  Have there been any recent hospitalizations or ED visits since last visit with CPP? No  Patient reports hypoglycemic readings over below 70. She states that she gets really cold when that happens.  Patient reports readings over 250 at times, hyperglycemic symptoms, including fatigue and hot flashes when it is up to high  How often are you checking your blood sugar? 3-4 times daily  What are your blood sugars ranging? 80-200 Fasting: 133 waking up After meals: 181 lunch  During the week, how often does your blood glucose drop below 70?  Patient states that she has had a few readings below 70  Are you checking your feet daily/regularly? Yes, but only deals with calculus   Adherence Review: Is the patient currently on a STATIN medication? Yes Is the patient currently on ACE/ARB medication? No Does the patient have >5 day gap between last estimated fill dates? No  Care Gaps: Annual wellness visit in last year? No Most Recent BP reading:122/62 03/18/21  If Diabetic: Most recent A1C reading:6.6 02/28/21 Last eye exam / retinopathy screening:01/07/21 Last diabetic foot exam:06/10/20  Star Rating Drugs:  Medication:  Last Fill: Day Supply Lovastatin 40 mg 01/05/21  90  Endocrinology appointment on 06/09/21    [place] Pharmacist Assistant [phone]   Time spent:40

## 2021-03-29 ENCOUNTER — Ambulatory Visit (INDEPENDENT_AMBULATORY_CARE_PROVIDER_SITE_OTHER): Payer: Medicare Other | Admitting: Psychology

## 2021-03-29 ENCOUNTER — Other Ambulatory Visit: Payer: Self-pay

## 2021-03-29 DIAGNOSIS — G3184 Mild cognitive impairment, so stated: Secondary | ICD-10-CM

## 2021-03-29 DIAGNOSIS — F331 Major depressive disorder, recurrent, moderate: Secondary | ICD-10-CM | POA: Diagnosis not present

## 2021-03-29 DIAGNOSIS — F319 Bipolar disorder, unspecified: Secondary | ICD-10-CM | POA: Diagnosis not present

## 2021-03-29 DIAGNOSIS — F411 Generalized anxiety disorder: Secondary | ICD-10-CM

## 2021-03-29 DIAGNOSIS — F0670 Mild neurocognitive disorder due to known physiological condition without behavioral disturbance: Secondary | ICD-10-CM

## 2021-03-29 NOTE — Progress Notes (Signed)
   Neuropsychology Feedback Session Leslie Lane. [facility] Neu[facility]  Reason for Referral:   Leslie Lane is a 65 y.o. right-handed Caucasian female referred by  Sharene Butters, PA-C , to characterize her current cognitive functioning and assist with diagnostic clarity and treatment planning in the context of subjective cognitive decline and numerous medical and psychiatric comorbidities.   Feedback:   [name] completed a comprehensive neuropsychological evaluation on 03/15/2021. Please refer to that encounter for the full report and recommendations. Briefly, [name] exhibited very limited testing tolerance and abruptly discontinued the evaluation prematurely. Unfortunately, given the abbreviated nature of testing, several domains were unable to be thoroughly assessed and the current conceptualization is limited by this. [name] pattern of performance is suggestive of prominent difficulties with basic attention, expressive language, visuospatial abilities, and encoding (i.e., learning) aspects of memory. Overall, remote substance abuse history, as well as chronic and acute psychiatric distress, is very likely exacerbating deficits related to various vascular conditions. Ongoing sleep dysfunction, chronic pain, and frequent headaches would also contribute to dysfunction.   [name] was unaccompanied during the current feedback appointment. Content of the current session focused on the results of her neuropsychological evaluation. [name] was given the opportunity to ask questions and her questions were answered. She was encouraged to reach out should additional questions arise. A copy of her report was provided at the conclusion of the visit.      17 minutes were spent conducting the current feedback session with [name], billed as one unit [phone].

## 2021-03-30 ENCOUNTER — Encounter: Payer: Medicare Other | Admitting: Psychology

## 2021-03-31 DIAGNOSIS — E1043 Type 1 diabetes mellitus with diabetic autonomic (poly)neuropathy: Secondary | ICD-10-CM | POA: Diagnosis not present

## 2021-03-31 DIAGNOSIS — E1065 Type 1 diabetes mellitus with hyperglycemia: Secondary | ICD-10-CM | POA: Diagnosis not present

## 2021-03-31 DIAGNOSIS — Z794 Long term (current) use of insulin: Secondary | ICD-10-CM | POA: Diagnosis not present

## 2021-04-11 ENCOUNTER — Telehealth: Payer: Self-pay

## 2021-04-11 NOTE — Progress Notes (Signed)
Chronic Care Management Pharmacy Assistant   Name: Leslie Lane  MRN: [id number] DOB: 11/08/1955   Reason for Encounter: Disease State   Conditions to be addressed/monitored: HLD   Recent office visits:  None ID  Recent consult visits:  03/29/21 Leslie Coca, PhD-Psychology (Mild neurocognitive disorder due to multiple etiologies)   Hospital visits:  None since last coordination call  Medications: Outpatient Encounter Medications as of 04/11/2021  Medication Sig Note   Accu-Chek Softclix Lancets lancets Use as instructed to check blood sugar 4 times per day dx code E10.65 12/16/2020: use   Alcohol Swabs (B-D SINGLE USE SWABS REGULAR) PADS USE FOUR TIMES DAILY 12/16/2020: use   aspirin EC 81 MG tablet Take 81 mg by mouth every morning.    Blood Glucose Calibration (ACCU-CHEK AVIVA) SOLN USE AS DIRECTED 12/16/2020: use   Blood Glucose Monitoring Suppl (ACCU-CHEK AVIVA PLUS) w/Device KIT USE AS DIRECTED 12/16/2020: use   diclofenac Sodium (VOLTAREN) 1 % GEL APPLY 2 GRAMS EXTERNALLY TO THE AFFECTED AREA FOUR TIMES DAILY    donepezil (ARICEPT) 10 MG tablet Take 1 tablet every night    escitalopram (LEXAPRO) 10 MG tablet Take 1 tablet (10 mg total) by mouth daily.    gabapentin (NEURONTIN) 100 MG capsule Take 1 to 2 capsules by mouth twice daily.    glucose blood (ACCU-CHEK AVIVA PLUS) test strip TEST BLOOD SUGAR FOUR TIMES DAILY    GVOKE HYPOPEN 2-PACK 1 MG/0.2ML SOAJ INJECT 1 MG UNDER THE SKIN AS NEEDED FOR LOW BLOOD SUGAR    insulin detemir (LEVEMIR FLEXTOUCH) 100 UNIT/ML FlexPen Inject 3 units in the morning and 6 units at bedtime    insulin lispro (HUMALOG KWIKPEN) 100 UNIT/ML KwikPen inject 1-3 UNITS tid for large meals    Insulin Pen Needle 32G X 4 MM MISC Use inject insulin tid    lamoTRIgine (LAMICTAL) 100 MG tablet Take 1 tablet (100 mg total) by mouth 2 (two) times daily.    lidocaine (LIDODERM) 5 % UNWRAP AND APPLY 1 PATCH TO SKIN EVERY DAY(REMOVE AND DISCARD PATCH  WITHIN 12 HOURS OR AS DIRECTED BY DOCTOR)    lipase/protease/amylase (CREON) 36000 UNITS CPEP capsule Take 2 capsules (72,000 Units total) by mouth 3 (three) times daily before meals.    loratadine (CLARITIN) 10 MG tablet Take 10 mg by mouth as needed for allergies.    lovastatin (MEVACOR) 40 MG tablet Take 1 tablet (40 mg total) by mouth at bedtime.    Magnesium 500 MG TABS Take 1 tablet by mouth as needed.    metoCLOPramide (REGLAN) 5 MG tablet TAKE 1 TABLET THREE TIMES DAILY BEFORE MEALS AS NEEDED    Multiple Vitamin (MULTIVITAMIN) tablet Take 1 tablet by mouth daily. Reported on 11/22/2015 09/20/2020: Taking gummy vitamins   Omega-3 Fatty Acids (FISH OIL) 1000 MG CAPS Take 1 capsule by mouth daily.    omeprazole (PRILOSEC) 40 MG capsule TAKE ONE CAPSULE BY MOUTH ONCE DAILY    traZODone (DESYREL) 100 MG tablet Take 4 tablets (400 mg total) by mouth at bedtime.    umeclidinium-vilanterol (ANORO ELLIPTA) 62.5-25 MCG/INH AEPB Inhale 1 puff into the lungs daily.    vitamin B-12 (CYANOCOBALAMIN) 1000 MCG tablet Take 1 tablet by mouth daily.    vitamin E 1000 UNIT capsule Take 1,000 Units by mouth daily.    No facility-administered encounter medications on file as of 04/11/2021.   04/11/2021 Name: Leslie Lane MRN: [id number] DOB: December 17, 1955 Leslie Lane is a 65 y.o. year old  female who is a primary care patient of Leslie Koch, MD.  Comprehensive medication review performed; Spoke to patient regarding cholesterol  Lipid Panel    Component Value Date/Time   CHOL 181 09/16/2020 1036   TRIG 64.0 09/16/2020 1036   HDL 84.40 09/16/2020 1036   LDLCALC 84 09/16/2020 1036   LDLDIRECT 101.7 03/26/2012 1048    10-year ASCVD risk score: The 10-year ASCVD risk score (Arnett DK, et al., 2019) is: 15.3%   Values used to calculate the score:     Age: 65 years     Sex: Female     Is Non-Hispanic African American: No     Diabetic: Yes     Tobacco smoker: Yes     Systolic Blood Pressure:  122 mmHg     Is BP treated: No     HDL Cholesterol: 84.4 mg/dL     Total Cholesterol: 181 mg/dL  Current antihyperlipidemic regimen:  Lovastatin 40 mg Previous antihyperlipidemic medications tried: none noted ASCVD risk enhancing conditions: age >32 What recent interventions/DTPs have been made by any provider to improve Cholesterol control since last CPP Visit: none noted Any recent hospitalizations or ED visits since last visit with CPP? No What diet changes have been made to improve Cholesterol?  Patient states that she eats what she wants, has not tried to change diet What exercise is being done to improve Cholesterol?  Patient is active with walking  Adherence Review: Does the patient have >5 day gap between last estimated fill dates? Yes, gave patient number to [facility] to call for refill  Care Gaps: Colonoscopy-05/03/17 Diabetic Foot Exam-06/10/20 Mammogram-09/30/20 Ophthalmology-01/07/21 Dexa Scan - NA Annual Well Visit -  Micro albumin-NA Hemoglobin A1c- 02/28/21  Star Rating Drugs:  Medication:                Last Fill:         Day Supply Lovastatin 40 mg        01/05/21              [place] Clinical Pharmacist Assistant [phone]

## 2021-04-12 NOTE — Progress Notes (Signed)
Please call patient and let them  know their  low dose Ct was read as a Lung RADS 2: nodules that are benign in appearance and behavior with a very low likelihood of becoming a clinically active cancer due to size or lack of growth. Recommendation per radiology is for a repeat LDCT in 12 months. .Please let them  know we will order and schedule their  annual screening scan for 03/2022. Please let them  know there was notation of CAD on their  scan.  Please remind the patient  that this is a non-gated exam therefore degree or severity of disease  cannot be determined. Please have them  follow up with their PCP regarding potential risk factor modification, dietary therapy or pharmacologic therapy if clinically indicated. Pt.  is  currently on statin therapy. Please place order for annual  screening scan for  03/2022 and fax results to PCP. Thanks so much.  Please Ask her if she has had any stomach issues, or if she has a history of gastritis. She may need a referral to GI per her PCP. Have her follow up with PCP. Thanks so much.

## 2021-04-13 ENCOUNTER — Other Ambulatory Visit: Payer: Self-pay | Admitting: *Deleted

## 2021-04-13 DIAGNOSIS — F17210 Nicotine dependence, cigarettes, uncomplicated: Secondary | ICD-10-CM

## 2021-04-13 DIAGNOSIS — Z87891 Personal history of nicotine dependence: Secondary | ICD-10-CM

## 2021-04-18 ENCOUNTER — Other Ambulatory Visit: Payer: Self-pay

## 2021-04-18 MED ORDER — DONEPEZIL HCL 10 MG PO TABS: ORAL_TABLET | ORAL | 0 refills | Status: DC

## 2021-04-21 ENCOUNTER — Other Ambulatory Visit: Payer: Self-pay | Admitting: Internal Medicine

## 2021-04-28 ENCOUNTER — Ambulatory Visit (INDEPENDENT_AMBULATORY_CARE_PROVIDER_SITE_OTHER): Payer: Medicare Other | Admitting: Internal Medicine

## 2021-04-28 ENCOUNTER — Telehealth (HOSPITAL_BASED_OUTPATIENT_CLINIC_OR_DEPARTMENT_OTHER): Payer: Medicare Other | Admitting: Psychiatry

## 2021-04-28 ENCOUNTER — Other Ambulatory Visit: Payer: Self-pay

## 2021-04-28 ENCOUNTER — Encounter: Payer: Self-pay | Admitting: Internal Medicine

## 2021-04-28 VITALS — BP 130/70 | HR 65 | Resp 18 | Ht 63.5 in | Wt 101.2 lb

## 2021-04-28 DIAGNOSIS — G4701 Insomnia due to medical condition: Secondary | ICD-10-CM

## 2021-04-28 DIAGNOSIS — K3184 Gastroparesis: Secondary | ICD-10-CM

## 2021-04-28 DIAGNOSIS — E1043 Type 1 diabetes mellitus with diabetic autonomic (poly)neuropathy: Secondary | ICD-10-CM | POA: Diagnosis not present

## 2021-04-28 DIAGNOSIS — K8689 Other specified diseases of pancreas: Secondary | ICD-10-CM | POA: Diagnosis not present

## 2021-04-28 DIAGNOSIS — I700 Atherosclerosis of aorta: Secondary | ICD-10-CM

## 2021-04-28 DIAGNOSIS — R109 Unspecified abdominal pain: Secondary | ICD-10-CM

## 2021-04-28 DIAGNOSIS — Z23 Encounter for immunization: Secondary | ICD-10-CM

## 2021-04-28 DIAGNOSIS — E108 Type 1 diabetes mellitus with unspecified complications: Secondary | ICD-10-CM | POA: Diagnosis not present

## 2021-04-28 DIAGNOSIS — R1084 Generalized abdominal pain: Secondary | ICD-10-CM | POA: Diagnosis not present

## 2021-04-28 DIAGNOSIS — G8929 Other chronic pain: Secondary | ICD-10-CM

## 2021-04-28 DIAGNOSIS — F332 Major depressive disorder, recurrent severe without psychotic features: Secondary | ICD-10-CM

## 2021-04-28 DIAGNOSIS — F4010 Social phobia, unspecified: Secondary | ICD-10-CM

## 2021-04-28 MED ORDER — OMEPRAZOLE 40 MG PO CPDR: 40.0000 mg | DELAYED_RELEASE_CAPSULE | Freq: Two times a day (BID) | ORAL | 0 refills | Status: DC

## 2021-04-28 MED ORDER — PANCRELIPASE (LIP-PROT-AMYL) 36000-114000 UNITS PO CPEP: 72000.0000 [IU] | ORAL_CAPSULE | Freq: Three times a day (TID) | ORAL | 3 refills | Status: DC

## 2021-04-28 MED ORDER — DICYCLOMINE HCL 10 MG PO CAPS: 10.0000 mg | ORAL_CAPSULE | Freq: Three times a day (TID) | ORAL | 3 refills | Status: DC

## 2021-04-28 MED ORDER — MIRTAZAPINE 15 MG PO TABS: 15.0000 mg | ORAL_TABLET | Freq: Every day | ORAL | 0 refills | Status: DC

## 2021-04-28 MED ORDER — LAMOTRIGINE 100 MG PO TABS: 100.0000 mg | ORAL_TABLET | Freq: Two times a day (BID) | ORAL | 0 refills | Status: DC

## 2021-04-28 NOTE — Patient Instructions (Addendum)
We will make a couple of changes to help the diarrhea and stomach pain.  For the diarrhea we have sent in creon (pancreatic enzymes) that you take 1 pill with snacks and 2 pills with meals. Take them right as you are eating to help decrease or eliminate the diarrhea.  We have sent in bentyl to use as needed for pain.  We will have you double the dose of omeprazole to 1 pill twice a day.  We will get you back in with the GI doctor to check out the cause.

## 2021-04-28 NOTE — Progress Notes (Signed)
   Subjective:   Patient ID: Leslie Lane, female    DOB: 06-07-1956, 65 y.o.   MRN: [id number]  HPI The patient is a 65 YO female coming in for diarrhea and abdominal pain.  Review of Systems  Constitutional:  Positive for appetite change.  HENT: Negative.    Eyes: Negative.   Respiratory:  Negative for cough, chest tightness and shortness of breath.   Cardiovascular:  Negative for chest pain, palpitations and leg swelling.  Gastrointestinal:  Positive for abdominal distention, abdominal pain and diarrhea. Negative for anal bleeding, blood in stool, constipation, nausea and vomiting.  Musculoskeletal: Negative.   Skin: Negative.   Neurological: Negative.   Psychiatric/Behavioral: Negative.     Objective:  Physical Exam Constitutional:      Appearance: She is well-developed.     Comments: Thin and chronically ill appearing  HENT:     Head: Normocephalic and atraumatic.  Cardiovascular:     Rate and Rhythm: Normal rate and regular rhythm.  Pulmonary:     Effort: Pulmonary effort is normal. No respiratory distress.     Breath sounds: Normal breath sounds. No wheezing or rales.  Abdominal:     General: Bowel sounds are normal. There is no distension.     Palpations: Abdomen is soft.     Tenderness: There is no abdominal tenderness. There is no rebound.  Musculoskeletal:     Cervical back: Normal range of motion.  Skin:    General: Skin is warm and dry.  Neurological:     Mental Status: She is alert and oriented to person, place, and time.     Coordination: Coordination normal.    Vitals:   04/28/21 1100  BP: 130/70  Pulse: 65  Resp: 18  SpO2: 94%  Weight: 101 lb 3.2 oz (45.9 kg)  Height: 5' 3.5" (1.613 m)    This visit occurred during the SARS-CoV-2 public health emergency.  Safety protocols were in place, including screening questions prior to the visit, additional usage of staff PPE, and extensive cleaning of exam room while observing appropriate contact time as  indicated for disinfecting solutions.   Assessment & Plan:  Flu shot given at visit.

## 2021-04-28 NOTE — Progress Notes (Signed)
Virtual Visit via Telephone Note  I connected with Leslie Lane on 04/28/21 at 10:00 AM EDT by telephone and verified that I am speaking with the correct person using two identifiers. We were unable to connect by video despite a few attempts.   Location: Patient: home Provider: office   I discussed the limitations, risks, security and privacy concerns of performing an evaluation and management service by telephone and the availability of in person appointments. I also discussed with the patient that there may be a patient responsible charge related to this service. The patient expressed understanding and agreed to proceed.   History of Present Illness: "I'm not doing well mentally". Leslie Lane had a fight with her brother and now her anxiety is very high. On days she does not have to see her brother she is nervous, sick to her stomach, unmotivated and restless. Leslie Lane dreads the days she has to see him. She will often leave the house and go out somewhere to walk to get away from him. If she is home then she avoids him by staying in her room. Her depression is also worse. She sleeps with Trazodone but her appetite and energy are poor. Her concentration is poor and she can no longer focus on tv. She almost feels apathetic. Leslie Lane is crying more often especially when she starts thinking about things she wished she was unmotivated to do. She denies SI/HI. Leslie Lane is losing weight and is having diarrhea and is going to her PCP today for evaluation. She has to force herself to get ready. Most days she is forcing herself to shower, brush her teeth and change.    Observations/Objective:  General Appearance: unable to assess  Eye Contact:  unable to assess  Speech:  Clear and Coherent and Slow  Volume:  Normal  Mood:  Anxious and Depressed  Affect:  Congruent  Thought Process:  Coherent, Linear, and Descriptions of Associations: Intact  Orientation:  Full (Time, Place, and Person)  Thought Content:  Logical   Suicidal Thoughts:  No  Homicidal Thoughts:  No  Memory:  Immediate;   Good  Judgement:  Fair  Insight:  Present  Psychomotor Activity: unable to assess  Concentration:  Concentration: Fair  Recall:  Leslie Lane of Knowledge:  Good  Language:  Good  Akathisia:  unable to assess  Handed:  unable to assess  AIMS (if indicated):     Assets:  Communication Skills Desire for Improvement Financial Resources/Insurance Housing Social Support Talents/Skills Transportation Vocational/Educational  ADL's:  unable to assess  Cognition:  WNL  Sleep:        Assessment and Plan: Depression screen [facility] 2/9 04/28/2021 12/30/2020 12/16/2020 09/02/2020 06/10/2020  Decreased Interest 3 3 3 2  0  Down, Depressed, Hopeless 3 3 3 2  0  PHQ - 2 Score 6 6 6 4  0  Altered sleeping 0 3 3 2  0  Tired, decreased energy 3 3 2 3  0  Change in appetite 3 3 1 3  0  Feeling bad or failure about yourself  3 3 3 2  0  Trouble concentrating 3 3 3 3  0  Moving slowly or fidgety/restless 3 0 1 1 0  Suicidal thoughts 0 3 2 0 0  PHQ-9 Score 21 24 21 18  0  Difficult doing work/chores Extremely dIfficult Extremely dIfficult - Extremely dIfficult -  Some recent data might be hidden    Flowsheet Row Video Visit from 04/28/2021 in [facility] Video Visit from 12/30/2020 [facility] Video Visit from 09/02/2020 in [facility]  C-SSRS RISK CATEGORY No Risk Error: Q3, 4, or 5 should not be populated when Q2 is No No Risk      1. Major depressive disorder, recurrent, severe without psychotic features ([place]) - lamoTRIgine (LAMICTAL) 100 MG tablet; Take 1 tablet (100 mg total) by mouth 2 (two) times daily.  Dispense: 180 tablet; Refill: 0 - mirtazapine (REMERON) 15 MG tablet; Take 1 tablet (15 mg total) by mouth at bedtime.  Dispense: 30 tablet; Refill: 0  2. Social anxiety disorder - lamoTRIgine (LAMICTAL) 100  MG tablet; Take 1 tablet (100 mg total) by mouth 2 (two) times daily.  Dispense: 180 tablet; Refill: 0  3. Insomnia due to medical condition - mirtazapine (REMERON) 15 MG tablet; Take 1 tablet (15 mg total) by mouth at bedtime.  Dispense: 30 tablet; Refill: 0  D/c Lexapro due to SE  She is reluctant to make any med changes or start any medications.she is willing to do a trial with Remeron.  D/c Trazodone  Start trial of Remeron 15mg  po qHS for depression and anxiety  Follow Up Instructions: In 1-2 weeks or sooner if needed   I discussed the assessment and treatment plan with the patient. The patient was provided an opportunity to ask questions and all were answered. The patient agreed with the plan and demonstrated an understanding of the instructions.   The patient was advised to call back or seek an in-person evaluation if the symptoms worsen or if the condition fails to improve as anticipated.  I provided 22 minutes of non-face-to-face time during this encounter.   Charlcie Cradle, MD

## 2021-04-29 DIAGNOSIS — I700 Atherosclerosis of aorta: Secondary | ICD-10-CM | POA: Insufficient documentation

## 2021-04-29 DIAGNOSIS — K8689 Other specified diseases of pancreas: Secondary | ICD-10-CM | POA: Insufficient documentation

## 2021-04-29 NOTE — Assessment & Plan Note (Addendum)
Referral back to GI for pain. Prior imaging abdomen and pelvis without acute findings. Recent CT chest reviewed with calcifications in aorta as well as gastritis possibly. Rx omeprazole BID instead of daily to help. Rx bentyl. Most likely possibilities include worsening gastroparesis and potentially chronic mesenteric ischemia. Pain worse with eating. Talked with her about these possibilities and she may need CT angio to assess for blockages in the intestines. We are also resuming creon for pancreatic insufficiency which could be contributing to symptoms.

## 2021-04-29 NOTE — Assessment & Plan Note (Signed)
Likely cause of her persistent diarrhea. She is not taking creon which she has been prescribed in the past. Counseled about need to take 1 with snacks and 2 with meals about 5 minutes before or with meal. Rx done today.

## 2021-04-29 NOTE — Assessment & Plan Note (Signed)
Reviewed findings from recent CT scan lungs with her. She is taking lovastatin which she should continue.

## 2021-04-29 NOTE — Assessment & Plan Note (Signed)
Concern for gastroparesis worsening. She is already taking reglan 5 mg TID so increase is not appropriate today. She is not taking creon and likely has significant pancreatic insufficiency from prolonged type 1 diabetes.

## 2021-05-01 DIAGNOSIS — Z794 Long term (current) use of insulin: Secondary | ICD-10-CM | POA: Diagnosis not present

## 2021-05-01 DIAGNOSIS — E1043 Type 1 diabetes mellitus with diabetic autonomic (poly)neuropathy: Secondary | ICD-10-CM | POA: Diagnosis not present

## 2021-05-05 ENCOUNTER — Telehealth (HOSPITAL_BASED_OUTPATIENT_CLINIC_OR_DEPARTMENT_OTHER): Payer: Medicare Other | Admitting: Psychiatry

## 2021-05-05 ENCOUNTER — Other Ambulatory Visit: Payer: Self-pay

## 2021-05-05 DIAGNOSIS — F4010 Social phobia, unspecified: Secondary | ICD-10-CM

## 2021-05-05 DIAGNOSIS — G4701 Insomnia due to medical condition: Secondary | ICD-10-CM

## 2021-05-05 DIAGNOSIS — F332 Major depressive disorder, recurrent severe without psychotic features: Secondary | ICD-10-CM

## 2021-05-05 NOTE — Progress Notes (Signed)
Virtual Visit via Telephone Note  I connected with Leslie Lane on 05/05/21 at  2:45 PM EDT by telephone and verified that I am speaking with the correct person using two identifiers.  Location: Patient: driving in car Provider: office   I discussed the limitations, risks, security and privacy concerns of performing an evaluation and management service by telephone and the availability of in person appointments. I also discussed with the patient that there may be a patient responsible charge related to this service. The patient expressed understanding and agreed to proceed.   History of Present Illness: "I think I am doing better". Her PCP gave her something to help her stomach. Cyndia still needs to follow up with her GI doctor.  Her energy and motivation are low but she forces herself to do it reluctantly. She has not yet started Remeron because she has been drinking 1 beer/night. Janyia is worried that it will make her dizzy. Her depression is "the same. I don't think I will ever feel good". She believes her medical problems contribute greatly to her mood. Her appetite is fine. She is taking Trazodone to sleep. She gets about 6-7 hrs and 1 hr nap during the daytime. Jayleah denies SI/HI. Foye is willing to try Remeron and will start it tonight.    Observations/Objective:  General Appearance: unable to assess  Eye Contact:  unable to assess  Speech:  Clear and Coherent and Normal Rate  Volume:  Normal  Mood:  Anxious and Depressed  Affect:  Congruent  Thought Process:  Goal Directed, Linear, and Descriptions of Associations: Intact  Orientation:  Full (Time, Place, and Person)  Thought Content:  Logical  Suicidal Thoughts:  No  Homicidal Thoughts:  No  Memory:  Immediate;   Fair  Judgement:  Fair  Insight:  Present  Psychomotor Activity: unable to assess  Concentration:  Concentration: Fair  Recall:  [employer] of Knowledge:  Good  Language:  Good  Akathisia:  unable to assess   Handed:  unable to assess  AIMS (if indicated):     Assets:  Communication Skills Desire for Improvement Financial Resources/Insurance Housing Talents/Skills Transportation Vocational/Educational  ADL's:  unable to assess  Cognition:  WNL  Sleep:        Assessment and Plan:  Nahdia has not started the Remeron but is willing to try it starting tonight.  D/c Trazodone.- Caitlin verbalized understanding  I cautioned about drinking alcohol each night and the effect it could have on meds and health. Vernice verbalized understanding.  Continue Lamictal  1. Major depressive disorder, recurrent, severe without psychotic features ([place])  2. Insomnia due to medical condition  3. Social anxiety disorder   Follow Up Instructions: In 2-3 weeks or sooner if needed   I discussed the assessment and treatment plan with the patient. The patient was provided an opportunity to ask questions and all were answered. The patient agreed with the plan and demonstrated an understanding of the instructions.   The patient was advised to call back or seek an in-person evaluation if the symptoms worsen or if the condition fails to improve as anticipated.  I provided 7 minutes of non-face-to-face time during this encounter.   Charlcie Cradle, MD

## 2021-05-10 ENCOUNTER — Other Ambulatory Visit: Payer: Self-pay | Admitting: Endocrinology

## 2021-05-10 DIAGNOSIS — E1065 Type 1 diabetes mellitus with hyperglycemia: Secondary | ICD-10-CM

## 2021-05-19 ENCOUNTER — Other Ambulatory Visit: Payer: Self-pay | Admitting: Endocrinology

## 2021-05-20 ENCOUNTER — Other Ambulatory Visit: Payer: Self-pay | Admitting: Endocrinology

## 2021-05-30 ENCOUNTER — Ambulatory Visit (INDEPENDENT_AMBULATORY_CARE_PROVIDER_SITE_OTHER): Payer: Medicare Other

## 2021-05-30 ENCOUNTER — Other Ambulatory Visit: Payer: Self-pay

## 2021-05-30 DIAGNOSIS — E108 Type 1 diabetes mellitus with unspecified complications: Secondary | ICD-10-CM

## 2021-05-30 DIAGNOSIS — F17210 Nicotine dependence, cigarettes, uncomplicated: Secondary | ICD-10-CM

## 2021-05-30 DIAGNOSIS — F332 Major depressive disorder, recurrent severe without psychotic features: Secondary | ICD-10-CM

## 2021-05-30 DIAGNOSIS — F3132 Bipolar disorder, current episode depressed, moderate: Secondary | ICD-10-CM

## 2021-05-30 NOTE — Patient Instructions (Signed)
Visit Information  Following are the goals we discussed today:   Manage My Medications  Timeframe:  Long-Range Goal Priority:  High Start Date: 05/30/2021                            Expected End Date: 05/30/2022                      Follow Up Date 11/27/2021   - check blood sugar at prescribed times - check blood sugar before and after exercise - check blood sugar if I feel it is too high or too low - enter blood sugar readings and medication or insulin into daily log - take the blood sugar log to all doctor visits - take the blood sugar meter to all doctor visits    Why is this important?   Checking your blood sugar at home helps to keep it from getting very high or very low.  Writing the results in a diary or log helps the doctor know how to care for you.  Your blood sugar log should have the time, the date and the results.  Also, write down the amount of insulin or other medicine you take.  Other information like what you ate, exercise done and how you were feeling will also be helpful.  Plan: Telephone follow up appointment with care management team member scheduled for:  6 months The patient has been provided with contact information for the care management team and has been advised to call with any health related questions or concerns.   Tomasa Blase, PharmD Clinical Pharmacist, [facility]   Please call the care guide team at [phone] if you need to cancel or reschedule your appointment.   The patient verbalized understanding of instructions, educational materials, and care plan provided today and declined offer to receive copy of patient instructions, educational materials, and care plan.

## 2021-05-30 NOTE — Progress Notes (Signed)
Chronic Care Management Pharmacy Note  05/30/2021 Name:  Leslie Lane MRN:  [id number] DOB:  29-Apr-1956  Summary: - Patient reports that she was started on Anoro for her chronic cough since last CCM appointment,  reports that it is helpful in controlling cost, no issues with copay -Continuously monitors blood sugars, typically averaging <170 if she is watching her diet, if she has over eaten can be >200, reports to occasional lows which she corrects with fruit, cookies -Reports to compliance with creon since last appointment with PCP - notes to continued abdominal pain / diarrhea persists, appears she has missed the phone call outreach to schedule with GI -Reports that she is not interested in smoking cessation at this time, notes that she is aware of the detrimental effects it has on her health  Recommendations/Changes made from today's visit: -Recommending no changes to medications at this time, patient was given phone number to gastro office so that she can schedule appointment   Subjective: Leslie Lane is an 65 y.o. year old female who is a primary patient of [name], MD.  The CCM team was consulted for assistance with disease management and care coordination needs.    Engaged with patient by telephone for follow up visit in response to provider referral for pharmacy case management and/or care coordination services.   Consent to Services:  The patient was given the following information about Chronic Care Management services today, agreed to services, and gave verbal consent: 1. CCM service includes personalized support from designated clinical staff supervised by the primary care provider, including individualized plan of care and coordination with other care providers 2. 24/7 contact phone numbers for assistance for urgent and routine care needs. 3. Service will only be billed when office clinical staff spend 20 minutes or more in a month to coordinate care. 4. Only  one practitioner may furnish and bill the service in a calendar month. 5.The patient may stop CCM services at any time (effective at the end of the month) by phone call to the office staff. 6. The patient will be responsible for cost sharing (co-pay) of up to 20% of the service fee (after annual deductible is met). Patient agreed to services and consent obtained.  Patient Care Team: [name], MD as PCP - General (Internal Medicine) [name], MD as Consulting Physician (Endocrinology) Amil Amen, MD as Consulting Physician (Psychiatry) Ricard Dillon, MD as Consulting Physician (Psychiatry) Ladene Artist, MD (Gastroenterology) Franchot Gallo, MD (Urology) Cameron Sprang, MD as Consulting Physician (Neurology) Charlton Haws, [facility] as Pharmacist (Pharmacist)  Recent office visits: 04/28/2021 - Dr. Sharlet Salina - evaluation of GI pain - referral back to GI for pain  - nonadherent with creon  12/16/20 Dr Sharlet Salina - f/u for shoulder pain, DM. No med changes.  Recent consult visits: 03/29/2021 - Dr. Melvyn Novas - Neurology  03/21/2021 - Eric Form NP - Pulmonology - counseled on smoking cessation   03/15/2021 - Dr. Melvyn Novas - Neurology  03/03/2021 - Dr. Dwyane Dee - Endocrinology -reduction of levemir to 5 units / 4 units depending on AM sugars  01/25/2021 - Dr. Silas Flood - Pulmonology - evaluation of chronic cough - prescribed Anoro inhaler   Hospital visits: None in previous 6 months  Objective:  Lab Results  Component Value Date   CREATININE 0.79 02/28/2021   BUN 19 02/28/2021   GFR 77.71 02/28/2021   GFRNONAA >60 05/02/2015   GFRAA >60 05/02/2015   NA 141 02/28/2021  K 4.2 02/28/2021   CALCIUM 9.4 02/28/2021   CO2 32 02/28/2021   GLUCOSE 88 02/28/2021   GLUCOSE 88 02/28/2021    Lab Results  Component Value Date/Time   HGBA1C 6.6 (H) 02/28/2021 10:54 AM   HGBA1C 6.4 (A) 12/16/2020 03:48 PM   HGBA1C 6.8 (H) 09/16/2020 10:36 AM   FRUCTOSAMINE 325 (H)  02/28/2021 10:54 AM   FRUCTOSAMINE 322 (H) 10/11/2017 09:24 AM   GFR 77.71 02/28/2021 10:54 AM   GFR 80.39 09/16/2020 10:36 AM   MICROALBUR 1.0 06/10/2020 01:43 PM   MICROALBUR 1.9 09/16/2019 10:28 AM    Last diabetic Eye exam:  Lab Results  Component Value Date/Time   HMDIABEYEEXA No Retinopathy 01/07/2021 09:09 AM    Last diabetic Foot exam:  No results found for: HMDIABFOOTEX   Lab Results  Component Value Date   CHOL 181 09/16/2020   HDL 84.40 09/16/2020   LDLCALC 84 09/16/2020   LDLDIRECT 101.7 03/26/2012   TRIG 64.0 09/16/2020   CHOLHDL 2 09/16/2020    Hepatic Function Latest Ref Rng & Units 09/16/2020 06/10/2020 09/16/2019  Total Protein 6.0 - 8.3 g/dL 6.9 6.9 6.6  Albumin 3.5 - 5.2 g/dL 4.2 4.3 4.0  AST 0 - 37 U/L 25 25 21   ALT 0 - 35 U/L 19 19 17   Alk Phosphatase 39 - 117 U/L 46 44 49  Total Bilirubin 0.2 - 1.2 mg/dL 0.5 0.5 0.5  Bilirubin, Direct 0.0 - 0.3 mg/dL - - -    Lab Results  Component Value Date/Time   TSH 1.78 06/13/2018 09:57 AM   TSH 2.59 06/04/2018 10:39 AM   FREET4 0.84 02/06/2013 11:50 AM    CBC Latest Ref Rng & Units 06/10/2020 06/04/2018 03/20/2017  WBC 4.0 - 10.5 K/uL 4.4 3.8(L) 3.3(L)  Hemoglobin 12.0 - 15.0 g/dL 12.0 12.9 13.8  Hematocrit 36.0 - 46.0 % 36.7 38.5 41.5  Platelets 150.0 - 400.0 K/uL 237.0 259.0 213.0    Lab Results  Component Value Date/Time   VD25OH 38.26 06/04/2018 10:39 AM   VD25OH 50.94 05/31/2016 11:25 AM    Clinical ASCVD: No  The 10-year ASCVD risk score (Arnett DK, et al., 2019) is: 18.9%   Values used to calculate the score:     Age: 70 years     Sex: Female     Is Non-Hispanic African American: No     Diabetic: Yes     Tobacco smoker: Yes     Systolic Blood Pressure: 161 mmHg     Is BP treated: No     HDL Cholesterol: 84.4 mg/dL     Total Cholesterol: 181 mg/dL    Depression screen [facility] 2/9 12/16/2020 06/10/2020 06/17/2019  Decreased Interest 3 0 1  Down, Depressed, Hopeless 3 0 1  PHQ - 2 Score 6 0 2   Altered sleeping 3 0 2  Tired, decreased energy 2 0 3  Change in appetite 1 0 0  Feeling bad or failure about yourself  3 0 3  Trouble concentrating 3 0 3  Moving slowly or fidgety/restless 1 0 3  Suicidal thoughts 2 0 1  PHQ-9 Score 21 0 17  Difficult doing work/chores - - -  Some encounter information is confidential and restricted. Go to Review Flowsheets activity to see all data.  Some recent data might be hidden    Social History   Tobacco Use  Smoking Status Every Day   Packs/day: 1.50   Years: 50.00   Pack years: 75.00   Types:  Cigarettes  Smokeless Tobacco Never   BP Readings from Last 3 Encounters:  04/28/21 130/70  03/21/21 122/62  03/03/21 (!) 116/50   Pulse Readings from Last 3 Encounters:  04/28/21 65  03/21/21 78  03/03/21 69   Wt Readings from Last 3 Encounters:  04/28/21 101 lb 3.2 oz (45.9 kg)  03/21/21 101 lb 8 oz (46 kg)  03/03/21 102 lb 12.8 oz (46.6 kg)   BMI Readings from Last 3 Encounters:  04/28/21 17.65 kg/m  03/21/21 17.70 kg/m  03/03/21 17.92 kg/m    Assessment/Interventions: Review of patient past medical history, allergies, medications, health status, including review of consultants reports, laboratory and other test data, was performed as part of comprehensive evaluation and provision of chronic care management services.   SDOH:  (Social Determinants of Health) assessments and interventions performed: Yes  SDOH Screenings   Alcohol Screen: Not on file  Depression (PHQ2-9): Medium Risk   PHQ-2 Score: 21  Financial Resource Strain: Not on file  Food Insecurity: Not on file  Housing: Not on file  Physical Activity: Not on file  Social Connections: Not on file  Stress: Not on file  Tobacco Use: High Risk   Smoking Tobacco Use: Every Day   Smokeless Tobacco Use: Never   Passive Exposure: Not on file  Transportation Needs: Not on file    Rapides  Allergies  Allergen Reactions   Penicillins Anaphylaxis    Sulfonamide Derivatives Anaphylaxis    Medications Reviewed Today     Reviewed by [name], MD (Physician) on 04/28/21 at 1105  Med List Status: <None>   Medication Order Taking? Sig Documenting Provider Last Dose Status Informant  Accu-Chek Softclix Lancets lancets [id number] Yes Use as instructed to check blood sugar 4 times per day dx code E10.65 [name], MD Taking Active            Med Note Lesly Rubenstein Dec 16, 2020  3:28 PM) use  Alcohol Swabs (B-D SINGLE USE SWABS REGULAR) PADS [id number] Yes USE FOUR TIMES DAILY [name], MD Taking Active            Med Note Alfredia Ferguson A   Thu Dec 16, 2020  3:28 PM) use  aspirin EC 81 MG tablet [id number] Yes Take 81 mg by mouth every morning. [provider] Taking Active Self  Blood Glucose Calibration (ACCU-CHEK AVIVA) SOLN [id number] Yes USE AS DIRECTED [name], MD Taking Active            Med Note Lesly Rubenstein Dec 16, 2020  3:29 PM) use  Blood Glucose Monitoring Suppl (ACCU-CHEK AVIVA PLUS) w/Device Drucie Opitz [id number] Yes USE AS DIRECTED [name], MD Taking Active            Med Note Lesly Rubenstein Dec 16, 2020  3:29 PM) use  diclofenac Sodium (VOLTAREN) 1 % GEL [id number] Yes APPLY 2 GRAMS EXTERNALLY TO THE AFFECTED AREA FOUR TIMES DAILY [name], MD Taking Active   donepezil (ARICEPT) 10 MG tablet [id number] Yes Take 1 tablet every night Cameron Sprang, MD Taking Active   glucose blood (ACCU-CHEK AVIVA PLUS) test strip [id number] Yes TEST BLOOD SUGAR FOUR TIMES DAILY [name], MD Taking Active   GVOKE HYPOPEN 2-PACK 1 MG/0.2ML Darden Palmer [id number] Yes INJECT 1 MG UNDER THE SKIN AS NEEDED FOR LOW BLOOD SUGAR [name], MD Taking Active   insulin detemir (LEVEMIR FLEXTOUCH) 100 UNIT/ML FlexPen  [id number] Yes Inject 3 units in the morning and 6 units at bedtime [name], MD Taking Active   insulin lispro (HUMALOG KWIKPEN) 100 UNIT/ML KwikPen [id number] Yes inject 1-3 UNITS  tid for large meals [name], MD Taking Active   Insulin Pen Needle 32G X 4 MM MISC [id number] Yes Use inject insulin tid [name], MD Taking Active   lamoTRIgine (LAMICTAL) 100 MG tablet [id number] Yes Take 1 tablet (100 mg total) by mouth 2 (two) times daily. Charlcie Cradle, MD Taking Active   lidocaine (LIDODERM) 5 % [id number] Yes UNWRAP AND APPLY 1 PATCH TO SKIN EVERY DAY(REMOVE AND DISCARD PATCH WITHIN 12 HOURS OR AS DIRECTED BY DOCTOR) [name], MD Taking Active   lipase/protease/amylase (CREON) 36000 UNITS CPEP capsule [id number] Yes Take 2 capsules (72,000 Units total) by mouth 3 (three) times daily before meals. [name], MD Taking Active   loratadine (CLARITIN) 10 MG tablet [id number] Yes Take 10 mg by mouth as needed for allergies. [provider] Taking Active Self  lovastatin (MEVACOR) 40 MG tablet [id number] Yes Take 1 tablet (40 mg total) by mouth at bedtime. [name], MD Taking Active   Magnesium 500 MG TABS [id number] Yes Take 1 tablet by mouth as needed. [provider] Taking Active   metoCLOPramide (REGLAN) 5 MG tablet [id number] Yes TAKE 1 TABLET THREE TIMES DAILY BEFORE MEALS AS NEEDED [name], MD Taking Active   mirtazapine (REMERON) 15 MG tablet [id number] Yes Take 1 tablet (15 mg total) by mouth at bedtime. Charlcie Cradle, MD Taking Active   Multiple Vitamin (MULTIVITAMIN) tablet [id number] Yes Take 1 tablet by mouth daily. Reported on 11/22/2015 [provider] Taking Active Self           Med Note Driscilla Grammes Sep 20, 2020  2:59 PM) Taking gummy vitamins  Omega-3 Fatty Acids (FISH OIL) 1000 MG CAPS [id number] Yes Take 1 capsule by mouth daily. [provider] Taking Active   omeprazole (PRILOSEC) 40 MG capsule [id number] Yes TAKE 1 CAPSULE BY MOUTH EVERY DAY [name], MD Taking Active   umeclidinium-vilanterol [facility] ELLIPTA) 62.5-25 MCG/INH AEPB [id number] Yes Inhale 1 puff  into the lungs daily. Hunsucker, Bonna Gains, MD Taking Active   vitamin B-12 (CYANOCOBALAMIN) 1000 MCG tablet [id number] Yes Take 1 tablet by mouth daily. [provider] Taking Active   vitamin E 1000 UNIT capsule [id number] Yes Take 1,000 Units by mouth daily. [provider] Taking Active Self            Patient Active Problem List   Diagnosis Date Noted   Pancreatic insufficiency 04/29/2021   Aortic atherosclerosis ([place]) 04/29/2021   History of substance abuse 03/15/2021   Mild neurocognitive disorder due to multiple etiologies 03/15/2021   Fibromyalgia    Hypoglycemia unawareness associated with type 1 diabetes mellitus 11/23/2020   Acquired trigger finger of left ring finger 02/18/2019   Right shoulder pain 02/18/2018   Protein calorie malnutrition 06/01/2017   WBC decreased 03/27/2017   Diabetic gastroparesis associated with type 1 diabetes mellitus 10/24/2016   Abdominal pain 02/28/2016   Insomnia 04/13/2015   Major depressive disorder 07/28/2014   Generalized anxiety disorder 04/27/2014   Abnormal CT scan, bladder    Type 1 diabetes mellitus with complication 68/37/2902   Hepatitis B virus infection 07/05/2010   Mixed diabetic hyperlipidemia associated with type 1 diabetes mellitus ([place]) 07/05/2010   Bipolar disorder 07/05/2010   Smokers' cough ([place]) 07/05/2010  Migraine headache 07/05/2010   GERD (gastroesophageal reflux disease) 07/05/2010   Chronic lower back pain 07/05/2010    Immunization History  Administered Date(s) Administered   Fluad Quad(high Dose 65+) 04/24/2019, 04/20/2020, 04/28/2021   Influenza Split 03/22/2011, 03/26/2012   Influenza,inj,Quad PF,6+ Mos 02/28/2013, 04/13/2014, 05/13/2015, 02/28/2016, 03/26/2017, 06/04/2018   PFIZER(Purple Top)SARS-COV-2 Vaccination 10/01/2019, 10/29/2019, 06/21/2020   Pneumococcal Conjugate-13 07/24/2016   Pneumococcal Polysaccharide-23 07/31/2013, 06/17/2019   Td 07/28/2011   Zoster, Live  05/31/2016    Conditions to be addressed/monitored:  Hypertension, Hyperlipidemia, Diabetes, and Depression  There are no care plans that you recently modified to display for this patient.     Medication Assistance: None required.  Patient affirms current coverage meets needs.  Care Gaps: Shingrix Covid booster (due 10/20/20)  Patient's preferred pharmacy is:  [facility] [place], [place] [address] Phone: [phone] Fax: [phone]  [facility] ([place], [place] [employer] [facility] [address] [place] [address] [place] [address] Phone: [phone] Fax: [phone]   Uses pill box? Yes Pt endorses 100% compliance  Care Plan and Follow Up Patient Decision:  Patient agrees to Care Plan and Follow-up.  Plan: Telephone follow up appointment with care management team member scheduled for:  6 months The patient has been provided with contact information for the care management team and has been advised to call with any health related questions or concerns.   Tomasa Blase, PharmD Clinical Pharmacist, [place]

## 2021-06-01 DIAGNOSIS — F332 Major depressive disorder, recurrent severe without psychotic features: Secondary | ICD-10-CM

## 2021-06-01 DIAGNOSIS — E108 Type 1 diabetes mellitus with unspecified complications: Secondary | ICD-10-CM

## 2021-06-01 DIAGNOSIS — E1065 Type 1 diabetes mellitus with hyperglycemia: Secondary | ICD-10-CM | POA: Diagnosis not present

## 2021-06-01 DIAGNOSIS — F3132 Bipolar disorder, current episode depressed, moderate: Secondary | ICD-10-CM

## 2021-06-02 ENCOUNTER — Telehealth (HOSPITAL_BASED_OUTPATIENT_CLINIC_OR_DEPARTMENT_OTHER): Payer: Medicare Other | Admitting: Psychiatry

## 2021-06-02 ENCOUNTER — Other Ambulatory Visit: Payer: Self-pay

## 2021-06-02 DIAGNOSIS — F332 Major depressive disorder, recurrent severe without psychotic features: Secondary | ICD-10-CM

## 2021-06-02 DIAGNOSIS — G4701 Insomnia due to medical condition: Secondary | ICD-10-CM | POA: Diagnosis not present

## 2021-06-02 DIAGNOSIS — F4010 Social phobia, unspecified: Secondary | ICD-10-CM | POA: Diagnosis not present

## 2021-06-02 MED ORDER — LAMOTRIGINE 100 MG PO TABS
100.0000 mg | ORAL_TABLET | Freq: Two times a day (BID) | ORAL | 0 refills | Status: DC
Start: 2021-06-02 — End: 2021-08-11

## 2021-06-02 MED ORDER — TRAZODONE HCL 100 MG PO TABS
200.0000 mg | ORAL_TABLET | Freq: Every day | ORAL | 0 refills | Status: DC
Start: 2021-06-02 — End: 2021-08-11

## 2021-06-02 NOTE — Progress Notes (Signed)
Virtual Visit via Telephone Note  I connected with Leslie Lane on 06/02/21 at  2:15 PM EST by telephone and verified that I am speaking with the correct person using two identifiers.  Location: Patient: in car as Microbiologist: office   I discussed the limitations, risks, security and privacy concerns of performing an evaluation and management service by telephone and the availability of in person appointments. I also discussed with the patient that there may be a patient responsible charge related to this service. The patient expressed understanding and agreed to proceed.   History of Present Illness: "I guess I feel alright". She can't tell if she is depressed because of her blood sugar fluctuations. Her sleep is good with Trazodone. She is going to the GI doctor next month. She has frequent stomach pain and diarrhea daily. It effects her mood greatly. [name] is always tired. Her concentration and focus are poor. She has ongoing anhedonia and negative self thoughts. [name] denies SI/HI.    Observations/Objective:  General Appearance: unable to assess  Eye Contact:  unable to assess  Speech:  Clear and Coherent and Normal Rate  Volume:  Normal  Mood:  Depressed  Affect:  Congruent  Thought Process:  Goal Directed, Linear, and Descriptions of Associations: Intact  Orientation:  Full (Time, Place, and Person)  Thought Content:  Logical  Suicidal Thoughts:  No  Homicidal Thoughts:  No  Memory:  Immediate;   Fair  Judgement:  Fair  Insight:  Fair  Psychomotor Activity: unable to assess  Concentration:  Concentration: Fair  Recall:  [place] of Knowledge:  Good  Language:  Good  Akathisia:  unable to assess  Handed:  unable to assess  AIMS (if indicated):     Assets:  Communication Skills Desire for Improvement Financial Resources/Insurance Housing Resilience Talents/Skills Vocational/Educational  ADL's:  unable to assess  Cognition:  WNL  Sleep:         Assessment and Plan: Depression screen [facility] 2/9 06/02/2021 04/28/2021 12/30/2020 12/16/2020 09/02/2020  Decreased Interest 3 3 3 3 2   Down, Depressed, Hopeless 3 3 3 3 2   PHQ - 2 Score 6 6 6 6 4   Altered sleeping 0 0 3 3 2   Tired, decreased energy 3 3 3 2 3   Change in appetite 3 3 3 1 3   Feeling bad or failure about yourself  3 3 3 3 2   Trouble concentrating 3 3 3 3 3   Moving slowly or fidgety/restless 2 3 0 1 1  Suicidal thoughts 0 0 3 2 0  PHQ-9 Score 20 21 24 21 18   Difficult doing work/chores Very difficult Extremely dIfficult Extremely dIfficult - Extremely dIfficult  Some recent data might be hidden    Flowsheet Row Video Visit from 06/02/2021 in [facility] Video Visit from 04/28/2021 in [facility] Video Visit from 12/30/2020 in [facility]  C-SSRS RISK CATEGORY No Risk No Risk Error: Q3, 4, or 5 should not be populated when Q2 is No       1. Major depressive disorder, recurrent, severe without psychotic features ([place]) - lamoTRIgine (LAMICTAL) 100 MG tablet; Take 1 tablet (100 mg total) by mouth 2 (two) times daily.  Dispense: 180 tablet; Refill: 0 - traZODone (DESYREL) 100 MG tablet; Take 2 tablets (200 mg total) by mouth at bedtime.  Dispense: 180 tablet; Refill: 0  2. Social anxiety disorder - lamoTRIgine (LAMICTAL) 100 MG tablet; Take 1 tablet (100  mg total) by mouth 2 (two) times daily.  Dispense: 180 tablet; Refill: 0  3. Insomnia due to medical condition - traZODone (DESYREL) 100 MG tablet; Take 2 tablets (200 mg total) by mouth at bedtime.  Dispense: 180 tablet; Refill: 0  D/c Remeron per patient preference and restart Trazodone at 200mg  as higher doses make her overly fatigued  Follow Up Instructions: In 2-3 months or sooner if needed   I discussed the assessment and treatment plan with the patient. The patient was provided an opportunity to  ask questions and all were answered. The patient agreed with the plan and demonstrated an understanding of the instructions.   The patient was advised to call back or seek an in-person evaluation if the symptoms worsen or if the condition fails to improve as anticipated.  I provided 11 minutes of non-face-to-face time during this encounter.   Charlcie Cradle, MD

## 2021-06-03 ENCOUNTER — Other Ambulatory Visit (HOSPITAL_COMMUNITY): Payer: Self-pay | Admitting: Psychiatry

## 2021-06-03 DIAGNOSIS — F332 Major depressive disorder, recurrent severe without psychotic features: Secondary | ICD-10-CM

## 2021-06-03 DIAGNOSIS — G4701 Insomnia due to medical condition: Secondary | ICD-10-CM

## 2021-06-06 ENCOUNTER — Other Ambulatory Visit: Payer: Medicare Other

## 2021-06-07 ENCOUNTER — Other Ambulatory Visit (INDEPENDENT_AMBULATORY_CARE_PROVIDER_SITE_OTHER): Payer: Medicare Other

## 2021-06-07 ENCOUNTER — Other Ambulatory Visit: Payer: Self-pay

## 2021-06-07 DIAGNOSIS — R5382 Chronic fatigue, unspecified: Secondary | ICD-10-CM | POA: Diagnosis not present

## 2021-06-07 DIAGNOSIS — E1065 Type 1 diabetes mellitus with hyperglycemia: Secondary | ICD-10-CM | POA: Diagnosis not present

## 2021-06-07 DIAGNOSIS — E7800 Pure hypercholesterolemia, unspecified: Secondary | ICD-10-CM

## 2021-06-07 LAB — MICROALBUMIN / CREATININE URINE RATIO
Creatinine,U: 92.3 mg/dL
Microalb Creat Ratio: 2.5 mg/g (ref 0.0–30.0)
Microalb, Ur: 2.4 mg/dL — ABNORMAL HIGH (ref 0.0–1.9)

## 2021-06-07 LAB — URINALYSIS, ROUTINE W REFLEX MICROSCOPIC
Bilirubin Urine: NEGATIVE
Hgb urine dipstick: NEGATIVE
Ketones, ur: NEGATIVE
Leukocytes,Ua: NEGATIVE
Nitrite: NEGATIVE
Specific Gravity, Urine: 1.01 (ref 1.000–1.030)
Total Protein, Urine: NEGATIVE
Urine Glucose: NEGATIVE
Urobilinogen, UA: 0.2 (ref 0.0–1.0)
pH: 6.5 (ref 5.0–8.0)

## 2021-06-07 LAB — COMPREHENSIVE METABOLIC PANEL
ALT: 16 U/L (ref 0–35)
AST: 21 U/L (ref 0–37)
Albumin: 4.7 g/dL (ref 3.5–5.2)
Alkaline Phosphatase: 46 U/L (ref 39–117)
BUN: 20 mg/dL (ref 6–23)
CO2: 32 mEq/L (ref 19–32)
Calcium: 10 mg/dL (ref 8.4–10.5)
Chloride: 99 mEq/L (ref 96–112)
Creatinine, Ser: 0.76 mg/dL (ref 0.40–1.20)
GFR: 81.25 mL/min (ref >60.00)
Glucose, Bld: 115 mg/dL — ABNORMAL HIGH (ref 70–99)
Potassium: 4.6 mEq/L (ref 3.5–5.1)
Sodium: 138 mEq/L (ref 135–145)
Total Bilirubin: 0.4 mg/dL (ref 0.2–1.2)
Total Protein: 7.2 g/dL (ref 6.0–8.3)

## 2021-06-07 LAB — LIPID PANEL
Cholesterol: 209 mg/dL — ABNORMAL HIGH (ref 0–200)
HDL: 115 mg/dL (ref >39.00)
LDL Cholesterol: 81 mg/dL (ref 0–99)
NonHDL: 93.86
Total CHOL/HDL Ratio: 2
Triglycerides: 64 mg/dL (ref 0.0–149.0)
VLDL: 12.8 mg/dL (ref 0.0–40.0)

## 2021-06-07 LAB — HEMOGLOBIN A1C: Hgb A1c MFr Bld: 6.7 % — ABNORMAL HIGH (ref 4.6–6.5)

## 2021-06-07 LAB — TSH: TSH: 0.71 u[IU]/mL (ref 0.35–5.50)

## 2021-06-09 ENCOUNTER — Encounter: Payer: Self-pay | Admitting: Endocrinology

## 2021-06-09 ENCOUNTER — Other Ambulatory Visit: Payer: Self-pay

## 2021-06-09 ENCOUNTER — Ambulatory Visit: Payer: Medicare Other | Admitting: Endocrinology

## 2021-06-09 VITALS — BP 120/66 | HR 68 | Ht 63.5 in | Wt 101.0 lb

## 2021-06-09 DIAGNOSIS — E7800 Pure hypercholesterolemia, unspecified: Secondary | ICD-10-CM

## 2021-06-09 DIAGNOSIS — E1042 Type 1 diabetes mellitus with diabetic polyneuropathy: Secondary | ICD-10-CM

## 2021-06-09 DIAGNOSIS — E108 Type 1 diabetes mellitus with unspecified complications: Secondary | ICD-10-CM | POA: Diagnosis not present

## 2021-06-09 LAB — POCT GLYCOSYLATED HEMOGLOBIN (HGB A1C): Hemoglobin A1C: 6.6 % — AB (ref 4.0–5.6)

## 2021-06-09 NOTE — Patient Instructions (Signed)
Only take Humalog at lunch for pasta  4-5 Humalog for 8 pm snacks

## 2021-06-09 NOTE — Progress Notes (Signed)
Patient ID: Leslie Lane, female   DOB: May 16, 1956, 65 y.o.   MRN: [id number]  Reason for Appointment: Endocrinology follow-up   History of Present Illness    Diagnosis: Type 1 DIABETES MELITUS, diagnosed 1967      She has had labile blood sugar control over the last several years even though A1c has been usually around 7% She has had less lability and hypoglycemia with taking b.i.d. Lantus compared once a day She has been very sensitive to fast acting insulin and frequently does not require mealtime coverage She cannot tolerate Toujeo because of reported episode of headache, bloating and nausea with the first dose  RECENT history:  Insulin regimen: LEVEMIR 3 units in the morning and 7 at 11 pm HUMALOG 1-2 units up to 3 times a day  Current blood sugar patterns, control and problems identified:  Her A1c is about the same at 6.6  She has been using the freestyle libre although did not have the sensor on her the first 3 days of the last 2 weeks data She now appears to be having significant problem with hyperglycemia from late evening snacks such as Cheetos, and she thinks she is eating large portions because of borderline and likely depression However blood sugars are generally better in the afternoons and may be low normal at times when she is more active overall She appears to be needing Humalog more consistently at all meals compared to before although only taking 1 or 2 units at breakfast and lunch, she takes relatively more insulin when she is eating starchy foods like noodles She does not think it is consistently accurate but occasionally it is close to the fingersticks  She only has minimal hypoglycemia with generally running low normal readings early morning or late afternoon  Has been able to avoid hypoglycemia in the afternoon usually compared to previous visits  Mealtimes: Breakfast 8-8.30 am, lunch 12 noon , dinner 5 pm  Hypoglycemia: Symptoms may be absent  with low blood sugars and does not have early warning symptoms. Gets confused and she frequently depends on her mother to recognize low sugars and treat them.  Her mother knows how to give Glucagon injection  She will treat her low blood sugars usually with juice, does carry glucose tablets when not at home  Analysis of her blood sugar patterns from freestyle libre to download for the last 2 weeks as follows   Patient states her freestyle Elenor Legato is accurate compared to her Accu-Chek Overall patient has mostly hyperglycemia starting after about 7-8 PM continuing to the night until 3 AM and then generally under good control She does have variable rise in blood sugar after breakfast in the morning but on an average not going up on 50 mg 1 Otherwise POSTPRANDIAL readings are all low normal after lunch; Blood sugars after dinnertime around 5 PM are only occasionally higher than Premeal readings HIGHEST blood sugars appear to be starting after 7-8 PM going up to as much as 300 often and only gradually decreasing after 11 PM Hypoglycemia occurred only once overnight and once around 6 PM Overnight blood sugars as above are the highest around 11 PM and progressively decreased until about 4 AM    CGM use % of time   2-week average/GV 165  Time in range    58    %  % Time Above 180 27+2  % Time above 250   % Time Below 70 3     PRE-MEAL Fasting  Lunch Dinner Bedtime Overall  Glucose range:       Averages: 134 155 139 229    POST-MEAL PC Breakfast PC Lunch PC Dinner  Glucose range:     Averages: 188 137 152    PREVIOUS data:   PRE-MEAL Fasting Lunch Dinner Overnight Overall  Glucose range: 35-151    26-268  Mean/median: 120 85 99 96 106   POST-MEAL PC Breakfast PC Lunch PC Dinner  Glucose range:  98 151  Mean/median:           Wt Readings from Last 3 Encounters:  06/09/21 101 lb (45.8 kg)  04/28/21 101 lb 3.2 oz (45.9 kg)  03/21/21 101 lb 8 oz (46 kg)    Lab Results   Component Value Date   HGBA1C 6.6 (A) 06/09/2021   HGBA1C 6.7 (H) 06/07/2021   HGBA1C 6.6 (H) 02/28/2021   Lab Results  Component Value Date   MICROALBUR 2.4 (H) 06/07/2021   LDLCALC 81 06/07/2021   CREATININE 0.76 06/07/2021        Allergies as of 06/09/2021       Reactions   Penicillins Anaphylaxis   Sulfonamide Derivatives Anaphylaxis        Medication List        Accurate as of June 09, 2021 11:59 PM. If you have any questions, ask your nurse or doctor.          Accu-Chek Aviva Plus test strip Generic drug: glucose blood TEST BLOOD SUGAR FOUR TIMES DAILY   Accu-Chek Aviva Plus w/Device Kit USE AS DIRECTED   Accu-Chek Aviva Soln USE AS DIRECTED   Accu-Chek Softclix Lancets lancets Use as instructed to check blood sugar 4 times per day dx code E10.65   Anoro Ellipta 62.5-25 MCG/ACT Aepb Generic drug: umeclidinium-vilanterol Inhale 1 puff into the lungs daily.   aspirin EC 81 MG tablet Take 81 mg by mouth every morning.   B-D SINGLE USE SWABS REGULAR Pads USE FOUR TIMES DAILY   diclofenac Sodium 1 % Gel Commonly known as: VOLTAREN APPLY 2 GRAMS EXTERNALLY TO THE AFFECTED AREA FOUR TIMES DAILY   dicyclomine 10 MG capsule Commonly known as: BENTYL Take 1 capsule (10 mg total) by mouth 4 (four) times daily -  before meals and at bedtime.   donepezil 10 MG tablet Commonly known as: ARICEPT Take 1 tablet every night   Fish Oil 1000 MG Caps Take 1 capsule by mouth daily.   Gvoke HypoPen 2-Pack 1 MG/0.2ML Soaj Generic drug: Glucagon INJECT 1 MG UNDER THE SKIN AS NEEDED FOR LOW BLOOD SUGAR   insulin lispro 100 UNIT/ML KwikPen Commonly known as: HumaLOG KwikPen inject 1-3 UNITS tid for large meals   Insulin Pen Needle 32G X 4 MM Misc Use inject insulin tid   lamoTRIgine 100 MG tablet Commonly known as: LAMICTAL Take 1 tablet (100 mg total) by mouth 2 (two) times daily.   Levemir FlexTouch 100 UNIT/ML FlexPen Generic drug: insulin  detemir Inject 3 units in the morning and 6 units at bedtime What changed: additional instructions   lidocaine 5 % Commonly known as: LIDODERM UNWRAP AND APPLY 1 PATCH TO SKIN EVERY DAY(REMOVE AND DISCARD PATCH WITHIN 12 HOURS OR AS DIRECTED BY DOCTOR)   lipase/protease/amylase 36000 UNITS Cpep capsule Commonly known as: Creon Take 2 capsules (72,000 Units total) by mouth 3 (three) times daily before meals.   loratadine 10 MG tablet Commonly known as: CLARITIN Take 10 mg by mouth as needed for allergies.   lovastatin  40 MG tablet Commonly known as: MEVACOR Take 1 tablet (40 mg total) by mouth at bedtime.   Magnesium 500 MG Tabs Take 1 tablet by mouth as needed.   metoCLOPramide 5 MG tablet Commonly known as: REGLAN TAKE 1 TABLET BY MOUTH THREE TIMES DAILY BEFORE MEALS AS NEEDED   multivitamin tablet Take 1 tablet by mouth daily. Reported on 11/22/2015   omeprazole 40 MG capsule Commonly known as: PRILOSEC Take 1 capsule (40 mg total) by mouth in the morning and at bedtime.   traZODone 100 MG tablet Commonly known as: DESYREL Take 2 tablets (200 mg total) by mouth at bedtime.   vitamin B-12 1000 MCG tablet Commonly known as: CYANOCOBALAMIN Take 1 tablet by mouth daily.   vitamin E 1000 UNIT capsule Take 1,000 Units by mouth daily.        Allergies:  Allergies  Allergen Reactions   Penicillins Anaphylaxis   Sulfonamide Derivatives Anaphylaxis    Past Medical History:  Diagnosis Date   Abdominal pain 02/28/2016   Abnormal CT scan, bladder    Noted 07/28/13 CT - s/p uro eval 08/2013 [place] -    Acquired trigger finger of left ring finger 02/18/2019   Injected February 18, 2019 and March 2022   Allergic rhinitis    Anemia    Arthritis    Bipolar disorder 07/05/2010   Cataract    Chronic lower back pain 07/05/2010   Chronic neck pain    Diabetic gastroparesis associated with type 1 diabetes mellitus 10/24/2016   Fibromyalgia    Generalized anxiety  disorder 04/27/2014   GERD (gastroesophageal reflux disease) 07/05/2010   Hepatitis B virus infection 07/05/2010   History of substance abuse    opiates; cocaine   Hypoglycemia unawareness associated with type 1 diabetes mellitus 11/23/2020   Insomnia 04/13/2015   Major depressive disorder 07/28/2014   Migraine headache 07/05/2010   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  It hasn't happened in a long time."   Smokers' cough 07/05/2010   Type 1 diabetes mellitus with complication 56/43/3295   WBC decreased 03/27/2017    Past Surgical History:  Procedure Laterality Date   CESAREAN SECTION     COLONOSCOPY     TUBAL LIGATION     UPPER GASTROINTESTINAL ENDOSCOPY      Family History  Problem Relation Age of Onset   Arthritis Mother    Colon cancer Mother        ? age of dx   Arthritis Father    Kidney disease Father    Kidney cancer Father    Heart attack Brother    Bladder Cancer Sister    Hyperlipidemia Other    Kidney cancer Paternal Aunt    Anxiety disorder Neg Hx    Bipolar disorder Neg Hx    Depression Neg Hx    Breast cancer Neg Hx    Esophageal cancer Neg Hx    Stomach cancer Neg Hx    Rectal cancer Neg Hx     Social History:  reports that she has been smoking cigarettes. She has a 75.00 pack-year smoking history. She has never used smokeless tobacco. She reports current alcohol use of about 7.0 standard drinks per week. She reports  that she does not currently use drugs after having used the following drugs: Cocaine.  Review of Systems:   Had gastroparesis treated with Reglan tid-qid with fairly good control   Having more abdominal discomfort in the morning but no vomiting, only partially  getting benefit from Bentyl with her diarrhea and is scheduled to see gastroenterologist   NEUROPATHY: She has history of burning in her legs and feet and she thinks that gabapentin did not work and caused her to be sleepy She did not think diclofenac gel helps, this was given by her PCP   DEPRESSION: She has had long-standing depression and anxiety on long-term treatment  HYPERLIPIDEMIA: The lipid abnormality consists of elevated LDL   Is  on lovastatin 40 mg with  good control   Lab Results  Component Value Date   CHOL 209 (H) 06/07/2021   HDL 115.00 06/07/2021   LDLCALC 81 06/07/2021   LDLDIRECT 101.7 03/26/2012   TRIG 64.0 06/07/2021   CHOLHDL 2 06/07/2021    Memory difficulties: She was given Aricept by the neurologist, is taking 10 mg    Examination:   BP 120/66 (BP Location: Left Arm, Patient Position: Sitting, Cuff Size: Small)   Pulse 68   Ht 5' 3.5" (1.613 m)   Wt 101 lb (45.8 kg)   SpO2 97%   BMI 17.61 kg/m   Body mass index is 17.61 kg/m.    ASSESSMENT/ PLAN:   Diabetes type 1 with poor control  See history of present illness for detailed discussion of current insulin, blood sugar patterns, problems identified  A1c has been under 7% and now 6.6  Although her A1c is about the same and this may be a balancing of her high and low sugars, previously would tend to have more frequent hypoglycemia which she is not doing now This is likely to be from her increased food intake and carbohydrates She has marked increase in blood sugars after snacking with high fat foods a couple hours after dinner with blood sugars averaging well over 200 at bedtime She is also taking more Humalog before meals and snacks Also did not cut down on her Levemir in the evening as directed  Discussed adjusting her Humalog to keep her blood sugars at least under 180 after meals and snacks She will likely need 4 to 5 units Humalog for her snacks at around 8 PM She may do better with  cutting back on excessive snacking However at lunchtime she should not take any Humalog since blood sugars are lower in the afternoon and may only take 1 or 2 units if eating a large amount of noodles or other pasta  For her neuropathy we will try her on Cymbalta but first will get clearance from her psychiatrist  Follow-up in 3 months with A1c  Urine microalbumin normal  Lipids well controlled  Total visit time including counseling = 30 minutes  Patient Instructions  Only take Humalog at lunch for pasta  4-5 Humalog for 8 pm snacks      Leslie Lane 06/10/2021, 8:36 AM

## 2021-06-10 ENCOUNTER — Telehealth: Payer: Self-pay | Admitting: Endocrinology

## 2021-06-10 MED ORDER — DULOXETINE HCL 30 MG PO CPEP: 30.0000 mg | ORAL_CAPSULE | Freq: Every day | ORAL | 3 refills | Status: DC

## 2021-06-10 NOTE — Telephone Encounter (Signed)
Please let her know that I have discussed using duloxetine with her psychiatrist and have sent prescription for 30 mg daily to be taken with her dinner

## 2021-06-10 NOTE — Telephone Encounter (Signed)
Vm left for patient to callback regarding message below.

## 2021-06-17 ENCOUNTER — Other Ambulatory Visit: Payer: Self-pay

## 2021-06-17 ENCOUNTER — Ambulatory Visit (INDEPENDENT_AMBULATORY_CARE_PROVIDER_SITE_OTHER): Payer: Medicare Other | Admitting: Internal Medicine

## 2021-06-17 ENCOUNTER — Encounter: Payer: Self-pay | Admitting: Internal Medicine

## 2021-06-17 DIAGNOSIS — K3184 Gastroparesis: Secondary | ICD-10-CM

## 2021-06-17 DIAGNOSIS — E1043 Type 1 diabetes mellitus with diabetic autonomic (poly)neuropathy: Secondary | ICD-10-CM

## 2021-06-17 NOTE — Assessment & Plan Note (Signed)
Some pain after eating. Seeing GI in 2 weeks. She is taking bentyl and thinks that has helped some. Taking reglan TID prn and this seems to help some. Counseled about small meals. Diarrhea has improved some since resuming creon with meals. Concern for mesenteric ischemia given moderate calcifications on prior CT imaging. She is ongoing smoker.

## 2021-06-17 NOTE — Progress Notes (Signed)
° °  Subjective:   Patient ID: Leslie Lane, female    DOB: 1956/03/17, 65 y.o.   MRN: [id number]  HPI The patient is a 65 YO female coming in for diarrhea follow up.  Review of Systems  Constitutional: Negative.   HENT: Negative.    Eyes: Negative.   Respiratory:  Negative for cough, chest tightness and shortness of breath.   Cardiovascular:  Negative for chest pain, palpitations and leg swelling.  Gastrointestinal:  Positive for abdominal pain and diarrhea. Negative for abdominal distention, constipation, nausea and vomiting.  Musculoskeletal: Negative.   Skin: Negative.   Neurological: Negative.   Psychiatric/Behavioral: Negative.     Objective:  Physical Exam Constitutional:      Appearance: She is well-developed.  HENT:     Head: Normocephalic and atraumatic.  Cardiovascular:     Rate and Rhythm: Normal rate and regular rhythm.  Pulmonary:     Effort: Pulmonary effort is normal. No respiratory distress.     Breath sounds: Normal breath sounds. No wheezing or rales.  Abdominal:     General: Bowel sounds are normal. There is no distension.     Palpations: Abdomen is soft.     Tenderness: There is abdominal tenderness. There is no rebound.  Musculoskeletal:     Cervical back: Normal range of motion.  Skin:    General: Skin is warm and dry.  Neurological:     Mental Status: She is alert and oriented to person, place, and time.     Coordination: Coordination normal.    Vitals:   06/17/21 1413  BP: 128/70  Pulse: 73  Resp: 18  SpO2: 98%  Weight: 101 lb (45.8 kg)  Height: 5' 3.5" (1.613 m)    This visit occurred during the SARS-CoV-2 public health emergency.  Safety protocols were in place, including screening questions prior to the visit, additional usage of staff PPE, and extensive cleaning of exam room while observing appropriate contact time as indicated for disinfecting solutions.   Assessment & Plan:

## 2021-06-17 NOTE — Patient Instructions (Signed)
We will have you see GI in a few weeks.

## 2021-06-29 ENCOUNTER — Other Ambulatory Visit: Payer: Self-pay

## 2021-06-29 ENCOUNTER — Other Ambulatory Visit (HOSPITAL_COMMUNITY): Payer: Self-pay

## 2021-06-29 DIAGNOSIS — E1043 Type 1 diabetes mellitus with diabetic autonomic (poly)neuropathy: Secondary | ICD-10-CM

## 2021-06-29 MED ORDER — GVOKE HYPOPEN 2-PACK 1 MG/0.2ML ~~LOC~~ SOAJ
1.0000 mg | SUBCUTANEOUS | 1 refills | Status: DC | PRN
  Filled 2021-06-29: qty 0.4, 30d supply, fill #0
  Filled 2021-06-29: qty 0.4, 7d supply, fill #0
  Filled 2021-07-18 (×2): qty 0.4, 7d supply, fill #1

## 2021-06-29 MED ORDER — GVOKE HYPOPEN 2-PACK 1 MG/0.2ML ~~LOC~~ SOAJ: SUBCUTANEOUS | 1 refills | Status: DC

## 2021-06-29 NOTE — Telephone Encounter (Signed)
Disregard last message. Pharmacy finally came on phone. Per pharmacy patient picked up medication back on 06/13/21. I called patient and she states she didn't know what medication was for so she didn't take it. She states she will start taking today. FYI: patient sounded a little confused.

## 2021-06-29 NOTE — Telephone Encounter (Signed)
Please confirm that she has picked up her duloxetine prescription and started it.

## 2021-07-06 ENCOUNTER — Telehealth: Payer: Self-pay

## 2021-07-06 NOTE — Progress Notes (Signed)
Chronic Care Management Pharmacy Assistant   Name: Leslie Lane  MRN: [id number] DOB: 1955-07-30   Reason for Encounter: Disease State-General    Recent office visits:  06/17/21 [name], MD-PCP (Diabetic gastroparesis associated with type 1 diabetes mellitus) No orders or med changes  Recent consult visits:  06/09/21 [name], MD-Endocrinology (Type 1 diabetes mellitus with complication) Labs ordered, no med changes  Hospital visits:  None in previous 6 months  Medications: Outpatient Encounter Medications as of 07/06/2021  Medication Sig Note   Accu-Chek Softclix Lancets lancets Use as instructed to check blood sugar 4 times per day dx code E10.65 12/16/2020: use   Alcohol Swabs (B-D SINGLE USE SWABS REGULAR) PADS USE FOUR TIMES DAILY 12/16/2020: use   aspirin EC 81 MG tablet Take 81 mg by mouth every morning.    Blood Glucose Calibration (ACCU-CHEK AVIVA) SOLN USE AS DIRECTED 12/16/2020: use   Blood Glucose Monitoring Suppl (ACCU-CHEK AVIVA PLUS) w/Device KIT USE AS DIRECTED 12/16/2020: use   diclofenac Sodium (VOLTAREN) 1 % GEL APPLY 2 GRAMS EXTERNALLY TO THE AFFECTED AREA FOUR TIMES DAILY    dicyclomine (BENTYL) 10 MG capsule Take 1 capsule (10 mg total) by mouth 4 (four) times daily -  before meals and at bedtime.    donepezil (ARICEPT) 10 MG tablet Take 1 tablet every night    DULoxetine (CYMBALTA) 30 MG capsule Take 1 capsule (30 mg total) by mouth daily.    Glucagon (GVOKE HYPOPEN 2-PACK) 1 MG/0.2ML SOAJ INJECT 1 MG UNDER THE SKIN AS NEEDED FOR LOW BLOOD SUGAR    Glucagon (GVOKE HYPOPEN 2-PACK) 1 MG/0.2ML SOAJ Inject 1 mg into the skin as needed for low blood sugar.    glucose blood (ACCU-CHEK AVIVA PLUS) test strip TEST BLOOD SUGAR FOUR TIMES DAILY    insulin detemir (LEVEMIR FLEXTOUCH) 100 UNIT/ML FlexPen Inject 3 units in the morning and 6 units at bedtime (Patient taking differently: Inject 3 units in the morning and 7 units at bedtime)    insulin lispro  (HUMALOG KWIKPEN) 100 UNIT/ML KwikPen inject 1-3 UNITS tid for large meals    Insulin Pen Needle 32G X 4 MM MISC Use inject insulin tid    lamoTRIgine (LAMICTAL) 100 MG tablet Take 1 tablet (100 mg total) by mouth 2 (two) times daily.    lidocaine (LIDODERM) 5 % UNWRAP AND APPLY 1 PATCH TO SKIN EVERY DAY(REMOVE AND DISCARD PATCH WITHIN 12 HOURS OR AS DIRECTED BY DOCTOR)    lipase/protease/amylase (CREON) 36000 UNITS CPEP capsule Take 2 capsules (72,000 Units total) by mouth 3 (three) times daily before meals.    loratadine (CLARITIN) 10 MG tablet Take 10 mg by mouth as needed for allergies.    lovastatin (MEVACOR) 40 MG tablet Take 1 tablet (40 mg total) by mouth at bedtime.    Magnesium 500 MG TABS Take 1 tablet by mouth as needed.    metoCLOPramide (REGLAN) 5 MG tablet TAKE 1 TABLET BY MOUTH THREE TIMES DAILY BEFORE MEALS AS NEEDED    Multiple Vitamin (MULTIVITAMIN) tablet Take 1 tablet by mouth daily. Reported on 11/22/2015 09/20/2020: Taking gummy vitamins   Omega-3 Fatty Acids (FISH OIL) 1000 MG CAPS Take 1 capsule by mouth daily.    omeprazole (PRILOSEC) 40 MG capsule Take 1 capsule (40 mg total) by mouth in the morning and at bedtime.    traZODone (DESYREL) 100 MG tablet Take 2 tablets (200 mg total) by mouth at bedtime.    umeclidinium-vilanterol (ANORO ELLIPTA) 62.5-25  MCG/INH AEPB Inhale 1 puff into the lungs daily.    vitamin B-12 (CYANOCOBALAMIN) 1000 MCG tablet Take 1 tablet by mouth daily.    vitamin E 1000 UNIT capsule Take 1,000 Units by mouth daily.    No facility-administered encounter medications on file as of 07/06/2021.   Have you had any problems recently with your health? Patient stated that she does not have any new health issues at this time.Patient stated that this morning her blood sugar was 99 and after lunch it was 126.   Have you had any problems with your pharmacy?Patient stated that she gets her medications from [facility] and that they made a mistake on her last  delivery and she is about out of her anti-depressants.   What issues or side effects are you having with your medications? Patient states that she does not have any side effects from medications but she is a little irritable because she is low on her anti-depressants  What would you like me to pass along to Rodena Goldmann for them to help you with? Patient states that she is doing ok right now  What can we do to take care of you better? Patient states that she does not need anything at this time  Care Gaps: Colonoscopy-05/03/17 Diabetic Foot Exam-06/10/21 Mammogram-09/30/20 Ophthalmology-01/07/21 Dexa Scan - NA Annual Well Visit - 06/10/20 Micro albumin-06/07/21 Hemoglobin A1c- 06/10/21  Star Rating Drugs: Lovastatin 40 mg-last fill   04/14/21  [place] Pharmacist Assistant [phone]

## 2021-07-08 ENCOUNTER — Ambulatory Visit: Payer: Medicare Other

## 2021-07-12 ENCOUNTER — Telehealth (HOSPITAL_COMMUNITY): Payer: Self-pay

## 2021-07-12 NOTE — Telephone Encounter (Signed)
Patient called stating that she couldn't get her Lamotrigine (Lamictal) 100mg . Writer spoke with the pharmacist at [facility]. [address] and she stated that it was just too early to fill the medication. Pharmacy is getting it filled today for patient to pick up.  NOTIFIED PATIENT

## 2021-07-18 ENCOUNTER — Encounter: Payer: Self-pay | Admitting: Gastroenterology

## 2021-07-18 ENCOUNTER — Other Ambulatory Visit (HOSPITAL_COMMUNITY): Payer: Self-pay

## 2021-07-18 ENCOUNTER — Other Ambulatory Visit (INDEPENDENT_AMBULATORY_CARE_PROVIDER_SITE_OTHER): Payer: Medicare Other

## 2021-07-18 ENCOUNTER — Ambulatory Visit: Payer: Medicare Other | Admitting: Gastroenterology

## 2021-07-18 VITALS — BP 130/60 | HR 76 | Ht 63.75 in | Wt 104.4 lb

## 2021-07-18 DIAGNOSIS — R1030 Lower abdominal pain, unspecified: Secondary | ICD-10-CM

## 2021-07-18 DIAGNOSIS — K219 Gastro-esophageal reflux disease without esophagitis: Secondary | ICD-10-CM | POA: Diagnosis not present

## 2021-07-18 DIAGNOSIS — R197 Diarrhea, unspecified: Secondary | ICD-10-CM

## 2021-07-18 LAB — BUN: BUN: 24 mg/dL — ABNORMAL HIGH (ref 6–23)

## 2021-07-18 LAB — CREATININE, SERUM: Creatinine, Ser: 0.78 mg/dL (ref 0.40–1.20)

## 2021-07-18 MED ORDER — GLYCOPYRROLATE 2 MG PO TABS: 2.0000 mg | ORAL_TABLET | Freq: Two times a day (BID) | ORAL | 11 refills | Status: DC

## 2021-07-18 NOTE — Progress Notes (Addendum)
History of Present Illness: This is a 66 year old female referred by [name], * MD for the evaluation of chronic abdominal pain, diarrhea and weight loss.  The patient relates lower abdominal pain and diarrhea that have been present for many years.  This was previously evaluated in 2018 with CT AP and colonoscopy.  The patient relates she has had steady weight loss over the past year.  Chart review shows her weight was 109 pounds in July 2020 and her weight is 104 pounds today.  She relates she has constant lower abdominal pain which is occasionally exacerbated by bowel movements.  He feels that dicyclomine has provided only minimal relief.  She has been diagnosed with gastroparesis. GES in October 2003 was normal with 1% activity remaining in the stomach at 2 hours.  She relates a history of reflux symptoms that are well controlled on omeprazole twice daily.  She takes states she does not take Reglan regularly and she does not feel it helps her symptoms.  She relates no recent problems with nausea or vomiting.  Pancreatic insufficiency is also listed as a diagnosis.  She is not taking Creon which has been recommended in the past.  Her fecal elastase was in the low normal range at 238 in 2018.  She has no active upper GI symptoms at this time. Denies constipation, change in stool caliber, melena, hematochezia, nausea, vomiting, dysphagia, reflux symptoms, chest pain.    Allergies  Allergen Reactions   Penicillins Anaphylaxis   Sulfonamide Derivatives Anaphylaxis   Outpatient Medications Prior to Visit  Medication Sig Dispense Refill   Accu-Chek Softclix Lancets lancets Use as instructed to check blood sugar 4 times per day dx code E10.65 400 each 1   Alcohol Swabs (B-D SINGLE USE SWABS REGULAR) PADS USE FOUR TIMES DAILY 400 each 2   aspirin EC 81 MG tablet Take 81 mg by mouth every morning.     Blood Glucose Calibration (ACCU-CHEK AVIVA) SOLN USE AS DIRECTED 1 each 1   Blood  Glucose Monitoring Suppl (ACCU-CHEK AVIVA PLUS) w/Device KIT USE AS DIRECTED 1 kit 0   diclofenac Sodium (VOLTAREN) 1 % GEL APPLY 2 GRAMS EXTERNALLY TO THE AFFECTED AREA FOUR TIMES DAILY 700 g 2   dicyclomine (BENTYL) 10 MG capsule Take 1 capsule (10 mg total) by mouth 4 (four) times daily -  before meals and at bedtime. 90 capsule 3   donepezil (ARICEPT) 10 MG tablet Take 1 tablet every night 90 tablet 0   DULoxetine (CYMBALTA) 30 MG capsule Take 1 capsule (30 mg total) by mouth daily. 30 capsule 3   Glucagon (GVOKE HYPOPEN 2-PACK) 1 MG/0.2ML SOAJ INJECT 1 MG UNDER THE SKIN AS NEEDED FOR LOW BLOOD SUGAR 0.4 mL 1   glucose blood (ACCU-CHEK AVIVA PLUS) test strip TEST BLOOD SUGAR FOUR TIMES DAILY 400 strip 2   insulin detemir (LEVEMIR FLEXTOUCH) 100 UNIT/ML FlexPen Inject 3 units in the morning and 6 units at bedtime (Patient taking differently: Inject 3 units in the morning and 7 units at bedtime) 15 mL 5   insulin lispro (HUMALOG KWIKPEN) 100 UNIT/ML KwikPen inject 1-3 UNITS tid for large meals 15 mL 1   Insulin Pen Needle 32G X 4 MM MISC Use inject insulin tid 300 each 2   lamoTRIgine (LAMICTAL) 100 MG tablet Take 1 tablet (100 mg total) by mouth 2 (two) times daily. 180 tablet 0   lidocaine (LIDODERM) 5 % UNWRAP AND APPLY 1 PATCH TO SKIN EVERY  DAY(REMOVE AND DISCARD PATCH WITHIN 12 HOURS OR AS DIRECTED BY DOCTOR) 30 patch 6   lipase/protease/amylase (CREON) 36000 UNITS CPEP capsule Take 2 capsules (72,000 Units total) by mouth 3 (three) times daily before meals. 300 capsule 3   loratadine (CLARITIN) 10 MG tablet Take 10 mg by mouth as needed for allergies.     lovastatin (MEVACOR) 40 MG tablet Take 1 tablet (40 mg total) by mouth at bedtime. 90 tablet 2   Magnesium 500 MG TABS Take 1 tablet by mouth as needed.     metoCLOPramide (REGLAN) 5 MG tablet TAKE 1 TABLET BY MOUTH THREE TIMES DAILY BEFORE MEALS AS NEEDED 270 tablet 1   Multiple Vitamin (MULTIVITAMIN) tablet Take 1 tablet by mouth daily.  Reported on 11/22/2015     Omega-3 Fatty Acids (FISH OIL) 1000 MG CAPS Take 1 capsule by mouth daily.     omeprazole (PRILOSEC) 40 MG capsule Take 1 capsule (40 mg total) by mouth in the morning and at bedtime. 180 capsule 0   traZODone (DESYREL) 100 MG tablet Take 2 tablets (200 mg total) by mouth at bedtime. 180 tablet 0   umeclidinium-vilanterol (ANORO ELLIPTA) 62.5-25 MCG/INH AEPB Inhale 1 puff into the lungs daily. 60 each 11   vitamin B-12 (CYANOCOBALAMIN) 1000 MCG tablet Take 1 tablet by mouth daily.     vitamin E 1000 UNIT capsule Take 1,000 Units by mouth daily.     Glucagon (GVOKE HYPOPEN 2-PACK) 1 MG/0.2ML SOAJ Inject 1 mg into the skin as needed for low blood sugar. (Patient not taking: Reported on 07/18/2021) 0.4 mL 1   No facility-administered medications prior to visit.   Past Medical History:  Diagnosis Date   Abdominal pain 02/28/2016   Abnormal CT scan, bladder    Noted 07/28/13 CT - s/p uro eval 08/2013 [place] -    Acquired trigger finger of left ring finger 02/18/2019   Injected February 18, 2019 and March 2022   Allergic rhinitis    Anemia    Arthritis    Bipolar disorder 07/05/2010   Cataract    Chronic lower back pain 07/05/2010   Chronic neck pain    Diabetic gastroparesis associated with type 1 diabetes mellitus 10/24/2016   Fibromyalgia    Generalized anxiety disorder 04/27/2014   GERD (gastroesophageal reflux disease) 07/05/2010   Hepatitis B virus infection 07/05/2010   History of substance abuse    opiates; cocaine   Hypoglycemia unawareness associated with type 1 diabetes mellitus 11/23/2020   Insomnia 04/13/2015   Major depressive disorder 07/28/2014   Migraine headache 07/05/2010   Mild neurocognitive disorder due to multiple etiologies 03/15/2021   Mixed diabetic hyperlipidemia associated with type 1 diabetes mellitus 07/05/2010   Neuropathy    Protein calorie malnutrition 06/01/2017   Right shoulder pain 02/18/2018   Seizures    pt states, "if  my blood sugar drops to the 20's, I convulse.  It hasn't happened in a long time."   Smokers' cough 07/05/2010   Tubular adenoma of colon 2018   Type 1 diabetes mellitus with complication 15/83/0940   WBC decreased 03/27/2017   Past Surgical History:  Procedure Laterality Date   CESAREAN SECTION     COLONOSCOPY     TUBAL LIGATION     UPPER GASTROINTESTINAL ENDOSCOPY     Social History   Socioeconomic History   Marital status: Divorced    Spouse name: Not on file   Number of children: 1   Years of education: 31  Highest education level: High school graduate  Occupational History   Occupation: Disability  Tobacco Use   Smoking status: Every Day    Packs/day: 1.50    Years: 50.00    Pack years: 75.00    Types: Cigarettes   Smokeless tobacco: Never  Vaping Use   Vaping Use: Former  Substance and Sexual Activity   Alcohol use: Yes    Alcohol/week: 7.0 standard drinks    Types: 7 Cans of beer per week    Comment: 1 beer nightly   Drug use: Not Currently    Types: Cocaine    Comment: past opiate and cocaine abuse   Sexual activity: Not Currently    Birth control/protection: Surgical  Other Topics Concern   Not on file  Social History Narrative   Divorced, disabled   Lives with parents   Right handed    Social Determinants of Health   Financial Resource Strain: Not on file  Food Insecurity: Not on file  Transportation Needs: Not on file  Physical Activity: Not on file  Stress: Not on file  Social Connections: Not on file   Family History  Problem Relation Age of Onset   Arthritis Mother    Diverticulosis Mother    Kidney disease Mother    Hyperlipidemia Mother    Arthritis Father    Kidney disease Father    Kidney cancer Father    Bladder Cancer Sister    Colon cancer Sister 19   Celiac disease Sister    Heart attack Brother    Kidney cancer Paternal Aunt    Diabetes Niece    Hyperlipidemia Other    Anxiety disorder Neg Hx    Bipolar disorder Neg Hx     Depression Neg Hx    Breast cancer Neg Hx    Esophageal cancer Neg Hx    Stomach cancer Neg Hx    Rectal cancer Neg Hx        Review of Systems: Pertinent positive and negative review of systems were noted in the above HPI section. All other review of systems were otherwise negative.    Physical Exam: General: Well developed, thin, no acute distress Head: Normocephalic and atraumatic Eyes: Sclerae anicteric, EOMI Ears: Normal auditory acuity Mouth: Not examined, mask on during Covid-19 pandemic Neck: Supple, no masses or thyromegaly Lungs: Clear throughout to auscultation Heart: Regular rate and rhythm; no rubs or bruits: soft systolic murmur Abdomen: Soft, non tender and non distended. No masses, hepatosplenomegaly or hernias noted. Normal Bowel sounds Rectal: Not done  Musculoskeletal: Symmetrical with no gross deformities  Skin: No lesions on visible extremities Pulses:  Normal pulses noted Extremities: No clubbing, cyanosis, edema or deformities noted Neurological: Alert oriented x 4, grossly nonfocal Cervical Nodes:  No significant cervical adenopathy Inguinal Nodes: No significant inguinal adenopathy Psychological:  Alert and cooperative. Normal mood and affect   Assessment and Recommendations:  Chronic lower abdominal pain, diarrhea (mainly in the mornings), mild weight loss.  Chart review shows a 5 pound weight loss since July 2020.  Pancreatic insufficiency is listed as a diagnosis however this has not been definitely established.  Her fecal elastase was in the low normal range at 238 in 2018.  DC dicyclomine.  Schedule CTAP.  GI stool profile, fecal elastase, fecal calprotectin.  Begin glycopyrrolate 2 mg p.o. twice daily.  Avoid foods and beverages that worsen symptoms.  If her fecal elastase is low or in the low normal range will recommend resuming Creon and assessing her  response.   GERD. Gastroparesis has not been definitely established however she may have it.  GES in October 2003 was normal.  She is not taking metoclopramide regularly.  DC metoclopramide and assess response.  Follow antireflux measures and continue omeprazole 40 mg p.o. twice daily.  If nausea, vomiting, early satiety develop recommend beginning a gastroparesis diet and scheduling a GES. Personal history of adenomatous colon polyps. Surveillance colonoscopy is recommended in 05/2024.  DM.  Follow-up with Dr. Dwyane Dee.   cc: [name], MD [address] [place],  [place] [address]

## 2021-07-18 NOTE — Patient Instructions (Signed)
Your provider has requested that you go to the basement level for lab work before leaving today. Press "B" on the elevator. The lab is located at the first door on the left as you exit the elevator.  Stop taking metoclopramide and dicyclomine.   We have sent the following medications to your pharmacy for you to pick up at your convenience: glycopyrrolate.   You have been scheduled for a CT scan of the abdomen and pelvis at [facility] ([address]---this is in the same building as [facility]).   You are scheduled on 08/01/21 at 2:30pm. You should arrive 15 minutes prior to your appointment time for registration. Please follow the written instructions below on the day of your exam:  WARNING: IF YOU ARE ALLERGIC TO IODINE/X-RAY DYE, PLEASE NOTIFY RADIOLOGY IMMEDIATELY AT [phone]! YOU WILL BE GIVEN A 13 HOUR PREMEDICATION PREP.  1) Do not eat anything after 10:30am (4 hours prior to your test) 2) You have been given 2 bottles of oral contrast to drink. The solution may taste better if refrigerated, but do NOT add ice or any other liquid to this solution. Shake well before drinking.    Drink 1 bottle of contrast @ 12:30pm (2 hours prior to your exam)  Drink 1 bottle of contrast @ 1:30pm (1 hour prior to your exam)  You may take any medications as prescribed with a small amount of water, if necessary. If you take any of the following medications: METFORMIN, GLUCOPHAGE, GLUCOVANCE, AVANDAMET, RIOMET, FORTAMET, [place] MET, JANUMET, GLUMETZA or METAGLIP, you MAY be asked to HOLD this medication 48 hours AFTER the exam.  The purpose of you drinking the oral contrast is to aid in the visualization of your intestinal tract. The contrast solution may cause some diarrhea. Depending on your individual set of symptoms, you may also receive an intravenous injection of x-ray contrast/dye. Plan on being at [facility] for 30 minutes or longer, depending on  the type of exam you are having performed.  This test typically takes 30-45 minutes to complete.  If you have any questions regarding your exam or if you need to reschedule, you may call the CT department at [phone] between the hours of 8:00 am and 5:00 pm, Monday-Friday.  __________________________________________________________  Due to recent changes in healthcare laws, you may see the results of your imaging and laboratory studies on MyChart before your provider has had a chance to review them.  We understand that in some cases there may be results that are confusing or concerning to you. Not all laboratory results come back in the same time frame and the provider may be waiting for multiple results in order to interpret others.  Please give [place] 48 hours in order for your provider to thoroughly review all the results before contacting the office for clarification of your results.   The [facility] providers would like to encourage you to use [facility] to communicate with providers for non-urgent requests or questions.  Due to long hold times on the telephone, sending your provider a message by [facility] may be a faster and more efficient way to get a response.  Please allow 48 business hours for a response.  Please remember that this is for non-urgent requests.    Thank you for choosing me and [facility].  Pricilla Riffle. Dagoberto Ligas., MD., [facility]

## 2021-07-19 ENCOUNTER — Other Ambulatory Visit (HOSPITAL_COMMUNITY): Payer: Self-pay

## 2021-07-19 ENCOUNTER — Ambulatory Visit: Payer: Medicare Other | Admitting: Neurology

## 2021-07-19 LAB — TISSUE TRANSGLUTAMINASE, IGA: (tTG) Ab, IgA: <1 U/mL

## 2021-07-19 LAB — IGA: Immunoglobulin A: 216 mg/dL (ref 70–320)

## 2021-07-20 ENCOUNTER — Telehealth: Payer: Self-pay | Admitting: Gastroenterology

## 2021-07-20 NOTE — Telephone Encounter (Signed)
Patient returned your call and asked that you call her back on her home phone.  Thank you.

## 2021-07-20 NOTE — Telephone Encounter (Signed)
See results notes for details.  

## 2021-07-21 ENCOUNTER — Encounter: Payer: Self-pay | Admitting: Physician Assistant

## 2021-07-21 ENCOUNTER — Ambulatory Visit: Payer: Medicare Other | Admitting: Physician Assistant

## 2021-07-21 ENCOUNTER — Other Ambulatory Visit: Payer: Self-pay

## 2021-07-21 VITALS — BP 162/68 | HR 79 | Resp 18 | Ht 63.0 in | Wt 102.0 lb

## 2021-07-21 DIAGNOSIS — F0670 Mild neurocognitive disorder due to known physiological condition without behavioral disturbance: Secondary | ICD-10-CM

## 2021-07-21 MED ORDER — DONEPEZIL HCL 10 MG PO TABS: ORAL_TABLET | ORAL | 3 refills | Status: DC

## 2021-07-21 NOTE — Progress Notes (Signed)
Assessment/Plan:    Mild  neurocognitive disorder due to multiple etiologies   Recommendation are as follows:  Continue donepezil 10 mg daily Side effects  were discussed  Follow up in 6  months. Discussed safety both in and out of the home.  Discussed the importance of regular daily schedule with inclusion of crossword puzzles to maintain brain function.  Continue to monitor mood by Psychiatry. Continue to care for your cardiovascular risk factors Continue to follow-up with GI Stay active exercising  at least 30 minutes at least 3 times a week.  Naps should be scheduled and should be no longer than 60 minutes and should not occur after 2 PM.  Continue to monitor driving     Subjective:   Leslie Lane is a pleasant 66 year old woman with a history of type 1 diabetes mellitus, hyperlipidemia, fibromyalgia, migraines, major depressive disorder, GAD, bipolar disorder, and with a history of mild cognitive impairment last seen in July 2022.  She was replaced during that visit on donepezil 10 mg daily, reporting no side effects of the medication. She is here alone.  Previous records as well as any outside records available were reviewed prior to todays visit.  The patient states that her memory is okay, may be better than before.  She lives with her mother, which adds to stress to her life.  She is being followed by psychiatry, and she is trying to adhere to the medication regimens.  She denies any worsening of depression, but she does have some irritability.  She denies any hallucinations or paranoia.  She sleeps better with trazodone.  Denies vivid dreams or sleepwalking.  She is independent of dressing and bathing, and showers every day.  She denies missing any medications, and uses a pillbox.  Denies leaving objects in unusual places.  She controls her own finances without missing any bills or over paying.  She is on disability.  During the day, she reports "doing nothing, may be a visit a  friend ".  She does occasionally crossword puzzles or word finding. Her appetite is good, however, she has some GI issues, "and poop what ever I eat, that is why they GI doctor is following me".  She cooks occasionally, most of the cooking is on by her mother.  She denies any trouble swallowing.  She denies leaving the stove or the faucet on.  She ambulates without difficulty without a walker or a cane.  She continues to drive without getting lost, mostly to familiar places, she does not want to venturing unknown areas.  She denies any headaches, trauma, head injuries, double vision, dizziness, focal numbness or tingling, unilateral weakness, tremors or seizures.  Denies urine incontinence or retention.  She has intermittent periods of diarrhea due to GI problems, with ongoing testing, including elastase, fecal calprotectin, stool profile among other testing.  She is to start Robinul soon.  She continues to smoke "like a chimney".      History on Initial Assessment 12/18/2018: This is a 66 year old woman with a history of type I DM, hyperlipidemia, fibromyalgia, migraines, major depressive disorder, GAD, rule out bipolar disorder, presenting for evaluation of memory loss. She is alone in the office with no family to corroborate history. She states memory changes started after she was in the hospital in a diabetic coma at age 66. She states she has not noticed any changes since then, memory has been about the same since then. She initially could not drive a car or  remember a movie she had watched. She acquired a guardian to manage her finances. She lives with her mother. She has been told she repeats herself and says the same thing every morning. She used to leave the stove on, but has been more vigilant. She has been driving and has gotten lost driving in unfamiliar roads. She manages her medications independently. She states she does not like math and was not good in school. She has reported her memory concerns to  her psychiatrist and reported that she cannot focus on anything. She is easily irritable. No paranoia or hallucinations. Sleep is good with Trazodone. No family history of dementia. No history of significant head injuries. She used to drink heavily when younger, then cut down to a beer every now and then after her diabetic coma.    She has headaches that improve when she is not smoking. She has a history of migraines and has mild bitemporal headaches, taking daily magnesium. She has chronic neck and back pain and neuropathy in her feet. No dizziness, vision changes, focal weakness, bowel/bladder dysfunction, anosmia, or tremors.      Neurocognitive testing 03/29/21 Dr. Melvyn Novas Briefly, [name] exhibited very limited testing tolerance and abruptly discontinued the evaluation prematurely. Unfortunately, given the abbreviated nature of testing, several domains were unable to be thoroughly assessed and the current conceptualization is limited by this. [name] pattern of performance is suggestive of prominent difficulties with basic attention, expressive language, visuospatial abilities, and encoding (i.e., learning) aspects of memory. Overall, remote substance abuse history, as well as chronic and acute psychiatric distress, is very likely exacerbating deficits related to various vascular conditions. Ongoing sleep dysfunction, chronic pain, and frequent headaches would also contribute to dysfunction.   PREVIOUS MEDICATIONS: none  CURRENT MEDICATIONS:  Outpatient Encounter Medications as of 07/21/2021  Medication Sig   Accu-Chek Softclix Lancets lancets Use as instructed to check blood sugar 4 times per day dx code E10.65   Alcohol Swabs (B-D SINGLE USE SWABS REGULAR) PADS USE FOUR TIMES DAILY   aspirin EC 81 MG tablet Take 81 mg by mouth every morning.   Blood Glucose Calibration (ACCU-CHEK AVIVA) SOLN USE AS DIRECTED   Blood Glucose Monitoring Suppl (ACCU-CHEK AVIVA PLUS) w/Device KIT USE AS DIRECTED    diclofenac Sodium (VOLTAREN) 1 % GEL APPLY 2 GRAMS EXTERNALLY TO THE AFFECTED AREA FOUR TIMES DAILY   DULoxetine (CYMBALTA) 30 MG capsule Take 1 capsule (30 mg total) by mouth daily.   Glucagon (GVOKE HYPOPEN 2-PACK) 1 MG/0.2ML SOAJ INJECT 1 MG UNDER THE SKIN AS NEEDED FOR LOW BLOOD SUGAR   Glucagon (GVOKE HYPOPEN 2-PACK) 1 MG/0.2ML SOAJ Inject 1 mg into the skin as needed for low blood sugar.   glucose blood (ACCU-CHEK AVIVA PLUS) test strip TEST BLOOD SUGAR FOUR TIMES DAILY   glycopyrrolate (ROBINUL) 2 MG tablet Take 1 tablet (2 mg total) by mouth 2 (two) times daily.   insulin detemir (LEVEMIR FLEXTOUCH) 100 UNIT/ML FlexPen Inject 3 units in the morning and 6 units at bedtime (Patient taking differently: Inject 3 units in the morning and 7 units at bedtime)   insulin lispro (HUMALOG KWIKPEN) 100 UNIT/ML KwikPen inject 1-3 UNITS tid for large meals   Insulin Pen Needle 32G X 4 MM MISC Use inject insulin tid   lamoTRIgine (LAMICTAL) 100 MG tablet Take 1 tablet (100 mg total) by mouth 2 (two) times daily.   lidocaine (LIDODERM) 5 % UNWRAP AND APPLY 1 PATCH TO SKIN EVERY DAY(REMOVE AND DISCARD PATCH WITHIN  12 HOURS OR AS DIRECTED BY DOCTOR)   lipase/protease/amylase (CREON) 36000 UNITS CPEP capsule Take 2 capsules (72,000 Units total) by mouth 3 (three) times daily before meals.   loratadine (CLARITIN) 10 MG tablet Take 10 mg by mouth as needed for allergies.   lovastatin (MEVACOR) 40 MG tablet Take 1 tablet (40 mg total) by mouth at bedtime.   Magnesium 500 MG TABS Take 1 tablet by mouth as needed.   Multiple Vitamin (MULTIVITAMIN) tablet Take 1 tablet by mouth daily. Reported on 11/22/2015   Omega-3 Fatty Acids (FISH OIL) 1000 MG CAPS Take 1 capsule by mouth daily.   omeprazole (PRILOSEC) 40 MG capsule Take 1 capsule (40 mg total) by mouth in the morning and at bedtime.   traZODone (DESYREL) 100 MG tablet Take 2 tablets (200 mg total) by mouth at bedtime.   umeclidinium-vilanterol (ANORO  ELLIPTA) 62.5-25 MCG/INH AEPB Inhale 1 puff into the lungs daily.   vitamin B-12 (CYANOCOBALAMIN) 1000 MCG tablet Take 1 tablet by mouth daily.   vitamin E 1000 UNIT capsule Take 1,000 Units by mouth daily.   [DISCONTINUED] donepezil (ARICEPT) 10 MG tablet Take 1 tablet every night   donepezil (ARICEPT) 10 MG tablet Take 1 tablet every night   No facility-administered encounter medications on file as of 07/21/2021.     Objective:     PHYSICAL EXAMINATION:    VITALS:   Vitals:   07/21/21 1457  BP: (!) 162/68  Pulse: 79  Resp: 18  SpO2: 95%  Weight: 102 lb (46.3 kg)  Height: 5' 3"  (1.6 m)     GEN:  The patient appears stated age and is in NAD.  Constantly moving, anxious appearing, thin. HEENT:  Normocephalic, atraumatic.   Neurological examination:  General: NAD, well-groomed, appears stated age. Orientation: The patient is alert. Oriented to person, place and date  Cranial nerves: There is good facial symmetry.The speech is fluent and clear. No aphasia or dysarthria. Fund of knowledge is appropriate. Recent memory is reduced, but remote memory is intact. Attention and concentration are impaired. Able to name objects and repeat phrases.  Hearing is intact to conversational tone.  Sensation: Sensation is intact to light touch throughout Motor: Strength is at least antigravity x4.  Montreal Cognitive Assessment  07/30/2019 12/18/2018  Visuospatial/ Executive (0/5) - 1  Naming (0/3) - 3  Attention: Read list of digits (0/2) 1 1  Attention: Read list of letters (0/1) 1 1  Attention: Serial 7 subtraction starting at 100 (0/3) 1 2  Language: Repeat phrase (0/2) 0 0  Language : Fluency (0/1) 0 0  Abstraction (0/2) 2 0  Delayed Recall (0/5) 4 0  Orientation (0/6) 6 5  Total - 13  Adjusted Score (based on education) - 14     MMSE - [place] Exam 12/03/2020 05/30/2017  Orientation to time 5 5  Orientation to Place 5 5  Registration 3 3  Attention/ Calculation 0 3   Recall 0 2  Language- name 2 objects 2 2  Language- repeat 1 1  Language- follow 3 step command 3 3  Language- read & follow direction 1 1  Write a sentence 1 1  Copy design 1 1  Total score 22 27      Movement examination: Tone: There is normal tone in the UE/LE Abnormal movements:  no tremor.  No myoclonus.  No asterixis.   Coordination:  There is no decremation with RAM's. Normal finger to nose  Gait and Station: The patient  has no difficulty arising out of a deep-seated chair without the use of the hands. The patient's stride length is good.  Gait is cautious and narrow.      Total time spent on today's visit was 30 minutes, including both face-to-face time and nonface-to-face time.  Time included that spent on review of records (prior notes available to me/labs/imaging if pertinent), discussing treatment and goals, answering patient's questions and coordinating care.  Cc:  [name], MD Sharene Butters, PA-C

## 2021-07-21 NOTE — Patient Instructions (Addendum)
It was a pleasure to see you today at our office.   Recommendations:  Meds: Continue  Donepezil 10 mg: take 1 tablet every night Follow up in 6 months  Follow up with psychiatry, GI, Primary doctors    RECOMMENDATIONS FOR ALL PATIENTS WITH MEMORY PROBLEMS: 1. Continue to exercise (Recommend 30 minutes of walking everyday, or 3 hours every week) 2. Increase social interactions - continue going to [place] and enjoy social gatherings with friends and family 3. Eat healthy, avoid fried foods and eat more fruits and vegetables 4. Maintain adequate blood pressure, blood sugar, and blood cholesterol level. Reducing the risk of stroke and cardiovascular disease also helps promoting better memory. 5. Avoid stressful situations. Live a simple life and avoid aggravations. Organize your time and prepare for the next day in anticipation. 6. Sleep well, avoid any interruptions of sleep and avoid any distractions in the bedroom that may interfere with adequate sleep quality 7. Avoid sugar, avoid sweets as there is a strong link between excessive sugar intake, diabetes, and cognitive impairment We discussed the Mediterranean diet, which has been shown to help patients reduce the risk of progressive memory disorders and reduces cardiovascular risk. This includes eating fish, eat fruits and green leafy vegetables, nuts like almonds and hazelnuts, walnuts, and also use olive oil. Avoid fast foods and fried foods as much as possible. Avoid sweets and sugar as sugar use has been linked to worsening of memory function.  There is always a concern of gradual progression of memory problems. If this is the case, then we may need to adjust level of care according to patient needs. Support, both to the patient and caregiver, should then be put into place.      You have been referred for a neuropsychological evaluation (i.e., evaluation of memory and thinking abilities). Please bring someone with you to this appointment  if possible, as it is helpful for the doctor to hear from both you and another adult who knows you well. Please bring eyeglasses and hearing aids if you wear them.    The evaluation will take approximately 3 hours and has two parts:   The first part is a clinical interview with the neuropsychologist (Dr. Melvyn Novas or Dr. Nicole Kindred). During the interview, the neuropsychologist will speak with you and the individual you brought to the appointment.    The second part of the evaluation is testing with the doctor's technician Hinton Dyer or Maudie Mercury). During the testing, the technician will ask you to remember different types of material, solve problems, and answer some questionnaires. Your family member will not be present for this portion of the evaluation.   Please note: We must reserve several hours of the neuropsychologist's time and the psychometrician's time for your evaluation appointment. As such, there is a No-Show fee of $100. If you are unable to attend any of your appointments, please contact our office as soon as possible to reschedule.    FALL PRECAUTIONS: Be cautious when walking. Scan the area for obstacles that may increase the risk of trips and falls. When getting up in the mornings, sit up at the edge of the bed for a few minutes before getting out of bed. Consider elevating the bed at the head end to avoid drop of blood pressure when getting up. Walk always in a well-lit room (use night lights in the walls). Avoid area rugs or power cords from appliances in the middle of the walkways. Use a walker or a cane if necessary and consider  physical therapy for balance exercise. Get your eyesight checked regularly.  FINANCIAL OVERSIGHT: Supervision, especially oversight when making financial decisions or transactions is also recommended.  HOME SAFETY: Consider the safety of the kitchen when operating appliances like stoves, microwave oven, and blender. Consider having supervision and share cooking  responsibilities until no longer able to participate in those. Accidents with firearms and other hazards in the house should be identified and addressed as well.   ABILITY TO BE LEFT ALONE: If patient is unable to contact 911 operator, consider using LifeLine, or when the need is there, arrange for someone to stay with patients. Smoking is a fire hazard, consider supervision or cessation. Risk of wandering should be assessed by caregiver and if detected at any point, supervision and safe proof recommendations should be instituted.  MEDICATION SUPERVISION: Inability to self-administer medication needs to be constantly addressed. Implement a mechanism to ensure safe administration of the medications.   DRIVING: Regarding driving, in patients with progressive memory problems, driving will be impaired. We advise to have someone else do the driving if trouble finding directions or if minor accidents are reported. Independent driving assessment is available to determine safety of driving.   If you are interested in the driving assessment, you can contact the following:  [employer] in [place]  [place] [phone] or [phone]    [place] refers to food and lifestyle choices that are based on the traditions of countries located on the [employer]. This way of eating has been shown to help prevent certain conditions and improve outcomes for people who have chronic diseases, like kidney disease and heart disease. What are tips for following this plan? Lifestyle  Cook and eat meals together with your family, when possible. Drink enough fluid to keep your urine clear or pale yellow. Be physically active every day. This includes: Aerobic exercise like running or swimming. Leisure activities like gardening, walking, or housework. Get 7-8  hours of sleep each night. If recommended by your health care provider, drink red wine in moderation. This means 1 glass a day for nonpregnant women and 2 glasses a day for men. A glass of wine equals 5 oz (150 mL). Reading food labels  Check the serving size of packaged foods. For foods such as rice and pasta, the serving size refers to the amount of cooked product, not dry. Check the total fat in packaged foods. Avoid foods that have saturated fat or trans fats. Check the ingredients list for added sugars, such as corn syrup. Shopping  At the grocery store, buy most of your food from the areas near the walls of the store. This includes: Fresh fruits and vegetables (produce). Grains, beans, nuts, and seeds. Some of these may be available in unpackaged forms or large amounts (in bulk). Fresh seafood. Poultry and eggs. Low-fat dairy products. Buy whole ingredients instead of prepackaged foods. Buy fresh fruits and vegetables in-season from local farmers markets. Buy frozen fruits and vegetables in resealable bags. If you do not have access to quality fresh seafood, buy precooked frozen shrimp or canned fish, such as tuna, salmon, or sardines. Buy small amounts of raw or cooked vegetables, salads, or olives from the deli or salad bar at your store. Stock your pantry so you always have certain foods on hand, such as olive oil, canned tuna, canned tomatoes, rice, pasta, and beans. Cooking  Cook foods with extra-virgin olive  oil instead of using butter or other vegetable oils. Have meat as a side dish, and have vegetables or grains as your main dish. This means having meat in small portions or adding small amounts of meat to foods like pasta or stew. Use beans or vegetables instead of meat in common dishes like chili or lasagna. Experiment with different cooking methods. Try roasting or broiling vegetables instead of steaming or sauteing them. Add frozen vegetables to soups, stews, pasta, or  rice. Add nuts or seeds for added healthy fat at each meal. You can add these to yogurt, salads, or vegetable dishes. Marinate fish or vegetables using olive oil, lemon juice, garlic, and fresh herbs. Meal planning  Plan to eat 1 vegetarian meal one day each week. Try to work up to 2 vegetarian meals, if possible. Eat seafood 2 or more times a week. Have healthy snacks readily available, such as: Vegetable sticks with hummus. Greek yogurt. Fruit and nut trail mix. Eat balanced meals throughout the week. This includes: Fruit: 2-3 servings a day Vegetables: 4-5 servings a day Low-fat dairy: 2 servings a day Fish, poultry, or lean meat: 1 serving a day Beans and legumes: 2 or more servings a week Nuts and seeds: 1-2 servings a day Whole grains: 6-8 servings a day Extra-virgin olive oil: 3-4 servings a day Limit red meat and sweets to only a few servings a month What are my food choices? Mediterranean diet Recommended Grains: Whole-grain pasta. Brown rice. Bulgar wheat. Polenta. Couscous. Whole-wheat bread. [place]. Vegetables: Artichokes. Beets. Broccoli. Cabbage. Carrots. Eggplant. Green beans. Chard. Kale. Spinach. Onions. Leeks. Peas. Squash. Tomatoes. Peppers. Radishes. Fruits: Apples. Apricots. Avocado. Berries. Bananas. Cherries. Dates. Figs. Grapes. Lemons. Melon. Oranges. Peaches. Plums. Pomegranate. Meats and other protein foods: Beans. Almonds. Sunflower seeds. Pine nuts. Peanuts. [place]. Salmon. Scallops. Shrimp. [place]. Tilapia. Clams. Oysters. Eggs. Dairy: Low-fat milk. Cheese. Greek yogurt. Beverages: Water. Red wine. Herbal tea. Fats and oils: Extra virgin olive oil. Avocado oil. Grape seed oil. Sweets and desserts: [place] yogurt with honey. Baked apples. Poached pears. Trail mix. Seasoning and other foods: Basil. Cilantro. Coriander. Cumin. Mint. Parsley. Sage. Rosemary. Tarragon. Garlic. Oregano. Thyme. Pepper. Balsalmic vinegar. Tahini. Hummus. Tomato sauce. Olives.  Mushrooms. Limit these Grains: Prepackaged pasta or rice dishes. Prepackaged cereal with added sugar. Vegetables: Deep fried potatoes (french fries). Fruits: Fruit canned in syrup. Meats and other protein foods: Beef. Pork. Lamb. Poultry with skin. Hot dogs. [place] Salines. Dairy: Ice cream. Sour cream. Whole milk. Beverages: Juice. Sugar-sweetened soft drinks. Beer. Liquor and spirits. Fats and oils: Butter. Canola oil. Vegetable oil. Beef fat (tallow). Lard. Sweets and desserts: Cookies. Cakes. Pies. Candy. Seasoning and other foods: Mayonnaise. Premade sauces and marinades. The items listed may not be a complete list. Talk with your dietitian about what dietary choices are right for you. Summary The Mediterranean diet includes both food and lifestyle choices. Eat a variety of fresh fruits and vegetables, beans, nuts, seeds, and whole grains. Limit the amount of red meat and sweets that you eat. Talk with your health care provider about whether it is safe for you to drink red wine in moderation. This means 1 glass a day for nonpregnant women and 2 glasses a day for men. A glass of wine equals 5 oz (150 mL). This information is not intended to replace advice given to you by your health care provider. Make sure you discuss any questions you have with your health care provider. Document Released: 02/10/2016 Document Revised: 03/14/2016 Document Reviewed: 02/10/2016  Chartered certified accountant Patient Education  [employer].

## 2021-07-22 ENCOUNTER — Telehealth: Payer: Self-pay

## 2021-07-22 NOTE — Telephone Encounter (Signed)
PA for glycopyrrolate has been denied. Please advise what alternate medication I can send to the pharmacy. Patient has previously tried and failed dicyclomine.

## 2021-07-22 NOTE — Telephone Encounter (Signed)
Levbid 0.375 mg po bid, 1 year of refills

## 2021-07-22 NOTE — Telephone Encounter (Signed)
Left message for patient to return my call.

## 2021-07-25 NOTE — Telephone Encounter (Signed)
Left message for patient to return my call.

## 2021-07-26 MED ORDER — HYOSCYAMINE SULFATE ER 0.375 MG PO TB12: 0.3750 mg | ORAL_TABLET | Freq: Two times a day (BID) | ORAL | 11 refills | Status: DC

## 2021-07-26 NOTE — Telephone Encounter (Signed)
Left message for patient to return my call. Unreturned call from patient. Will send medication in and wait on patient to call back.

## 2021-08-01 ENCOUNTER — Other Ambulatory Visit: Payer: Self-pay

## 2021-08-01 ENCOUNTER — Ambulatory Visit (INDEPENDENT_AMBULATORY_CARE_PROVIDER_SITE_OTHER)
Admission: RE | Admit: 2021-08-01 | Discharge: 2021-08-01 | Disposition: A | Discharge status: Home / Self Care | Payer: Medicare Other | Source: Ambulatory Visit | Attending: Gastroenterology | Admitting: Gastroenterology

## 2021-08-01 ENCOUNTER — Ambulatory Visit: Payer: Medicare Other | Admitting: Pulmonary Disease

## 2021-08-01 DIAGNOSIS — R197 Diarrhea, unspecified: Secondary | ICD-10-CM

## 2021-08-01 DIAGNOSIS — R109 Unspecified abdominal pain: Secondary | ICD-10-CM | POA: Diagnosis not present

## 2021-08-01 DIAGNOSIS — R1030 Lower abdominal pain, unspecified: Secondary | ICD-10-CM

## 2021-08-01 DIAGNOSIS — I700 Atherosclerosis of aorta: Secondary | ICD-10-CM | POA: Diagnosis not present

## 2021-08-01 MED ORDER — IOHEXOL 300 MG/ML  SOLN
100.0000 mL | Freq: Once | INTRAMUSCULAR | Status: AC | PRN
  Administered 2021-08-01: 80 mL via INTRAVENOUS

## 2021-08-02 ENCOUNTER — Ambulatory Visit: Payer: Medicare Other | Admitting: Pulmonary Disease

## 2021-08-02 ENCOUNTER — Encounter: Payer: Self-pay | Admitting: Pulmonary Disease

## 2021-08-02 VITALS — BP 120/62 | HR 82 | Temp 98.2°F | Ht 63.0 in | Wt 102.6 lb

## 2021-08-02 DIAGNOSIS — F17210 Nicotine dependence, cigarettes, uncomplicated: Secondary | ICD-10-CM

## 2021-08-02 DIAGNOSIS — J42 Unspecified chronic bronchitis: Secondary | ICD-10-CM

## 2021-08-02 DIAGNOSIS — Z716 Tobacco abuse counseling: Secondary | ICD-10-CM

## 2021-08-02 NOTE — Patient Instructions (Addendum)
Nice to see you again  Try reducing number of cigarettes you smoke by 2 a day every week until down to 10 cigarettes a day then go down by 1 cigarette a day every week until you are quit.   No medication changes - Continue Anoro once a day  Return to clinic in 6 months or sooner as needed

## 2021-08-02 NOTE — Progress Notes (Signed)
@Patient  ID: Leslie Lane, female    DOB: 05-Dec-1955, 66 y.o.   MRN: [id number]  Chief Complaint  Patient presents with   Follow-up    Follow up for cough. Pt states that sometimes she will cough up phlegm and uses the inhaler and it does help     Referring provider: [name], *  HPI:   66 y.o. woman whom we are seeing in follow up for evaluation of chronic cough.    Overall doing okay.  Started Anoro last visit.  Cough a bit better.  Not overwhelmingly so.  Still with daily cough.  Occasionally productive.  Not really bothered with it.  She feels like the sensation of coughing out sputum is healthy and good for her lungs.  She underwent lung cancer screening CT 03/2021.  Reviewed images.  Lung RADS 2 recommend yearly follow-up.  Discussed at length her ongoing smoking.  Still smoking over a pack and half a day, been more from initial visit.  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  Has quit cold [place] in the past with significant withdrawal symptoms and did not feel well.  We discussed ideas to decrease cigarette smoking today as below.  HPI at initial visit: Patient feels okay.  Denies any dyspnea on exertion.  Does have a cough for about 3 to 4 years.  Daily cough, worse in the morning.  Productive of sputum.  No clear alleviating or exacerbating factors.  No environmental or seasonal changes make things better or worse.  No position that make things better or worse.  She has a history of GERD but symptoms well controlled on PPI.  She denies seasonal allergies, rhinorrhea, nasal congestion.  No recent weight loss.  No chest imaging since 2019.  Chest x-ray 2019 reviewed interpreted as clear lungs, hyperinflation.  Current everyday smoker.  About a pack and a half a day.  Precontemplative in terms of quitting.  Discussed risk and benefits of smoking cessation at length.  Introduced idea of lung cancer screening.  She is interested in further  discussion.  PMH: Tobacco abuse, diabetes, Surgical history: C-section, colonoscopy, tubal ligation Family history: Mother with arthritis, father with arthritis, kidney disease, brother with CAD Social history: Current smoker, approximately 75-pack-year, lives in [place] / Pulmonary Flowsheets:   ACT:  No flowsheet data found.  MMRC: No flowsheet data found.  Epworth:  No flowsheet data found.  Tests:   FENO:  No results found for: NITRICOXIDE  PFT: No flowsheet data found.  WALK:  No flowsheet data found.  Imaging: Personally reviewed as per EMR discussion this note  Lab Results: Personally reviewed CBC    Component Value Date/Time   WBC 4.4 06/10/2020 1343   RBC 4.37 06/10/2020 1343   HGB 12.0 06/10/2020 1343   HCT 36.7 06/10/2020 1343   PLT 237.0 06/10/2020 1343   MCV 84.2 06/10/2020 1343   MCH 26.0 05/02/2015 2214   MCHC 32.5 06/10/2020 1343   RDW 16.5 (H) 06/10/2020 1343   LYMPHSABS 1.8 03/20/2017 1109   MONOABS 0.4 03/20/2017 1109   EOSABS 0.1 03/20/2017 1109   BASOSABS 0.1 03/20/2017 1109    BMET    Component Value Date/Time   NA 138 06/07/2021 1520   K 4.6 06/07/2021 1520   CL 99 06/07/2021 1520   CO2 32 06/07/2021 1520   GLUCOSE 115 (H) 06/07/2021 1520   BUN 24 (H) 07/18/2021 0929   CREATININE 0.78 07/18/2021 0929  CALCIUM 10.0 06/07/2021 1520   GFRNONAA >60 05/02/2015 2214   GFRAA >60 05/02/2015 2214    BNP No results found for: BNP  ProBNP No results found for: PROBNP  Specialty Problems       Pulmonary Problems   Smokers' cough ([place])    Annotation: opiate and cocaine abuse Qualifier: Diagnosis of  By: Asa Lente MD, Valerie A        Allergies  Allergen Reactions   Penicillins Anaphylaxis   Sulfonamide Derivatives Anaphylaxis    Immunization History  Administered Date(s) Administered   Fluad Quad(high Dose 65+) 04/24/2019, 04/20/2020, 04/28/2021   Influenza Split 03/22/2011, 03/26/2012    Influenza,inj,Quad PF,6+ Mos 02/28/2013, 04/13/2014, 05/13/2015, 02/28/2016, 03/26/2017, 06/04/2018   PFIZER(Purple Top)SARS-COV-2 Vaccination 10/01/2019, 10/29/2019, 06/21/2020   Pneumococcal Conjugate-13 07/24/2016   Pneumococcal Polysaccharide-23 07/31/2013, 06/17/2019   Td 07/28/2011   Zoster, Live 05/31/2016    Past Medical History:  Diagnosis Date   Abdominal pain 02/28/2016   Abnormal CT scan, bladder    Noted 07/28/13 CT - s/p uro eval 08/2013 [facility] -    Acquired trigger finger of left ring finger 02/18/2019   Injected February 18, 2019 and March 2022   Allergic rhinitis    Anemia    Arthritis    Bipolar disorder 07/05/2010   Cataract    Chronic lower back pain 07/05/2010   Chronic neck pain    Diabetic gastroparesis associated with type 1 diabetes mellitus 10/24/2016   Fibromyalgia    Generalized anxiety disorder 04/27/2014   GERD (gastroesophageal reflux disease) 07/05/2010   Hepatitis B virus infection 07/05/2010   History of substance abuse    opiates; cocaine   Hypoglycemia unawareness associated with type 1 diabetes mellitus 11/23/2020   Insomnia 04/13/2015   Major depressive disorder 07/28/2014   Migraine headache 07/05/2010   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  It hasn't happened in a long time."   Smokers' cough 07/05/2010   Tubular adenoma of colon 2018   Type 1 diabetes mellitus with complication 04/54/0981   WBC decreased 03/27/2017    Tobacco History: Social History   Tobacco Use  Smoking Status Every Day   Packs/day: 1.50   Years: 50.00   Pack years: 75.00   Types: Cigarettes  Smokeless Tobacco Never   Ready to quit: Not Answered Counseling given: Not  Answered   Continue to not smoke  Outpatient Encounter Medications as of 08/02/2021  Medication Sig   Accu-Chek Softclix Lancets lancets Use as instructed to check blood sugar 4 times per day dx code E10.65   Alcohol Swabs (B-D SINGLE USE SWABS REGULAR) PADS USE FOUR TIMES DAILY   aspirin EC 81 MG tablet Take 81 mg by mouth every morning.   Blood Glucose Calibration (ACCU-CHEK AVIVA) SOLN USE AS DIRECTED   Blood Glucose Monitoring Suppl (ACCU-CHEK AVIVA PLUS) w/Device KIT USE AS DIRECTED   diclofenac Sodium (VOLTAREN) 1 % GEL APPLY 2 GRAMS EXTERNALLY TO THE AFFECTED AREA FOUR TIMES DAILY   donepezil (ARICEPT) 10 MG tablet Take 1 tablet every night   DULoxetine (CYMBALTA) 30 MG capsule Take 1 capsule (30 mg total) by mouth daily.   Glucagon (GVOKE HYPOPEN 2-PACK) 1 MG/0.2ML SOAJ INJECT 1 MG UNDER THE SKIN AS  NEEDED FOR LOW BLOOD SUGAR   Glucagon (GVOKE HYPOPEN 2-PACK) 1 MG/0.2ML SOAJ Inject 1 mg into the skin as needed for low blood sugar.   glucose blood (ACCU-CHEK AVIVA PLUS) test strip TEST BLOOD SUGAR FOUR TIMES DAILY   hyoscyamine (LEVBID) 0.375 MG 12 hr tablet Take 1 tablet (0.375 mg total) by mouth 2 (two) times daily.   insulin detemir (LEVEMIR FLEXTOUCH) 100 UNIT/ML FlexPen Inject 3 units in the morning and 6 units at bedtime (Patient taking differently: Inject 3 units in the morning and 7 units at bedtime)   insulin lispro (HUMALOG KWIKPEN) 100 UNIT/ML KwikPen inject 1-3 UNITS tid for large meals   Insulin Pen Needle 32G X 4 MM MISC Use inject insulin tid   lamoTRIgine (LAMICTAL) 100 MG tablet Take 1 tablet (100 mg total) by mouth 2 (two) times daily.   lidocaine (LIDODERM) 5 % UNWRAP AND APPLY 1 PATCH TO SKIN EVERY DAY(REMOVE AND DISCARD PATCH WITHIN 12 HOURS OR AS DIRECTED BY DOCTOR)   lipase/protease/amylase (CREON) 36000 UNITS CPEP capsule Take 2 capsules (72,000 Units total) by mouth 3 (three) times daily before meals.   loratadine (CLARITIN) 10 MG tablet Take 10 mg by mouth as  needed for allergies.   lovastatin (MEVACOR) 40 MG tablet Take 1 tablet (40 mg total) by mouth at bedtime.   Magnesium 500 MG TABS Take 1 tablet by mouth as needed.   Multiple Vitamin (MULTIVITAMIN) tablet Take 1 tablet by mouth daily. Reported on 11/22/2015   Omega-3 Fatty Acids (FISH OIL) 1000 MG CAPS Take 1 capsule by mouth daily.   omeprazole (PRILOSEC) 40 MG capsule Take 1 capsule (40 mg total) by mouth in the morning and at bedtime.   traZODone (DESYREL) 100 MG tablet Take 2 tablets (200 mg total) by mouth at bedtime.   umeclidinium-vilanterol (ANORO ELLIPTA) 62.5-25 MCG/INH AEPB Inhale 1 puff into the lungs daily.   vitamin B-12 (CYANOCOBALAMIN) 1000 MCG tablet Take 1 tablet by mouth daily.   vitamin E 1000 UNIT capsule Take 1,000 Units by mouth daily.   No facility-administered encounter medications on file as of 08/02/2021.     Review of Systems  Review of Systems  No chest pain exertion, no orthopnea or PND.  Comprehensive review of systems otherwise negative. Physical Exam  BP 120/62 (BP Location: Left Arm, Patient Position: Sitting, Cuff Size: Normal)    Pulse 82    Temp 98.2 F (36.8 C) (Oral)    Ht 5' 3"  (1.6 m)    Wt 102 lb 9.6 oz (46.5 kg)    SpO2 97%    BMI 18.17 kg/m   Wt Readings from Last 5 Encounters:  08/02/21 102 lb 9.6 oz (46.5 kg)  07/21/21 102 lb (46.3 kg)  07/18/21 104 lb 6 oz (47.3 kg)  06/17/21 101 lb (45.8 kg)  06/09/21 101 lb (45.8 kg)    BMI Readings from Last 5 Encounters:  08/02/21 18.17 kg/m  07/21/21 18.07 kg/m  07/18/21 18.06 kg/m  06/17/21 17.61 kg/m  06/09/21 17.61 kg/m     Physical Exam General: Well-appearing, no acute distress Eyes: EOMI, icterus Neck: Supple, no JVP Pulmonary: Clear, normal work of breathing Cardiovascular: Regular rate and rhythm, no murmur Abdomen: Nondistended, bowel sounds present MSK: No synovitis, no joint effusion Neuro: Normal gait, no weakness Psych: Normal mood, full affect   Assessment &  Plan:   Chronic bronchitis: Daily mucus production for about 3 years.  Related to cigarette smoking.  Chest x-ray 12/2020 okay.  Continue Anoro  given mild reduction in daily cough.  Consider escalation to triple inhaled therapy if cough worsens or is more bothersome in the future.  Lung cancer screening: Lung RADS 2 03/2021.  Yearly follow-up CT scan scheduled 03/2022.  Encouraged her to follow-up with Eric Form, NP in  lung cancer screening clinic.  Smoking assessment and cessation counseling Patient currently smoking: I have advised the patient to quit/stop smoking as soon as possible due to high risk for multiple medical problems.  It will also be very difficult for [place] to manage patient's  respiratory symptoms and status if we continue to expose her lungs to a known irritant.  We do not advise e-cigarettes as a form of stopping smoking. Patient is  willing to quit smoking.I have advised the patient that we can assist and have options of nicotine replacement therapy, provided smoking cessation education today, provided smoking cessation counseling, and provided cessation resources.  After shared decision making we discussed gradual reduction of cigarettes smoked each day on a weekly basis in an effort to decrease number cigarettes smoked daily and ultimately lead to abstinence.  6 minutes were spent in smoking cessation counseling.   Return in about 6 months (around 01/30/2022).   Lanier Clam, MD 08/02/2021

## 2021-08-02 NOTE — Progress Notes (Signed)
@Patient  ID: Leslie Lane, female    DOB: Jun 26, 1956, 66 y.o.   MRN: [id number]  Chief Complaint  Patient presents with   Follow-up    Follow up for cough. Pt states that sometimes she will cough up phlegm and uses the inhaler and it does help     Referring provider: [name], *  HPI:   66 year old woman whom we are seeing in consultation for evaluation of chronic cough, lung cancer screening evaluation.  Most recent note from Eric Form, NP reviewed.    HPI at initial visit: Patient feels okay.  Denies any dyspnea on exertion.  Does have a cough for about 3 to 4 years.  Daily cough, worse in the morning.  Productive of sputum.  No clear alleviating or exacerbating factors.  No environmental or seasonal changes make things better or worse.  No position that make things better or worse.  She has a history of GERD but symptoms well controlled on PPI.  She denies seasonal allergies, rhinorrhea, nasal congestion.  No recent weight loss.  No chest imaging since 2019.  Chest x-ray 2019 reviewed interpreted as clear lungs, hyperinflation.  Current everyday smoker.  About a pack and a half a day.  Precontemplative in terms of quitting.  Discussed risk and benefits of smoking cessation at length.  Introduced idea of lung cancer screening.  She is interested in further discussion.  PMH: Tobacco abuse, diabetes, Surgical history: C-section, colonoscopy, tubal ligation Family history: Mother with arthritis, father with arthritis, kidney disease, brother with CAD Social history: Current smoker, approximately 75-pack-year, lives in [place] / Pulmonary Flowsheets:   ACT:  No flowsheet data found.  MMRC: No flowsheet data found.  Epworth:  No flowsheet data found.  Tests:   FENO:  No results found for: NITRICOXIDE  PFT: No flowsheet data found.  WALK:  No flowsheet data found.  Imaging: Personally reviewed as per EMR discussion this note  Lab  Results: Personally reviewed CBC    Component Value Date/Time   WBC 4.4 06/10/2020 1343   RBC 4.37 06/10/2020 1343   HGB 12.0 06/10/2020 1343   HCT 36.7 06/10/2020 1343   PLT 237.0 06/10/2020 1343   MCV 84.2 06/10/2020 1343   MCH 26.0 05/02/2015 2214   MCHC 32.5 06/10/2020 1343   RDW 16.5 (H) 06/10/2020 1343   LYMPHSABS 1.8 03/20/2017 1109   MONOABS 0.4 03/20/2017 1109   EOSABS 0.1 03/20/2017 1109   BASOSABS 0.1 03/20/2017 1109    BMET    Component Value Date/Time   NA 138 06/07/2021 1520   K 4.6 06/07/2021 1520   CL 99 06/07/2021 1520   CO2 32 06/07/2021 1520   GLUCOSE 115 (H) 06/07/2021 1520   BUN 24 (H) 07/18/2021 0929   CREATININE 0.78 07/18/2021 0929   CALCIUM 10.0 06/07/2021 1520   GFRNONAA >60 05/02/2015 2214   GFRAA >60 05/02/2015 2214    BNP No results found for: BNP  ProBNP No results found for: PROBNP  Specialty Problems       Pulmonary Problems   Smokers' cough ([place])    Annotation: opiate and cocaine abuse Qualifier: Diagnosis of  By: Asa Lente MD, Valerie A        Allergies  Allergen Reactions   Penicillins Anaphylaxis   Sulfonamide Derivatives Anaphylaxis    Immunization History  Administered Date(s) Administered   Fluad Quad(high Dose 65+) 04/24/2019, 04/20/2020, 04/28/2021   Influenza Split 03/22/2011, 03/26/2012   Influenza,inj,Quad PF,6+ Mos 02/28/2013, 04/13/2014,  05/13/2015, 02/28/2016, 03/26/2017, 06/04/2018   PFIZER(Purple Top)SARS-COV-2 Vaccination 10/01/2019, 10/29/2019, 06/21/2020   Pneumococcal Conjugate-13 07/24/2016   Pneumococcal Polysaccharide-23 07/31/2013, 06/17/2019   Td 07/28/2011   Zoster, Live 05/31/2016    Past Medical History:  Diagnosis Date   Abdominal pain 02/28/2016   Abnormal CT scan, bladder    Noted 07/28/13 CT - s/p uro eval 08/2013 [facility] -    Acquired trigger finger of left ring finger 02/18/2019   Injected February 18, 2019 and March 2022   Allergic rhinitis    Anemia    Arthritis     Bipolar disorder 07/05/2010   Cataract    Chronic lower back pain 07/05/2010   Chronic neck pain    Diabetic gastroparesis associated with type 1 diabetes mellitus 10/24/2016   Fibromyalgia    Generalized anxiety disorder 04/27/2014   GERD (gastroesophageal reflux disease) 07/05/2010   Hepatitis B virus infection 07/05/2010   History of substance abuse    opiates; cocaine   Hypoglycemia unawareness associated with type 1 diabetes mellitus 11/23/2020   Insomnia 04/13/2015   Major depressive disorder 07/28/2014   Migraine headache 07/05/2010   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  It hasn't happened in a long time."   Smokers' cough 07/05/2010   Tubular adenoma of colon 2018   Type 1 diabetes mellitus with complication 25/42/7062   WBC decreased 03/27/2017    Tobacco History: Social History   Tobacco Use  Smoking Status Every Day   Packs/day: 1.50   Years: 50.00   Pack years: 75.00   Types: Cigarettes  Smokeless Tobacco Never   Ready to quit: Not Answered Counseling given: Not Answered   Continue to not smoke  Outpatient Encounter Medications as of 08/02/2021  Medication Sig   Accu-Chek Softclix Lancets lancets Use as instructed to check blood sugar 4 times per day dx code E10.65   Alcohol Swabs (B-D SINGLE USE SWABS REGULAR) PADS USE FOUR TIMES DAILY   aspirin EC 81 MG tablet Take 81 mg by mouth every morning.   Blood Glucose Calibration (ACCU-CHEK AVIVA) SOLN USE AS DIRECTED   Blood Glucose Monitoring Suppl (ACCU-CHEK AVIVA PLUS) w/Device KIT USE AS DIRECTED   diclofenac Sodium (VOLTAREN) 1 % GEL APPLY 2 GRAMS EXTERNALLY TO THE AFFECTED AREA FOUR TIMES DAILY   donepezil (ARICEPT) 10 MG tablet  Take 1 tablet every night   DULoxetine (CYMBALTA) 30 MG capsule Take 1 capsule (30 mg total) by mouth daily.   Glucagon (GVOKE HYPOPEN 2-PACK) 1 MG/0.2ML SOAJ INJECT 1 MG UNDER THE SKIN AS NEEDED FOR LOW BLOOD SUGAR   Glucagon (GVOKE HYPOPEN 2-PACK) 1 MG/0.2ML SOAJ Inject 1 mg into the skin as needed for low blood sugar.   glucose blood (ACCU-CHEK AVIVA PLUS) test strip TEST BLOOD SUGAR FOUR TIMES DAILY   hyoscyamine (LEVBID) 0.375 MG 12 hr tablet Take 1 tablet (0.375 mg total) by mouth 2 (two) times daily.   insulin detemir (LEVEMIR FLEXTOUCH) 100 UNIT/ML FlexPen Inject 3 units in the morning and 6 units at bedtime (Patient taking differently: Inject 3 units in the morning and 7 units at bedtime)   insulin lispro (HUMALOG KWIKPEN) 100 UNIT/ML KwikPen inject 1-3 UNITS tid for large meals   Insulin Pen  Needle 32G X 4 MM MISC Use inject insulin tid   lamoTRIgine (LAMICTAL) 100 MG tablet Take 1 tablet (100 mg total) by mouth 2 (two) times daily.   lidocaine (LIDODERM) 5 % UNWRAP AND APPLY 1 PATCH TO SKIN EVERY DAY(REMOVE AND DISCARD PATCH WITHIN 12 HOURS OR AS DIRECTED BY DOCTOR)   lipase/protease/amylase (CREON) 36000 UNITS CPEP capsule Take 2 capsules (72,000 Units total) by mouth 3 (three) times daily before meals.   loratadine (CLARITIN) 10 MG tablet Take 10 mg by mouth as needed for allergies.   lovastatin (MEVACOR) 40 MG tablet Take 1 tablet (40 mg total) by mouth at bedtime.   Magnesium 500 MG TABS Take 1 tablet by mouth as needed.   Multiple Vitamin (MULTIVITAMIN) tablet Take 1 tablet by mouth daily. Reported on 11/22/2015   Omega-3 Fatty Acids (FISH OIL) 1000 MG CAPS Take 1 capsule by mouth daily.   omeprazole (PRILOSEC) 40 MG capsule Take 1 capsule (40 mg total) by mouth in the morning and at bedtime.   traZODone (DESYREL) 100 MG tablet Take 2 tablets (200 mg total) by mouth at bedtime.   umeclidinium-vilanterol (ANORO ELLIPTA) 62.5-25 MCG/INH AEPB Inhale 1 puff into the lungs daily.    vitamin B-12 (CYANOCOBALAMIN) 1000 MCG tablet Take 1 tablet by mouth daily.   vitamin E 1000 UNIT capsule Take 1,000 Units by mouth daily.   No facility-administered encounter medications on file as of 08/02/2021.     Review of Systems  Review of Systems  No chest pain exertion, no orthopnea or PND.  Comprehensive review of systems otherwise negative. Physical Exam  BP 120/62 (BP Location: Left Arm, Patient Position: Sitting, Cuff Size: Normal)    Pulse 82    Temp 98.2 F (36.8 C) (Oral)    Ht 5' 3"  (1.6 m)    Wt 102 lb 9.6 oz (46.5 kg)    SpO2 97%    BMI 18.17 kg/m   Wt Readings from Last 5 Encounters:  08/02/21 102 lb 9.6 oz (46.5 kg)  07/21/21 102 lb (46.3 kg)  07/18/21 104 lb 6 oz (47.3 kg)  06/17/21 101 lb (45.8 kg)  06/09/21 101 lb (45.8 kg)    BMI Readings from Last 5 Encounters:  08/02/21 18.17 kg/m  07/21/21 18.07 kg/m  07/18/21 18.06 kg/m  06/17/21 17.61 kg/m  06/09/21 17.61 kg/m     Physical Exam General: Well-appearing, no acute distress Eyes: EOMI, icterus Neck: Supple, no JVP Pulmonary: Clear, normal work of breathing Cardiovascular: Regular rate and rhythm, no murmur Abdomen: Nondistended, bowel sounds present MSK: No synovitis, no joint effusion Neuro: Normal gait, no weakness Psych: Normal mood, full affect   Assessment & Plan:   Chronic bronchitis: Daily mucus production for about 3 years.  Related to cigarette smoking.  Trial of Anoro to see if helps.  Chest x-ray today.  Lung cancer screening: She qualifies based on age, smoking history.  Discussed the rationale and role of lung cancer screening.  She is interested in an exploring more.  Referral to lung cancer screening clinic.   No follow-ups on file.   Lanier Clam, MD 08/02/2021

## 2021-08-03 ENCOUNTER — Telehealth: Payer: Self-pay | Admitting: Gastroenterology

## 2021-08-03 ENCOUNTER — Other Ambulatory Visit: Payer: Self-pay | Admitting: Internal Medicine

## 2021-08-03 NOTE — Telephone Encounter (Signed)
See phone notes for details.  

## 2021-08-03 NOTE — Telephone Encounter (Signed)
Patient returned your call from yesterday.  She asked that you please call her back on her home phone number.  Thank you.

## 2021-08-04 ENCOUNTER — Telehealth: Payer: Self-pay

## 2021-08-04 NOTE — Progress Notes (Signed)
Chronic Care Management Pharmacy Assistant   Name: Leslie Lane  MRN: [id number] DOB: 1955/08/15   Reason for Encounter: Disease State-General    Recent office visits:  None ID  Recent consult visits:  08/02/21 Lanier Clam, MD-Pulmonary Disease (Chronic bronchitis) No medication changes - Continue Anoro once a day  07/21/21 Rondel Jumbo, PA-C-Neurology (Mild  neurocognitive disorder due to multiple etiologies) No med changes   07/18/21 Ladene Artist, MD-Gastroenterology (Lower abdominal pain) Med changes: Stop taking metoclopramide and dicyclomine  Hospital visits:  None in previous 6 months  Medications: Outpatient Encounter Medications as of 08/04/2021  Medication Sig Note   Accu-Chek Softclix Lancets lancets Use as instructed to check blood sugar 4 times per day dx code E10.65 12/16/2020: use   Alcohol Swabs (B-D SINGLE USE SWABS REGULAR) PADS USE FOUR TIMES DAILY 12/16/2020: use   aspirin EC 81 MG tablet Take 81 mg by mouth every morning.    Blood Glucose Calibration (ACCU-CHEK AVIVA) SOLN USE AS DIRECTED 12/16/2020: use   Blood Glucose Monitoring Suppl (ACCU-CHEK AVIVA PLUS) w/Device KIT USE AS DIRECTED 12/16/2020: use   diclofenac Sodium (VOLTAREN) 1 % GEL APPLY 2 GRAMS EXTERNALLY TO THE AFFECTED AREA FOUR TIMES DAILY    donepezil (ARICEPT) 10 MG tablet Take 1 tablet every night    DULoxetine (CYMBALTA) 30 MG capsule Take 1 capsule (30 mg total) by mouth daily.    Glucagon (GVOKE HYPOPEN 2-PACK) 1 MG/0.2ML SOAJ INJECT 1 MG UNDER THE SKIN AS NEEDED FOR LOW BLOOD SUGAR    Glucagon (GVOKE HYPOPEN 2-PACK) 1 MG/0.2ML SOAJ Inject 1 mg into the skin as needed for low blood sugar. 07/18/2021: On hand   glucose blood (ACCU-CHEK AVIVA PLUS) test strip TEST BLOOD SUGAR FOUR TIMES DAILY    hyoscyamine (LEVBID) 0.375 MG 12 hr tablet Take 1 tablet (0.375 mg total) by mouth 2 (two) times daily.    insulin detemir (LEVEMIR FLEXTOUCH) 100 UNIT/ML FlexPen Inject 3 units in the  morning and 6 units at bedtime (Patient taking differently: Inject 3 units in the morning and 7 units at bedtime)    insulin lispro (HUMALOG KWIKPEN) 100 UNIT/ML KwikPen inject 1-3 UNITS tid for large meals    Insulin Pen Needle 32G X 4 MM MISC Use inject insulin tid    lamoTRIgine (LAMICTAL) 100 MG tablet Take 1 tablet (100 mg total) by mouth 2 (two) times daily.    lidocaine (LIDODERM) 5 % UNWRAP AND APPLY 1 PATCH TO SKIN EVERY DAY(REMOVE AND DISCARD PATCH WITHIN 12 HOURS OR AS DIRECTED BY DOCTOR)    lipase/protease/amylase (CREON) 36000 UNITS CPEP capsule Take 2 capsules (72,000 Units total) by mouth 3 (three) times daily before meals.    loratadine (CLARITIN) 10 MG tablet Take 10 mg by mouth as needed for allergies.    lovastatin (MEVACOR) 40 MG tablet Take 1 tablet (40 mg total) by mouth at bedtime.    Magnesium 500 MG TABS Take 1 tablet by mouth as needed.    Multiple Vitamin (MULTIVITAMIN) tablet Take 1 tablet by mouth daily. Reported on 11/22/2015 09/20/2020: Taking gummy vitamins   Omega-3 Fatty Acids (FISH OIL) 1000 MG CAPS Take 1 capsule by mouth daily.    omeprazole (PRILOSEC) 40 MG capsule Take 1 capsule (40 mg total) by mouth in the morning and at bedtime.    traZODone (DESYREL) 100 MG tablet Take 2 tablets (200 mg total) by mouth at bedtime.    umeclidinium-vilanterol (ANORO ELLIPTA) 62.5-25 MCG/INH AEPB Inhale 1  puff into the lungs daily.    vitamin B-12 (CYANOCOBALAMIN) 1000 MCG tablet Take 1 tablet by mouth daily.    vitamin E 1000 UNIT capsule Take 1,000 Units by mouth daily.    No facility-administered encounter medications on file as of 08/04/2021.   Have you had any problems recently with your health?Patient states that she has been having diarrhea for a few days and is feeling fatigue. She states that she has to take a stool specimen to Dr. Norberto Sorenson today has has an appt with him on 08/25/21 to get results  Have you had any problems with your pharmacy?Patient states she does not  have any problems with getting medications or the cost of medications from the pharmacy  What issues or side effects are you having with your medications?Patient states she is not having any side effects from medications  What would you like me to pass along to [facility] for them to help you with? Patient states other than the diarrhea she is fine   What can we do to take care of you better? Patient states she does not need anything at this time  Care Gaps: Colonoscopy-05/03/17 Diabetic Foot Exam-06/10/21 Mammogram-09/30/20 Ophthalmology-01/07/21 Dexa Scan - NA Annual Well Visit - 06/10/20 Micro albumin-06/07/21 Hemoglobin A1c- 06/10/21   Star Rating Drugs: Lovastatin 40 mg-last fill   04/14/21  [place] Pharmacist Assistant [phone]

## 2021-08-08 ENCOUNTER — Other Ambulatory Visit: Payer: Medicare Other

## 2021-08-08 DIAGNOSIS — R1030 Lower abdominal pain, unspecified: Secondary | ICD-10-CM

## 2021-08-08 DIAGNOSIS — R197 Diarrhea, unspecified: Secondary | ICD-10-CM

## 2021-08-09 DIAGNOSIS — R197 Diarrhea, unspecified: Secondary | ICD-10-CM | POA: Diagnosis not present

## 2021-08-09 DIAGNOSIS — A048 Other specified bacterial intestinal infections: Secondary | ICD-10-CM | POA: Diagnosis not present

## 2021-08-09 DIAGNOSIS — R1030 Lower abdominal pain, unspecified: Secondary | ICD-10-CM | POA: Diagnosis not present

## 2021-08-11 ENCOUNTER — Telehealth (HOSPITAL_BASED_OUTPATIENT_CLINIC_OR_DEPARTMENT_OTHER): Payer: Medicare Other | Admitting: Psychiatry

## 2021-08-11 ENCOUNTER — Encounter (HOSPITAL_COMMUNITY): Payer: Self-pay | Admitting: Psychiatry

## 2021-08-11 ENCOUNTER — Other Ambulatory Visit: Payer: Self-pay

## 2021-08-11 DIAGNOSIS — F332 Major depressive disorder, recurrent severe without psychotic features: Secondary | ICD-10-CM | POA: Diagnosis not present

## 2021-08-11 DIAGNOSIS — F4010 Social phobia, unspecified: Secondary | ICD-10-CM | POA: Diagnosis not present

## 2021-08-11 DIAGNOSIS — G4701 Insomnia due to medical condition: Secondary | ICD-10-CM | POA: Diagnosis not present

## 2021-08-11 LAB — GI PROFILE, STOOL, PCR

## 2021-08-11 MED ORDER — TRAZODONE HCL 100 MG PO TABS: 200.0000 mg | ORAL_TABLET | Freq: Every day | ORAL | 0 refills | Status: DC

## 2021-08-11 MED ORDER — LAMOTRIGINE 100 MG PO TABS: 100.0000 mg | ORAL_TABLET | Freq: Two times a day (BID) | ORAL | 0 refills | Status: DC

## 2021-08-11 MED ORDER — DULOXETINE HCL 30 MG PO CPEP: 30.0000 mg | ORAL_CAPSULE | Freq: Every day | ORAL | 0 refills | Status: DC

## 2021-08-11 NOTE — Progress Notes (Signed)
Virtual Visit via Telephone Note  I connected with Leslie Lane on 08/11/21 at 10:00 AM EST by telephone and verified that I am speaking with the correct person using two identifiers. Leslie Lane was not able to connect to video today.  Location: Patient: home Provider: office   I discussed the limitations, risks, security and privacy concerns of performing an evaluation and management service by telephone and the availability of in person appointments. I also discussed with the patient that there may be a patient responsible charge related to this service. The patient expressed understanding and agreed to proceed.   History of Present Illness: "I guess I am just hanging in there". She is having some diarrhea and is getting some tests done. Her sleep is good but she is tired all the time. Her health is a big source of her depression and anxiety. She is easily bored and doesn't like to sit still for long. Her concentration is poor and she can't even focus on tv shows. She visits a friend daily and doesn't have any hobbies. Leslie Lane feels depressed most days and the average level is 8/10 (10 being the worst). Leslie Lane still struggles with negative self thoughts on a daily basis. She denies passive thoughts of death and SI/HI.    Observations/Objective:  General Appearance: unable to assess  Eye Contact:  unable to assess  Speech:  Clear and Coherent and Normal Rate  Volume:  Normal  Mood:  Anxious and Depressed  Affect:  Congruent  Thought Process:  Coherent, Linear, and Descriptions of Associations: Intact  Orientation:  Full (Time, Place, and Person)  Thought Content:  Logical  Suicidal Thoughts:  No  Homicidal Thoughts:  No  Memory:  Immediate;   Good  Judgement:  Good  Insight:  Good  Psychomotor Activity: unable to assess  Concentration:  Concentration: Good  Recall:  Good  Fund of Knowledge:  Good  Language:  Good  Akathisia:  unable to assess  Handed:  unable to assess  AIMS (if  indicated):     Assets:  Communication Skills Desire for Improvement Financial Resources/Insurance Housing Talents/Skills Transportation Vocational/Educational  ADL's:  unable to assess  Cognition:  WNL  Sleep:        Assessment and Plan: Depression screen [facility] 2/9 08/11/2021 06/02/2021 04/28/2021 12/30/2020 12/16/2020  Decreased Interest 3 3 3 3 3   Down, Depressed, Hopeless 3 3 3 3 3   PHQ - 2 Score 6 6 6 6 6   Altered sleeping 0 0 0 3 3  Tired, decreased energy 3 3 3 3 2   Change in appetite 1 3 3 3 1   Feeling bad or failure about yourself  - 3 3 3 3   Trouble concentrating 3 3 3 3 3   Moving slowly or fidgety/restless 0 2 3 0 1  Suicidal thoughts 0 0 0 3 2  PHQ-9 Score 13 20 21 24 21   Difficult doing work/chores Extremely dIfficult Very difficult Extremely dIfficult Extremely dIfficult -  Some recent data might be hidden    Flowsheet Row Video Visit from 08/11/2021 in [facility] Video Visit from 06/02/2021 in [facility] Video Visit from 04/28/2021 in [facility]  C-SSRS RISK CATEGORY No Risk No Risk No Risk       Her health problems contribute to her depression and anxiety  - start Cymbalta 30mg  po qD for MDD and anxiety. It was recommended by Dr. Dwyane Dee for nerve pain.   1. Major depressive  disorder, recurrent, severe without psychotic features ([place]) - DULoxetine (CYMBALTA) 30 MG capsule; Take 1 capsule (30 mg total) by mouth daily.  Dispense: 90 capsule; Refill: 0 - lamoTRIgine (LAMICTAL) 100 MG tablet; Take 1 tablet (100 mg total) by mouth 2 (two) times daily.  Dispense: 180 tablet; Refill: 0 - traZODone (DESYREL) 100 MG tablet; Take 2 tablets (200 mg total) by mouth at bedtime.  Dispense: 180 tablet; Refill: 0  2. Social anxiety disorder - DULoxetine (CYMBALTA) 30 MG capsule; Take 1 capsule (30 mg total) by mouth daily.  Dispense: 90 capsule; Refill: 0 -  lamoTRIgine (LAMICTAL) 100 MG tablet; Take 1 tablet (100 mg total) by mouth 2 (two) times daily.  Dispense: 180 tablet; Refill: 0  3. Insomnia due to medical condition - traZODone (DESYREL) 100 MG tablet; Take 2 tablets (200 mg total) by mouth at bedtime.  Dispense: 180 tablet; Refill: 0    Follow Up Instructions: In 2-3 months or sooner if needed- visit will be in person   I discussed the assessment and treatment plan with the patient. The patient was provided an opportunity to ask questions and all were answered. The patient agreed with the plan and demonstrated an understanding of the instructions.   The patient was advised to call back or seek an in-person evaluation if the symptoms worsen or if the condition fails to improve as anticipated.  I provided 17 minutes of non-face-to-face time during this encounter.   Charlcie Cradle, MD

## 2021-08-12 LAB — CALPROTECTIN, FECAL: Calprotectin, Fecal: 159 ug/g — ABNORMAL HIGH (ref 0–120)

## 2021-08-19 ENCOUNTER — Other Ambulatory Visit: Payer: Self-pay

## 2021-08-19 LAB — PANCREATIC ELASTASE, FECAL: Pancreatic Elastase-1, Stool: 160 mcg/g — ABNORMAL LOW

## 2021-08-19 MED ORDER — PANCRELIPASE (LIP-PROT-AMYL) 36000-114000 UNITS PO CPEP: ORAL_CAPSULE | ORAL | 11 refills | Status: DC

## 2021-08-25 ENCOUNTER — Encounter: Payer: Self-pay | Admitting: Gastroenterology

## 2021-08-25 ENCOUNTER — Ambulatory Visit: Payer: Medicare Other | Admitting: Gastroenterology

## 2021-08-25 VITALS — BP 104/58 | HR 82 | Ht 63.0 in | Wt 104.2 lb

## 2021-08-25 DIAGNOSIS — R197 Diarrhea, unspecified: Secondary | ICD-10-CM | POA: Diagnosis not present

## 2021-08-25 DIAGNOSIS — R1030 Lower abdominal pain, unspecified: Secondary | ICD-10-CM | POA: Diagnosis not present

## 2021-08-25 NOTE — Patient Instructions (Signed)
Remain on Creon and Levbid.  Call our office if your symptoms get worse before you follow up in 2 months.   The [facility] providers would like to encourage you to use [facility] to communicate with providers for non-urgent requests or questions.  Due to long hold times on the telephone, sending your provider a message by [facility] may be a faster and more efficient way to get a response.  Please allow 48 business hours for a response.  Please remember that this is for non-urgent requests.   Thank you for choosing me and [facility].  Pricilla Riffle. Dagoberto Ligas., MD., [facility] Reg[facility]

## 2021-08-25 NOTE — Progress Notes (Signed)
° ° °  History of Present Illness: This is a 66 year old female with lower abdominal pain, diarrhea. Her fecal elastase low at 160 and fecal calprotectin was elevated at 159. Marland Kitchen CT AP 08/02/2021 was negative except for aortic atherosclerosis.  She was started on Creon and her diarrhea has improved.  Her lower abdominal pain has improved.  Her weight remains stable at 104.  Current Medications, Allergies, Past Medical History, Past Surgical History, Family History and Social History were reviewed in [employer] record.   Physical Exam: General: Well developed, well nourished, no acute distress Head: Normocephalic and atraumatic Eyes: Sclerae anicteric, EOMI Ears: Normal auditory acuity Mouth: Not examined, mask on during Covid-19 pandemic Lungs: Clear throughout to auscultation Heart: Regular rate and rhythm; no murmurs, rubs or bruits Abdomen: Soft, non tender and non distended. No masses, hepatosplenomegaly or hernias noted. Normal Bowel sounds Rectal: Not done Musculoskeletal: Symmetrical with no gross deformities  Pulses:  Normal pulses noted Extremities: No clubbing, cyanosis, edema or deformities noted Neurological: Alert oriented x 4, grossly nonfocal Psychological:  Alert and cooperative. Normal mood and affect   Assessment and Recommendations:  Chronic lower abdominal pain and diarrhea due to pancreatic insufficieny.  Continue Creon 72,000 units with meals and 36,000 units with snacks.  Continue Levbid 0.375 mg twice daily.  Avoid foods and beverages that exacerbate symptoms. REV in 2 months.  GERD.  Follow antireflux measures.  Omeprazole 40 mg po twice daily.

## 2021-08-30 DIAGNOSIS — E161 Other hypoglycemia: Secondary | ICD-10-CM | POA: Diagnosis not present

## 2021-08-30 DIAGNOSIS — R4020 Unspecified coma: Secondary | ICD-10-CM | POA: Diagnosis not present

## 2021-08-30 DIAGNOSIS — I10 Essential (primary) hypertension: Secondary | ICD-10-CM | POA: Diagnosis not present

## 2021-08-30 DIAGNOSIS — E162 Hypoglycemia, unspecified: Secondary | ICD-10-CM | POA: Diagnosis not present

## 2021-08-31 ENCOUNTER — Other Ambulatory Visit: Payer: Self-pay | Admitting: Endocrinology

## 2021-09-01 ENCOUNTER — Other Ambulatory Visit (HOSPITAL_COMMUNITY): Payer: Self-pay

## 2021-09-01 MED ORDER — GVOKE HYPOPEN 2-PACK 1 MG/0.2ML ~~LOC~~ SOAJ
1.0000 mg | SUBCUTANEOUS | 1 refills | Status: DC | PRN
  Filled 2021-09-01: qty 0.4, 7d supply, fill #0
  Filled 2021-10-03: qty 0.4, 7d supply, fill #1

## 2021-09-01 NOTE — Progress Notes (Signed)
Subjective:    Patient ID: ZI SEK, female    DOB: 05-03-1956, 66 y.o.   MRN: [id number]  This visit occurred during the SARS-CoV-2 public health emergency.  Safety protocols were in place, including screening questions prior to the visit, additional usage of staff PPE, and extensive cleaning of exam room while observing appropriate contact time as indicated for disinfecting solutions.    HPI Leslie Lane is here for  Chief Complaint  Patient presents with   Abdominal Pain    Stomach pain with diarrhea    She feels her stomach started hurting when she got fibromyalgia.  She believes the fibromyalgia is the cause of her abdominal pain.  It is chronic.  It only hurts across the lower abdomen. The pain is constant. Nothing makes it better or worse including changes in position, going to the bathroom (urinating, having a BM).  She has chronic diarrhea and was just placed on creon for pancreatic insufficiency.  She last saw GI 08/25/2021.  It has not been long enough to see the effects of the Creon.  She had a CT of the abdomen and pelvis last month and there is no abnormalities to explain the abdominal pain.  She does have muscle pain throughout her body back, legs, arms.  What bothers her most is the abdominal pain.  She has been on gabapentin in the past, but stopped it because it made her too drowsy.  She is not currently taking the duloxetine-that makes her too drowsy as well.  She does feel that she has a vaginal yeast infection.  He is a diabetic and she states she has had these frequently.  She has a yellow vaginal discharge and a lot of vulvar itching.  She is wondering if she could have the pill that has been helping her with these infections.      Medications and allergies reviewed with patient and updated if appropriate.  Current Outpatient Medications on File Prior to Visit  Medication Sig Dispense Refill   Accu-Chek Softclix Lancets lancets Use as instructed to check blood  sugar 4 times per day dx code E10.65 400 each 1   Alcohol Swabs (B-D SINGLE USE SWABS REGULAR) PADS USE FOUR TIMES DAILY 400 each 2   aspirin EC 81 MG tablet Take 81 mg by mouth every morning.     Blood Glucose Calibration (ACCU-CHEK AVIVA) SOLN USE AS DIRECTED 1 each 1   Blood Glucose Monitoring Suppl (ACCU-CHEK AVIVA PLUS) w/Device KIT USE AS DIRECTED 1 kit 0   diclofenac Sodium (VOLTAREN) 1 % GEL APPLY 2 GRAMS EXTERNALLY TO THE AFFECTED AREA FOUR TIMES DAILY 700 g 2   donepezil (ARICEPT) 10 MG tablet Take 1 tablet every night 90 tablet 3   DULoxetine (CYMBALTA) 30 MG capsule Take 1 capsule (30 mg total) by mouth daily. 90 capsule 0   Glucagon (GVOKE HYPOPEN 2-PACK) 1 MG/0.2ML SOAJ INJECT 1 MG UNDER THE SKIN AS NEEDED FOR LOW BLOOD SUGAR 0.4 mL 1   Glucagon (GVOKE HYPOPEN 2-PACK) 1 MG/0.2ML SOAJ Inject 1 mg into the skin as needed for low blood sugar. 0.4 mL 1   glucose blood (ACCU-CHEK AVIVA PLUS) test strip TEST BLOOD SUGAR FOUR TIMES DAILY 400 strip 2   hyoscyamine (LEVBID) 0.375 MG 12 hr tablet Take 1 tablet (0.375 mg total) by mouth 2 (two) times daily. 60 tablet 11   insulin detemir (LEVEMIR FLEXTOUCH) 100 UNIT/ML FlexPen Inject 3 units in the morning and 6 units at bedtime (Patient  taking differently: Inject 3 units in the morning and 7 units at bedtime) 15 mL 5   insulin lispro (HUMALOG KWIKPEN) 100 UNIT/ML KwikPen inject 1-3 UNITS tid for large meals 15 mL 1   Insulin Pen Needle 32G X 4 MM MISC Use inject insulin tid 300 each 2   lamoTRIgine (LAMICTAL) 100 MG tablet Take 1 tablet (100 mg total) by mouth 2 (two) times daily. 180 tablet 0   lipase/protease/amylase (CREON) 36000 UNITS CPEP capsule Take 2 capsules (72,000 Units total) by mouth with breakfast, with lunch, and with evening meal. May also take 2 capsules (72,000 Units total) as needed (with snacks). 300 capsule 11   loratadine (CLARITIN) 10 MG tablet Take 10 mg by mouth as needed for allergies.     lovastatin (MEVACOR) 40 MG  tablet TAKE 1 TABLET(40 MG) BY MOUTH AT BEDTIME 90 tablet 2   Magnesium 500 MG TABS Take 1 tablet by mouth as needed.     Multiple Vitamin (MULTIVITAMIN) tablet Take 1 tablet by mouth daily. Reported on 11/22/2015     Omega-3 Fatty Acids (FISH OIL) 1000 MG CAPS Take 1 capsule by mouth daily.     omeprazole (PRILOSEC) 40 MG capsule Take 1 capsule (40 mg total) by mouth in the morning and at bedtime. 180 capsule 0   traZODone (DESYREL) 100 MG tablet Take 2 tablets (200 mg total) by mouth at bedtime. 180 tablet 0   umeclidinium-vilanterol (ANORO ELLIPTA) 62.5-25 MCG/INH AEPB Inhale 1 puff into the lungs daily. 60 each 11   vitamin B-12 (CYANOCOBALAMIN) 1000 MCG tablet Take 1 tablet by mouth daily.     vitamin E 1000 UNIT capsule Take 1,000 Units by mouth daily.     No current facility-administered medications on file prior to visit.    Review of Systems  Constitutional:  Negative for chills and fever.  Respiratory:  Negative for shortness of breath.   Cardiovascular:  Negative for chest pain and palpitations.  Gastrointestinal:  Positive for abdominal pain and diarrhea (chronic). Negative for abdominal distention, blood in stool, constipation and nausea.       No gerd  Genitourinary:  Positive for vaginal discharge. Negative for dysuria, frequency, hematuria and vaginal bleeding.       Vulvar itch  Musculoskeletal:        Arms hurt, muscle ache  Neurological:  Positive for headaches (chronic). Negative for light-headedness.       Neuropathy in feet - burn      Objective:   Vitals:   09/02/21 1345  BP: 140/86  Pulse: 76  Temp: 98.5 F (36.9 C)  SpO2: 99%   BP Readings from Last 3 Encounters:  09/02/21 140/86  08/25/21 (!) 104/58  08/02/21 120/62   Wt Readings from Last 3 Encounters:  09/02/21 101 lb 9.6 oz (46.1 kg)  08/25/21 104 lb 4 oz (47.3 kg)  08/02/21 102 lb 9.6 oz (46.5 kg)   Body mass index is 18 kg/m.    Physical Exam Constitutional:      General: She is not  in acute distress.    Appearance: She is well-developed. She is not ill-appearing.  HENT:     Head: Normocephalic and atraumatic.  Abdominal:     General: Abdomen is flat.     Palpations: Abdomen is soft.     Tenderness: There is abdominal tenderness in the right lower quadrant, suprapubic area and left lower quadrant. There is no guarding or rebound.  Genitourinary:    Comments: deferred Musculoskeletal:  Comments: Muscle tenderness in back, arms, and legs  Skin:    General: Skin is warm and dry.  Neurological:     Mental Status: She is alert.         CT Abdomen Pelvis W Contrast CLINICAL DATA:  Chronic lower abdominal pain  EXAM: CT ABDOMEN AND PELVIS WITH CONTRAST  TECHNIQUE: Multidetector CT imaging of the abdomen and pelvis was performed using the standard protocol following bolus administration of intravenous contrast.  RADIATION DOSE REDUCTION: This exam was performed according to the departmental dose-optimization program which includes automated exposure control, adjustment of the mA and/or kV according to patient size and/or use of iterative reconstruction technique.  CONTRAST:  65m OMNIPAQUE IOHEXOL 300 MG/ML  SOLN  COMPARISON:  CT abdomen and pelvis dated April 09, 2017  FINDINGS: Lower chest: No acute abnormality.  Hepatobiliary: No focal liver abnormality is seen. No gallstones, gallbladder wall thickening, or biliary dilatation.  Pancreas: Unremarkable. No pancreatic ductal dilatation or surrounding inflammatory changes.  Spleen: Normal in size without focal abnormality.  Adrenals/Urinary Tract: Adrenal glands are unremarkable. Kidneys are normal, without renal calculi, focal lesion, or hydronephrosis. Bladder is unremarkable.  Stomach/Bowel: Stomach is within normal limits. Appendix is not visualized. No evidence of bowel wall thickening, distention, or inflammatory changes.  Vascular/Lymphatic: Aortic atherosclerosis. No enlarged  abdominal or pelvic lymph nodes.  Reproductive: Uterus and bilateral adnexa are unremarkable.  Other: No abdominal wall hernia or abnormality. No abdominopelvic ascites.  Musculoskeletal: No acute or significant osseous findings.  IMPRESSION: 1. No findings in the abdomen or pelvis to explain abdominal pain. 2.  Aortic Atherosclerosis (ICD10-I70.0).  Electronically Signed   By: LYetta GlassmanM.D.   On: 08/02/2021 13:44    Assessment & Plan:     Vaginal yeast infection: Acute Having yellow discharge and vulvar itching States she has had more frequent yeast infections because of her diabetes Diflucan 150 mg p.o. x1   Fibromyalgia, lower abdominal pain: Chronic She was diagnosed with fibromyalgia sometime in her 555sand that is when her lower abdominal pain started-she believes it is related to her fibromyalgia CT of the abdomen and pelvis did not show anything that would cause the abdominal pain and she has no concerning urinary symptoms Following with GI for pancreatic insufficiency causing diarrhea-just started on Creon ?  Related fibromyalgia ?  Related to chronic back pain Her muscles are tender on exam Will refer to Dr. RRanell Patrickcall her for further evaluation and treatment since she has failed a few medications because of drowsiness

## 2021-09-02 ENCOUNTER — Telehealth: Payer: Self-pay | Admitting: Gastroenterology

## 2021-09-02 ENCOUNTER — Other Ambulatory Visit: Payer: Self-pay

## 2021-09-02 ENCOUNTER — Ambulatory Visit (INDEPENDENT_AMBULATORY_CARE_PROVIDER_SITE_OTHER): Payer: Medicare Other | Admitting: Internal Medicine

## 2021-09-02 ENCOUNTER — Encounter: Payer: Self-pay | Admitting: Internal Medicine

## 2021-09-02 VITALS — BP 140/86 | HR 76 | Temp 98.5°F | Ht 63.0 in | Wt 101.6 lb

## 2021-09-02 DIAGNOSIS — M797 Fibromyalgia: Secondary | ICD-10-CM

## 2021-09-02 DIAGNOSIS — B3731 Acute candidiasis of vulva and vagina: Secondary | ICD-10-CM

## 2021-09-02 DIAGNOSIS — R1030 Lower abdominal pain, unspecified: Secondary | ICD-10-CM | POA: Diagnosis not present

## 2021-09-02 MED ORDER — PANCRELIPASE (LIP-PROT-AMYL) 36000-114000 UNITS PO CPEP: ORAL_CAPSULE | ORAL | 11 refills | Status: AC

## 2021-09-02 MED ORDER — FLUCONAZOLE 150 MG PO TABS: 150.0000 mg | ORAL_TABLET | Freq: Once | ORAL | 0 refills | Status: AC

## 2021-09-02 NOTE — Patient Instructions (Signed)
? ? ? ?  Medications changes include :   fluconazole for the yeast infection. ? ? ?Your prescription(s) have been sent to your pharmacy.  ? ? ?A referral was ordered for Dr Ranell Patrick.     Someone from that office will call you to schedule an appointment.  ? ? ?

## 2021-09-02 NOTE — Telephone Encounter (Signed)
Inbound call from patient mother. States patient have been following the instructions for creon but would like to know what is the daily limit the patient should take ?

## 2021-09-02 NOTE — Telephone Encounter (Signed)
Informed patient's mother per Dr. Lynne Leader office note was to remain on Creon taking 72,000 units (2 capsules) with meals (three times a day) and 36,000 units (1 capsule) with snacks (twice daily). Patient's mother verbalized understanding. ?

## 2021-09-06 ENCOUNTER — Other Ambulatory Visit: Payer: Self-pay

## 2021-09-06 ENCOUNTER — Other Ambulatory Visit (INDEPENDENT_AMBULATORY_CARE_PROVIDER_SITE_OTHER): Payer: Medicare Other

## 2021-09-06 DIAGNOSIS — E108 Type 1 diabetes mellitus with unspecified complications: Secondary | ICD-10-CM

## 2021-09-06 LAB — BASIC METABOLIC PANEL
BUN: 26 mg/dL — ABNORMAL HIGH (ref 6–23)
CO2: 33 mEq/L — ABNORMAL HIGH (ref 19–32)
Calcium: 10.2 mg/dL (ref 8.4–10.5)
Chloride: 99 mEq/L (ref 96–112)
Creatinine, Ser: 0.77 mg/dL (ref 0.40–1.20)
GFR: 79.84 mL/min (ref >60.00)
Glucose, Bld: 168 mg/dL — ABNORMAL HIGH (ref 70–99)
Potassium: 5 mEq/L (ref 3.5–5.1)
Sodium: 138 mEq/L (ref 135–145)

## 2021-09-06 LAB — HEMOGLOBIN A1C: Hgb A1c MFr Bld: 6.5 % (ref 4.6–6.5)

## 2021-09-08 ENCOUNTER — Encounter: Payer: Self-pay | Admitting: Endocrinology

## 2021-09-08 ENCOUNTER — Other Ambulatory Visit: Payer: Self-pay

## 2021-09-08 ENCOUNTER — Ambulatory Visit: Payer: Medicare Other | Admitting: Endocrinology

## 2021-09-08 VITALS — Ht 63.5 in | Wt 102.8 lb

## 2021-09-08 DIAGNOSIS — E1042 Type 1 diabetes mellitus with diabetic polyneuropathy: Secondary | ICD-10-CM

## 2021-09-08 DIAGNOSIS — E1065 Type 1 diabetes mellitus with hyperglycemia: Secondary | ICD-10-CM | POA: Diagnosis not present

## 2021-09-08 MED ORDER — FREESTYLE LIBRE 2 SENSOR MISC: 12 refills | Status: DC

## 2021-09-08 NOTE — Progress Notes (Signed)
Patient ID: Leslie Lane, female   DOB: 1956/03/05, 66 y.o.   MRN: [id number]  Reason for Appointment: Endocrinology follow-up   History of Present Illness    Diagnosis: Type 1 DIABETES MELITUS, diagnosed 1967      She has had labile blood sugar control over the last several years even though A1c has been usually around 7% She has had less lability and hypoglycemia with taking b.i.d. Lantus compared once a day She has been very sensitive to fast acting insulin and frequently does not require mealtime coverage She cannot tolerate Toujeo because of reported episode of headache, bloating and nausea with the first dose  RECENT history:  Insulin regimen: LEVEMIR 3 units in the morning and 7 at 11 pm HUMALOG 1-2 units up to 3 times a day  Current blood sugar patterns, control and problems identified:  Her A1c is about the same at 6.5  She has large been using the freestyle libre for the last 2 months at least as she did not have refills on her mail order prescription and she did not call about this  Difficult to analyze her blood sugars as meter could not be downloaded  However she has had HYPOGLYCEMIA sporadically at different times with some readings in the 30s  However appears to have had only 1 low sugar waking up about 4 days ago and documented overnight At times will have hypoglycemia unawareness and may be recognized by her family member  She says she is taking at least 1 or 2 units of Humalog with every meal based on her Premeal blood sugar  At times she is more active with various household activities and cleaning as well as walking while shopping  She may tend to have more low sugars in the afternoons  As before she does not remember to take a snack when she is planning to be more active She is usually trying to cut back on large portions, however she tends to overtreat low blood sugars with excessive food intake This week her mother has been hospitalized for hip  fracture and she is concerned about being alone   Mealtimes: Breakfast 8-8.30 am, lunch 12 noon , dinner 5 pm  Hypoglycemia: Symptoms may be absent with low blood sugars and does not have early warning symptoms. Gets confused and she frequently depends on her mother to recognize low sugars and treat them.  Her mother knows how to give Glucagon injection  She will treat her low blood sugars usually with juice, does carry glucose tablets when not at home  Blood sugars from recent Accu-Chek meter:  PRE-MEAL Fasting Lunch Dinner Bedtime Overall  Glucose range: 53-274 36-188 36-202 30-130   Mean/median:     ?    Previous freestyle libre data:  CGM use % of time   2-week average/GV 165  Time in range    58    %  % Time Above 180 27+2  % Time above 250   % Time Below 70 3     PRE-MEAL Fasting Lunch Dinner Bedtime Overall  Glucose range:       Averages: 134 155 139 229    POST-MEAL PC Breakfast PC Lunch PC Dinner  Glucose range:     Averages: 188 137 152    Wt Readings from Last 3 Encounters:  09/08/21 102 lb 12.8 oz (46.6 kg)  09/02/21 101 lb 9.6 oz (46.1 kg)  08/25/21 104 lb 4 oz (47.3 kg)    Lab Results  Component Value Date   HGBA1C 6.5 09/06/2021   HGBA1C 6.6 (A) 06/09/2021   HGBA1C 6.7 (H) 06/07/2021   Lab Results  Component Value Date   MICROALBUR 2.4 (H) 06/07/2021   LDLCALC 81 06/07/2021   CREATININE 0.77 09/06/2021        Allergies as of 09/08/2021       Reactions   Penicillins Anaphylaxis   Sulfonamide Derivatives Anaphylaxis        Medication List        Accurate as of September 08, 2021  8:48 PM. If you have any questions, ask your nurse or doctor.          Accu-Chek Aviva Plus test strip Generic drug: glucose blood TEST BLOOD SUGAR FOUR TIMES DAILY   Accu-Chek Aviva Plus w/Device Kit USE AS DIRECTED   Accu-Chek Aviva Soln USE AS DIRECTED   Accu-Chek Softclix Lancets lancets Use as instructed to check blood sugar 4 times per day  dx code E10.65   Anoro Ellipta 62.5-25 MCG/ACT Aepb Generic drug: umeclidinium-vilanterol Inhale 1 puff into the lungs daily.   aspirin EC 81 MG tablet Take 81 mg by mouth every morning.   B-D SINGLE USE SWABS REGULAR Pads USE FOUR TIMES DAILY   diclofenac Sodium 1 % Gel Commonly known as: VOLTAREN APPLY 2 GRAMS EXTERNALLY TO THE AFFECTED AREA FOUR TIMES DAILY   donepezil 10 MG tablet Commonly known as: ARICEPT Take 1 tablet every night   DULoxetine 30 MG capsule Commonly known as: Cymbalta Take 1 capsule (30 mg total) by mouth daily.   Fish Oil 1000 MG Caps Take 1 capsule by mouth daily.   FreeStyle Libre 2 Sensor Misc Use to check blood sugar daily Started by: [name], MD   Gvoke HypoPen 2-Pack 1 MG/0.2ML Soaj Generic drug: Glucagon INJECT 1 MG UNDER THE SKIN AS NEEDED FOR LOW BLOOD SUGAR   Gvoke HypoPen 2-Pack 1 MG/0.2ML Soaj Generic drug: Glucagon Inject 1 mg into the skin as needed for low blood sugar.   hyoscyamine 0.375 MG 12 hr tablet Commonly known as: Levbid Take 1 tablet (0.375 mg total) by mouth 2 (two) times daily.   insulin lispro 100 UNIT/ML KwikPen Commonly known as: HumaLOG KwikPen inject 1-3 UNITS tid for large meals   Insulin Pen Needle 32G X 4 MM Misc Use inject insulin tid   lamoTRIgine 100 MG tablet Commonly known as: LAMICTAL Take 1 tablet (100 mg total) by mouth 2 (two) times daily.   Levemir FlexTouch 100 UNIT/ML FlexPen Generic drug: insulin detemir Inject 3 units in the morning and 6 units at bedtime What changed: additional instructions   lipase/protease/amylase 36000 UNITS Cpep capsule Commonly known as: Creon Take 2 capsules (72,000 Units total) by mouth with breakfast, with lunch, and with evening meal. May also take 1 capsule (36,000 Units total) as needed (with snacks).   loratadine 10 MG tablet Commonly known as: CLARITIN Take 10 mg by mouth as needed for allergies.   lovastatin 40 MG tablet Commonly known as:  MEVACOR TAKE 1 TABLET(40 MG) BY MOUTH AT BEDTIME   Magnesium 500 MG Tabs Take 1 tablet by mouth as needed.   multivitamin tablet Take 1 tablet by mouth daily. Reported on 11/22/2015   omeprazole 40 MG capsule Commonly known as: PRILOSEC Take 1 capsule (40 mg total) by mouth in the morning and at bedtime.   traZODone 100 MG tablet Commonly known as: DESYREL Take 2 tablets (200 mg total) by mouth at bedtime.   vitamin  B-12 1000 MCG tablet Commonly known as: CYANOCOBALAMIN Take 1 tablet by mouth daily.   vitamin E 1000 UNIT capsule Take 1,000 Units by mouth daily.        Allergies:  Allergies  Allergen Reactions   Penicillins Anaphylaxis   Sulfonamide Derivatives Anaphylaxis    Past Medical History:  Diagnosis Date   Abdominal pain 02/28/2016   Abnormal CT scan, bladder    Noted 07/28/13 CT - s/p uro eval 08/2013 [place] -    Acquired trigger finger of left ring finger 02/18/2019   Injected February 18, 2019 and March 2022   Allergic rhinitis    Anemia    Arthritis    Bipolar disorder 07/05/2010   Cataract    Chronic lower back pain 07/05/2010   Chronic neck pain    Diabetic gastroparesis associated with type 1 diabetes mellitus 10/24/2016   Fibromyalgia    Generalized anxiety disorder 04/27/2014   GERD (gastroesophageal reflux disease) 07/05/2010   Hepatitis B virus infection 07/05/2010   History of substance abuse    opiates; cocaine   Hypoglycemia unawareness associated with type 1 diabetes mellitus 11/23/2020   Insomnia 04/13/2015   Major depressive disorder 07/28/2014   Migraine headache 07/05/2010   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  It hasn't happened in a long time."    Smokers' cough 07/05/2010   Tubular adenoma of colon 2018   Type 1 diabetes mellitus with complication 67/67/2094   WBC decreased 03/27/2017    Past Surgical History:  Procedure Laterality Date   CESAREAN SECTION     COLONOSCOPY     TUBAL LIGATION     UPPER GASTROINTESTINAL ENDOSCOPY      Family History  Problem Relation Age of Onset   Arthritis Mother    Diverticulosis Mother    Kidney disease Mother    Hyperlipidemia Mother    Arthritis Father    Kidney disease Father    Kidney cancer Father    Bladder Cancer Sister    Colon cancer Sister 42   Celiac disease Sister    Heart attack Brother    Kidney cancer Paternal Aunt    Diabetes Niece    Hyperlipidemia Other    Anxiety disorder Neg Hx    Bipolar disorder Neg Hx    Depression Neg Hx    Breast cancer Neg Hx    Esophageal cancer Neg Hx    Stomach cancer Neg Hx    Rectal cancer Neg Hx     Social History:  reports that she has been smoking cigarettes. She has a 75.00 pack-year smoking history. She has never used smokeless tobacco. She reports current alcohol use of about 7.0 standard drinks per week. She reports that she does not currently use drugs after having used the following drugs: Cocaine.  Review of Systems:   Had gastroparesis treated with Reglan but not clear if she is taking this now Also now being followed by gastroenterologist for abdominal pain and pancreatic insufficiency She is still having some tendency to diarrhea despite taking Creon    NEUROPATHY: She has history of burning in her legs and feet and she thinks that gabapentin did not work and caused her to be sleepy She has been  prescribed Cymbalta   DEPRESSION: She has had long-standing depression and anxiety on long-term treatment  HYPERLIPIDEMIA: The lipid abnormality consists of elevated LDL   Is  on lovastatin 40 mg with  good control   Lab Results  Component Value Date   CHOL 209 (H) 06/07/2021   HDL 115.00 06/07/2021   LDLCALC 81  06/07/2021   LDLDIRECT 101.7 03/26/2012   TRIG 64.0 06/07/2021   CHOLHDL 2 06/07/2021    Memory difficulties: She was given Aricept by the neurologist, is taking 10 mg    Examination:   Ht 5' 3.5" (1.613 m)    Wt 102 lb 12.8 oz (46.6 kg)    BMI 17.92 kg/m   Body mass index is 17.92 kg/m.    ASSESSMENT/ PLAN:   Diabetes type 1 longstanding  See history of present illness for detailed discussion of current insulin, blood sugar patterns, problems identified  A1c has been under 7% and now 6.6  Again her A1c is relatively good However she has some labile blood sugars patterns and pediatric tendency to severe hypoglycemia  Likely has more hypoglycemia with not using the freestyle libre sensor although only getting low blood sugars every 2 to 5 days  However still has hypoglycemia unawareness and is not taking enough precautions to prevent low sugars  Recently fasting readings are fairly good with some variability but also has had bowel low blood sugar recently  Recommendations:  She can try to skip her Humalog dose at lunchtime unless blood sugars are significantly high to avoid low sugars in the afternoon She will call the mail order company to send [place] a fax for getting her freestyle libre refilled Also will send a prescription directly Reminded her to make sure she has a snack if she is planning to be very active Also if her freestyle libre indicates her blood sugars are below 100 and going down she needs to have a snack at that time For now reduce her Levemir to 6 units instead of 7 She will call if she has continued tendency to excessive hypoglycemia Advised her to avoid overtreating low blood sugars and use mostly simple sugars Follow-up in 3 months  Continue Cymbalta for neuropathy, this likely is helping her symptoms  Total visit time including counseling = 30 minutes  Patient Instructions  Reduce pm dose Levemir to 6 units, go to 7 units if am sugar stays over  150  Extra snack when more active  Stop Humalog at lunch      [name] 09/08/2021, 8:48 PM

## 2021-09-08 NOTE — Patient Instructions (Addendum)
Reduce pm dose Levemir to 6 units, go to 7 units if am sugar stays over 150 ? ?Extra snack when more active ? ?Stop Humalog at lunch ?

## 2021-09-13 DIAGNOSIS — L02821 Furuncle of head [any part, except face]: Secondary | ICD-10-CM | POA: Diagnosis not present

## 2021-09-13 DIAGNOSIS — L538 Other specified erythematous conditions: Secondary | ICD-10-CM | POA: Diagnosis not present

## 2021-09-13 DIAGNOSIS — L281 Prurigo nodularis: Secondary | ICD-10-CM | POA: Diagnosis not present

## 2021-09-14 DIAGNOSIS — E1065 Type 1 diabetes mellitus with hyperglycemia: Secondary | ICD-10-CM | POA: Diagnosis not present

## 2021-09-28 ENCOUNTER — Other Ambulatory Visit: Payer: Self-pay | Admitting: Internal Medicine

## 2021-09-28 DIAGNOSIS — Z1231 Encounter for screening mammogram for malignant neoplasm of breast: Secondary | ICD-10-CM

## 2021-09-29 ENCOUNTER — Other Ambulatory Visit: Payer: Self-pay | Admitting: Endocrinology

## 2021-09-29 DIAGNOSIS — E1065 Type 1 diabetes mellitus with hyperglycemia: Secondary | ICD-10-CM

## 2021-10-03 ENCOUNTER — Telehealth: Payer: Self-pay

## 2021-10-03 ENCOUNTER — Other Ambulatory Visit (HOSPITAL_COMMUNITY): Payer: Self-pay

## 2021-10-03 ENCOUNTER — Ambulatory Visit
Admission: RE | Admit: 2021-10-03 | Discharge: 2021-10-03 | Disposition: A | Discharge status: Home / Self Care | Payer: Medicare Other | Source: Ambulatory Visit | Attending: Internal Medicine | Admitting: Internal Medicine

## 2021-10-03 DIAGNOSIS — Z1231 Encounter for screening mammogram for malignant neoplasm of breast: Secondary | ICD-10-CM

## 2021-10-03 NOTE — Progress Notes (Signed)
? ? ?Chronic Care Management ?Pharmacy Assistant  ? ?Name: Leslie Lane  MRN: [id number] DOB: [birth year] ? ?Leslie Lane is an 66 y.o. year old female who was called today for her follow-up assessment call. ?Patient has appt with clinical pharmacist 11/21/21 at 11 am ? ?Reason for Encounter: Disease State-General  ?  ? ?Recent office visits:  ?09/02/21 Binnie Rail, MD-PCP (Abdominal pain) order:Ambulatory referral to Physical Medicine Rehab, Med change: fluconazole (DIFLUCAN) 150 MG tablet ? ?Recent consult visits:  ?09/08/21 [name], MD-Endocrinology (uncontrolled diabetes mellitus) Orders:Glucose,random and Hg a1c, no med change ? ?08/25/21 Lucio Edward T, MD-Gastroenterology (abdominal pain and diarrhea) No orders or med changes ? ?08/11/21 Charlcie Cradle, MD-Psychiatry (Depression)No orders or med changes ? ?Hospital visits:  ?None in previous 6 months ? ?Medications: ?Outpatient Encounter Medications as of 10/03/2021  ?Medication Sig Note  ? Accu-Chek Softclix Lancets lancets Use as instructed to check blood sugar 4 times per day dx code E10.65 12/16/2020: use  ? Alcohol Swabs (B-D SINGLE USE SWABS REGULAR) PADS USE FOUR TIMES DAILY 12/16/2020: use  ? aspirin EC 81 MG tablet Take 81 mg by mouth every morning.   ? Blood Glucose Calibration (ACCU-CHEK AVIVA) SOLN USE AS DIRECTED 12/16/2020: use  ? Blood Glucose Monitoring Suppl (ACCU-CHEK AVIVA PLUS) w/Device KIT USE AS DIRECTED 12/16/2020: use  ? Continuous Blood Gluc Sensor (FREESTYLE LIBRE 2 SENSOR) MISC Use to check blood sugar daily   ? diclofenac Sodium (VOLTAREN) 1 % GEL APPLY 2 GRAMS EXTERNALLY TO THE AFFECTED AREA FOUR TIMES DAILY   ? donepezil (ARICEPT) 10 MG tablet Take 1 tablet every night   ? DULoxetine (CYMBALTA) 30 MG capsule Take 1 capsule (30 mg total) by mouth daily. (Patient not taking: Reported on 09/08/2021)   ? Glucagon (GVOKE HYPOPEN 2-PACK) 1 MG/0.2ML SOAJ INJECT 1 MG UNDER THE SKIN AS NEEDED FOR LOW BLOOD SUGAR   ? Glucagon (GVOKE HYPOPEN  2-PACK) 1 MG/0.2ML SOAJ Inject 1 mg into the skin as needed for low blood sugar.   ? glucose blood (ACCU-CHEK AVIVA PLUS) test strip TEST BLOOD SUGAR 4 TIMES  DAILY   ? hyoscyamine (LEVBID) 0.375 MG 12 hr tablet Take 1 tablet (0.375 mg total) by mouth 2 (two) times daily.   ? insulin detemir (LEVEMIR FLEXTOUCH) 100 UNIT/ML FlexPen Inject 3 units in the morning and 6 units at bedtime (Patient taking differently: Inject 3 units in the morning and 7 units at bedtime)   ? insulin lispro (HUMALOG KWIKPEN) 100 UNIT/ML KwikPen inject 1-3 UNITS tid for large meals   ? Insulin Pen Needle 32G X 4 MM MISC Use inject insulin tid   ? lamoTRIgine (LAMICTAL) 100 MG tablet Take 1 tablet (100 mg total) by mouth 2 (two) times daily.   ? loratadine (CLARITIN) 10 MG tablet Take 10 mg by mouth as needed for allergies.   ? lovastatin (MEVACOR) 40 MG tablet TAKE 1 TABLET(40 MG) BY MOUTH AT BEDTIME   ? Magnesium 500 MG TABS Take 1 tablet by mouth as needed.   ? Multiple Vitamin (MULTIVITAMIN) tablet Take 1 tablet by mouth daily. Reported on 11/22/2015 09/20/2020: Taking gummy vitamins  ? Omega-3 Fatty Acids (FISH OIL) 1000 MG CAPS Take 1 capsule by mouth daily.   ? omeprazole (PRILOSEC) 40 MG capsule Take 1 capsule (40 mg total) by mouth in the morning and at bedtime.   ? traZODone (DESYREL) 100 MG tablet Take 2 tablets (200 mg total) by mouth at bedtime.   ? umeclidinium-vilanterol (  ANORO ELLIPTA) 62.5-25 MCG/INH AEPB Inhale 1 puff into the lungs daily.   ? vitamin B-12 (CYANOCOBALAMIN) 1000 MCG tablet Take 1 tablet by mouth daily.   ? vitamin E 1000 UNIT capsule Take 1,000 Units by mouth daily.   ? ?No facility-administered encounter medications on file as of 10/03/2021.  ? ?Have you had any problems recently with your health?Patient states that she is still having issues with her stomach hurts. She states that she has no enzymes in pancreas and has had for the last 3 years. She stated that she goes to the bathroom all day and has already  been 2 to 3 times just in the morning. Patient states that she still smokes but have cut back a lot, and uses the anoro inhaler. ? ?Have you had any problems with your pharmacy?Patient states that she does not have any problems with getting medications or the cost of medications from the pharmacy. ? ?What issues or side effects are you having with your medications?Patient states that the Hydroxyzine makes her sleepy ?And she does not like to take it because she uses other medication that makes her sleepy. ? ?What would you like me to pass along to Joneen Boers for them to help you with? Patient states that she has a lot of phlegm in throat and wants to know what she can do to remove it. ? ?What can we do to take care of you better? Patient states that she does not need anything else at this time ? ?Care Gaps: ?Colonoscopy-05/03/17 ?Diabetic Foot Exam-06/10/21 ?Mammogram-10/03/21 ?Ophthalmology-01/07/21 ?Dexa Scan - NA ?Annual Well Visit - 06/10/20 ?Micro albumin-06/07/21 ?Hemoglobin A1c- 09/06/21 ?  ?Star Rating Drugs: ?Lovastatin 40 mg-last fill   08/04/21  90ds              ? ?Ethelene Hal ?Clinical Pharmacist Assistant ?[phone]  ?

## 2021-10-06 ENCOUNTER — Telehealth: Payer: Self-pay

## 2021-10-06 ENCOUNTER — Encounter (HOSPITAL_COMMUNITY): Payer: Self-pay | Admitting: Psychiatry

## 2021-10-06 ENCOUNTER — Ambulatory Visit (HOSPITAL_COMMUNITY): Payer: Medicare Other | Admitting: Psychiatry

## 2021-10-06 VITALS — BP 157/76 | HR 78 | Resp 18 | Ht 63.0 in | Wt 101.4 lb

## 2021-10-06 DIAGNOSIS — F4322 Adjustment disorder with anxiety: Secondary | ICD-10-CM

## 2021-10-06 DIAGNOSIS — F4010 Social phobia, unspecified: Secondary | ICD-10-CM

## 2021-10-06 DIAGNOSIS — F332 Major depressive disorder, recurrent severe without psychotic features: Secondary | ICD-10-CM

## 2021-10-06 DIAGNOSIS — G4701 Insomnia due to medical condition: Secondary | ICD-10-CM | POA: Diagnosis not present

## 2021-10-06 MED ORDER — TRAZODONE HCL 100 MG PO TABS: 200.0000 mg | ORAL_TABLET | Freq: Every day | ORAL | 0 refills | Status: DC

## 2021-10-06 MED ORDER — HYDROXYZINE PAMOATE 25 MG PO CAPS
25.0000 mg | ORAL_CAPSULE | Freq: Two times a day (BID) | ORAL | 1 refills | Status: DC | PRN
Start: 2021-10-06 — End: 2021-12-01

## 2021-10-06 MED ORDER — LAMOTRIGINE 100 MG PO TABS: 100.0000 mg | ORAL_TABLET | Freq: Two times a day (BID) | ORAL | 0 refills | Status: DC

## 2021-10-06 NOTE — Telephone Encounter (Signed)
Patient called in states she needs something for a yeast infection. Informed patient that you may direct her to PCP for that. Patient got irate states that makes no sense. I said I would send it to you and you would make a decision. Patient then said, "I'm going to hang up on this B(word) and hung up phone. ?

## 2021-10-06 NOTE — Progress Notes (Signed)
BH MD/PA/NP OP Progress Note ? ?10/06/2021 3:31 PM ?Leslie Lane  ?MRN:  [id number] ? ?Chief Complaint:  ?Chief Complaint  ?Patient presents with  ? Depression  ? Anxiety  ? ?HPI: Leslie Lane reports that she is a "nervous mess". She has been  caring for her 68 year old mother full time by herself for the last 4 weeks. Her mother broke her hip and can not move around on her own. Leslie Lane does all the cooking, cleaning and care for her mother. It is overwhelming and her anxiety has increased. Leslie Lane feels like she can't handle any additional stress at this time. Leslie Lane is also worried about her own health. She shares that she is eating enough but her blood sugar is still low at times. She is in a lot of pain. She is getting about 5-6hrs of sleep with Trazodone but wakes up early each morning. She is tired and has poor concentration most days. Leslie Lane is endorsing anhedonia. She stopped Cymbalta after 1 week due to increased fatigue.  Leslie Lane is more depressed and reporting increased crying spells and increased irritability. Her social anxiety is high because she has to get things for her mom more often. She is isolating herself even when family come to see her. She denies SI/HI. ? ?Visit Diagnosis:  ?  ICD-10-CM   ?1. Adjustment disorder with anxious mood  F43.22 hydrOXYzine (VISTARIL) 25 MG capsule  ?  ?2. Major depressive disorder, recurrent, severe without psychotic features ([place])  F33.2 lamoTRIgine (LAMICTAL) 100 MG tablet  ?  traZODone (DESYREL) 100 MG tablet  ?  ?3. Social anxiety disorder  F40.10 lamoTRIgine (LAMICTAL) 100 MG tablet  ?  hydrOXYzine (VISTARIL) 25 MG capsule  ?  ?4. Insomnia due to medical condition  G47.01 traZODone (DESYREL) 100 MG tablet  ?  ? ? ?Past Psychiatric History: see previous notes ? ?Past Medical History:  ?Past Medical History:  ?Diagnosis Date  ? Abdominal pain 02/28/2016  ? Abnormal CT scan, bladder   ? Noted 07/28/13 CT - s/p uro eval 08/2013 [place] -   ? Acquired trigger finger of left ring  finger 02/18/2019  ? Injected February 18, 2019 and March 2022  ? Allergic rhinitis   ? Anemia   ? Arthritis   ? Bipolar disorder 07/05/2010  ? Cataract   ? Chronic lower back pain 07/05/2010  ? Chronic neck pain   ? Diabetic gastroparesis associated with type 1 diabetes mellitus 10/24/2016  ? Fibromyalgia   ? Generalized anxiety disorder 04/27/2014  ? GERD (gastroesophageal reflux disease) 07/05/2010  ? Hepatitis B virus infection 07/05/2010  ? History of substance abuse   ? opiates; cocaine  ? Hypoglycemia unawareness associated with type 1 diabetes mellitus 11/23/2020  ? Insomnia 04/13/2015  ? Major depressive disorder 07/28/2014  ? Migraine headache 07/05/2010  ? Mild neurocognitive disorder due to multiple etiologies 03/15/2021  ? Mixed diabetic hyperlipidemia associated with type 1 diabetes mellitus 07/05/2010  ? Neuropathy   ? Protein calorie malnutrition 06/01/2017  ? Right shoulder pain 02/18/2018  ? Seizures   ? pt states, "if my blood sugar drops to the 20's, I convulse.  It hasn't happened in a long time."  ? Smokers' cough 07/05/2010  ? Tubular adenoma of colon 2018  ? Type 1 diabetes mellitus with complication 28/76/8115  ? WBC decreased 03/27/2017  ?  ?Past Surgical History:  ?Procedure Laterality Date  ? CESAREAN SECTION    ? COLONOSCOPY    ? TUBAL LIGATION    ?  UPPER GASTROINTESTINAL ENDOSCOPY    ? ? ?Family Psychiatric and Medical History:  ?Family History  ?Problem Relation Age of Onset  ? Arthritis Mother   ? Diverticulosis Mother   ? Kidney disease Mother   ? Hyperlipidemia Mother   ? Arthritis Father   ? Kidney disease Father   ? Kidney cancer Father   ? Bladder Cancer Sister   ? Colon cancer Sister 83  ? Celiac disease Sister   ? Heart attack Brother   ? Kidney cancer Paternal Aunt   ? Diabetes Niece   ? Hyperlipidemia Other   ? Anxiety disorder Neg Hx   ? Bipolar disorder Neg Hx   ? Depression Neg Hx   ? Breast cancer Neg Hx   ? Esophageal cancer Neg Hx   ? Stomach cancer Neg Hx   ? Rectal  cancer Neg Hx   ? ? ?Social History:  ?Social History  ? ?Socioeconomic History  ? Marital status: Divorced  ?  Spouse name: Not on file  ? Number of children: 1  ? Years of education: 36  ? Highest education level: High school graduate  ?Occupational History  ? Occupation: Disability  ?Tobacco Use  ? Smoking status: Every Day  ?  Packs/day: 1.50  ?  Years: 50.00  ?  Pack years: 75.00  ?  Types: Cigarettes  ? Smokeless tobacco: Never  ?Vaping Use  ? Vaping Use: Former  ?Substance and Sexual Activity  ? Alcohol use: Yes  ?  Alcohol/week: 7.0 standard drinks  ?  Types: 7 Cans of beer per week  ?  Comment: 1 beer nightly  ? Drug use: Not Currently  ?  Types: Cocaine  ?  Comment: past opiate and cocaine abuse  ? Sexual activity: Not Currently  ?  Birth control/protection: Surgical  ?Other Topics Concern  ? Not on file  ?Social History Narrative  ? Divorced, disabled  ? Lives with parents  ? Right handed   ? ?Social Determinants of Health  ? ?Financial Resource Strain: Not on file  ?Food Insecurity: Not on file  ?Transportation Needs: Not on file  ?Physical Activity: Not on file  ?Stress: Not on file  ?Social Connections: Not on file  ? ? ?Allergies:  ?Allergies  ?Allergen Reactions  ? Penicillins Anaphylaxis  ? Sulfonamide Derivatives Anaphylaxis  ? ? ?Metabolic Disorder Labs: ?Lab Results  ?Component Value Date  ? HGBA1C 6.5 09/06/2021  ? ?No results found for: PROLACTIN ?Lab Results  ?Component Value Date  ? CHOL 209 (H) 06/07/2021  ? TRIG 64.0 06/07/2021  ? HDL 115.00 06/07/2021  ? CHOLHDL 2 06/07/2021  ? VLDL 12.8 06/07/2021  ? [place] 81 06/07/2021  ? [place] 84 09/16/2020  ? ?Lab Results  ?Component Value Date  ? TSH 0.71 06/07/2021  ? TSH 1.78 06/13/2018  ? ? ?Therapeutic Level Labs: ?No results found for: LITHIUM ?No results found for: VALPROATE ?No components found for:  CBMZ ? ?Current Medications: ?Current Outpatient Medications  ?Medication Sig Dispense Refill  ? Accu-Chek Softclix Lancets lancets Use as  instructed to check blood sugar 4 times per day dx code E10.65 400 each 1  ? Alcohol Swabs (B-D SINGLE USE SWABS REGULAR) PADS USE FOUR TIMES DAILY 400 each 2  ? aspirin EC 81 MG tablet Take 81 mg by mouth every morning.    ? Blood Glucose Calibration (ACCU-CHEK AVIVA) SOLN USE AS DIRECTED 1 each 1  ? Blood Glucose Monitoring Suppl (ACCU-CHEK AVIVA PLUS) w/Device KIT USE AS  DIRECTED 1 kit 0  ? Continuous Blood Gluc Sensor (FREESTYLE LIBRE 2 SENSOR) MISC Use to check blood sugar daily 2 each 12  ? donepezil (ARICEPT) 10 MG tablet Take 1 tablet every night 90 tablet 3  ? fexofenadine (ALLEGRA) 60 MG tablet Take 60 mg by mouth 2 (two) times daily.    ? Glucagon (GVOKE HYPOPEN 2-PACK) 1 MG/0.2ML SOAJ INJECT 1 MG UNDER THE SKIN AS NEEDED FOR LOW BLOOD SUGAR 0.4 mL 1  ? Glucagon (GVOKE HYPOPEN 2-PACK) 1 MG/0.2ML SOAJ Inject 1 mg into the skin as needed for low blood sugar. 0.4 mL 1  ? glucose blood (ACCU-CHEK AVIVA PLUS) test strip TEST BLOOD SUGAR 4 TIMES  DAILY 400 strip 3  ? hydrOXYzine (VISTARIL) 25 MG capsule Take 1 capsule (25 mg total) by mouth 2 (two) times daily as needed for anxiety. 60 capsule 1  ? hyoscyamine (LEVBID) 0.375 MG 12 hr tablet Take 1 tablet (0.375 mg total) by mouth 2 (two) times daily. 60 tablet 11  ? insulin detemir (LEVEMIR FLEXTOUCH) 100 UNIT/ML FlexPen Inject 3 units in the morning and 6 units at bedtime (Patient taking differently: Inject 3 units in the morning and 7 units at bedtime) 15 mL 5  ? insulin lispro (HUMALOG KWIKPEN) 100 UNIT/ML KwikPen inject 1-3 UNITS tid for large meals 15 mL 1  ? Insulin Pen Needle 32G X 4 MM MISC Use inject insulin tid 300 each 2  ? lovastatin (MEVACOR) 40 MG tablet TAKE 1 TABLET(40 MG) BY MOUTH AT BEDTIME 90 tablet 2  ? Magnesium 500 MG TABS Take 1 tablet by mouth as needed.    ? Multiple Vitamin (MULTIVITAMIN) tablet Take 1 tablet by mouth daily. Reported on 11/22/2015    ? Omega-3 Fatty Acids (FISH OIL) 1000 MG CAPS Take 1 capsule by mouth daily.    ?  omeprazole (PRILOSEC) 40 MG capsule Take 1 capsule (40 mg total) by mouth in the morning and at bedtime. 180 capsule 0  ? umeclidinium-vilanterol (ANORO ELLIPTA) 62.5-25 MCG/INH AEPB Inhale 1 puff into the lungs da

## 2021-10-07 ENCOUNTER — Other Ambulatory Visit: Payer: Self-pay | Admitting: Endocrinology

## 2021-10-07 MED ORDER — TERCONAZOLE 0.8 % VA CREA: 1.0000 | TOPICAL_CREAM | Freq: Every day | VAGINAL | 0 refills | Status: DC

## 2021-10-07 NOTE — Telephone Encounter (Signed)
FYI, Terazol cream has been prescribed ?

## 2021-10-15 DIAGNOSIS — E1065 Type 1 diabetes mellitus with hyperglycemia: Secondary | ICD-10-CM | POA: Diagnosis not present

## 2021-10-17 ENCOUNTER — Other Ambulatory Visit: Payer: Self-pay | Admitting: Endocrinology

## 2021-10-17 ENCOUNTER — Other Ambulatory Visit (HOSPITAL_COMMUNITY): Payer: Self-pay

## 2021-10-18 ENCOUNTER — Other Ambulatory Visit (HOSPITAL_COMMUNITY): Payer: Self-pay

## 2021-10-18 ENCOUNTER — Other Ambulatory Visit: Payer: Self-pay | Admitting: Endocrinology

## 2021-10-18 MED ORDER — GVOKE HYPOPEN 2-PACK 1 MG/0.2ML ~~LOC~~ SOAJ
1.0000 mg | SUBCUTANEOUS | 1 refills | Status: DC | PRN
  Filled 2021-10-18: qty 0.4, 7d supply, fill #0
  Filled 2021-11-14: qty 0.4, 7d supply, fill #1

## 2021-10-19 ENCOUNTER — Ambulatory Visit: Payer: Medicare Other | Admitting: Gastroenterology

## 2021-10-19 ENCOUNTER — Encounter: Payer: Self-pay | Admitting: Gastroenterology

## 2021-10-19 VITALS — BP 138/60 | HR 82 | Ht 63.5 in | Wt 101.4 lb

## 2021-10-19 DIAGNOSIS — K8689 Other specified diseases of pancreas: Secondary | ICD-10-CM

## 2021-10-19 DIAGNOSIS — R197 Diarrhea, unspecified: Secondary | ICD-10-CM

## 2021-10-19 MED ORDER — PANCRELIPASE (LIP-PROT-AMYL) 36000-114000 UNITS PO CPEP: ORAL_CAPSULE | ORAL | 11 refills | Status: AC

## 2021-10-19 MED ORDER — NA SULFATE-K SULFATE-MG SULF 17.5-3.13-1.6 GM/177ML PO SOLN: 1.0000 | Freq: Once | ORAL | 0 refills | Status: AC

## 2021-10-19 MED ORDER — HYOSCYAMINE SULFATE ER 0.375 MG PO TB12: 0.3750 mg | ORAL_TABLET | Freq: Two times a day (BID) | ORAL | 11 refills | Status: AC

## 2021-10-19 NOTE — Progress Notes (Signed)
? ? ?  History of Present Illness: This is a 66 year old female returning for follow-up of diarrhea, lower abdominal pain and weight loss.  She states her diarrhea has improved since resuming Creon.  It does not appear she is taking hyoscyamine.  She relates she has days where her diarrhea is controlled and other days where she will have 4-5 loose bowel movements per day. ? ?Current Medications, Allergies, Past Medical History, Past Surgical History, Family History and Social History were reviewed in [employer] record. ? ? ?Physical Exam: ?General: Well developed, well nourished, no acute distress ?Head: Normocephalic and atraumatic ?Eyes: Sclerae anicteric, EOMI ?Ears: Normal auditory acuity ?Mouth: Not examined, mask on during Covid-19 pandemic ?Lungs: Clear throughout to auscultation ?Heart: Regular rate and rhythm; no murmurs, rubs or bruits ?Abdomen: Soft, non tender and non distended. No masses, hepatosplenomegaly or hernias noted. Normal Bowel sounds ?Rectal: Not done ?Musculoskeletal: Symmetrical with no gross deformities  ?Pulses:  Normal pulses noted ?Extremities: No clubbing, cyanosis, edema or deformities noted ?Neurological: Alert oriented x 4, grossly nonfocal ?Psychological:  Alert and cooperative. Normal mood and affect ? ? ?Assessment and Recommendations: ? ?Diarrhea, weight loss, BMI = 17.68, lower abdominal pain, GERD, pancreatic insufficiency. R/O food, beverage intolerance, microscopic colitis, less likely IBD.  Maintain a food diary to look for food, beverage intolerances.  Continue Creon, Levbid, omeprazole.  Schedule colonoscopy.  The risks (including bleeding, perforation, infection, missed lesions, medication reactions and possible hospitalization or surgery if complications occur), benefits, and alternatives to colonoscopy with possible biopsy and possible polypectomy were discussed with the patient and they consent to proceed.   ?

## 2021-10-19 NOTE — Patient Instructions (Signed)
We have sent the following medications to your pharmacy for you to pick up at your convenience: Levbid and Creon. ? ?You have been scheduled for a colonoscopy. Please follow written instructions given to you at your visit today.  ?Please pick up your prep supplies at the pharmacy within the next 1-3 days. ?If you use inhalers (even only as needed), please bring them with you on the day of your procedure. ? ?Due to recent changes in healthcare laws, you may see the results of your imaging and laboratory studies on MyChart before your provider has had a chance to review them.  We understand that in some cases there may be results that are confusing or concerning to you. Not all laboratory results come back in the same time frame and the provider may be waiting for multiple results in order to interpret others.  Please give [place] 48 hours in order for your provider to thoroughly review all the results before contacting the office for clarification of your results.  ? ?The [facility] providers would like to encourage you to use [facility] to communicate with providers for non-urgent requests or questions.  Due to long hold times on the telephone, sending your provider a message by [facility] may be a faster and more efficient way to get a response.  Please allow 48 business hours for a response.  Please remember that this is for non-urgent requests.  ? ?Thank you for choosing me and [facility]. ? ?Malcolm T. Dagoberto Ligas., MD., [facility] ? ? ?

## 2021-11-15 ENCOUNTER — Other Ambulatory Visit (HOSPITAL_COMMUNITY): Payer: Self-pay

## 2021-11-15 DIAGNOSIS — E1065 Type 1 diabetes mellitus with hyperglycemia: Secondary | ICD-10-CM | POA: Diagnosis not present

## 2021-11-17 DIAGNOSIS — M329 Systemic lupus erythematosus, unspecified: Secondary | ICD-10-CM | POA: Diagnosis not present

## 2021-11-17 DIAGNOSIS — L298 Other pruritus: Secondary | ICD-10-CM | POA: Diagnosis not present

## 2021-11-17 DIAGNOSIS — L989 Disorder of the skin and subcutaneous tissue, unspecified: Secondary | ICD-10-CM | POA: Diagnosis not present

## 2021-11-17 DIAGNOSIS — R233 Spontaneous ecchymoses: Secondary | ICD-10-CM | POA: Diagnosis not present

## 2021-11-17 DIAGNOSIS — D492 Neoplasm of unspecified behavior of bone, soft tissue, and skin: Secondary | ICD-10-CM | POA: Diagnosis not present

## 2021-11-21 ENCOUNTER — Ambulatory Visit (INDEPENDENT_AMBULATORY_CARE_PROVIDER_SITE_OTHER): Payer: Medicare Other

## 2021-11-21 DIAGNOSIS — I700 Atherosclerosis of aorta: Secondary | ICD-10-CM

## 2021-11-21 DIAGNOSIS — R1084 Generalized abdominal pain: Secondary | ICD-10-CM

## 2021-11-21 DIAGNOSIS — F3132 Bipolar disorder, current episode depressed, moderate: Secondary | ICD-10-CM

## 2021-11-21 DIAGNOSIS — E108 Type 1 diabetes mellitus with unspecified complications: Secondary | ICD-10-CM

## 2021-11-21 NOTE — Patient Instructions (Signed)
Visit Information  Following are the goals we discussed today:   Monitor and Manage My Blood Sugars   Timeframe:  Long-Range Goal Priority:  High Start Date: 05/30/2021                            Expected End Date: 05/30/2022                      Follow Up Date 03/2022   - check blood sugar at prescribed times - check blood sugar before and after exercise - check blood sugar if I feel it is too high or too low - enter blood sugar readings and medication or insulin into daily log - take the blood sugar log to all doctor visits - take the blood sugar meter to all doctor visits    Why is this important?   Checking your blood sugar at home helps to keep it from getting very high or very low.  Writing the results in a diary or log helps the doctor know how to care for you.  Your blood sugar log should have the time, the date and the results.  Also, write down the amount of insulin or other medicine you take.  Other information like what you ate, exercise done and how you were feeling will also be helpful.  Plan: Telephone follow up appointment with care management team member scheduled for:  4 months The patient has been provided with contact information for the care management team and has been advised to call with any health related questions or concerns.   Tomasa Blase, PharmD Clinical Pharmacist, [facility]   Please call the care guide team at [phone] if you need to cancel or reschedule your appointment.   The patient verbalized understanding of instructions, educational materials, and care plan provided today and DECLINED offer to receive copy of patient instructions, educational materials, and care plan.

## 2021-11-21 NOTE — Progress Notes (Signed)
Chronic Care Management Pharmacy Note  11/21/2021 Name:  Leslie Lane MRN:  [id number] DOB:  03-07-1956  Summary: -Patient reports since last appointment with endo - denies any issues with hypoglycemia, confirms that she has been using libre - but was unable to give recent averages of her blood sugars  -Notes that diarrhea is controlled- having issues with abdominal pain - following with GI, colonoscopy scheduled for next month -Had tried hydroxyzine but stopped as if causes extreme fatigue in patient, no longer taking hyoscyamine either due to dry mouth she could not tolerate   Recommendations/Changes made from today's visit: -Recommending no changes to medications at this time- advised for patient to take [place] with her to endo appointment next month so that her meter can be downloaded and appropriate changes can be made if needed - pt reports to BG ranging from 110-230's depending on her diet  -Patient to continue current medications, to reach out with any issues or concerns regarding her medications -F/u in 4 months    Subjective: Leslie Lane is an 66 y.o. year old female who is a primary patient of [name], MD.  The CCM team was consulted for assistance with disease management and care coordination needs.    Engaged with patient by telephone for follow up visit in response to provider referral for pharmacy case management and/or care coordination services.   Consent to Services:  The patient was given the following information about Chronic Care Management services today, agreed to services, and gave verbal consent: 1. CCM service includes personalized support from designated clinical staff supervised by the primary care provider, including individualized plan of care and coordination with other care providers 2. 24/7 contact phone numbers for assistance for urgent and routine care needs. 3. Service will only be billed when office clinical staff spend 20 minutes or  more in a month to coordinate care. 4. Only one practitioner may furnish and bill the service in a calendar month. 5.The patient may stop CCM services at any time (effective at the end of the month) by phone call to the office staff. 6. The patient will be responsible for cost sharing (co-pay) of up to 20% of the service fee (after annual deductible is met). Patient agreed to services and consent obtained.  Patient Care Team: [name], MD as PCP - General (Internal Medicine) [name], MD as Consulting Physician (Endocrinology) Amil Amen, MD as Consulting Physician (Psychiatry) Ricard Dillon, MD as Consulting Physician (Psychiatry) Ladene Artist, MD (Gastroenterology) Franchot Gallo, MD (Urology) Cameron Sprang, MD as Consulting Physician (Neurology) Tomasa Blase, [facility] as Pharmacist (Pharmacist)  Recent office visits: 09/02/2021 - Dr. Quay Burow - yeast infection - diflucan rx'd  06/17/2021 - Dr. Sharlet Salina - no changes to medications   Recent consult visits: 10/19/2021 - Dr. Fuller Plan Gertie Fey - no changes to medications - colonoscopy ordered  09/08/2021 - Dr. Dwyane Dee - Endocrinology - PM levemir dose reduced to 6 units - can hold lunchtime humalog dose - f/u in 3 months  08/25/2021 - Dr. Fuller Plan Gertie Fey - no changes to medications - f/u in 2 months  08/02/2021 - Dr. Silas Flood - Pulmonology - no changes to medications - f/u in 6 months  07/21/2021 - Sharene Butters PA-C - neurology - no changes to medications- f/u in 6 months  07/18/2021 - Dr. Fuller Plan - gastro - lower abdominal pain - bentyl and metoclopramide stopped - glycopyrrolate started   Hospital visits: None in previous 6  months  Objective:  Lab Results  Component Value Date   CREATININE 0.77 09/06/2021   BUN 26 (H) 09/06/2021   GFR 79.84 09/06/2021   GFRNONAA >60 05/02/2015   GFRAA >60 05/02/2015   NA 138 09/06/2021   K 5.0 09/06/2021   CALCIUM 10.2 09/06/2021   CO2 33 (H) 09/06/2021   GLUCOSE 168 (H)  09/06/2021    Lab Results  Component Value Date/Time   HGBA1C 6.5 09/06/2021 10:56 AM   HGBA1C 6.6 (A) 06/09/2021 02:55 PM   HGBA1C 6.7 (H) 06/07/2021 03:20 PM   FRUCTOSAMINE 325 (H) 02/28/2021 10:54 AM   FRUCTOSAMINE 322 (H) 10/11/2017 09:24 AM   GFR 79.84 09/06/2021 10:56 AM   GFR 81.25 06/07/2021 03:20 PM   MICROALBUR 2.4 (H) 06/07/2021 03:20 PM   MICROALBUR 1.0 06/10/2020 01:43 PM    Last diabetic Eye exam:  Lab Results  Component Value Date/Time   HMDIABEYEEXA No Retinopathy 01/07/2021 09:09 AM    Last diabetic Foot exam:  No results found for: HMDIABFOOTEX   Lab Results  Component Value Date   CHOL 209 (H) 06/07/2021   HDL 115.00 06/07/2021   LDLCALC 81 06/07/2021   LDLDIRECT 101.7 03/26/2012   TRIG 64.0 06/07/2021   CHOLHDL 2 06/07/2021       Latest Ref Rng & Units 06/07/2021    3:20 PM 09/16/2020   10:36 AM 06/10/2020    1:43 PM  Hepatic Function  Total Protein 6.0 - 8.3 g/dL 7.2   6.9   6.9    Albumin 3.5 - 5.2 g/dL 4.7   4.2   4.3    AST 0 - 37 U/L 21   25   25     ALT 0 - 35 U/L 16   19   19     Alk Phosphatase 39 - 117 U/L 46   46   44    Total Bilirubin 0.2 - 1.2 mg/dL 0.4   0.5   0.5      Lab Results  Component Value Date/Time   TSH 0.71 06/07/2021 03:20 PM   TSH 1.78 06/13/2018 09:57 AM   FREET4 0.84 02/06/2013 11:50 AM       Latest Ref Rng & Units 06/10/2020    1:43 PM 06/04/2018   10:39 AM 03/20/2017   11:09 AM  CBC  WBC 4.0 - 10.5 K/uL 4.4   3.8   3.3    Hemoglobin 12.0 - 15.0 g/dL 12.0   12.9   13.8    Hematocrit 36.0 - 46.0 % 36.7   38.5   41.5    Platelets 150.0 - 400.0 K/uL 237.0   259.0   213.0      Lab Results  Component Value Date/Time   VD25OH 38.26 06/04/2018 10:39 AM   VD25OH 50.94 05/31/2016 11:25 AM    Clinical ASCVD: No  The ASCVD Risk score (Arnett DK, et al., 2019) failed to calculate for the following reasons:   The valid HDL cholesterol range is 20 to 100 mg/dL       10/06/2021   10:30 AM 09/09/2021   10:54 AM  08/11/2021   10:10 AM  Depression screen PHQ 2/9  Decreased Interest  2   Down, Depressed, Hopeless  2   PHQ - 2 Score  4   Altered sleeping  1   Tired, decreased energy  1   Change in appetite  1   Feeling bad or failure about yourself   0   Trouble concentrating  0  Moving slowly or fidgety/restless  0   Suicidal thoughts  0   PHQ-9 Score  7   Difficult doing work/chores  Somewhat difficult      Information is confidential and restricted. Go to Review Flowsheets to unlock data.    Social History   Tobacco Use  Smoking Status Every Day   Packs/day: 1.50   Years: 50.00   Pack years: 75.00   Types: Cigarettes  Smokeless Tobacco Never   BP Readings from Last 3 Encounters:  10/19/21 138/60  09/02/21 140/86  08/25/21 (!) 104/58   Pulse Readings from Last 3 Encounters:  10/19/21 82  09/02/21 76  08/25/21 82   Wt Readings from Last 3 Encounters:  10/19/21 101 lb 6 oz (46 kg)  09/08/21 102 lb 12.8 oz (46.6 kg)  09/02/21 101 lb 9.6 oz (46.1 kg)   BMI Readings from Last 3 Encounters:  10/19/21 17.68 kg/m  09/08/21 17.92 kg/m  09/02/21 18.00 kg/m    Assessment/Interventions: Review of patient past medical history, allergies, medications, health status, including review of consultants reports, laboratory and other test data, was performed as part of comprehensive evaluation and provision of chronic care management services.   SDOH:  (Social Determinants of Health) assessments and interventions performed: Yes  SDOH Screenings   Alcohol Screen: Not on file  Depression (PHQ2-9): Medium Risk   PHQ-2 Score: 15  Financial Resource Strain: Not on file  Food Insecurity: Not on file  Housing: Not on file  Physical Activity: Not on file  Social Connections: Not on file  Stress: Not on file  Tobacco Use: High Risk   Smoking Tobacco Use: Every Day   Smokeless Tobacco Use: Never   Passive Exposure: Not on file  Transportation Needs: Not on file    [place]  Allergies  Allergen Reactions   Penicillins Anaphylaxis   Sulfonamide Derivatives Anaphylaxis    Medications Reviewed Today     Reviewed by Tomasa Blase, [facility] (Pharmacist) on 11/21/21 at 1109  Med List Status: <None>   Medication Order Taking? Sig Documenting Provider Last Dose Status Informant  Accu-Chek Softclix Lancets lancets [id number] Yes Use as instructed to check blood sugar 4 times per day dx code E10.65 [name], MD Taking Active            Med Note Lesly Rubenstein Dec 16, 2020  3:28 PM) use  Alcohol Swabs (B-D SINGLE USE SWABS REGULAR) PADS [id number] Yes USE FOUR TIMES DAILY [name], MD Taking Active            Med Note Alfredia Ferguson A   Thu Dec 16, 2020  3:28 PM) use  aspirin EC 81 MG tablet [id number] Yes Take 81 mg by mouth every morning. [provider] Taking Active Self  Blood Glucose Calibration ([place]) SOLN [id number] Yes USE AS DIRECTED [name], MD Taking Active            Med Note Lesly Rubenstein Dec 16, 2020  3:29 PM) use  Blood Glucose Monitoring Suppl (ACCU-CHEK AVIVA PLUS) w/Device Drucie Opitz [id number] Yes USE AS DIRECTED [name], MD Taking Active            Med Note Alfredia Ferguson A   Thu Dec 16, 2020  3:29 PM) use  Continuous Blood Gluc Sensor (FREESTYLE LIBRE 2 SENSOR) [place] [id number] Yes Use to check blood sugar daily [name], MD Taking Active   donepezil (ARICEPT) 10 MG tablet [id number]  Yes Take 1 tablet every night Rondel Jumbo, PA-C Taking Active   fexofenadine (ALLEGRA) 180 MG tablet [id number] Yes Take 180 mg by mouth daily. [provider] Taking Active   Glucagon (GVOKE HYPOPEN 2-PACK) 1 MG/0.2ML SOAJ [id number]  Inject 1 mg into the skin as needed for low blood sugar. [name], MD  Active   glucose blood (ACCU-CHEK AVIVA PLUS) test strip [id number]  TEST BLOOD SUGAR 4 TIMES  DAILY [name], MD  Active   hydrOXYzine (VISTARIL) 25 MG capsule [id number] No Take 1 capsule (25 mg total)  by mouth 2 (two) times daily as needed for anxiety.  Patient not taking: Reported on 11/21/2021   Charlcie Cradle, MD Not Taking Active   hyoscyamine (LEVBID) 0.375 MG 12 hr tablet [id number] No Take 1 tablet (0.375 mg total) by mouth 2 (two) times daily.  Patient not taking: Reported on 11/21/2021   Ladene Artist, MD Not Taking Active   insulin detemir (LEVEMIR FLEXTOUCH) 100 UNIT/ML FlexPen [id number] Yes Inject 3 units in the morning and 6 units at bedtime  Patient taking differently: Inject 3 units in the morning and 7 units at bedtime   [name], MD Taking Active   insulin lispro (HUMALOG KWIKPEN) 100 UNIT/ML KwikPen [id number] Yes inject 1-3 UNITS tid for large meals [name], MD Taking Active   Insulin Pen Needle 32G X 4 MM MISC [id number]  Use inject insulin tid [name], MD  Active   lamoTRIgine (LAMICTAL) 100 MG tablet [id number] Yes Take 1 tablet (100 mg total) by mouth 2 (two) times daily. Charlcie Cradle, MD Taking Active   lipase/protease/amylase (CREON) 36000 UNITS CPEP capsule [id number] Yes Take 2 capsules by mouth three times a day with meals and 1 capsule by mouth twice daily with snacks Ladene Artist, MD Taking Active   lovastatin (MEVACOR) 40 MG tablet [id number] Yes TAKE 1 TABLET(40 MG) BY MOUTH AT BEDTIME [name], MD Taking Active   Magnesium 500 MG TABS [id number] Yes Take 1 tablet by mouth daily. [provider] Taking Active   Multiple Vitamin (MULTIVITAMIN) tablet [id number] Yes Take 1 tablet by mouth daily. Reported on 11/22/2015 [provider] Taking Active Self           Med Note Driscilla Grammes Sep 20, 2020  2:59 PM) Taking gummy vitamins  Omega-3 Fatty Acids (FISH OIL) 1000 MG CAPS [id number] Yes Take 1 capsule by mouth daily. [provider] Taking Active   omeprazole (PRILOSEC) 40 MG capsule [id number] Yes Take 1 capsule (40 mg total) by mouth in the morning and at bedtime. [name], MD Taking  Active   traZODone (DESYREL) 100 MG tablet [id number] Yes Take 2 tablets (200 mg total) by mouth at bedtime. Charlcie Cradle, MD Taking Active   umeclidinium-vilanterol [facility] ELLIPTA) 62.5-25 MCG/INH AEPB [id number] Yes Inhale 1 puff into the lungs daily. Hunsucker, Bonna Gains, MD Taking Active   vitamin E 1000 UNIT capsule [id number] Yes Take 1,000 Units by mouth daily. [provider] Taking Active Self            Patient Active Problem List   Diagnosis Date Noted   Pancreatic insufficiency 04/29/2021   Aortic atherosclerosis ([place]) 04/29/2021   History of substance abuse 03/15/2021   Mild neurocognitive disorder due to multiple etiologies 03/15/2021   Fibromyalgia    Hypoglycemia unawareness associated with type 1 diabetes mellitus 11/23/2020   Acquired trigger finger of left ring finger 02/18/2019  Right shoulder pain 02/18/2018   Protein calorie malnutrition 06/01/2017   WBC decreased 03/27/2017   Diabetic gastroparesis associated with type 1 diabetes mellitus 10/24/2016   Abdominal pain 02/28/2016   Insomnia 04/13/2015   Major depressive disorder 07/28/2014   Generalized anxiety disorder 04/27/2014   Abnormal CT scan, bladder    Type 1 diabetes mellitus with complication 16/04/9603   Hepatitis B virus infection 07/05/2010   Mixed diabetic hyperlipidemia associated with type 1 diabetes mellitus ([place]) 07/05/2010   Bipolar disorder 07/05/2010   Smokers' cough ([place]) 07/05/2010   Migraine headache 07/05/2010   GERD (gastroesophageal reflux disease) 07/05/2010   Chronic lower back pain 07/05/2010    Immunization History  Administered Date(s) Administered   Fluad Quad(high Dose 65+) 04/24/2019, 04/20/2020, 04/28/2021   Influenza Split 03/22/2011, 03/26/2012   Influenza,inj,Quad PF,6+ Mos 02/28/2013, 04/13/2014, 05/13/2015, 02/28/2016, 03/26/2017, 06/04/2018   PFIZER(Purple Top)SARS-COV-2 Vaccination 10/01/2019, 10/29/2019, 06/21/2020   Pneumococcal Conjugate-13  07/24/2016   Pneumococcal Polysaccharide-23 07/31/2013, 06/17/2019   Td 07/28/2011   Zoster, Live 05/31/2016    Conditions to be addressed/monitored:  Hypertension, Hyperlipidemia, Diabetes, and Depression  Care Plan : [place]  Updates made by Tomasa Blase, [place] since 11/21/2021 12:00 AM     Problem: Hypertension, Hyperlipidemia, Diabetes, and Depression   Priority: High  Onset Date: 05/30/2021     Long-Range Goal: Disease Management   Start Date: 05/30/2021  Expected End Date: 11/22/2022  Recent Progress: On track  Priority: High  Note:    Current Barriers:  Chronic Disease Management support, education, and care coordination needs related to Hyperlipidemia, Diabetes, Depression, Anxiety, and Tobacco use  Hyperlipidemia / Cardiovascular risk reduction (goal: LDL <100) Lab Results  Component Value Date   LDLCALC 81 06/07/2021  Current regimen:  lovastatin 40 mg at bedtime aspirin 81 mg daily,  fish oil OTC Interventions: Discussed cholesterol goals and benefits of medication for prevention of heart attack / stroke Discussed benefits of statin for stroke prevention outweigh an risk of side effects Patient self care activities, patient will: Continue medications as prescribed  Diabetes (Type 1) (goal: A1c <7.0%) Lab Results  Component Value Date   HGBA1C 6.5 09/06/2021  Current regimen:  Levemir 3 units in the AM and 6-7 units in the PM based on diet and BG throughout the day  Humalog 1-3 units with meals Freestyle Libre 2 Interventions: Discussed with patient current level of BG control, - pt was unable to determine recent BG averages - notes that she is not having issues with hypoglycemia at this time - BG ranging from 110-230 depending on her diet  Patient self care activities, patient will: Check blood sugar via [place], has appointment with endo next month she will bring meter so that it can be downloaded  Contact provider with any  episodes of hypoglycemia   Depression/Anxiety (goal: promotion of stable mood) Current regimen:  Lamotrigine 100 mg twice a day Hydroxyzine 54m twice daily as needed - not taking - caused extreme fatigue Trazodone 2018mhs  Patient self care activities, patient will: Follow up with psychiatry as scheduled  Smoking cessation (goal: Quit Smoking) Current regimen:  No medications. Smoking 2 pack-per-day Interventions: Provided [place] Quit Line number for more resources and counseling, patient reports that she is not interested in quitting at this time  Patient self care activities - Over the next 90 days, patient will: Contact [place] Quit Line (1-800-QUIT-NOW or [phone] Medication management Pharmacist Clinical Goal(s): Patient will work with PharmD and providers  to achieve optimal medication adherence Current pharmacy: [facility] Interventions Comprehensive medication review performed. Patient self care activities -patient will: Focus on medication adherence by fill date Take medications as prescribed Report any questions or concerns to PharmD and/or provider(s)       Medication Assistance: None required.  Patient affirms current coverage meets needs.  Care Gaps: Shingrix Covid booster  Tdap  Patient's preferred pharmacy is:  [facility] [place], [address] [place] [address] Phone: [phone] Fax: [phone]  [facility] ([facility] ) - [place], [address] [place] [address] Phone: [phone] Fax: [phone]   Uses pill box? Yes Pt endorses 100% compliance  Care Plan and Follow Up Patient Decision:  Patient agrees to Care Plan and Follow-up.  Plan: Telephone follow up appointment with care management team member scheduled for:  4 months The patient has been provided with contact  information for the care management team and has been advised to call with any health related questions or concerns.   Tomasa Blase, PharmD Clinical Pharmacist, [place]

## 2021-11-22 ENCOUNTER — Other Ambulatory Visit: Payer: Self-pay | Admitting: Neurology

## 2021-11-22 ENCOUNTER — Encounter: Payer: Self-pay | Admitting: Gastroenterology

## 2021-11-28 ENCOUNTER — Encounter: Payer: Self-pay | Admitting: Certified Registered Nurse Anesthetist

## 2021-11-30 DIAGNOSIS — F17210 Nicotine dependence, cigarettes, uncomplicated: Secondary | ICD-10-CM | POA: Diagnosis not present

## 2021-11-30 DIAGNOSIS — E1069 Type 1 diabetes mellitus with other specified complication: Secondary | ICD-10-CM

## 2021-11-30 DIAGNOSIS — Z794 Long term (current) use of insulin: Secondary | ICD-10-CM

## 2021-11-30 DIAGNOSIS — E785 Hyperlipidemia, unspecified: Secondary | ICD-10-CM

## 2021-11-30 DIAGNOSIS — F419 Anxiety disorder, unspecified: Secondary | ICD-10-CM

## 2021-12-01 ENCOUNTER — Encounter (HOSPITAL_COMMUNITY): Payer: Self-pay | Admitting: Psychiatry

## 2021-12-01 ENCOUNTER — Ambulatory Visit (HOSPITAL_COMMUNITY): Payer: Medicare Other | Admitting: Psychiatry

## 2021-12-01 DIAGNOSIS — F4010 Social phobia, unspecified: Secondary | ICD-10-CM

## 2021-12-01 DIAGNOSIS — F332 Major depressive disorder, recurrent severe without psychotic features: Secondary | ICD-10-CM | POA: Diagnosis not present

## 2021-12-01 DIAGNOSIS — G4701 Insomnia due to medical condition: Secondary | ICD-10-CM

## 2021-12-01 MED ORDER — TRAZODONE HCL 100 MG PO TABS: 200.0000 mg | ORAL_TABLET | Freq: Every day | ORAL | 0 refills | Status: DC

## 2021-12-01 MED ORDER — LAMOTRIGINE 100 MG PO TABS: 100.0000 mg | ORAL_TABLET | Freq: Two times a day (BID) | ORAL | 0 refills | Status: DC

## 2021-12-01 NOTE — Progress Notes (Signed)
BH MD/PA/NP OP Progress Note  12/01/2021 2:55 PM Leslie Lane  MRN:  [id number]  Chief Complaint:  Chief Complaint  Patient presents with   Depression   Anxiety   HPI: Leslie Lane has been having increased anxiety. "I worry about everything". Her mom broke her hip and has constant GI upset. Her mom is not breaking well and Leslie Lane has cut back on smoking because of it. Her daughter doesn't want Stella to watch her infant granddaughter due to Leslie Lane's DM type 1. This hurt Leslie Lane feelings. Her sister is in the hospital today due to retaining fluids (unsure of exact reason). All of this is overwhelming and Leslie Lane has been feeling more depressed. Leslie Lane knows her own health is getting worse and has several doctor appointments this month. Her depression is constant and her level is 8/10 (10 being the worst). She has been engaging in appropriate personal hygiene on a daily basis. She is endorsing anhedonia. Her sleep is fair with Trazodone. Her energy and appetite are poor. She only eats because she has to with insulin. Leslie Lane has increased crying spells and is always thinking badly of herself. Leslie Lane has passive thoughts of death and wishes to be dead. She denies SI/HI. Her social anxiety is high and she only goes to the grocery stores for her mom. Leslie Lane shares that her stress tolerance has really gone down. She is scared to try any new medications. Vistaril made her feel like a zombie.   [place] a 10 pack of cigarettes about every 10 days.   Visit Diagnosis:    ICD-10-CM   1. Major depressive disorder, recurrent, severe without psychotic features (HCC)  F33.2 lamoTRIgine (LAMICTAL) 100 MG tablet    traZODone (DESYREL) 100 MG tablet    2. Social anxiety disorder  F40.10 lamoTRIgine (LAMICTAL) 100 MG tablet    3. Insomnia due to medical condition  G47.01 traZODone (DESYREL) 100 MG tablet      Past Psychiatric History: no updates or changes since initial evaluation  Past Medical History:  Past Medical  History:  Diagnosis Date   Abdominal pain 02/28/2016   Abnormal CT scan, bladder    Noted 07/28/13 CT - s/p uro eval 08/2013 [place] -    Acquired trigger finger of left ring finger 02/18/2019   Injected February 18, 2019 and March 2022   Allergic rhinitis    Anemia    Arthritis    Bipolar disorder 07/05/2010   Cataract    Chronic lower back pain 07/05/2010   Chronic neck pain    Diabetic gastroparesis associated with type 1 diabetes mellitus 10/24/2016   Fibromyalgia    Generalized anxiety disorder 04/27/2014   GERD (gastroesophageal reflux disease) 07/05/2010   Hepatitis B virus infection 07/05/2010   History of substance abuse    opiates; cocaine   Hypoglycemia unawareness associated with type 1 diabetes mellitus 11/23/2020   Insomnia 04/13/2015   Major depressive disorder 07/28/2014   Migraine headache 07/05/2010   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  It hasn't happened in a long time."   Smokers' cough 07/05/2010   Tubular adenoma of colon 2018   Type 1 diabetes mellitus with complication 89/16/9450   WBC decreased 03/27/2017    Past  Surgical History:  Procedure Laterality Date   CESAREAN SECTION     COLONOSCOPY     TUBAL LIGATION     UPPER GASTROINTESTINAL ENDOSCOPY      Family Psychiatric and Medical  History:  Family History  Problem Relation Age of Onset   Arthritis Mother    Diverticulosis Mother    Kidney disease Mother    Hyperlipidemia Mother    Arthritis Father    Kidney disease Father    Kidney cancer Father    Bladder Cancer Sister    Colon cancer Sister 44   Celiac disease Sister    Heart attack Brother    Kidney cancer Paternal Aunt    Diabetes Niece    Hyperlipidemia  Other    Anxiety disorder Neg Hx    Bipolar disorder Neg Hx    Depression Neg Hx    Breast cancer Neg Hx    Esophageal cancer Neg Hx    Stomach cancer Neg Hx    Rectal cancer Neg Hx     Social History:  Social History   Socioeconomic History   Marital status: Divorced    Spouse name: Not on file   Number of children: 1   Years of education: 12   Highest education level: High school graduate  Occupational History   Occupation: Disability  Tobacco Use   Smoking status: Every Day    Packs/day: 1.50    Years: 50.00    Pack years: 75.00    Types: Cigarettes   Smokeless tobacco: Never   Tobacco comments:    Reports she has cut back to a cartoon that now lasts 2 weeks.   Vaping Use   Vaping Use: Former  Substance and Sexual Activity   Alcohol use: Yes    Alcohol/week: 7.0 standard drinks    Types: 7 Cans of beer per week    Comment: 1 beer nightly   Drug use: Not Currently    Types: Cocaine    Comment: past opiate and cocaine abuse   Sexual activity: Not Currently    Birth control/protection: Surgical  Other Topics Concern   Not on file  Social History Narrative   Divorced, disabled   Lives with parents   Right handed    Social Determinants of Health   Financial Resource Strain: Not on file  Food Insecurity: Not on file  Transportation Needs: Not on file  Physical Activity: Not on file  Stress: Not on file  Social Connections: Not on file    Allergies:  Allergies  Allergen Reactions   Penicillins Anaphylaxis   Sulfonamide Derivatives Anaphylaxis    Metabolic Disorder Labs: Lab Results  Component Value Date   HGBA1C 6.5 09/06/2021   No results found for: PROLACTIN Lab Results  Component Value Date   CHOL 209 (H) 06/07/2021   TRIG 64.0 06/07/2021   HDL 115.00 06/07/2021   CHOLHDL 2 06/07/2021   VLDL 12.8 06/07/2021   LDLCALC 81 06/07/2021   LDLCALC 84 09/16/2020   Lab Results  Component Value Date   TSH 0.71 06/07/2021   TSH 1.78 06/13/2018     Therapeutic Level Labs: No results found for: LITHIUM No results found for: VALPROATE No components found for:  CBMZ  Current Medications: Current Outpatient Medications  Medication Sig Dispense Refill   Accu-Chek Softclix Lancets lancets Use as instructed to check blood sugar 4 times per day dx code E10.65 400 each 1   Alcohol Swabs (B-D SINGLE USE SWABS REGULAR) PADS USE FOUR TIMES  DAILY 400 each 2   aspirin EC 81 MG tablet Take 81 mg by mouth every morning.     Blood Glucose Calibration (ACCU-CHEK AVIVA) SOLN USE AS DIRECTED 1 each 1   Blood Glucose Monitoring Suppl (ACCU-CHEK AVIVA PLUS) w/Device KIT USE AS DIRECTED 1 kit 0   Continuous Blood Gluc Sensor (FREESTYLE LIBRE 2 SENSOR) MISC Use to check blood sugar daily 2 each 12   donepezil (ARICEPT) 10 MG tablet Take 1 tablet every night 90 tablet 3   fexofenadine (ALLEGRA) 180 MG tablet Take 180 mg by mouth daily.     Glucagon (GVOKE HYPOPEN 2-PACK) 1 MG/0.2ML SOAJ Inject 1 mg into the skin as needed for low blood sugar. 0.4 mL 1   glucose blood (ACCU-CHEK AVIVA PLUS) test strip TEST BLOOD SUGAR 4 TIMES  DAILY 400 strip 3   insulin detemir (LEVEMIR FLEXTOUCH) 100 UNIT/ML FlexPen Inject 3 units in the morning and 6 units at bedtime (Patient taking differently: Inject 3 units in the morning and 7 units at bedtime) 15 mL 5   insulin lispro (HUMALOG KWIKPEN) 100 UNIT/ML KwikPen inject 1-3 UNITS tid for large meals 15 mL 1   Insulin Pen Needle 32G X 4 MM MISC Use inject insulin tid 300 each 2   lipase/protease/amylase (CREON) 36000 UNITS CPEP capsule Take 2 capsules by mouth three times a day with meals and 1 capsule by mouth twice daily with snacks 240 capsule 11   lovastatin (MEVACOR) 40 MG tablet TAKE 1 TABLET(40 MG) BY MOUTH AT BEDTIME 90 tablet 2   Magnesium 500 MG TABS Take 1 tablet by mouth daily.     Multiple Vitamin (MULTIVITAMIN) tablet Take 1 tablet by mouth daily. Reported on 11/22/2015     Omega-3 Fatty Acids (FISH OIL) 1000  MG CAPS Take 1 capsule by mouth daily.     omeprazole (PRILOSEC) 40 MG capsule Take 1 capsule (40 mg total) by mouth in the morning and at bedtime. 180 capsule 0   umeclidinium-vilanterol (ANORO ELLIPTA) 62.5-25 MCG/INH AEPB Inhale 1 puff into the lungs daily. 60 each 11   vitamin E 1000 UNIT capsule Take 1,000 Units by mouth daily.     hyoscyamine (LEVBID) 0.375 MG 12 hr tablet Take 1 tablet (0.375 mg total) by mouth 2 (two) times daily. (Patient not taking: Reported on 11/21/2021) 60 tablet 11   lamoTRIgine (LAMICTAL) 100 MG tablet Take 1 tablet (100 mg total) by mouth 2 (two) times daily. 180 tablet 0   traZODone (DESYREL) 100 MG tablet Take 2 tablets (200 mg total) by mouth at bedtime. 180 tablet 0   No current facility-administered medications for this visit.     Musculoskeletal: Strength & Muscle Tone: decreased Gait & Station: normal Patient leans:  straight  Psychiatric Specialty Exam: Review of Systems  Blood pressure (!) 177/80, pulse 71, temperature 98.8 F (37.1 C), height 5' 4.75" (1.645 m), weight 103 lb (46.7 kg), SpO2 98 %.Body mass index is 17.27 kg/m.  General Appearance: Casual and Neat  Eye Contact:  Good  Speech:  Clear and Coherent and Normal Rate  Volume:  Normal  Mood:  Anxious and Depressed  Affect:  Congruent  Thought Process:  Goal Directed, Linear, and Descriptions of Associations: Intact  Orientation:  Full (Time, Place, and Person)  Thought Content: Logical   Suicidal Thoughts:  No  Homicidal Thoughts:  No  Memory:  Immediate;   Poor  Judgement:  Poor  Insight:  Present  Psychomotor Activity:  Normal  Concentration:  Concentration: Fair  Recall:  [employer] of Knowledge: Good  Language: Good  Akathisia:  No  Handed:  Right  AIMS (if indicated): not done  Assets:  Communication Skills Desire for Improvement Financial Resources/Insurance Housing Social Support Talents/Skills Transportation Vocational/Educational  ADL's:  Intact   Cognition: WNL  Sleep:  Fair   Screenings: GAD-7    [place] Office Visit from 12/16/2020 in [place] at [facility] Visit from 06/17/2019 in [place] Visit from 11/27/2017 in [place]  Total GAD-7 Score 17 19 18       [place] Visit from 12/03/2020 in [facility] [place] from 05/30/2017 in [place]  Total Score (max 30 points ) 22 27      PHQ2-9    [place] Visit from 12/01/2021 in [facility] Office Visit from 10/06/2021 in [facility] Office Visit from 09/02/2021 in [place] [facility] Video Visit from 08/11/2021 in [facility] Video Visit from 06/02/2021 in [facility]  PHQ-2 Total Score 6 5 4 6 6   PHQ-9 Total Score 20 15 7 13 20       [place] Visit from 12/01/2021 in [facility] Office Visit from 10/06/2021 in [facility] Video Visit from 08/11/2021 in [facility]  C-SSRS RISK CATEGORY Error: Q3, 4, or 5 should not be populated when Q2 is No No Risk No Risk        Assessment and Plan:   Collaboration of Care: Collaboration of Care: Referral or follow-up with counselor/therapist AEB referred to therapy  Patient/Guardian was advised Release of Information must be obtained prior to any record release in order to collaborate their care with an outside provider. Patient/Guardian was advised if they have not already done so to contact the registration department to sign all necessary forms in order for [place] to release information regarding their care.   Consent: Patient/Guardian gives verbal consent for treatment and  assignment of benefits for services provided during this visit. Patient/Guardian expressed understanding and agreed to proceed.    Medication management with supportive therapy. Risks and benefits, side effects and alternative treatment options discussed with patient. Pt was given an opportunity to ask questions about medication, illness, and treatment. All current psychiatric medications have been reviewed and discussed with the patient and adjusted as clinically appropriate. The patient has been provided an accurate and updated list of the medications being now prescribed. Pt verbalized understanding and verbal consent obtained for treatment.  Status of current problems: worsening of depression an anxiety  Meds:  Taneshia does not want to try any new meds at this time.   1. Major depressive disorder, recurrent, severe without psychotic features ([place]) - lamoTRIgine (LAMICTAL) 100 MG tablet; Take 1 tablet (100 mg total) by mouth 2 (two) times daily.  Dispense: 180 tablet; Refill: 0 - traZODone (DESYREL) 100 MG tablet; Take 2 tablets (200 mg total) by mouth at bedtime.  Dispense: 180 tablet; Refill: 0  2. Social anxiety disorder - lamoTRIgine (LAMICTAL) 100 MG tablet; Take 1 tablet (100 mg total) by mouth 2 (two) times daily.  Dispense: 180 tablet; Refill: 0  3. Insomnia due to medical condition - traZODone (DESYREL) 100 MG tablet; Take 2 tablets (200 mg total) by mouth at bedtime.  Dispense: 180 tablet;  Refill: 0    Therapy: brief supportive therapy provided. Discussed psychosocial stressors in detail.  I am referring her for therapy since medications have not been very effective.    F/up in 2-3 months or sooner if needed  The duration of this appointment visit was 15 minutes of face-to-face time with the patient.  Greater than 50% of this time was spent in counseling, explanation of  diagnosis, planning of further management, and coordination of care    Charlcie Cradle, MD 12/01/2021, 2:55  PM

## 2021-12-02 ENCOUNTER — Other Ambulatory Visit: Payer: Self-pay | Admitting: Internal Medicine

## 2021-12-03 ENCOUNTER — Other Ambulatory Visit: Payer: Self-pay | Admitting: Internal Medicine

## 2021-12-05 ENCOUNTER — Encounter: Payer: Self-pay | Admitting: Gastroenterology

## 2021-12-05 ENCOUNTER — Ambulatory Visit (AMBULATORY_SURGERY_CENTER): Payer: Medicare Other | Admitting: Gastroenterology

## 2021-12-05 VITALS — BP 128/63 | HR 78 | Temp 98.0°F | Resp 11 | Ht 63.0 in | Wt 101.0 lb

## 2021-12-05 DIAGNOSIS — R634 Abnormal weight loss: Secondary | ICD-10-CM

## 2021-12-05 DIAGNOSIS — Z86010 Personal history of colonic polyps: Secondary | ICD-10-CM | POA: Diagnosis not present

## 2021-12-05 DIAGNOSIS — R197 Diarrhea, unspecified: Secondary | ICD-10-CM

## 2021-12-05 MED ORDER — SODIUM CHLORIDE 0.9 % IV SOLN: 500.0000 mL | Freq: Once | INTRAVENOUS | Status: DC

## 2021-12-05 NOTE — Progress Notes (Signed)
History & Physical  Primary Care Physician:  [name], MD Primary Gastroenterologist: Lucio Edward, MD  CHIEF COMPLAINT: Diarrhea, weight loss, Personal history of colon polyps   HPI: Leslie Lane is a 66 y.o. female with a history of diarrhea, weight loss, and a personal history of adenomatous colon polyps for colonoscopy.   Past Medical History:  Diagnosis Date   Abdominal pain 02/28/2016   Abnormal CT scan, bladder    Noted 07/28/13 CT - s/p uro eval 08/2013 [place] -    Acquired trigger finger of left ring finger 02/18/2019   Injected February 18, 2019 and March 2022   Allergic rhinitis    Anemia    Arthritis    Bipolar disorder 07/05/2010   Cataract    Chronic lower back pain 07/05/2010   Chronic neck pain    Diabetic gastroparesis associated with type 1 diabetes mellitus 10/24/2016   Fibromyalgia    Generalized anxiety disorder 04/27/2014   GERD (gastroesophageal reflux disease) 07/05/2010   Hepatitis B virus infection 07/05/2010   History of substance abuse    opiates; cocaine   Hypoglycemia unawareness associated with type 1 diabetes mellitus 11/23/2020   Insomnia 04/13/2015   Major depressive disorder 07/28/2014   Migraine headache 07/05/2010   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  It hasn't happened in a long time."   Smokers' cough 07/05/2010   Tubular adenoma of colon 2018   Type 1 diabetes mellitus with complication 54/65/6812   WBC decreased 03/27/2017    Past Surgical History:  Procedure Laterality Date   CESAREAN SECTION     COLONOSCOPY     TUBAL LIGATION     UPPER GASTROINTESTINAL ENDOSCOPY      Prior to Admission medications   Medication Sig Start Date  End Date Taking? Authorizing Provider  Accu-Chek Softclix Lancets lancets Use as instructed to check blood sugar 4 times per day dx code E10.65 01/28/20  Yes [name], MD  Alcohol Swabs (B-D SINGLE USE SWABS REGULAR) PADS USE FOUR TIMES DAILY 01/28/20  Yes [name], MD  aspirin EC 81 MG tablet Take 81 mg by mouth every morning.   Yes [provider]  B-D ULTRAFINE III SHORT PEN 31G X 8 MM MISC SMARTSIG:pre-filled pen syringe SUB-Q Daily 09/15/21  Yes [provider]  Blood Glucose Calibration (ACCU-CHEK AVIVA) SOLN USE AS DIRECTED 01/28/20  Yes [name], MD  Blood Glucose Monitoring Suppl (ACCU-CHEK AVIVA PLUS) w/Device KIT USE AS DIRECTED 12/19/16  Yes [name], MD  Clobetasol Propionate 0.05 % shampoo Apply topically. 11/16/21  Yes [provider]  Continuous Blood Gluc Sensor (FREESTYLE LIBRE 2 SENSOR) MISC Use to check blood sugar daily 09/08/21  Yes [name], MD  donepezil (ARICEPT) 10 MG tablet Take 1 tablet every night 07/21/21  Yes Wertman, Coralee Pesa, PA-C  fexofenadine (ALLEGRA) 180 MG tablet Take 180 mg by mouth daily.   Yes [provider]  glucose blood (ACCU-CHEK AVIVA PLUS) test strip TEST BLOOD SUGAR 4 TIMES  DAILY 10/03/21  Yes [name], MD  insulin detemir (LEVEMIR FLEXTOUCH) 100 UNIT/ML FlexPen Inject 3 units in the morning and 6 units at bedtime Patient taking differently: Inject 3 units in the morning  and 7 units at bedtime 12/09/20  Yes [name], MD  insulin lispro (HUMALOG KWIKPEN) 100 UNIT/ML KwikPen inject 1-3 UNITS tid for large meals 03/03/21  Yes [name], MD  Insulin Pen Needle 32G X 4 MM MISC Use inject insulin tid 03/03/21  Yes [name], MD  lamoTRIgine (LAMICTAL) 100 MG tablet Take 1 tablet (100 mg total) by mouth 2 (two) times daily. 12/01/21  Yes Charlcie Cradle, MD  lipase/protease/amylase (CREON) 36000 UNITS CPEP capsule Take 2 capsules by mouth three times a day with meals and 1 capsule by mouth twice daily with snacks  10/19/21  Yes Ladene Artist, MD  lovastatin (MEVACOR) 40 MG tablet TAKE 1 TABLET BY MOUTH AT  BEDTIME 12/02/21  Yes [name], MD  Magnesium 500 MG TABS Take 1 tablet by mouth daily.   Yes [provider]  Multiple Vitamin (MULTIVITAMIN) tablet Take 1 tablet by mouth daily. Reported on 11/22/2015   Yes [provider]  Omega-3 Fatty Acids (FISH OIL) 1000 MG CAPS Take 1 capsule by mouth daily.   Yes [provider]  omeprazole (PRILOSEC) 40 MG capsule Take 1 capsule (40 mg total) by mouth in the morning and at bedtime. 04/28/21  Yes [name], MD  traZODone (DESYREL) 100 MG tablet Take 2 tablets (200 mg total) by mouth at bedtime. 12/01/21  Yes Charlcie Cradle, MD  umeclidinium-vilanterol [facility] ELLIPTA) 62.5-25 MCG/INH AEPB Inhale 1 puff into the lungs daily. 01/25/21  Yes Hunsucker, Bonna Gains, MD  vitamin E 1000 UNIT capsule Take 1,000 Units by mouth daily.   Yes [provider]  Glucagon (GVOKE HYPOPEN 2-PACK) 1 MG/0.2ML SOAJ Inject 1 mg into the skin as needed for low blood sugar. 10/18/21   [name], MD  hyoscyamine (LEVBID) 0.375 MG 12 hr tablet Take 1 tablet (0.375 mg total) by mouth 2 (two) times daily. Patient not taking: Reported on 11/21/2021 10/19/21   Ladene Artist, MD  triamcinolone cream (KENALOG) 0.1 % SMARTSIG:1 Application Topical 2-3 Times Daily Patient not taking: Reported on 12/05/2021 12/04/21   [provider]    Current Outpatient Medications  Medication Sig Dispense Refill   Accu-Chek Softclix Lancets lancets Use as instructed to check blood sugar 4 times per day dx code E10.65 400 each 1   Alcohol Swabs (B-D SINGLE USE SWABS REGULAR) PADS USE FOUR TIMES DAILY 400 each 2   aspirin EC 81 MG tablet Take 81 mg by mouth every morning.     B-D ULTRAFINE III SHORT PEN 31G X 8 MM MISC SMARTSIG:pre-filled pen syringe SUB-Q Daily     Blood Glucose Calibration (ACCU-CHEK AVIVA) SOLN USE AS DIRECTED 1 each 1   Blood  Glucose Monitoring Suppl (ACCU-CHEK AVIVA PLUS) w/Device KIT USE AS DIRECTED 1 kit 0   Clobetasol Propionate 0.05 % shampoo Apply topically.     Continuous Blood Gluc Sensor (FREESTYLE LIBRE 2 SENSOR) MISC Use to check blood sugar daily 2 each 12   donepezil (ARICEPT) 10 MG tablet Take 1 tablet every night 90 tablet 3   fexofenadine (ALLEGRA) 180 MG tablet Take 180 mg by mouth daily.     glucose blood (ACCU-CHEK AVIVA PLUS) test strip TEST BLOOD SUGAR 4 TIMES  DAILY 400 strip 3   insulin detemir (LEVEMIR FLEXTOUCH) 100 UNIT/ML FlexPen Inject 3 units in the morning and 6 units at bedtime (Patient taking differently: Inject 3 units in the morning and 7 units at bedtime) 15 mL 5   insulin lispro (HUMALOG  KWIKPEN) 100 UNIT/ML KwikPen inject 1-3 UNITS tid for large meals 15 mL 1   Insulin Pen Needle 32G X 4 MM MISC Use inject insulin tid 300 each 2   lamoTRIgine (LAMICTAL) 100 MG tablet Take 1 tablet (100 mg total) by mouth 2 (two) times daily. 180 tablet 0   lipase/protease/amylase (CREON) 36000 UNITS CPEP capsule Take 2 capsules by mouth three times a day with meals and 1 capsule by mouth twice daily with snacks 240 capsule 11   lovastatin (MEVACOR) 40 MG tablet TAKE 1 TABLET BY MOUTH AT  BEDTIME 90 tablet 3   Magnesium 500 MG TABS Take 1 tablet by mouth daily.     Multiple Vitamin (MULTIVITAMIN) tablet Take 1 tablet by mouth daily. Reported on 11/22/2015     Omega-3 Fatty Acids (FISH OIL) 1000 MG CAPS Take 1 capsule by mouth daily.     omeprazole (PRILOSEC) 40 MG capsule Take 1 capsule (40 mg total) by mouth in the morning and at bedtime. 180 capsule 0   traZODone (DESYREL) 100 MG tablet Take 2 tablets (200 mg total) by mouth at bedtime. 180 tablet 0   umeclidinium-vilanterol (ANORO ELLIPTA) 62.5-25 MCG/INH AEPB Inhale 1 puff into the lungs daily. 60 each 11   vitamin E 1000 UNIT capsule Take 1,000 Units by mouth daily.     Glucagon (GVOKE HYPOPEN 2-PACK) 1 MG/0.2ML SOAJ Inject 1 mg into the skin as  needed for low blood sugar. 0.4 mL 1   hyoscyamine (LEVBID) 0.375 MG 12 hr tablet Take 1 tablet (0.375 mg total) by mouth 2 (two) times daily. (Patient not taking: Reported on 11/21/2021) 60 tablet 11   triamcinolone cream (KENALOG) 0.1 % SMARTSIG:1 Application Topical 2-3 Times Daily (Patient not taking: Reported on 12/05/2021)     Current Facility-Administered Medications  Medication Dose Route Frequency Provider Last Rate Last Admin   0.9 %  sodium chloride infusion  500 mL Intravenous Once Ladene Artist, MD        Allergies as of 12/05/2021 - Review Complete 12/05/2021  Allergen Reaction Noted   Penicillins Anaphylaxis    Sulfonamide derivatives Anaphylaxis     Family History  Problem Relation Age of Onset   Arthritis Mother    Diverticulosis Mother    Kidney disease Mother    Hyperlipidemia Mother    Arthritis Father    Kidney disease Father    Kidney cancer Father    Bladder Cancer Sister    Colon cancer Sister 78   Celiac disease Sister    Heart attack Brother    Kidney cancer Paternal Aunt    Diabetes Niece    Hyperlipidemia Other    Anxiety disorder Neg Hx    Bipolar disorder Neg Hx    Depression Neg Hx    Breast cancer Neg Hx    Esophageal cancer Neg Hx    Stomach cancer Neg Hx    Rectal cancer Neg Hx     Social History   Socioeconomic History   Marital status: Divorced    Spouse name: Not on file   Number of children: 1   Years of education: 12   Highest education level: High school graduate  Occupational History   Occupation: Disability  Tobacco Use   Smoking status: Every Day    Packs/day: 1.50    Years: 50.00    Pack years: 75.00    Types: Cigarettes   Smokeless tobacco: Never   Tobacco comments:    Reports she has cut back  to a cartoon that now lasts 2 weeks.   Vaping Use   Vaping Use: Former  Substance and Sexual Activity   Alcohol use: Yes    Alcohol/week: 7.0 standard drinks    Types: 7 Cans of beer per week    Comment: 1 beer nightly    Drug use: Not Currently    Types: Cocaine    Comment: past opiate and cocaine abuse   Sexual activity: Not Currently    Birth control/protection: Surgical, Post-menopausal  Other Topics Concern   Not on file  Social History Narrative   Divorced, disabled   Lives with parents   Right handed    Social Determinants of Health   Financial Resource Strain: Not on file  Food Insecurity: Not on file  Transportation Needs: Not on file  Physical Activity: Not on file  Stress: Not on file  Social Connections: Not on file  Intimate Partner Violence: Not on file    Review of Systems:  All systems reviewed an negative except where noted in HPI.  Gen: Denies any fever, chills, sweats, anorexia, fatigue, weakness, malaise, weight loss, and sleep disorder CV: Denies chest pain, angina, palpitations, syncope, orthopnea, PND, peripheral edema, and claudication. Resp: Denies dyspnea at rest, dyspnea with exercise, cough, sputum, wheezing, coughing up blood, and pleurisy. GI: Denies vomiting blood, jaundice, and fecal incontinence.   Denies dysphagia or odynophagia. GU : Denies urinary burning, blood in urine, urinary frequency, urinary hesitancy, nocturnal urination, and urinary incontinence. MS: Denies joint pain, limitation of movement, and swelling, stiffness, low back pain, extremity pain. Denies muscle weakness, cramps, atrophy.  Derm: Denies rash, itching, dry skin, hives, moles, warts, or unhealing ulcers.  Psych: Denies depression, anxiety, memory loss, suicidal ideation, hallucinations, paranoia, and confusion. Heme: Denies bruising, bleeding, and enlarged lymph nodes. Neuro:  Denies any headaches, dizziness, paresthesias. Endo:  Denies any problems with DM, thyroid, adrenal function.   Physical Exam: General:  Alert, well-developed, in NAD Head:  Normocephalic and atraumatic. Eyes:  Sclera clear, no icterus.   Conjunctiva pink. Ears:  Normal auditory acuity. Mouth:  No deformity  or lesions.  Neck:  Supple; no masses . Lungs:  Clear throughout to auscultation.   No wheezes, crackles, or rhonchi. No acute distress. Heart:  Regular rate and rhythm; no murmurs. Abdomen:  Soft, nondistended, nontender. No masses, hepatomegaly. No obvious masses.  Normal bowel .    Rectal:  Deferred   Msk:  Symmetrical without gross deformities.. Pulses:  Normal pulses noted. Extremities:  Without edema. Neurologic:  Alert and  oriented x4;  grossly normal neurologically. Skin:  Intact without significant lesions or rashes. Cervical Nodes:  No significant cervical adenopathy. Psych:  Alert and cooperative. Normal mood and affect.   Impression / Plan:   Diarrhea, weight loss, personal history of adenomatous colon polyps for colonoscopy.  Pricilla Riffle. Fuller Plan  12/05/2021, 2:07 PM See Shea Evans, [facility], to contact our on call provider

## 2021-12-05 NOTE — Progress Notes (Signed)
Report given to PACU, vss 

## 2021-12-05 NOTE — Progress Notes (Signed)
Updated medical record.

## 2021-12-05 NOTE — Patient Instructions (Addendum)
Await pathology results.  Return to GI office in about 6 weeks for follow-up - call office to schedule an appointment    YOU HAD AN ENDOSCOPIC PROCEDURE TODAY AT [facility]:   Refer to the procedure report that was given to you for any specific questions about what was found during the examination.  If the procedure report does not answer your questions, please call your gastroenterologist to clarify.  If you requested that your care partner not be given the details of your procedure findings, then the procedure report has been included in a sealed envelope for you to review at your convenience later.  YOU SHOULD EXPECT: Some feelings of bloating in the abdomen. Passage of more gas than usual.  Walking can help get rid of the air that was put into your GI tract during the procedure and reduce the bloating. If you had a lower endoscopy (such as a colonoscopy or flexible sigmoidoscopy) you may notice spotting of blood in your stool or on the toilet paper. If you underwent a bowel prep for your procedure, you may not have a normal bowel movement for a few days.  Please Note:  You might notice some irritation and congestion in your nose or some drainage.  This is from the oxygen used during your procedure.  There is no need for concern and it should clear up in a day or so.  SYMPTOMS TO REPORT IMMEDIATELY:  Following lower endoscopy (colonoscopy or flexible sigmoidoscopy):  Excessive amounts of blood in the stool  Significant tenderness or worsening of abdominal pains  Swelling of the abdomen that is new, acute  Fever of 100F or higher  For urgent or emergent issues, a gastroenterologist can be reached at any hour by calling [phone]. Do not use MyChart messaging for urgent concerns.    DIET:  We do recommend a small meal at first, but then you may proceed to your regular diet.  Drink plenty of fluids but you should avoid alcoholic beverages for 24 hours.  ACTIVITY:  You  should plan to take it easy for the rest of today and you should NOT DRIVE or use heavy machinery until tomorrow (because of the sedation medicines used during the test).    FOLLOW UP: Our staff will call the number listed on your records 24-72 hours following your procedure to check on you and address any questions or concerns that you may have regarding the information given to you following your procedure. If we do not reach you, we will leave a message.  We will attempt to reach you two times.  During this call, we will ask if you have developed any symptoms of COVID 19. If you develop any symptoms (ie: fever, flu-like symptoms, shortness of breath, cough etc.) before then, please call [phone].  If you test positive for Covid 19 in the 2 weeks post procedure, please call and report this information to [place].    If any biopsies were taken you will be contacted by phone or by letter within the next 1-3 weeks.  Please call us at [phone] if you have not heard about the biopsies in 3 weeks.    SIGNATURES/CONFIDENTIALITY: You and/or your care partner have signed paperwork which will be entered into your electronic medical record.  These signatures attest to the fact that that the information above on your After Visit Summary has been reviewed and is understood.  Full responsibility of the confidentiality of this discharge information lies with  you and/or your care-partner.

## 2021-12-05 NOTE — Op Note (Signed)
[name] Procedure Date: 12/05/2021 2:01 PM MRN: [id number] Endoscopist: Ladene Artist , MD Age: 66 Referring MD:  Date of Birth: [birth year] Gender: Female Account #: [id number] Procedure:                Colonoscopy Indications:              Clinically significant diarrhea of unexplained                            origin, Weight loss, Personal history of                            adenomatous colon polyps Medicines:                Monitored Anesthesia Care Procedure:                Pre-Anesthesia Assessment:                           - Prior to the procedure, a History and Physical                            was performed, and patient medications and                            allergies were reviewed. The patient's tolerance of                            previous anesthesia was also reviewed. The risks                            and benefits of the procedure and the sedation                            options and risks were discussed with the patient.                            All questions were answered, and informed consent                            was obtained. Prior Anticoagulants: The patient has                            taken no previous anticoagulant or antiplatelet                            agents. ASA Grade Assessment: II - A patient with                            mild systemic disease. After reviewing the risks                            and benefits, the patient was deemed in  satisfactory condition to undergo the procedure.                           After obtaining informed consent, the colonoscope                            was passed under direct vision. Throughout the                            procedure, the patient's blood pressure, pulse, and                            oxygen saturations were monitored continuously. The                            Olympus PCF-[id number] (#[id number]) Colonoscope was                             introduced through the anus and advanced to the the                            cecum, identified by appendiceal orifice and                            ileocecal valve. The ileocecal valve, appendiceal                            orifice, and rectum were photographed. The quality                            of the bowel preparation was excellent. The                            colonoscopy was performed without difficulty. The                            patient tolerated the procedure well. Scope In: 2:16:31 PM Scope Out: 0:26:37 PM Scope Withdrawal Time: 0 hours 16 minutes 54 seconds  Total Procedure Duration: 0 hours 20 minutes 17 seconds  Findings:                 The perianal and digital rectal examinations were                            normal. Several attempts to locate and enter the TI                            were not successful.                           The entire examined colon appeared normal on direct                            and retroflexion views. Random biopsies obtained  throughout. Complications:            No immediate complications. Estimated blood loss:                            None. Estimated Blood Loss:     Estimated blood loss: none. Impression:               - The entire examined colon is normal on direct and                            retroflexion views. Random biopsied obtained. Recommendation:           - Repeat colonoscopy after studies are complete for                            surveillance based on pathology results.                           - Patient has a contact number available for                            emergencies. The signs and symptoms of potential                            delayed complications were discussed with the                            patient. Return to normal activities tomorrow.                            Written discharge instructions were provided to the                             patient.                           - Resume previous diet.                           - Continue present medications.                           - Await pathology results.                           - GI office appt in 6 week. Ladene Artist, MD 12/05/2021 2:40:48 PM This report has been signed electronically.

## 2021-12-05 NOTE — Progress Notes (Signed)
Called to room to assist during endoscopic procedure.  Patient ID and intended procedure confirmed with present staff. Received instructions for my participation in the procedure from the performing physician.  

## 2021-12-06 ENCOUNTER — Telehealth: Payer: Self-pay

## 2021-12-06 ENCOUNTER — Telehealth: Payer: Self-pay | Admitting: *Deleted

## 2021-12-06 NOTE — Telephone Encounter (Signed)
6 week follow up reminder has been mailed to patient.

## 2021-12-06 NOTE — Telephone Encounter (Signed)
Called [phone] and left a message we tried to reach pt for a follow up call. maw

## 2021-12-06 NOTE — Telephone Encounter (Signed)
  Follow up Call-     12/05/2021    1:03 PM  Call back number  Post procedure Call Back phone  # [phone]  Permission to leave phone message Yes     Patient questions:  Do you have a fever, pain , or abdominal swelling? No. Pain Score  0 *  Have you tolerated food without any problems? Yes.    Have you been able to return to your normal activities? Yes.    Do you have any questions about your discharge instructions: Diet   No. Medications  No. Follow up visit  No.  Do you have questions or concerns about your Care? No.  Actions: * If pain score is 4 or above: No action needed, pain <4.

## 2021-12-07 DIAGNOSIS — L932 Other local lupus erythematosus: Secondary | ICD-10-CM | POA: Diagnosis not present

## 2021-12-09 ENCOUNTER — Other Ambulatory Visit: Payer: Self-pay

## 2021-12-09 MED ORDER — BD PEN NEEDLE SHORT U/F 31G X 8 MM MISC: 2 refills | Status: DC

## 2021-12-12 ENCOUNTER — Other Ambulatory Visit (INDEPENDENT_AMBULATORY_CARE_PROVIDER_SITE_OTHER): Payer: Medicare Other

## 2021-12-12 DIAGNOSIS — E1065 Type 1 diabetes mellitus with hyperglycemia: Secondary | ICD-10-CM | POA: Diagnosis not present

## 2021-12-12 LAB — GLUCOSE, RANDOM: Glucose, Bld: 102 mg/dL — ABNORMAL HIGH (ref 70–99)

## 2021-12-12 LAB — HEMOGLOBIN A1C: Hgb A1c MFr Bld: 6.6 % — ABNORMAL HIGH (ref 4.6–6.5)

## 2021-12-13 ENCOUNTER — Encounter: Payer: Self-pay | Admitting: Gastroenterology

## 2021-12-13 ENCOUNTER — Other Ambulatory Visit (HOSPITAL_COMMUNITY): Payer: Self-pay

## 2021-12-15 ENCOUNTER — Ambulatory Visit: Payer: Medicare Other | Admitting: Endocrinology

## 2021-12-15 ENCOUNTER — Encounter: Payer: Self-pay | Admitting: Endocrinology

## 2021-12-15 VITALS — BP 150/68 | HR 74 | Ht 63.0 in | Wt 102.8 lb

## 2021-12-15 DIAGNOSIS — E1065 Type 1 diabetes mellitus with hyperglycemia: Secondary | ICD-10-CM

## 2021-12-15 DIAGNOSIS — E1043 Type 1 diabetes mellitus with diabetic autonomic (poly)neuropathy: Secondary | ICD-10-CM | POA: Diagnosis not present

## 2021-12-15 DIAGNOSIS — E108 Type 1 diabetes mellitus with unspecified complications: Secondary | ICD-10-CM | POA: Diagnosis not present

## 2021-12-15 NOTE — Patient Instructions (Signed)
Do not take Humalog with Glucerna

## 2021-12-15 NOTE — Progress Notes (Signed)
Patient ID: Leslie Lane, female   DOB: 1956-05-05, 66 y.o.   MRN: [id number]  Reason for Appointment: Endocrinology follow-up   History of Present Illness    Diagnosis: Type 1 DIABETES MELITUS, diagnosed 1967      She has had labile blood sugar control over the last several years even though A1c has been usually around 7% She has had less lability and hypoglycemia with taking b.i.d. Lantus compared once a day She has been very sensitive to fast acting insulin and frequently does not require mealtime coverage She cannot tolerate Toujeo because of reported episode of headache, bloating and nausea with the first dose  RECENT history:  Insulin regimen: LEVEMIR 3 units in the morning and 6 at 11 pm HUMALOG 1-2 units up to 3 times a day  Current blood sugar patterns, control and problems identified:  Her A1c is about the same at 6.6  She has reportedly less blood sugars below normal although the freestyle libre still indicated periodically having low sugars However her fingersticks are only below 65 twice recently She may sometimes take Humalog even with not eating a lot of carbohydrate Generally is taking at least 1 unit with every meal She has been told not to take any Humalog since she is eating Because of the need to avoid hypoglycemia postprandially but she still sometimes will take 1 unit, she claims she is eating too much Again with some of her snacking and late at night Blood sugars are significantly high on 6/5 and 6/6 because she was told not to take her insulin for colonoscopy prep   Mealtimes: Breakfast 8-8.30 am, lunch 12 noon , dinner 5 pm  Hypoglycemia: Symptoms may be absent with low blood sugars and does not have early warning symptoms. Gets confused and she frequently depends on her mother to recognize low sugars and treat them.  Her mother knows how to give Glucagon injection  She will treat her low blood sugars usually with juice, does carry glucose  tablets when not at home   Blood sugar patterns as follows  Generally blood sugars are the lowest before dinner and HIGHEST around midnight POSTPRANDIAL hyperglycemia is generally not present with most postprandial readings within the 180 target range May have HYPOGLYCEMIA at times following an episode of hypoglycemia OVERNIGHT blood sugars are mildly increased at about 165 average at midnight and then gradually decreased at a variable rate into the morning However hypoglycemia overnight has occurred only on 2 occasions and blood sugars have been normal or high the last 2 nights HYPOGLYCEMIA is most often when the late afternoon and early evenings but less frequently in the last 4 to 5 days Patient thinks that her freestyle libre sensor may not be always accurate and fingerstick readings indicate the lowest blood sugar she has documented recently is only 61 Significant variability is present and blood sugars are reportedly higher more often in the first week compared to the second  CGM use % of time   2-week average/GV 141/49  Time in range       64%  % Time Above 180 14+9  % Time above 250   % Time Below 70 11+2   PRE-MEAL Fasting Lunch Dinner Bedtime Overall  Glucose range:       Mean/median: 143  102 158 140   POST-MEAL PC Breakfast PC Lunch PC Dinner  Glucose range:     Mean/median: 146 148 126   Previously:  PRE-MEAL Fasting Lunch Dinner Bedtime Overall  Glucose range: 53-274 36-188 36-202 30-130   Mean/median:     ?    Previous freestyle libre data:  CGM use % of time   2-week average/GV 165  Time in range    58    %  % Time Above 180 27+2  % Time above 250   % Time Below 70 3     PRE-MEAL Fasting Lunch Dinner Bedtime Overall  Glucose range:       Averages: 134 155 139 229    POST-MEAL PC Breakfast PC Lunch PC Dinner  Glucose range:     Averages: 188 137 152    Wt Readings from Last 3 Encounters:  12/15/21 102 lb 12.8 oz (46.6 kg)  12/05/21 101 lb (45.8  kg)  10/19/21 101 lb 6 oz (46 kg)    Lab Results  Component Value Date   HGBA1C 6.6 (H) 12/12/2021   HGBA1C 6.5 09/06/2021   HGBA1C 6.6 (A) 06/09/2021   Lab Results  Component Value Date   MICROALBUR 2.4 (H) 06/07/2021   LDLCALC 81 06/07/2021   CREATININE 0.77 09/06/2021        Allergies as of 12/15/2021       Reactions   Penicillins Anaphylaxis   Sulfonamide Derivatives Anaphylaxis        Medication List        Accurate as of December 15, 2021  2:00 PM. If you have any questions, ask your nurse or doctor.          Accu-Chek Aviva Plus test strip Generic drug: glucose blood TEST BLOOD SUGAR 4 TIMES  DAILY   Accu-Chek Aviva Plus w/Device Kit USE AS DIRECTED   Accu-Chek Aviva Soln USE AS DIRECTED   Accu-Chek Softclix Lancets lancets Use as instructed to check blood sugar 4 times per day dx code E10.65   Anoro Ellipta 62.5-25 MCG/ACT Aepb Generic drug: umeclidinium-vilanterol Inhale 1 puff into the lungs daily.   aspirin EC 81 MG tablet Take 81 mg by mouth every morning.   B-D SINGLE USE SWABS REGULAR Pads USE FOUR TIMES DAILY   Clobetasol Propionate 0.05 % shampoo Apply topically.   donepezil 10 MG tablet Commonly known as: ARICEPT Take 1 tablet every night   fexofenadine 180 MG tablet Commonly known as: ALLEGRA Take 180 mg by mouth daily.   Fish Oil 1000 MG Caps Take 1 capsule by mouth daily.   FreeStyle Libre 2 Sensor Misc Use to check blood sugar daily   Gvoke HypoPen 2-Pack 1 MG/0.2ML Soaj Generic drug: Glucagon Inject 1 mg into the skin as needed for low blood sugar.   hyoscyamine 0.375 MG 12 hr tablet Commonly known as: Levbid Take 1 tablet (0.375 mg total) by mouth 2 (two) times daily.   insulin lispro 100 UNIT/ML KwikPen Commonly known as: HumaLOG KwikPen inject 1-3 UNITS tid for large meals   Insulin Pen Needle 32G X 4 MM Misc Use inject insulin tid   B-D ULTRAFINE III SHORT PEN 31G X 8 MM Misc Generic drug: Insulin Pen  Needle SMARTSIG:pre-filled pen syringe SUB-Q Daily   lamoTRIgine 100 MG tablet Commonly known as: LAMICTAL Take 1 tablet (100 mg total) by mouth 2 (two) times daily.   Levemir FlexTouch 100 UNIT/ML FlexPen Generic drug: insulin detemir Inject 3 units in the morning and 6 units at bedtime What changed: additional instructions   lipase/protease/amylase 36000 UNITS Cpep capsule Commonly known as: Creon Take 2 capsules by mouth three times a day with meals and 1 capsule by  mouth twice daily with snacks   lovastatin 40 MG tablet Commonly known as: MEVACOR TAKE 1 TABLET BY MOUTH AT  BEDTIME   Magnesium 500 MG Tabs Take 1 tablet by mouth daily.   multivitamin tablet Take 1 tablet by mouth daily. Reported on 11/22/2015   omeprazole 40 MG capsule Commonly known as: PRILOSEC Take 1 capsule (40 mg total) by mouth in the morning and at bedtime.   traZODone 100 MG tablet Commonly known as: DESYREL Take 2 tablets (200 mg total) by mouth at bedtime.   triamcinolone cream 0.1 % Commonly known as: KENALOG SMARTSIG:1 Application Topical 2-3 Times Daily   vitamin E 1000 UNIT capsule Take 1,000 Units by mouth daily.        Allergies:  Allergies  Allergen Reactions   Penicillins Anaphylaxis   Sulfonamide Derivatives Anaphylaxis    Past Medical History:  Diagnosis Date   Abdominal pain 02/28/2016   Abnormal CT scan, bladder    Noted 07/28/13 CT - s/p uro eval 08/2013 [place] -    Acquired trigger finger of left ring finger 02/18/2019   Injected February 18, 2019 and March 2022   Allergic rhinitis    Anemia    Arthritis    Bipolar disorder 07/05/2010   Cataract    Chronic lower back pain 07/05/2010   Chronic neck pain    Diabetic gastroparesis associated with type 1 diabetes mellitus 10/24/2016   Fibromyalgia    Generalized anxiety disorder 04/27/2014   GERD (gastroesophageal reflux disease) 07/05/2010   Hepatitis B virus infection 07/05/2010   History of substance abuse     opiates; cocaine   Hypoglycemia unawareness associated with type 1 diabetes mellitus 11/23/2020   Insomnia 04/13/2015   Major depressive disorder 07/28/2014   Migraine headache 07/05/2010   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  It hasn't happened in a long time."   Smokers' cough 07/05/2010   Tubular adenoma of colon 2018   Type 1 diabetes mellitus with complication 16/96/7893   WBC decreased 03/27/2017    Past Surgical History:  Procedure Laterality Date   CESAREAN SECTION     COLONOSCOPY     TUBAL LIGATION     UPPER GASTROINTESTINAL ENDOSCOPY      Family History  Problem Relation Age of Onset   Arthritis Mother    Diverticulosis Mother    Kidney disease Mother    Hyperlipidemia Mother    Arthritis Father    Kidney disease Father    Kidney cancer Father    Bladder Cancer Sister    Colon cancer Sister 13   Celiac disease Sister    Heart attack Brother    Kidney cancer Paternal Aunt    Diabetes Niece    Hyperlipidemia Other    Anxiety disorder Neg Hx    Bipolar disorder Neg Hx    Depression Neg Hx    Breast cancer Neg Hx    Esophageal cancer Neg Hx    Stomach cancer Neg Hx    Rectal cancer Neg Hx     Social History:  reports that she has been smoking cigarettes. She has a 75.00 pack-year smoking history. She has never used smokeless tobacco. She reports current alcohol use of about 7.0  standard drinks of alcohol per week. She reports that she does not currently use drugs after having used the following drugs: Cocaine.  Review of Systems:   Had gastroparesis treated with Reglan  Also now being followed by gastroenterologist for abdominal pain and pancreatic insufficiency She is still  having some tendency to diarrhea despite taking Creon    NEUROPATHY: She has history of burning in her legs and feet and she thinks that gabapentin did not work and caused her to be sleepy She has been prescribed Cymbalta with some benefit   DEPRESSION: She has had long-standing depression and anxiety on long-term treatment  HYPERLIPIDEMIA: The lipid abnormality consists of elevated LDL   Is  on lovastatin 40 mg with  good control, prescribed by PCP   Lab Results  Component Value Date   CHOL 209 (H) 06/07/2021   HDL 115.00 06/07/2021   LDLCALC 81 06/07/2021   LDLDIRECT 101.7 03/26/2012   TRIG 64.0 06/07/2021   CHOLHDL 2 06/07/2021    Memory difficulties: She was given Aricept by the neurologist, is taking 10 mg    Examination:   BP (!) 150/68   Pulse 74   Ht 5' 3" (1.6 m)   Wt 102 lb 12.8 oz (46.6 kg)   SpO2 96%   BMI 18.21 kg/m   Body mass index is 18.21 kg/m.    ASSESSMENT/ PLAN:   Diabetes type 1 longstanding  See history of present illness for detailed discussion of current insulin, blood sugar patterns, problems identified  A1c has been under 7% and now 6.6  Again her A1c is relatively good However she has inconsistent blood sugar patterns She is still having some high readings related to excessive eating especially late at night but overall hypoglycemia is less Day-to-day management and coverage of meals and avoidance of hypoglycemia was discussed  Recommendations:  She can try to skip her Humalog dose at breakfast unless blood sugars are over 200  Also try to minimize Humalog at dinnertime since blood sugars tend to be low normal Continue cross checking fingersticks with freestyle libre but she can get back on how frequently she does Accu-Cheks now Extra snacks if she is planning to be more active No change in Levemir as yet To call if she starts getting excessive hypoglycemia  She will continue Reglan as needed for nausea  Total visit time including  counseling = 30 minutes  There are no Patient Instructions on file for this visit.      [name] 12/15/2021, 2:00 PM

## 2021-12-16 DIAGNOSIS — E1065 Type 1 diabetes mellitus with hyperglycemia: Secondary | ICD-10-CM | POA: Diagnosis not present

## 2021-12-18 ENCOUNTER — Other Ambulatory Visit: Payer: Self-pay | Admitting: Neurology

## 2021-12-23 ENCOUNTER — Ambulatory Visit: Payer: Medicare Other | Admitting: Internal Medicine

## 2021-12-23 ENCOUNTER — Encounter: Payer: Self-pay | Admitting: Internal Medicine

## 2021-12-23 ENCOUNTER — Ambulatory Visit (INDEPENDENT_AMBULATORY_CARE_PROVIDER_SITE_OTHER): Payer: Medicare Other | Admitting: Internal Medicine

## 2021-12-23 ENCOUNTER — Ambulatory Visit (INDEPENDENT_AMBULATORY_CARE_PROVIDER_SITE_OTHER): Payer: Medicare Other

## 2021-12-23 VITALS — BP 122/72 | HR 70 | Resp 18 | Ht 63.0 in | Wt 102.4 lb

## 2021-12-23 DIAGNOSIS — I700 Atherosclerosis of aorta: Secondary | ICD-10-CM

## 2021-12-23 DIAGNOSIS — F17210 Nicotine dependence, cigarettes, uncomplicated: Secondary | ICD-10-CM | POA: Diagnosis not present

## 2021-12-23 DIAGNOSIS — J410 Simple chronic bronchitis: Secondary | ICD-10-CM | POA: Diagnosis not present

## 2021-12-23 DIAGNOSIS — E440 Moderate protein-calorie malnutrition: Secondary | ICD-10-CM

## 2021-12-23 DIAGNOSIS — M25552 Pain in left hip: Secondary | ICD-10-CM | POA: Diagnosis not present

## 2021-12-23 NOTE — Assessment & Plan Note (Signed)
She is taking aspirin and lovastatin 40 mg daily which we will continue. She is very high risk CV disease given smoking, type 1 diabetes.

## 2021-12-27 ENCOUNTER — Encounter: Payer: Self-pay | Admitting: Endocrinology

## 2021-12-30 NOTE — Assessment & Plan Note (Signed)
Time spent counseling about tobacco usage: 5 minutes. I have asked about smoking and is smoking less than usual. The patient is advised to quit. The patient is not willing to quit. They would like to try to quit in the next 6 months. We will follow up with them in 6 months.

## 2022-01-03 ENCOUNTER — Other Ambulatory Visit: Payer: Self-pay | Admitting: Pulmonary Disease

## 2022-01-09 ENCOUNTER — Telehealth (HOSPITAL_COMMUNITY): Payer: Self-pay

## 2022-01-09 NOTE — Telephone Encounter (Signed)
Medication mangement - callled pt back, after she left a message, to inform she had orders for Lamictal and Trazodone sent into her [facility] on 12/01/21 for 90 day orders.  Pt reported filling the Lamictal and will call pharmacy to fill the Trazodone.  Patient to call back if any problems filling medication when due.

## 2022-01-11 ENCOUNTER — Other Ambulatory Visit: Payer: Self-pay | Admitting: Internal Medicine

## 2022-01-16 DIAGNOSIS — E1065 Type 1 diabetes mellitus with hyperglycemia: Secondary | ICD-10-CM | POA: Diagnosis not present

## 2022-01-17 ENCOUNTER — Encounter: Payer: Self-pay | Admitting: Gastroenterology

## 2022-01-17 ENCOUNTER — Ambulatory Visit: Payer: Medicare Other | Admitting: Gastroenterology

## 2022-01-17 VITALS — BP 124/60 | HR 84 | Ht 63.75 in | Wt 104.4 lb

## 2022-01-17 DIAGNOSIS — K8689 Other specified diseases of pancreas: Secondary | ICD-10-CM | POA: Diagnosis not present

## 2022-01-17 DIAGNOSIS — R197 Diarrhea, unspecified: Secondary | ICD-10-CM

## 2022-01-17 NOTE — Progress Notes (Signed)
    Assessment     Diarrhea, malnutrition due pancreatic insufficiency  Suspected IBS-D GERD   Recommendations    Continue Creon 36,000 units 2 p.o. with meals and 1 p.o. with snacks Continue hyoscyamine 0.375 mg p.o. twice daily as needed Omeprazole 40 mg p.o. twice daily and follow antireflux measures REV in 1 year   HPI    This is a 66 year old female with diarrhea, malmutrition and inability to gain weight.  Her symptoms have improved. Her evaluation has included CT AP, colonoscopy, stool studies, fecal calprotectin, fecal elastase, tTG, IgA.  Since she has been taking her Creon regularly as prescribed with meals and snacks her diarrhea has improved.  Colonoscopy June 2023 - The entire examined colon is normal on direct and retroflexion views. Random biopsied obtained. (Unremarkable)    Labs / Imaging       Latest Ref Rng & Units 06/07/2021    3:20 PM 09/16/2020   10:36 AM 06/10/2020    1:43 PM  Hepatic Function  Total Protein 6.0 - 8.3 g/dL 7.2  6.9  6.9   Albumin 3.5 - 5.2 g/dL 4.7  4.2  4.3   AST 0 - 37 U/L $Remo'21  25  25   'cEWGJ$ ALT 0 - 35 U/L $Remo'16  19  19   'HXmfg$ Alk Phosphatase 39 - 117 U/L 46  46  44   Total Bilirubin 0.2 - 1.2 mg/dL 0.4  0.5  0.5        Latest Ref Rng & Units 06/10/2020    1:43 PM 06/04/2018   10:39 AM 03/20/2017   11:09 AM  CBC  WBC 4.0 - 10.5 K/uL 4.4  3.8  3.3   Hemoglobin 12.0 - 15.0 g/dL 12.0  12.9  13.8   Hematocrit 36.0 - 46.0 % 36.7  38.5  41.5   Platelets 150.0 - 400.0 K/uL 237.0  259.0  213.0    CT AP 08/02/2021 1. No findings in the abdomen or pelvis to explain abdominal pain. 2.  Aortic Atherosclerosis   Current Medications, Allergies, Past Medical History, Past Surgical History, Family History and Social History were reviewed in [employer] record.   Physical Exam: General: Well developed, well nourished, thin, no acute distress Head: Normocephalic and atraumatic Eyes: Sclerae anicteric, EOMI Ears: Normal  auditory acuity Mouth: Not examined Lungs: Clear throughout to auscultation Heart: Regular rate and rhythm; no murmurs, rubs or bruits Abdomen: Soft, non tender and non distended. No masses, hepatosplenomegaly or hernias noted. Normal Bowel sounds Rectal: Not done Musculoskeletal: Symmetrical with no gross deformities  Pulses:  Normal pulses noted Extremities: No clubbing, cyanosis, edema or deformities noted Neurological: Alert oriented x 4, grossly nonfocal Psychological:  Alert and cooperative. Normal mood and affect   Tyeisha Dinan T. Fuller Plan, MD 01/17/2022, 9:27 AM

## 2022-01-17 NOTE — Patient Instructions (Signed)
Continue Creon.   The [facility] providers would like to encourage you to use [facility] to communicate with providers for non-urgent requests or questions.  Due to long hold times on the telephone, sending your provider a message by [facility] may be a faster and more efficient way to get a response.  Please allow 48 business hours for a response.  Please remember that this is for non-urgent requests.   Thank you for choosing me and [facility].  Pricilla Riffle. Dagoberto Ligas., MD., Marval Regal

## 2022-01-18 ENCOUNTER — Encounter: Payer: Self-pay | Admitting: Physician Assistant

## 2022-01-18 ENCOUNTER — Ambulatory Visit: Payer: Medicare Other | Admitting: Physician Assistant

## 2022-01-18 VITALS — BP 147/61 | HR 76 | Resp 20 | Ht 63.5 in | Wt 105.0 lb

## 2022-01-18 DIAGNOSIS — F0670 Mild neurocognitive disorder due to known physiological condition without behavioral disturbance: Secondary | ICD-10-CM | POA: Diagnosis not present

## 2022-01-18 NOTE — Progress Notes (Signed)
Assessment/Plan:   Mild Cognitive Impairment   Leslie Lane is a very pleasant 66 y.o. RH female with a history of type 1 diabetes mellitus, hyperlipidemia, fibromyalgia, migraines, major depressive disorder, GAD, bipolar disorder, cutaneous lupus and with a history of mild cognitive impairment last seen on July 21, 2021 presenting today in follow-up for evaluation of memory. She is on donepezil 10 mg daily, tolerating well. She is stable from the cognitive standpoint.  However, the patient continues to smoke, and she is on multiple psych medications, including trazodone 200 mg at night.  She is not participating in any activities outside of the house, and all these factors may contribute to her memory difficulties.   Recommendations:    Continue donepezil 10 mg daily Side effects were discussed Follow up in  6 months. Continue to monitor driving  She has been instructed to talk to her psychiatry, regarding psychiatric polypharmacy, which may affect her memory. Discussed with the patient the importance of discontinuing smoking Recommend increasing activity outside of the house, including volunteering.  Her   Case discussed with Dr. Delice Lesch who agrees with the plan     Subjective:   This patient is here alone.  Previous records as well as any outside records available were reviewed prior to todays visit.  Patient was last seen at our office on 07/21/21. Last MMSE was 22/30    Any changes in memory since last visit?  Patient denies any changes, she continues to have issues with attention "I do not pay attention to anything I do, I am always in a rash, and that I cannot find things, or I cannot remember things ".  She is not very active, she continues to visit people in hospitals and nursing homes, but she is not doing any activities for herself.  However, her grandchild moved from [place] to here, and that gives her an opportunity to interact with her family.  She reports that she  does not have a very good relationship with her daughter. Patient lives with: her mother "who drives me crazy ". repeats oneself?  Endorsed, about the same as before Disoriented when walking into a room?  Patient denies   Leaving objects in unusual places?  Patient denies .  "I lose stuff all the time, such as my keys, but not in unusual places ".   Ambulates  with difficulty?   Patient has chronic left hip pain and diabetic neuropathy, she is on gabapentin.  X-ray of the left hip is negative. Recent falls?  Patient denies   Any head injuries?  Patient denies   History of seizures?   Patient denies   Wandering behavior?  Patient denies   Patient drives?   Only to familiar areas, she has fear of getting lost  Any mood changes such irritability agitation?  She feels more anxious than prior, she is under the care of a psychiatrist, but she has not found adequate therapy yet "they are working progress ".  "Sometimes I cry ".  "I think my mother has a lot to do with my depression".  Hallucinations?  "Sometimes I see a tidy bit but I turn and nothing is there " Paranoia?  Patient  is aware of her belongings and is protective about them, but not true paranoia  Patient reports that she does not sleep very well, despite using trazodone.  Sometimes she takes up to 200 mg of trazodone, with some relief.  She denies any vivid dreams, REM behavior or sleepwalking.  History of sleep apnea?  Patient denies   Any hygiene concerns?  Patient denies   Independent of bathing and dressing?  Endorsed  Does the patient needs help with medications?  Denies Who is in charge of the finances? Patient  is in charge    Any changes in appetite?  Eating more since Creon ("cutaneous lupus ")  Patient have trouble swallowing? Patient denies   Does the patient cook?  Patient denies   Any kitchen accidents such as leaving the stove on? Patient denies   Any headaches?  "Only when I smoke ". Double vision? Patient denies   Any  focal numbness or tingling?  Patient denies   Chronic back pain Patient denies   Unilateral weakness?  Patient denies   Any tremors?  Patient denies   Any history of anosmia?  Patient denies   Any incontinence of urine?  Patient denies, unless she drinks "a lot of water, then I had to go quickly ". Any bowel dysfunction?   Patient denies "no more diarrhea since Creon"  History on Initial Assessment 12/18/2018: This is a 66 year old woman with a history of type I DM, hyperlipidemia, fibromyalgia, migraines, major depressive disorder, GAD, rule out bipolar disorder, presenting for evaluation of memory loss. She is alone in the office with no family to corroborate history. She states memory changes started after she was in the hospital in a diabetic coma at age 33. She states she has not noticed any changes since then, memory has been about the same since then. She initially could not drive a car or remember a movie she had watched. She acquired a guardian to manage her finances. She lives with her mother. She has been told she repeats herself and says the same thing every morning. She used to leave the stove on, but has been more vigilant. She has been driving and has gotten lost driving in unfamiliar roads. She manages her medications independently. She states she does not like math and was not good in school. She has reported her memory concerns to her psychiatrist and reported that she cannot focus on anything. She is easily irritable. No paranoia or hallucinations. Sleep is good with Trazodone. No family history of dementia. No history of significant head injuries. She used to drink heavily when younger, then cut down to a beer every now and then after her diabetic coma.    She has headaches that improve when she is not smoking. She has a history of migraines and has mild bitemporal headaches, taking daily magnesium. She has chronic neck and back pain and neuropathy in her feet. No dizziness, vision  changes, focal weakness, bowel/bladder dysfunction, anosmia, or tremors.      Neurocognitive testing 03/29/21 Dr. Melvyn Novas Briefly, Leslie Lane exhibited very limited testing tolerance and abruptly discontinued the evaluation prematurely. Unfortunately, given the abbreviated nature of testing, several domains were unable to be thoroughly assessed and the current conceptualization is limited by this. Leslie Lane pattern of performance is suggestive of prominent difficulties with basic attention, expressive language, visuospatial abilities, and encoding (i.e., learning) aspects of memory. Overall, remote substance abuse history, as well as chronic and acute psychiatric distress, is very likely exacerbating deficits related to various vascular conditions. Ongoing sleep dysfunction, chronic pain, and frequent headaches would also contribute to dysfunction.   Past Medical History:  Diagnosis Date   Abdominal pain 02/28/2016   Abnormal CT scan, bladder    Noted 07/28/13 CT - s/p uro eval 08/2013 [place] -  Acquired trigger finger of left ring finger 02/18/2019   Injected February 18, 2019 and March 2022   Allergic rhinitis    Anemia    Arthritis    Bipolar disorder 07/05/2010   Cataract    Chronic lower back pain 07/05/2010   Chronic neck pain    Diabetic gastroparesis associated with type 1 diabetes mellitus 10/24/2016   Fibromyalgia    Generalized anxiety disorder 04/27/2014   GERD (gastroesophageal reflux disease) 07/05/2010   Hepatitis B virus infection 07/05/2010   History of substance abuse    opiates; cocaine   Hypoglycemia unawareness associated with type 1 diabetes mellitus 11/23/2020   Insomnia 04/13/2015   Major depressive disorder 07/28/2014   Migraine headache 07/05/2010   Mild neurocognitive disorder due to multiple etiologies 03/15/2021   Mixed diabetic hyperlipidemia associated with type 1 diabetes mellitus 07/05/2010   Neuropathy    Protein calorie malnutrition 06/01/2017    Right shoulder pain 02/18/2018   Seizures    pt states, "if my blood sugar drops to the 20's, I convulse.  It hasn't happened in a long time."   Smokers' cough 07/05/2010   Tubular adenoma of colon 2018   Type 1 diabetes mellitus with complication 16/04/9603   WBC decreased 03/27/2017     Past Surgical History:  Procedure Laterality Date   CESAREAN SECTION     COLONOSCOPY     TUBAL LIGATION     UPPER GASTROINTESTINAL ENDOSCOPY       PREVIOUS MEDICATIONS:   CURRENT MEDICATIONS:  Outpatient Encounter Medications as of 01/18/2022  Medication Sig   Accu-Chek Softclix Lancets lancets Use as instructed to check blood sugar 4 times per day dx code E10.65   Alcohol Swabs (B-D SINGLE USE SWABS REGULAR) PADS USE FOUR TIMES DAILY   aspirin EC 81 MG tablet Take 81 mg by mouth every morning.   B-D ULTRAFINE III SHORT PEN 31G X 8 MM MISC SMARTSIG:pre-filled pen syringe SUB-Q Daily   Blood Glucose Calibration (ACCU-CHEK AVIVA) SOLN USE AS DIRECTED   Blood Glucose Monitoring Suppl (ACCU-CHEK AVIVA PLUS) w/Device KIT USE AS DIRECTED   Clobetasol Propionate 0.05 % shampoo Apply topically.   Continuous Blood Gluc Sensor (FREESTYLE LIBRE 2 SENSOR) MISC Use to check blood sugar daily   donepezil (ARICEPT) 10 MG tablet TAKE 1 TABLET BY MOUTH AT  NIGHT   doxycycline (VIBRAMYCIN) 100 MG capsule Take 100 mg by mouth daily.   fexofenadine (ALLEGRA) 180 MG tablet Take 180 mg by mouth daily.   Glucagon (GVOKE HYPOPEN 2-PACK) 1 MG/0.2ML SOAJ Inject 1 mg into the skin as needed for low blood sugar.   glucose blood (ACCU-CHEK AVIVA PLUS) test strip TEST BLOOD SUGAR 4 TIMES  DAILY   hyoscyamine (LEVBID) 0.375 MG 12 hr tablet Take 1 tablet (0.375 mg total) by mouth 2 (two) times daily.   insulin detemir (LEVEMIR FLEXTOUCH) 100 UNIT/ML FlexPen Inject 3 units in the morning and 6 units at bedtime (Patient taking differently: Inject 3 units in the morning and 7 units at bedtime)   insulin lispro (HUMALOG KWIKPEN)  100 UNIT/ML KwikPen inject 1-3 UNITS tid for large meals   Insulin Pen Needle 32G X 4 MM MISC Use inject insulin tid   lamoTRIgine (LAMICTAL) 100 MG tablet Take 1 tablet (100 mg total) by mouth 2 (two) times daily.   lipase/protease/amylase (CREON) 36000 UNITS CPEP capsule Take 2 capsules by mouth three times a day with meals and 1 capsule by mouth twice daily with snacks   lovastatin (  MEVACOR) 40 MG tablet TAKE 1 TABLET BY MOUTH AT  BEDTIME   Magnesium 500 MG TABS Take 1 tablet by mouth daily.   Multiple Vitamin (MULTIVITAMIN) tablet Take 1 tablet by mouth daily. Reported on 11/22/2015   Omega-3 Fatty Acids (FISH OIL) 1000 MG CAPS Take 1 capsule by mouth daily.   omeprazole (PRILOSEC) 40 MG capsule TAKE 1 CAPSULE(40 MG) BY MOUTH IN THE MORNING AND AT BEDTIME   traZODone (DESYREL) 100 MG tablet Take 2 tablets (200 mg total) by mouth at bedtime.   triamcinolone cream (KENALOG) 0.1 %    umeclidinium-vilanterol (ANORO ELLIPTA) 62.5-25 MCG/ACT AEPB INHALE 1 PUFF INTO THE LUNGS DAILY   vitamin E 1000 UNIT capsule Take 1,000 Units by mouth daily.   No facility-administered encounter medications on file as of 01/18/2022.     Objective:     PHYSICAL EXAMINATION:    VITALS:   Vitals:   01/18/22 1428  BP: (!) 147/61  Pulse: 76  Resp: 20  SpO2: 95%  Weight: 105 lb (47.6 kg)  Height: 5' 3.5" (1.613 m)    GEN:  The patient appears stated age and is in NAD. HEENT:  Normocephalic, atraumatic.   Neurological examination:  General: NAD, well-groomed, appears stated age.  Anxious appearing Orientation: The patient is alert. Oriented to person, place and date Cranial nerves: There is good facial symmetry.The speech is fluent and clear. No aphasia or dysarthria. Fund of knowledge is appropriate. Recent memory impaired and remote memory is normal.  Attention and concentration are normal.  Able to name objects and repeat phrases.  Hearing is intact to conversational tone.    Sensation: Sensation is  intact to light touch throughout Motor: Strength is at least antigravity x4. Tremors: none  DTR's 2/4 in UE/LE      07/30/2019    1:00 PM 12/18/2018   11:00 AM  Montreal Cognitive Assessment   Visuospatial/ Executive (0/5)  1  Naming (0/3)  3  Attention: Read list of digits (0/2) 1 1  Attention: Read list of letters (0/1) 1 1  Attention: Serial 7 subtraction starting at 100 (0/3) 1 2  Language: Repeat phrase (0/2) 0 0  Language : Fluency (0/1) 0 0  Abstraction (0/2) 2 0  Delayed Recall (0/5) 4 0  Orientation (0/6) 6 5  Total  13  Adjusted Score (based on education)  14       12/03/2020    3:00 PM 05/30/2017   11:57 AM  MMSE - Mini Mental State Exam  Orientation to time 5 5  Orientation to Place 5 5  Registration 3 3  Attention/ Calculation 0 3  Recall 0 2  Language- name 2 objects 2 2  Language- repeat 1 1  Language- follow 3 step command 3 3  Language- read & follow direction 1 1  Write a sentence 1 1  Copy design 1 1  Total score 22 27       Movement examination: Tone: There is normal tone in the UE/LE Abnormal movements:  no tremor.  No myoclonus.  No asterixis.   Coordination:  There is no decremation with RAM's. Normal finger to nose  Gait and Station: The patient has no difficulty arising out of a deep-seated chair without the use of the hands. The patient's stride length is good.  Gait is cautious and narrow.   Thank you for allowing [place] the opportunity to participate in the care of this nice patient. Please do not hesitate to contact us for  any questions or concerns.   Total time spent on today's visit was 31 minutes dedicated to this patient today, preparing to see patient, examining the patient, ordering tests and/or medications and counseling the patient, documenting clinical information in the EHR or other health record, independently interpreting results and communicating results to the patient/family, discussing treatment and goals, answering patient's  questions and coordinating care.  Cc:  [name], MD  Sharene Butters 01/18/2022 3:16 PM   Cc:  [name], MD Sharene Butters, PA-C

## 2022-01-18 NOTE — Patient Instructions (Signed)
It was a pleasure to see you today at our office.   Recommendations:  Meds: Continue  Donepezil 10 mg: take 1 tablet every night Follow up in 6 months  Follow up with psychiatry, GI, Primary doctors  Stop smoking  Get involved with activities outside of the house   [place]: 1. Continue to exercise (Recommend 30 minutes of walking everyday, or 3 hours every week) 2. Increase social interactions - continue going to [place] and enjoy social gatherings with friends and family 3. Eat healthy, avoid fried foods and eat more fruits and vegetables 4. Maintain adequate blood pressure, blood sugar, and blood cholesterol level. Reducing the risk of stroke and cardiovascular disease also helps promoting better memory. 5. Avoid stressful situations. Live a simple life and avoid aggravations. Organize your time and prepare for the next day in anticipation. 6. Sleep well, avoid any interruptions of sleep and avoid any distractions in the bedroom that may interfere with adequate sleep quality 7. Avoid sugar, avoid sweets as there is a strong link between excessive sugar intake, diabetes, and cognitive impairment We discussed the Mediterranean diet, which has been shown to help patients reduce the risk of progressive memory disorders and reduces cardiovascular risk. This includes eating fish, eat fruits and green leafy vegetables, nuts like almonds and hazelnuts, walnuts, and also use olive oil. Avoid fast foods and fried foods as much as possible. Avoid sweets and sugar as sugar use has been linked to worsening of memory function.  There is always a concern of gradual progression of memory problems. If this is the case, then we may need to adjust level of care according to patient needs. Support, both to the patient and caregiver, should then be put into place.      You have been referred for a neuropsychological evaluation (i.e., evaluation of memory and  thinking abilities). Please bring someone with you to this appointment if possible, as it is helpful for the doctor to hear from both you and another adult who knows you well. Please bring eyeglasses and hearing aids if you wear them.    The evaluation will take approximately 3 hours and has two parts:   The first part is a clinical interview with the neuropsychologist (Dr. Melvyn Novas or Dr. Nicole Kindred). During the interview, the neuropsychologist will speak with you and the individual you brought to the appointment.    The second part of the evaluation is testing with the doctor's technician Hinton Dyer or Maudie Mercury). During the testing, the technician will ask you to remember different types of material, solve problems, and answer some questionnaires. Your family member will not be present for this portion of the evaluation.   Please note: We must reserve several hours of the neuropsychologist's time and the psychometrician's time for your evaluation appointment. As such, there is a No-Show fee of $100. If you are unable to attend any of your appointments, please contact our office as soon as possible to reschedule.    FALL PRECAUTIONS: Be cautious when walking. Scan the area for obstacles that may increase the risk of trips and falls. When getting up in the mornings, sit up at the edge of the bed for a few minutes before getting out of bed. Consider elevating the bed at the head end to avoid drop of blood pressure when getting up. Walk always in a well-lit room (use night lights in the walls). Avoid area rugs or power cords from appliances in the middle of the  walkways. Use a walker or a cane if necessary and consider physical therapy for balance exercise. Get your eyesight checked regularly.  FINANCIAL OVERSIGHT: Supervision, especially oversight when making financial decisions or transactions is also recommended.  HOME SAFETY: Consider the safety of the kitchen when operating appliances like stoves, microwave oven,  and blender. Consider having supervision and share cooking responsibilities until no longer able to participate in those. Accidents with firearms and other hazards in the house should be identified and addressed as well.   ABILITY TO BE LEFT ALONE: If patient is unable to contact 911 operator, consider using LifeLine, or when the need is there, arrange for someone to stay with patients. Smoking is a fire hazard, consider supervision or cessation. Risk of wandering should be assessed by caregiver and if detected at any point, supervision and safe proof recommendations should be instituted.  MEDICATION SUPERVISION: Inability to self-administer medication needs to be constantly addressed. Implement a mechanism to ensure safe administration of the medications.   DRIVING: Regarding driving, in patients with progressive memory problems, driving will be impaired. We advise to have someone else do the driving if trouble finding directions or if minor accidents are reported. Independent driving assessment is available to determine safety of driving.   If you are interested in the driving assessment, you can contact the following:  [employer] in [place]  [facility] [place] [phone] or [phone]    [place] refers to food and lifestyle choices that are based on the traditions of countries located on the [employer]. This way of eating has been shown to help prevent certain conditions and improve outcomes for people who have chronic diseases, like kidney disease and heart disease. What are tips for following this plan? Lifestyle  Cook and eat meals together with your family, when possible. Drink enough fluid to keep your urine clear or pale yellow. Be physically active every day. This includes: Aerobic exercise like running or swimming. Leisure  activities like gardening, walking, or housework. Get 7-8 hours of sleep each night. If recommended by your health care provider, drink red wine in moderation. This means 1 glass a day for nonpregnant women and 2 glasses a day for men. A glass of wine equals 5 oz (150 mL). Reading food labels  Check the serving size of packaged foods. For foods such as rice and pasta, the serving size refers to the amount of cooked product, not dry. Check the total fat in packaged foods. Avoid foods that have saturated fat or trans fats. Check the ingredients list for added sugars, such as corn syrup. Shopping  At the grocery store, buy most of your food from the areas near the walls of the store. This includes: Fresh fruits and vegetables (produce). Grains, beans, nuts, and seeds. Some of these may be available in unpackaged forms or large amounts (in bulk). Fresh seafood. Poultry and eggs. Low-fat dairy products. Buy whole ingredients instead of prepackaged foods. Buy fresh fruits and vegetables in-season from local farmers markets. Buy frozen fruits and vegetables in resealable bags. If you do not have access to quality fresh seafood, buy precooked frozen shrimp or canned fish, such as tuna, salmon, or sardines. Buy small amounts of raw or cooked vegetables, salads, or olives from the deli or salad bar at your store. Stock your pantry so you always have certain foods on hand, such as olive oil, canned tuna, canned tomatoes,  rice, pasta, and beans. Cooking  Cook foods with extra-virgin olive oil instead of using butter or other vegetable oils. Have meat as a side dish, and have vegetables or grains as your main dish. This means having meat in small portions or adding small amounts of meat to foods like pasta or stew. Use beans or vegetables instead of meat in common dishes like chili or lasagna. Experiment with different cooking methods. Try roasting or broiling vegetables instead of steaming or sauteing  them. Add frozen vegetables to soups, stews, pasta, or rice. Add nuts or seeds for added healthy fat at each meal. You can add these to yogurt, salads, or vegetable dishes. Marinate fish or vegetables using olive oil, lemon juice, garlic, and fresh herbs. Meal planning  Plan to eat 1 vegetarian meal one day each week. Try to work up to 2 vegetarian meals, if possible. Eat seafood 2 or more times a week. Have healthy snacks readily available, such as: Vegetable sticks with hummus. Greek yogurt. Fruit and nut trail mix. Eat balanced meals throughout the week. This includes: Fruit: 2-3 servings a day Vegetables: 4-5 servings a day Low-fat dairy: 2 servings a day Fish, poultry, or lean meat: 1 serving a day Beans and legumes: 2 or more servings a week Nuts and seeds: 1-2 servings a day Whole grains: 6-8 servings a day Extra-virgin olive oil: 3-4 servings a day Limit red meat and sweets to only a few servings a month What are my food choices? Mediterranean diet Recommended Grains: Whole-grain pasta. Brown rice. Bulgar wheat. Polenta. Couscous. Whole-wheat bread. [place]. Vegetables: Artichokes. Beets. Broccoli. Cabbage. Carrots. Eggplant. Green beans. Chard. Kale. Spinach. Onions. Leeks. Peas. Squash. Tomatoes. Peppers. Radishes. Fruits: Apples. Apricots. Avocado. Berries. Bananas. Cherries. Dates. Figs. Grapes. Lemons. Melon. Oranges. Peaches. Plums. Pomegranate. Meats and other protein foods: Beans. Almonds. Sunflower seeds. Pine nuts. Peanuts. [place]. Salmon. Scallops. Shrimp. [place]. Tilapia. Clams. Oysters. Eggs. Dairy: Low-fat milk. Cheese. Greek yogurt. Beverages: Water. Red wine. Herbal tea. Fats and oils: Extra virgin olive oil. Avocado oil. Grape seed oil. Sweets and desserts: [place] yogurt with honey. Baked apples. Poached pears. Trail mix. Seasoning and other foods: Basil. Cilantro. Coriander. Cumin. Mint. Parsley. Sage. Rosemary. Tarragon. Garlic. Oregano. Thyme. Pepper.  Balsalmic vinegar. Tahini. Hummus. Tomato sauce. Olives. Mushrooms. Limit these Grains: Prepackaged pasta or rice dishes. Prepackaged cereal with added sugar. Vegetables: Deep fried potatoes (french fries). Fruits: Fruit canned in syrup. Meats and other protein foods: Beef. Pork. Lamb. Poultry with skin. Hot dogs. [place] Salines. Dairy: Ice cream. Sour cream. Whole milk. Beverages: Juice. Sugar-sweetened soft drinks. Beer. Liquor and spirits. Fats and oils: Butter. Canola oil. Vegetable oil. Beef fat (tallow). Lard. Sweets and desserts: Cookies. Cakes. Pies. Candy. Seasoning and other foods: Mayonnaise. Premade sauces and marinades. The items listed may not be a complete list. Talk with your dietitian about what dietary choices are right for you. Summary The Mediterranean diet includes both food and lifestyle choices. Eat a variety of fresh fruits and vegetables, beans, nuts, seeds, and whole grains. Limit the amount of red meat and sweets that you eat. Talk with your health care provider about whether it is safe for you to drink red wine in moderation. This means 1 glass a day for nonpregnant women and 2 glasses a day for men. A glass of wine equals 5 oz (150 mL). This information is not intended to replace advice given to you by your health care provider. Make sure you discuss any questions you have with your health  care provider. Document Released: 02/10/2016 Document Revised: 03/14/2016 Document Reviewed: 02/10/2016 Elsevier Interactive Patient Education  2017 [employer].

## 2022-01-19 DIAGNOSIS — L932 Other local lupus erythematosus: Secondary | ICD-10-CM | POA: Diagnosis not present

## 2022-01-19 DIAGNOSIS — L02821 Furuncle of head [any part, except face]: Secondary | ICD-10-CM | POA: Diagnosis not present

## 2022-01-25 ENCOUNTER — Other Ambulatory Visit: Payer: Self-pay | Admitting: Physician Assistant

## 2022-01-27 ENCOUNTER — Encounter: Payer: Self-pay | Admitting: Gastroenterology

## 2022-02-07 ENCOUNTER — Other Ambulatory Visit: Payer: Self-pay | Admitting: Endocrinology

## 2022-02-08 ENCOUNTER — Telehealth: Payer: Self-pay | Admitting: Internal Medicine

## 2022-02-08 NOTE — Telephone Encounter (Signed)
LVM for pt to rtn my call to schedule AWV with [facility] call back # [phone] 

## 2022-02-14 ENCOUNTER — Ambulatory Visit: Payer: Self-pay | Admitting: Licensed Clinical Social Worker

## 2022-02-14 NOTE — Patient Outreach (Signed)
  Care Coordination   02/14/2022 Name: Leslie Lane MRN: [id number] DOB: 1956/06/13   Care Coordination Outreach Attempts:  An unsuccessful telephone outreach was attempted today to offer the patient information about available care coordination services as a benefit of their health plan.   Follow Up Plan:  Additional outreach attempts will be made to offer the patient care coordination information and services.   Encounter Outcome:  No Answer  Care Coordination Interventions Activated:  No   Care Coordination Interventions:  No, not indicated    Casimer Lanius, [place] [phone]

## 2022-02-16 DIAGNOSIS — E1065 Type 1 diabetes mellitus with hyperglycemia: Secondary | ICD-10-CM | POA: Diagnosis not present

## 2022-02-20 ENCOUNTER — Other Ambulatory Visit: Payer: Self-pay

## 2022-02-20 NOTE — Patient Outreach (Signed)
  Care Coordination   Initial Visit Note   02/20/2022 Name: SHAANA ACOCELLA MRN: [id number] DOB: December 12, 1955  IFEOLUWA BARTZ is a 66 y.o. year old female who sees [name], MD for primary care. I spoke with  [name] by phone today  What matters to the patients health and wellness today?  Pain/swelling to left leg. She states she was seen in June 2023 for this concern, but states her hip is not hurting, but it is her leg. She reports pain noted with activity. She reports has not improved.    Goals Addressed             This Visit's Progress    decrease pain to left leg       Care Coordination Interventions: Reports 7/10 on the pain scale Reviewed provider established plan for pain management Encouraged patient to call to schedule follow up with primary care provider regarding left leg pain. Discussed Annual wellness visit, provided contact number and encouraged patient to call and schedule         SDOH assessments and interventions completed:  Yes  SDOH Interventions Today    Flowsheet Row Most Recent Value  SDOH Interventions   Food Insecurity Interventions Intervention Not Indicated  Housing Interventions Intervention Not Indicated  Transportation Interventions Intervention Not Indicated        Care Coordination Interventions Activated:  Yes  Care Coordination Interventions:  Yes, provided   Follow up plan: Follow up call scheduled for 03/28/22    Encounter Outcome:  Pt. Visit Completed   Thea Silversmith, RN, MSN, BSN, [place] Coordinator [phone]

## 2022-03-14 ENCOUNTER — Ambulatory Visit (INDEPENDENT_AMBULATORY_CARE_PROVIDER_SITE_OTHER): Payer: Medicare Other

## 2022-03-14 VITALS — BP 130/60 | HR 73 | Temp 97.8°F | Ht 65.3 in | Wt 106.0 lb

## 2022-03-14 DIAGNOSIS — Z0000 Encounter for general adult medical examination without abnormal findings: Secondary | ICD-10-CM | POA: Diagnosis not present

## 2022-03-14 NOTE — Progress Notes (Signed)
Subjective:   Leslie Lane is a 66 y.o. female who presents for Medicare Annual (Subsequent) preventive examination.  Review of Systems     Cardiac Risk Factors include: advanced age (>64mn, >>66women);diabetes mellitus;family history of premature cardiovascular disease;smoking/ tobacco exposure     Objective:    Today's Vitals   03/14/22 1332 03/14/22 1343  BP: 130/60   Pulse: 73   Temp: 97.8 F (36.6 C)   TempSrc: Temporal   SpO2: 98%   Weight: 106 lb (48.1 kg)   Height: 5' 5.3" (1.659 m)   PainSc: 10-Worst pain ever 10-Worst pain ever  PainLoc: Leg    Body mass index is 17.48 kg/m.     03/14/2022    1:47 PM 01/18/2022    2:30 PM 07/21/2021    2:57 PM 07/30/2019    1:16 PM 03/20/2019    4:08 PM 12/18/2018   10:54 AM 05/30/2017   11:43 AM  Advanced Directives  Does Patient Have a Medical Advance Directive? No No No No No No No  Would patient like information on creating a medical advance directive? No - Patient declined    No - Patient declined  Yes (ED - Information included in AVS)    Current Medications (verified) Outpatient Encounter Medications as of 03/14/2022  Medication Sig   Accu-Chek Softclix Lancets lancets Use as instructed to check blood sugar 4 times per day dx code E10.65   Alcohol Swabs (B-D SINGLE USE SWABS REGULAR) PADS USE FOUR TIMES DAILY   aspirin EC 81 MG tablet Take 81 mg by mouth every morning.   B-D ULTRAFINE III SHORT PEN 31G X 8 MM MISC SMARTSIG:pre-filled pen syringe SUB-Q Daily   Blood Glucose Calibration (ACCU-CHEK AVIVA) SOLN USE AS DIRECTED   Blood Glucose Monitoring Suppl (ACCU-CHEK AVIVA PLUS) w/Device KIT USE AS DIRECTED   Clobetasol Propionate 0.05 % shampoo Apply topically.   Continuous Blood Gluc Sensor (FREESTYLE LIBRE 2 SENSOR) MISC Use to check blood sugar daily   donepezil (ARICEPT) 10 MG tablet TAKE 1 TABLET BY MOUTH AT NIGHT   doxycycline (VIBRAMYCIN) 100 MG capsule Take 100 mg by mouth daily.   fexofenadine (ALLEGRA)  180 MG tablet Take 180 mg by mouth daily.   Glucagon (GVOKE HYPOPEN 2-PACK) 1 MG/0.2ML SOAJ Inject 1 mg into the skin as needed for low blood sugar.   glucose blood (ACCU-CHEK AVIVA PLUS) test strip TEST BLOOD SUGAR 4 TIMES  DAILY   hyoscyamine (LEVBID) 0.375 MG 12 hr tablet Take 1 tablet (0.375 mg total) by mouth 2 (two) times daily.   insulin detemir (LEVEMIR FLEXTOUCH) 100 UNIT/ML FlexPen Inject 3 units in the morning and 6 units at bedtime (Patient taking differently: Inject 3 units in the morning and 7 units at bedtime)   insulin lispro (HUMALOG KWIKPEN) 100 UNIT/ML KwikPen INJECT SUBCUTANEOUSLY 1 TO  3 UNITS 3 TIMES DAILY FOR  LARGE MEALS   Insulin Pen Needle 32G X 4 MM MISC Use inject insulin tid   lamoTRIgine (LAMICTAL) 100 MG tablet Take 1 tablet (100 mg total) by mouth 2 (two) times daily.   lipase/protease/amylase (CREON) 36000 UNITS CPEP capsule Take 2 capsules by mouth three times a day with meals and 1 capsule by mouth twice daily with snacks   lovastatin (MEVACOR) 40 MG tablet TAKE 1 TABLET BY MOUTH AT  BEDTIME   Magnesium 500 MG TABS Take 1 tablet by mouth daily.   Multiple Vitamin (MULTIVITAMIN) tablet Take 1 tablet by mouth daily. Reported on 11/22/2015  Omega-3 Fatty Acids (FISH OIL) 1000 MG CAPS Take 1 capsule by mouth daily.   omeprazole (PRILOSEC) 40 MG capsule TAKE 1 CAPSULE(40 MG) BY MOUTH IN THE MORNING AND AT BEDTIME   traZODone (DESYREL) 100 MG tablet Take 2 tablets (200 mg total) by mouth at bedtime.   triamcinolone cream (KENALOG) 0.1 %    umeclidinium-vilanterol (ANORO ELLIPTA) 62.5-25 MCG/ACT AEPB INHALE 1 PUFF INTO THE LUNGS DAILY   vitamin E 1000 UNIT capsule Take 1,000 Units by mouth daily.   No facility-administered encounter medications on file as of 03/14/2022.    Allergies (verified) Penicillins and Sulfonamide derivatives   History: Past Medical History:  Diagnosis Date   Abdominal pain 02/28/2016   Abnormal CT scan, bladder    Noted 07/28/13 CT -  s/p uro eval 08/2013 [place] -    Acquired trigger finger of left ring finger 02/18/2019   Injected February 18, 2019 and March 2022   Allergic rhinitis    Anemia    Arthritis    Bipolar disorder 07/05/2010   Cataract    Chronic lower back pain 07/05/2010   Chronic neck pain    Diabetic gastroparesis associated with type 1 diabetes mellitus 10/24/2016   Fibromyalgia    Generalized anxiety disorder 04/27/2014   GERD (gastroesophageal reflux disease) 07/05/2010   Hepatitis B virus infection 07/05/2010   History of substance abuse    opiates; cocaine   Hypoglycemia unawareness associated with type 1 diabetes mellitus 11/23/2020   Insomnia 04/13/2015   Major depressive disorder 07/28/2014   Migraine headache 07/05/2010   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  It hasn't happened in a long time."   Smokers' cough 07/05/2010   Tubular adenoma of colon 2018   Type 1 diabetes mellitus with complication 71/69/6789   WBC decreased 03/27/2017   Past Surgical History:  Procedure Laterality Date   CESAREAN SECTION     COLONOSCOPY     TUBAL LIGATION     UPPER GASTROINTESTINAL ENDOSCOPY     Family History  Problem Relation Age of Onset   Arthritis Mother    Diverticulosis Mother    Kidney disease Mother    Hyperlipidemia Mother    Arthritis Father    Kidney disease Father    Kidney cancer Father    Bladder Cancer Sister    Colon cancer Sister 5   Celiac disease Sister    Heart attack Brother    Kidney cancer Paternal Aunt    Diabetes Niece    Hyperlipidemia Other    Anxiety disorder Neg Hx    Bipolar disorder Neg Hx    Depression Neg Hx    Breast cancer Neg Hx    Esophageal cancer Neg Hx    Stomach  cancer Neg Hx    Rectal cancer Neg Hx    Social History   Socioeconomic History   Marital status: Divorced    Spouse name: Not on file   Number of children: 1   Years of education: 12   Highest education level: High school graduate  Occupational History   Occupation: Disability  Tobacco Use   Smoking status: Every Day    Packs/day: 1.50    Years: 50.00    Total pack  years: 75.00    Types: Cigarettes   Smokeless tobacco: Never   Tobacco comments:    Reports she has cut back to a cartoon that now lasts 2 weeks.   Vaping Use   Vaping Use: Former  Substance and Sexual Activity   Alcohol use: Yes    Alcohol/week: 7.0 standard drinks of alcohol    Types: 7 Cans of beer per week    Comment: 1 beer nightly   Drug use: Not Currently    Types: Cocaine    Comment: past opiate and cocaine abuse   Sexual activity: Not Currently    Birth control/protection: Surgical, Post-menopausal  Other Topics Concern   Not on file  Social History Narrative   Divorced, disabled   Lives with parents   Right handed    Social Determinants of Health   Financial Resource Strain: Low Risk  (03/14/2022)   Overall Financial Resource Strain (CARDIA)    Difficulty of Paying Living Expenses: Not hard at all  Food Insecurity: No Food Insecurity (03/14/2022)   Hunger Vital Sign    Worried About Running Out of Food in the Last Year: Never true    Ran Out of Food in the Last Year: Never true  Transportation Needs: No Transportation Needs (03/14/2022)   PRAPARE - Hydrologist (Medical): No    Lack of Transportation (Non-Medical): No  Physical Activity: Sufficiently Active (03/14/2022)   Exercise Vital Sign    Days of Exercise per Week: 5 days    Minutes of Exercise per Session: 30 min  Stress: Stress Concern Present (03/14/2022)   [place]    Feeling of Stress : To some extent  Social Connections: Socially  Integrated (03/14/2022)   Social Connection and Isolation Panel [NHANES]    Frequency of Communication with Friends and Family: More than three times a week    Frequency of Social Gatherings with Friends and Family: More than three times a week    Attends Religious Services: More than 4 times per year    Active Member of [employer] or Organizations: Yes    Attends Music therapist: More than 4 times per year    Marital Status: Married    Tobacco Counseling Ready to quit: Not Answered Counseling given: Not Answered Tobacco comments: Reports she has cut back to a cartoon that now lasts 2 weeks.    Clinical Intake:  Pre-visit preparation completed: Yes  Pain : 0-10 Pain Score: 10-Worst pain ever Pain Type: Chronic pain Pain Location: Leg Pain Orientation: Left, Lateral Pain Radiating Towards: none Pain Descriptors / Indicators: Aching, Discomfort, Shooting Pain Onset: More than a month ago Pain Frequency: Constant Pain Relieving Factors: Tylenol, Exercises  Pain Relieving Factors: Tylenol, Exercises  BMI - recorded: 17.48 Nutritional Status: BMI <19  Underweight Nutritional Risks: Nausea/ vomitting/ diarrhea Diabetes: Yes CBG done?: No Did pt. bring in CBG monitor from home?: No  How often do you need to have someone help you when you read instructions, pamphlets, or other written materials from your doctor or pharmacy?: 1 - Never What is the last grade level you completed in school?: HSG  Diabetic? yes  Interpreter Needed?: No  Information entered by :: Lisette Abu, LPN.   Activities of Daily Living    03/14/2022    2:12 PM 06/17/2021    2:17 PM  In your present state of health, do you have any difficulty performing the following activities:  Hearing? 0 0  Vision? 0 0  Difficulty concentrating or making decisions? 1 0  Comment on medication   Walking or climbing stairs? 0 0  Dressing or bathing? 0 0  Doing errands, shopping? 0 0  Preparing Food  and eating ? N   Using the Toilet? N   In the past six months, have you accidently leaked urine? N   Do you have problems with loss of bowel control? N   Managing your Medications? N   Managing your Finances? N   Housekeeping or managing your Housekeeping? N     Patient Care Team: [name], MD as PCP - General (Internal Medicine) [name], MD as Consulting Physician (Endocrinology) Amil Amen, MD as Consulting Physician (Psychiatry) Ricard Dillon, MD as Consulting Physician (Psychiatry) Ladene Artist, MD (Gastroenterology) Franchot Gallo, MD (Urology) Cameron Sprang, MD as Consulting Physician (Neurology) Szabat, Darnelle Maffucci, [facility] (Inactive) as Pharmacist (Pharmacist) Luretha Rued, RN as [place] any recent Medical Services you may have received from other than [facility] providers in the past year (date may be approximate).     Assessment:   This is a routine wellness examination for Leslie Lane.  Hearing/Vision screen Hearing Screening - Comments:: Denies hearing difficulties   Vision Screening - Comments:: Wears rx glasses - up to date with routine eye exams with [facility] on [place] issues and exercise activities discussed: Current Exercise Habits: Home exercise routine, Type of exercise: walking, Time (Minutes): 30, Frequency (Times/Week): 5, Weekly Exercise (Minutes/Week): 150, Intensity: Mild, Exercise limited by: orthopedic condition(s);psychological condition(s)   Goals Addressed             This Visit's Progress    Decrease pain in my left leg and stay as independent as possible.        Depression Screen    03/14/2022    2:10 PM 12/23/2021    1:48 PM 12/01/2021    2:43 PM 10/06/2021   10:30 AM 09/09/2021   10:54 AM 08/11/2021   10:10 AM 06/02/2021    3:27 PM  PHQ 2/9 Scores  PHQ - 2 Score 2 2   4     PHQ- 9 Score 2 2   7        Information is confidential and restricted. Go to  Review Flowsheets to unlock data.    Fall Risk    03/14/2022    1:50 PM 01/18/2022    2:30 PM 12/23/2021    1:48 PM 09/09/2021   10:54 AM 07/21/2021    2:56 PM  Fall Risk   Falls in the past year? 0 0 0 0 0  Number falls in past yr: 0 0 0 0 0  Injury with Fall? 0 0 0 0 0  Risk for fall due to : No Fall Risks   No Fall Risks   Follow up Falls prevention discussed   Falls evaluation completed     FALL RISK PREVENTION PERTAINING TO THE HOME:  Any stairs in or around the home? No  If so, are there any without handrails? No  Home free of loose throw rugs in walkways, pet beds, electrical cords, etc? Yes  Adequate lighting in your home to reduce risk of falls? Yes   ASSISTIVE DEVICES UTILIZED TO PREVENT FALLS:  Life alert? No  Use of a cane, walker or w/c? No  Grab bars in the bathroom? No  Shower chair or bench in shower? Yes  Elevated  toilet seat or a handicapped toilet? No   TIMED UP AND GO:  Was the test performed? Yes .  Length of time to ambulate 10 feet: 6 sec.   Gait steady and fast without use of assistive device  Cognitive Function:    12/03/2020    3:00 PM 05/30/2017   11:57 AM  MMSE - Mini Mental State Exam  Orientation to time 5 5  Orientation to Place 5 5  Registration 3 3  Attention/ Calculation 0 3  Recall 0 2  Language- name 2 objects 2 2  Language- repeat 1 1  Language- follow 3 step command 3 3  Language- read & follow direction 1 1  Write a sentence 1 1  Copy design 1 1  Total score 22 27      07/30/2019    1:00 PM 12/18/2018   11:00 AM  Montreal Cognitive Assessment   Visuospatial/ Executive (0/5)  1  Naming (0/3)  3  Attention: Read list of digits (0/2) 1 1  Attention: Read list of letters (0/1) 1 1  Attention: Serial 7 subtraction starting at 100 (0/3) 1 2  Language: Repeat phrase (0/2) 0 0  Language : Fluency (0/1) 0 0  Abstraction (0/2) 2 0  Delayed Recall (0/5) 4 0  Orientation (0/6) 6 5  Total  13  Adjusted Score (based on  education)  14      03/14/2022    2:11 PM  6CIT Screen  What Year? 0 points  What month? 0 points  What time? 0 points  Count back from 20 0 points  Months in reverse 2 points  Repeat phrase 0 points  Total Score 2 points    Immunizations Immunization History  Administered Date(s) Administered   Fluad Quad(high Dose 65+) 04/24/2019, 04/20/2020, 04/28/2021   Influenza Split 03/22/2011, 03/26/2012   Influenza,inj,Quad PF,6+ Mos 02/28/2013, 04/13/2014, 05/13/2015, 02/28/2016, 03/26/2017, 06/04/2018   PFIZER(Purple Top)SARS-COV-2 Vaccination 10/01/2019, 10/29/2019, 06/21/2020   Pneumococcal Conjugate-13 07/24/2016   Pneumococcal Polysaccharide-23 07/31/2013, 06/17/2019   Td 07/28/2011   Zoster, Live 05/31/2016    TDAP status: Due, Education has been provided regarding the importance of this vaccine. Advised may receive this vaccine at local pharmacy or Health Dept. Aware to provide a copy of the vaccination record if obtained from local pharmacy or Health Dept. Verbalized acceptance and understanding.  Flu Vaccine status: Due, Education has been provided regarding the importance of this vaccine. Advised may receive this vaccine at local pharmacy or Health Dept. Aware to provide a copy of the vaccination record if obtained from local pharmacy or Health Dept. Verbalized acceptance and understanding.  Pneumococcal vaccine status: Up to date  Covid-19 vaccine status: Completed vaccines  Qualifies for Shingles Vaccine? Yes   Zostavax completed Yes   Shingrix Completed?: No.    Education has been provided regarding the importance of this vaccine. Patient has been advised to call insurance company to determine out of pocket expense if they have not yet received this vaccine. Advised may also receive vaccine at local pharmacy or Health Dept. Verbalized acceptance and understanding.  Screening Tests Health Maintenance  Topic Date Due   Zoster Vaccines- Shingrix (1 of 2) Never done    COVID-19 Vaccine (4 - Pfizer series) 08/16/2020   TETANUS/TDAP  07/27/2021   OPHTHALMOLOGY EXAM  01/07/2022   INFLUENZA VACCINE  01/31/2022   Diabetic kidney evaluation - Urine ACR  06/07/2022   HEMOGLOBIN A1C  06/13/2022   FOOT EXAM  06/17/2022   Diabetic kidney  evaluation - GFR measurement  09/07/2022   MAMMOGRAM  10/04/2023   COLONOSCOPY (Pts 45-74yr Insurance coverage will need to be confirmed)  12/06/2031   Pneumonia Vaccine 68 Years old  Completed   DEXA SCAN  Completed   Hepatitis C Screening  Completed   HPV VACCINES  Aged Out    Health Maintenance  Health Maintenance Due  Topic Date Due   Zoster Vaccines- Shingrix (1 of 2) Never done   COVID-19 Vaccine (4 - Pfizer series) 08/16/2020   TETANUS/TDAP  07/27/2021   OPHTHALMOLOGY EXAM  01/07/2022   INFLUENZA VACCINE  01/31/2022    Colorectal cancer screening: Type of screening: Colonoscopy. Completed 12/05/2021. Repeat every 10 years  Mammogram status: Completed 10/03/2021. Repeat every year  Bone Density status: Completed 08/31/2015. Results reflect: Bone density results: OSTEOPENIA. Repeat every 2-3 years.  Lung Cancer Screening: (Low Dose CT Chest recommended if Age 68-80years, 30 pack-year currently smoking OR have quit w/in 15years.) does qualify.   Lung Cancer Screening Referral: yes; scheduled for 03/10/2022  Additional Screening:  Hepatitis C Screening: does qualify; Completed 9/124/2013  Vision Screening: Recommended annual ophthalmology exams for early detection of glaucoma and other disorders of the eye. Is the patient up to date with their annual eye exam?  Yes  Who is the provider or what is the name of the office in which the patient attends annual eye exams? [facility] If pt is not established with a provider, would they like to be referred to a provider to establish care? No .   Dental Screening: Recommended annual dental exams for proper oral hygiene  Community Resource Referral / Chronic Care  Management: CRR required this visit?  No   CCM required this visit?  No      Plan:     I have personally reviewed and noted the following in the patient's chart:   Medical and social history Use of alcohol, tobacco or illicit drugs  Current medications and supplements including opioid prescriptions. Patient is not currently taking opioid prescriptions. Functional ability and status Nutritional status Physical activity Advanced directives List of other physicians Hospitalizations, surgeries, and ER visits in previous 12 months Vitals Screenings to include cognitive, depression, and falls Referrals and appointments  In addition, I have reviewed and discussed with patient certain preventive protocols, quality metrics, and best practice recommendations. A written personalized care plan for preventive services as well as general preventive health recommendations were provided to patient.     SSheral Flow LPN   [id number]  Nurse Notes:  Cognitive status assessed by direct observation. Patient has current diagnosis of cognitive impairment. Patient is followed by neurology for ongoing assessment. Patient was able to complete screening 6CIT or MMSE.

## 2022-03-14 NOTE — Patient Instructions (Addendum)
[name] , Thank you for taking time to come for your Medicare Wellness Visit. I appreciate your ongoing commitment to your health goals. Please review the following plan we discussed and let me know if I can assist you in the future.   Screening recommendations/referrals: Colonoscopy: 12/05/2021; due every 10 years Mammogram: 10/03/2021; due every 1-2 years Bone Density: 08/31/2015; due every 2-3 years  Recommended yearly ophthalmology/optometry visit for glaucoma screening and checkup Recommended yearly dental visit for hygiene and checkup  Vaccinations: Influenza vaccine: 04/28/2021; will wait until October 2023 Pneumococcal vaccine: 07/24/2016, 06/17/2019 Tdap vaccine: 07/28/2011; due every 10 years (overdue) Shingles vaccine: never done   Covid-19:10/01/2019, 10/29/2019, 06/21/2020  Advanced directives: No  Conditions/risks identified: Yes; Staying independent.  Next appointment: Follow up in one year for your annual wellness visit on: 03/16/2023 at 1:30 p.m in the office.   Preventive Care 68 Years and Older, Female Preventive care refers to lifestyle choices and visits with your health care provider that can promote health and wellness. What does preventive care include? A yearly physical exam. This is also called an annual well check. Dental exams once or twice a year. Routine eye exams. Ask your health care provider how often you should have your eyes checked. Personal lifestyle choices, including: Daily care of your teeth and gums. Regular physical activity. Eating a healthy diet. Avoiding tobacco and drug use. Limiting alcohol use. Practicing safe sex. Taking low-dose aspirin every day. Taking vitamin and mineral supplements as recommended by your health care provider. What happens during an annual well check? The services and screenings done by your health care provider during your annual well check will depend on your age, overall health, lifestyle risk factors, and  family history of disease. Counseling  Your health care provider may ask you questions about your: Alcohol use. Tobacco use. Drug use. Emotional well-being. Home and relationship well-being. Sexual activity. Eating habits. History of falls. Memory and ability to understand (cognition). Work and work Statistician. Reproductive health. Screening  You may have the following tests or measurements: Height, weight, and BMI. Blood pressure. Lipid and cholesterol levels. These may be checked every 5 years, or more frequently if you are over 62 years old. Skin check. Lung cancer screening. You may have this screening every year starting at age 68 if you have a 30-pack-year history of smoking and currently smoke or have quit within the past 15 years. Fecal occult blood test (FOBT) of the stool. You may have this test every year starting at age 68. Flexible sigmoidoscopy or colonoscopy. You may have a sigmoidoscopy every 5 years or a colonoscopy every 10 years starting at age 35. Hepatitis C blood test. Hepatitis B blood test. Sexually transmitted disease (STD) testing. Diabetes screening. This is done by checking your blood sugar (glucose) after you have not eaten for a while (fasting). You may have this done every 1-3 years. Bone density scan. This is done to screen for osteoporosis. You may have this done starting at age 68. Mammogram. This may be done every 1-2 years. Talk to your health care provider about how often you should have regular mammograms. Talk with your health care provider about your test results, treatment options, and if necessary, the need for more tests. Vaccines  Your health care provider may recommend certain vaccines, such as: Influenza vaccine. This is recommended every year. Tetanus, diphtheria, and acellular pertussis (Tdap, Td) vaccine. You may need a Td booster every 10 years. Zoster vaccine. You may need this after age  68. Pneumococcal 13-valent conjugate  (PCV13) vaccine. One dose is recommended after age 68. Pneumococcal polysaccharide (PPSV23) vaccine. One dose is recommended after age 68. Talk to your health care provider about which screenings and vaccines you need and how often you need them. This information is not intended to replace advice given to you by your health care provider. Make sure you discuss any questions you have with your health care provider. Document Released: 07/16/2015 Document Revised: 03/08/2016 Document Reviewed: 04/20/2015 Elsevier Interactive Patient Education  2017 [place] Prevention in the Home Falls can cause injuries. They can happen to people of all ages. There are many things you can do to make your home safe and to help prevent falls. What can I do on the outside of my home? Regularly fix the edges of walkways and driveways and fix any cracks. Remove anything that might make you trip as you walk through a door, such as a raised step or threshold. Trim any bushes or trees on the path to your home. Use bright outdoor lighting. Clear any walking paths of anything that might make someone trip, such as rocks or tools. Regularly check to see if handrails are loose or broken. Make sure that both sides of any steps have handrails. Any raised decks and porches should have guardrails on the edges. Have any leaves, snow, or ice cleared regularly. Use sand or salt on walking paths during winter. Clean up any spills in your garage right away. This includes oil or grease spills. What can I do in the bathroom? Use night lights. Install grab bars by the toilet and in the tub and shower. Do not use towel bars as grab bars. Use non-skid mats or decals in the tub or shower. If you need to sit down in the shower, use a plastic, non-slip stool. Keep the floor dry. Clean up any water that spills on the floor as soon as it happens. Remove soap buildup in the tub or shower regularly. Attach bath mats securely with  double-sided non-slip rug tape. Do not have throw rugs and other things on the floor that can make you trip. What can I do in the bedroom? Use night lights. Make sure that you have a light by your bed that is easy to reach. Do not use any sheets or blankets that are too big for your bed. They should not hang down onto the floor. Have a firm chair that has side arms. You can use this for support while you get dressed. Do not have throw rugs and other things on the floor that can make you trip. What can I do in the kitchen? Clean up any spills right away. Avoid walking on wet floors. Keep items that you use a lot in easy-to-reach places. If you need to reach something above you, use a strong step stool that has a grab bar. Keep electrical cords out of the way. Do not use floor polish or wax that makes floors slippery. If you must use wax, use non-skid floor wax. Do not have throw rugs and other things on the floor that can make you trip. What can I do with my stairs? Do not leave any items on the stairs. Make sure that there are handrails on both sides of the stairs and use them. Fix handrails that are broken or loose. Make sure that handrails are as long as the stairways. Check any carpeting to make sure that it is firmly attached to the stairs. Fix  any carpet that is loose or worn. Avoid having throw rugs at the top or bottom of the stairs. If you do have throw rugs, attach them to the floor with carpet tape. Make sure that you have a light switch at the top of the stairs and the bottom of the stairs. If you do not have them, ask someone to add them for you. What else can I do to help prevent falls? Wear shoes that: Do not have high heels. Have rubber bottoms. Are comfortable and fit you well. Are closed at the toe. Do not wear sandals. If you use a stepladder: Make sure that it is fully opened. Do not climb a closed stepladder. Make sure that both sides of the stepladder are locked  into place. Ask someone to hold it for you, if possible. Clearly mark and make sure that you can see: Any grab bars or handrails. First and last steps. Where the edge of each step is. Use tools that help you move around (mobility aids) if they are needed. These include: Canes. Walkers. Scooters. Crutches. Turn on the lights when you go into a dark area. Replace any light bulbs as soon as they burn out. Set up your furniture so you have a clear path. Avoid moving your furniture around. If any of your floors are uneven, fix them. If there are any pets around you, be aware of where they are. Review your medicines with your doctor. Some medicines can make you feel dizzy. This can increase your chance of falling. Ask your doctor what other things that you can do to help prevent falls. This information is not intended to replace advice given to you by your health care provider. Make sure you discuss any questions you have with your health care provider. Document Released: 04/15/2009 Document Revised: 11/25/2015 Document Reviewed: 07/24/2014 Elsevier Interactive Patient Education  2017 [employer].

## 2022-03-20 ENCOUNTER — Inpatient Hospital Stay: Admission: RE | Admit: 2022-03-20 | Payer: Medicare Other | Source: Ambulatory Visit

## 2022-03-20 ENCOUNTER — Telehealth: Payer: Medicare Other

## 2022-03-23 ENCOUNTER — Other Ambulatory Visit (HOSPITAL_COMMUNITY): Payer: Self-pay

## 2022-03-23 ENCOUNTER — Other Ambulatory Visit: Payer: Self-pay | Admitting: Endocrinology

## 2022-03-23 MED ORDER — GVOKE HYPOPEN 2-PACK 1 MG/0.2ML ~~LOC~~ SOAJ
1.0000 mg | SUBCUTANEOUS | 1 refills | Status: DC | PRN
  Filled 2022-03-23: qty 0.4, 7d supply, fill #0
  Filled 2022-04-07: qty 0.4, 7d supply, fill #1

## 2022-03-24 ENCOUNTER — Other Ambulatory Visit (HOSPITAL_COMMUNITY): Payer: Self-pay

## 2022-03-28 ENCOUNTER — Telehealth: Payer: Self-pay

## 2022-03-28 ENCOUNTER — Ambulatory Visit
Admission: RE | Admit: 2022-03-28 | Discharge: 2022-03-28 | Disposition: A | Discharge status: Home / Self Care | Payer: Medicare Other | Source: Ambulatory Visit | Attending: Acute Care | Admitting: Acute Care

## 2022-03-28 DIAGNOSIS — F17210 Nicotine dependence, cigarettes, uncomplicated: Secondary | ICD-10-CM | POA: Diagnosis not present

## 2022-03-28 DIAGNOSIS — L821 Other seborrheic keratosis: Secondary | ICD-10-CM | POA: Diagnosis not present

## 2022-03-28 DIAGNOSIS — L02821 Furuncle of head [any part, except face]: Secondary | ICD-10-CM | POA: Diagnosis not present

## 2022-03-28 DIAGNOSIS — D492 Neoplasm of unspecified behavior of bone, soft tissue, and skin: Secondary | ICD-10-CM | POA: Diagnosis not present

## 2022-03-28 DIAGNOSIS — Z87891 Personal history of nicotine dependence: Secondary | ICD-10-CM

## 2022-03-28 NOTE — Patient Outreach (Signed)
  Care Coordination   03/28/2022 Name: Leslie Lane MRN: [id number] DOB: 1955-08-24   Care Coordination Outreach Attempts:  An unsuccessful telephone outreach was attempted today to offer the patient information about available care coordination services as a benefit of their health plan.   Follow Up Plan:  Additional outreach attempts will be made to offer the patient care coordination information and services.   Encounter Outcome:  No Answer  Care Coordination Interventions Activated:  No   Care Coordination Interventions:  No, not indicated    Thea Silversmith, RN, MSN, BSN, [place] Coordinator [phone]

## 2022-03-30 ENCOUNTER — Other Ambulatory Visit: Payer: Self-pay

## 2022-03-30 DIAGNOSIS — Z122 Encounter for screening for malignant neoplasm of respiratory organs: Secondary | ICD-10-CM

## 2022-03-30 DIAGNOSIS — Z87891 Personal history of nicotine dependence: Secondary | ICD-10-CM

## 2022-03-30 DIAGNOSIS — Z716 Tobacco abuse counseling: Secondary | ICD-10-CM

## 2022-03-30 DIAGNOSIS — F17210 Nicotine dependence, cigarettes, uncomplicated: Secondary | ICD-10-CM

## 2022-03-31 ENCOUNTER — Telehealth: Payer: Self-pay

## 2022-03-31 NOTE — Telephone Encounter (Signed)
ADS requested last two chart notes be faxed. Faxed chart notes

## 2022-04-05 ENCOUNTER — Other Ambulatory Visit: Payer: Self-pay

## 2022-04-06 ENCOUNTER — Ambulatory Visit (HOSPITAL_BASED_OUTPATIENT_CLINIC_OR_DEPARTMENT_OTHER): Payer: Medicare Other | Admitting: Psychiatry

## 2022-04-06 DIAGNOSIS — G4701 Insomnia due to medical condition: Secondary | ICD-10-CM

## 2022-04-06 DIAGNOSIS — F4322 Adjustment disorder with anxiety: Secondary | ICD-10-CM | POA: Diagnosis not present

## 2022-04-06 DIAGNOSIS — F332 Major depressive disorder, recurrent severe without psychotic features: Secondary | ICD-10-CM

## 2022-04-06 DIAGNOSIS — F4010 Social phobia, unspecified: Secondary | ICD-10-CM

## 2022-04-06 MED ORDER — ESCITALOPRAM OXALATE 5 MG PO TABS: 5.0000 mg | ORAL_TABLET | Freq: Every day | ORAL | 1 refills | Status: DC

## 2022-04-06 MED ORDER — LAMOTRIGINE 100 MG PO TABS: 100.0000 mg | ORAL_TABLET | Freq: Two times a day (BID) | ORAL | 0 refills | Status: DC

## 2022-04-06 MED ORDER — TRAZODONE HCL 100 MG PO TABS: 200.0000 mg | ORAL_TABLET | Freq: Every day | ORAL | 0 refills | Status: DC

## 2022-04-06 NOTE — Progress Notes (Signed)
BH MD/PA/NP OP Progress Note  04/06/2022 12:13 PM Leslie Lane  MRN:  [id number]  Chief Complaint:  Chief Complaint  Patient presents with   Depression   Anxiety   HPI: "Irritable". Leslie Lane notes she doesn't like to drive due to anger with how other people drive. She will often roll down her windows and cuss at them. Her anxiety is ongoing. She sweats and cries all the time. She feels anxious most of the time. She is easily bored and "can't stand myself". She can't concentration at all. Leslie Lane is easily frustrated and finds that nothing is enjoyable. She gets annoyed with herself and those around her. Her depression is constant. Her sleep remains poor. She wakes up multiple times a night and only gets 4-6 hrs/night. Her energy is very low. She is stress eating and is gaining a little weight. She eats more at night. She denies SI/HI. Leslie Lane always wishes she was dead. Leslie Lane wants to go to [place] so she doesn't want to kill herself. Her older sister's health is getting worse and they were always very close. It is making Leslie Lane more depressed. Leslie Lane often thinks about the future and what will happen if her own health declines drastically. Leslie Lane is very fearful of trying any medication. She doesn't like to go out to places like grocery stores and always rushes when she goes out anywhere.   Visit Diagnosis:    ICD-10-CM   1. Major depressive disorder, recurrent, severe without psychotic features ([place])  F33.2 escitalopram (LEXAPRO) 5 MG tablet    lamoTRIgine (LAMICTAL) 100 MG tablet    traZODone (DESYREL) 100 MG tablet    2. Insomnia due to medical condition  G47.01 traZODone (DESYREL) 100 MG tablet    3. Adjustment disorder with anxious mood  F43.22 escitalopram (LEXAPRO) 5 MG tablet    4. Social anxiety disorder  F40.10 escitalopram (LEXAPRO) 5 MG tablet    lamoTRIgine (LAMICTAL) 100 MG tablet      Past Psychiatric History: no updates. See H&P  Past Medical History:  Past Medical History:   Diagnosis Date   Abdominal pain 02/28/2016   Abnormal CT scan, bladder    Noted 07/28/13 CT - s/p uro eval 08/2013 [place] -    Acquired trigger finger of left ring finger 02/18/2019   Injected February 18, 2019 and March 2022   Allergic rhinitis    Anemia    Arthritis    Bipolar disorder 07/05/2010   Cataract    Chronic lower back pain 07/05/2010   Chronic neck pain    Diabetic gastroparesis associated with type 1 diabetes mellitus 10/24/2016   Fibromyalgia    Generalized anxiety disorder 04/27/2014   GERD (gastroesophageal reflux disease) 07/05/2010   Hepatitis B virus infection 07/05/2010   History of substance abuse    opiates; cocaine   Hypoglycemia unawareness associated with type 1 diabetes mellitus 11/23/2020   Insomnia 04/13/2015   Major depressive disorder 07/28/2014   Migraine headache 07/05/2010   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  It hasn't happened in a long time."   Smokers' cough 07/05/2010   Tubular adenoma of colon 2018   Type 1 diabetes mellitus with complication 30/86/5784   WBC decreased 03/27/2017  Past Surgical History:  Procedure Laterality Date   CESAREAN SECTION     COLONOSCOPY     TUBAL LIGATION     UPPER GASTROINTESTINAL ENDOSCOPY      Family Psychiatric and Medical History:  Family History  Problem Relation Age of Onset   Arthritis Mother    Diverticulosis Mother    Kidney disease Mother    Hyperlipidemia Mother    Arthritis Father    Kidney disease Father    Kidney cancer Father    Bladder Cancer Sister    Colon cancer Sister 18   Celiac disease Sister    Heart attack Brother    Kidney cancer Paternal Aunt    Diabetes Niece    Hyperlipidemia Other     Anxiety disorder Neg Hx    Bipolar disorder Neg Hx    Depression Neg Hx    Breast cancer Neg Hx    Esophageal cancer Neg Hx    Stomach cancer Neg Hx    Rectal cancer Neg Hx     Social History:  Social History   Socioeconomic History   Marital status: Divorced    Spouse name: Not on file   Number of children: 1   Years of education: 12   Highest education level: High school graduate  Occupational History   Occupation: Disability  Tobacco Use   Smoking status: Every Day    Packs/day: 1.50    Years: 50.00    Total pack years: 75.00    Types: Cigarettes   Smokeless tobacco: Never   Tobacco comments:    Reports she has cut back to a cartoon that now lasts 2 weeks.   Vaping Use   Vaping Use: Former  Substance and Sexual Activity   Alcohol use: Yes    Alcohol/week: 7.0 standard drinks of alcohol    Types: 7 Cans of beer per week    Comment: 1 beer nightly   Drug use: Not Currently    Types: Cocaine    Comment: past opiate and cocaine abuse   Sexual activity: Not Currently    Birth control/protection: Surgical, Post-menopausal  Other Topics Concern   Not on file  Social History Narrative   Divorced, disabled   Lives with parents   Right handed    Social Determinants of Health   Financial Resource Strain: Low Risk  (03/14/2022)   Overall Financial Resource Strain (CARDIA)    Difficulty of Paying Living Expenses: Not hard at all  Food Insecurity: No Food Insecurity (03/14/2022)   Hunger Vital Sign    Worried About Running Out of Food in the Last Year: Never true    Ran Out of Food in the Last Year: Never true  Transportation Needs: No Transportation Needs (03/14/2022)   PRAPARE - Hydrologist (Medical): No    Lack of Transportation (Non-Medical): No  Physical Activity: Sufficiently Active (03/14/2022)   Exercise Vital Sign    Days of Exercise per Week: 5 days    Minutes of Exercise per Session: 30 min  Stress: Stress Concern Present  (03/14/2022)   [place]    Feeling of Stress : To some extent  Social Connections: Socially Integrated (03/14/2022)   Social Connection and Isolation Panel [NHANES]    Frequency of Communication with Friends and Family: More than three times a week    Frequency of Social Gatherings with Friends and Family: More than three times a week  Attends Religious Services: More than 4 times per year    Active Member of Clubs or Organizations: Yes    Attends Archivist Meetings: More than 4 times per year    Marital Status: Married    Allergies:  Allergies  Allergen Reactions   Penicillins Anaphylaxis   Sulfonamide Derivatives Anaphylaxis    Metabolic Disorder Labs: Lab Results  Component Value Date   HGBA1C 6.6 (H) 12/12/2021   No results found for: "PROLACTIN" Lab Results  Component Value Date   CHOL 209 (H) 06/07/2021   TRIG 64.0 06/07/2021   HDL 115.00 06/07/2021   CHOLHDL 2 06/07/2021   VLDL 12.8 06/07/2021   LDLCALC 81 06/07/2021   LDLCALC 84 09/16/2020   Lab Results  Component Value Date   TSH 0.71 06/07/2021   TSH 1.78 06/13/2018    Therapeutic Level Labs: No results found for: "LITHIUM" No results found for: "VALPROATE" No results found for: "CBMZ"  Current Medications: Current Outpatient Medications  Medication Sig Dispense Refill   Accu-Chek Softclix Lancets lancets Use as instructed to check blood sugar 4 times per day dx code E10.65 400 each 1   Alcohol Swabs (B-D SINGLE USE SWABS REGULAR) PADS USE FOUR TIMES DAILY 400 each 2   aspirin EC 81 MG tablet Take 81 mg by mouth every morning.     B-D ULTRAFINE III SHORT PEN 31G X 8 MM MISC SMARTSIG:pre-filled pen syringe SUB-Q Daily 400 each 2   Blood Glucose Calibration (ACCU-CHEK AVIVA) SOLN USE AS DIRECTED 1 each 1   Blood Glucose Monitoring Suppl (ACCU-CHEK AVIVA PLUS) w/Device KIT USE AS DIRECTED 1 kit 0   Continuous Blood Gluc Sensor  (FREESTYLE LIBRE 2 SENSOR) MISC Use to check blood sugar daily 2 each 12   donepezil (ARICEPT) 10 MG tablet TAKE 1 TABLET BY MOUTH AT NIGHT 30 tablet 5   doxycycline (VIBRAMYCIN) 100 MG capsule Take 100 mg by mouth daily.     escitalopram (LEXAPRO) 5 MG tablet Take 1 tablet (5 mg total) by mouth daily. 30 tablet 1   Glucagon (GVOKE HYPOPEN 2-PACK) 1 MG/0.2ML SOAJ Inject 1 mg into the skin as needed for low blood sugar. 0.4 mL 1   glucose blood (ACCU-CHEK AVIVA PLUS) test strip TEST BLOOD SUGAR 4 TIMES  DAILY 400 strip 3   insulin detemir (LEVEMIR FLEXTOUCH) 100 UNIT/ML FlexPen Inject 3 units in the morning and 6 units at bedtime (Patient taking differently: Inject 3 units in the morning and 7 units at bedtime) 15 mL 5   insulin lispro (HUMALOG KWIKPEN) 100 UNIT/ML KwikPen INJECT SUBCUTANEOUSLY 1 TO  3 UNITS 3 TIMES DAILY FOR  LARGE MEALS 15 mL 3   Insulin Pen Needle 32G X 4 MM MISC Use inject insulin tid 300 each 2   lipase/protease/amylase (CREON) 36000 UNITS CPEP capsule Take 2 capsules by mouth three times a day with meals and 1 capsule by mouth twice daily with snacks 240 capsule 11   lovastatin (MEVACOR) 40 MG tablet TAKE 1 TABLET BY MOUTH AT  BEDTIME 90 tablet 3   Magnesium 500 MG TABS Take 1 tablet by mouth daily.     Omega-3 Fatty Acids (FISH OIL) 1000 MG CAPS Take 1 capsule by mouth daily.     omeprazole (PRILOSEC) 40 MG capsule TAKE 1 CAPSULE(40 MG) BY MOUTH IN THE MORNING AND AT BEDTIME 180 capsule 0   triamcinolone cream (KENALOG) 0.1 %      umeclidinium-vilanterol (ANORO ELLIPTA) 62.5-25 MCG/ACT AEPB INHALE 1 PUFF  INTO THE LUNGS DAILY 60 each 5   Clobetasol Propionate 0.05 % shampoo Apply topically. (Patient not taking: Reported on 04/06/2022)     fexofenadine (ALLEGRA) 180 MG tablet Take 180 mg by mouth daily. (Patient not taking: Reported on 04/06/2022)     hyoscyamine (LEVBID) 0.375 MG 12 hr tablet Take 1 tablet (0.375 mg total) by mouth 2 (two) times daily. 60 tablet 11    lamoTRIgine (LAMICTAL) 100 MG tablet Take 1 tablet (100 mg total) by mouth 2 (two) times daily. 180 tablet 0   Multiple Vitamin (MULTIVITAMIN) tablet Take 1 tablet by mouth daily. Reported on 11/22/2015 (Patient not taking: Reported on 04/06/2022)     traZODone (DESYREL) 100 MG tablet Take 2 tablets (200 mg total) by mouth at bedtime. 180 tablet 0   vitamin E 1000 UNIT capsule Take 1,000 Units by mouth daily. (Patient not taking: Reported on 04/06/2022)     No current facility-administered medications for this visit.     Musculoskeletal: Strength & Muscle Tone: decreased Gait & Station: normal Patient leans: Front  Psychiatric Specialty Exam: Review of Systems  There were no vitals taken for this visit.There is no height or weight on file to calculate BMI.  General Appearance: Casual and Fairly Groomed  Eye Contact:  Fair  Speech:  Clear and Coherent and Normal Rate  Volume:  Normal  Mood:  Anxious, Depressed, and Irritable  Affect:  Congruent and Tearful  Thought Process:  Goal Directed, Linear, and Descriptions of Associations: Intact  Orientation:  Full (Time, Place, and Person)  Thought Content: Logical   Suicidal Thoughts:  No  Homicidal Thoughts:  No  Memory:  Immediate;   Good  Judgement:  Fair  Insight:  Present  Psychomotor Activity:  Normal  Concentration:  Concentration: Good  Recall:  Good  Fund of Knowledge: Good  Language: Good  Akathisia:  No  Handed:  Right  AIMS (if indicated): not done  Assets:  Communication Skills Desire for Improvement Financial Resources/Insurance [place] Talents/Skills Transportation Vocational/Educational  ADL's:  Intact  Cognition: WNL  Sleep:  Poor   Screenings: AUDIT    Flowsheet Row Clinical Support from 03/14/2022 in [place] at [facility]  Alcohol Use Disorder Identification Test Final Score (AUDIT) 4      GAD-7    Flowsheet Row Office Visit from 12/16/2020 in [place] at [facility] Visit from 06/17/2019 in [facility] from 11/27/2017 in [facility]  Total GAD-7 Score 17 19 18       [place] Office Visit from 12/03/2020 in [facility] from 05/30/2017 in [facility]  Total Score (max 30 points ) 22 27      PHQ2-9    [place] Visit from 04/06/2022 in [place] from 03/14/2022 in [place] at [facility] Visit from 12/23/2021 in [place] at [place] from 12/01/2021 in [facility] Office Visit from 10/06/2021 in [facility]  PHQ-2 Total Score 6 2 2 6 5   PHQ-9 Total Score 24 2 2 20 15       [place] Office Visit from 04/06/2022 in [facility] Office Visit from 12/01/2021 in [facility] Office Visit from 10/06/2021 in [place] Error: Q3, 4, or  5 should not be populated when Q2 is No Error: Q3, 4, or 5 should not be populated when Q2 is No No Risk        Assessment and Plan:    Medication management with supportive therapy. Risks and benefits, side effects and alternative treatment options discussed with patient. Pt was given an opportunity to ask questions about medication, illness, and treatment. All current psychiatric medications have been reviewed and discussed with the patient and adjusted as clinically appropriate. The patient has been provided an accurate and updated list of the medications being now prescribed. Pt verbalized understanding and verbal consent obtained for treatment.   Pt is aware that these meds carry a teratogenic risk. Pt will discuss plan of action if  she does or plans to become pregnant in the future.  Status of current problems: ongoing depression and anxiety  Meds: Laryah has always been fearful of trying new meds and often reports SE right after starting them. She will often take the medication for a few days and then stop Recently she has tried Vistaril, Cymbalta, Remeron, Lexapro.  She is willing to try Lexapro 34m po qD   Labs: none  Therapy: brief supportive therapy provided. Discussed psychosocial stressors in detail.       F/up in 2 weeks months or sooner if needed  The duration of this appointment visit was 20 minutes of face-to-face time with the patient.  Greater than 50% of this time was spent in counseling, explanation of  diagnosis, planning of further management, and coordination of care    Collaboration of Care: Collaboration of Care: Patient refused AEB therapy  Patient/Guardian was advised Release of Information must be obtained prior to any record release in order to collaborate their care with an outside provider. Patient/Guardian was advised if they have not already done so to contact the registration department to sign all necessary forms in order for [facility] release information regarding their care.   Consent: Patient/Guardian gives verbal consent for treatment and assignment of benefits for services provided during this visit. Patient/Guardian expressed understanding and agreed to proceed.    SCharlcie Cradle MD 04/06/2022, 12:13 PM

## 2022-04-07 ENCOUNTER — Other Ambulatory Visit (HOSPITAL_COMMUNITY): Payer: Self-pay

## 2022-04-16 ENCOUNTER — Other Ambulatory Visit: Payer: Self-pay | Admitting: Internal Medicine

## 2022-04-20 ENCOUNTER — Telehealth (HOSPITAL_BASED_OUTPATIENT_CLINIC_OR_DEPARTMENT_OTHER): Payer: Medicare Other | Admitting: Psychiatry

## 2022-04-20 DIAGNOSIS — F332 Major depressive disorder, recurrent severe without psychotic features: Secondary | ICD-10-CM

## 2022-04-20 DIAGNOSIS — F4010 Social phobia, unspecified: Secondary | ICD-10-CM

## 2022-04-20 DIAGNOSIS — G4701 Insomnia due to medical condition: Secondary | ICD-10-CM | POA: Diagnosis not present

## 2022-04-20 NOTE — Progress Notes (Signed)
Virtual Visit via Phone Note  I connected with Leslie Lane on 04/20/22 at 10:15 AM EDT by phone. We were unable to connect via a video enabled telemedicine application and verified that I am speaking with the correct person using two identifiers.  Location: Patient: home Provider: office   I discussed the limitations of evaluation and management by telemedicine and the availability of in person appointments. The patient expressed understanding and agreed to proceed.  History of Present Illness: Kyndell has been taking Lexapro for about 2 weeks now. She states she takes it at night before going to bed. It seems like she is sleeping better and feeling calmer when going out. Terrea is getting about 6-7 hrs/sleep each night. She continues to nap during the day when she can. When she goes out in public she is not as irritable and able to get some things go before it gets too upsetting. Her depression is a little better even though she has only been taking it for 2 weeks.Starlyn denies SI/HI.  She denies any side effects and wants to continue it.    Observations/Objective: Psychiatric Specialty Exam:  General Appearance: unable to assess  Eye Contact:  unable to assess  Speech:  Clear and Coherent and Normal Rate  Volume:  Normal  Mood:  Anxious and Depressed  Affect:  Congruent and brighter and calmer than previous visits  Thought Process:  Goal Directed, Linear, and Descriptions of Associations: Intact  Orientation:  Full (Time, Place, and Person)  Thought Content:  Logical  Suicidal Thoughts:  No  Homicidal Thoughts:  No  Memory:  Immediate;   Good  Judgement:  Good  Insight:  Good  Psychomotor Activity: unable to assess  Concentration:  Concentration: Good  Recall:  Good  Fund of Knowledge:  Good  Language:  Good  Akathisia:  unable to assess  Handed:  unable to assess  AIMS (if indicated):     Assets:  Communication Skills Desire for Improvement Financial  Resources/Insurance Housing Resilience Social Support Talents/Skills Transportation Vocational/Educational  ADL's:  unable to assess  Cognition:  WNL  Sleep:         Assessment and Plan:     04/06/2022   11:55 AM 03/14/2022    2:10 PM 12/23/2021    1:48 PM 12/01/2021    2:43 PM 10/06/2021   10:30 AM  Depression screen PHQ 2/9  Decreased Interest '3 1 1 3 3  '$ Down, Depressed, Hopeless '3 1 1 3 2  '$ PHQ - 2 Score '6 2 2 6 5  '$ Altered sleeping 3 0 0 0 2  Tired, decreased energy 3 0 0 3 3  Change in appetite 3 0 0 3 0  Feeling bad or failure about yourself  3 0 0 3 2  Trouble concentrating 3 0 0 3 3  Moving slowly or fidgety/restless 0 0 0 0 0  Suicidal thoughts 3 0 0 2 0  PHQ-9 Score '24 2 2 20 15  '$ Difficult doing work/chores Extremely dIfficult Not difficult at all Not difficult at all Extremely dIfficult Very difficult    [facility] Visit from 04/06/2022 in [facility] Office Visit from 12/01/2021 in [facility] Office Visit from 10/06/2021 in [facility]  C-SSRS RISK CATEGORY Error: Q3, 4, or 5 should not be populated when Q2 is No Error: Q3, 4, or 5 should not be populated when Q2 is No No Risk  Pt is aware that these meds carry a teratogenic risk. Pt will discuss plan of action if she does or plans to become pregnant in the future.  Status of current problems: mild mprovment in depression and social anxiety  Meds: Continue Lexapro '5mg'$  po qD Lamictal '100mg'$  BID Trazodone '200mg'$  po qHS 1. Major depressive disorder, recurrent, severe without psychotic features ([place])  2. Social anxiety disorder  3. Insomnia due to medical condition     Labs: none    Therapy: brief supportive therapy provided. Discussed psychosocial stressors in detail.      Collaboration of Care: Other none  Patient/Guardian was advised Release of Information must be  obtained prior to any record release in order to collaborate their care with an outside provider. Patient/Guardian was advised if they have not already done so to contact the registration department to sign all necessary forms in order for [place] to release information regarding their care.   Consent: Patient/Guardian gives verbal consent for treatment and assignment of benefits for services provided during this visit. Patient/Guardian expressed understanding and agreed to proceed.     .  Follow Up Instructions: Follow up in 2 months or sooner if needed- in person visit    I discussed the assessment and treatment plan with the patient. The patient was provided an opportunity to ask questions and all were answered. The patient agreed with the plan and demonstrated an understanding of the instructions.   The patient was advised to call back or seek an in-person evaluation if the symptoms worsen or if the condition fails to improve as anticipated.  I provided 7 minutes of non-face-to-face time during this encounter.   Charlcie Cradle, MD

## 2022-04-24 ENCOUNTER — Encounter: Payer: Self-pay | Admitting: Endocrinology

## 2022-04-24 ENCOUNTER — Ambulatory Visit: Payer: Medicare Other | Admitting: Endocrinology

## 2022-04-24 VITALS — BP 152/58 | HR 70 | Ht 63.5 in | Wt 106.8 lb

## 2022-04-24 DIAGNOSIS — Z23 Encounter for immunization: Secondary | ICD-10-CM

## 2022-04-24 DIAGNOSIS — E1065 Type 1 diabetes mellitus with hyperglycemia: Secondary | ICD-10-CM

## 2022-04-24 DIAGNOSIS — L932 Other local lupus erythematosus: Secondary | ICD-10-CM | POA: Diagnosis not present

## 2022-04-24 DIAGNOSIS — E10649 Type 1 diabetes mellitus with hypoglycemia without coma: Secondary | ICD-10-CM | POA: Diagnosis not present

## 2022-04-24 LAB — POCT GLYCOSYLATED HEMOGLOBIN (HGB A1C): Hemoglobin A1C: 7 % — AB (ref 4.0–5.6)

## 2022-04-24 NOTE — Patient Instructions (Addendum)
Call [facility] if eligible for [employer] 3  Take 2 Humalog at supper daily

## 2022-04-24 NOTE — Progress Notes (Signed)
Patient ID: Leslie Lane, female   DOB: [birth year], 66 y.o.   MRN: [id number]  Reason for Appointment: Endocrinology follow-up   History of Present Illness    Diagnosis: Type 1 DIABETES MELITUS, diagnosed 1967      She has had labile blood sugar control over the last several years even though A1c has been usually around 7% She has had less lability and hypoglycemia with taking b.i.d. Lantus compared once a day She has been very sensitive to fast acting insulin and frequently does not require mealtime coverage She cannot tolerate Toujeo because of reported episode of headache, bloating and nausea with the first dose  RECENT history:  Insulin regimen: LEVEMIR 3 units in the morning and 9 at 11 pm HUMALOG 1-2 units up to 3 times a day  Current blood sugar patterns, control and problems identified:  Her A1c is 7 compared to 6  She has had overall significantly high readings than before This averaging about 180 on her sensor and previously only about 140 She is consistently before starting Creon supplements With this her blood sugars appear to be fairly consistently going up significantly after her evening meal At that time she is usually eating about 2 pieces of corn bread and a sugar-free peanut butter cookie and sometimes additional starch but not taking any meal time insulin  Also overnight blood sugars are highly variable but not consistently high She is mostly taking Humalog as needed when blood sugars go up Compared to before she has had much less hypoglycemia and only occasionally around 4-5 PM possibly even when she is more active  However she thinks her freestyle Elenor Legato is not accurate and today at the same time it was reading about 40 mg lower than the actual reading on her Accu-Chek Accu-Chek average is 145 for 30 days and from the [place] it is 180 She has increased her LEVEMIR by 3 units in the evening mostly because she thinks she was eating more in the  evening   Mealtimes: Breakfast 8-8.30 am, lunch 12 noon , dinner 5 pm  Hypoglycemia: Symptoms may be absent with low blood sugars and does not have early warning symptoms. Gets confused and she frequently depends on her mother to recognize low sugars and treat them.  Her mother knows how to give Glucagon injection  She will treat her low blood sugars usually with juice, does carry glucose tablets when not at home   Blood sugar patterns as follows from [facility] to download for the last 2 weeks  Blood sugars are showing moderate variability with GV 34  However blood sugars are tending to be low normal late afternoon and significantly higher late evening through the night  OVERNIGHT blood sugars start off averaging over 200 and generally come down significantly by 6 AM but not always to normal; no hypoglycemia overnight except rarely reading may be 63  Premeal blood sugars are mostly high at lunchtime but near normal or low normal at dinnertime POSTPRANDIAL readings are usually going up significantly after dinner but only variably after breakfast Blood sugars are mostly not rising the afternoon and tending to be generally much lower after about 3 PM  Time in range is 50% compared to 64  CGM use % of time 96  2-week average/GV 180/34  Time in range      50%  % Time Above 180 34  % Time above 250 15  % Time Below 70 1     PRE-MEAL Fasting  Midday Dinner Bedtime Overall  Glucose range:       Averages: 161 200 107     POST-MEAL PC Breakfast PC Lunch PC Dinner  Glucose range:     Averages:   226   Previously  CGM use % of time   2-week average/GV 141/49  Time in range       64%  % Time Above 180 14+9  % Time above 250   % Time Below 70 11+2   PRE-MEAL Fasting Lunch Dinner Bedtime Overall  Glucose range:       Mean/median: 143  102 158 140   POST-MEAL PC Breakfast PC Lunch PC Dinner  Glucose range:     Mean/median: 146 148 126    Wt Readings from Last 3 Encounters:   04/24/22 106 lb 12.8 oz (48.4 kg)  03/14/22 106 lb (48.1 kg)  01/18/22 105 lb (47.6 kg)    Lab Results  Component Value Date   HGBA1C 7.0 (A) 04/24/2022   HGBA1C 6.6 (H) 12/12/2021   HGBA1C 6.5 09/06/2021   Lab Results  Component Value Date   MICROALBUR 2.4 (H) 06/07/2021   LDLCALC 81 06/07/2021   CREATININE 0.77 09/06/2021        Allergies as of 04/24/2022       Reactions   Penicillins Anaphylaxis   Sulfonamide Derivatives Anaphylaxis        Medication List        Accurate as of April 24, 2022  2:16 PM. If you have any questions, ask your nurse or doctor.          Accu-Chek Aviva Plus test strip Generic drug: glucose blood TEST BLOOD SUGAR 4 TIMES  DAILY   Accu-Chek Aviva Plus w/Device Kit USE AS DIRECTED   Accu-Chek Aviva Soln USE AS DIRECTED   Accu-Chek Softclix Lancets lancets Use as instructed to check blood sugar 4 times per day dx code E10.65   Anoro Ellipta 62.5-25 MCG/ACT Aepb Generic drug: umeclidinium-vilanterol INHALE 1 PUFF INTO THE LUNGS DAILY   aspirin EC 81 MG tablet Take 81 mg by mouth every morning.   B-D SINGLE USE SWABS REGULAR Pads USE FOUR TIMES DAILY   Clobetasol Propionate 0.05 % shampoo Apply topically.   donepezil 10 MG tablet Commonly known as: ARICEPT TAKE 1 TABLET BY MOUTH AT NIGHT   doxycycline 100 MG capsule Commonly known as: VIBRAMYCIN Take 100 mg by mouth daily.   escitalopram 5 MG tablet Commonly known as: Lexapro Take 1 tablet (5 mg total) by mouth daily.   fexofenadine 180 MG tablet Commonly known as: ALLEGRA Take 180 mg by mouth daily.   Fish Oil 1000 MG Caps Take 1 capsule by mouth daily.   FreeStyle Libre 2 Sensor Misc Use to check blood sugar daily   Gvoke HypoPen 2-Pack 1 MG/0.2ML Soaj Generic drug: Glucagon Inject 1 mg into the skin as needed for low blood sugar.   hyoscyamine 0.375 MG 12 hr tablet Commonly known as: Levbid Take 1 tablet (0.375 mg total) by mouth 2 (two) times  daily.   insulin lispro 100 UNIT/ML KwikPen Commonly known as: HumaLOG KwikPen INJECT SUBCUTANEOUSLY 1 TO  3 UNITS 3 TIMES DAILY FOR  LARGE MEALS   Insulin Pen Needle 32G X 4 MM Misc Use inject insulin tid   B-D ULTRAFINE III SHORT PEN 31G X 8 MM Misc Generic drug: Insulin Pen Needle SMARTSIG:pre-filled pen syringe SUB-Q Daily   lamoTRIgine 100 MG tablet Commonly known as: LAMICTAL Take 1 tablet (100  mg total) by mouth 2 (two) times daily.   Levemir FlexTouch 100 UNIT/ML FlexPen Generic drug: insulin detemir Inject 3 units in the morning and 6 units at bedtime What changed: additional instructions   lipase/protease/amylase 36000 UNITS Cpep capsule Commonly known as: Creon Take 2 capsules by mouth three times a day with meals and 1 capsule by mouth twice daily with snacks   lovastatin 40 MG tablet Commonly known as: MEVACOR TAKE 1 TABLET BY MOUTH AT  BEDTIME   Magnesium 500 MG Tabs Take 1 tablet by mouth daily.   multivitamin tablet Take 1 tablet by mouth daily. Reported on 11/22/2015   omeprazole 40 MG capsule Commonly known as: PRILOSEC TAKE 1 CAPSULE(40 MG) BY MOUTH IN THE MORNING AND AT BEDTIME   traZODone 100 MG tablet Commonly known as: DESYREL Take 2 tablets (200 mg total) by mouth at bedtime.   triamcinolone cream 0.1 % Commonly known as: KENALOG   vitamin E 1000 UNIT capsule Take 1,000 Units by mouth daily.        Allergies:  Allergies  Allergen Reactions   Penicillins Anaphylaxis   Sulfonamide Derivatives Anaphylaxis    Past Medical History:  Diagnosis Date   Abdominal pain 02/28/2016   Abnormal CT scan, bladder    Noted 07/28/13 CT - s/p uro eval 08/2013 [facility] -    Acquired trigger finger of left ring finger 02/18/2019   Injected February 18, 2019 and March 2022   Allergic rhinitis    Anemia    Arthritis    Bipolar disorder 07/05/2010   Cataract    Chronic lower back pain 07/05/2010   Chronic neck pain    Diabetic gastroparesis  associated with type 1 diabetes mellitus 10/24/2016   Fibromyalgia    Generalized anxiety disorder 04/27/2014   GERD (gastroesophageal reflux disease) 07/05/2010   Hepatitis B virus infection 07/05/2010   History of substance abuse    opiates; cocaine   Hypoglycemia unawareness associated with type 1 diabetes mellitus 11/23/2020   Insomnia 04/13/2015   Major depressive disorder 07/28/2014   Migraine headache 07/05/2010   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  It hasn't happened in a long time."   Smokers' cough 07/05/2010   Tubular adenoma of colon 2018   Type 1 diabetes mellitus with complication 44/08/4740   WBC decreased 03/27/2017    Past Surgical History:  Procedure Laterality Date   CESAREAN SECTION     COLONOSCOPY     TUBAL LIGATION     UPPER GASTROINTESTINAL ENDOSCOPY      Family History  Problem Relation Age of Onset   Arthritis Mother    Diverticulosis Mother    Kidney disease Mother    Hyperlipidemia Mother    Arthritis Father    Kidney disease Father    Kidney cancer Father    Bladder Cancer Sister    Colon cancer Sister 21   Celiac disease Sister    Heart attack Brother    Kidney cancer Paternal Aunt    Diabetes Niece    Hyperlipidemia Other    Anxiety disorder Neg Hx    Bipolar disorder Neg Hx    Depression Neg Hx    Breast cancer Neg Hx    Esophageal  cancer Neg Hx    Stomach cancer Neg Hx    Rectal cancer Neg Hx     Social History:  reports that she has been smoking cigarettes. She has a 75.00 pack-year smoking history. She has never used smokeless tobacco. She reports current alcohol use of about 7.0 standard drinks of alcohol per week. She reports that she does not  currently use drugs after having used the following drugs: Cocaine.  Review of Systems:   Had gastroparesis previously treated with Reglan  Also being followed by gastroenterologist for abdominal pain and pancreatic insufficiency For diarrhea she is taking Creon    NEUROPATHY: She has history of burning in her legs and feet; gabapentin did not work and caused her to be sleepy She has been prescribed Cymbalta with some benefit   DEPRESSION: She has had long-standing depression and anxiety on long-term treatment  HYPERLIPIDEMIA: The lipid abnormality consists of elevated LDL   Is  on lovastatin 40 mg with  good control, prescribed by PCP   Lab Results  Component Value Date   CHOL 209 (H) 06/07/2021   HDL 115.00 06/07/2021   LDLCALC 81 06/07/2021   LDLDIRECT 101.7 03/26/2012   TRIG 64.0 06/07/2021   CHOLHDL 2 06/07/2021    Memory difficulties: She was given Aricept by the neurologist, is taking 10 mg    Examination:   BP (!) 152/58   Pulse 70   Ht 5' 3.5" (1.613 m)   Wt 106 lb 12.8 oz (48.4 kg)   SpO2 94%   BMI 18.62 kg/m   Body mass index is 18.62 kg/m.    ASSESSMENT/ PLAN:   Diabetes type 1 longstanding  See history of present illness for detailed discussion of current insulin, blood sugar patterns, problems identified  A1c has been now 7% and higher  Her blood sugar patterns have changed significantly and may be she is absorbing nutrients including carbohydrates better Also eating significant amount of carbohydrate at dinnertime along with the peanut butter sugar-free cookie causing blood sugars to be averaging well over 200 in the evenings Despite this she does not take any proactive Humalog before eating dinner She only increase her Levemir in the evening which appears to be improving her fasting readings  Also periodically appears to have inaccurate readings on her Elenor Legato and today it was appearing to be higher than expected Reviewed the periods of highs and  lows and how to adjust her insulin based on diet and activity level Discussion we will have to adjust her mealtime insulin based on carbohydrates and blood sugar patterns  Recommendations:  She needs to take consistently about 2 units of Humalog at the start of dinner If she is able to get her blood sugars to be under 180 after eating this should be continued at the same dose She can adjust the dose of 1 or 2 units if she is eating less carbohydrate or more is also if she is eating more sweets If she is eating very little carbohydrates she can take just 1 unit in the evening Explained to her that if she is eating a high carbohydrate breakfast also she will need to take at least 1 unit of Humalog in the morning No change in Levemir She will call her DME supplier to get the [employer] 3 if eligible, currently is reluctant to use the Dexcom as she previously could not understand how to do it  Total visit time including counseling = 30 minutes  Patient Instructions  Call [facility]  supplier if eligible for [employer] 3  Take 2 Humalog at supper daily Flu vaccine given     [name] 04/24/2022, 2:16 PM

## 2022-05-01 ENCOUNTER — Encounter: Payer: Self-pay | Admitting: Endocrinology

## 2022-05-12 ENCOUNTER — Other Ambulatory Visit (HOSPITAL_COMMUNITY): Payer: Self-pay

## 2022-05-12 ENCOUNTER — Other Ambulatory Visit: Payer: Self-pay

## 2022-05-12 ENCOUNTER — Other Ambulatory Visit: Payer: Self-pay | Admitting: Endocrinology

## 2022-05-12 DIAGNOSIS — E1065 Type 1 diabetes mellitus with hyperglycemia: Secondary | ICD-10-CM

## 2022-05-12 MED ORDER — GVOKE HYPOPEN 2-PACK 1 MG/0.2ML ~~LOC~~ SOAJ: 1.0000 mg | SUBCUTANEOUS | 2 refills | Status: DC | PRN

## 2022-05-15 ENCOUNTER — Other Ambulatory Visit (HOSPITAL_COMMUNITY): Payer: Self-pay

## 2022-06-01 ENCOUNTER — Other Ambulatory Visit (HOSPITAL_COMMUNITY): Payer: Self-pay | Admitting: Psychiatry

## 2022-06-01 DIAGNOSIS — F332 Major depressive disorder, recurrent severe without psychotic features: Secondary | ICD-10-CM

## 2022-06-01 DIAGNOSIS — F4010 Social phobia, unspecified: Secondary | ICD-10-CM

## 2022-06-01 DIAGNOSIS — F4322 Adjustment disorder with anxiety: Secondary | ICD-10-CM

## 2022-06-08 ENCOUNTER — Encounter (HOSPITAL_COMMUNITY): Payer: Self-pay

## 2022-06-08 ENCOUNTER — Ambulatory Visit (HOSPITAL_COMMUNITY): Payer: Medicare Other | Admitting: Psychiatry

## 2022-06-14 ENCOUNTER — Encounter (HOSPITAL_COMMUNITY): Payer: Self-pay | Admitting: Emergency Medicine

## 2022-06-14 ENCOUNTER — Emergency Department (HOSPITAL_COMMUNITY)
Admission: EM | Admit: 2022-06-14 | Discharge: 2022-06-14 | Disposition: A | Discharge status: Home / Self Care | Payer: Medicare Other | Attending: Emergency Medicine | Admitting: Emergency Medicine

## 2022-06-14 ENCOUNTER — Other Ambulatory Visit: Payer: Self-pay

## 2022-06-14 ENCOUNTER — Emergency Department (HOSPITAL_COMMUNITY): Payer: Medicare Other

## 2022-06-14 DIAGNOSIS — Z794 Long term (current) use of insulin: Secondary | ICD-10-CM | POA: Diagnosis not present

## 2022-06-14 DIAGNOSIS — M79605 Pain in left leg: Secondary | ICD-10-CM | POA: Insufficient documentation

## 2022-06-14 DIAGNOSIS — M25562 Pain in left knee: Secondary | ICD-10-CM | POA: Diagnosis not present

## 2022-06-14 DIAGNOSIS — E119 Type 2 diabetes mellitus without complications: Secondary | ICD-10-CM | POA: Insufficient documentation

## 2022-06-14 DIAGNOSIS — Z7982 Long term (current) use of aspirin: Secondary | ICD-10-CM | POA: Diagnosis not present

## 2022-06-14 MED ORDER — OXYCODONE-ACETAMINOPHEN 5-325 MG PO TABS
1.0000 | ORAL_TABLET | Freq: Once | ORAL | Status: AC
  Administered 2022-06-14: 1 via ORAL
  Filled 2022-06-14: qty 1

## 2022-06-14 MED ORDER — GABAPENTIN 100 MG PO CAPS: 100.0000 mg | ORAL_CAPSULE | Freq: Three times a day (TID) | ORAL | 0 refills | Status: DC

## 2022-06-14 MED ORDER — GABAPENTIN 100 MG PO CAPS
100.0000 mg | ORAL_CAPSULE | Freq: Once | ORAL | Status: AC
  Administered 2022-06-14: 100 mg via ORAL
  Filled 2022-06-14: qty 1

## 2022-06-14 NOTE — ED Notes (Signed)
Provider Dr Doren Custard notified that This RN called ultrasound and they say that the vascular ultrasound team is gone for the day and will not return until the am. she says that she is unsure if they have anyone on call or not

## 2022-06-14 NOTE — ED Provider Triage Note (Signed)
Emergency Medicine Provider Triage Evaluation Note  Leslie Lane , a 66 y.o. female  was evaluated in triage.  Pt complains of left leg swelling about 6 months ago and saw PCP for issue.  She states they performed hip x-ray that was negative.  Reports it feels like her whole leg is swollen and painful.  She states most of the pain is behind her knee when she bends it towards herself.    Review of Systems  Positive: See above Negative: See above  Physical Exam  BP (!) 189/68   Pulse 78   Temp 98.2 F (36.8 C) (Oral)   Resp 18   Ht 5' 3.5" (1.613 m)   Wt 48 kg   SpO2 98%   BMI 18.45 kg/m  Gen:   Awake, no distress, she appears anxious    Resp:  Normal effort  MSK:   Moves extremities without difficulty, pain with flexion of left knee, no erythema, swelling, or increased warmth to left leg Other:  Negative Homan's sign left leg; patient ambulated in triage without difficulty  Medical Decision Making  Medically screening exam initiated at 3:23 PM.  Appropriate orders placed.  TASHEA OTHMAN was informed that the remainder of the evaluation will be completed by another provider, this initial triage assessment does not replace that evaluation, and the importance of remaining in the ED until their evaluation is complete.     Theressa Stamps R, [place] 06/14/22 1529

## 2022-06-14 NOTE — ED Provider Notes (Signed)
[facility] EMERGENCY DEPARTMENT Provider Note   CSN: [id number] Arrival date & time: 06/14/22  1433     History  Chief Complaint  Patient presents with   Leg Pain    Leslie Lane is a 66 y.o. female.   Leg Pain Patient presents for left leg pain.  Medical history includes DM, migraine headaches, GERD, chronic pain, anxiety, depression, fibromyalgia, bipolar disorder, neuropathy.  She has had left leg pain over the past year.  She states that she has been working with her primary care doctor for this.  She takes Goody powders, ibuprofen, and Tylenol for management of this pain.  Last night, pain became severe.  She felt like her left leg was swollen, although this has since resolved.  She describes distribution of pain is throughout her left leg.  At times it will migrate.     Home Medications Prior to Admission medications   Medication Sig Start Date End Date Taking? Authorizing Provider  gabapentin (NEURONTIN) 100 MG capsule Take 1 capsule (100 mg total) by mouth 3 (three) times daily. 06/14/22 07/14/22 Yes Godfrey Pick, MD  Accu-Chek Softclix Lancets lancets Use as instructed to check blood sugar 4 times per day dx code E10.65 01/28/20   [name], MD  Alcohol Swabs (B-D SINGLE USE SWABS REGULAR) PADS USE FOUR TIMES DAILY 01/28/20   [name], MD  aspirin EC 81 MG tablet Take 81 mg by mouth every morning.    [provider]  B-D ULTRAFINE III SHORT PEN 31G X 8 MM MISC SMARTSIG:pre-filled pen syringe SUB-Q Daily 12/09/21   [name], MD  Blood Glucose Calibration (ACCU-CHEK AVIVA) SOLN USE AS DIRECTED 01/28/20   [name], MD  Blood Glucose Monitoring Suppl (ACCU-CHEK AVIVA PLUS) w/Device KIT USE AS DIRECTED 12/19/16   [name], MD  Clobetasol Propionate 0.05 % shampoo Apply topically. Patient not taking: Reported on 04/06/2022 11/16/21   [provider]  Continuous Blood Gluc Sensor (FREESTYLE LIBRE 2 SENSOR) MISC Use to check blood sugar  daily 09/08/21   [name], MD  donepezil (ARICEPT) 10 MG tablet TAKE 1 TABLET BY MOUTH AT NIGHT 01/26/22   Rondel Jumbo, PA-C  doxycycline (VIBRAMYCIN) 100 MG capsule Take 100 mg by mouth daily. 01/11/22   [provider]  escitalopram (LEXAPRO) 5 MG tablet TAKE 1 TABLET BY MOUTH DAILY 06/01/22   Charlcie Cradle, MD  fexofenadine (ALLEGRA) 180 MG tablet Take 180 mg by mouth daily. Patient not taking: Reported on 04/06/2022    [provider]  Glucagon (GVOKE HYPOPEN 2-PACK) 1 MG/0.2ML SOAJ Inject 1 mg into the skin as needed for low blood sugar. 05/12/22   [name], MD  glucose blood (ACCU-CHEK AVIVA PLUS) test strip TEST BLOOD SUGAR 4 TIMES  DAILY 10/03/21   [name], MD  hyoscyamine (LEVBID) 0.375 MG 12 hr tablet Take 1 tablet (0.375 mg total) by mouth 2 (two) times daily. 10/19/21   Ladene Artist, MD  insulin detemir (LEVEMIR FLEXTOUCH) 100 UNIT/ML FlexPen Inject 3 units in the morning and 6 units at bedtime Patient taking differently: Inject 3 units in the morning and 7 units at bedtime 12/09/20   [name], MD  insulin lispro (HUMALOG KWIKPEN) 100 UNIT/ML KwikPen INJECT SUBCUTANEOUSLY 1 TO  3 UNITS 3 TIMES DAILY FOR  LARGE MEALS 02/08/22   [name], MD  Insulin Pen Needle 32G X 4 MM MISC Use inject insulin tid 03/03/21   [name], MD  lamoTRIgine (LAMICTAL) 100  MG tablet Take 1 tablet (100 mg total) by mouth 2 (two) times daily. 04/06/22   Charlcie Cradle, MD  lipase/protease/amylase (CREON) [id number] UNITS CPEP capsule Take 2 capsules by mouth three times a day with meals and 1 capsule by mouth twice daily with snacks 10/19/21   Ladene Artist, MD  lovastatin (MEVACOR) 40 MG tablet TAKE 1 TABLET BY MOUTH AT  BEDTIME 12/02/21   [name], MD  Magnesium 500 MG TABS Take 1 tablet by mouth daily.    [provider]  Multiple Vitamin (MULTIVITAMIN) tablet Take 1 tablet by mouth daily. Reported on 11/22/2015 Patient not taking: Reported on 04/06/2022     [provider]  Omega-3 Fatty Acids (FISH OIL) 1000 MG CAPS Take 1 capsule by mouth daily.    [provider]  omeprazole (PRILOSEC) 40 MG capsule TAKE 1 CAPSULE(40 MG) BY MOUTH IN THE MORNING AND AT BEDTIME 04/17/22   [name], MD  traZODone (DESYREL) 100 MG tablet Take 2 tablets (200 mg total) by mouth at bedtime. 04/06/22   Charlcie Cradle, MD  triamcinolone cream (KENALOG) 0.1 %  12/04/21   [provider]  umeclidinium-vilanterol [facility] ELLIPTA) 62.5-25 MCG/ACT AEPB INHALE 1 PUFF INTO THE LUNGS DAILY 01/04/22   Hunsucker, Bonna Gains, MD  vitamin E 1000 UNIT capsule Take 1,000 Units by mouth daily. Patient not taking: Reported on 04/06/2022    [provider]      Allergies    Penicillins and Sulfonamide derivatives    Review of Systems   Review of Systems  Musculoskeletal:  Positive for arthralgias and myalgias.  All other systems reviewed and are negative.   Physical Exam Updated Vital Signs BP (!) 174/70   Pulse 74   Temp 98 F (36.7 C)   Resp 18   Ht 5' 3.5" (1.613 m)   Wt 48 kg   SpO2 99%   BMI 18.45 kg/m  Physical Exam Vitals and nursing note reviewed.  Constitutional:      General: She is not in acute distress.    Appearance: Normal appearance. She is well-developed. She is not ill-appearing, toxic-appearing or diaphoretic.  HENT:     Head: Normocephalic and atraumatic.     Right Ear: External ear normal.     Left Ear: External ear normal.     Nose: Nose normal.  Eyes:     Extraocular Movements: Extraocular movements intact.     Conjunctiva/sclera: Conjunctivae normal.  Cardiovascular:     Rate and Rhythm: Normal rate and regular rhythm.  Pulmonary:     Effort: Pulmonary effort is normal. No respiratory distress.  Abdominal:     General: There is no distension.     Palpations: Abdomen is soft.     Tenderness: There is no abdominal tenderness.  Musculoskeletal:        General: No swelling or tenderness. Normal  range of motion.     Cervical back: Normal range of motion and neck supple.     Right lower leg: No edema.     Left lower leg: No edema.  Skin:    General: Skin is warm and dry.     Capillary Refill: Capillary refill takes less than 2 seconds.     Coloration: Skin is not jaundiced or pale.  Neurological:     General: No focal deficit present.     Mental Status: She is alert and oriented to person, place, and time.     Cranial Nerves: No cranial nerve  deficit.     Sensory: No sensory deficit.     Motor: No weakness.     Coordination: Coordination normal.  Psychiatric:        Mood and Affect: Mood is anxious.        Behavior: Behavior normal.     ED Results / Procedures / Treatments   Labs (all labs ordered are listed, but only abnormal results are displayed) Labs Reviewed - No data to display  EKG None  Radiology DG Knee Complete 4 Views Left  Result Date: 06/14/2022 CLINICAL DATA:  Left knee pain EXAM: LEFT KNEE - COMPLETE 4+ VIEW COMPARISON:  None Available. FINDINGS: There is some soft tissue swelling of the anterior knee with likely joint effusion. Peripheral vascular calcifications are present. There is no acute fracture or dislocation. Joint spaces are well maintained. IMPRESSION: Soft tissue swelling of the anterior knee with likely joint effusion. No acute fracture or dislocation. Electronically Signed   By: Ronney Asters M.D.   On: 06/14/2022 16:20    Procedures Procedures    Medications Ordered in ED Medications  gabapentin (NEURONTIN) capsule 100 mg (100 mg Oral Given 06/14/22 1932)  oxyCODONE-acetaminophen (PERCOCET/ROXICET) 5-325 MG per tablet 1 tablet (1 tablet Oral Given 06/14/22 1932)    ED Course/ Medical Decision Making/ A&P                           Medical Decision Making Risk Prescription drug management.   This patient presents to the ED for concern of left leg pain, this involves an extensive number of treatment options, and is a complaint  that carries with it a high risk of complications and morbidity.  The differential diagnosis includes neuropathy, arthritis, DVT   Co morbidities that complicate the patient evaluation  DM, migraine headaches, GERD, chronic pain, anxiety, depression, fibromyalgia, bipolar disorder, neuropathy   Additional history obtained:  Additional history obtained from N/A External records from outside source obtained and reviewed including EMR  Imaging Studies ordered:  I ordered imaging studies including left knee x-ray I independently visualized and interpreted imaging which showed no acute osseous findings I agree with the radiologist interpretation   Problem List / ED Course / Critical interventions / Medication management  Patient presents for acute on chronic left leg pain.  She states that she has had ongoing left leg pain over the past year.  She describes it as migrating with times of shooting/burning pain.  Currently, she has pain localized around the posterior aspect of her left knee.  She endorsed intermittent swelling.  Prior to being bedded in the ED, x-ray imaging of left knee was obtained.  Results showed no osseous findings.  Although possible joint effusion was identified on x-ray, on exam, patient has no appreciable swelling or erythema.  She has no anterior tenderness.  She has mild posterior tenderness.  Active and passive range of motion is intact.  She has no associated weakness.  I do not appreciate any areas of swelling of her left leg.  Left leg does feel slightly warm to touch than the right.  There are no skin findings to suggest cellulitis.  I suspect patient has neuropathy and arthritis.  She is not currently on any medication for this.  She states that she previously took gabapentin for neuropathic pain but stopped because it made her sleepy.  She was agreeable to restarting gabapentin.  She was given a dose of gabapentin in the ED in addition  to a dose of Percocet.  Although  DVT study was initially ordered, ultrasound team stated that they are unable to do this at this time.  Given low suspicion of DVT, I feel patient is appropriate for discharge with PCP follow-up.  She was discharged in good condition. I ordered medication including gabapentin and Percocet for analgesia Reevaluation of the patient after these medicines showed that the patient improved I have reviewed the patients home medicines and have made adjustments as needed   Social Determinants of Health:  Has PCP        Final Clinical Impression(s) / ED Diagnoses Final diagnoses:  Left leg pain    Rx / DC Orders ED Discharge Orders          Ordered    gabapentin (NEURONTIN) 100 MG capsule  3 times daily        06/14/22 2218              Godfrey Pick, MD 06/14/22 2340

## 2022-06-14 NOTE — ED Triage Notes (Signed)
Pt reports left leg swelling about 6 months ago and saw PCP. They did hip xray that was negative. Pt states it feels like her whole leg is swollen and painful. No swelling noticed.

## 2022-06-14 NOTE — Discharge Instructions (Signed)
A prescription for gabapentin was sent to your pharmacy.  This is a medication that can help with neuropathic pain.  You were started on a low-dose.  Talk to your primary care doctor about long-term prescriptions and possible dose increases.  Continue ibuprofen and Tylenol as needed.  Follow-up with primary care doctor for management of chronic pain.  Return to the emergency department for any new or worsening symptoms of concern.

## 2022-06-28 ENCOUNTER — Other Ambulatory Visit: Payer: Self-pay | Admitting: Physician Assistant

## 2022-06-29 NOTE — Telephone Encounter (Signed)
Further refills can be given on visit

## 2022-07-04 ENCOUNTER — Ambulatory Visit (INDEPENDENT_AMBULATORY_CARE_PROVIDER_SITE_OTHER): Payer: Medicare Other | Admitting: Internal Medicine

## 2022-07-04 ENCOUNTER — Telehealth (HOSPITAL_COMMUNITY): Payer: Self-pay | Admitting: *Deleted

## 2022-07-04 ENCOUNTER — Encounter: Payer: Self-pay | Admitting: Internal Medicine

## 2022-07-04 VITALS — BP 140/60 | HR 76 | Temp 98.1°F | Ht 63.5 in | Wt 113.0 lb

## 2022-07-04 DIAGNOSIS — I700 Atherosclerosis of aorta: Secondary | ICD-10-CM | POA: Diagnosis not present

## 2022-07-04 DIAGNOSIS — Z0001 Encounter for general adult medical examination with abnormal findings: Secondary | ICD-10-CM | POA: Diagnosis not present

## 2022-07-04 DIAGNOSIS — E782 Mixed hyperlipidemia: Secondary | ICD-10-CM

## 2022-07-04 DIAGNOSIS — E1069 Type 1 diabetes mellitus with other specified complication: Secondary | ICD-10-CM

## 2022-07-04 DIAGNOSIS — E108 Type 1 diabetes mellitus with unspecified complications: Secondary | ICD-10-CM

## 2022-07-04 DIAGNOSIS — J410 Simple chronic bronchitis: Secondary | ICD-10-CM | POA: Diagnosis not present

## 2022-07-04 LAB — MICROALBUMIN / CREATININE URINE RATIO
Creatinine,U: 86.3 mg/dL
Microalb Creat Ratio: 0.8 mg/g (ref 0.0–30.0)
Microalb, Ur: <0.7 mg/dL (ref 0.0–1.9)

## 2022-07-04 MED ORDER — GABAPENTIN 300 MG PO CAPS: 300.0000 mg | ORAL_CAPSULE | Freq: Three times a day (TID) | ORAL | 5 refills | Status: DC

## 2022-07-04 NOTE — Progress Notes (Signed)
   Subjective:   Patient ID: Leslie Lane, female    DOB: [birth year], 67 y.o.   MRN: [id number]  HPI The patient is here for physical.  PMH, [facility], social history reviewed and updated  Review of Systems  Constitutional:  Positive for activity change. Negative for appetite change, chills, fatigue, fever and unexpected weight change.  HENT: Negative.    Respiratory: Negative.    Cardiovascular: Negative.   Gastrointestinal: Negative.   Musculoskeletal:  Positive for arthralgias, back pain and myalgias. Negative for gait problem and joint swelling.  Skin: Negative.   Neurological:  Positive for numbness.    Objective:  Physical Exam Constitutional:      Appearance: She is well-developed.     Comments: thin  HENT:     Head: Normocephalic and atraumatic.  Cardiovascular:     Rate and Rhythm: Normal rate and regular rhythm.  Pulmonary:     Effort: Pulmonary effort is normal. No respiratory distress.     Breath sounds: Normal breath sounds. No wheezing or rales.  Abdominal:     General: Bowel sounds are normal. There is no distension.     Palpations: Abdomen is soft.     Tenderness: There is no abdominal tenderness. There is no rebound.  Musculoskeletal:     Cervical back: Normal range of motion.  Skin:    General: Skin is warm and dry.     Comments: Foot exam done  Neurological:     Mental Status: She is alert and oriented to person, place, and time.     Coordination: Coordination normal.     Vitals:   07/04/22 1301 07/04/22 1306  BP: (!) 140/60 (!) 140/60  Pulse: 76   Temp: 98.1 F (36.7 C)   TempSrc: Oral   SpO2: 93%   Weight: 113 lb (51.3 kg)   Height: 5' 3.5" (1.613 m)     Assessment & Plan:

## 2022-07-04 NOTE — Telephone Encounter (Signed)
Patient walked in to clinic to ask about getting a refill of Lamotrigine. States she has about 3-4 days left and her next appointment is not until 08/03/22. Message sent to MD

## 2022-07-04 NOTE — Patient Instructions (Addendum)
We will increase the dose of the gabapentin to 300 mg 3 times a day to help more the knee and the burning in the knee.

## 2022-07-06 ENCOUNTER — Other Ambulatory Visit (HOSPITAL_COMMUNITY): Payer: Self-pay | Admitting: Psychiatry

## 2022-07-06 ENCOUNTER — Encounter: Payer: Self-pay | Admitting: Internal Medicine

## 2022-07-06 DIAGNOSIS — F4010 Social phobia, unspecified: Secondary | ICD-10-CM

## 2022-07-06 DIAGNOSIS — F332 Major depressive disorder, recurrent severe without psychotic features: Secondary | ICD-10-CM

## 2022-07-06 MED ORDER — LAMOTRIGINE 100 MG PO TABS: 100.0000 mg | ORAL_TABLET | Freq: Two times a day (BID) | ORAL | 0 refills | Status: DC

## 2022-07-06 NOTE — Assessment & Plan Note (Signed)
Taking lovastatin 40 mg daily and continue.

## 2022-07-06 NOTE — Assessment & Plan Note (Signed)
Counseled to quit smoking and she is unable to make serious attempt at this time. Reminded of risk/harm from smoking.

## 2022-07-06 NOTE — Assessment & Plan Note (Signed)
Taking lovastatin and will continue.

## 2022-07-06 NOTE — Assessment & Plan Note (Signed)
Flu shot up to date. Covid-19 counseled. Pneumonia complete. Shingrix due at pharmacy. Tetanus due at pharmacy. Colonoscopy up to date. Mammogram up to date, pap smear aged out and dexa complete. Lung cancer screening up to date. Counseled about sun safety and mole surveillance. Counseled about the dangers of distracted driving. Given 10 year screening recommendations.

## 2022-07-06 NOTE — Assessment & Plan Note (Signed)
Did foot exam and ordered microalbumin to creatinine ratio. Sees endo for management and no low sugars lately.

## 2022-07-07 DIAGNOSIS — E1065 Type 1 diabetes mellitus with hyperglycemia: Secondary | ICD-10-CM | POA: Diagnosis not present

## 2022-07-12 ENCOUNTER — Other Ambulatory Visit: Payer: Self-pay | Admitting: Pulmonary Disease

## 2022-07-21 ENCOUNTER — Ambulatory Visit: Payer: Medicare Other | Admitting: Physician Assistant

## 2022-07-21 ENCOUNTER — Other Ambulatory Visit: Payer: Self-pay | Admitting: Physician Assistant

## 2022-07-24 NOTE — Telephone Encounter (Signed)
Patient is to schedule an appt

## 2022-07-26 ENCOUNTER — Telehealth: Payer: Self-pay

## 2022-07-26 NOTE — Telephone Encounter (Signed)
[facility] called requesting chart notes. Chart notes faxed to [phone].

## 2022-08-03 ENCOUNTER — Telehealth (HOSPITAL_COMMUNITY): Payer: Self-pay | Admitting: Psychiatry

## 2022-08-03 ENCOUNTER — Ambulatory Visit (HOSPITAL_COMMUNITY): Payer: Medicare Other | Admitting: Psychiatry

## 2022-08-03 NOTE — Telephone Encounter (Signed)
Patient did not arrive for her in person scheduled visit. Patient was not present on video platform used through [employer]. I called the patient at our scheduled appointment time. There was no answer. There was no return phone call during out scheduled visit time. I was not able to speak with the patient today, as they were a no show for their scheduled appointment.

## 2022-08-07 ENCOUNTER — Other Ambulatory Visit (INDEPENDENT_AMBULATORY_CARE_PROVIDER_SITE_OTHER): Payer: Medicare Other

## 2022-08-07 DIAGNOSIS — E1065 Type 1 diabetes mellitus with hyperglycemia: Secondary | ICD-10-CM | POA: Diagnosis not present

## 2022-08-07 LAB — HEMOGLOBIN A1C: Hgb A1c MFr Bld: 7.1 % — ABNORMAL HIGH (ref 4.6–6.5)

## 2022-08-07 LAB — GLUCOSE, RANDOM: Glucose, Bld: 115 mg/dL — ABNORMAL HIGH (ref 70–99)

## 2022-08-10 ENCOUNTER — Telehealth (HOSPITAL_COMMUNITY): Payer: Medicare Other | Admitting: Psychiatry

## 2022-08-11 ENCOUNTER — Ambulatory Visit: Payer: Medicare Other | Admitting: Endocrinology

## 2022-08-11 ENCOUNTER — Other Ambulatory Visit (HOSPITAL_COMMUNITY): Payer: Self-pay | Admitting: Psychiatry

## 2022-08-11 ENCOUNTER — Other Ambulatory Visit: Payer: Self-pay | Admitting: Physician Assistant

## 2022-08-11 ENCOUNTER — Encounter: Payer: Self-pay | Admitting: Endocrinology

## 2022-08-11 VITALS — BP 120/80 | HR 74 | Ht 63.5 in | Wt 112.0 lb

## 2022-08-11 DIAGNOSIS — F332 Major depressive disorder, recurrent severe without psychotic features: Secondary | ICD-10-CM

## 2022-08-11 DIAGNOSIS — E1065 Type 1 diabetes mellitus with hyperglycemia: Secondary | ICD-10-CM | POA: Diagnosis not present

## 2022-08-11 DIAGNOSIS — E7800 Pure hypercholesterolemia, unspecified: Secondary | ICD-10-CM

## 2022-08-11 DIAGNOSIS — F4010 Social phobia, unspecified: Secondary | ICD-10-CM

## 2022-08-11 NOTE — Patient Instructions (Addendum)
Levemir 4 units in am and 10 at [place] about [place] 3 system

## 2022-08-11 NOTE — Progress Notes (Signed)
Patient ID: Leslie Lane, female   DOB: 1955-11-14, 67 y.o.   MRN: [id number]  Reason for Appointment: Endocrinology follow-up   History of Present Illness    Diagnosis: Type 1 DIABETES MELITUS, diagnosed 1967      She has had labile blood sugar control over the last several years even though A1c has been usually around 7% She has had less lability and hypoglycemia with taking b.i.d. Lantus compared once a day She has been very sensitive to fast acting insulin and frequently does not require mealtime coverage She cannot tolerate Toujeo because of reported episode of headache, bloating and nausea with the first dose  RECENT history:  Insulin regimen: LEVEMIR 3 units in the morning and 9 at 11 pm  HUMALOG 1-2 units up to 5 times a day  Current blood sugar patterns, control and problems identified:  Her A1c is 7.1  She has generally poor control with only 63% of her readings within target She says she is taking Humalog more frequently during the day because blood sugars tend to go up periodically Currently most of her hyperglycemia is late evening and overnight She said that she is usually eating more snacks late at night and during the night also without taking coverage However daytime blood sugars are mostly below the 180 target but low normal or low before dinner overall  Weight however has gone up possibly from getting more calories and snack She is mostly taking Humalog as needed when blood sugars are relatively higher before meals and sometimes in between Compared to before she has had much less hypoglycemia and only occasionally around 4-5 PM possibly even when she is more active  Again as before she thinks her freestyle Elenor Legato is not and is possibly reading higher, she usually is using her Accu-Chek to figure out how much to take She did not call for the upgrade for the [place] 3 even though she has been on the [place] sensor currently for over 3 years Lab glucose was  115 vs 145 approximately on her sensor at the same time Hypoglycemia has been generally inconsistent and lonely when she overdoes her correction doses in the evenings or sometimes late afternoon from increased activity   Mealtimes: Breakfast 8-8.30 am, lunch 12 noon , dinner 5 pm  Hypoglycemia: Symptoms may be absent with low blood sugars and does not have early warning symptoms. Gets confused and she frequently depends on her mother to recognize low sugars and treat them.  Her mother knows how to give Glucagon injection  She will treat her low blood sugars usually with juice, does carry glucose tablets when not at home   Blood sugar patterns as follows from [facility] to download for the last 2 weeks  Blood sugars are exhibiting moderate variability with GV 40 compared to 34 before HYPERGLYCEMIC episodes are seen periodically late evening and overnight mostly and occasionally late afternoon  During the day blood sugars are trending lower between morning and 6 PM with some hypoglycemia around 4-7 PM and rarely around midnight  On an average postprandial blood sugars are not significantly higher However she will have some significantly high readings after around 9 PM Also has hyperglycemia preceded by hypoglycemia Time in range is slightly better than before  CGM use % of time   2-week average/GV 156  Time in range      63  %  % Time Above 180 22  % Time above 250 9  % Time Below 70  6     PRE-MEAL Fasting Lunch Dinner Bedtime Overall  Glucose range:       Averages:     156   POST-MEAL PC Breakfast PC Lunch PC Dinner  Glucose range:     Averages:        CGM use % of time 96  2-week average/GV 180/34  Time in range      50%  % Time Above 180 34  % Time above 250 15  % Time Below 70 1     PRE-MEAL Fasting Midday Dinner Bedtime Overall  Glucose range:       Averages: 161 200 107     POST-MEAL PC Breakfast PC Lunch PC Dinner  Glucose range:     Averages:   226     Wt  Readings from Last 3 Encounters:  08/11/22 112 lb (50.8 kg)  07/04/22 113 lb (51.3 kg)  06/14/22 105 lb 13.1 oz (48 kg)    Lab Results  Component Value Date   HGBA1C 7.1 (H) 08/07/2022   HGBA1C 7.0 (A) 04/24/2022   HGBA1C 6.6 (H) 12/12/2021   Lab Results  Component Value Date   MICROALBUR <0.7 07/04/2022   LDLCALC 81 06/07/2021   CREATININE 0.77 09/06/2021        Allergies as of 08/11/2022       Reactions   Penicillins Anaphylaxis   Sulfonamide Derivatives Anaphylaxis        Medication List        Accurate as of August 11, 2022  2:33 PM. If you have any questions, ask your nurse or doctor.          Accu-Chek Aviva Plus test strip Generic drug: glucose blood TEST BLOOD SUGAR 4 TIMES  DAILY   Accu-Chek Aviva Plus w/Device Kit USE AS DIRECTED   Accu-Chek Aviva Soln USE AS DIRECTED   Accu-Chek Softclix Lancets lancets Use as instructed to check blood sugar 4 times per day dx code E10.65   Anoro Ellipta 62.5-25 MCG/ACT Aepb Generic drug: umeclidinium-vilanterol INHALE 1 PUFF INTO THE LUNGS DAILY   aspirin EC 81 MG tablet Take 81 mg by mouth every morning.   B-D SINGLE USE SWABS REGULAR Pads USE FOUR TIMES DAILY   Clobetasol Propionate 0.05 % shampoo Apply topically.   donepezil 10 MG tablet Commonly known as: ARICEPT TAKE 1 TABLET BY MOUTH AT NIGHT   doxycycline 100 MG capsule Commonly known as: VIBRAMYCIN Take 100 mg by mouth daily.   escitalopram 5 MG tablet Commonly known as: LEXAPRO TAKE 1 TABLET BY MOUTH DAILY   fexofenadine 180 MG tablet Commonly known as: ALLEGRA Take 180 mg by mouth daily.   Fish Oil 1000 MG Caps Take 1 capsule by mouth daily.   FreeStyle Libre 2 Sensor Misc Use to check blood sugar daily   gabapentin 300 MG capsule Commonly known as: Neurontin Take 1 capsule (300 mg total) by mouth 3 (three) times daily.   Gvoke HypoPen 2-Pack 1 MG/0.2ML Soaj Generic drug: Glucagon Inject 1 mg into the skin as needed  for low blood sugar.   hyoscyamine 0.375 MG 12 hr tablet Commonly known as: Levbid Take 1 tablet (0.375 mg total) by mouth 2 (two) times daily.   insulin lispro 100 UNIT/ML KwikPen Commonly known as: HumaLOG KwikPen INJECT SUBCUTANEOUSLY 1 TO  3 UNITS 3 TIMES DAILY FOR  LARGE MEALS   Insulin Pen Needle 32G X 4 MM Misc Use inject insulin tid   B-D ULTRAFINE III SHORT PEN 31G  X 8 MM Misc Generic drug: Insulin Pen Needle SMARTSIG:pre-filled pen syringe SUB-Q Daily   lamoTRIgine 100 MG tablet Commonly known as: LAMICTAL Take 1 tablet (100 mg total) by mouth 2 (two) times daily. Bridge to appointment   Levemir FlexTouch 100 UNIT/ML FlexPen Generic drug: insulin detemir Inject 3 units in the morning and 6 units at bedtime What changed: additional instructions   lipase/protease/amylase 36000 UNITS Cpep capsule Commonly known as: Creon Take 2 capsules by mouth three times a day with meals and 1 capsule by mouth twice daily with snacks   lovastatin 40 MG tablet Commonly known as: MEVACOR TAKE 1 TABLET BY MOUTH AT  BEDTIME   Magnesium 500 MG Tabs Take 1 tablet by mouth daily.   multivitamin tablet Take 1 tablet by mouth daily. Reported on 11/22/2015   omeprazole 40 MG capsule Commonly known as: PRILOSEC TAKE 1 CAPSULE(40 MG) BY MOUTH IN THE MORNING AND AT BEDTIME   traZODone 100 MG tablet Commonly known as: DESYREL Take 2 tablets (200 mg total) by mouth at bedtime.   triamcinolone cream 0.1 % Commonly known as: KENALOG   vitamin E 1000 UNIT capsule Take 1,000 Units by mouth daily.        Allergies:  Allergies  Allergen Reactions   Penicillins Anaphylaxis   Sulfonamide Derivatives Anaphylaxis    Past Medical History:  Diagnosis Date   Abdominal pain 02/28/2016   Abnormal CT scan, bladder    Noted 07/28/13 CT - s/p uro eval 08/2013 [place] -    Acquired trigger finger of left ring finger 02/18/2019   Injected February 18, 2019 and March 2022   Allergic rhinitis     Anemia    Arthritis    Bipolar disorder 07/05/2010   Cataract    Chronic lower back pain 07/05/2010   Chronic neck pain    Diabetic gastroparesis associated with type 1 diabetes mellitus 10/24/2016   Fibromyalgia    Generalized anxiety disorder 04/27/2014   GERD (gastroesophageal reflux disease) 07/05/2010   Hepatitis B virus infection 07/05/2010   History of substance abuse    opiates; cocaine   Hypoglycemia unawareness associated with type 1 diabetes mellitus 11/23/2020   Insomnia 04/13/2015   Major depressive disorder 07/28/2014   Migraine headache 07/05/2010   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  It hasn't happened in a long time."   Smokers' cough 07/05/2010   Tubular adenoma of colon 2018   Type 1 diabetes mellitus with complication [id number]   WBC decreased 03/27/2017    Past Surgical History:  Procedure Laterality Date   CESAREAN SECTION     COLONOSCOPY     TUBAL LIGATION     UPPER GASTROINTESTINAL ENDOSCOPY      Family History  Problem Relation Age of Onset   Arthritis Mother    Diverticulosis Mother    Kidney disease Mother    Hyperlipidemia Mother    Arthritis Father    Kidney disease Father    Kidney cancer Father    Bladder Cancer Sister    Colon cancer Sister 54   Celiac disease Sister    Heart attack Brother    Kidney cancer Paternal Aunt    Diabetes Niece    Hyperlipidemia Other  Anxiety disorder Neg Hx    Bipolar disorder Neg Hx    Depression Neg Hx    Breast cancer Neg Hx    Esophageal cancer Neg Hx    Stomach cancer Neg Hx    Rectal cancer Neg Hx     Social History:  reports that she has been smoking cigarettes. She has a 75.00 pack-year smoking history.  She has never used smokeless tobacco. She reports current alcohol use of about 7.0 standard drinks of alcohol per week. She reports that she does not currently use drugs after having used the following drugs: Cocaine.  Review of Systems:   Had gastroparesis previously treated with Reglan  Also being followed by gastroenterologist for abdominal pain and pancreatic insufficiency For diarrhea she is taking Creon    NEUROPATHY: She has history of burning in her legs and feet; gabapentin as needed She has been prescribed Cymbalta with some benefit   DEPRESSION: She has had long-standing depression and anxiety on long-term treatment  HYPERLIPIDEMIA: The lipid abnormality consists of elevated LDL   Is  on lovastatin 40 mg with  good control, prescribed by PCP   Lab Results  Component Value Date   CHOL 209 (H) 06/07/2021   HDL 115.00 06/07/2021   LDLCALC 81 06/07/2021   LDLDIRECT 101.7 03/26/2012   TRIG 64.0 06/07/2021   CHOLHDL 2 06/07/2021    Memory difficulties: She was given Aricept by the neurologist, is taking 10 mg    Examination:   BP 120/80 (BP Location: Left Arm, Patient Position: Sitting, Cuff Size: Small)   Pulse 74   Ht 5' 3.5" (1.613 m)   Wt 112 lb (50.8 kg)   SpO2 94%   BMI 19.53 kg/m   Body mass index is 19.53 kg/m.    ASSESSMENT/ PLAN:   Diabetes type 1 longstanding  See history of present illness for detailed discussion of current insulin, blood sugar patterns, problems identified  A1c has been now 7.1 and generally higher  Her blood sugar is difficult to control because of her dietary habits She is snacking a lot during the night blood sugars are highly variable Generally blood sugars are still relatively higher overall and she may be stacking insulin doses for her Humalog  Also periodically appears to have inaccurate readings on her Elenor Legato and today it was appearing to be higher than expected Reviewed the periods of highs and lows and how to adjust  her insulin based on diet and activity level Discussion we will have to adjust her mealtime insulin based on carbohydrates and blood sugar patterns  Recommendations:  She will try to increase her Levemir by 1 unit twice daily for now, this should improve her Premeal blood sugars and reduce the need for Humalog Advised her to cut back on snacking at night She is eating a large snack late in the evening she needs to take 2 units of Humalog to cover this She should avoid taking Humalog more frequently than every 4-5 hours to prevent stacking especially in the afternoon She will call her DME supplier to get the [place] 3 if eligible, again is reluctant to use the Dexcom as she previously could not understand how to do it.  Will try to send it to the same supplier that she is using for [place] Discussed that as long as she is keeping her average blood sugar around 150 she should not over aggressively treat the high sugars  Total visit time including counseling = 30 minutes  Patient  Instructions  Levemir 4 units in am and 10 at [place] about [place] 08/11/2022, 2:33 PM

## 2022-08-12 ENCOUNTER — Other Ambulatory Visit: Payer: Self-pay

## 2022-08-12 MED ORDER — FREESTYLE LIBRE 3 READER DEVI: 1.0000 | 0 refills | Status: DC

## 2022-08-12 MED ORDER — FREESTYLE LIBRE 3 SENSOR MISC: 3 refills | Status: DC

## 2022-08-14 ENCOUNTER — Other Ambulatory Visit: Payer: Self-pay

## 2022-08-14 MED ORDER — FREESTYLE LIBRE 3 SENSOR MISC: 3 refills | Status: DC

## 2022-08-14 MED ORDER — FREESTYLE LIBRE 3 READER DEVI: 1.0000 | 0 refills | Status: DC

## 2022-08-15 ENCOUNTER — Encounter: Payer: Self-pay | Admitting: Endocrinology

## 2022-08-17 ENCOUNTER — Telehealth (HOSPITAL_COMMUNITY): Payer: Self-pay | Admitting: Psychiatry

## 2022-08-17 ENCOUNTER — Other Ambulatory Visit (HOSPITAL_COMMUNITY): Payer: Self-pay | Admitting: Psychiatry

## 2022-08-17 ENCOUNTER — Telehealth (HOSPITAL_COMMUNITY): Payer: Medicare Other | Admitting: Psychiatry

## 2022-08-17 DIAGNOSIS — F4010 Social phobia, unspecified: Secondary | ICD-10-CM

## 2022-08-17 DIAGNOSIS — G4701 Insomnia due to medical condition: Secondary | ICD-10-CM

## 2022-08-17 DIAGNOSIS — F332 Major depressive disorder, recurrent severe without psychotic features: Secondary | ICD-10-CM

## 2022-08-17 DIAGNOSIS — F4322 Adjustment disorder with anxiety: Secondary | ICD-10-CM

## 2022-08-17 MED ORDER — TRAZODONE HCL 100 MG PO TABS: 200.0000 mg | ORAL_TABLET | Freq: Every day | ORAL | 0 refills | Status: DC

## 2022-08-17 MED ORDER — LAMOTRIGINE 100 MG PO TABS: 50.0000 mg | ORAL_TABLET | Freq: Two times a day (BID) | ORAL | 0 refills | Status: DC

## 2022-08-17 MED ORDER — ESCITALOPRAM OXALATE 5 MG PO TABS: 5.0000 mg | ORAL_TABLET | Freq: Every day | ORAL | 0 refills | Status: DC

## 2022-08-17 NOTE — Telephone Encounter (Signed)
Leslie Lane was unable to do a video visit for our scheduled appointment today. She opted to cancel today's visit and reschedule to an in person visit since she did not want to potentially pay out of pocket for a phone call. We rescheduled for an in person visit on 10/05/22 and I am refilling her meds to bridge her to the next scheduled visit.

## 2022-08-17 NOTE — Progress Notes (Signed)
Leslie Lane was unable to do a video visit for our scheduled appointment today. She opted to reschedule since she did not want to potentially pay out of pocket for a phone call. We rescheduled for an in person visit on 10/05/22 and I am refilling her meds to bridge her to the next scheduled visit.

## 2022-08-23 ENCOUNTER — Ambulatory Visit: Payer: Medicare Other | Admitting: Physician Assistant

## 2022-08-23 ENCOUNTER — Encounter: Payer: Self-pay | Admitting: Physician Assistant

## 2022-08-23 VITALS — BP 135/65 | HR 73 | Resp 20 | Ht 63.5 in | Wt 112.0 lb

## 2022-08-23 DIAGNOSIS — F0670 Mild neurocognitive disorder due to known physiological condition without behavioral disturbance: Secondary | ICD-10-CM

## 2022-08-23 NOTE — Patient Instructions (Addendum)
It was a pleasure to see you today at our office.   Recommendations:  Meds: Continue  Donepezil 10 mg: take 1 tablet every night Follow up in 6 months  Follow up with psychiatry, GI, Primary doctors  Stop smoking  Talk to your primary doctor about changing omeprazole to another medication because it affects memory  Get involved with activities outside of the house   [place]: 1. Continue to exercise (Recommend 30 minutes of walking everyday, or 3 hours every week) 2. Increase social interactions - continue going to [place] and enjoy social gatherings with friends and family 3. Eat healthy, avoid fried foods and eat more fruits and vegetables 4. Maintain adequate blood pressure, blood sugar, and blood cholesterol level. Reducing the risk of stroke and cardiovascular disease also helps promoting better memory. 5. Avoid stressful situations. Live a simple life and avoid aggravations. Organize your time and prepare for the next day in anticipation. 6. Sleep well, avoid any interruptions of sleep and avoid any distractions in the bedroom that may interfere with adequate sleep quality 7. Avoid sugar, avoid sweets as there is a strong link between excessive sugar intake, diabetes, and cognitive impairment We discussed the Mediterranean diet, which has been shown to help patients reduce the risk of progressive memory disorders and reduces cardiovascular risk. This includes eating fish, eat fruits and green leafy vegetables, nuts like almonds and hazelnuts, walnuts, and also use olive oil. Avoid fast foods and fried foods as much as possible. Avoid sweets and sugar as sugar use has been linked to worsening of memory function.  There is always a concern of gradual progression of memory problems. If this is the case, then we may need to adjust level of care according to patient needs. Support, both to the patient and caregiver, should then be put into place.       You have been referred for a neuropsychological evaluation (i.e., evaluation of memory and thinking abilities). Please bring someone with you to this appointment if possible, as it is helpful for the doctor to hear from both you and another adult who knows you well. Please bring eyeglasses and hearing aids if you wear them.    The evaluation will take approximately 3 hours and has two parts:   The first part is a clinical interview with the neuropsychologist (Dr. Melvyn Novas or Dr. Nicole Kindred). During the interview, the neuropsychologist will speak with you and the individual you brought to the appointment.    The second part of the evaluation is testing with the doctor's technician Hinton Dyer or Maudie Mercury). During the testing, the technician will ask you to remember different types of material, solve problems, and answer some questionnaires. Your family member will not be present for this portion of the evaluation.   Please note: We must reserve several hours of the neuropsychologist's time and the psychometrician's time for your evaluation appointment. As such, there is a No-Show fee of $100. If you are unable to attend any of your appointments, please contact our office as soon as possible to reschedule.    FALL PRECAUTIONS: Be cautious when walking. Scan the area for obstacles that may increase the risk of trips and falls. When getting up in the mornings, sit up at the edge of the bed for a few minutes before getting out of bed. Consider elevating the bed at the head end to avoid drop of blood pressure when getting up. Walk always in a well-lit room (use night lights  in the walls). Avoid area rugs or power cords from appliances in the middle of the walkways. Use a walker or a cane if necessary and consider physical therapy for balance exercise. Get your eyesight checked regularly.  FINANCIAL OVERSIGHT: Supervision, especially oversight when making financial decisions or transactions is also  recommended.  HOME SAFETY: Consider the safety of the kitchen when operating appliances like stoves, microwave oven, and blender. Consider having supervision and share cooking responsibilities until no longer able to participate in those. Accidents with firearms and other hazards in the house should be identified and addressed as well.   ABILITY TO BE LEFT ALONE: If patient is unable to contact 911 operator, consider using LifeLine, or when the need is there, arrange for someone to stay with patients. Smoking is a fire hazard, consider supervision or cessation. Risk of wandering should be assessed by caregiver and if detected at any point, supervision and safe proof recommendations should be instituted.  MEDICATION SUPERVISION: Inability to self-administer medication needs to be constantly addressed. Implement a mechanism to ensure safe administration of the medications.   DRIVING: Regarding driving, in patients with progressive memory problems, driving will be impaired. We advise to have someone else do the driving if trouble finding directions or if minor accidents are reported. Independent driving assessment is available to determine safety of driving.   If you are interested in the driving assessment, you can contact the following:  [employer] in [place]  [place] [phone] or [phone]    [place] refers to food and lifestyle choices that are based on the traditions of countries located on the [employer]. This way of eating has been shown to help prevent certain conditions and improve outcomes for people who have chronic diseases, like kidney disease and heart disease. What are tips for following this plan? Lifestyle  Cook and eat meals together with your family, when possible. Drink enough fluid to keep your urine clear  or pale yellow. Be physically active every day. This includes: Aerobic exercise like running or swimming. Leisure activities like gardening, walking, or housework. Get 7-8 hours of sleep each night. If recommended by your health care provider, drink red wine in moderation. This means 1 glass a day for nonpregnant women and 2 glasses a day for men. A glass of wine equals 5 oz (150 mL). Reading food labels  Check the serving size of packaged foods. For foods such as rice and pasta, the serving size refers to the amount of cooked product, not dry. Check the total fat in packaged foods. Avoid foods that have saturated fat or trans fats. Check the ingredients list for added sugars, such as corn syrup. Shopping  At the grocery store, buy most of your food from the areas near the walls of the store. This includes: Fresh fruits and vegetables (produce). Grains, beans, nuts, and seeds. Some of these may be available in unpackaged forms or large amounts (in bulk). Fresh seafood. Poultry and eggs. Low-fat dairy products. Buy whole ingredients instead of prepackaged foods. Buy fresh fruits and vegetables in-season from local farmers markets. Buy frozen fruits and vegetables in resealable bags. If you do not have access to quality fresh seafood, buy precooked frozen shrimp or canned fish, such as tuna, salmon, or sardines. Buy small amounts of raw or cooked vegetables, salads, or olives from the deli or salad bar at your store. Stock Conservator, museum/gallery  so you always have certain foods on hand, such as olive oil, canned tuna, canned tomatoes, rice, pasta, and beans. Cooking  Cook foods with extra-virgin olive oil instead of using butter or other vegetable oils. Have meat as a side dish, and have vegetables or grains as your main dish. This means having meat in small portions or adding small amounts of meat to foods like pasta or stew. Use beans or vegetables instead of meat in common dishes like chili or  lasagna. Experiment with different cooking methods. Try roasting or broiling vegetables instead of steaming or sauteing them. Add frozen vegetables to soups, stews, pasta, or rice. Add nuts or seeds for added healthy fat at each meal. You can add these to yogurt, salads, or vegetable dishes. Marinate fish or vegetables using olive oil, lemon juice, garlic, and fresh herbs. Meal planning  Plan to eat 1 vegetarian meal one day each week. Try to work up to 2 vegetarian meals, if possible. Eat seafood 2 or more times a week. Have healthy snacks readily available, such as: Vegetable sticks with hummus. Greek yogurt. Fruit and nut trail mix. Eat balanced meals throughout the week. This includes: Fruit: 2-3 servings a day Vegetables: 4-5 servings a day Low-fat dairy: 2 servings a day Fish, poultry, or lean meat: 1 serving a day Beans and legumes: 2 or more servings a week Nuts and seeds: 1-2 servings a day Whole grains: 6-8 servings a day Extra-virgin olive oil: 3-4 servings a day Limit red meat and sweets to only a few servings a month What are my food choices? Mediterranean diet Recommended Grains: Whole-grain pasta. Brown rice. Bulgar wheat. Polenta. Couscous. Whole-wheat bread. [place]. Vegetables: Artichokes. Beets. Broccoli. Cabbage. Carrots. Eggplant. Green beans. Chard. Kale. Spinach. Onions. Leeks. Peas. Squash. Tomatoes. Peppers. Radishes. Fruits: Apples. Apricots. Avocado. Berries. Bananas. Cherries. Dates. Figs. Grapes. Lemons. Melon. Oranges. Peaches. Plums. Pomegranate. Meats and other protein foods: Beans. Almonds. Sunflower seeds. Pine nuts. Peanuts. Hull. Salmon. Scallops. Shrimp. Felt. Tilapia. Clams. Oysters. Eggs. Dairy: Low-fat milk. Cheese. Greek yogurt. Beverages: Water. Red wine. Herbal tea. Fats and oils: Extra virgin olive oil. Avocado oil. Grape seed oil. Sweets and desserts: [place] yogurt with honey. Baked apples. Poached pears. Trail mix. Seasoning and  other foods: Basil. Cilantro. Coriander. Cumin. Mint. Parsley. Sage. Rosemary. Tarragon. Garlic. Oregano. Thyme. Pepper. Balsalmic vinegar. Tahini. Hummus. Tomato sauce. Olives. Mushrooms. Limit these Grains: Prepackaged pasta or rice dishes. Prepackaged cereal with added sugar. Vegetables: Deep fried potatoes (french fries). Fruits: Fruit canned in syrup. Meats and other protein foods: Beef. Pork. Lamb. Poultry with skin. Hot dogs. [place] Salines. Dairy: Ice cream. Sour cream. Whole milk. Beverages: Juice. Sugar-sweetened soft drinks. Beer. Liquor and spirits. Fats and oils: Butter. Canola oil. Vegetable oil. Beef fat (tallow). Lard. Sweets and desserts: Cookies. Cakes. Pies. Candy. Seasoning and other foods: Mayonnaise. Premade sauces and marinades. The items listed may not be a complete list. Talk with your dietitian about what dietary choices are right for you. Summary The Mediterranean diet includes both food and lifestyle choices. Eat a variety of fresh fruits and vegetables, beans, nuts, seeds, and whole grains. Limit the amount of red meat and sweets that you eat. Talk with your health care provider about whether it is safe for you to drink red wine in moderation. This means 1 glass a day for nonpregnant women and 2 glasses a day for men. A glass of wine equals 5 oz (150 mL). This information is not intended to replace advice given to you  by your health care provider. Make sure you discuss any questions you have with your health care provider. Document Released: 02/10/2016 Document Revised: 03/14/2016 Document Reviewed: 02/10/2016 Elsevier Interactive Patient Education  2017 [employer].

## 2022-08-23 NOTE — Progress Notes (Signed)
Assessment/Plan:   Mild Cognitive Impairment  Leslie Lane is a very pleasant 67 y.o. RH female  with a history of type 1 diabetes mellitus, hyperlipidemia, fibromyalgia, history of migraines, major depressive disorder, sleep disorder, GAD, bipolar disorder, cutaneous lupus and with a history of mild cognitive impairment presenting today in follow-up for evaluation of memory loss prior Neuropsych evaluation on 03/29/2021 was discontinued prematurely, dialysis several aspects of the test were unable to be assessed, although prominent difficulties with basic attention, expressive language, visual-spatial abilities sending codeine aspects of memory were affected. Patient is on donepezil 10 mg daily . Personally reviewed CT of the head on 10/20/2020 showed mild atrophy with periventricular small vessel disease, without acute infarct or masses or hemorrhage.  Interestingly, her MMSE today is 30/30.  It is possible that her high level of anxiety may be hindering her performance.  We discussed addressing this with her psychiatrist, and to consider CBT and relaxation techniques to improve her mood and in turn, improving her memory.  The patient continues to smoke, and tobacco cessation was discussed again, as this is going to contribute to her memory changes.   Recommendations:   Follow up in  6 months. Continue donepezil 10 mg daily. Side effects were discussed  Continue to control mood as per PCP and Psychiatry, for social anxiety disorder, insomnia and  severe MDD. Continue Lexapro, Lamictal and Trazodone  Consider relaxation techniques, CBT Discontinue tobacco use Increase outside activities for social stimulation  Recommend good control of cardiovascular risk factors     Subjective:   This patient is here alone . Previous records as well as any outside records available were reviewed prior to todays visit.   Patient was last seen on 01/18/22. Last MMSE was 22/30 on same date.      Any  changes in memory since last visit?  She has subjective memory loss complaints, stating that she has more trouble retrieving her words than before, then adding "when I speak too fast, I am rushing too much and I do not pay attention ".  She is able to retain recent conversations and people's names.   repeats oneself?  Endorsed, "just like my mama who is 32, it worries me".  There is a high level of anxiety when she talks about her mother who has dementia. Disoriented when walking into a room?  Patient denies  Leaving objects in unusual places?  2-3 weeks ago the patient could not find her keys, and they were in the ignition, "I became very upset about this issue ".    Wandering behavior?   denies   Any personality changes since last visit?   denies   Any worsening depression?: She sees a psychiatrist, "not well managed yet, she was sick and then my therapist was sick could not see her, I need my medications" Hallucinations or paranoia? Denies . Seizures?   denies    Any sleep changes?  "Sleeps  as long as she has trazodone " Denies vivid dreams, REM behavior or sleepwalking   Sleep apnea?   denies   Any hygiene concerns?   denies   Independent of bathing and dressing?  Endorsed  Does the patient needs help with medications? Patient is in charge  Who is in charge of the finances?  Patient is in charge    Any changes in appetite?  Eating more, gained >10 lbs, maybe the antidepressant    Patient have trouble swallowing?  denies   Does the patient cook?  Yes  Any kitchen accidents such as leaving the stove on?   denies   Any headaches?   denies   Vision changes? denies Chronic pain?  She has chronic right shoulder pain, as well as chronic pain due to fibromyalgia. Ambulates with difficulty?    denies   Recent falls or head injuries?    denies     Unilateral weakness, numbness or tingling?   denies   Any tremors?  denies   Any anosmia?    denies   Any incontinence of urine?  "When pain is  present, I cannot make it fast enough ". Uses pads  Any bowel dysfunction?  She has chronic diarrhea due to pancreatic insufficiency      Patient lives  with her mother  Does the patient drive?no issues driving, she only drives during the day  Smokes 1 carton q 2 weeks, trying to quit because she is expecting a grandchild in about 7 months for now   History on Initial Assessment 12/18/2018: This is a 67 year old woman with a history of type I DM, hyperlipidemia, fibromyalgia, migraines, major depressive disorder, GAD, rule out bipolar disorder, presenting for evaluation of memory loss. She is alone in the office with no family to corroborate history. She states memory changes started after she was in the hospital in a diabetic coma at age 62. She states she has not noticed any changes since then, memory has been about the same since then. She initially could not drive a car or remember a movie she had watched. She acquired a guardian to manage her finances. She lives with her mother. She has been told she repeats herself and says the same thing every morning. She used to leave the stove on, but has been more vigilant. She has been driving and has gotten lost driving in unfamiliar roads. She manages her medications independently. She states she does not like math and was not good in school. She has reported her memory concerns to her psychiatrist and reported that she cannot focus on anything. She is easily irritable. No paranoia or hallucinations. Sleep is good with Trazodone. No family history of dementia. No history of significant head injuries. She used to drink heavily when younger, then cut down to a beer every now and then after her diabetic coma.    She has headaches that improve when she is not smoking. She has a history of migraines and has mild bitemporal headaches, taking daily magnesium. She has chronic neck and back pain and neuropathy in her feet. No dizziness, vision changes, focal weakness,  bowel/bladder dysfunction, anosmia, or tremors.      Neurocognitive testing 03/29/21 Dr. Melvyn Novas Briefly, Leslie Lane exhibited very limited testing tolerance and abruptly discontinued the evaluation prematurely. Unfortunately, given the abbreviated nature of testing, several domains were unable to be thoroughly assessed and the current conceptualization is limited by this. Ms. Ladino pattern of performance is suggestive of prominent difficulties with basic attention, expressive language, visuospatial abilities, and encoding (i.e., learning) aspects of memory. Overall, remote substance abuse history, as well as chronic and acute psychiatric distress, is very likely exacerbating deficits related to various vascular conditions. Ongoing sleep dysfunction, chronic pain, and frequent headaches would also contribute to dysfunction.   Past Medical History:  Diagnosis Date   Abdominal pain 02/28/2016   Abnormal CT scan, bladder    Noted 07/28/13 CT - s/p uro eval 08/2013 [place] -    Acquired trigger finger of left ring finger 02/18/2019  Injected February 18, 2019 and March 2022   Allergic rhinitis    Anemia    Arthritis    Bipolar disorder 07/05/2010   Cataract    Chronic lower back pain 07/05/2010   Chronic neck pain    Diabetic gastroparesis associated with type 1 diabetes mellitus 10/24/2016   Fibromyalgia    Generalized anxiety disorder 04/27/2014   GERD (gastroesophageal reflux disease) 07/05/2010   Hepatitis B virus infection 07/05/2010   History of substance abuse    opiates; cocaine   Hypoglycemia unawareness associated with type 1 diabetes mellitus 11/23/2020   Insomnia 04/13/2015   Major depressive disorder 07/28/2014   Migraine headache 07/05/2010   Mild neurocognitive disorder due to multiple etiologies 03/15/2021   Mixed diabetic hyperlipidemia associated with type 1 diabetes mellitus 07/05/2010   Neuropathy    Protein calorie malnutrition 06/01/2017   Right shoulder pain  02/18/2018   Seizures    pt states, "if my blood sugar drops to the 20's, I convulse.  It hasn't happened in a long time."   Smokers' cough 07/05/2010   Tubular adenoma of colon 2018   Type 1 diabetes mellitus with complication [id number]   WBC decreased 03/27/2017     Past Surgical History:  Procedure Laterality Date   CESAREAN SECTION     COLONOSCOPY     TUBAL LIGATION     UPPER GASTROINTESTINAL ENDOSCOPY       PREVIOUS MEDICATIONS:   CURRENT MEDICATIONS:  Outpatient Encounter Medications as of 08/23/2022  Medication Sig   Accu-Chek Softclix Lancets lancets Use as instructed to check blood sugar 4 times per day dx code E10.65   Alcohol Swabs (B-D SINGLE USE SWABS REGULAR) PADS USE FOUR TIMES DAILY   aspirin EC 81 MG tablet Take 81 mg by mouth every morning.   B-D ULTRAFINE III SHORT PEN 31G X 8 MM MISC SMARTSIG:pre-filled pen syringe SUB-Q Daily   Blood Glucose Calibration (ACCU-CHEK AVIVA) SOLN USE AS DIRECTED   Blood Glucose Monitoring Suppl (ACCU-CHEK AVIVA PLUS) w/Device KIT USE AS DIRECTED   Clobetasol Propionate 0.05 % shampoo Apply topically.   Continuous Blood Gluc Receiver (FREESTYLE LIBRE 3 READER) DEVI 1 Device by Does not apply route continuous.   Continuous Blood Gluc Sensor (FREESTYLE LIBRE 2 SENSOR) MISC Use to check blood sugar daily   Continuous Blood Gluc Sensor (FREESTYLE LIBRE 3 SENSOR) MISC Place 1 sensor on the skin every 14 days. Use to check glucose continuously   donepezil (ARICEPT) 10 MG tablet TAKE 1 TABLET BY MOUTH AT NIGHT   doxycycline (VIBRAMYCIN) 100 MG capsule Take 100 mg by mouth daily.   escitalopram (LEXAPRO) 5 MG tablet Take 1 tablet (5 mg total) by mouth daily.   fexofenadine (ALLEGRA) 180 MG tablet Take 180 mg by mouth daily.   gabapentin (NEURONTIN) 300 MG capsule Take 1 capsule (300 mg total) by mouth 3 (three) times daily.   Glucagon (GVOKE HYPOPEN 2-PACK) 1 MG/0.2ML SOAJ Inject 1 mg into the skin as needed for low blood sugar.    glucose blood (ACCU-CHEK AVIVA PLUS) test strip TEST BLOOD SUGAR 4 TIMES  DAILY   hyoscyamine (LEVBID) 0.375 MG 12 hr tablet Take 1 tablet (0.375 mg total) by mouth 2 (two) times daily.   insulin detemir (LEVEMIR FLEXTOUCH) 100 UNIT/ML FlexPen Inject 3 units in the morning and 6 units at bedtime (Patient taking differently: Inject 3 units in the morning and 9 units at bedtime)   insulin lispro (HUMALOG KWIKPEN) 100 UNIT/ML KwikPen INJECT SUBCUTANEOUSLY 1  TO  3 UNITS 3 TIMES DAILY FOR  LARGE MEALS   Insulin Pen Needle 32G X 4 MM MISC Use inject insulin tid   lamoTRIgine (LAMICTAL) 100 MG tablet Take 0.5 tablets (50 mg total) by mouth 2 (two) times daily. Bridge to appointment   lipase/protease/amylase (CREON) 36000 UNITS CPEP capsule Take 2 capsules by mouth three times a day with meals and 1 capsule by mouth twice daily with snacks   lovastatin (MEVACOR) 40 MG tablet TAKE 1 TABLET BY MOUTH AT  BEDTIME   Magnesium 500 MG TABS Take 1 tablet by mouth daily.   Multiple Vitamin (MULTIVITAMIN) tablet Take 1 tablet by mouth daily. Reported on 11/22/2015   Omega-3 Fatty Acids (FISH OIL) 1000 MG CAPS Take 1 capsule by mouth daily.   omeprazole (PRILOSEC) 40 MG capsule TAKE 1 CAPSULE(40 MG) BY MOUTH IN THE MORNING AND AT BEDTIME   traZODone (DESYREL) 100 MG tablet Take 2 tablets (200 mg total) by mouth at bedtime.   triamcinolone cream (KENALOG) 0.1 %    umeclidinium-vilanterol (ANORO ELLIPTA) 62.5-25 MCG/ACT AEPB INHALE 1 PUFF INTO THE LUNGS DAILY   vitamin E 1000 UNIT capsule Take 1,000 Units by mouth daily.   No facility-administered encounter medications on file as of 08/23/2022.     Objective:     PHYSICAL EXAMINATION:    VITALS:   Vitals:   08/23/22 1251  BP: 135/65  Pulse: 73  Resp: 20  SpO2: 99%  Weight: 112 lb (50.8 kg)  Height: 5' 3.5" (1.613 m)    GEN:  The patient appears stated age and is in NAD. HEENT:  Normocephalic, atraumatic.   Neurological examination:  General: NAD,  well-groomed, appears stated age. Orientation: The patient is alert. Oriented to person, place and date.  Very anxious appearing Cranial nerves: There is good facial symmetry.The speech is fluent and clear. No aphasia or dysarthria. Fund of knowledge is appropriate. Recent and remote memory is normal.  Attention and concentration are normal.  Able to name objects and repeat phrases.  Hearing is intact to conversational tone.   Delayed recall 3/3 Sensation: Sensation is intact to light touch throughout Motor: Strength is at least antigravity x4. Tremors: none  DTR's 2/4 in UE/LE      07/30/2019    1:00 PM 12/18/2018   11:00 AM  Montreal Cognitive Assessment   Visuospatial/ Executive (0/5)  1  Naming (0/3)  3  Attention: Read list of digits (0/2) 1 1  Attention: Read list of letters (0/1) 1 1  Attention: Serial 7 subtraction starting at 100 (0/3) 1 2  Language: Repeat phrase (0/2) 0 0  Language : Fluency (0/1) 0 0  Abstraction (0/2) 2 0  Delayed Recall (0/5) 4 0  Orientation (0/6) 6 5  Total  13  Adjusted Score (based on education)  14       08/23/2022    1:00 PM 12/03/2020    3:00 PM 05/30/2017   11:57 AM  MMSE - Mini Mental State Exam  Orientation to time 5 5 5  $ Orientation to Place 5 5 5  $ Registration 3 3 3  $ Attention/ Calculation 5 0 3  Recall 3 0 2  Language- name 2 objects 2 2 2  $ Language- repeat 1 1 1  $ Language- follow 3 step command 3 3 3  $ Language- read & follow direction 1 1 1  $ Write a sentence 1 1 1  $ Copy design 1 1 1  $ Total score 30 22 27  Movement examination: Tone: There is normal tone in the UE/LE Abnormal movements:  no tremor.  No myoclonus.  No asterixis.   Coordination:  There is no decremation with RAM's. Normal finger to nose  Gait and Station: The patient has no difficulty arising out of a deep-seated chair without the use of the hands. The patient's stride length is good.  Gait is cautious and narrow.   Thank you for allowing [place] the  opportunity to participate in the care of this nice patient. Please do not hesitate to contact us for any questions or concerns.   Total time spent on today's visit was 32 minutes dedicated to this patient today, preparing to see patient, examining the patient, ordering tests and/or medications and counseling the patient, documenting clinical information in the EHR or other health record, independently interpreting results and communicating results to the patient/family, discussing treatment and goals, answering patient's questions and coordinating care.  Cc:  [name], MD  Sharene Butters 08/23/2022 1:38 PM

## 2022-08-30 DIAGNOSIS — Z794 Long term (current) use of insulin: Secondary | ICD-10-CM | POA: Diagnosis not present

## 2022-08-30 DIAGNOSIS — E1065 Type 1 diabetes mellitus with hyperglycemia: Secondary | ICD-10-CM | POA: Diagnosis not present

## 2022-08-31 ENCOUNTER — Other Ambulatory Visit: Payer: Self-pay | Admitting: Endocrinology

## 2022-08-31 DIAGNOSIS — E1065 Type 1 diabetes mellitus with hyperglycemia: Secondary | ICD-10-CM

## 2022-09-01 ENCOUNTER — Other Ambulatory Visit: Payer: Self-pay | Admitting: Physician Assistant

## 2022-09-18 ENCOUNTER — Other Ambulatory Visit: Payer: Self-pay | Admitting: Endocrinology

## 2022-09-18 DIAGNOSIS — E1065 Type 1 diabetes mellitus with hyperglycemia: Secondary | ICD-10-CM

## 2022-09-25 ENCOUNTER — Other Ambulatory Visit: Payer: Self-pay | Admitting: Endocrinology

## 2022-09-26 DIAGNOSIS — L814 Other melanin hyperpigmentation: Secondary | ICD-10-CM | POA: Diagnosis not present

## 2022-09-26 DIAGNOSIS — L821 Other seborrheic keratosis: Secondary | ICD-10-CM | POA: Diagnosis not present

## 2022-09-26 DIAGNOSIS — D225 Melanocytic nevi of trunk: Secondary | ICD-10-CM | POA: Diagnosis not present

## 2022-09-26 DIAGNOSIS — M71342 Other bursal cyst, left hand: Secondary | ICD-10-CM | POA: Diagnosis not present

## 2022-10-05 ENCOUNTER — Ambulatory Visit (HOSPITAL_BASED_OUTPATIENT_CLINIC_OR_DEPARTMENT_OTHER): Payer: Medicare Other | Admitting: Psychiatry

## 2022-10-05 DIAGNOSIS — G4701 Insomnia due to medical condition: Secondary | ICD-10-CM | POA: Diagnosis not present

## 2022-10-05 DIAGNOSIS — F332 Major depressive disorder, recurrent severe without psychotic features: Secondary | ICD-10-CM

## 2022-10-05 DIAGNOSIS — F4010 Social phobia, unspecified: Secondary | ICD-10-CM | POA: Diagnosis not present

## 2022-10-05 MED ORDER — LAMOTRIGINE 100 MG PO TABS: 100.0000 mg | ORAL_TABLET | Freq: Two times a day (BID) | ORAL | 0 refills | Status: DC

## 2022-10-05 MED ORDER — TRAZODONE HCL 100 MG PO TABS: 200.0000 mg | ORAL_TABLET | Freq: Every day | ORAL | 0 refills | Status: DC

## 2022-10-05 NOTE — Progress Notes (Signed)
BH MD/PA/NP OP Progress Note  10/05/2022 6:05 PM Leslie Lane  MRN:  [id number]  Chief Complaint:  Chief Complaint  Patient presents with   Depression   HPI: "The same as usual. I can't stand myself because I feel awful and miserable". Leslie Lane shares that her lack of energy and motivation due to her fluctuating blood sugars. On days that her sugars are high she feels bad. Leslie Lane has to force herself to do things. She feels relief when she gets out of the house for a few hours each day and really enjoys it. Aiyona admits to having a lot of friction with her mom. Leslie Lane knows her mom doesn't mean anything and has always been in total control. It agitates her but Leslie Lane can't help the way she feels. She gets irritated and depressed every day. The level of depression is 7-8/10 (10 being the worst).  Her sleep is good. Her appetite is poor during the day but at night it significantly increases. Her concentration is poor. Her family blame her for everything and Leslie Lane feels worthless. She has restlessness and can't sit still. Leslie Lane wishes to be dead when her mom gets on Leslie Lane about cleaning or doing something wrong. She denies SI/HI. Her anxiety is manageable.   Leslie Lane smokes daily and has cut back to 2 cartons a month. It used to be 4.     Visit Diagnosis:    ICD-10-CM   1. Social anxiety disorder  F40.10 lamoTRIgine (LAMICTAL) 100 MG tablet    2. Major depressive disorder, recurrent, severe without psychotic features  F33.2 lamoTRIgine (LAMICTAL) 100 MG tablet    traZODone (DESYREL) 100 MG tablet    3. Insomnia due to medical condition  G47.01 traZODone (DESYREL) 100 MG tablet      Past Psychiatric History: no updates per patient  Past Medical History:  Past Medical History:  Diagnosis Date   Abdominal pain 02/28/2016   Abnormal CT scan, bladder    Noted 07/28/13 CT - s/p uro eval 08/2013 [place] -    Acquired trigger finger of left ring finger 02/18/2019   Injected February 18, 2019 and March  2022   Allergic rhinitis    Anemia    Arthritis    Bipolar disorder 07/05/2010   Cataract    Chronic lower back pain 07/05/2010   Chronic neck pain    Diabetic gastroparesis associated with type 1 diabetes mellitus 10/24/2016   Fibromyalgia    Generalized anxiety disorder 04/27/2014   GERD (gastroesophageal reflux disease) 07/05/2010   Hepatitis B virus infection 07/05/2010   History of substance abuse    opiates; cocaine   Hypoglycemia unawareness associated with type 1 diabetes mellitus 11/23/2020   Insomnia 04/13/2015   Major depressive disorder 07/28/2014   Migraine headache 07/05/2010   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  It hasn't happened in a long time."   Smokers' cough 07/05/2010   Tubular adenoma of colon 2018   Type 1 diabetes mellitus with complication [id number]   WBC decreased 03/27/2017    Past Surgical History:  Procedure Laterality Date   CESAREAN SECTION     COLONOSCOPY     TUBAL LIGATION     UPPER GASTROINTESTINAL ENDOSCOPY  Family Psychiatric and Medical History:  Family History  Problem Relation Age of Onset   Arthritis Mother    Diverticulosis Mother    Kidney disease Mother    Hyperlipidemia Mother    Arthritis Father    Kidney disease Father    Kidney cancer Father    Bladder Cancer Sister    Colon cancer Sister 76   Celiac disease Sister    Heart attack Brother    Kidney cancer Paternal Aunt    Diabetes Niece    Hyperlipidemia Other    Anxiety disorder Neg Hx    Bipolar disorder Neg Hx    Depression Neg Hx    Breast cancer Neg Hx    Esophageal cancer Neg Hx    Stomach cancer Neg Hx    Rectal cancer Neg Hx     Social History:  Social History    Socioeconomic History   Marital status: Divorced    Spouse name: Not on file   Number of children: 1   Years of education: 12   Highest education level: High school graduate  Occupational History   Occupation: Disability  Tobacco Use   Smoking status: Every Day    Packs/day: 1.50    Years: 50.00    Additional pack years: 0.00    Total pack years: 75.00    Types: Cigarettes   Smokeless tobacco: Never   Tobacco comments:    Reports she has cut back to a cartoon that now lasts 2 weeks.   Vaping Use   Vaping Use: Former  Substance and Sexual Activity   Alcohol use: Yes    Alcohol/week: 7.0 standard drinks of alcohol    Types: 7 Cans of beer per week    Comment: 1 beer nightly   Drug use: Not Currently    Types: Cocaine    Comment: past opiate and cocaine abuse   Sexual activity: Not Currently    Birth control/protection: Surgical, Post-menopausal  Other Topics Concern   Not on file  Social History Narrative   Divorced, disabled   Lives with parents   Right handed    Retired   Caffeine prn   Social Determinants of Health   Financial Resource Strain: Low Risk  (03/14/2022)   Overall Financial Resource Strain (CARDIA)    Difficulty of Paying Living Expenses: Not hard at all  Food Insecurity: No Food Insecurity (03/14/2022)   Hunger Vital Sign    Worried About Running Out of Food in the Last Year: Never true    Ran Out of Food in the Last Year: Never true  Transportation Needs: No Transportation Needs (03/14/2022)   PRAPARE - Hydrologist (Medical): No    Lack of Transportation (Non-Medical): No  Physical Activity: Sufficiently Active (03/14/2022)   Exercise Vital Sign    Days of Exercise per Week: 5 days    Minutes of Exercise per Session: 30 min  Stress: Stress Concern Present (03/14/2022)   [place]    Feeling of Stress : To some extent  Social Connections: Socially  Integrated (03/14/2022)   Social Connection and Isolation Panel [NHANES]    Frequency of Communication with Friends and Family: More than three times a week    Frequency of Social Gatherings with Friends and Family: More than three times a week    Attends Religious Services: More than 4 times per year    Active Member of [employer] or Organizations: Yes  Attends Archivist Meetings: More than 4 times per year    Marital Status: Married    Allergies:  Allergies  Allergen Reactions   Penicillins Anaphylaxis   Sulfonamide Derivatives Anaphylaxis    Metabolic Disorder Labs: Lab Results  Component Value Date   HGBA1C 7.1 (H) 08/07/2022   No results found for: "PROLACTIN" Lab Results  Component Value Date   CHOL 209 (H) 06/07/2021   TRIG 64.0 06/07/2021   HDL 115.00 06/07/2021   CHOLHDL 2 06/07/2021   VLDL 12.8 06/07/2021   LDLCALC 81 06/07/2021   LDLCALC 84 09/16/2020   Lab Results  Component Value Date   TSH 0.71 06/07/2021   TSH 1.78 06/13/2018    Therapeutic Level Labs: No results found for: "LITHIUM" No results found for: "VALPROATE" No results found for: "CBMZ"  Current Medications: Current Outpatient Medications  Medication Sig Dispense Refill   Accu-Chek Softclix Lancets lancets Use as instructed to check blood sugar 4 times per day dx code E10.65 400 each 1   Alcohol Swabs (B-D SINGLE USE SWABS REGULAR) PADS USE FOUR TIMES DAILY 400 each 2   aspirin EC 81 MG tablet Take 81 mg by mouth every morning.     B-D ULTRAFINE III SHORT PEN 31G X 8 MM MISC USE TO INJECT WITH PRE-FILLED PEN SYRINGE SUB-Q DAILY 400 each 2   Blood Glucose Calibration (ACCU-CHEK AVIVA) SOLN USE AS DIRECTED 1 each 1   Blood Glucose Monitoring Suppl (ACCU-CHEK AVIVA PLUS) w/Device KIT USE AS DIRECTED 1 kit 0   Clobetasol Propionate 0.05 % shampoo Apply topically.     Continuous Blood Gluc Receiver (FREESTYLE LIBRE 3 READER) DEVI 1 Device by Does not apply route continuous. 1 each 0    Continuous Blood Gluc Sensor (FREESTYLE LIBRE 3 SENSOR) MISC Place 1 sensor on the skin every 14 days. Use to check glucose continuously 6 each 3   donepezil (ARICEPT) 10 MG tablet TAKE 1 TABLET BY MOUTH AT NIGHT 30 tablet 4   doxycycline (VIBRAMYCIN) 100 MG capsule Take 100 mg by mouth daily.     fexofenadine (ALLEGRA) 180 MG tablet Take 180 mg by mouth daily.     gabapentin (NEURONTIN) 300 MG capsule Take 1 capsule (300 mg total) by mouth 3 (three) times daily. 90 capsule 5   glucose blood (ACCU-CHEK AVIVA PLUS) test strip CHECK BLOOD SUGAR 4 TIMES DAILY 400 strip 3   GVOKE HYPOPEN 2-PACK 1 MG/0.2ML SOAJ INJECT 1 MG UNDER THE SKIN AS NEEDED FOR LOW BLOOD SUGAR. 0.4 mL 2   hyoscyamine (LEVBID) 0.375 MG 12 hr tablet Take 1 tablet (0.375 mg total) by mouth 2 (two) times daily. 60 tablet 11   insulin detemir (LEVEMIR FLEXTOUCH) 100 UNIT/ML FlexPen Inject 3 units in the morning and 6 units at bedtime (Patient taking differently: Inject 3 units in the morning and 9 units at bedtime) 15 mL 5   insulin lispro (HUMALOG KWIKPEN) 100 UNIT/ML KwikPen INJECT SUBCUTANEOUSLY 1 TO  3 UNITS 3 TIMES DAILY FOR  LARGE MEALS 15 mL 3   Insulin Pen Needle 32G X 4 MM MISC Use inject insulin tid 300 each 2   lipase/protease/amylase (CREON) 36000 UNITS CPEP capsule Take 2 capsules by mouth three times a day with meals and 1 capsule by mouth twice daily with snacks 240 capsule 11   lovastatin (MEVACOR) 40 MG tablet TAKE 1 TABLET BY MOUTH AT  BEDTIME 90 tablet 3   Magnesium 500 MG TABS Take 1 tablet by mouth daily.  Multiple Vitamin (MULTIVITAMIN) tablet Take 1 tablet by mouth daily. Reported on 11/22/2015     Omega-3 Fatty Acids (FISH OIL) 1000 MG CAPS Take 1 capsule by mouth daily.     omeprazole (PRILOSEC) 40 MG capsule TAKE 1 CAPSULE(40 MG) BY MOUTH IN THE MORNING AND AT BEDTIME 180 capsule 0   triamcinolone cream (KENALOG) 0.1 %      umeclidinium-vilanterol (ANORO ELLIPTA) 62.5-25 MCG/ACT AEPB INHALE 1 PUFF INTO THE  LUNGS DAILY 60 each 5   vitamin E 1000 UNIT capsule Take 1,000 Units by mouth daily.     Continuous Blood Gluc Sensor (FREESTYLE LIBRE 2 SENSOR) MISC Use to check blood sugar daily (Patient not taking: Reported on 10/05/2022) 2 each 12   escitalopram (LEXAPRO) 5 MG tablet Take 1 tablet (5 mg total) by mouth daily. (Patient not taking: Reported on 10/05/2022) 50 tablet 0   lamoTRIgine (LAMICTAL) 100 MG tablet Take 1 tablet (100 mg total) by mouth 2 (two) times daily. 180 tablet 0   traZODone (DESYREL) 100 MG tablet Take 2 tablets (200 mg total) by mouth at bedtime. 180 tablet 0   No current facility-administered medications for this visit.     Musculoskeletal: Strength & Muscle Tone: within normal limits Gait & Station: normal Patient leans: N/A  Psychiatric Specialty Exam: Review of Systems  Blood pressure (!) 161/76, pulse 67, resp. rate 18, height 5\' 2"  (1.575 m), weight 110 lb 6.4 oz (50.1 kg), SpO2 96 %.Body mass index is 20.19 kg/m.  General Appearance: Fairly Groomed and Neat  Eye Contact:  Good  Speech:  Clear and Coherent and Normal Rate  Volume:  Normal  Mood:  Anxious and Depressed  Affect:  Congruent  Thought Process:  Coherent and Descriptions of Associations: Circumstantial  Orientation:  Full (Time, Place, and Person)  Thought Content: Logical   Suicidal Thoughts:  No  Homicidal Thoughts:  No  Memory:  Immediate;   Fair  Judgement:  Fair  Insight:  Fair  Psychomotor Activity:  Restlessness  Concentration:  Concentration: Good  Recall:  Good  Fund of Knowledge: Good  Language: Good  Akathisia:  No  Handed:  Right  AIMS (if indicated): not done  Assets:  Communication Skills Desire for Improvement Financial Resources/Insurance Housing Resilience Talents/Skills Transportation Vocational/Educational  ADL's:  Intact  Cognition: WNL  Sleep:  Good   Screenings: AUDIT    Flowsheet Row Clinical Support from 03/14/2022 in [place] at [facility]  Alcohol Use Disorder Identification Test Final Score (AUDIT) 4      GAD-7    Flowsheet Row Office Visit from 12/16/2020 in [place] at [place] Visit from 06/17/2019 in [place] Visit from 11/27/2017 in [place]  Total GAD-7 Score 17 19 18       [place] Office Visit from 08/23/2022 in [facility] Office Visit from 12/03/2020 in [facility] Office Visit from 05/30/2017 in [place]  Total Score (max 30 points ) 30 22 27       PHQ2-9    [place] Visit from 10/05/2022 in [facility] Office Visit from 04/06/2022 in [facility] Clinical Support from 03/14/2022 in [employer] [facility] [place] Visit from 12/23/2021 in [place] [facility] [place] Visit from 12/01/2021 in [facility]  PHQ-2  Total Score 3 6 2 2 6   PHQ-9 Total Score 19 24 2 2 20       Flowsheet Row Office Visit from 10/05/2022 in [facility] [facility] ED from 06/14/2022 in [facility] Emergency Department at [facility] Office Visit from 04/06/2022 in [place] Error: Q3, 4, or 5 should not be populated when Q2 is No No Risk Error: Q3, 4, or 5 should not be populated when Q2 is No        Assessment and Plan: Confidentiality and exclusions reviewed with pt who verbalized understanding.   Medication management with supportive therapy. Risks and benefits, side effects and alternative treatment options discussed with patient. Pt was given an opportunity to ask questions about medication, illness, and treatment. All current psychiatric medications have been reviewed and discussed  with the patient and adjusted as clinically appropriate. The patient has been provided an accurate and updated list of the medications being now prescribed. Pt verbalized understanding and verbal consent obtained for treatment.   Pt is aware that these meds carry a teratogenic risk. Pt will discuss plan of action if she does or plans to become pregnant in the future.  Status of current problems: ongoing depression symptoms   Meds: d/c Lexapro. Leslie Lane took it for a month and stopped because it was not working  Leslie Lane is scared to try any new medication at this time and wants to continue Lamictal and Trazodone at current doses.    Labs: none  Therapy: brief supportive therapy provided. Discussed psychosocial stressors in detail.        F/up in 3-4 months or sooner if needed  The duration of this appointment visit was 20 minutes of face-to-face time with the patient.  Greater than 50% of this time was spent in counseling, explanation of  diagnosis, planning of further management, and coordination of care    Collaboration of Care: Collaboration of Care: Other none  Patient/Guardian was advised Release of Information must be obtained prior to any record release in order to collaborate their care with an outside provider. Patient/Guardian was advised if they have not already done so to contact the registration department to sign all necessary forms in order for [place] to release information regarding their care.   Consent: Patient/Guardian gives verbal consent for treatment and assignment of benefits for services provided during this visit. Patient/Guardian expressed understanding and agreed to proceed.    Charlcie Cradle, MD 10/05/2022, 6:05 PM

## 2022-11-02 ENCOUNTER — Other Ambulatory Visit: Payer: Self-pay | Admitting: Internal Medicine

## 2022-11-02 ENCOUNTER — Other Ambulatory Visit: Payer: Self-pay | Admitting: Physician Assistant

## 2022-11-03 ENCOUNTER — Other Ambulatory Visit: Payer: Self-pay

## 2022-11-03 ENCOUNTER — Telehealth: Payer: Self-pay

## 2022-11-03 ENCOUNTER — Telehealth: Payer: Self-pay | Admitting: Endocrinology

## 2022-11-03 MED ORDER — "INSULIN SYRINGE-NEEDLE U-100 31G X 5/16"" 0.3 ML MISC": 2 refills | Status: AC

## 2022-11-03 NOTE — Telephone Encounter (Signed)
Patient given samples of Novolog vial

## 2022-11-03 NOTE — Telephone Encounter (Signed)
Patient is in office saying that she will be out of insulin lispro insulin lispro (HUMALOG KWIKPEN) 100 UNIT/ML KwikPen And the pharmacy is telling her that it has been shipped, gut she is afraid she will be out before it arrives.  Patient wants to know what she needs to do.

## 2022-11-06 ENCOUNTER — Other Ambulatory Visit: Payer: Self-pay | Admitting: Internal Medicine

## 2022-11-06 DIAGNOSIS — Z0000 Encounter for general adult medical examination without abnormal findings: Secondary | ICD-10-CM

## 2022-11-09 ENCOUNTER — Other Ambulatory Visit: Payer: Medicare Other

## 2022-11-10 ENCOUNTER — Other Ambulatory Visit (INDEPENDENT_AMBULATORY_CARE_PROVIDER_SITE_OTHER): Payer: Medicare Other

## 2022-11-10 DIAGNOSIS — E7800 Pure hypercholesterolemia, unspecified: Secondary | ICD-10-CM | POA: Diagnosis not present

## 2022-11-10 DIAGNOSIS — E1065 Type 1 diabetes mellitus with hyperglycemia: Secondary | ICD-10-CM | POA: Diagnosis not present

## 2022-11-10 LAB — COMPREHENSIVE METABOLIC PANEL WITH GFR
ALT: 17 U/L (ref 0–35)
AST: 27 U/L (ref 0–37)
Albumin: 4.2 g/dL (ref 3.5–5.2)
Alkaline Phosphatase: 43 U/L (ref 39–117)
BUN: 20 mg/dL (ref 6–23)
CO2: 32 meq/L (ref 19–32)
Calcium: 9.6 mg/dL (ref 8.4–10.5)
Chloride: 100 meq/L (ref 96–112)
Creatinine, Ser: 0.83 mg/dL (ref 0.40–1.20)
GFR: 72.37 mL/min
Glucose, Bld: 127 mg/dL — ABNORMAL HIGH (ref 70–99)
Potassium: 4.5 meq/L (ref 3.5–5.1)
Sodium: 139 meq/L (ref 135–145)
Total Bilirubin: 0.3 mg/dL (ref 0.2–1.2)
Total Protein: 6.9 g/dL (ref 6.0–8.3)

## 2022-11-10 LAB — LIPID PANEL
Cholesterol: 170 mg/dL (ref 0–200)
HDL: 83.7 mg/dL (ref >39.00)
LDL Cholesterol: 73 mg/dL (ref 0–99)
NonHDL: 86.32
Total CHOL/HDL Ratio: 2
Triglycerides: 67 mg/dL (ref 0.0–149.0)
VLDL: 13.4 mg/dL (ref 0.0–40.0)

## 2022-11-10 LAB — HEMOGLOBIN A1C: Hgb A1c MFr Bld: 7.1 % — ABNORMAL HIGH (ref 4.6–6.5)

## 2022-11-13 ENCOUNTER — Ambulatory Visit: Payer: Medicare Other | Admitting: Endocrinology

## 2022-11-13 ENCOUNTER — Encounter: Payer: Self-pay | Admitting: Endocrinology

## 2022-11-13 VITALS — BP 120/64 | HR 73 | Ht 62.0 in | Wt 109.6 lb

## 2022-11-13 DIAGNOSIS — E1065 Type 1 diabetes mellitus with hyperglycemia: Secondary | ICD-10-CM

## 2022-11-13 DIAGNOSIS — E7800 Pure hypercholesterolemia, unspecified: Secondary | ICD-10-CM | POA: Diagnosis not present

## 2022-11-13 NOTE — Patient Instructions (Addendum)
LEVEMIR 4 IN AM AND 9 IN PM  Check blood sugars on waking up days  Also check blood sugars about 2 hours after meals and do this after different meals by rotation  Recommended blood sugar levels on waking up are 90-130 and about 2 hours after meal is 130-180  Please bring your blood sugar monitor to each visit, thank you

## 2022-11-13 NOTE — Progress Notes (Signed)
Patient ID: Leslie Lane, female   DOB: [birth year], 67 y.o.   MRN: [id number]  Reason for Appointment: Endocrinology follow-up   History of Present Illness    Diagnosis: Type 1 DIABETES MELITUS, diagnosed 1967      She has had labile blood sugar control over the last several years even though A1c has been usually around 7% She has had less lability and hypoglycemia with taking b.i.d. Lantus compared once a day She has been very sensitive to fast acting insulin and frequently does not require mealtime coverage She cannot tolerate Toujeo because of reported episode of headache, bloating and nausea with the first dose  RECENT history:  Insulin regimen: LEVEMIR 5 units in the morning and 11 at 11 pm  HUMALOG 1-3 units before meals and as needed  Current blood sugar patterns, control and problems identified:  Her A1c is 7.1 as before  She has generally poor control with significant hypoglycemia more often She has increased her Levemir doses on her own both morning and evening and especially the evening dose by 2 units; she increased this because she says she was snacking more after dinner Usually will have snacks like cheese puffs around 8-10 PM but will not cover these with Humalog However she did not understand that this is causing low sugars overnight especially in the last few days from increasing the evening Levemir She is usually taking 1-3 units of Humalog with most meals and will reduce it only if blood sugars are near normal or if she is planning to be more active Periodically will have low sugars in the afternoons also but not as consistently Sporadically will have significant hyperglycemic episodes but sometimes these are related to overtreating low sugars Her weight is down slightly   Mealtimes: Breakfast 8-8.30 am, lunch 12 noon , dinner 5 pm  Hypoglycemia: Symptoms may be absent with low blood sugars and does not have early warning symptoms. Gets confused  and she frequently depends on her mother to recognize low sugars and treat them.  Her mother knows how to give Glucagon injection  She will treat her low blood sugars usually with juice, does carry glucose tablets when not at home  Blood sugar patterns as follows from [facility] to download for the last 2 weeks  The date on her monitor is not set correctly Blood sugars are exhibiting increased variability with GV 49 compared to 40 overnight blood sugars are highly variable with periods of both hyperglycemia and hypoglycemia although hyperglycemia has occurred only on 2-3 occasions, generally preceded by low normal or low sugars Has had significant hypoglycemia on nighttime readings in the last 4 to 5 days more consistently and sometimes for longer period of time HYPERGLYCEMIC episodes are seen periodically at all times although less in the last week and more consistently in the mornings and midday and sometimes late evening  On an average blood sugars are still within the target range at all times with higher readings midmorning and late evening  Hypoglycemia during the day is also inconsistent but may occur at any time except late evening  CGM use % of time   2-week average/GV 145/49  Time in range  57      %  % Time Above 180 21+9  % Time above 250   % Time Below 70 13     PRE-MEAL Fasting Lunch Dinner Bedtime Overall  Glucose range:       Averages: 130   160s 7  POST-MEAL PC Breakfast PC Lunch PC Dinner  Glucose range:     Averages: 175 144 139   Previously  CGM use % of time   2-week average/GV 156  Time in range      63  %  % Time Above 180 22  % Time above 250 9  % Time Below 70 6     Wt Readings from Last 3 Encounters:  11/13/22 109 lb 9.6 oz (49.7 kg)  08/23/22 112 lb (50.8 kg)  08/11/22 112 lb (50.8 kg)    Lab Results  Component Value Date   HGBA1C 7.1 (H) 11/10/2022   HGBA1C 7.1 (H) 08/07/2022   HGBA1C 7.0 (A) 04/24/2022   Lab Results  Component Value  Date   MICROALBUR <0.7 07/04/2022   LDLCALC 73 11/10/2022   CREATININE 0.83 11/10/2022        Allergies as of 11/13/2022       Reactions   Penicillins Anaphylaxis   Sulfonamide Derivatives Anaphylaxis        Medication List        Accurate as of Nov 13, 2022  2:53 PM. If you have any questions, ask your nurse or doctor.          Accu-Chek Aviva Plus test strip Generic drug: glucose blood CHECK BLOOD SUGAR 4 TIMES DAILY   Accu-Chek Aviva Plus w/Device Kit USE AS DIRECTED   Accu-Chek Aviva Soln USE AS DIRECTED   Accu-Chek Softclix Lancets lancets Use as instructed to check blood sugar 4 times per day dx code E10.65   Anoro Ellipta 62.5-25 MCG/ACT Aepb Generic drug: umeclidinium-vilanterol INHALE 1 PUFF INTO THE LUNGS DAILY   aspirin EC 81 MG tablet Take 81 mg by mouth every morning.   B-D SINGLE USE SWABS REGULAR Pads USE FOUR TIMES DAILY   Clobetasol Propionate 0.05 % shampoo Apply topically.   donepezil 10 MG tablet Commonly known as: ARICEPT TAKE 1 TABLET BY MOUTH AT NIGHT   doxycycline 100 MG capsule Commonly known as: VIBRAMYCIN Take 100 mg by mouth daily.   escitalopram 5 MG tablet Commonly known as: LEXAPRO Take 1 tablet (5 mg total) by mouth daily.   fexofenadine 180 MG tablet Commonly known as: ALLEGRA Take 180 mg by mouth daily.   Fish Oil 1000 MG Caps Take 1 capsule by mouth daily.   FreeStyle Libre 2 Sensor Misc Use to check blood sugar daily   FreeStyle Libre 3 Sensor Misc Place 1 sensor on the skin every 14 days. Use to check glucose continuously   FreeStyle [place] [address] 3 Reader [facility] 1 Device by Does not apply route continuous.   gabapentin 300 MG capsule Commonly known as: Neurontin Take 1 capsule (300 mg total) by mouth 3 (three) times daily.   Gvoke HypoPen 2-Pack 1 MG/0.2ML Soaj Generic drug: Glucagon INJECT 1 MG UNDER THE SKIN AS NEEDED FOR LOW BLOOD SUGAR.   hyoscyamine 0.375 MG 12 hr tablet Commonly known as:  Levbid Take 1 tablet (0.375 mg total) by mouth 2 (two) times daily.   insulin lispro 100 UNIT/ML KwikPen Commonly known as: HumaLOG KwikPen INJECT SUBCUTANEOUSLY 1 TO  3 UNITS 3 TIMES DAILY FOR  LARGE MEALS   Insulin Pen Needle 32G X 4 MM Misc Use inject insulin tid   B-D ULTRAFINE III SHORT PEN 31G X 8 MM Misc Generic drug: Insulin Pen Needle USE TO INJECT WITH PRE-FILLED PEN SYRINGE SUB-Q DAILY   Insulin Syringe-Needle U-100 31G X 5/16" 0.3 ML Misc Commonly known as: B-D  INSULIN SYRINGE Use to administer insulin   lamoTRIgine 100 MG tablet Commonly known as: LAMICTAL Take 1 tablet (100 mg total) by mouth 2 (two) times daily.   Levemir FlexTouch 100 UNIT/ML FlexPen Generic drug: insulin detemir Inject 3 units in the morning and 6 units at bedtime What changed: additional instructions   lipase/protease/amylase 33295 UNITS Cpep capsule Commonly known as: Creon Take 2 capsules by mouth three times a day with meals and 1 capsule by mouth twice daily with snacks   lovastatin 40 MG tablet Commonly known as: MEVACOR TAKE 1 TABLET BY MOUTH AT  BEDTIME   Magnesium 500 MG Tabs Take 1 tablet by mouth daily.   multivitamin tablet Take 1 tablet by mouth daily. Reported on 11/22/2015   omeprazole 40 MG capsule Commonly known as: PRILOSEC TAKE 1 CAPSULE(40 MG) BY MOUTH IN THE MORNING AND AT BEDTIME   traZODone 100 MG tablet Commonly known as: DESYREL Take 2 tablets (200 mg total) by mouth at bedtime.   triamcinolone cream 0.1 % Commonly known as: KENALOG   vitamin E 1000 UNIT capsule Take 1,000 Units by mouth daily.        Allergies:  Allergies  Allergen Reactions   Penicillins Anaphylaxis   Sulfonamide Derivatives Anaphylaxis    Past Medical History:  Diagnosis Date   Abdominal pain 02/28/2016   Abnormal CT scan, bladder    Noted 07/28/13 CT - s/p uro eval 08/2013 [place] -    Acquired trigger finger of left ring finger 02/18/2019   Injected February 18, 2019  and March 2022   Allergic rhinitis    Anemia    Arthritis    Bipolar disorder 07/05/2010   Cataract    Chronic lower back pain 07/05/2010   Chronic neck pain    Diabetic gastroparesis associated with type 1 diabetes mellitus 10/24/2016   Fibromyalgia    Generalized anxiety disorder 04/27/2014   GERD (gastroesophageal reflux disease) 07/05/2010   Hepatitis B virus infection 07/05/2010   History of substance abuse    opiates; cocaine   Hypoglycemia unawareness associated with type 1 diabetes mellitus 11/23/2020   Insomnia 04/13/2015   Major depressive disorder 07/28/2014   Migraine headache 07/05/2010   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  It hasn't happened in a long time."   Smokers' cough 07/05/2010   Tubular adenoma of colon 2018   Type 1 diabetes mellitus with complication 02/06/2013   WBC decreased 03/27/2017    Past Surgical History:  Procedure Laterality Date   CESAREAN SECTION     COLONOSCOPY     TUBAL LIGATION     UPPER GASTROINTESTINAL ENDOSCOPY      Family History  Problem Relation Age of Onset   Arthritis Mother    Diverticulosis Mother    Kidney disease Mother    Hyperlipidemia Mother    Arthritis Father    Kidney disease Father    Kidney cancer Father    Bladder Cancer Sister    Colon cancer Sister 70   Celiac disease Sister    Heart attack Brother    Kidney cancer Paternal Aunt    Diabetes Niece    Hyperlipidemia Other    Anxiety disorder Neg Hx    Bipolar  disorder Neg Hx    Depression Neg Hx    Breast cancer Neg Hx    Esophageal cancer Neg Hx    Stomach cancer Neg Hx    Rectal cancer Neg Hx     Social History:  reports that she has been smoking cigarettes. She has  a 75.00 pack-year smoking history. She has never used smokeless tobacco. She reports current alcohol use of about 7.0 standard drinks of alcohol per week. She reports that she does not currently use drugs after having used the following drugs: Cocaine.  Review of Systems:  She has [facility]  Had gastroparesis previously treated with Reglan  Also being followed by gastroenterologist for abdominal pain and pancreatic insufficiency For diarrhea she is taking Creon    NEUROPATHY: She has history of burning in her legs and feet; gabapentin as needed She has been prescribed Cymbalta with some benefit   DEPRESSION: She has had long-standing depression and anxiety on long-term treatment  HYPERLIPIDEMIA: The lipid abnormality consists of elevated LDL   Is  on lovastatin 40 mg with  good control, prescribed by PCP   Lab Results  Component Value Date   CHOL 170 11/10/2022   HDL 83.70 11/10/2022   LDLCALC 73 11/10/2022   LDLDIRECT 101.7 03/26/2012   TRIG 67.0 11/10/2022   CHOLHDL 2 11/10/2022    Memory difficulties: She was given Aricept by the neurologist, is taking 10 mg    Examination:   BP 120/64 (BP Location: Left Arm, Patient Position: Sitting, Cuff Size: Normal)   Pulse 73   Ht 5\' 2"  (1.575 m)   Wt 109 lb 9.6 oz (49.7 kg)   BMI 20.05 kg/m   Body mass index is 20.05 kg/m.    ASSESSMENT/ PLAN:   Diabetes type 1 longstanding  See history of present illness for detailed discussion of current insulin, blood sugar patterns, problems identified  A1c has been now  stable 7.1  Her blood sugar is difficult to control from various reasons including: Variable dietary habits She is making inappropriate insulin adjustment especially basal with higher doses because of evening snacks and this is causing overnight hypoglycemia recently Likely taking more Humalog than she needs at most meals and keeping postprandial readings too tightly  controlled Generally overtreating low sugars Hypoglycemia unawareness Not a good candidate for an insulin pump as she has difficulty with technology issues  LIPIDS: Well-controlled with LDL 73  Recommendations:  She will reduce her Levemir by 2 units in the evening and 1 unit in the morning New doses will be 4 units in the morning and 9 in the evening Discussed that she cannot adjust Levemir based on what she is eating and only take extra Humalog for large carbohydrate snacks Also to avoid taking more than 1 or 2 units for most meals to avoid hypoglycemia She should try and use simple sugars and not overtreat low normal or low sugars with food Discussed targets for blood sugars both before and after meals and okay to have blood sugars of 280 after meals Continue lovastatin unchanged  Total visit time including counseling = 30 minutes  Patient Instructions  LEVEMIR 4 IN AM AND 9 IN PM  Check blood sugars on waking up days  Also check blood sugars about 2 hours after meals and do this after different meals by rotation  Recommended blood sugar levels on waking up are 90-130 and about 2 hours after meal is 130-180  Please  bring your blood sugar monitor to each visit, thank you       Reather Littler 11/13/2022, 2:53 PM

## 2022-11-14 ENCOUNTER — Encounter: Payer: Self-pay | Admitting: Endocrinology

## 2022-11-15 ENCOUNTER — Ambulatory Visit
Admission: RE | Admit: 2022-11-15 | Discharge: 2022-11-15 | Disposition: A | Discharge status: Home / Self Care | Payer: Medicare Other | Source: Ambulatory Visit | Attending: Internal Medicine | Admitting: Internal Medicine

## 2022-11-15 DIAGNOSIS — Z1231 Encounter for screening mammogram for malignant neoplasm of breast: Secondary | ICD-10-CM | POA: Diagnosis not present

## 2022-11-15 DIAGNOSIS — Z0000 Encounter for general adult medical examination without abnormal findings: Secondary | ICD-10-CM

## 2022-11-17 ENCOUNTER — Encounter: Payer: Self-pay | Admitting: Internal Medicine

## 2022-11-17 DIAGNOSIS — E1065 Type 1 diabetes mellitus with hyperglycemia: Secondary | ICD-10-CM | POA: Diagnosis not present

## 2022-11-17 LAB — HM MAMMOGRAPHY

## 2022-11-25 DIAGNOSIS — E1065 Type 1 diabetes mellitus with hyperglycemia: Secondary | ICD-10-CM | POA: Diagnosis not present

## 2022-12-05 ENCOUNTER — Ambulatory Visit (INDEPENDENT_AMBULATORY_CARE_PROVIDER_SITE_OTHER): Payer: Medicare Other | Admitting: Internal Medicine

## 2022-12-05 VITALS — BP 132/62 | HR 71 | Temp 98.4°F | Ht 62.0 in | Wt 111.0 lb

## 2022-12-05 DIAGNOSIS — J3089 Other allergic rhinitis: Secondary | ICD-10-CM

## 2022-12-05 DIAGNOSIS — I515 Myocardial degeneration: Secondary | ICD-10-CM

## 2022-12-05 DIAGNOSIS — J410 Simple chronic bronchitis: Secondary | ICD-10-CM

## 2022-12-05 DIAGNOSIS — R0789 Other chest pain: Secondary | ICD-10-CM

## 2022-12-05 DIAGNOSIS — M65342 Trigger finger, left ring finger: Secondary | ICD-10-CM | POA: Diagnosis not present

## 2022-12-05 MED ORDER — CYCLOBENZAPRINE HCL 5 MG PO TABS: 5.0000 mg | ORAL_TABLET | Freq: Three times a day (TID) | ORAL | 1 refills | Status: DC | PRN

## 2022-12-05 MED ORDER — CETIRIZINE HCL 10 MG PO TABS: 10.0000 mg | ORAL_TABLET | Freq: Every day | ORAL | 3 refills | Status: DC

## 2022-12-05 NOTE — Progress Notes (Unsigned)
   Subjective:   Patient ID: Leslie Lane, female    DOB: 1956/04/09, 67 y.o.   MRN: [id number]  HPI The patient is a 67 YO female coming in for follow up. Some intermittent chest pains but not that lately. Having chronic pain in joints especially hands, having allergies and unable to afford otc medications.   Review of Systems  Constitutional: Negative.   HENT:  Positive for congestion.   Eyes: Negative.   Respiratory:  Positive for chest tightness. Negative for cough and shortness of breath.   Cardiovascular:  Negative for chest pain, palpitations and leg swelling.  Gastrointestinal:  Negative for abdominal distention, abdominal pain, constipation, diarrhea, nausea and vomiting.  Endocrine:       Low sugars  Musculoskeletal:  Positive for arthralgias and myalgias.  Skin: Negative.   Neurological: Negative.   Psychiatric/Behavioral: Negative.      Objective:  Physical Exam Constitutional:      Appearance: She is well-developed.  HENT:     Head: Normocephalic and atraumatic.  Cardiovascular:     Rate and Rhythm: Normal rate and regular rhythm.  Pulmonary:     Effort: Pulmonary effort is normal. No respiratory distress.     Breath sounds: Normal breath sounds. No wheezing or rales.  Abdominal:     General: Bowel sounds are normal. There is no distension.     Palpations: Abdomen is soft.     Tenderness: There is no abdominal tenderness. There is no rebound.  Musculoskeletal:        General: Tenderness present.     Cervical back: Normal range of motion.  Skin:    General: Skin is warm and dry.  Neurological:     Mental Status: She is alert and oriented to person, place, and time.     Coordination: Coordination normal.     Vitals:   12/05/22 1319  BP: 132/62  Pulse: 71  Temp: 98.4 F (36.9 C)  TempSrc: Oral  SpO2: 94%  Weight: 111 lb (50.3 kg)  Height: 5\' 2"  (1.575 m)    Assessment & Plan:

## 2022-12-05 NOTE — Patient Instructions (Signed)
We will get you in with a heart doctor to get checked out.  We have sent in flexeril to use for the pain in the hands.

## 2022-12-06 ENCOUNTER — Encounter: Payer: Self-pay | Admitting: Internal Medicine

## 2022-12-06 DIAGNOSIS — R0789 Other chest pain: Secondary | ICD-10-CM | POA: Insufficient documentation

## 2022-12-06 DIAGNOSIS — I515 Myocardial degeneration: Secondary | ICD-10-CM | POA: Insufficient documentation

## 2022-12-06 NOTE — Assessment & Plan Note (Signed)
With long hx DM type 1 and severe calcifications on CT lung cancer screening she is very high risk for event. She is now having intermittent chest pains. Referral to cardiology done. She is taking aspirin 81 mg daily and lovastatin 40 mg daily.

## 2022-12-06 NOTE — Assessment & Plan Note (Signed)
Having more pain in her hands. Rx flexeril 5 mg BID prn to use for pain and can use tylenol otc.

## 2022-12-06 NOTE — Assessment & Plan Note (Signed)
Rx zyrtec 10 mg daily as she is not able to afford otc medication without prescription.

## 2022-12-06 NOTE — Assessment & Plan Note (Signed)
With severe calcifications of the heart vessels on CT scan she needs assessment by cardiology. She may need stress testing or cath depending. She is very high risk including DM type 1 longstanding, tobacco abuse, hyperlipidemia. Referral to cardiology done.

## 2022-12-06 NOTE — Assessment & Plan Note (Signed)
Asked to stop smoking and she is unable to make quit attempt at this time.

## 2022-12-15 ENCOUNTER — Ambulatory Visit: Payer: Medicare Other | Attending: Internal Medicine | Admitting: Internal Medicine

## 2022-12-15 VITALS — BP 132/72 | HR 74 | Ht 63.0 in | Wt 114.4 lb

## 2022-12-15 DIAGNOSIS — Z794 Long term (current) use of insulin: Secondary | ICD-10-CM

## 2022-12-15 DIAGNOSIS — E782 Mixed hyperlipidemia: Secondary | ICD-10-CM | POA: Diagnosis not present

## 2022-12-15 DIAGNOSIS — E1069 Type 1 diabetes mellitus with other specified complication: Secondary | ICD-10-CM

## 2022-12-15 DIAGNOSIS — R0789 Other chest pain: Secondary | ICD-10-CM

## 2022-12-15 MED ORDER — DAPAGLIFLOZIN PROPANEDIOL 10 MG PO TABS: 10.0000 mg | ORAL_TABLET | Freq: Every day | ORAL | 11 refills | Status: AC

## 2022-12-15 MED ORDER — LOVASTATIN 40 MG PO TABS: 60.0000 mg | ORAL_TABLET | Freq: Every day | ORAL | 3 refills | Status: DC

## 2022-12-15 MED ORDER — METOPROLOL SUCCINATE ER 25 MG PO TB24: 25.0000 mg | ORAL_TABLET | Freq: Every day | ORAL | 3 refills | Status: DC

## 2022-12-15 NOTE — Patient Instructions (Signed)
Medication Instructions:  Increase Lovastatin to 60 mg every evening Start Metoprolol Succinate (Toprol XL) 25 mg a day Start Farxiga 10 mg a day *If you need a refill on your cardiac medications before your next appointment, please call your pharmacy*   Lab Work: Fasting Lipid Panel. Please return for Blood Work in 3 months. No appointment needed, lab here at the office is open Monday-Friday from 8AM to 4PM.   If you have labs (blood work) drawn today and your tests are completely normal, you will receive your results only by: MyChart Message (if you have MyChart) OR A paper copy in the mail If you have any lab test that is abnormal or we need to change your treatment, we will call you to review the results.   Testing/Procedures: Your physician has requested that you have an echocardiogram. Echocardiography is a painless test that uses sound waves to create images of your heart. It provides your doctor with information about the size and shape of your heart and how well your heart's chambers and valves are working. This procedure takes approximately one hour. There are no restrictions for this procedure. Please do NOT wear cologne, perfume, aftershave, or lotions (deodorant is allowed). Please arrive 15 minutes prior to your appointment time.    Follow-Up: At [facility], you and your health needs are our priority.  As part of our continuing mission to provide you with exceptional heart care, we have created designated Provider Care Teams.  These Care Teams include your primary Cardiologist (physician) and Advanced Practice Providers (APPs -  Physician Assistants and Nurse Practitioners) who all work together to provide you with the care you need, when you need it.  We recommend signing up for the patient portal called "MyChart".  Sign up information is provided on this After Visit Summary.  MyChart is used to connect with patients for Virtual Visits (Telemedicine).  Patients are  able to view lab/test results, encounter notes, upcoming appointments, etc.  Non-urgent messages can be sent to your provider as well.   To learn more about what you can do with MyChart, go to [email or web address].    Your next appointment:   6 month(s)  Provider:   Dr Wyline Mood

## 2022-12-15 NOTE — Progress Notes (Signed)
Cardiology Office Note:    Date:  12/15/2022   ID:  Leslie Lane, DOB [birth year], MRN [id number]  PCP:  Myrlene Broker, MD   [facility] Providers Cardiologist:  None     Referring MD: Myrlene Broker, *   No chief complaint on file. Elevated CAC, CP  History of Present Illness:    Leslie Lane is a 67 y.o. female with a hx of GERD,  76 pack hx of smoking, referral for CP and notable severe CAC with LM dx. She notes some L chest pain over a month ago. She walks and she enjoys this. She denies chest pressure with activity. No SOB. She is still smoking. She is under a lot of stress.  No prior cardiac work up. No syncope. No CHF symptoms  Past Medical History:  Diagnosis Date   Abdominal pain 02/28/2016   Abnormal CT scan, bladder    Noted 07/28/13 CT - s/p uro eval 08/2013 [place] -    Acquired trigger finger of left ring finger 02/18/2019   Injected February 18, 2019 and March 2022   Allergic rhinitis    Anemia    Arthritis    Bipolar disorder 07/05/2010   Cataract    Chronic lower back pain 07/05/2010   Chronic neck pain    Diabetic gastroparesis associated with type 1 diabetes mellitus 10/24/2016   Fibromyalgia    Generalized anxiety disorder 04/27/2014   GERD (gastroesophageal reflux disease) 07/05/2010   Hepatitis B virus infection 07/05/2010   History of substance abuse    opiates; cocaine   Hypoglycemia unawareness associated with type 1 diabetes mellitus 11/23/2020   Insomnia 04/13/2015   Major depressive disorder 07/28/2014   Migraine headache 07/05/2010   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  It hasn't happened in a long time."   Smokers' cough 07/05/2010    Tubular adenoma of colon 2018   Type 1 diabetes mellitus with complication 02/06/2013   WBC decreased 03/27/2017    Past Surgical History:  Procedure Laterality Date   CESAREAN SECTION     COLONOSCOPY     TUBAL LIGATION     UPPER GASTROINTESTINAL ENDOSCOPY      Current Medications: Current Outpatient Medications on File Prior to Visit  Medication Sig Dispense Refill   Accu-Chek Softclix Lancets lancets Use as instructed to check blood sugar 4 times per day dx code E10.65 400 each 1   Alcohol Swabs (B-D SINGLE USE SWABS REGULAR) PADS USE FOUR TIMES DAILY 400 each 2   aspirin EC 81 MG tablet Take 81 mg by mouth every morning.     B-D ULTRAFINE III SHORT PEN 31G X 8 MM MISC USE TO INJECT WITH PRE-FILLED PEN SYRINGE SUB-Q DAILY 400 each 2   Blood Glucose Calibration (ACCU-CHEK AVIVA) SOLN USE AS DIRECTED 1 each 1   Blood Glucose Monitoring Suppl (ACCU-CHEK AVIVA PLUS) w/Device KIT USE AS DIRECTED 1 kit 0   cetirizine (ZYRTEC) 10 MG tablet Take 1 tablet (10 mg total) by mouth daily. 90 tablet 3   Clobetasol Propionate 0.05 % shampoo Apply topically.     Continuous Blood Gluc Receiver (FREESTYLE LIBRE 3 READER) DEVI 1 Device by Does not apply route continuous.  1 each 0   Continuous Blood Gluc Sensor (FREESTYLE LIBRE 2 SENSOR) MISC Use to check blood sugar daily 2 each 12   Continuous Blood Gluc Sensor (FREESTYLE LIBRE 3 SENSOR) MISC Place 1 sensor on the skin every 14 days. Use to check glucose continuously 6 each 3   cyclobenzaprine (FLEXERIL) 5 MG tablet Take 1 tablet (5 mg total) by mouth 3 (three) times daily as needed for muscle spasms. 30 tablet 1   donepezil (ARICEPT) 10 MG tablet TAKE 1 TABLET BY MOUTH AT NIGHT 30 tablet 4   escitalopram (LEXAPRO) 5 MG tablet Take 1 tablet (5 mg total) by mouth daily. 50 tablet 0   fexofenadine (ALLEGRA) 180 MG tablet Take 180 mg by mouth daily.     gabapentin (NEURONTIN) 300 MG capsule Take 1 capsule (300 mg total) by mouth 3 (three) times daily. 90  capsule 5   glucose blood (ACCU-CHEK AVIVA PLUS) test strip CHECK BLOOD SUGAR 4 TIMES DAILY 400 strip 3   GVOKE HYPOPEN 2-PACK 1 MG/0.2ML SOAJ INJECT 1 MG UNDER THE SKIN AS NEEDED FOR LOW BLOOD SUGAR. 0.4 mL 2   hyoscyamine (LEVBID) 0.375 MG 12 hr tablet Take 1 tablet (0.375 mg total) by mouth 2 (two) times daily. 60 tablet 11   insulin detemir (LEVEMIR FLEXTOUCH) 100 UNIT/ML FlexPen Inject 3 units in the morning and 6 units at bedtime (Patient taking differently: Inject 3 units in the morning and 9 units at bedtime) 15 mL 5   insulin lispro (HUMALOG KWIKPEN) 100 UNIT/ML KwikPen INJECT SUBCUTANEOUSLY 1 TO  3 UNITS 3 TIMES DAILY FOR  LARGE MEALS 15 mL 3   Insulin Pen Needle 32G X 4 MM MISC Use inject insulin tid 300 each 2   Insulin Syringe-Needle U-100 (B-D INSULIN SYRINGE) 31G X 5/16" 0.3 ML MISC Use to administer insulin 10 each 2   lamoTRIgine (LAMICTAL) 100 MG tablet Take 1 tablet (100 mg total) by mouth 2 (two) times daily. 180 tablet 0   lipase/protease/amylase (CREON) 36000 UNITS CPEP capsule Take 2 capsules by mouth three times a day with meals and 1 capsule by mouth twice daily with snacks 240 capsule 11   lovastatin (MEVACOR) 40 MG tablet TAKE 1 TABLET BY MOUTH AT  BEDTIME 90 tablet 2   Magnesium 500 MG TABS Take 1 tablet by mouth daily.     Multiple Vitamin (MULTIVITAMIN) tablet Take 1 tablet by mouth daily. Reported on 11/22/2015     Omega-3 Fatty Acids (FISH OIL) 1000 MG CAPS Take 1 capsule by mouth daily.     omeprazole (PRILOSEC) 40 MG capsule TAKE 1 CAPSULE(40 MG) BY MOUTH IN THE MORNING AND AT BEDTIME 180 capsule 0   traZODone (DESYREL) 100 MG tablet Take 2 tablets (200 mg total) by mouth at bedtime. 180 tablet 0   triamcinolone cream (KENALOG) 0.1 %      umeclidinium-vilanterol (ANORO ELLIPTA) 62.5-25 MCG/ACT AEPB INHALE 1 PUFF INTO THE LUNGS DAILY 60 each 5   vitamin E 1000 UNIT capsule Take 1,000 Units by mouth daily.     doxycycline (VIBRAMYCIN) 100 MG capsule Take 100 mg by  mouth daily. (Patient not taking: Reported on 12/15/2022)     No current facility-administered medications on file prior to visit.     Allergies:   Penicillins and Sulfonamide derivatives   Social History   Socioeconomic History   Marital status: Divorced    Spouse name: Not on file   Number of children: 1   Years of education: 19  Highest education level: High school graduate  Occupational History   Occupation: Disability  Tobacco Use   Smoking status: Every Day    Packs/day: 1.50    Years: 50.00    Additional pack years: 0.00    Total pack years: 75.00    Types: Cigarettes   Smokeless tobacco: Never   Tobacco comments:    Reports she has cut back to a cartoon that now lasts 2 weeks.   Vaping Use   Vaping Use: Former  Substance and Sexual Activity   Alcohol use: Yes    Alcohol/week: 7.0 standard drinks of alcohol    Types: 7 Cans of beer per week    Comment: 1 beer nightly   Drug use: Not Currently    Types: Cocaine    Comment: past opiate and cocaine abuse   Sexual activity: Not Currently    Birth control/protection: Surgical, Post-menopausal  Other Topics Concern   Not on file  Social History Narrative   Divorced, disabled   Lives with parents   Right handed    Retired   Caffeine prn   Social Determinants of Health   Financial Resource Strain: Low Risk  (03/14/2022)   Overall Financial Resource Strain (CARDIA)    Difficulty of Paying Living Expenses: Not hard at all  Food Insecurity: No Food Insecurity (03/14/2022)   Hunger Vital Sign    Worried About Running Out of Food in the Last Year: Never true    Ran Out of Food in the Last Year: Never true  Transportation Needs: No Transportation Needs (03/14/2022)   PRAPARE - Administrator, Civil Service (Medical): No    Lack of Transportation (Non-Medical): No  Physical Activity: Sufficiently Active (03/14/2022)   Exercise Vital Sign    Days of Exercise per Week: 5 days    Minutes of Exercise per  Session: 30 min  Stress: Stress Concern Present (03/14/2022)   [employer] of Occupational Health - Occupational Stress Questionnaire    Feeling of Stress : To some extent  Social Connections: Socially Integrated (03/14/2022)   Social Connection and Isolation Panel [NHANES]    Frequency of Communication with Friends and Family: More than three times a week    Frequency of Social Gatherings with Friends and Family: More than three times a week    Attends Religious Services: More than 4 times per year    Active Member of [employer] or Organizations: Yes    Attends Engineer, structural: More than 4 times per year    Marital Status: Married     Family History: The patient's family history includes Arthritis in her father and mother; Bladder Cancer in her sister; Celiac disease in her sister; Colon cancer (age of onset: 73) in her sister; Diabetes in her niece; Diverticulosis in her mother; Heart attack in her brother; Hyperlipidemia in her mother and another family member; Kidney cancer in her father and paternal aunt; Kidney disease in her father and mother. There is no history of Anxiety disorder, Bipolar disorder, Depression, Breast cancer, Esophageal cancer, Stomach cancer, or Rectal cancer.  ROS:   Please see the history of present illness.     All other systems reviewed and are negative.  EKGs/Labs/Other Studies Reviewed:    The following studies were reviewed today:   EKG:  EKG is  ordered today.  The ekg ordered today demonstrates   12/15/2022: NSR  Recent Labs: 11/10/2022: ALT 17; BUN 20; Creatinine, Ser 0.83; Potassium 4.5; Sodium 139  Recent  Lipid Panel    Component Value Date/Time   CHOL 170 11/10/2022 1405   TRIG 67.0 11/10/2022 1405   HDL 83.70 11/10/2022 1405   CHOLHDL 2 11/10/2022 1405   VLDL 13.4 11/10/2022 1405   LDLCALC 73 11/10/2022 1405   LDLDIRECT 101.7 03/26/2012 1048     Risk Assessment/Calculations:     Physical Exam:    VS:   Vitals:    12/15/22 1345  BP: 132/72  Pulse: 74  SpO2: 96%    Wt Readings from Last 3 Encounters:  12/05/22 111 lb (50.3 kg)  11/13/22 109 lb 9.6 oz (49.7 kg)  08/23/22 112 lb (50.8 kg)     GEN:  Well nourished, well developed in no acute distress HEENT: Normal NECK: No JVD; No carotid bruits LYMPHATICS: No lymphadenopathy CARDIAC: RRR, II/VI, rubs, gallops RESPIRATORY:  Clear to auscultation without rales, wheezing or rhonchi  ABDOMEN: Soft, non-tender, non-distended MUSCULOSKELETAL:  No edema; No deformity  SKIN: Warm and dry NEUROLOGIC:  Alert and oriented x 3 PSYCHIATRIC:  Normal affect   ASSESSMENT:    Elevated CAC DM2: A1c goal < 7 PLAN:    In order of problems listed above:  TTE Start farxiga 10 mg daily Start metoprolol XL 25 mg daily increase lovastatin 60 mg daily, LDL goal < 70 mg/dL Continue asa 81 mg daily Fasting lipids 3 months Discussed smoking cessation extensively  Follow up 6 months      Medication Adjustments/Labs and Tests Ordered: Current medicines are reviewed at length with the patient today.  Concerns regarding medicines are outlined above.  No orders of the defined types were placed in this encounter.  No orders of the defined types were placed in this encounter.   There are no Patient Instructions on file for this visit.   Signed, Maisie Fus, MD  12/15/2022 1:45 PM    [facility]

## 2022-12-24 ENCOUNTER — Encounter (HOSPITAL_COMMUNITY): Payer: Self-pay

## 2022-12-24 ENCOUNTER — Other Ambulatory Visit: Payer: Self-pay

## 2022-12-24 ENCOUNTER — Emergency Department (HOSPITAL_COMMUNITY)
Admission: EM | Admit: 2022-12-24 | Discharge: 2022-12-24 | Disposition: A | Discharge status: Home / Self Care | Payer: Medicare Other | Attending: Emergency Medicine | Admitting: Emergency Medicine

## 2022-12-24 DIAGNOSIS — T383X5A Adverse effect of insulin and oral hypoglycemic [antidiabetic] drugs, initial encounter: Secondary | ICD-10-CM | POA: Diagnosis not present

## 2022-12-24 DIAGNOSIS — Z7982 Long term (current) use of aspirin: Secondary | ICD-10-CM | POA: Diagnosis not present

## 2022-12-24 DIAGNOSIS — Z794 Long term (current) use of insulin: Secondary | ICD-10-CM | POA: Diagnosis not present

## 2022-12-24 DIAGNOSIS — E1065 Type 1 diabetes mellitus with hyperglycemia: Secondary | ICD-10-CM | POA: Diagnosis not present

## 2022-12-24 DIAGNOSIS — Z760 Encounter for issue of repeat prescription: Secondary | ICD-10-CM | POA: Diagnosis not present

## 2022-12-24 DIAGNOSIS — E1165 Type 2 diabetes mellitus with hyperglycemia: Secondary | ICD-10-CM | POA: Diagnosis not present

## 2022-12-24 DIAGNOSIS — D72819 Decreased white blood cell count, unspecified: Secondary | ICD-10-CM | POA: Insufficient documentation

## 2022-12-24 DIAGNOSIS — R739 Hyperglycemia, unspecified: Secondary | ICD-10-CM

## 2022-12-24 LAB — CBC WITH DIFFERENTIAL/PLATELET
Abs Immature Granulocytes: 0.01 10*3/uL (ref 0.00–0.07)
Basophils Absolute: 0.1 10*3/uL (ref 0.0–0.1)
Basophils Relative: 1 %
Eosinophils Absolute: 0.1 10*3/uL (ref 0.0–0.5)
Eosinophils Relative: 4 %
HCT: 33.3 % — ABNORMAL LOW (ref 36.0–46.0)
Hemoglobin: 10.2 g/dL — ABNORMAL LOW (ref 12.0–15.0)
Immature Granulocytes: 0 %
Lymphocytes Relative: 50 %
Lymphs Abs: 1.8 10*3/uL (ref 0.7–4.0)
MCH: 23.8 pg — ABNORMAL LOW (ref 26.0–34.0)
MCHC: 30.6 g/dL (ref 30.0–36.0)
MCV: 77.8 fL — ABNORMAL LOW (ref 80.0–100.0)
Monocytes Absolute: 0.3 10*3/uL (ref 0.1–1.0)
Monocytes Relative: 8 %
Neutro Abs: 1.4 10*3/uL — ABNORMAL LOW (ref 1.7–7.7)
Neutrophils Relative %: 37 %
Platelets: 303 10*3/uL (ref 150–400)
RBC: 4.28 MIL/uL (ref 3.87–5.11)
RDW: 19.4 % — ABNORMAL HIGH (ref 11.5–15.5)
WBC: 3.7 10*3/uL — ABNORMAL LOW (ref 4.0–10.5)
nRBC: 0 % (ref 0.0–0.2)

## 2022-12-24 LAB — BASIC METABOLIC PANEL
Anion gap: 8 (ref 5–15)
BUN: 32 mg/dL — ABNORMAL HIGH (ref 8–23)
CO2: 25 mmol/L (ref 22–32)
Calcium: 8.8 mg/dL — ABNORMAL LOW (ref 8.9–10.3)
Chloride: 99 mmol/L (ref 98–111)
Creatinine, Ser: 0.74 mg/dL (ref 0.44–1.00)
GFR, Estimated: >60 mL/min (ref >60)
Glucose, Bld: 194 mg/dL — ABNORMAL HIGH (ref 70–99)
Potassium: 4 mmol/L (ref 3.5–5.1)
Sodium: 132 mmol/L — ABNORMAL LOW (ref 135–145)

## 2022-12-24 LAB — URINALYSIS, W/ REFLEX TO CULTURE (INFECTION SUSPECTED)
Bacteria, UA: NONE SEEN
Bilirubin Urine: NEGATIVE
Glucose, UA: NEGATIVE mg/dL
Hgb urine dipstick: NEGATIVE
Ketones, ur: NEGATIVE mg/dL
Leukocytes,Ua: NEGATIVE
Nitrite: NEGATIVE
Protein, ur: NEGATIVE mg/dL
Specific Gravity, Urine: 1.011 (ref 1.005–1.030)
pH: 7 (ref 5.0–8.0)

## 2022-12-24 LAB — CBG MONITORING, ED: Glucose-Capillary: 200 mg/dL — ABNORMAL HIGH (ref 70–99)

## 2022-12-24 MED ORDER — LEVEMIR FLEXTOUCH 100 UNIT/ML ~~LOC~~ SOPN
PEN_INJECTOR | SUBCUTANEOUS | 5 refills | Status: DC
Start: 2022-12-24 — End: 2023-05-03

## 2022-12-24 MED ORDER — SODIUM CHLORIDE 0.9 % IV BOLUS
1000.0000 mL | Freq: Once | INTRAVENOUS | Status: AC
  Administered 2022-12-24: 1000 mL via INTRAVENOUS

## 2022-12-24 NOTE — ED Triage Notes (Signed)
Pt came POV c/o out of out of Levemer insulin. Pt states she took her Humalog and exercise but sugar was 390 this am and 222 once arrived to ED. Pt tried to reach out to her MD, Dr. Lucianne Muss to fill her medication but she was instructed to come to ED.

## 2022-12-24 NOTE — Discharge Instructions (Addendum)
It was a pleasure taking care of you in the emergency department  I have refilled your Levemir take as you are previously prescribed  Follow-up with your endocrinologist  Return for new or worsening symptoms

## 2022-12-24 NOTE — ED Provider Notes (Signed)
[place] EMERGENCY DEPARTMENT AT [facility] Provider Note   CSN: [id number] Arrival date & time: 12/24/22  1115    History  Chief Complaint  Patient presents with   Hyperglycemia    Leslie Lane is a 67 y.o. female type I diabetic here for evaluation of hyperglycemia medication refill.  She ran out of her Levemir pen yesterday evening and was only able to take a partial dose of 1 unit.  She intermittently takes Humalog however due to frequent hypoglycemic episodes she does not typically dose for food intake and only uses a long-acting insulin.  States she woke up this morning and her blood sugar was 390.  She corrected with Humalog however she was concerned as she was out of her long-acting insulin she consulted Dr. Lucianne Muss who is her endocrinologist.  She states she was told by the on-call nurse that she needed to be seen in the emergency department and that they could not refill her medication over the weekend.  Patient states she does have some polyuria.  She denies any dysuria, hematuria, abdominal pain.  She has been otherwise normal state of health.  Eating and drinking normally, no emesis, chest pain, shortness of breath.   HPI     Home Medications Prior to Admission medications   Medication Sig Start Date End Date Taking? Authorizing Provider  Accu-Chek Softclix Lancets lancets Use as instructed to check blood sugar 4 times per day dx code E10.65 01/28/20   Reather Littler, MD  Alcohol Swabs (B-D SINGLE USE SWABS REGULAR) PADS USE FOUR TIMES DAILY 01/28/20   Reather Littler, MD  aspirin EC 81 MG tablet Take 81 mg by mouth every morning.    [provider]  B-D ULTRAFINE III SHORT PEN 31G X 8 MM MISC USE TO INJECT WITH PRE-FILLED PEN SYRINGE SUB-Q DAILY 09/25/22   Reather Littler, MD  Blood Glucose Calibration (ACCU-CHEK AVIVA) SOLN USE AS DIRECTED 01/28/20   Reather Littler, MD  Blood Glucose Monitoring Suppl (ACCU-CHEK AVIVA PLUS) w/Device KIT USE AS DIRECTED 12/19/16   Reather Littler, MD  cetirizine (ZYRTEC) 10 MG tablet Take 1 tablet (10 mg total) by mouth daily. 12/05/22   Myrlene Broker, MD  Clobetasol Propionate 0.05 % shampoo Apply topically. 11/16/21   [provider]  Continuous Blood Gluc Receiver (FREESTYLE LIBRE 3 READER) DEVI 1 Device by Does not apply route continuous. 08/14/22   Reather Littler, MD  Continuous Blood Gluc Sensor (FREESTYLE LIBRE 2 SENSOR) MISC Use to check blood sugar daily 09/08/21   Reather Littler, MD  Continuous Blood Gluc Sensor (FREESTYLE LIBRE 3 SENSOR) MISC Place 1 sensor on the skin every 14 days. Use to check glucose continuously 08/14/22   Reather Littler, MD  cyclobenzaprine (FLEXERIL) 5 MG tablet Take 1 tablet (5 mg total) by mouth 3 (three) times daily as needed for muscle spasms. 12/05/22   Myrlene Broker, MD  dapagliflozin propanediol (FARXIGA) 10 MG TABS tablet Take 1 tablet (10 mg total) by mouth daily. 12/15/22   Maisie Fus, MD  donepezil (ARICEPT) 10 MG tablet TAKE 1 TABLET BY MOUTH AT NIGHT 09/04/22   Gwynneth Munson, Sung Amabile, PA-C  escitalopram (LEXAPRO) 5 MG tablet Take 1 tablet (5 mg total) by mouth daily. 08/17/22   Oletta Darter, MD  fexofenadine (ALLEGRA) 180 MG tablet Take 180 mg by mouth daily.    [provider]  gabapentin (NEURONTIN) 300 MG capsule Take 1 capsule (300 mg total) by mouth 3 (three) times  daily. 07/04/22   Myrlene Broker, MD  glucose blood (ACCU-CHEK AVIVA PLUS) test strip CHECK BLOOD SUGAR 4 TIMES DAILY 09/01/22   Reather Littler, MD  GVOKE HYPOPEN 2-PACK 1 MG/0.2ML SOAJ INJECT 1 MG UNDER THE SKIN AS NEEDED FOR LOW BLOOD SUGAR. 09/19/22   Reather Littler, MD  hyoscyamine (LEVBID) 0.375 MG 12 hr tablet Take 1 tablet (0.375 mg total) by mouth 2 (two) times daily. 10/19/21   Meryl Dare, MD  insulin detemir (LEVEMIR FLEXTOUCH) 100 UNIT/ML FlexPen Inject 3 units in the morning and 6 units at bedtime 12/24/22   Yovani Cogburn A, PA-C  insulin lispro (HUMALOG KWIKPEN) 100 UNIT/ML KwikPen INJECT  SUBCUTANEOUSLY 1 TO  3 UNITS 3 TIMES DAILY FOR  LARGE MEALS 02/08/22   Reather Littler, MD  Insulin Pen Needle 32G X 4 MM MISC Use inject insulin tid 03/03/21   Reather Littler, MD  Insulin Syringe-Needle U-100 (B-D INSULIN SYRINGE) 31G X 5/16" 0.3 ML MISC Use to administer insulin 11/03/22   Reather Littler, MD  lamoTRIgine (LAMICTAL) 100 MG tablet Take 1 tablet (100 mg total) by mouth 2 (two) times daily. 10/05/22   Oletta Darter, MD  lipase/protease/amylase (CREON) [phone] UNITS CPEP capsule Take 2 capsules by mouth three times a day with meals and 1 capsule by mouth twice daily with snacks 10/19/21   Meryl Dare, MD  lovastatin (MEVACOR) 40 MG tablet Take 1.5 tablets (60 mg total) by mouth at bedtime. 12/15/22   Maisie Fus, MD  Magnesium 500 MG TABS Take 1 tablet by mouth daily.    [provider]  metoprolol succinate (TOPROL XL) 25 MG 24 hr tablet Take 1 tablet (25 mg total) by mouth daily. 12/15/22   Maisie Fus, MD  Multiple Vitamin (MULTIVITAMIN) tablet Take 1 tablet by mouth daily. Reported on 11/22/2015    [provider]  Omega-3 Fatty Acids (FISH OIL) 1000 MG CAPS Take 1 capsule by mouth daily.    [provider]  omeprazole (PRILOSEC) 40 MG capsule TAKE 1 CAPSULE(40 MG) BY MOUTH IN THE MORNING AND AT BEDTIME 04/17/22   Myrlene Broker, MD  traZODone (DESYREL) 100 MG tablet Take 2 tablets (200 mg total) by mouth at bedtime. 10/05/22   Oletta Darter, MD  triamcinolone cream (KENALOG) 0.1 %  12/04/21   [provider]  umeclidinium-vilanterol [facility] ELLIPTA) 62.5-25 MCG/ACT AEPB INHALE 1 PUFF INTO THE LUNGS DAILY 01/04/22   Hunsucker, Lesia Sago, MD  vitamin E 1000 UNIT capsule Take 1,000 Units by mouth daily.    [provider]      Allergies    Penicillins and Sulfonamide derivatives    Review of Systems   Review of Systems  Constitutional: Negative.   HENT: Negative.    Respiratory: Negative.    Cardiovascular: Negative.   Gastrointestinal:  Negative.   Endocrine: Positive for polyuria.  Genitourinary: Negative.   Musculoskeletal: Negative.   Neurological: Negative.   All other systems reviewed and are negative.   Physical Exam Updated Vital Signs BP (!) 152/69 (BP Location: Right Arm)   Pulse 73   Temp (!) 97.4 F (36.3 C) (Oral)   Resp 18   Ht 5\' 3"  (1.6 m)   Wt 50.8 kg   SpO2 97%   BMI 19.84 kg/m  Physical Exam Vitals and nursing note reviewed.  Constitutional:      General: She is not in acute distress.    Appearance: She is well-developed. She is not ill-appearing or toxic-appearing.  HENT:     Head: Normocephalic and atraumatic.     Nose: Nose normal.     Mouth/Throat:     Mouth: Mucous membranes are moist.  Eyes:     Pupils: Pupils are equal, round, and reactive to light.  Cardiovascular:     Rate and Rhythm: Normal rate.     Pulses: Normal pulses.     Heart sounds: Normal heart sounds.  Pulmonary:     Effort: Pulmonary effort is normal. No respiratory distress.     Breath sounds: Normal breath sounds.  Abdominal:     General: Bowel sounds are normal. There is no distension.     Palpations: Abdomen is soft.  Musculoskeletal:        General: No swelling, tenderness or signs of injury. Normal range of motion.     Cervical back: Normal range of motion.     Right lower leg: No edema.     Left lower leg: No edema.  Skin:    General: Skin is warm and dry.     Capillary Refill: Capillary refill takes less than 2 seconds.  Neurological:     General: No focal deficit present.     Mental Status: She is alert.  Psychiatric:        Mood and Affect: Mood normal.     ED Results / Procedures / Treatments   Labs (all labs ordered are listed, but only abnormal results are displayed) Labs Reviewed  CBC WITH DIFFERENTIAL/PLATELET - Abnormal; Notable for the following components:      Result Value   WBC 3.7 (*)    Hemoglobin 10.2 (*)    HCT 33.3 (*)    MCV 77.8 (*)    MCH 23.8 (*)    RDW 19.4 (*)     Neutro Abs 1.4 (*)    All other components within normal limits  BASIC METABOLIC PANEL - Abnormal; Notable for the following components:   Sodium 132 (*)    Glucose, Bld 194 (*)    BUN 32 (*)    Calcium 8.8 (*)    All other components within normal limits  CBG MONITORING, ED - Abnormal; Notable for the following components:   Glucose-Capillary 200 (*)    All other components within normal limits  URINALYSIS, W/ REFLEX TO CULTURE (INFECTION SUSPECTED)    EKG None  Radiology No results found.  Procedures Procedures    Medications Ordered in ED Medications  sodium chloride 0.9 % bolus 1,000 mL (1,000 mLs Intravenous New Bag/Given 12/24/22 1145)   ED Course/ Medical Decision Making/ A&P    67 year old history of type 1 diabetes, tobacco use, neurocognitive disorder, gastroparesis here for evaluation of hyperglycemia and medication refill.  Apparently ran out of her long-acting insulin yesterday evening and was not able to get result for full dose.  When she woke up this morning she noted to have some polyuria without dysuria, hematuria abdominal pain and checked her blood sugar which stated it was 390.  She called her endocrinologist, Dr. Remus Blake office and was told to come to the emergency department as he did not refill medications over the weekend according to patient.  Patient arrives she is afebrile, nonseptic, not ill-appearing.  She has stable vital signs without tachycardia.  Her heart and lungs are clear.  Abdomen is soft, nontender.  She has normal mentation.  Will plan on checking some basic labs, bolus of IV fluids  Labs personally viewed and interpreted :  UA negative for infection, ketonuria  CBC leukopenia, WBC 3.7, hemoglobin 10.2 BMP sodium 132, glucose 194, Bicarb normal, anion gap 8   Discussed results with patient, family room.  Into DKA, HHS, we will refill her home medicine.  I encouraged her to follow-up outpatient with her endocrinologist.  Patient  agreeable  The patient has been appropriately medically screened and/or stabilized in the ED. I have low suspicion for any other emergent medical condition which would require further screening, evaluation or treatment in the ED or require inpatient management.  Patient is hemodynamically stable and in no acute distress.  Patient able to ambulate in department prior to ED.  Evaluation does not show acute pathology that would require ongoing or additional emergent interventions while in the emergency department or further inpatient treatment.  I have discussed the diagnosis with the patient and answered all questions.  Pain is been managed while in the emergency department and patient has no further complaints prior to discharge.  Patient is comfortable with plan discussed in room and is stable for discharge at this time.  I have discussed strict return precautions for returning to the emergency department.  Patient was encouraged to follow-up with PCP/specialist refer to at discharge.                             Medical Decision Making Amount and/or Complexity of Data Reviewed External Data Reviewed: labs, radiology, ECG and notes. Labs: ordered. Decision-making details documented in ED Course.  Risk OTC drugs. Prescription drug management. Decision regarding hospitalization. Diagnosis or treatment significantly limited by social determinants of health.         Final Clinical Impression(s) / ED Diagnoses Final diagnoses:  Hyperglycemia  Medication refill    Rx / DC Orders ED Discharge Orders          Ordered    insulin detemir (LEVEMIR FLEXTOUCH) 100 UNIT/ML FlexPen       Note to Pharmacy: Stop Tresiba   12/24/22 1255              Rolando Whitby A, PA-C 12/24/22 1306    Margarita Grizzle, MD 12/25/22 [phone]

## 2022-12-27 ENCOUNTER — Telehealth: Payer: Self-pay

## 2022-12-27 NOTE — Telephone Encounter (Signed)
Access nurse telephone call-Caller states her BS is 390. She is sweaty, no energy, sleepy. No rapid resp. symptoms since last  night. Took an extra 3 units last night because meter read high.   Patient is going to have someone drive her to the ER  Provider is made aware

## 2022-12-29 ENCOUNTER — Telehealth: Payer: Self-pay

## 2022-12-29 ENCOUNTER — Other Ambulatory Visit: Payer: Self-pay | Admitting: Physician Assistant

## 2022-12-29 NOTE — Telephone Encounter (Signed)
Transition Care Management Follow-up Telephone Call Date of discharge and from where: [facility] 6/23 How have you been since you were released from the hospital? Alright  Any questions or concerns? No  Items Reviewed: Did the pt receive and understand the discharge instructions provided? Yes  Medications obtained and verified? Yes  Other? No  Any new allergies since your discharge? No  Dietary orders reviewed? No Do you have support at home? No    Follow up appointments reviewed:  PCP Hospital f/u appt confirmed? Yes  Scheduled to see  on  @ . Specialist Hospital f/u appt confirmed?  Scheduled to see  on  @ . Are transportation arrangements needed? No  If their condition worsens, is the pt aware to call PCP or go to the Emergency Dept.? Yes Was the patient provided with contact information for the PCP's office or ED? Yes Was to pt encouraged to call back with questions or concerns? Yes

## 2023-01-01 ENCOUNTER — Other Ambulatory Visit: Payer: Self-pay | Admitting: Internal Medicine

## 2023-01-08 ENCOUNTER — Ambulatory Visit (HOSPITAL_COMMUNITY): Payer: Medicare Other | Attending: Internal Medicine

## 2023-01-08 DIAGNOSIS — R0789 Other chest pain: Secondary | ICD-10-CM | POA: Insufficient documentation

## 2023-01-08 LAB — ECHOCARDIOGRAM COMPLETE
AR max vel: 1.44 cm2
AV Area VTI: 1.66 cm2
AV Area mean vel: 1.51 cm2
AV Mean grad: 6 mmHg
AV Peak grad: 11.7 mmHg
Ao pk vel: 1.71 m/s
Area-P 1/2: 2.01 cm2
P 1/2 time: 314 msec
S' Lateral: 2.9 cm

## 2023-01-16 ENCOUNTER — Other Ambulatory Visit: Payer: Self-pay | Admitting: Internal Medicine

## 2023-01-24 ENCOUNTER — Telehealth (HOSPITAL_COMMUNITY): Payer: Medicare Other | Admitting: Psychiatry

## 2023-01-24 ENCOUNTER — Other Ambulatory Visit: Payer: Medicare Other

## 2023-01-24 DIAGNOSIS — E1065 Type 1 diabetes mellitus with hyperglycemia: Secondary | ICD-10-CM | POA: Diagnosis not present

## 2023-01-24 LAB — HEMOGLOBIN A1C: Hgb A1c MFr Bld: 6.8 % — ABNORMAL HIGH (ref 4.6–6.5)

## 2023-01-24 LAB — BASIC METABOLIC PANEL
BUN: 29 mg/dL — ABNORMAL HIGH (ref 6–23)
CO2: 30 mEq/L (ref 19–32)
Calcium: 9.5 mg/dL (ref 8.4–10.5)
Chloride: 100 mEq/L (ref 96–112)
Creatinine, Ser: 0.84 mg/dL (ref 0.40–1.20)
GFR: 71.23 mL/min (ref >60.00)
Glucose, Bld: 122 mg/dL — ABNORMAL HIGH (ref 70–99)
Potassium: 4.5 mEq/L (ref 3.5–5.1)
Sodium: 140 mEq/L (ref 135–145)

## 2023-01-29 ENCOUNTER — Ambulatory Visit: Payer: Medicare Other | Admitting: Endocrinology

## 2023-01-29 ENCOUNTER — Encounter: Payer: Self-pay | Admitting: Endocrinology

## 2023-01-29 VITALS — BP 150/74 | HR 71 | Ht 63.0 in | Wt 111.4 lb

## 2023-01-29 DIAGNOSIS — E1042 Type 1 diabetes mellitus with diabetic polyneuropathy: Secondary | ICD-10-CM

## 2023-01-29 DIAGNOSIS — E1065 Type 1 diabetes mellitus with hyperglycemia: Secondary | ICD-10-CM | POA: Diagnosis not present

## 2023-01-29 DIAGNOSIS — R030 Elevated blood-pressure reading, without diagnosis of hypertension: Secondary | ICD-10-CM

## 2023-01-29 NOTE — Progress Notes (Signed)
Patient ID: Leslie Lane, female   DOB: July 13, 1955, 67 y.o.   MRN: [id number]  Reason for Appointment: Endocrinology follow-up   History of Present Illness    Diagnosis: Type 1 DIABETES MELITUS, diagnosed 1967      She has had labile blood sugar control over the last several years even though A1c has been usually around 7% She has had less lability and hypoglycemia with taking b.i.d. Lantus compared once a day She has been very sensitive to fast acting insulin and frequently does not require mealtime coverage She cannot tolerate Toujeo because of reported episode of headache, bloating and nausea with the first dose  RECENT history:  Insulin regimen: LEVEMIR 4 units in the morning and 9 units at about 9 pm  HUMALOG 1-3 units before meals and as needed  Current blood sugar patterns, control and problems identified:  Her A1c is 6.8, was 7.1  Blood sugar patterns are discussed in detail and the CGM interpretation With the decreasing her Levemir in the last visit her blood sugars are not as low but still has been reactive hypoglycemia Most of her hypoglycemia is after dinner She is continuing to take coverage at dinnertime even though she has been told not to and generally does not have high readings Premeal also at dinnertime.  Usually eating 1 serving of bread at dinnertime She has occasional episodes of hyperglycemia during the day when she is eating a lot of snacks Also blood sugars appear to be unpredictable overnight At least on 1 occasion her sugar has been high overnight from forgetting her Levemir in the evening She is usually taking 1-3 units of Humalog with her meals as above based on her Premeal blood sugar and what she is eating Most of her physical activity related to walking while shopping   Mealtimes: Breakfast 8-8.30 am, lunch 12 noon , dinner 5 pm  Hypoglycemia: Symptoms may be absent with low blood sugars and does not have early warning symptoms. Gets  confused and she frequently depends on her mother to recognize low sugars and treat them.  Her mother knows how to give Glucagon injection  She will treat her low blood sugars usually with juice, does carry glucose tablets when not at home  Blood sugar patterns as follows from [place] to download for the last 2 weeks  The time in range has improved since her last visit mostly because of less hypoglycemia Blood sugars are still showing significant variability with GV 40  In the last 2 weeks overnight blood sugars are inconsistent but overall higher than target of under 140: She has inconsistent hyperglycemic episodes but also 3 episodes of hypoglycemia at different times Pre-meal blood sugars are generally relatively lower at dinnertime with averages below HYPERGLYCEMIC episodes usually seen except sporadically after lunch Occasionally her hyperglycemic episodes are preceded by hypoglycemia Hypoglycemia during the day is mostly present in the evening between 6 PM-11 PM and fairly often   CGM use % of time 96  2-week average/GV 145/40  Time in range        % 66  % Time Above 180 20  % Time above 250 5  % Time Below 70      PRE-MEAL Fasting Lunch Dinner Bedtime Overall  Glucose range:       Averages: 151 134 135 115    POST-MEAL PC Breakfast PC Lunch PC Dinner  Glucose range:     Averages: 153 161 109  Previously:   CGM use %  of time   2-week average/GV 145/49  Time in range  57      %  % Time Above 180 21+9  % Time above 250   % Time Below 70 13     PRE-MEAL Fasting Lunch Dinner Bedtime Overall  Glucose range:       Averages: 130   160s 7    POST-MEAL PC Breakfast PC Lunch PC Dinner  Glucose range:     Averages: 175 144 139    Wt Readings from Last 3 Encounters:  01/29/23 111 lb 6.4 oz (50.5 kg)  12/24/22 112 lb (50.8 kg)  12/15/22 114 lb 6.4 oz (51.9 kg)    Lab Results  Component Value Date   HGBA1C 6.8 (H) 01/24/2023   HGBA1C 7.1 (H) 11/10/2022   HGBA1C 7.1  (H) 08/07/2022   Lab Results  Component Value Date   MICROALBUR <0.7 07/04/2022   LDLCALC 73 11/10/2022   CREATININE 0.84 01/24/2023        Allergies as of 01/29/2023       Reactions   Penicillins Anaphylaxis   Sulfonamide Derivatives Anaphylaxis        Medication List        Accurate as of January 29, 2023  8:42 PM. If you have any questions, ask your nurse or doctor.          Accu-Chek Aviva Plus test strip Generic drug: glucose blood CHECK BLOOD SUGAR 4 TIMES DAILY   Accu-Chek Aviva Plus w/Device Kit USE AS DIRECTED   Accu-Chek Aviva Soln USE AS DIRECTED   Accu-Chek Softclix Lancets lancets Use as instructed to check blood sugar 4 times per day dx code E10.65   Anoro Ellipta 62.5-25 MCG/ACT Aepb Generic drug: umeclidinium-vilanterol INHALE 1 PUFF INTO THE LUNGS DAILY   aspirin EC 81 MG tablet Take 81 mg by mouth every morning.   B-D SINGLE USE SWABS REGULAR Pads USE FOUR TIMES DAILY   cetirizine 10 MG tablet Commonly known as: ZYRTEC Take 1 tablet (10 mg total) by mouth daily.   Clobetasol Propionate 0.05 % shampoo Apply topically.   cyclobenzaprine 5 MG tablet Commonly known as: FLEXERIL Take 1 tablet (5 mg total) by mouth 3 (three) times daily as needed for muscle spasms.   dapagliflozin propanediol 10 MG Tabs tablet Commonly known as: Farxiga Take 1 tablet (10 mg total) by mouth daily.   donepezil 10 MG tablet Commonly known as: ARICEPT TAKE 1 TABLET BY MOUTH AT NIGHT   escitalopram 5 MG tablet Commonly known as: LEXAPRO Take 1 tablet (5 mg total) by mouth daily.   fexofenadine 180 MG tablet Commonly known as: ALLEGRA Take 180 mg by mouth daily.   Fish Oil 1000 MG Caps Take 1 capsule by mouth daily.   FreeStyle Libre 2 Sensor Misc Use to check blood sugar daily   FreeStyle Libre 3 Sensor Misc Place 1 sensor on the skin every 14 days. Use to check glucose continuously   FreeStyle [place] [facility] 1 Device by Does not  apply route continuous.   gabapentin 300 MG capsule Commonly known as: NEURONTIN TAKE 1 CAPSULE(300 MG) BY MOUTH THREE TIMES DAILY   Gvoke HypoPen 2-Pack 1 MG/0.2ML Soaj Generic drug: Glucagon INJECT 1 MG UNDER THE SKIN AS NEEDED FOR LOW BLOOD SUGAR.   hyoscyamine 0.375 MG 12 hr tablet Commonly known as: Levbid Take 1 tablet (0.375 mg total) by mouth 2 (two) times daily.   insulin lispro 100 UNIT/ML KwikPen Commonly known as: HumaLOG  KwikPen INJECT SUBCUTANEOUSLY 1 TO  3 UNITS 3 TIMES DAILY FOR  LARGE MEALS   Insulin Pen Needle 32G X 4 MM Misc Use inject insulin tid   B-D ULTRAFINE III SHORT PEN 31G X 8 MM Misc Generic drug: Insulin Pen Needle USE TO INJECT WITH PRE-FILLED PEN SYRINGE SUB-Q DAILY   Insulin Syringe-Needle U-100 31G X 5/16" 0.3 ML Misc Commonly known as: B-D INSULIN SYRINGE Use to administer insulin   lamoTRIgine 100 MG tablet Commonly known as: LAMICTAL Take 1 tablet (100 mg total) by mouth 2 (two) times daily.   Levemir FlexTouch 100 UNIT/ML FlexTouch Pen Generic drug: insulin detemir Inject 3 units in the morning and 6 units at bedtime   lipase/protease/amylase 53664 UNITS Cpep capsule Commonly known as: Creon Take 2 capsules by mouth three times a day with meals and 1 capsule by mouth twice daily with snacks   lovastatin 40 MG tablet Commonly known as: MEVACOR Take 1.5 tablets (60 mg total) by mouth at bedtime.   Magnesium 500 MG Tabs Take 1 tablet by mouth daily.   metoprolol succinate 25 MG 24 hr tablet Commonly known as: Toprol XL Take 1 tablet (25 mg total) by mouth daily.   multivitamin tablet Take 1 tablet by mouth daily. Reported on 11/22/2015   omeprazole 40 MG capsule Commonly known as: PRILOSEC TAKE 1 CAPSULE(40 MG) BY MOUTH IN THE MORNING AND AT BEDTIME   traZODone 100 MG tablet Commonly known as: DESYREL Take 2 tablets (200 mg total) by mouth at bedtime.   triamcinolone cream 0.1 % Commonly known as: KENALOG   vitamin E  1000 UNIT capsule Take 1,000 Units by mouth daily.        Allergies:  Allergies  Allergen Reactions   Penicillins Anaphylaxis   Sulfonamide Derivatives Anaphylaxis    Past Medical History:  Diagnosis Date   Abdominal pain 02/28/2016   Abnormal CT scan, bladder    Noted 07/28/13 CT - s/p uro eval 08/2013 [place] -    Acquired trigger finger of left ring finger 02/18/2019   Injected February 18, 2019 and March 2022   Allergic rhinitis    Anemia    Arthritis    Bipolar disorder 07/05/2010   Cataract    Chronic lower back pain 07/05/2010   Chronic neck pain    Diabetic gastroparesis associated with type 1 diabetes mellitus 10/24/2016   Fibromyalgia    Generalized anxiety disorder 04/27/2014   GERD (gastroesophageal reflux disease) 07/05/2010   Hepatitis B virus infection 07/05/2010   History of substance abuse    opiates; cocaine   Hypoglycemia unawareness associated with type 1 diabetes mellitus 11/23/2020   Insomnia 04/13/2015   Major depressive disorder 07/28/2014   Migraine headache 07/05/2010   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  It hasn't happened in a long time."   Smokers' cough 07/05/2010   Tubular adenoma of colon 2018   Type 1 diabetes mellitus with complication 02/06/2013   WBC decreased 03/27/2017    Past Surgical History:  Procedure Laterality Date   CESAREAN SECTION     COLONOSCOPY     TUBAL LIGATION     UPPER GASTROINTESTINAL ENDOSCOPY      Family History  Problem Relation  Age of Onset   Arthritis Mother    Diverticulosis Mother    Kidney disease Mother    Hyperlipidemia Mother    Arthritis Father    Kidney disease Father    Kidney cancer Father    Bladder Cancer  Sister    Colon cancer Sister 34   Celiac disease Sister    Heart attack Brother    Kidney cancer Paternal Aunt    Diabetes Niece    Hyperlipidemia Other    Anxiety disorder Neg Hx    Bipolar disorder Neg Hx    Depression Neg Hx    Breast cancer Neg Hx    Esophageal cancer Neg Hx    Stomach cancer Neg Hx    Rectal cancer Neg Hx     Social History:  reports that she has been smoking cigarettes. She has a 75 pack-year smoking history. She has never used smokeless tobacco. She reports current alcohol use of about 7.0 standard drinks of alcohol per week. She reports that she does not currently use drugs after having used the following drugs: Cocaine.  Review of Systems:  She has [facility] exams from [facility]  Had gastroparesis previously treated with Reglan  Also being followed by gastroenterologist for abdominal pain and pancreatic insufficiency For diarrhea she is taking Creon    NEUROPATHY: She has history of burning in her legs and feet; gabapentin as needed She has been prescribed Cymbalta with some benefit   DEPRESSION: She has had long-standing depression and anxiety on long-term treatment  HYPERLIPIDEMIA: The lipid abnormality consists of elevated LDL   Is  on lovastatin 40 mg with  good control, prescribed by PCP   Lab Results  Component Value Date   CHOL 170 11/10/2022   HDL 83.70 11/10/2022   LDLCALC 73 11/10/2022   LDLDIRECT 101.7 03/26/2012   TRIG 67.0 11/10/2022   CHOLHDL 2 11/10/2022    Memory difficulties: She was given Aricept by the neurologist, is taking 10 mg   Blood pressure is unusually high check twice where she is stressed because of recent death of her sister Her cardiologist has given her metoprolol and unclear why her Marcelline Deist was started with only minimal diastolic dysfunction on her echocardiogram which was reviewed today   Examination:   BP (!) 150/74   Pulse 71   Ht 5\' 3"  (1.6 m)   Wt 111 lb 6.4 oz (50.5 kg)   SpO2  97%   BMI 19.73 kg/m   Body mass index is 19.73 kg/m.    ASSESSMENT/ PLAN:   Diabetes type 1 longstanding  See history of present illness for detailed discussion of current insulin, blood sugar patterns, problems identified  A1c has been relatively good plan and now 6.8  She is on low-dose Levemir insulin and generally very low-dose of Humalog at meals Recently started on Farxiga by cardiologist but discussed that this is contraindicated with her type 1 diabetes  Her blood sugar is difficult to control at the poor because of: Variable intake of meals and snacks at different times  She appears to be more sensitive to insulin at suppertime and likely does not require any mealtime coverage when she is only eating one starch Overnight blood sugars are very variable including occasional hypoglycemia She is usually overtreating low sugars Hypoglycemia unawareness still present Not a good candidate for an insulin pump as she has difficulty with the Dexcom sensor and likely will have difficulty learning the insulin pump operation  Gastroparesis: Symptoms are now controlled  Increased blood pressure: This is likely stress related and she will check blood pressure at home and follow-up with her cardiologist also  Recommendations:  She will take her Levemir in the evenings around 7-8 PM instead of waiting till bedtime to make sure she takes it consistently Do not take any Humalog at suppertime Avoid taking extra Humalog late in the evening Avoid stacking insulin doses with her Humalog If blood sugars are low normal she will need to have a snack before going shopping or other physical activities She will stop [place] and message was sent to her cardiologist  Total visit time including counseling = 30 minutes  Patient Instructions  No Humalog at supper  Take 9 Levemir at 8 pm  Do not take Wenda [place] 01/29/2023, 8:42 PM

## 2023-01-29 NOTE — Patient Instructions (Addendum)
No Humalog at supper  Take 9 Levemir at 8 pm  Do not take [place]

## 2023-01-30 ENCOUNTER — Encounter: Payer: Self-pay | Admitting: Endocrinology

## 2023-02-09 ENCOUNTER — Other Ambulatory Visit: Payer: Self-pay | Admitting: Physician Assistant

## 2023-02-12 ENCOUNTER — Other Ambulatory Visit: Payer: Self-pay | Admitting: Endocrinology

## 2023-02-14 ENCOUNTER — Other Ambulatory Visit (HOSPITAL_COMMUNITY): Payer: Self-pay | Admitting: Psychiatry

## 2023-02-14 DIAGNOSIS — F4010 Social phobia, unspecified: Secondary | ICD-10-CM

## 2023-02-14 DIAGNOSIS — G4701 Insomnia due to medical condition: Secondary | ICD-10-CM

## 2023-02-14 DIAGNOSIS — F332 Major depressive disorder, recurrent severe without psychotic features: Secondary | ICD-10-CM

## 2023-02-15 ENCOUNTER — Other Ambulatory Visit (HOSPITAL_COMMUNITY): Payer: Self-pay

## 2023-02-15 ENCOUNTER — Ambulatory Visit (HOSPITAL_COMMUNITY): Payer: Medicare Other | Admitting: Psychiatry

## 2023-02-15 DIAGNOSIS — G4701 Insomnia due to medical condition: Secondary | ICD-10-CM

## 2023-02-15 DIAGNOSIS — F4010 Social phobia, unspecified: Secondary | ICD-10-CM

## 2023-02-15 DIAGNOSIS — F332 Major depressive disorder, recurrent severe without psychotic features: Secondary | ICD-10-CM

## 2023-02-15 MED ORDER — LAMOTRIGINE 100 MG PO TABS
100.0000 mg | ORAL_TABLET | Freq: Two times a day (BID) | ORAL | 0 refills | Status: DC
Start: 2023-02-15 — End: 2023-04-12

## 2023-02-15 MED ORDER — TRAZODONE HCL 100 MG PO TABS
200.0000 mg | ORAL_TABLET | Freq: Every day | ORAL | 0 refills | Status: DC
Start: 2023-02-15 — End: 2023-04-12

## 2023-02-16 ENCOUNTER — Telehealth (HOSPITAL_COMMUNITY): Payer: Medicare Other | Admitting: Psychiatry

## 2023-02-17 DIAGNOSIS — E1065 Type 1 diabetes mellitus with hyperglycemia: Secondary | ICD-10-CM | POA: Diagnosis not present

## 2023-02-23 ENCOUNTER — Other Ambulatory Visit: Payer: Self-pay | Admitting: Physician Assistant

## 2023-02-26 ENCOUNTER — Telehealth (HOSPITAL_COMMUNITY): Payer: Medicare Other | Admitting: Psychiatry

## 2023-02-27 ENCOUNTER — Ambulatory Visit: Payer: Medicare Other | Admitting: Physician Assistant

## 2023-02-27 ENCOUNTER — Encounter: Payer: Self-pay | Admitting: Physician Assistant

## 2023-02-27 VITALS — BP 113/52 | HR 70 | Resp 18 | Ht 63.0 in | Wt 115.0 lb

## 2023-02-27 DIAGNOSIS — F0670 Mild neurocognitive disorder due to known physiological condition without behavioral disturbance: Secondary | ICD-10-CM | POA: Diagnosis not present

## 2023-02-27 MED ORDER — DONEPEZIL HCL 10 MG PO TABS: 10.0000 mg | ORAL_TABLET | Freq: Every morning | ORAL | 3 refills | Status: DC

## 2023-02-27 NOTE — Progress Notes (Signed)
Assessment/Plan:   Memory Impairment of multiple etiologies  Leslie Lane is a very pleasant 67 y.o. RH female with a history of type 1 diabetes mellitus, hyperlipidemia, fibromyalgia, history of migraines, major depressive disorder, sleep disorder, GAD, bipolar disorder, cutaneous lupus and with a history of mild cognitive impairment per neuropsych evaluation 03/29/2021 (which was discontinued prematurely due to anxiety) presenting today in follow-up for evaluation of memory loss. Patient is on donepezil 10 mg daily.MMSE today is 28/30. She continues to smoke, cessation has been counseled. Mood is controlled by Psychiatry. Able to participate on her ADLs and drives without difficulty.      Recommendations:   Follow up in 6  months. Continue donepezil 10 mg daily, side effects discussed Continue mood control as per psychiatry, for social anxiety disorder, insomnia and severe MDD, continue Lexapro, Lamictal and trazodone. Next appt is on Oct 2024   Discontinue tobacco use Recommend good control of cardiovascular risk factors, especially BP and sugars  Increase outside activities for social stimulation    Subjective:   This patient is here alone.  Previous records as well as any outside records available were reviewed prior to todays visit.   Patient was last seen on 08/23/2022 with MMSE 30/30.     Any changes in memory since last visit? "  She has subjective memory loss complaints, reporting more trouble retrieving words than before but then adding "when I speak too fast, I am rushing too much and I do not pay attention".  She is able to retain recent conversations and names. repeats oneself?  Endorsed.  She compares herself to her mother who had dementia, which brings high level of anxiety. She enjoys going to [employer]. Disoriented when walking into a room?  Patient denies except occasionally not remembering what patient came to the room for    Leaving objects in unusual places?   Patient denies   Wandering behavior?   denies   Any personality changes since last visit?   denies   Any worsening depression?:  She has a history of MDD, and recently lost her sister to Cancer.. She sees a psychiatrist.  Hallucinations or paranoia?  denies   Seizures?   denies    Any sleep changes? Sleeps well with trazodone.  Denies vivid dreams, REM behavior or sleepwalking   Sleep apnea?   denies   Any hygiene concerns?   denies   Independent of bathing and dressing?  Endorsed  Does the patient needs help with medications? Patient is in charge  Who is in charge of the finances?  Patient is in charge    Any changes in appetite?  Denies.    Patient have trouble swallowing?  Denies.   Does the patient cook?  Yes. Any kitchen accidents such as leaving the stove on?   denies   Any headaches?    Had a few headaches during the time that her sister died on 02-01-23.    Vision changes? Denies. Chronic pain?  She has chronic right shoulder pain, and chronic pain due to fibromyalgia. Ambulates with difficulty?    Denies.   Recent falls or head injuries?    Denies.      Unilateral weakness, numbness or tingling? Denies.   Any tremors?  Denies.   Any anosmia?    denies   Any incontinence of urine?  Urge incontinence, uses pads. Any bowel dysfunction?  She has chronic diarrhea, due to pancreatic insufficiency.     Patient lives with her mother  Does the patient drive?  Only during the day Tobacco?  She smokes 1 carton every 2 weeks, trying to quit     History on Initial Assessment 12/18/2018: This is a 67 year old woman with a history of type I DM, hyperlipidemia, fibromyalgia, migraines, major depressive disorder, GAD, rule out bipolar disorder, presenting for evaluation of memory loss. She is alone in the office with no family to corroborate history. She states memory changes started after she was in the hospital in a diabetic coma at age 40. She states she has not noticed any changes since then,  memory has been about the same since then. She initially could not drive a car or remember a movie she had watched. She acquired a guardian to manage her finances. She lives with her mother. She has been told she repeats herself and says the same thing every morning. She used to leave the stove on, but has been more vigilant. She has been driving and has gotten lost driving in unfamiliar roads. She manages her medications independently. She states she does not like math and was not good in school. She has reported her memory concerns to her psychiatrist and reported that she cannot focus on anything. She is easily irritable. No paranoia or hallucinations. Sleep is good with Trazodone. No family history of dementia. No history of significant head injuries. She used to drink heavily when younger, then cut down to a beer every now and then after her diabetic coma.    She has headaches that improve when she is not smoking. She has a history of migraines and has mild bitemporal headaches, taking daily magnesium. She has chronic neck and back pain and neuropathy in her feet. No dizziness, vision changes, focal weakness, bowel/bladder dysfunction, anosmia, or tremors.     Neurocognitive testing 03/29/21 Dr. Milbert Coulter Briefly, Ms. Anhorn exhibited very limited testing tolerance and abruptly discontinued the evaluation prematurely. Unfortunately, given the abbreviated nature of testing, several domains were unable to be thoroughly assessed and the current conceptualization is limited by this. Ms. Yanni pattern of performance is suggestive of prominent difficulties with basic attention, expressive language, visuospatial abilities, and encoding (i.e., learning) aspects of memory. Overall, remote substance abuse history, as well as chronic and acute psychiatric distress, is very likely exacerbating deficits related to various vascular conditions. Ongoing sleep dysfunction, chronic pain, and frequent headaches would also  contribute to dysfunction.   Past Medical History:  Diagnosis Date   Abdominal pain 02/28/2016   Abnormal CT scan, bladder    Noted 07/28/13 CT - s/p uro eval 08/2013 [place] -    Acquired trigger finger of left ring finger 02/18/2019   Injected February 18, 2019 and March 2022   Allergic rhinitis    Anemia    Arthritis    Bipolar disorder 07/05/2010   Cataract    Chronic lower back pain 07/05/2010   Chronic neck pain    Diabetic gastroparesis associated with type 1 diabetes mellitus 10/24/2016   Fibromyalgia    Generalized anxiety disorder 04/27/2014   GERD (gastroesophageal reflux disease) 07/05/2010   Hepatitis B virus infection 07/05/2010   History of substance abuse    opiates; cocaine   Hypoglycemia unawareness associated with type 1 diabetes mellitus 11/23/2020   Insomnia 04/13/2015   Major depressive disorder 07/28/2014   Migraine headache 07/05/2010   Mild neurocognitive disorder due to multiple etiologies 03/15/2021   Mixed diabetic hyperlipidemia associated with type 1 diabetes mellitus 07/05/2010   Neuropathy    Protein  calorie malnutrition 06/01/2017   Right shoulder pain 02/18/2018   Seizures    pt states, "if my blood sugar drops to the 20's, I convulse.  It hasn't happened in a long time."   Smokers' cough 07/05/2010   Tubular adenoma of colon 2018   Type 1 diabetes mellitus with complication 02/06/2013   WBC decreased 03/27/2017     Past Surgical History:  Procedure Laterality Date   CESAREAN SECTION     COLONOSCOPY     TUBAL LIGATION     UPPER GASTROINTESTINAL ENDOSCOPY       PREVIOUS MEDICATIONS:   CURRENT MEDICATIONS:  Outpatient Encounter Medications as of 02/27/2023  Medication Sig   Accu-Chek Softclix Lancets lancets Use as instructed to check blood sugar 4 times per day dx code E10.65   Alcohol Swabs (B-D SINGLE USE SWABS REGULAR) PADS USE FOUR TIMES DAILY   aspirin EC 81 MG tablet Take 81 mg by mouth every morning.   B-D ULTRAFINE III SHORT  PEN 31G X 8 MM MISC USE TO INJECT WITH PRE-FILLED PEN SYRINGE SUB-Q DAILY   Blood Glucose Calibration (ACCU-CHEK AVIVA) SOLN USE AS DIRECTED   Blood Glucose Monitoring Suppl (ACCU-CHEK AVIVA PLUS) w/Device KIT USE AS DIRECTED   cetirizine (ZYRTEC) 10 MG tablet Take 1 tablet (10 mg total) by mouth daily.   Clobetasol Propionate 0.05 % shampoo Apply topically.   Continuous Blood Gluc Receiver (FREESTYLE LIBRE 3 READER) DEVI 1 Device by Does not apply route continuous.   Continuous Blood Gluc Sensor (FREESTYLE LIBRE 2 SENSOR) MISC Use to check blood sugar daily   Continuous Blood Gluc Sensor (FREESTYLE LIBRE 3 SENSOR) MISC Place 1 sensor on the skin every 14 days. Use to check glucose continuously   cyclobenzaprine (FLEXERIL) 5 MG tablet Take 1 tablet (5 mg total) by mouth 3 (three) times daily as needed for muscle spasms.   dapagliflozin propanediol (FARXIGA) 10 MG TABS tablet Take 1 tablet (10 mg total) by mouth daily.   escitalopram (LEXAPRO) 5 MG tablet Take 1 tablet (5 mg total) by mouth daily.   fexofenadine (ALLEGRA) 180 MG tablet Take 180 mg by mouth daily.   gabapentin (NEURONTIN) 300 MG capsule TAKE 1 CAPSULE(300 MG) BY MOUTH THREE TIMES DAILY   glucose blood (ACCU-CHEK AVIVA PLUS) test strip CHECK BLOOD SUGAR 4 TIMES DAILY   GVOKE HYPOPEN 2-PACK 1 MG/0.2ML SOAJ INJECT 1 MG UNDER THE SKIN AS NEEDED FOR LOW BLOOD SUGAR.   hyoscyamine (LEVBID) 0.375 MG 12 hr tablet Take 1 tablet (0.375 mg total) by mouth 2 (two) times daily.   insulin detemir (LEVEMIR FLEXTOUCH) 100 UNIT/ML FlexPen Inject 3 units in the morning and 6 units at bedtime   insulin lispro (HUMALOG KWIKPEN) 100 UNIT/ML KwikPen INJECT SUBCUTANEOUSLY 1 TO 3  UNITS 3 TIMES DAILY FOR LARGE  MEALS   Insulin Pen Needle 32G X 4 MM MISC Use inject insulin tid   Insulin Syringe-Needle U-100 (B-D INSULIN SYRINGE) 31G X 5/16" 0.3 ML MISC Use to administer insulin   lamoTRIgine (LAMICTAL) 100 MG tablet Take 1 tablet (100 mg total) by mouth 2  (two) times daily.   lipase/protease/amylase (CREON) 36000 UNITS CPEP capsule Take 2 capsules by mouth three times a day with meals and 1 capsule by mouth twice daily with snacks   lovastatin (MEVACOR) 40 MG tablet Take 1.5 tablets (60 mg total) by mouth at bedtime.   Magnesium 500 MG TABS Take 1 tablet by mouth daily.   metoprolol succinate (TOPROL XL) 25 MG  24 hr tablet Take 1 tablet (25 mg total) by mouth daily.   Multiple Vitamin (MULTIVITAMIN) tablet Take 1 tablet by mouth daily. Reported on 11/22/2015   Omega-3 Fatty Acids (FISH OIL) 1000 MG CAPS Take 1 capsule by mouth daily.   omeprazole (PRILOSEC) 40 MG capsule TAKE 1 CAPSULE(40 MG) BY MOUTH IN THE MORNING AND AT BEDTIME   traZODone (DESYREL) 100 MG tablet Take 2 tablets (200 mg total) by mouth at bedtime.   triamcinolone cream (KENALOG) 0.1 %    umeclidinium-vilanterol (ANORO ELLIPTA) 62.5-25 MCG/ACT AEPB INHALE 1 PUFF INTO THE LUNGS DAILY   vitamin E 1000 UNIT capsule Take 1,000 Units by mouth daily.   [DISCONTINUED] donepezil (ARICEPT) 10 MG tablet TAKE 1 TABLET BY MOUTH AT NIGHT   donepezil (ARICEPT) 10 MG tablet Take 1 tablet (10 mg total) by mouth every morning.   No facility-administered encounter medications on file as of 02/27/2023.     Objective:     PHYSICAL EXAMINATION:    VITALS:   Vitals:   02/27/23 1304  BP: (!) 113/52  Pulse: 70  Resp: 18  SpO2: 98%  Weight: 115 lb (52.2 kg)  Height: 5\' 3"  (1.6 m)    GEN:  The patient appears stated age and is in NAD. HEENT:  Normocephalic, atraumatic.   Neurological examination:  General: NAD, well-groomed, appears stated age. Orientation: The patient is alert. Oriented to person, place and date Cranial nerves: There is good facial symmetry. Anxious appearing. The speech is fluent and clear, fast talking. No aphasia or dysarthria. Fund of knowledge is appropriate. Recent memory impaired and remote memory is normal.  Attention and concentration are normal.  Able to  name objects and repeat phrases.  Hearing is intact to conversational tone.  Delayed recall 3/3 Sensation: Sensation is intact to light touch throughout Motor: Strength is at least antigravity x4. DTR's 2/4 in UE/LE      07/30/2019    1:00 PM 12/18/2018   11:00 AM  Montreal Cognitive Assessment   Visuospatial/ Executive (0/5)  1  Naming (0/3)  3  Attention: Read list of digits (0/2) 1 1  Attention: Read list of letters (0/1) 1 1  Attention: Serial 7 subtraction starting at 100 (0/3) 1 2  Language: Repeat phrase (0/2) 0 0  Language : Fluency (0/1) 0 0  Abstraction (0/2) 2 0  Delayed Recall (0/5) 4 0  Orientation (0/6) 6 5  Total  13  Adjusted Score (based on education)  14       02/27/2023    1:00 PM 08/23/2022    1:00 PM 12/03/2020    3:00 PM  MMSE - Mini Mental State Exam  Orientation to time 4 5 5   Orientation to Place 5 5 5   Registration 3 3 3   Attention/ Calculation 5 5 0  Recall 3 3 0  Language- name 2 objects 2 2 2   Language- repeat 1 1 1   Language- follow 3 step command 3 3 3   Language- read & follow direction 1 1 1   Write a sentence 1 1 1   Copy design 0 1 1  Total score 28 30 22        Movement examination: Tone: There is normal tone in the UE/LE Abnormal movements:  no tremor.  No myoclonus.  No asterixis.   Coordination:  There is no decremation with RAM's. Normal finger to nose  Gait and Station: The patient has no difficulty arising out of a deep-seated chair without the use of the  hands. The patient's stride length is good.  Gait is cautious and narrow.   Thank you for allowing [place] the opportunity to participate in the care of this nice patient. Please do not hesitate to contact us for any questions or concerns.   Total time spent on today's visit was 26 minutes dedicated to this patient today, preparing to see patient, examining the patient, ordering tests and/or medications and counseling the patient, documenting clinical information in the EHR or other  health record, independently interpreting results and communicating results to the patient/family, discussing treatment and goals, answering patient's questions and coordinating care.  Cc:  Myrlene Broker, MD  Marlowe Kays 02/27/2023 1:32 PM

## 2023-02-27 NOTE — Patient Instructions (Addendum)
It was a pleasure to see you today at our office.   Recommendations:  Meds: Continue Donepezil 10 mg: take 1 tablet in the morning Follow up in 6 months  Monitor your sugars  Follow up with psychiatry, GI, Primary doctors  Stop smoking   Get involved with activities outside of the house   RECOMMENDATIONS FOR ALL PATIENTS WITH MEMORY PROBLEMS: 1. Continue to exercise (Recommend 30 minutes of walking everyday, or 3 hours every week) 2. Increase social interactions - continue going to [place] and enjoy social gatherings with friends and family 3. Eat healthy, avoid fried foods and eat more fruits and vegetables 4. Maintain adequate blood pressure, blood sugar, and blood cholesterol level. Reducing the risk of stroke and cardiovascular disease also helps promoting better memory. 5. Avoid stressful situations. Live a simple life and avoid aggravations. Organize your time and prepare for the next day in anticipation. 6. Sleep well, avoid any interruptions of sleep and avoid any distractions in the bedroom that may interfere with adequate sleep quality 7. Avoid sugar, avoid sweets as there is a strong link between excessive sugar intake, diabetes, and cognitive impairment We discussed the Mediterranean diet, which has been shown to help patients reduce the risk of progressive memory disorders and reduces cardiovascular risk. This includes eating fish, eat fruits and green leafy vegetables, nuts like almonds and hazelnuts, walnuts, and also use olive oil. Avoid fast foods and fried foods as much as possible. Avoid sweets and sugar as sugar use has been linked to worsening of memory function.  There is always a concern of gradual progression of memory problems. If this is the case, then we may need to adjust level of care according to patient needs. Support, both to the patient and caregiver, should then be put into place.      You have been referred for a neuropsychological evaluation (i.e.,  evaluation of memory and thinking abilities). Please bring someone with you to this appointment if possible, as it is helpful for the doctor to hear from both you and another adult who knows you well. Please bring eyeglasses and hearing aids if you wear them.    The evaluation will take approximately 3 hours and has two parts:   The first part is a clinical interview with the neuropsychologist (Dr. Milbert Coulter or Dr. Roseanne Reno). During the interview, the neuropsychologist will speak with you and the individual you brought to the appointment.    The second part of the evaluation is testing with the doctor's technician Annabelle Harman or Selena Batten). During the testing, the technician will ask you to remember different types of material, solve problems, and answer some questionnaires. Your family member will not be present for this portion of the evaluation.   Please note: We must reserve several hours of the neuropsychologist's time and the psychometrician's time for your evaluation appointment. As such, there is a No-Show fee of $100. If you are unable to attend any of your appointments, please contact our office as soon as possible to reschedule.    FALL PRECAUTIONS: Be cautious when walking. Scan the area for obstacles that may increase the risk of trips and falls. When getting up in the mornings, sit up at the edge of the bed for a few minutes before getting out of bed. Consider elevating the bed at the head end to avoid drop of blood pressure when getting up. Walk always in a well-lit room (use night lights in the walls). Avoid area rugs or power cords from appliances  in the middle of the walkways. Use a walker or a cane if necessary and consider physical therapy for balance exercise. Get your eyesight checked regularly.  FINANCIAL OVERSIGHT: Supervision, especially oversight when making financial decisions or transactions is also recommended.  HOME SAFETY: Consider the safety of the kitchen when operating appliances like  stoves, microwave oven, and blender. Consider having supervision and share cooking responsibilities until no longer able to participate in those. Accidents with firearms and other hazards in the house should be identified and addressed as well.   ABILITY TO BE LEFT ALONE: If patient is unable to contact 911 operator, consider using LifeLine, or when the need is there, arrange for someone to stay with patients. Smoking is a fire hazard, consider supervision or cessation. Risk of wandering should be assessed by caregiver and if detected at any point, supervision and safe proof recommendations should be instituted.  MEDICATION SUPERVISION: Inability to self-administer medication needs to be constantly addressed. Implement a mechanism to ensure safe administration of the medications.   DRIVING: Regarding driving, in patients with progressive memory problems, driving will be impaired. We advise to have someone else do the driving if trouble finding directions or if minor accidents are reported. Independent driving assessment is available to determine safety of driving.   If you are interested in the driving assessment, you can contact the following:  [facility] [employer] in [place] [phone]  [facility] [phone]  [facility] [phone] or [phone]    Mediterranean Diet A Mediterranean diet refers to food and lifestyle choices that are based on the traditions of countries located on the [employer]. This way of eating has been shown to help prevent certain conditions and improve outcomes for people who have chronic diseases, like kidney disease and heart disease. What are tips for following this plan? Lifestyle  Cook and eat meals together with your family, when possible. Drink enough fluid to keep your urine clear or pale yellow. Be physically active every day. This includes: Aerobic exercise like running  or swimming. Leisure activities like gardening, walking, or housework. Get 7-8 hours of sleep each night. If recommended by your health care provider, drink red wine in moderation. This means 1 glass a day for nonpregnant women and 2 glasses a day for men. A glass of wine equals 5 oz (150 mL). Reading food labels  Check the serving size of packaged foods. For foods such as rice and pasta, the serving size refers to the amount of cooked product, not dry. Check the total fat in packaged foods. Avoid foods that have saturated fat or trans fats. Check the ingredients list for added sugars, such as corn syrup. Shopping  At the grocery store, buy most of your food from the areas near the walls of the store. This includes: Fresh fruits and vegetables (produce). Grains, beans, nuts, and seeds. Some of these may be available in unpackaged forms or large amounts (in bulk). Fresh seafood. Poultry and eggs. Low-fat dairy products. Buy whole ingredients instead of prepackaged foods. Buy fresh fruits and vegetables in-season from local farmers markets. Buy frozen fruits and vegetables in resealable bags. If you do not have access to quality fresh seafood, buy precooked frozen shrimp or canned fish, such as tuna, salmon, or sardines. Buy small amounts of raw or cooked vegetables, salads, or olives from the deli or salad bar at your store. Stock your pantry so you always have certain foods on hand, such as olive  oil, canned tuna, canned tomatoes, rice, pasta, and beans. Cooking  Cook foods with extra-virgin olive oil instead of using butter or other vegetable oils. Have meat as a side dish, and have vegetables or grains as your main dish. This means having meat in small portions or adding small amounts of meat to foods like pasta or stew. Use beans or vegetables instead of meat in common dishes like chili or lasagna. Experiment with different cooking methods. Try roasting or broiling vegetables instead of  steaming or sauteing them. Add frozen vegetables to soups, stews, pasta, or rice. Add nuts or seeds for added healthy fat at each meal. You can add these to yogurt, salads, or vegetable dishes. Marinate fish or vegetables using olive oil, lemon juice, garlic, and fresh herbs. Meal planning  Plan to eat 1 vegetarian meal one day each week. Try to work up to 2 vegetarian meals, if possible. Eat seafood 2 or more times a week. Have healthy snacks readily available, such as: Vegetable sticks with hummus. Greek yogurt. Fruit and nut trail mix. Eat balanced meals throughout the week. This includes: Fruit: 2-3 servings a day Vegetables: 4-5 servings a day Low-fat dairy: 2 servings a day Fish, poultry, or lean meat: 1 serving a day Beans and legumes: 2 or more servings a week Nuts and seeds: 1-2 servings a day Whole grains: 6-8 servings a day Extra-virgin olive oil: 3-4 servings a day Limit red meat and sweets to only a few servings a month What are my food choices? Mediterranean diet Recommended Grains: Whole-grain pasta. Brown rice. Bulgar wheat. Polenta. Couscous. Whole-wheat bread. Orpah Cobb. Vegetables: Artichokes. Beets. Broccoli. Cabbage. Carrots. Eggplant. Green beans. Chard. Kale. Spinach. Onions. Leeks. Peas. Squash. Tomatoes. Peppers. Radishes. Fruits: Apples. Apricots. Avocado. Berries. Bananas. Cherries. Dates. Figs. Grapes. Lemons. Melon. Oranges. Peaches. Plums. Pomegranate. Meats and other protein foods: Beans. Almonds. Sunflower seeds. Pine nuts. Peanuts. Cod. Salmon. Scallops. Shrimp. Tuna. Tilapia. Clams. Oysters. Eggs. Dairy: Low-fat milk. Cheese. Greek yogurt. Beverages: Water. Red wine. Herbal tea. Fats and oils: Extra virgin olive oil. Avocado oil. Grape seed oil. Sweets and desserts: [place] yogurt with honey. Baked apples. Poached pears. Trail mix. Seasoning and other foods: Basil. Cilantro. Coriander. Cumin. Mint. Parsley. Sage. Rosemary. Tarragon. Garlic.  Oregano. Thyme. Pepper. Balsalmic vinegar. Tahini. Hummus. Tomato sauce. Olives. Mushrooms. Limit these Grains: Prepackaged pasta or rice dishes. Prepackaged cereal with added sugar. Vegetables: Deep fried potatoes (french fries). Fruits: Fruit canned in syrup. Meats and other protein foods: Beef. Pork. Lamb. Poultry with skin. Hot dogs. Tomasa Blase. Dairy: Ice cream. Sour cream. Whole milk. Beverages: Juice. Sugar-sweetened soft drinks. Beer. Liquor and spirits. Fats and oils: Butter. Canola oil. Vegetable oil. Beef fat (tallow). Lard. Sweets and desserts: Cookies. Cakes. Pies. Candy. Seasoning and other foods: Mayonnaise. Premade sauces and marinades. The items listed may not be a complete list. Talk with your dietitian about what dietary choices are right for you. Summary The Mediterranean diet includes both food and lifestyle choices. Eat a variety of fresh fruits and vegetables, beans, nuts, seeds, and whole grains. Limit the amount of red meat and sweets that you eat. Talk with your health care provider about whether it is safe for you to drink red wine in moderation. This means 1 glass a day for nonpregnant women and 2 glasses a day for men. A glass of wine equals 5 oz (150 mL). This information is not intended to replace advice given to you by your health care provider. Make sure you discuss any questions  you have with your health care provider. Document Released: 02/10/2016 Document Revised: 03/14/2016 Document Reviewed: 02/10/2016 Elsevier Interactive Patient Education  2017 [employer].

## 2023-03-15 ENCOUNTER — Telehealth: Payer: Self-pay

## 2023-03-15 DIAGNOSIS — E1065 Type 1 diabetes mellitus with hyperglycemia: Secondary | ICD-10-CM

## 2023-03-15 MED ORDER — GVOKE HYPOPEN 2-PACK 1 MG/0.2ML ~~LOC~~ SOAJ
SUBCUTANEOUS | 2 refills | Status: DC
Start: 2023-03-15 — End: 2023-06-04

## 2023-03-15 NOTE — Telephone Encounter (Signed)
Medication requested okay to send

## 2023-03-30 ENCOUNTER — Ambulatory Visit
Admission: RE | Admit: 2023-03-30 | Discharge: 2023-03-30 | Disposition: A | Discharge status: Home / Self Care | Payer: Medicare Other | Source: Ambulatory Visit | Attending: Internal Medicine | Admitting: Internal Medicine

## 2023-03-30 DIAGNOSIS — Z87891 Personal history of nicotine dependence: Secondary | ICD-10-CM | POA: Diagnosis not present

## 2023-03-30 DIAGNOSIS — F17210 Nicotine dependence, cigarettes, uncomplicated: Secondary | ICD-10-CM

## 2023-03-30 DIAGNOSIS — Z122 Encounter for screening for malignant neoplasm of respiratory organs: Secondary | ICD-10-CM

## 2023-04-01 ENCOUNTER — Other Ambulatory Visit: Payer: Self-pay | Admitting: Physician Assistant

## 2023-04-03 ENCOUNTER — Other Ambulatory Visit: Payer: Self-pay | Admitting: Internal Medicine

## 2023-04-12 ENCOUNTER — Encounter (HOSPITAL_COMMUNITY): Payer: Self-pay | Admitting: Psychiatry

## 2023-04-12 ENCOUNTER — Other Ambulatory Visit: Payer: Self-pay

## 2023-04-12 ENCOUNTER — Ambulatory Visit (HOSPITAL_BASED_OUTPATIENT_CLINIC_OR_DEPARTMENT_OTHER): Payer: 59 | Admitting: Psychiatry

## 2023-04-12 VITALS — BP 116/68 | HR 88 | Ht 63.5 in | Wt 113.6 lb

## 2023-04-12 DIAGNOSIS — G4701 Insomnia due to medical condition: Secondary | ICD-10-CM

## 2023-04-12 DIAGNOSIS — F332 Major depressive disorder, recurrent severe without psychotic features: Secondary | ICD-10-CM | POA: Diagnosis not present

## 2023-04-12 DIAGNOSIS — F4010 Social phobia, unspecified: Secondary | ICD-10-CM

## 2023-04-12 MED ORDER — LAMOTRIGINE 100 MG PO TABS: 100.0000 mg | ORAL_TABLET | Freq: Two times a day (BID) | ORAL | 0 refills | Status: DC

## 2023-04-12 MED ORDER — TRAZODONE HCL 100 MG PO TABS
200.0000 mg | ORAL_TABLET | Freq: Every day | ORAL | 0 refills | Status: DC
Start: 2023-04-12 — End: 2023-07-26

## 2023-04-12 NOTE — Progress Notes (Signed)
BH MD/PA/NP OP Progress Note  Patient location; office Provider location; office  04/12/2023 11:34 AM Leslie Lane  MRN:  [id number]  Chief Complaint:  Chief Complaint  Patient presents with   Establish Care   HPI: Leslie Lane is 67 years divorced female who is on disability for chronic medical issues.  She is a patient of Dr. Michae Kava who had left the practice.  Patient taking Lamictal and trazodone.  She had tried multiple medication however either she had a side effects or they were ineffective.  Recent medication was Lexapro which had worked in the past but this time it did not help.  She does not want to try new medication because she feel her symptoms are manageable.  She reported her chronic stress is living with the mother who is 50 year old.  She reported she makes her irritated and agitated and sometimes she has to walk out to smoke cigarettes to calm her down.  Patient appears pleasant during the session but she also reported had a lot of anxiety and nervousness around people.  She has 1 close friend and she does talk to her daughter and see her on a regular basis.  Patient also tried to see her sister and a brother who lives in town.  She watches TV especially on Sunday church activities.  She denies any hallucination, paranoia, suicidal thoughts.  During the session she appears restless, jittery and keyed taking the posture.  Patient told that she always liked this and she have to keep moving to calm herself.  Patient has diabetes and her last hemoglobin A1c is 6.8 which is slightly better than before.   Visit Diagnosis:    ICD-10-CM   1. Major depressive disorder, recurrent, severe without psychotic features (HCC)  F33.2 traZODone (DESYREL) 100 MG tablet    lamoTRIgine (LAMICTAL) 100 MG tablet    2. Insomnia due to medical condition  G47.01 traZODone (DESYREL) 100 MG tablet    3. Social anxiety disorder  F40.10 lamoTRIgine (LAMICTAL) 100 MG tablet      Past Psychiatric History:  Reviewed History of cutting her arm at very young age.  History of overdose on her diabetes medication at age 67.  History of drinking and seen mental health provider her life.  She had tried Zoloft, Wellbutrin, BuSpar paroxetine and recently Lexapro.  No history of psychosis.  History of severe social anxiety.   Psycho-Social History:  Patient lives in [place] with her mother who is 44 year old.  One of her sister recently deceased in 02/17/2023 due to cancer.  Patient married once but marriage ended after she find out about his cheating.  Patient has a daughter who live close by.  Patient finished high school and worked in the past until she became disabled due to chronic medical health issues.  Patient is on disability for memory issues.   Past Medical History:  Past Medical History:  Diagnosis Date   Abdominal pain 02/28/2016   Abnormal CT scan, bladder    Noted 07/28/13 CT - s/p uro eval 08/2013 [place] -    Acquired trigger finger of left ring finger 02/18/2019   Injected February 18, 2019 and March 2022   Allergic rhinitis    Anemia    Arthritis    Bipolar disorder 07/05/2010   Cataract    Chronic lower back pain 07/05/2010   Chronic neck pain    Diabetic gastroparesis associated with type 1 diabetes mellitus 10/24/2016   Fibromyalgia    Generalized anxiety disorder 04/27/2014  GERD (gastroesophageal reflux disease) 07/05/2010   Hepatitis B virus infection 07/05/2010   History of substance abuse    opiates; cocaine   Hypoglycemia unawareness associated with type 1 diabetes mellitus 11/23/2020   Insomnia 04/13/2015   Major depressive disorder 07/28/2014   Migraine headache 07/05/2010   Mild neurocognitive disorder due to multiple etiologies 03/15/2021   Mixed diabetic hyperlipidemia associated with type 1 diabetes mellitus 07/05/2010   Neuropathy    Protein calorie malnutrition 06/01/2017   Right shoulder pain 02/18/2018   Seizures    pt states, "if my blood sugar drops to the  20's, I convulse.  It hasn't happened in a long time."   Smokers' cough 07/05/2010   Tubular adenoma of colon 2018   Type 1 diabetes mellitus with complication 02/06/2013   WBC decreased 03/27/2017    Past Surgical History:  Procedure Laterality Date   CESAREAN SECTION     COLONOSCOPY     TUBAL LIGATION     UPPER GASTROINTESTINAL ENDOSCOPY      Family Psychiatric History: Reviewed  Family History:  Family History  Problem Relation Age of Onset   Arthritis Mother    Diverticulosis Mother    Kidney disease Mother    Hyperlipidemia Mother    Arthritis Father    Kidney disease Father    Kidney cancer Father    Bladder Cancer Sister    Colon cancer Sister 53   Celiac disease Sister    Heart attack Brother    Kidney cancer Paternal Aunt    Diabetes Niece    Hyperlipidemia Other    Anxiety disorder Neg Hx    Bipolar disorder Neg Hx    Depression Neg Hx    Breast cancer Neg Hx    Esophageal cancer Neg Hx    Stomach cancer Neg Hx    Rectal cancer Neg Hx     Social History:  Social History   Socioeconomic History   Marital status: Divorced    Spouse name: Not on file   Number of children: 1   Years of education: 12   Highest education level: High school graduate  Occupational History   Occupation: Disability  Tobacco Use   Smoking status: Every Day    Current packs/day: 1.50    Average packs/day: 1.5 packs/day for 50.0 years (75.0 ttl pk-yrs)    Types: Cigarettes   Smokeless tobacco: Never   Tobacco comments:    Reports she has cut back to a cartoon that now lasts 2 weeks.   Vaping Use   Vaping status: Former  Substance and Sexual Activity   Alcohol use: Yes    Alcohol/week: 7.0 standard drinks of alcohol    Types: 7 Cans of beer per week    Comment: 1 beer nightly   Drug use: Not Currently    Types: Cocaine    Comment: past opiate and cocaine abuse   Sexual activity: Not Currently    Birth control/protection: Surgical, Post-menopausal  Other Topics  Concern   Not on file  Social History Narrative   Divorced, disabled   Lives with parents   Right handed    Retired   Caffeine prn   Social Determinants of Health   Financial Resource Strain: Low Risk  (03/14/2022)   Overall Financial Resource Strain (CARDIA)    Difficulty of Paying Living Expenses: Not hard at all  Food Insecurity: No Food Insecurity (03/14/2022)   Hunger Vital Sign    Worried About Running Out of Food in the  Last Year: Never true    Ran Out of Food in the Last Year: Never true  Transportation Needs: No Transportation Needs (03/14/2022)   PRAPARE - Administrator, Civil Service (Medical): No    Lack of Transportation (Non-Medical): No  Physical Activity: Sufficiently Active (03/14/2022)   Exercise Vital Sign    Days of Exercise per Week: 5 days    Minutes of Exercise per Session: 30 min  Stress: Stress Concern Present (03/14/2022)   [employer] of Occupational Health - Occupational Stress Questionnaire    Feeling of Stress : To some extent  Social Connections: Socially Integrated (03/14/2022)   Social Connection and Isolation Panel [NHANES]    Frequency of Communication with Friends and Family: More than three times a week    Frequency of Social Gatherings with Friends and Family: More than three times a week    Attends Religious Services: More than 4 times per year    Active Member of [employer] or Organizations: Yes    Attends Engineer, structural: More than 4 times per year    Marital Status: Married    Allergies:  Allergies  Allergen Reactions   Penicillins Anaphylaxis   Sulfonamide Derivatives Anaphylaxis    Metabolic Disorder Labs: Lab Results  Component Value Date   HGBA1C 6.8 (H) 01/24/2023   No results found for: "PROLACTIN" Lab Results  Component Value Date   CHOL 170 11/10/2022   TRIG 67.0 11/10/2022   HDL 83.70 11/10/2022   CHOLHDL 2 11/10/2022   VLDL 13.4 11/10/2022   LDLCALC 73 11/10/2022   LDLCALC 81  06/07/2021   Lab Results  Component Value Date   TSH 0.71 06/07/2021   TSH 1.78 06/13/2018    Therapeutic Level Labs: No results found for: "LITHIUM" No results found for: "VALPROATE" No results found for: "CBMZ"  Current Medications: Current Outpatient Medications  Medication Sig Dispense Refill   Accu-Chek Softclix Lancets lancets Use as instructed to check blood sugar 4 times per day dx code E10.65 400 each 1   Alcohol Swabs (B-D SINGLE USE SWABS REGULAR) PADS USE FOUR TIMES DAILY 400 each 2   aspirin EC 81 MG tablet Take 81 mg by mouth every morning.     B-D ULTRAFINE III SHORT PEN 31G X 8 MM MISC USE TO INJECT WITH PRE-FILLED PEN SYRINGE SUB-Q DAILY 400 each 2   Blood Glucose Calibration (ACCU-CHEK AVIVA) SOLN USE AS DIRECTED 1 each 1   Blood Glucose Monitoring Suppl (ACCU-CHEK AVIVA PLUS) w/Device KIT USE AS DIRECTED 1 kit 0   cetirizine (ZYRTEC) 10 MG tablet Take 1 tablet (10 mg total) by mouth daily. 90 tablet 3   Continuous Blood Gluc Receiver (FREESTYLE LIBRE 3 READER) DEVI 1 Device by Does not apply route continuous. 1 each 0   Continuous Blood Gluc Sensor (FREESTYLE LIBRE 2 SENSOR) MISC Use to check blood sugar daily 2 each 12   Continuous Blood Gluc Sensor (FREESTYLE LIBRE 3 SENSOR) MISC Place 1 sensor on the skin every 14 days. Use to check glucose continuously 6 each 3   dapagliflozin propanediol (FARXIGA) 10 MG TABS tablet Take 1 tablet (10 mg total) by mouth daily. 30 tablet 11   donepezil (ARICEPT) 10 MG tablet TAKE 1 TABLET BY MOUTH AT NIGHT 60 tablet 5   fexofenadine (ALLEGRA) 180 MG tablet Take 180 mg by mouth daily.     gabapentin (NEURONTIN) 300 MG capsule TAKE 1 CAPSULE(300 MG) BY MOUTH THREE TIMES DAILY 90 capsule 5  Glucagon (GVOKE HYPOPEN 2-PACK) 1 MG/0.2ML SOAJ INJECT 1 MG UNDER THE SKIN AS NEEDED FOR LOW BLOOD SUGAR. 0.4 mL 2   glucose blood (ACCU-CHEK AVIVA PLUS) test strip CHECK BLOOD SUGAR 4 TIMES DAILY 400 strip 3   hyoscyamine (LEVBID) 0.375 MG 12 hr  tablet Take 1 tablet (0.375 mg total) by mouth 2 (two) times daily. 60 tablet 11   insulin detemir (LEVEMIR FLEXTOUCH) 100 UNIT/ML FlexPen Inject 3 units in the morning and 6 units at bedtime 15 mL 5   insulin lispro (HUMALOG KWIKPEN) 100 UNIT/ML KwikPen INJECT SUBCUTANEOUSLY 1 TO 3  UNITS 3 TIMES DAILY FOR LARGE  MEALS 15 mL 1   Insulin Pen Needle 32G X 4 MM MISC Use inject insulin tid 300 each 2   Insulin Syringe-Needle U-100 (B-D INSULIN SYRINGE) 31G X 5/16" 0.3 ML MISC Use to administer insulin 10 each 2   lipase/protease/amylase (CREON) 36000 UNITS CPEP capsule Take 2 capsules by mouth three times a day with meals and 1 capsule by mouth twice daily with snacks 240 capsule 11   lovastatin (MEVACOR) 40 MG tablet TAKE 1 TABLET BY MOUTH AT  BEDTIME 100 tablet 2   Magnesium 500 MG TABS Take 1 tablet by mouth daily.     metoprolol succinate (TOPROL XL) 25 MG 24 hr tablet Take 1 tablet (25 mg total) by mouth daily. 90 tablet 3   Multiple Vitamin (MULTIVITAMIN) tablet Take 1 tablet by mouth daily. Reported on 11/22/2015     Omega-3 Fatty Acids (FISH OIL) 1000 MG CAPS Take 1 capsule by mouth daily.     omeprazole (PRILOSEC) 40 MG capsule TAKE 1 CAPSULE(40 MG) BY MOUTH IN THE MORNING AND AT BEDTIME 180 capsule 0   traZODone (DESYREL) 100 MG tablet Take 2 tablets (200 mg total) by mouth at bedtime. 180 tablet 0   triamcinolone cream (KENALOG) 0.1 %      umeclidinium-vilanterol (ANORO ELLIPTA) 62.5-25 MCG/ACT AEPB INHALE 1 PUFF INTO THE LUNGS DAILY 60 each 5   vitamin E 1000 UNIT capsule Take 1,000 Units by mouth daily.     Clobetasol Propionate 0.05 % shampoo Apply topically. (Patient not taking: Reported on 04/12/2023)     cyclobenzaprine (FLEXERIL) 5 MG tablet Take 1 tablet (5 mg total) by mouth 3 (three) times daily as needed for muscle spasms. (Patient not taking: Reported on 04/12/2023) 30 tablet 1   escitalopram (LEXAPRO) 5 MG tablet Take 1 tablet (5 mg total) by mouth daily. (Patient not taking:  Reported on 04/12/2023) 50 tablet 0   lamoTRIgine (LAMICTAL) 100 MG tablet Take 1 tablet (100 mg total) by mouth 2 (two) times daily. 180 tablet 0   No current facility-administered medications for this visit.     Musculoskeletal: Strength & Muscle Tone: within normal limits Gait & Station: normal Patient leans: N/A  Psychiatric Specialty Exam: Review of Systems  Blood pressure 116/68, pulse 88, height 5' 3.5" (1.613 m), weight 113 lb 9.6 oz (51.5 kg).Body mass index is 19.81 kg/m.  General Appearance: Casual  Eye Contact:  Fair  Speech:   fast  Volume:  Normal  Mood:  Anxious and Dysphoric  Affect:  Labile  Thought Process:  Descriptions of Associations: Intact  Orientation:  Full (Time, Place, and Person)  Thought Content: Rumination   Suicidal Thoughts:  No  Homicidal Thoughts:  No  Memory:  Immediate;   Good Recent;   Fair Remote;   Fair  Judgement:  Intact  Insight:  Shallow  Psychomotor Activity:  Increased and Restlessness  Concentration:  Concentration: Fair and Attention Span: Fair  Recall:  [employer] of Knowledge: Fair  Language: Good  Akathisia:  No  Handed:  Right  AIMS (if indicated): not done  Assets:  Communication Skills Desire for Improvement Housing Social Support Transportation  ADL's:  Intact  Cognition: WNL  Sleep:  Good   Screenings: AUDIT    Flowsheet Row Clinical Support from 03/14/2022 in [facility]  Alcohol Use Disorder Identification Test Final Score (AUDIT) 4      GAD-7    Flowsheet Row Office Visit from 12/16/2020 in [facility] [place] Office Visit from 06/17/2019 in [facility] Office Visit from 11/27/2017 in [facility]  Total GAD-7 Score 17 19 18       Mini-Mental    Flowsheet Row Office Visit from 02/27/2023 in [facility] Office Visit from 08/23/2022 in [facility] Office  Visit from 12/03/2020 in [facility] Office Visit from 05/30/2017 in [facility]  Total Score (max 30 points ) 28 30 22 27       PHQ2-9    Flowsheet Row Office Visit from 12/05/2022 in [facility] [place] Office Visit from 10[facility]05/2022 in [facility] Office Visit from 04/06/2022 in [facility] Clinical Support from 03/14/2022 in [facility] Office Visit from 12/23/2021 in [facility] [place]  PHQ-2 Total Score 0 3 6 2 2   PHQ-9 Total Score -- 19 24 2 2       Flowsheet Row ED from 12/24/2022 in [facility] Office Visit from 10/05/2022 in [facility] ED from 06/14/2022 in [facility] at [facility]  C-SSRS RISK CATEGORY No Risk Error: Q3, 4, or 5 should not be populated when Q2 is No No Risk        Assessment and Plan: I reviewed notes from previous provider, current medication, blood work results and history.  Patient does not want to try a different medication since she feels the current symptoms are manageable.  Sometimes she feels groggy next day after taking the trazodone 200 mg at bedtime.  I recommend she can try to take 150 mg trazodone.  In the past she had tried numerous antidepressant to help her anxiety but either they cause side effects or active.  She is comfortable with the Lamictal 100 mg twice a day.  She has no rash, itching, tremors or shakes.  Discussed medication side effects and benefits.  Recommend to call us back if she has any question or any concern.  Follow-up in 3 months unless she need an earlier appointment.  Collaboration of Care: Collaboration of Care: Other provider involved in patient's care AEB notes are available in epic to review  Patient/Guardian was  advised Release of Information must be obtained prior to any record release in order to collaborate their care with an outside provider. Patient/Guardian was advised if they have not already done so to contact the registration department to sign all necessary forms in order for [place] to release information regarding their care.   Consent: Patient/Guardian gives verbal consent for treatment and assignment of benefits for services provided during this visit. Patient/Guardian expressed understanding and agreed to proceed.   I spent 35 minutes face-to-face  with the patient during this encounter.  Cleotis Nipper, MD 04/12/2023, 11:34 AM

## 2023-04-16 ENCOUNTER — Other Ambulatory Visit: Payer: Self-pay

## 2023-04-16 DIAGNOSIS — F17210 Nicotine dependence, cigarettes, uncomplicated: Secondary | ICD-10-CM

## 2023-04-16 DIAGNOSIS — Z716 Tobacco abuse counseling: Secondary | ICD-10-CM

## 2023-04-16 DIAGNOSIS — Z87891 Personal history of nicotine dependence: Secondary | ICD-10-CM

## 2023-04-16 DIAGNOSIS — Z122 Encounter for screening for malignant neoplasm of respiratory organs: Secondary | ICD-10-CM

## 2023-04-26 ENCOUNTER — Other Ambulatory Visit (INDEPENDENT_AMBULATORY_CARE_PROVIDER_SITE_OTHER): Payer: Medicare Other

## 2023-04-26 DIAGNOSIS — E1065 Type 1 diabetes mellitus with hyperglycemia: Secondary | ICD-10-CM

## 2023-04-26 LAB — HEMOGLOBIN A1C: Hgb A1c MFr Bld: 7.6 % — ABNORMAL HIGH (ref 4.6–6.5)

## 2023-05-03 ENCOUNTER — Ambulatory Visit: Payer: Medicare Other | Admitting: Endocrinology

## 2023-05-03 ENCOUNTER — Encounter: Payer: Self-pay | Admitting: Endocrinology

## 2023-05-03 VITALS — BP 136/70 | HR 70 | Resp 20 | Ht 63.5 in | Wt 114.2 lb

## 2023-05-03 DIAGNOSIS — E1065 Type 1 diabetes mellitus with hyperglycemia: Secondary | ICD-10-CM | POA: Diagnosis not present

## 2023-05-03 MED ORDER — LEVEMIR FLEXTOUCH 100 UNIT/ML ~~LOC~~ SOPN
PEN_INJECTOR | SUBCUTANEOUS | 5 refills | Status: DC
Start: 2023-05-03 — End: 2023-09-26

## 2023-05-03 MED ORDER — INSULIN LISPRO (1 UNIT DIAL) 100 UNIT/ML (KWIKPEN): PEN_INJECTOR | SUBCUTANEOUS | 4 refills | Status: DC

## 2023-05-03 NOTE — Patient Instructions (Signed)
No change in insulin.  Do not take humalog around bedtime for glucose around 200 or less.   Use Free style libre monitor.

## 2023-05-03 NOTE — Progress Notes (Signed)
Outpatient Endocrinology Note Leslie Shawonda Kerce, MD  05/03/23  Patient's Name: Leslie Lane    DOB: 12/11/1955    MRN: [id number]                                                    REASON OF VISIT: Follow up of type 1 diabetes mellitus  PCP: Myrlene Broker, MD  HISTORY OF PRESENT ILLNESS:   Leslie Lane is a 67 y.o. old female with past medical history listed below, is here for follow up for type 1 diabetes mellitus.   Pertinent Diabetes History: No was diagnosed with type 1 diabetes mellitus in 1967.  Chronic Diabetes Complications : Retinopathy: no. Last ophthalmology exam was done on annually, following with ophthalmology regularly.  Nephropathy: no /microalbuminuria present Peripheral neuropathy: yes, on gabapentin as needed.  Cymbalta. Coronary artery disease: no Stroke: no She has gastroparesis. Followed by gastroenterologist for abdominal pain and pancreatic insufficiency.  Relevant comorbidities and cardiovascular risk factors: Obesity: no Body mass index is 19.91 kg/m.  Hypertension: Yes  Hyperlipidemia : Yes, on statin   Current / Home Diabetic regimen includes: Levemir 4 units in the morning and 9 units at bedtime.  Humalog 2-3 units with breakfast and lunch and 2-3, sometimes 5-6 units units with supper.   Prior diabetic medications:  Glycemic data:   Patient was using freestyle libre 2 with receiver. She has Accu-Chek glucometer not able to download in the clinic today.  Some of the blood sugars reviewed, last 30 days average 132 antimetabolite blood sugars are 185, 107, 127, 132, 136.    Hypoglycemia: Patient has unknown hypoglycemic episodes. Patient has hypoglycemia ? awareness, has a glucagon ER kit.   Factors modifying glucose control: 1.  Diabetic diet assessment: likes to eat junk food.   2.  Staying active or exercising: No formal exercise.  3.  Medication compliance: compliant all of the time.  Interval history  Patient reports she has  some sensor issue and has not been using libre recently and she has been checking with a glucometer.  Discussed about restarting freestyle libre glucose monitoring.  She reports her blood sugar are variable sometimes gets quite high and sometimes goes low.  Diabetes/insulin regimen as reviewed above.  She has been still taking Humalog with supper and around bedtime if blood sugar is high up to 5 to 6 units at the time.  She reports she uses Humalog more and has been running out sooner.  No other complaints today.  REVIEW OF SYSTEMS As per history of present illness.   PAST MEDICAL HISTORY: Past Medical History:  Diagnosis Date   Abdominal pain 02/28/2016   Abnormal CT scan, bladder    Noted 07/28/13 CT - s/p uro eval 08/2013 [place] -    Acquired trigger finger of left ring finger 02/18/2019   Injected February 18, 2019 and March 2022   Allergic rhinitis    Anemia    Arthritis    Bipolar disorder 07/05/2010   Cataract    Chronic lower back pain 07/05/2010   Chronic neck pain    Diabetic gastroparesis associated with type 1 diabetes mellitus 10/24/2016   Fibromyalgia    Generalized anxiety disorder 04/27/2014   GERD (gastroesophageal reflux disease) 07/05/2010   Hepatitis B virus infection 07/05/2010   History of substance abuse  opiates; cocaine   Hypoglycemia unawareness associated with type 1 diabetes mellitus 11/23/2020   Insomnia 04/13/2015   Major depressive disorder 07/28/2014   Migraine headache 07/05/2010   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  It hasn't happened in a long time."   Smokers' cough 07/05/2010   Tubular adenoma of colon 2018   Type 1 diabetes mellitus with complication 02/06/2013    WBC decreased 03/27/2017    PAST SURGICAL HISTORY: Past Surgical History:  Procedure Laterality Date   CESAREAN SECTION     COLONOSCOPY     TUBAL LIGATION     UPPER GASTROINTESTINAL ENDOSCOPY      ALLERGIES: Allergies  Allergen Reactions   Penicillins Anaphylaxis   Sulfonamide Derivatives Anaphylaxis    FAMILY HISTORY:  Family History  Problem Relation Age of Onset   Arthritis Mother    Diverticulosis Mother    Kidney disease Mother    Hyperlipidemia Mother    Arthritis Father    Kidney disease Father    Kidney cancer Father    Bladder Cancer Sister    Colon cancer Sister 80   Celiac disease Sister    Heart attack Brother    Kidney cancer Paternal Aunt    Diabetes Niece    Hyperlipidemia Other    Anxiety disorder Neg Hx    Bipolar disorder Neg Hx    Depression Neg Hx    Breast cancer Neg Hx    Esophageal cancer Neg Hx    Stomach cancer Neg Hx    Rectal cancer Neg Hx     SOCIAL HISTORY: Social History   Socioeconomic History   Marital status: Divorced    Spouse name: Not on file   Number of children: 1   Years of education: 12   Highest education level: High school graduate  Occupational History   Occupation: Disability  Tobacco Use   Smoking status: Every Day    Current packs/day: 1.50    Average packs/day: 1.5 packs/day for 50.0 years (75.0 ttl pk-yrs)    Types: Cigarettes   Smokeless tobacco: Never   Tobacco comments:    Reports she has cut back to a cartoon that now lasts 2 weeks.   Vaping Use   Vaping status: Former  Substance and Sexual Activity   Alcohol use: Yes    Alcohol/week: 7.0 standard drinks of alcohol    Types: 7 Cans of beer per week    Comment: 1 beer nightly   Drug use: Not Currently    Types: Cocaine    Comment: past opiate and cocaine abuse   Sexual activity: Not Currently    Birth control/protection: Surgical, Post-menopausal  Other Topics Concern   Not on file  Social History Narrative   Divorced, disabled   Lives  with parents   Right handed    Retired   Caffeine prn   Social Determinants of Health   Financial Resource Strain: Low Risk  (03/14/2022)   Overall Financial Resource Strain (CARDIA)    Difficulty of Paying Living Expenses: Not hard at all  Food Insecurity: No Food Insecurity (03/14/2022)   Hunger Vital Sign    Worried About Running Out of Food in the Last Year: Never true  Ran Out of Food in the Last Year: Never true  Transportation Needs: No Transportation Needs (03/14/2022)   PRAPARE - Administrator, Civil Service (Medical): No    Lack of Transportation (Non-Medical): No  Physical Activity: Sufficiently Active (03/14/2022)   Exercise Vital Sign    Days of Exercise per Week: 5 days    Minutes of Exercise per Session: 30 min  Stress: Stress Concern Present (03/14/2022)   [employer] of Occupational Health - Occupational Stress Questionnaire    Feeling of Stress : To some extent  Social Connections: Socially Integrated (03/14/2022)   Social Connection and Isolation Panel [NHANES]    Frequency of Communication with Friends and Family: More than three times a week    Frequency of Social Gatherings with Friends and Family: More than three times a week    Attends Religious Services: More than 4 times per year    Active Member of [employer] or Organizations: Yes    Attends Engineer, structural: More than 4 times per year    Marital Status: Married    MEDICATIONS:  Current Outpatient Medications  Medication Sig Dispense Refill   Accu-Chek Softclix Lancets lancets Use as instructed to check blood sugar 4 times per day dx code E10.65 400 each 1   Alcohol Swabs (B-D SINGLE USE SWABS REGULAR) PADS USE FOUR TIMES DAILY 400 each 2   aspirin EC 81 MG tablet Take 81 mg by mouth every morning.     B-D ULTRAFINE III SHORT PEN 31G X 8 MM MISC USE TO INJECT WITH PRE-FILLED PEN SYRINGE SUB-Q DAILY 400 each 2   Blood Glucose Calibration (ACCU-CHEK AVIVA) SOLN USE AS DIRECTED 1  each 1   Blood Glucose Monitoring Suppl (ACCU-CHEK AVIVA PLUS) w/Device KIT USE AS DIRECTED 1 kit 0   cetirizine (ZYRTEC) 10 MG tablet Take 1 tablet (10 mg total) by mouth daily. 90 tablet 3   Clobetasol Propionate 0.05 % shampoo Apply topically.     Continuous Blood Gluc Receiver (FREESTYLE LIBRE 3 READER) DEVI 1 Device by Does not apply route continuous. 1 each 0   Continuous Blood Gluc Sensor (FREESTYLE LIBRE 2 SENSOR) MISC Use to check blood sugar daily 2 each 12   Continuous Blood Gluc Sensor (FREESTYLE LIBRE 3 SENSOR) MISC Place 1 sensor on the skin every 14 days. Use to check glucose continuously 6 each 3   cyclobenzaprine (FLEXERIL) 5 MG tablet Take 1 tablet (5 mg total) by mouth 3 (three) times daily as needed for muscle spasms. 30 tablet 1   dapagliflozin propanediol (FARXIGA) 10 MG TABS tablet Take 1 tablet (10 mg total) by mouth daily. 30 tablet 11   donepezil (ARICEPT) 10 MG tablet TAKE 1 TABLET BY MOUTH AT NIGHT 60 tablet 5   fexofenadine (ALLEGRA) 180 MG tablet Take 180 mg by mouth daily.     gabapentin (NEURONTIN) 300 MG capsule TAKE 1 CAPSULE(300 MG) BY MOUTH THREE TIMES DAILY 90 capsule 5   Glucagon (GVOKE HYPOPEN 2-PACK) 1 MG/0.2ML SOAJ INJECT 1 MG UNDER THE SKIN AS NEEDED FOR LOW BLOOD SUGAR. 0.4 mL 2   glucose blood (ACCU-CHEK AVIVA PLUS) test strip CHECK BLOOD SUGAR 4 TIMES DAILY 400 strip 3   hyoscyamine (LEVBID) 0.375 MG 12 hr tablet Take 1 tablet (0.375 mg total) by mouth 2 (two) times daily. 60 tablet 11   Insulin Pen Needle 32G X 4 MM MISC Use inject insulin tid 300 each 2   Insulin Syringe-Needle U-100 (B-D INSULIN SYRINGE)  31G X 5/16" 0.3 ML MISC Use to administer insulin 10 each 2   lamoTRIgine (LAMICTAL) 100 MG tablet Take 1 tablet (100 mg total) by mouth 2 (two) times daily. 180 tablet 0   lipase/protease/amylase (CREON) 36000 UNITS CPEP capsule Take 2 capsules by mouth three times a day with meals and 1 capsule by mouth twice daily with snacks 240 capsule 11    lovastatin (MEVACOR) 40 MG tablet TAKE 1 TABLET BY MOUTH AT  BEDTIME 100 tablet 2   Magnesium 500 MG TABS Take 1 tablet by mouth daily.     metoprolol succinate (TOPROL XL) 25 MG 24 hr tablet Take 1 tablet (25 mg total) by mouth daily. 90 tablet 3   Multiple Vitamin (MULTIVITAMIN) tablet Take 1 tablet by mouth daily. Reported on 11/22/2015     Omega-3 Fatty Acids (FISH OIL) 1000 MG CAPS Take 1 capsule by mouth daily.     omeprazole (PRILOSEC) 40 MG capsule TAKE 1 CAPSULE(40 MG) BY MOUTH IN THE MORNING AND AT BEDTIME 180 capsule 0   traZODone (DESYREL) 100 MG tablet Take 2 tablets (200 mg total) by mouth at bedtime. 180 tablet 0   triamcinolone cream (KENALOG) 0.1 %      umeclidinium-vilanterol (ANORO ELLIPTA) 62.5-25 MCG/ACT AEPB INHALE 1 PUFF INTO THE LUNGS DAILY 60 each 5   vitamin E 1000 UNIT capsule Take 1,000 Units by mouth daily.     insulin detemir (LEVEMIR FLEXTOUCH) 100 UNIT/ML FlexPen Inject 4 units in the morning and 9 units at bedtime 15 mL 5   insulin lispro (HUMALOG KWIKPEN) 100 UNIT/ML KwikPen INJECT SUBCUTANEOUSLY 2-5  UNITS 3 TIMES DAILY AS INSTRUCTED MAXIMUM 20 UNITS A DAY. 30 mL 4   No current facility-administered medications for this visit.    PHYSICAL EXAM: Vitals:   05/03/23 1103  BP: 136/70  Pulse: 70  Resp: 20  SpO2: 95%  Weight: 114 lb 3.2 oz (51.8 kg)  Height: 5' 3.5" (1.613 m)   Body mass index is 19.91 kg/m.  Wt Readings from Last 3 Encounters:  05/03/23 114 lb 3.2 oz (51.8 kg)  02/27/23 115 lb (52.2 kg)  01/29/23 111 lb 6.4 oz (50.5 kg)    General: Well developed, well nourished female in no apparent distress.  HEENT: AT/[place], no external lesions.  Eyes: Conjunctiva clear and no icterus. Neck: Neck supple  Lungs: Respirations not labored Neurologic: Alert, oriented, normal speech Extremities / Skin: Dry.   Psychiatric: Does not appear depressed or anxious  Diabetic Foot Exam - Simple   No data filed    LABS Reviewed Lab Results  Component  Value Date   HGBA1C 7.6 (H) 04/26/2023   HGBA1C 6.8 (H) 01/24/2023   HGBA1C 7.1 (H) 11/10/2022   Lab Results  Component Value Date   FRUCTOSAMINE 325 (H) 02/28/2021   FRUCTOSAMINE 322 (H) 10/11/2017   FRUCTOSAMINE 296 (H) 01/11/2016   Lab Results  Component Value Date   CHOL 170 11/10/2022   HDL 83.70 11/10/2022   LDLCALC 73 11/10/2022   LDLDIRECT 101.7 03/26/2012   TRIG 67.0 11/10/2022   CHOLHDL 2 11/10/2022   Lab Results  Component Value Date   MICRALBCREAT 0.8 07/04/2022   MICRALBCREAT 2.5 06/07/2021   Lab Results  Component Value Date   CREATININE 0.84 01/24/2023   Lab Results  Component Value Date   GFR 71.23 01/24/2023    ASSESSMENT / PLAN  1. Uncontrolled type 1 diabetes mellitus with hyperglycemia (HCC)     Diabetes Mellitus type 1, complicated  by diabetic neuropathy/gastroparesis. - Diabetic status / severity: Uncontrolled.  Lab Results  Component Value Date   HGBA1C 7.6 (H) 04/26/2023    - Hemoglobin A1c goal : <7%  Discussed about controlling carbohydrates and limiting junk food.  She is not a good candidate for insulin pump as she has difficulty with Dexcom sensor and likely will have difficulty learning insulin pump operation.  No CGM data or enough glucose data available to review.  Limited data on glucometer reviewed as above.  - Medications: See below.  I) continue Levemir 4 units in the morning and 9 units at bedtime. II) Humalog 2 to 5 units with meals 3 times a day.  Advised to use less of Humalog in the evening and with supper to keep blood sugar around 200 range at bedtime.  - Home glucose testing: Encouraged to continue freestyle libre CGM.  Check blood sugar before meals and at bedtime. - Discussed/ Gave Hypoglycemia treatment plan.  She uses glucose tablet and juice to correct hypoglycemia.  She has Glucagon Emergency Kit at home.  # Consult : not required at this time.   # Annual urine for microalbuminuria/ creatinine ratio, no  microalbuminuria currently, will check in 3 months.. Last  Lab Results  Component Value Date   MICRALBCREAT 0.8 07/04/2022    # Foot check nightly / neuropathy, continue gabapentin as needed.  # Annual dilated diabetic eye exams.   - Diet: Eat reasonable portion sizes to promote a healthy weight - Life style / activity / exercise: Discussed.  2. Blood pressure  -  BP Readings from Last 1 Encounters:  05/03/23 136/70    - Control is in target.  - No change in current plans.  3. Lipid status / Hyperlipidemia - Last  Lab Results  Component Value Date   LDLCALC 73 11/10/2022   - Continue lovastatin 40 mg daily.  Managed by PCP.  Diagnoses and all orders for this visit:  Uncontrolled type 1 diabetes mellitus with hyperglycemia (HCC) -     Microalbumin / creatinine urine ratio; Future -     Hemoglobin A1c; Future -     Basic metabolic panel; Future -     insulin detemir (LEVEMIR FLEXTOUCH) 100 UNIT/ML FlexPen; Inject 4 units in the morning and 9 units at bedtime  Other orders -     insulin lispro (HUMALOG KWIKPEN) 100 UNIT/ML KwikPen; INJECT SUBCUTANEOUSLY 2-5  UNITS 3 TIMES DAILY AS INSTRUCTED MAXIMUM 20 UNITS A DAY.    DISPOSITION Follow up in clinic in 3 months suggested.   All questions answered and patient verbalized understanding of the plan.  Leslie Lorenso Quirino, MD [facility] Endocrinology [facility] [address] [place], [address] [place], [place] [address] Phone # [phone]  At least part of this note was generated using voice recognition software. Inadvertent word errors may have occurred, which were not recognized during the proofreading process.

## 2023-05-08 DIAGNOSIS — E1065 Type 1 diabetes mellitus with hyperglycemia: Secondary | ICD-10-CM | POA: Diagnosis not present

## 2023-05-23 ENCOUNTER — Telehealth: Payer: Self-pay

## 2023-05-23 NOTE — Patient Outreach (Signed)
  Care Coordination   Initial Visit Note   05/23/2023 Name: Leslie Lane MRN: [id number] DOB: [birth year]  Leslie Lane is a 67 y.o. year old female who sees Leslie Broker, MD for primary care. I spoke with  Leslie Lane by phone today.  What matters to the patients health and wellness today?  [name] reports type 1 diabetes followed by endocrinologist-last office visit 04/26/23. Patient is receptive to diabetes education.  Goals Addressed             This Visit's Progress    Health Management       Interventions Today    Flowsheet Row Most Recent Value  Chronic Disease   Chronic disease during today's visit Diabetes  General Interventions   General Interventions Discussed/Reviewed General Interventions Discussed, Doctor Visits  [Evaluation of current treatment plan for health condition and patient's adherence to plan.]  Doctor Visits Discussed/Reviewed Doctor Visits Discussed  [reviewed upcoming/scheduled appointments]  Education Interventions   Education Provided Provided Education, Provided Web-based Education  [provided education: type 1 diabetes, dealing with low blood sugar from the drugs you take,  diabetes and diet,  low blood sugar in people with diabetes]  Provided Verbal Education On When to see the doctor, Exercise, Blood Sugar Monitoring, Medication, Labs  [discussed hypoglycemia and treatment, encouraged to notify endocrinologist if frequent blood sugar < 70. advised to take medicatins as prescribed, attend provider visit as scheduled. contact provider with any health questions or concerns.]  Labs Reviewed Hgb A1c  [A1C 7.6 (04/26/23),  up from 6.8 (01/24/23)]  Nutrition Interventions   Nutrition Discussed/Reviewed Nutrition Discussed  [discussed nutrition and importance of protein/fats in keeping blood sugar balanced]  Pharmacy Interventions   Pharmacy Dicussed/Reviewed Pharmacy Topics Discussed  Safety Interventions   Safety Discussed/Reviewed Safety  Discussed            SDOH assessments and interventions completed:  Yes SDOH Interventions Today    Flowsheet Row Most Recent Value  SDOH Interventions   Food Insecurity Interventions Intervention Not Indicated  Housing Interventions Intervention Not Indicated  Transportation Interventions Intervention Not Indicated  Utilities Interventions Intervention Not Indicated     Care Coordination Interventions:  Yes, provided   Follow up plan: Follow up call scheduled for 12/    Encounter Outcome:  Patient Visit Completed   Leslie Sheriff, RN, MSN, BSN, CCM Care Management Coordinator [phone]

## 2023-05-23 NOTE — Patient Instructions (Signed)
Visit Information  Thank you for taking time to visit with me today. Please don't hesitate to contact me if I can be of assistance to you.   Following are the goals we discussed today:  Continue to take medications as prescribed. Continue to attend provider visits as scheduled Continue to eat healthy, lean meats, vegetables, fruits, avoid saturated and transfats Contact provider with health questions or concerns as needed Continue to check blood sugar as recommended and notify provider if questions or concerns  Our next appointment is by telephone on 06/12/23 at 1:15 pm  Please call the care guide team at [phone] if you need to cancel or reschedule your appointment.   If you are experiencing a Mental Health or Behavioral Health Crisis or need someone to talk to, please call the Suicide and Crisis Lifeline: 988 call the [place] National Suicide Prevention Lifeline: 209-413-4310 or TTY: 210-424-5900 TTY 501-347-5154) to talk to a trained counselor call 1-800-273-TALK (toll free, 24 hour hotline)  Kathyrn Sheriff, RN, MSN, BSN, CCM Care Management Coordinator [phone]

## 2023-06-02 ENCOUNTER — Other Ambulatory Visit: Payer: Self-pay | Admitting: Endocrinology

## 2023-06-02 DIAGNOSIS — E1065 Type 1 diabetes mellitus with hyperglycemia: Secondary | ICD-10-CM

## 2023-06-08 ENCOUNTER — Ambulatory Visit: Payer: Medicare Other | Admitting: Internal Medicine

## 2023-06-11 ENCOUNTER — Encounter: Payer: Self-pay | Admitting: Family Medicine

## 2023-06-11 ENCOUNTER — Other Ambulatory Visit: Payer: Self-pay

## 2023-06-11 ENCOUNTER — Ambulatory Visit: Payer: Medicare Other | Admitting: Family Medicine

## 2023-06-11 VITALS — BP 142/68 | HR 71 | Ht 63.5 in | Wt 117.0 lb

## 2023-06-11 DIAGNOSIS — E108 Type 1 diabetes mellitus with unspecified complications: Secondary | ICD-10-CM | POA: Diagnosis not present

## 2023-06-11 DIAGNOSIS — F40298 Other specified phobia: Secondary | ICD-10-CM

## 2023-06-11 DIAGNOSIS — M79645 Pain in left finger(s): Secondary | ICD-10-CM

## 2023-06-11 DIAGNOSIS — M65322 Trigger finger, left index finger: Secondary | ICD-10-CM

## 2023-06-11 MED ORDER — LORAZEPAM 0.5 MG PO TABS
ORAL_TABLET | ORAL | 0 refills | Status: DC
Start: 2023-06-11 — End: 2024-01-24

## 2023-06-11 NOTE — Progress Notes (Unsigned)
I, Stevenson Clinch, CMA acting as a scribe for Clementeen Graham, MD.  Leslie Lane is a 67 y.o. female who presents to [facility] today for finger pain. Pt's last visit for L 4th trigger finger was on 09/02/20 and it was injected.  Today, pt reports pain in the left 2nd finger and right 3rd finger. Having daily pain while driving, at nighttime, and while doing ADLs. The fingers are locking up. Would like to discuss injection for left hand today, pt is RHD.  She is anxious around needles.  Dx imaging: 04/16/20 4th finger XR   Pertinent review of systems: No fevers or chills  Relevant historical information: Diabetes A1c less than 8   Exam:  BP (!) 142/68   Pulse 71   Ht 5' 3.5" (1.613 m)   Wt 117 lb (53.1 kg)   SpO2 97%   BMI 20.40 kg/m  General: Well Developed, well nourished, and in no acute distress.   MSK: Left hand normal-appearing Triggering present with flexion of second PIP joint.  Tender to palpation at second palmar MCP.  Right hand normal-appearing Tender palpation at third MCP with flexion of PIP joint.  Triggering present with flexion.    Lab and Radiology Results  Procedure: Real-time Ultrasound Guided Injection tendon sheath at left second A1 pulley (trigger finger injection index finger) Device: Philips Affiniti 50G/GE Logiq Images permanently stored and available for review in PACS Verbal informed consent obtained.  Discussed risks and benefits of procedure. Warned about infection, bleeding, hyperglycemia damage to structures among others. Patient expresses understanding and agreement Time-out conducted.   Noted no overlying erythema, induration, or other signs of local infection.   Skin prepped in a sterile fashion.   Local anesthesia: Topical Ethyl chloride.   With sterile technique and under real time ultrasound guidance: 40 mg of Kenalog and 1 mL of lidocaine injected into tendon sheath. Fluid seen entering the tendon  sheath.   Completed without difficulty   Pain immediately resolved suggesting accurate placement of the medication.   Advised to call if fevers/chills, erythema, induration, drainage, or persistent bleeding.   Images permanently stored and available for review in the ultrasound unit.  Impression: Technically successful ultrasound guided injection.        Assessment and Plan: 67 y.o. female with trigger finger bilaterally left index finger and right third digit.  Left is worse than right today.  Plan on trigger finger injection left side today.  I want to avoid bilateral injections given her diabetes.  She is at significant risk for hyperglycemia.  Plan for steroid injection today and recheck in a week.  She has significant needle phobia and I have prescribed lorazepam in preparation for next week's injection.***   PDMP reviewed during this encounter. Orders Placed This Encounter  Procedures   [place] LIMITED JOINT SPACE STRUCTURES UP LEFT(NO LINKED CHARGES)    Order Specific Question:   Reason for Exam (SYMPTOM  OR DIAGNOSIS REQUIRED)    Answer:   left finger pain    Order Specific Question:   Preferred imaging location?    Answer:   Adult nurse [facility] ordered this encounter  Medications   LORazepam (ATIVAN) 0.5 MG tablet    Sig: 1-2 tabs 30 - 60 min prior to MRI. Do not drive with this medicine.    Dispense:  4 tablet    Refill:  0     Discussed warning signs or symptoms. Please see discharge  instructions. Patient expresses understanding.   ***

## 2023-06-11 NOTE — Patient Instructions (Addendum)
Thank you for coming in today.  You received an injection today. Seek immediate medical attention if the joint becomes red, extremely painful, or is oozing fluid.  Consider scheduling next week for the injection in the other hand.  Take the lorazepam 1 hour prior to that visit.  Do not drive on this medicine.

## 2023-06-12 ENCOUNTER — Ambulatory Visit: Payer: Self-pay

## 2023-06-12 NOTE — Patient Instructions (Signed)
Visit Information  Thank you for taking time to visit with me today. Please don't hesitate to contact me if I can be of assistance to you.   Following are the goals we discussed today:  Continue to take medications as prescribed. Continue to attend provider visits as scheduled Continue to eat healthy, lean meats, vegetables, fruits, avoid saturated and transfats Contact provider with health questions or concerns as needed Continue to check blood sugar as recommended and notify provider if questions or concerns Continue to exercise/walk daily to help with management of blood sugar and overall health.  If you are experiencing a Mental Health or Behavioral Health Crisis or need someone to talk to, please call the Suicide and Crisis Lifeline: 988 call the [place] National Suicide Prevention Lifeline: 845-545-8276 or TTY: (646)507-1779 TTY (351)024-2288) to talk to a trained counselor  Kathyrn Sheriff, RN, MSN, BSN, CCM Care Management Coordinator [phone]

## 2023-06-12 NOTE — Patient Outreach (Signed)
  Care Coordination   Follow Up Visit Note   06/12/2023 Name: Leslie Lane MRN: [id number] DOB: 1955/10/05  Leslie Lane is a 67 y.o. year old female who sees Leslie Broker, MD for primary care. I spoke with  Leslie Lane by phone today.  What matters to the patients health and wellness today?  Leslie Lane states receiving injection on yesterday for steroid injection to index finger of left hand. She states she continues to monitor blood sugar and take medications as recommended by provider. Patient with an upcoming annual wellness visit on 06/15/23. She states she has received educational material and found it to be helpful. She denies any questions. She denies any additional educational needs or care management needs. Patient to contact RNCM if care management needs in the future.    Goals Addressed             This Visit's Progress    COMPLETED: Health Management       Interventions Today    Flowsheet Row Most Recent Value  Chronic Disease   Chronic disease during today's visit Diabetes  General Interventions   General Interventions Discussed/Reviewed General Interventions Reviewed, Doctor Visits  [Evaluation of current treatment plan for health condition and patient's adherence to plan. reiterated care management program. advised to contact RNCM if case management needs in the future.]  Doctor Visits Discussed/Reviewed Doctor Visits Reviewed, PCP, Specialist  PCP/Specialist Visits Compliance with follow-up visit  [reviewed upcoming follow up appointments with patient]  Education Interventions   Education Provided --  [confirmed with patient that she has received and reviewed educational material sent]  Provided Verbal Education On Blood Sugar Monitoring, Labs, Exercise, Medication, When to see the doctor  [reviewed A1C level with patient, discussed how exercise and walking can improve blood sugar control, advised to contact provider with any health questions or concerns  as needed]  Labs Reviewed Hgb A1c  Nutrition Interventions   Nutrition Discussed/Reviewed Nutrition Reviewed, Portion sizes  [encouraged increase vegetables and monitor portion sizes]  Pharmacy Interventions   Pharmacy Dicussed/Reviewed Pharmacy Topics Reviewed  [medications reviewed-no medication assistance or medication managments needs identified at this time.]            SDOH assessments and interventions completed:  No  Care Coordination Interventions:  Yes, provided   Follow up plan: No further intervention required.   Encounter Outcome:  Patient Visit Completed   Kathyrn Sheriff, RN, MSN, BSN, CCM Care Management Coordinator [phone]

## 2023-06-18 ENCOUNTER — Encounter: Payer: Self-pay | Admitting: Internal Medicine

## 2023-06-18 ENCOUNTER — Ambulatory Visit: Payer: Medicare Other | Attending: Internal Medicine | Admitting: Internal Medicine

## 2023-06-18 VITALS — BP 130/60 | HR 82 | Resp 17 | Ht 63.0 in | Wt 109.0 lb

## 2023-06-18 DIAGNOSIS — E782 Mixed hyperlipidemia: Secondary | ICD-10-CM

## 2023-06-18 DIAGNOSIS — I700 Atherosclerosis of aorta: Secondary | ICD-10-CM

## 2023-06-18 DIAGNOSIS — E1069 Type 1 diabetes mellitus with other specified complication: Secondary | ICD-10-CM

## 2023-06-18 NOTE — Progress Notes (Signed)
Cardiology Office Note:    Date:  06/18/2023   ID:  Leslie Lane, DOB 03-31-1956, MRN [id number]  PCP:  Myrlene Broker, MD   [facility] Providers Cardiologist:  None     Referring MD: Myrlene Broker, *   Chief Complaint  Patient presents with   Aortic atherosclerosis    Follow-up    6 month  Elevated CAC, CP  History of Present Illness:    Leslie Lane is a 67 y.o. female with a hx of GERD,  76 pack hx of smoking, referral for CP and notable severe CAC with LM dx. She notes some L chest pain over a month ago. She walks and she enjoys this. She denies chest pressure with activity. No SOB. She is still smoking. She is under a lot of stress.  No prior cardiac work up. No syncope. No CHF symptoms  Interim hx 06/18/2023 She is here for a FU visit today. She notes her blood glucose has been high. She had a cortisone shot which contributes. She denies chest pain. She still smokes. Notes she smokes 2/2 anxiety. She was on xanax in the past, but she was too fatigued with it.  She's tried wellbutrin, it did not help.   Current Medications: Current Outpatient Medications on File Prior to Visit  Medication Sig Dispense Refill   Accu-Chek Softclix Lancets lancets Use as instructed to check blood sugar 4 times per day dx code E10.65 400 each 1   Alcohol Swabs (B-D SINGLE USE SWABS REGULAR) PADS USE FOUR TIMES DAILY 400 each 2   aspirin EC 81 MG tablet Take 81 mg by mouth every morning.     B-D ULTRAFINE III SHORT PEN 31G X 8 MM MISC USE TO INJECT WITH PRE-FILLED PEN SYRINGE SUB-Q DAILY 400 each 2   Blood Glucose Calibration (ACCU-CHEK AVIVA) SOLN USE AS DIRECTED 1 each 1   Blood Glucose Monitoring Suppl (ACCU-CHEK AVIVA PLUS) w/Device KIT USE AS DIRECTED 1 kit 0   cetirizine (ZYRTEC) 10 MG tablet Take 1 tablet (10 mg total) by mouth daily. 90 tablet 3   Clobetasol Propionate 0.05 % shampoo Apply topically.     Continuous Blood Gluc Receiver (FREESTYLE LIBRE 3  READER) DEVI 1 Device by Does not apply route continuous. 1 each 0   Continuous Blood Gluc Sensor (FREESTYLE LIBRE 2 SENSOR) MISC Use to check blood sugar daily 2 each 12   Continuous Blood Gluc Sensor (FREESTYLE LIBRE 3 SENSOR) MISC Place 1 sensor on the skin every 14 days. Use to check glucose continuously 6 each 3   cyclobenzaprine (FLEXERIL) 5 MG tablet Take 1 tablet (5 mg total) by mouth 3 (three) times daily as needed for muscle spasms. 30 tablet 1   dapagliflozin propanediol (FARXIGA) 10 MG TABS tablet Take 1 tablet (10 mg total) by mouth daily. 30 tablet 11   donepezil (ARICEPT) 10 MG tablet TAKE 1 TABLET BY MOUTH AT NIGHT 60 tablet 5   fexofenadine (ALLEGRA) 180 MG tablet Take 180 mg by mouth daily.     gabapentin (NEURONTIN) 300 MG capsule TAKE 1 CAPSULE(300 MG) BY MOUTH THREE TIMES DAILY 90 capsule 5   glucose blood (ACCU-CHEK AVIVA PLUS) test strip CHECK BLOOD SUGAR 4 TIMES DAILY 400 strip 3   GVOKE HYPOPEN 2-PACK 1 MG/0.2ML SOAJ INJECT 1 MG UNDER THE SKIN AS NEEDED FOR LOW BLOOD SUGAR 0.4 mL 2   hyoscyamine (LEVBID) 0.375 MG 12 hr tablet Take 1 tablet (0.375 mg total) by  mouth 2 (two) times daily. 60 tablet 11   insulin detemir (LEVEMIR FLEXTOUCH) 100 UNIT/ML FlexPen Inject 4 units in the morning and 9 units at bedtime 15 mL 5   insulin lispro (HUMALOG KWIKPEN) 100 UNIT/ML KwikPen INJECT SUBCUTANEOUSLY 2-5  UNITS 3 TIMES DAILY AS INSTRUCTED MAXIMUM 20 UNITS A DAY. 30 mL 4   Insulin Pen Needle 32G X 4 MM MISC Use inject insulin tid 300 each 2   Insulin Syringe-Needle U-100 (B-D INSULIN SYRINGE) 31G X 5/16" 0.3 ML MISC Use to administer insulin 10 each 2   lamoTRIgine (LAMICTAL) 100 MG tablet Take 1 tablet (100 mg total) by mouth 2 (two) times daily. 180 tablet 0   lipase/protease/amylase (CREON) 36000 UNITS CPEP capsule Take 2 capsules by mouth three times a day with meals and 1 capsule by mouth twice daily with snacks 240 capsule 11   LORazepam (ATIVAN) 0.5 MG tablet 1-2 tabs 30 - 60  min prior to MRI. Do not drive with this medicine. 4 tablet 0   lovastatin (MEVACOR) 40 MG tablet TAKE 1 TABLET BY MOUTH AT  BEDTIME 100 tablet 2   Magnesium 500 MG TABS Take 1 tablet by mouth daily.     metoprolol succinate (TOPROL XL) 25 MG 24 hr tablet Take 1 tablet (25 mg total) by mouth daily. 90 tablet 3   Multiple Vitamin (MULTIVITAMIN) tablet Take 1 tablet by mouth daily. Reported on 11/22/2015     Omega-3 Fatty Acids (FISH OIL) 1000 MG CAPS Take 1 capsule by mouth daily.     omeprazole (PRILOSEC) 40 MG capsule TAKE 1 CAPSULE(40 MG) BY MOUTH IN THE MORNING AND AT BEDTIME 180 capsule 0   traZODone (DESYREL) 100 MG tablet Take 2 tablets (200 mg total) by mouth at bedtime. 180 tablet 0   triamcinolone cream (KENALOG) 0.1 %      umeclidinium-vilanterol (ANORO ELLIPTA) 62.5-25 MCG/ACT AEPB INHALE 1 PUFF INTO THE LUNGS DAILY 60 each 5   vitamin E 1000 UNIT capsule Take 1,000 Units by mouth daily.     No current facility-administered medications on file prior to visit.       Allergies:   Penicillins and Sulfonamide derivatives     Family History: The patient's family history includes Arthritis in her father and mother; Bladder Cancer in her sister; Celiac disease in her sister; Colon cancer (age of onset: 43) in her sister; Diabetes in her niece; Diverticulosis in her mother; Heart attack in her brother; Hyperlipidemia in her mother and another family member; Kidney cancer in her father and paternal aunt; Kidney disease in her father and mother. There is no history of Anxiety disorder, Bipolar disorder, Depression, Breast cancer, Esophageal cancer, Stomach cancer, or Rectal cancer.  ROS:   Please see the history of present illness.     All other systems reviewed and are negative.  EKGs/Labs/Other Studies Reviewed:    The following studies were reviewed today:   EKG:  EKG is  ordered today.  The ekg ordered today demonstrates   12/15/2022: NSR   EKG  Interpretation Date/Time:  Monday June 18 2023 10:59:20 EST Ventricular Rate:  67 PR Interval:  176 QRS Duration:  82 QT Interval:  398 QTC Calculation: 420 R Axis:   16  Text Interpretation: Normal sinus rhythm Normal ECG When compared with ECG of 02-May-2015 23:04, PREVIOUS ECG IS PRESENT Confirmed by Carolan Clines ([id number]) on 06/18/2023 4:12:55 PM    TTE 01/08/2023  1. Left ventricular ejection fraction, by estimation, is 55 to  60%. The left ventricle has normal function. The left ventricle has no regional wall motion abnormalities. There is mild left ventricular hypertrophy. Left ventricular diastolic parameters  are consistent with Grade I diastolic dysfunction (impaired relaxation). Elevated left atrial pressure. The average left ventricular global longitudinal strain is -21.5 %. The global longitudinal strain is normal.   2. Right ventricular systolic function is normal. The right ventricular size is normal. There is normal pulmonary artery systolic pressure.   3. Left atrial size was moderately dilated.   4. The mitral valve is normal in structure. No evidence of mitral valve  regurgitation. Moderate mitral stenosis. The mean mitral valve gradient is 7.0 mmHg. Severe mitral annular calcification.   5. The aortic valve is tricuspid. Aortic valve regurgitation is mild. No aortic stenosis is present.   6. There is Moderate (Grade III) atheroma plaque involving the ascending aorta.   7. The inferior vena cava is normal in size with greater than 50%  respiratory variability, suggesting right atrial pressure of 3 mmHg.    Recent Labs: 11/10/2022: ALT 17 12/24/2022: Hemoglobin 10.2; Platelets 303 01/24/2023: BUN 29; Creatinine, Ser 0.84; Potassium 4.5; Sodium 140   Recent Lipid Panel    Component Value Date/Time   CHOL 170 11/10/2022 1405   TRIG 67.0 11/10/2022 1405   HDL 83.70 11/10/2022 1405   CHOLHDL 2 11/10/2022 1405   VLDL 13.4 11/10/2022 1405   LDLCALC 73 11/10/2022 1405    LDLDIRECT 101.7 03/26/2012 1048     Risk Assessment/Calculations:     Physical Exam:    VS:   Vitals:   06/18/23 1054  BP: 130/60  Pulse: 82  Resp: 17  SpO2: 98%      Wt Readings from Last 3 Encounters:  06/18/23 109 lb (49.4 kg)  06/11/23 117 lb (53.1 kg)  05/03/23 114 lb 3.2 oz (51.8 kg)     GEN:  Well nourished, well developed in no acute distress HEENT: Normal NECK: No JVD; No carotid bruits LYMPHATICS: No lymphadenopathy CARDIAC: RRR, II/VI, rubs, gallops RESPIRATORY:  Clear to auscultation without rales, wheezing or rhonchi  ABDOMEN: Soft, non-tender, non-distended MUSCULOSKELETAL:  No edema; No deformity  SKIN: Warm and dry NEUROLOGIC:  Alert and oriented x 3 PSYCHIATRIC:  Normal affect   ASSESSMENT and PLAN   Elevated CAC Echo in July 2024 showed normal LV function.  She has significant atheroma and I discussed this likely related to smoking. -Continue metoprolol 25 mg XL daily -on lovastatin 40 mg daily; fasting lipids today; LDL goal < 70 mg/dL -Continue aspirin 81 mg daily   Tobacco abuse Smoking cessation counseling Tried wellbutrin; tried medications in the past for anxiety Nicotine patches doesn't help   DM2: A1c goal < 7.  A1c 7.6%. She uses borix acid. Has had a yeast infection. No recent infections, will stop farxiga if she has a recurrence. Continue Farxiga 10 mg daily  Dispo  Fasting lipids Follow up 6 months      Medication Adjustments/Labs and Tests Ordered: Current medicines are reviewed at length with the patient today.  Concerns regarding medicines are outlined above.  Orders Placed This Encounter  Procedures   Lipid panel   EKG 12-Lead   No orders of the defined types were placed in this encounter.   Patient Instructions  Medication Instructions:  No changes  *If you need a refill on your cardiac medications before your next appointment, please call your pharmacy*   Lab Work: lipids If you have labs (blood work)  drawn  today and your tests are completely normal, you will receive your results only by: MyChart Message (if you have MyChart) OR A paper copy in the mail If you have any lab test that is abnormal or we need to change your treatment, we will call you to review the results.   Follow-Up: At [facility], you and your health needs are our priority.  As part of our continuing mission to provide you with exceptional heart care, we have created designated Provider Care Teams.  These Care Teams include your primary Cardiologist (physician) and Advanced Practice Providers (APPs -  Physician Assistants and Nurse Practitioners) who all work together to provide you with the care you need, when you need it.  We recommend signing up for the patient portal called "MyChart".  Sign up information is provided on this After Visit Summary.  MyChart is used to connect with patients for Virtual Visits (Telemedicine).  Patients are able to view lab/test results, encounter notes, upcoming appointments, etc.  Non-urgent messages can be sent to your provider as well.   To learn more about what you can do with MyChart, go to [email or web address].    Your next appointment:   6 months  Provider:   Carolan Clines          Signed, Maisie Fus, MD  06/18/2023 4:13 PM    [facility]

## 2023-06-18 NOTE — Patient Instructions (Addendum)
Medication Instructions:  No changes  *If you need a refill on your cardiac medications before your next appointment, please call your pharmacy*   Lab Work: lipids If you have labs (blood work) drawn today and your tests are completely normal, you will receive your results only by: MyChart Message (if you have MyChart) OR A paper copy in the mail If you have any lab test that is abnormal or we need to change your treatment, we will call you to review the results.   Follow-Up: At [facility], you and your health needs are our priority.  As part of our continuing mission to provide you with exceptional heart care, we have created designated Provider Care Teams.  These Care Teams include your primary Cardiologist (physician) and Advanced Practice Providers (APPs -  Physician Assistants and Nurse Practitioners) who all work together to provide you with the care you need, when you need it.  We recommend signing up for the patient portal called "MyChart".  Sign up information is provided on this After Visit Summary.  MyChart is used to connect with patients for Virtual Visits (Telemedicine).  Patients are able to view lab/test results, encounter notes, upcoming appointments, etc.  Non-urgent messages can be sent to your provider as well.   To learn more about what you can do with MyChart, go to [email or web address].    Your next appointment:   6 months  Provider:   [facility]

## 2023-06-20 ENCOUNTER — Other Ambulatory Visit: Payer: Self-pay

## 2023-06-20 ENCOUNTER — Ambulatory Visit: Payer: Medicare Other | Admitting: Family Medicine

## 2023-06-20 VITALS — BP 142/68 | HR 71 | Ht 63.0 in | Wt 117.0 lb

## 2023-06-20 DIAGNOSIS — M79641 Pain in right hand: Secondary | ICD-10-CM | POA: Diagnosis not present

## 2023-06-20 DIAGNOSIS — M65331 Trigger finger, right middle finger: Secondary | ICD-10-CM

## 2023-06-20 DIAGNOSIS — M79642 Pain in left hand: Secondary | ICD-10-CM

## 2023-06-20 DIAGNOSIS — E108 Type 1 diabetes mellitus with unspecified complications: Secondary | ICD-10-CM

## 2023-06-20 NOTE — Patient Instructions (Addendum)
Thank you for coming in today.   You received an injection today. Seek immediate medical attention if the joint becomes red, extremely painful, or is oozing fluid.   Recheck as needed.

## 2023-06-20 NOTE — Progress Notes (Signed)
   Rubin Payor, PhD, LAT, ATC acting as a scribe for Clementeen Graham, MD.  Leslie Lane is a 67 y.o. female who presents to [facility] today for f/u bilat finger pain. Pt was last seen by Dr. Denyse Amass on 06/11/23 and was given a L 2nd trigger finger steroid injection and was advised to return in 1 wk to inject the contralateral side. Lorazepam prescribed.  Today, pt reports she is super nervous today and didn't sleep at all last night. L hand is feeling pretty good since the steroid injection. She is wanting to proceed w/ the R hand today.   Dx imaging: 04/16/20 4th finger XR   Pertinent review of systems: No fevers or chills  Relevant historical information: Diabetes   Exam:  BP (!) 142/68   Pulse 71   Ht 5\' 3"  (1.6 m)   Wt 117 lb (53.1 kg)   SpO2 97%   BMI 20.73 kg/m  General: Well Developed, well nourished, and in no acute distress.   MSK: Right hand normal-appearing tender palpation third palmar MCP tendon sheath at A1 pulley.  Pain with finger flexion.    Lab and Radiology Results  Procedure: Real-time Ultrasound Guided Injection of right hand third MCP A1 pulley tendon sheath (trigger finger injection) Device: Philips Affiniti 50G/GE Logiq Images permanently stored and available for review in PACS Verbal informed consent obtained.  Discussed risks and benefits of procedure. Warned about infection, bleeding, hyperglycemia damage to structures among others. Patient expresses understanding and agreement Time-out conducted.   Noted no overlying erythema, induration, or other signs of local infection.   Skin prepped in a sterile fashion.   Local anesthesia: Topical Ethyl chloride.   With sterile technique and under real time ultrasound guidance: 40 mg of Kenalog and 1 mL of lidocaine injected into tendon sheath at A1 pulley third MCP. Fluid seen entering the tendon sheath.   Completed without difficulty   Pain immediately resolved suggesting  accurate placement of the medication.   Advised to call if fevers/chills, erythema, induration, drainage, or persistent bleeding.   Images permanently stored and available for review in the ultrasound unit.  Impression: Technically successful ultrasound guided injection.        Assessment and Plan: 67 y.o. female with right trigger finger.  Plan on injection today.  Warned about hyperglycemia given diabetes.  Check back as needed.   PDMP not reviewed this encounter. Orders Placed This Encounter  Procedures   [place] LIMITED JOINT SPACE STRUCTURES UP BILAT(NO LINKED CHARGES)    Reason for Exam (SYMPTOM  OR DIAGNOSIS REQUIRED):   bilateral hand pain    Preferred imaging location?:   [facility]   No orders of the defined types were placed in this encounter.    Discussed warning signs or symptoms. Please see discharge instructions. Patient expresses understanding.   The above documentation has been reviewed and is accurate and complete Clementeen Graham, M.D.

## 2023-06-21 ENCOUNTER — Telehealth: Payer: Self-pay

## 2023-06-21 ENCOUNTER — Encounter: Payer: Self-pay | Admitting: Internal Medicine

## 2023-06-21 ENCOUNTER — Ambulatory Visit: Payer: Medicare Other | Admitting: Internal Medicine

## 2023-06-21 VITALS — BP 140/80 | HR 119 | Temp 98.0°F | Ht 63.0 in | Wt 110.0 lb

## 2023-06-21 DIAGNOSIS — E108 Type 1 diabetes mellitus with unspecified complications: Secondary | ICD-10-CM

## 2023-06-21 DIAGNOSIS — R300 Dysuria: Secondary | ICD-10-CM

## 2023-06-21 DIAGNOSIS — Z23 Encounter for immunization: Secondary | ICD-10-CM

## 2023-06-21 LAB — URINALYSIS, ROUTINE W REFLEX MICROSCOPIC
Bilirubin Urine: NEGATIVE
Hgb urine dipstick: NEGATIVE
Ketones, ur: NEGATIVE
Leukocytes,Ua: NEGATIVE
Nitrite: NEGATIVE
RBC / HPF: NONE SEEN (ref 0–?)
Specific Gravity, Urine: 1.015 (ref 1.000–1.030)
Total Protein, Urine: NEGATIVE
Urine Glucose: >=1000 — AB
Urobilinogen, UA: 0.2 (ref 0.0–1.0)
pH: 6 (ref 5.0–8.0)

## 2023-06-21 LAB — HEMOGLOBIN A1C: Hgb A1c MFr Bld: 7.3 % — ABNORMAL HIGH (ref 4.6–6.5)

## 2023-06-21 LAB — MICROALBUMIN / CREATININE URINE RATIO
Creatinine,U: 42.7 mg/dL
Microalb Creat Ratio: 1.6 mg/g (ref 0.0–30.0)
Microalb, Ur: <0.7 mg/dL (ref 0.0–1.9)

## 2023-06-21 LAB — BASIC METABOLIC PANEL
BUN: 25 mg/dL — ABNORMAL HIGH (ref 6–23)
CO2: 30 meq/L (ref 19–32)
Calcium: 9.9 mg/dL (ref 8.4–10.5)
Chloride: 99 meq/L (ref 96–112)
Creatinine, Ser: 0.81 mg/dL (ref 0.40–1.20)
GFR: 74.2 mL/min (ref >60.00)
Glucose, Bld: 306 mg/dL — ABNORMAL HIGH (ref 70–99)
Potassium: 4.2 meq/L (ref 3.5–5.1)
Sodium: 137 meq/L (ref 135–145)

## 2023-06-21 NOTE — Progress Notes (Signed)
   Subjective:   Patient ID: Leslie Lane, female    DOB: 1956-03-15, 67 y.o.   MRN: [id number]  HPI The patient is a 67 YO female coming in for possible UTI and follow up. Recent steroid shot right hand and sugar this morning 400s and feeling not well.   Review of Systems  Constitutional: Negative.   HENT: Negative.    Eyes: Negative.   Respiratory: Negative.  Negative for cough, chest tightness and shortness of breath.   Cardiovascular: Negative.  Negative for chest pain, palpitations and leg swelling.  Gastrointestinal:  Positive for abdominal pain. Negative for abdominal distention, constipation, diarrhea, nausea and vomiting.  Genitourinary:  Positive for dysuria, frequency and urgency.  Musculoskeletal: Negative.   Skin: Negative.   Neurological: Negative.   Psychiatric/Behavioral: Negative.      Objective:  Physical Exam Constitutional:      Appearance: She is well-developed.  HENT:     Head: Normocephalic and atraumatic.  Cardiovascular:     Rate and Rhythm: Normal rate and regular rhythm.  Pulmonary:     Effort: Pulmonary effort is normal. No respiratory distress.     Breath sounds: Normal breath sounds. No wheezing or rales.  Abdominal:     General: Bowel sounds are normal. There is no distension.     Palpations: Abdomen is soft.     Tenderness: There is no abdominal tenderness. There is no rebound.  Musculoskeletal:     Cervical back: Normal range of motion.  Skin:    General: Skin is warm and dry.  Neurological:     Mental Status: She is alert and oriented to person, place, and time.     Coordination: Coordination normal.     Vitals:   06/21/23 0837 06/21/23 0844  BP: (!) 140/80 (!) 140/80  Pulse: (!) 119   Temp: 98 F (36.7 C)   TempSrc: Oral   SpO2: 96%   Weight: 110 lb (49.9 kg)   Height: 5\' 3"  (1.6 m)     Assessment & Plan:  Flu shot given at visit

## 2023-06-21 NOTE — Telephone Encounter (Signed)
I have sent a message to endo

## 2023-06-21 NOTE — Patient Instructions (Signed)
We will check the urine for infection.

## 2023-06-21 NOTE — Assessment & Plan Note (Signed)
Checking U/A for infection she is having symptoms. Treat as appropriate.

## 2023-06-21 NOTE — Assessment & Plan Note (Signed)
She is having high sugars today. Checking U/A for ketones and micralbumin to creatinine ratio. She has taken extra humalog today and is drinking plenty of fluids. If this is not improving she will reach out to endo on dosing. She just had cortisone shot in right hand which triggered high sugars. Typically she is controlled on multi-insulin regimen.

## 2023-06-22 DIAGNOSIS — E782 Mixed hyperlipidemia: Secondary | ICD-10-CM | POA: Diagnosis not present

## 2023-06-22 DIAGNOSIS — E1069 Type 1 diabetes mellitus with other specified complication: Secondary | ICD-10-CM | POA: Diagnosis not present

## 2023-06-22 DIAGNOSIS — I700 Atherosclerosis of aorta: Secondary | ICD-10-CM | POA: Diagnosis not present

## 2023-06-22 LAB — LIPID PANEL
Chol/HDL Ratio: 2 {ratio} (ref 0.0–4.4)
Cholesterol, Total: 218 mg/dL — ABNORMAL HIGH (ref 100–199)
HDL: 110 mg/dL (ref >39)
LDL Chol Calc (NIH): 96 mg/dL (ref 0–99)
Triglycerides: 67 mg/dL (ref 0–149)
VLDL Cholesterol Cal: 12 mg/dL (ref 5–40)

## 2023-06-25 NOTE — Telephone Encounter (Signed)
This has been addressed.

## 2023-06-26 MED ORDER — ROSUVASTATIN CALCIUM 40 MG PO TABS: 40.0000 mg | ORAL_TABLET | Freq: Every day | ORAL | 3 refills | Status: DC

## 2023-06-26 NOTE — Addendum Note (Signed)
Addended by: Myriam Jacobson on: 06/26/2023 07:33 AM   Modules accepted: Orders

## 2023-06-29 ENCOUNTER — Telehealth: Payer: Self-pay | Admitting: Endocrinology

## 2023-06-29 NOTE — Telephone Encounter (Signed)
We can change to other basal insulin after she ran out of levemir, eg lantus or tresiba or basaglar which ever is covered by her insurance.

## 2023-06-29 NOTE — Telephone Encounter (Signed)
Patient advised she was told by her Pharmacy Levemir was being discontinued and she doesn't know what she needs to do. Please call patient [phone]

## 2023-07-13 ENCOUNTER — Telehealth (HOSPITAL_BASED_OUTPATIENT_CLINIC_OR_DEPARTMENT_OTHER): Payer: Self-pay | Admitting: Psychiatry

## 2023-07-13 ENCOUNTER — Encounter (HOSPITAL_COMMUNITY): Payer: Self-pay

## 2023-07-13 DIAGNOSIS — Z91199 Patient's noncompliance with other medical treatment and regimen due to unspecified reason: Secondary | ICD-10-CM

## 2023-07-13 NOTE — Progress Notes (Signed)
 Patient is no show today.  We will send a letter to reschedule appointment.

## 2023-07-18 DIAGNOSIS — E1065 Type 1 diabetes mellitus with hyperglycemia: Secondary | ICD-10-CM | POA: Diagnosis not present

## 2023-07-19 ENCOUNTER — Other Ambulatory Visit: Payer: Self-pay

## 2023-07-19 ENCOUNTER — Telehealth: Payer: Self-pay

## 2023-07-19 NOTE — Telephone Encounter (Signed)
Patient requesting Lantus since per prior conversation Levemir is being discontinued. Patient states she is almost out of the Levemir

## 2023-07-20 ENCOUNTER — Other Ambulatory Visit: Payer: Self-pay

## 2023-07-20 ENCOUNTER — Other Ambulatory Visit: Payer: Self-pay | Admitting: Endocrinology

## 2023-07-20 MED ORDER — INSULIN GLARGINE 100 UNIT/ML SOLOSTAR PEN: PEN_INJECTOR | SUBCUTANEOUS | 4 refills | Status: DC

## 2023-07-20 NOTE — Telephone Encounter (Signed)
Patient called to check status of Lantus, is going to be out of insulin by the weekend.  Please call patient at [phone] to acknowledge when Lantus is sent to pharmacy.

## 2023-07-26 ENCOUNTER — Other Ambulatory Visit (HOSPITAL_COMMUNITY): Payer: Self-pay

## 2023-07-26 DIAGNOSIS — G4701 Insomnia due to medical condition: Secondary | ICD-10-CM

## 2023-07-26 DIAGNOSIS — F332 Major depressive disorder, recurrent severe without psychotic features: Secondary | ICD-10-CM

## 2023-07-26 DIAGNOSIS — F4010 Social phobia, unspecified: Secondary | ICD-10-CM

## 2023-07-26 MED ORDER — LAMOTRIGINE 100 MG PO TABS: 100.0000 mg | ORAL_TABLET | Freq: Two times a day (BID) | ORAL | 0 refills | Status: DC

## 2023-07-26 MED ORDER — TRAZODONE HCL 100 MG PO TABS: 200.0000 mg | ORAL_TABLET | Freq: Every day | ORAL | 0 refills | Status: DC

## 2023-07-27 ENCOUNTER — Other Ambulatory Visit: Payer: Medicare Other

## 2023-08-01 ENCOUNTER — Ambulatory Visit: Payer: Medicare Other | Admitting: Endocrinology

## 2023-08-01 ENCOUNTER — Encounter: Payer: Self-pay | Admitting: Endocrinology

## 2023-08-01 VITALS — BP 136/80 | HR 65 | Resp 20 | Ht 63.0 in | Wt 110.0 lb

## 2023-08-01 DIAGNOSIS — E1065 Type 1 diabetes mellitus with hyperglycemia: Secondary | ICD-10-CM

## 2023-08-01 DIAGNOSIS — E160 Drug-induced hypoglycemia without coma: Secondary | ICD-10-CM

## 2023-08-01 DIAGNOSIS — T383X5A Adverse effect of insulin and oral hypoglycemic [antidiabetic] drugs, initial encounter: Secondary | ICD-10-CM | POA: Diagnosis not present

## 2023-08-01 MED ORDER — INSULIN LISPRO (1 UNIT DIAL) 100 UNIT/ML (KWIKPEN): PEN_INJECTOR | SUBCUTANEOUS | 4 refills | Status: DC

## 2023-08-01 MED ORDER — INSULIN GLARGINE 100 UNIT/ML SOLOSTAR PEN: PEN_INJECTOR | SUBCUTANEOUS | 4 refills | Status: DC

## 2023-08-01 NOTE — Patient Instructions (Addendum)
Decrease Lantus 5 units in the morning and at bedtime two times a day.   Humalog 1-3 units with meals 3 times a day, if you exercise do not take take humalog for the meal as well. No more than 3 units at a time.   Do not take humalog around bedtime for glucose around 200 or less.   Use Free style libre monitor.

## 2023-08-01 NOTE — Progress Notes (Signed)
Outpatient Endocrinology Note [place] Armas Mcbee, MD  08/01/23  Patient's Name: Leslie Lane    DOB: [birth year]    MRN: [id number]                                                    REASON OF VISIT: Follow up of type 1 diabetes mellitus  PCP: Myrlene Broker, MD  HISTORY OF PRESENT ILLNESS:   Leslie Lane is a 68 y.o. old female with past medical history listed below, is here for follow up for type 1 diabetes mellitus.   Pertinent Diabetes History: No was diagnosed with type 1 diabetes mellitus in 1967.  Chronic Diabetes Complications : Retinopathy: no. Last ophthalmology exam was done on annually, following with ophthalmology regularly.  Nephropathy: no /microalbuminuria present Peripheral neuropathy: yes, on gabapentin as needed.  Cymbalta. Coronary artery disease: no Stroke: no She has gastroparesis. Followed by gastroenterologist for abdominal pain and pancreatic insufficiency.  Relevant comorbidities and cardiovascular risk factors: Obesity: no Body mass index is 19.49 kg/m.  Hypertension: Yes  Hyperlipidemia : Yes, on statin   Current / Home Diabetic regimen includes: Lantus 5 units in the morning and 9 units at bedtime.  Humalog 2-3 units with breakfast and lunch and 2-3, sometimes 5-6 units units with supper.  Lately has been using up to 8 units due to hyperglycemia related to steroid injection into the hand.  She is on Farxiga 10 mg daily by cardiology.  Prior diabetic medications:  Glycemic data:    CONTINUOUS GLUCOSE MONITORING SYSTEM (CGMS) INTERPRETATION:                         FreeStyle Libre 3 CGM-  Sensor Download (Sensor download was reviewed and summarized below.) Dates: January 15 to July 31, 2023, 14 days.  Data on the receiver was wrong, it was March which which is corrected after download. Sensor Average: 137  Glucose Management Indicator: 6.6%  % data captured: 81    Impression: -Frequent hypoglycemia with blood sugar in 50-60  range at random time especially after meals and correctional use of Humalog.  Some of the times had hypoglycemia overnight.  Sporadic hyperglycemia with blood sugar in the range of 250-350 range related to no meal bolus and steroid injection.  17% blood sugar below 69 in last 2 weeks.  Hypoglycemia: Patient has frequent hypoglycemic episodes. Patient has hypoglycemia ? awareness, has a glucagon ER kit.  Glucose tablets.  Factors modifying glucose control: 1.  Diabetic diet assessment: likes to eat junk food.   2.  Staying active or exercising: No formal exercise.  Physically active and walks multiple times a day.  3.  Medication compliance: compliant all of the time.  Interval history  Diabetes regimen reviewed and noted as above.  Patient reports had a steroid injection into the hand and had hyperglycemia and has been using higher dose of Humalog and ultimately causing hypoglycemia.  Patient has frequent hypoglycemia has reviewed and noted above.  She has occasional numbness and tingling of the feet.  No vision problem.  No other complaints today.  REVIEW OF SYSTEMS As per history of present illness.   PAST MEDICAL HISTORY: Past Medical History:  Diagnosis Date   Abdominal pain 02/28/2016   Abnormal CT scan, bladder    Noted 07/28/13 CT -  s/p uro eval 08/2013 [place] -    Acquired trigger finger of left ring finger 02/18/2019   Injected February 18, 2019 and March 2022   Allergic rhinitis    Anemia    Arthritis    Bipolar disorder 07/05/2010   Cataract    Chronic lower back pain 07/05/2010   Chronic neck pain    Diabetic gastroparesis associated with type 1 diabetes mellitus 10/24/2016   Fibromyalgia    Generalized anxiety disorder 04/27/2014   GERD (gastroesophageal reflux disease) 07/05/2010   Hepatitis B virus infection 07/05/2010   History of substance abuse    opiates; cocaine   Hypoglycemia unawareness associated with type 1 diabetes mellitus 11/23/2020   Insomnia  04/13/2015   Major depressive disorder 07/28/2014   Migraine headache 07/05/2010   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  It hasn't happened in a long time."   Smokers' cough 07/05/2010   Tubular adenoma of colon 2018   Type 1 diabetes mellitus with complication 02/06/2013   WBC decreased 03/27/2017    PAST SURGICAL HISTORY: Past Surgical History:  Procedure Laterality Date   CESAREAN SECTION     COLONOSCOPY     TUBAL LIGATION     UPPER GASTROINTESTINAL ENDOSCOPY      ALLERGIES: Allergies  Allergen Reactions   Penicillins Anaphylaxis   Sulfonamide Derivatives Anaphylaxis    FAMILY HISTORY:  Family History  Problem Relation Age of Onset   Arthritis Mother    Diverticulosis Mother    Kidney disease Mother    Hyperlipidemia Mother    Arthritis Father    Kidney disease Father    Kidney cancer Father    Bladder Cancer Sister    Colon cancer Sister 32   Celiac disease Sister    Heart attack Brother    Kidney cancer Paternal Aunt    Diabetes Niece    Hyperlipidemia Other    Anxiety disorder Neg Hx    Bipolar disorder Neg Hx    Depression Neg Hx    Breast cancer Neg Hx    Esophageal cancer Neg Hx    Stomach cancer Neg Hx    Rectal cancer Neg Hx     SOCIAL HISTORY: Social History   Socioeconomic History   Marital status: Divorced    Spouse name: Not on file   Number of children: 1   Years of education: 12   Highest education level: High school graduate  Occupational History   Occupation: Disability  Tobacco Use   Smoking status: Every Day    Current packs/day: 1.50    Average packs/day: 1.5 packs/day for 50.0 years (75.0 ttl pk-yrs)    Types: Cigarettes   Smokeless tobacco: Never    Tobacco comments:    Reports she has cut back to a cartoon that now lasts 2 weeks.   Vaping Use   Vaping status: Former  Substance and Sexual Activity   Alcohol use: Yes    Alcohol/week: 7.0 standard drinks of alcohol    Types: 7 Cans of beer per week    Comment: 1 beer nightly   Drug use: Not Currently    Types: Cocaine    Comment: past opiate and cocaine abuse   Sexual activity: Not Currently  Birth control/protection: Surgical, Post-menopausal  Other Topics Concern   Not on file  Social History Narrative   Divorced, disabled   Lives with parents   Right handed    Retired   Caffeine prn   Social Drivers of Health   Financial Resource Strain: Low Risk  (03/14/2022)   [place] Financial Resource Strain (CARDIA)    Difficulty of Paying Living Expenses: Not hard at all  Food Insecurity: No Food Insecurity (05/23/2023)   Hunger Vital Sign    Worried About Running Out of Food in the Last Year: Never true    Ran Out of Food in the Last Year: Never true  Transportation Needs: No Transportation Needs (05/23/2023)   PRAPARE - Administrator, Civil Service (Medical): No    Lack of Transportation (Non-Medical): No  Physical Activity: Sufficiently Active (03/14/2022)   Exercise Vital Sign    Days of Exercise per Week: 5 days    Minutes of Exercise per Session: 30 min  Stress: Stress Concern Present (03/14/2022)   [employer] of Occupational Health - Occupational Stress Questionnaire    Feeling of Stress : To some extent  Social Connections: Socially Integrated (03/14/2022)   Social Connection and Isolation Panel [NHANES]    Frequency of Communication with Friends and Family: More than three times a week    Frequency of Social Gatherings with Friends and Family: More than three times a week    Attends Religious Services: More than 4 times per year    Active Member of [employer] or Organizations: Yes    Attends Engineer, structural: More than 4 times per year     Marital Status: Married    MEDICATIONS:  Current Outpatient Medications  Medication Sig Dispense Refill   Accu-Chek Softclix Lancets lancets Use as instructed to check blood sugar 4 times per day dx code E10.65 400 each 1   Alcohol Swabs (B-D SINGLE USE SWABS REGULAR) PADS USE FOUR TIMES DAILY 400 each 2   aspirin EC 81 MG tablet Take 81 mg by mouth every morning.     B-D ULTRAFINE III SHORT PEN 31G X 8 MM MISC USE TO INJECT WITH PRE-FILLED PEN SYRINGE SUB-Q DAILY 400 each 2   Blood Glucose Calibration (ACCU-CHEK AVIVA) SOLN USE AS DIRECTED 1 each 1   Blood Glucose Monitoring Suppl (ACCU-CHEK AVIVA PLUS) w/Device KIT USE AS DIRECTED 1 kit 0   cetirizine (ZYRTEC) 10 MG tablet Take 1 tablet (10 mg total) by mouth daily. 90 tablet 3   Clobetasol Propionate 0.05 % shampoo Apply topically.     Continuous Blood Gluc Receiver (FREESTYLE LIBRE 3 READER) DEVI 1 Device by Does not apply route continuous. 1 each 0   Continuous Blood Gluc Sensor (FREESTYLE LIBRE 2 SENSOR) MISC Use to check blood sugar daily 2 each 12   Continuous Blood Gluc Sensor (FREESTYLE LIBRE 3 SENSOR) MISC Place 1 sensor on the skin every 14 days. Use to check glucose continuously 6 each 3   cyclobenzaprine (FLEXERIL) 5 MG tablet Take 1 tablet (5 mg total) by mouth 3 (three) times daily as needed for muscle spasms. 30 tablet 1   dapagliflozin propanediol (FARXIGA) 10 MG TABS tablet Take 1 tablet (10 mg total) by mouth daily. 30 tablet 11   donepezil (ARICEPT) 10 MG tablet TAKE 1 TABLET BY MOUTH AT NIGHT 60 tablet 5   fexofenadine (ALLEGRA) 180 MG tablet Take 180 mg by mouth daily.     gabapentin (NEURONTIN) 300 MG capsule TAKE  1 CAPSULE(300 MG) BY MOUTH THREE TIMES DAILY 90 capsule 5   glucose blood (ACCU-CHEK AVIVA PLUS) test strip CHECK BLOOD SUGAR 4 TIMES DAILY 400 strip 3   GVOKE HYPOPEN 2-PACK 1 MG/0.2ML SOAJ INJECT 1 MG UNDER THE SKIN AS NEEDED FOR LOW BLOOD SUGAR 0.4 mL 2   hyoscyamine (LEVBID) 0.375 MG 12 hr tablet Take  1 tablet (0.375 mg total) by mouth 2 (two) times daily. 60 tablet 11   insulin detemir (LEVEMIR FLEXTOUCH) 100 UNIT/ML FlexPen Inject 4 units in the morning and 9 units at bedtime 15 mL 5   Insulin Pen Needle 32G X 4 MM MISC Use inject insulin tid 300 each 2   Insulin Syringe-Needle U-100 (B-D INSULIN SYRINGE) 31G X 5/16" 0.3 ML MISC Use to administer insulin 10 each 2   lamoTRIgine (LAMICTAL) 100 MG tablet Take 1 tablet (100 mg total) by mouth 2 (two) times daily. Bridge to patient appt 2/10 30 tablet 0   lipase/protease/amylase (CREON) 36000 UNITS CPEP capsule Take 2 capsules by mouth three times a day with meals and 1 capsule by mouth twice daily with snacks 240 capsule 11   LORazepam (ATIVAN) 0.5 MG tablet 1-2 tabs 30 - 60 min prior to MRI. Do not drive with this medicine. 4 tablet 0   Magnesium 500 MG TABS Take 1 tablet by mouth daily.     metoprolol succinate (TOPROL XL) 25 MG 24 hr tablet Take 1 tablet (25 mg total) by mouth daily. 90 tablet 3   Multiple Vitamin (MULTIVITAMIN) tablet Take 1 tablet by mouth daily. Reported on 11/22/2015     Omega-3 Fatty Acids (FISH OIL) 1000 MG CAPS Take 1 capsule by mouth daily.     omeprazole (PRILOSEC) 40 MG capsule TAKE 1 CAPSULE(40 MG) BY MOUTH IN THE MORNING AND AT BEDTIME 180 capsule 0   rosuvastatin (CRESTOR) 40 MG tablet Take 1 tablet (40 mg total) by mouth daily. 90 tablet 3   traZODone (DESYREL) 100 MG tablet Take 2 tablets (200 mg total) by mouth at bedtime. Bridge to patient appt 2/10 30 tablet 0   triamcinolone cream (KENALOG) 0.1 %      umeclidinium-vilanterol (ANORO ELLIPTA) 62.5-25 MCG/ACT AEPB INHALE 1 PUFF INTO THE LUNGS DAILY 60 each 5   vitamin E 1000 UNIT capsule Take 1,000 Units by mouth daily.     insulin glargine (LANTUS) 100 UNIT/ML Solostar Pen Take 5 units in the morning and 5 units at bedtime. 15 mL 4   insulin lispro (HUMALOG KWIKPEN) 100 UNIT/ML KwikPen INJECT SUBCUTANEOUSLY 2-5  UNITS 3 TIMES DAILY AS INSTRUCTED MAXIMUM 20  UNITS A DAY. 30 mL 4   No current facility-administered medications for this visit.    PHYSICAL EXAM: Vitals:   08/01/23 1025 08/01/23 1026  BP: (!) 142/60 136/80  Pulse: 65   Resp: 20   SpO2: 95%   Weight: 110 lb (49.9 kg)   Height: 5\' 3"  (1.6 m)     Body mass index is 19.49 kg/m.  Wt Readings from Last 3 Encounters:  08/01/23 110 lb (49.9 kg)  06/21/23 110 lb (49.9 kg)  06/20/23 117 lb (53.1 kg)    General: Well developed, well nourished female in no apparent distress.  HEENT: AT/[place], no external lesions.  Eyes: Conjunctiva clear and no icterus. Neck: Neck supple  Lungs: Respirations not labored Neurologic: Alert, oriented, normal speech Extremities / Skin: Dry.   Psychiatric: Does not appear depressed or anxious  Diabetic Foot Exam - Simple   Simple  Foot Form Diabetic Foot exam was performed with the following findings: Yes 08/01/2023 10:56 AM  Visual Inspection Sensation Testing Intact to touch and monofilament testing bilaterally: Yes Pulse Check Posterior Tibialis and Dorsalis pulse intact bilaterally: Yes Comments    LABS Reviewed Lab Results  Component Value Date   HGBA1C 7.3 (H) 06/21/2023   HGBA1C 7.6 (H) 04/26/2023   HGBA1C 6.8 (H) 01/24/2023   Lab Results  Component Value Date   FRUCTOSAMINE 325 (H) 02/28/2021   FRUCTOSAMINE 322 (H) 10/11/2017   FRUCTOSAMINE 296 (H) 01/11/2016   Lab Results  Component Value Date   CHOL 218 (H) 06/22/2023   HDL 110 06/22/2023   LDLCALC 96 06/22/2023   LDLDIRECT 101.7 03/26/2012   TRIG 67 06/22/2023   CHOLHDL 2.0 06/22/2023   Lab Results  Component Value Date   MICRALBCREAT 1.6 06/21/2023   MICRALBCREAT 0.8 07/04/2022   Lab Results  Component Value Date   CREATININE 0.81 06/21/2023   Lab Results  Component Value Date   GFR 74.20 06/21/2023    ASSESSMENT / PLAN  1. Uncontrolled type 1 diabetes mellitus with hyperglycemia (HCC)   2. Hypoglycemia due to insulin      Diabetes Mellitus type 1,  complicated by diabetic neuropathy/gastroparesis. - Diabetic status / severity: Uncontrolled.  Frequent hypoglycemia.  Lab Results  Component Value Date   HGBA1C 7.3 (H) 06/21/2023    - Hemoglobin A1c goal : <7%  Discussed about controlling carbohydrates and limiting junk food.  She is ? not a good candidate for insulin pump as she has difficulty with Dexcom sensor and likely will have ? difficulty learning insulin pump operation.   - Medications: See below.  I) decrease Lantus to 5 units 2 times a day in the morning and at bedtime.  She is currently taking Lantus 5 units in the morning and 9 units at bedtime. II) Humalog 1-3 units with meals 3 times a day.  If you exercise do not take take humalog for the meal as well. No more than 3 units at a time.   Do not take humalog around bedtime for glucose around 200 or less.   She is on Farxiga 10 mg daily by cardiology.  - Home glucose testing: continue freestyle libre CGM check blood sugar as needed. - Discussed/ Gave Hypoglycemia treatment plan.  She uses glucose tablet and juice to correct hypoglycemia.  She has Glucagon Emergency Kit at home.  # Consult : not required at this time.   # Annual urine for microalbuminuria/ creatinine ratio, no microalbuminuria currently. Last  Lab Results  Component Value Date   MICRALBCREAT 1.6 06/21/2023    # Foot check nightly / neuropathy, continue gabapentin as needed.  # Annual dilated diabetic eye exams.   - Diet: Eat reasonable portion sizes to promote a healthy weight.  Advised to avoid frequently snacking. - Life style / activity / exercise: Discussed.  2. Blood pressure  -  BP Readings from Last 1 Encounters:  08/01/23 136/80    - Control is in target.  - No change in current plans.  3. Lipid status / Hyperlipidemia - Last  Lab Results  Component Value Date   LDLCALC 96 06/22/2023   - Continue lovastatin 40 mg daily.  Managed by PCP.  Diagnoses and all orders for this  visit:  Uncontrolled type 1 diabetes mellitus with hyperglycemia (HCC) -     insulin glargine (LANTUS) 100 UNIT/ML Solostar Pen; Take 5 units in the morning and 5 units at  bedtime. -     insulin lispro (HUMALOG KWIKPEN) 100 UNIT/ML KwikPen; INJECT SUBCUTANEOUSLY 2-5  UNITS 3 TIMES DAILY AS INSTRUCTED MAXIMUM 20 UNITS A DAY.  Hypoglycemia due to insulin     DISPOSITION Follow up in clinic in 2 months suggested.   All questions answered and patient verbalized understanding of the plan.  [place] Jrue Jarriel, MD [facility] Endocrinology [facility] [address] [place], [address] [place], [place] [address] Phone # [phone]  At least part of this note was generated using voice recognition software. Inadvertent word errors may have occurred, which were not recognized during the proofreading process.

## 2023-08-13 ENCOUNTER — Encounter (HOSPITAL_COMMUNITY): Payer: Self-pay

## 2023-08-13 ENCOUNTER — Telehealth (HOSPITAL_BASED_OUTPATIENT_CLINIC_OR_DEPARTMENT_OTHER): Payer: Self-pay | Admitting: Psychiatry

## 2023-08-13 DIAGNOSIS — Z91199 Patient's noncompliance with other medical treatment and regimen due to unspecified reason: Secondary | ICD-10-CM

## 2023-08-13 NOTE — Progress Notes (Signed)
 Patient is no-show today.  She is not on video platform and did not pick up phone call.

## 2023-08-16 ENCOUNTER — Encounter (HOSPITAL_COMMUNITY): Payer: Self-pay | Admitting: Psychiatry

## 2023-08-16 ENCOUNTER — Other Ambulatory Visit: Payer: Self-pay

## 2023-08-16 ENCOUNTER — Ambulatory Visit (HOSPITAL_COMMUNITY): Payer: Medicare Other | Admitting: Psychiatry

## 2023-08-16 VITALS — BP 164/72 | HR 67 | Ht 63.0 in | Wt 108.0 lb

## 2023-08-16 DIAGNOSIS — G4701 Insomnia due to medical condition: Secondary | ICD-10-CM | POA: Diagnosis not present

## 2023-08-16 DIAGNOSIS — F4010 Social phobia, unspecified: Secondary | ICD-10-CM | POA: Diagnosis not present

## 2023-08-16 DIAGNOSIS — F332 Major depressive disorder, recurrent severe without psychotic features: Secondary | ICD-10-CM | POA: Diagnosis not present

## 2023-08-16 MED ORDER — TRAZODONE HCL 150 MG PO TABS: 150.0000 mg | ORAL_TABLET | Freq: Every day | ORAL | 0 refills | Status: DC

## 2023-08-16 MED ORDER — VORTIOXETINE HBR 5 MG PO TABS: 5.0000 mg | ORAL_TABLET | Freq: Every day | ORAL | Status: DC

## 2023-08-16 MED ORDER — LAMOTRIGINE 100 MG PO TABS: 100.0000 mg | ORAL_TABLET | Freq: Two times a day (BID) | ORAL | 0 refills | Status: DC

## 2023-08-16 NOTE — Progress Notes (Signed)
BH MD/PA/NP OP Progress Note  Patient location; office Provider location; office  08/16/2023 3:32 PM [address]  MRN:  [id number]  Chief Complaint:  Chief Complaint  Patient presents with   Follow-up   HPI:  Patient came today for her follow-up appointment.  Patient has 2 no-shows and she reported does not like to leave the house.  She reported still struggling with anxiety, fatigue, lack of motivation and feeling groggy next day.  We have recommended to cut down the trazodone from 200 mg to 150 mg but she forgot.  She admitted irritability and frustration with her 38 year old mother who she believed had anger issues.  She does not leave the house.  She does not go outside.  She feels very nervous and anxious around people.  She is taking Lamictal to help her mood.  She has no rash, itching, tremors or shakes.  She reported Christmas was very quiet and did not do anything.  She feel very nervous and anxious around people and preferred to stay home by herself.  She admitted feeling very miserable sometime as feels sad depressed and admitted neglecting herself hygiene and does not take shower unless necessary.  She denies any feeling of hopelessness or worthlessness.  She denies any suicidal thoughts or homicidal thoughts.  She admitted sleeping too much.  She has no tremor or shakes or any EPS.  Recently she had blood work.  Cholesterol increased and BUN 25.  Hemoglobin A1c 7.3.  Sodium and potassium is normal.  She is now open to try a new medication because she feels her anxiety is getting worse.  In the past she had tried multiple medication with limited outcomes.  Patient lives with her 1 year old mother.  Her 62 year old daughter lives close by and she has 71-year-old granddaughter.  Patient is on disability for chronic health issues.  Visit Diagnosis:    ICD-10-CM   1. Social anxiety disorder  F40.10 lamoTRIgine (LAMICTAL) 100 MG tablet    vortioxetine HBr (TRINTELLIX) 5 MG TABS tablet     2. Major depressive disorder, recurrent, severe without psychotic features (HCC)  F33.2 lamoTRIgine (LAMICTAL) 100 MG tablet    traZODone (DESYREL) 150 MG tablet    vortioxetine HBr (TRINTELLIX) 5 MG TABS tablet    3. Insomnia due to medical condition  G47.01 traZODone (DESYREL) 150 MG tablet    vortioxetine HBr (TRINTELLIX) 5 MG TABS tablet       Past Psychiatric History: Reviewed H/O cutting arm at young age.  H/O overdose on diabetes medication at age 38.  H/O drinking and saw Mental health provider. Took Zoloft, Wellbutrin, BuSpar, Paxil and Lexapro.  No history of psychosis.  H/O social anxiety.  Saw Dr. Michae Kava in the past.  Past Medical History:  Past Medical History:  Diagnosis Date   Abdominal pain 02/28/2016   Abnormal CT scan, bladder    Noted 07/28/13 CT - s/p uro eval 08/2013 [place] -    Acquired trigger finger of left ring finger 02/18/2019   Injected February 18, 2019 and March 2022   Allergic rhinitis    Anemia    Arthritis    Bipolar disorder 07/05/2010   Cataract    Chronic lower back pain 07/05/2010   Chronic neck pain    Diabetic gastroparesis associated with type 1 diabetes mellitus 10/24/2016   Fibromyalgia    Generalized anxiety disorder 04/27/2014   GERD (gastroesophageal reflux disease) 07/05/2010   Hepatitis B virus infection 07/05/2010   History of substance abuse  opiates; cocaine   Hypoglycemia unawareness associated with type 1 diabetes mellitus 11/23/2020   Insomnia 04/13/2015   Major depressive disorder 07/28/2014   Migraine headache 07/05/2010   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  It hasn't happened in a long time."   Smokers' cough 07/05/2010   Tubular adenoma  of colon 2018   Type 1 diabetes mellitus with complication 02/06/2013   WBC decreased 03/27/2017    Past Surgical History:  Procedure Laterality Date   CESAREAN SECTION     COLONOSCOPY     TUBAL LIGATION     UPPER GASTROINTESTINAL ENDOSCOPY      Family Psychiatric History: Reviewed  Family History:  Family History  Problem Relation Age of Onset   Arthritis Mother    Diverticulosis Mother    Kidney disease Mother    Hyperlipidemia Mother    Arthritis Father    Kidney disease Father    Kidney cancer Father    Bladder Cancer Sister    Colon cancer Sister 70   Celiac disease Sister    Heart attack Brother    Kidney cancer Paternal Aunt    Diabetes Niece    Hyperlipidemia Other    Anxiety disorder Neg Hx    Bipolar disorder Neg Hx    Depression Neg Hx    Breast cancer Neg Hx    Esophageal cancer Neg Hx    Stomach cancer Neg Hx    Rectal cancer Neg Hx     Social History:  Social History   Socioeconomic History   Marital status: Divorced    Spouse name: Not on file   Number of children: 1   Years of education: 12   Highest education level: High school graduate  Occupational History   Occupation: Disability  Tobacco Use   Smoking status: Every Day    Current packs/day: 1.50    Average packs/day: 1.5 packs/day for 50.0 years (75.0 ttl pk-yrs)    Types: Cigarettes   Smokeless tobacco: Never   Tobacco comments:    Reports she has cut back to a cartoon that now lasts 2 weeks.   Vaping Use   Vaping status: Former  Substance and Sexual Activity   Alcohol use: Yes    Alcohol/week: 7.0 standard drinks of alcohol    Types: 7 Cans of beer per week    Comment: 1 beer nightly   Drug use: Not Currently    Types: Cocaine    Comment: past opiate and cocaine abuse   Sexual activity: Not Currently    Birth control/protection: Surgical, Post-menopausal  Other Topics Concern   Not on file  Social History Narrative   Divorced, disabled   Lives with parents   Right  handed    Retired   Caffeine prn   Social Drivers of Corporate investment banker Strain: Low Risk  (03/14/2022)   Overall Financial Resource Strain (CARDIA)    Difficulty of Paying Living Expenses: Not hard at all  Food Insecurity: No Food Insecurity (05/23/2023)   Hunger Vital Sign    Worried About Running Out of Food in the Last Year: Never true    Ran Out of Food in the Last Year: Never true  Transportation Needs: No  Transportation Needs (05/23/2023)   PRAPARE - Administrator, Civil Service (Medical): No    Lack of Transportation (Non-Medical): No  Physical Activity: Sufficiently Active (03/14/2022)   Exercise Vital Sign    Days of Exercise per Week: 5 days    Minutes of Exercise per Session: 30 min  Stress: Stress Concern Present (03/14/2022)   [employer] of Occupational Health - Occupational Stress Questionnaire    Feeling of Stress : To some extent  Social Connections: Socially Integrated (03/14/2022)   Social Connection and Isolation Panel [NHANES]    Frequency of Communication with Friends and Family: More than three times a week    Frequency of Social Gatherings with Friends and Family: More than three times a week    Attends Religious Services: More than 4 times per year    Active Member of [employer] or Organizations: Yes    Attends Engineer, structural: More than 4 times per year    Marital Status: Married    Allergies:  Allergies  Allergen Reactions   Penicillins Anaphylaxis   Sulfonamide Derivatives Anaphylaxis    Metabolic Disorder Labs: Lab Results  Component Value Date   HGBA1C 7.3 (H) 06/21/2023   No results found for: "PROLACTIN" Lab Results  Component Value Date   CHOL 218 (H) 06/22/2023   TRIG 67 06/22/2023   HDL 110 06/22/2023   CHOLHDL 2.0 06/22/2023   VLDL 16.1 11/10/2022   LDLCALC 96 06/22/2023   LDLCALC 73 11/10/2022   Lab Results  Component Value Date   TSH 0.71 06/07/2021   TSH 1.78 06/13/2018    Therapeutic  Level Labs: No results found for: "LITHIUM" No results found for: "VALPROATE" No results found for: "CBMZ"  Current Medications: Current Outpatient Medications  Medication Sig Dispense Refill   Accu-Chek Softclix Lancets lancets Use as instructed to check blood sugar 4 times per day dx code E10.65 400 each 1   Alcohol Swabs (B-D SINGLE USE SWABS REGULAR) PADS USE FOUR TIMES DAILY 400 each 2   aspirin EC 81 MG tablet Take 81 mg by mouth every morning.     B-D ULTRAFINE III SHORT PEN 31G X 8 MM MISC USE TO INJECT WITH PRE-FILLED PEN SYRINGE SUB-Q DAILY 400 each 2   Blood Glucose Calibration (ACCU-CHEK AVIVA) SOLN USE AS DIRECTED 1 each 1   Blood Glucose Monitoring Suppl (ACCU-CHEK AVIVA PLUS) w/Device KIT USE AS DIRECTED 1 kit 0   cetirizine (ZYRTEC) 10 MG tablet Take 1 tablet (10 mg total) by mouth daily. 90 tablet 3   Clobetasol Propionate 0.05 % shampoo Apply topically.     Continuous Blood Gluc Receiver (FREESTYLE LIBRE 3 READER) DEVI 1 Device by Does not apply route continuous. 1 each 0   Continuous Blood Gluc Sensor (FREESTYLE LIBRE 2 SENSOR) MISC Use to check blood sugar daily 2 each 12   Continuous Blood Gluc Sensor (FREESTYLE LIBRE 3 SENSOR) MISC Place 1 sensor on the skin every 14 days. Use to check glucose continuously 6 each 3   cyclobenzaprine (FLEXERIL) 5 MG tablet Take 1 tablet (5 mg total) by mouth 3 (three) times daily as needed for muscle spasms. 30 tablet 1   donepezil (ARICEPT) 10 MG tablet TAKE 1 TABLET BY MOUTH AT NIGHT 60 tablet 5   fexofenadine (ALLEGRA) 180 MG tablet Take 180 mg by mouth daily.     glucose blood (ACCU-CHEK AVIVA PLUS) test strip CHECK BLOOD SUGAR 4 TIMES DAILY 400 strip 3   GVOKE HYPOPEN 2-PACK  1 MG/0.2ML SOAJ INJECT 1 MG UNDER THE SKIN AS NEEDED FOR LOW BLOOD SUGAR 0.4 mL 2   hyoscyamine (LEVBID) 0.375 MG 12 hr tablet Take 1 tablet (0.375 mg total) by mouth 2 (two) times daily. 60 tablet 11   insulin detemir (LEVEMIR FLEXTOUCH) 100 UNIT/ML FlexPen  Inject 4 units in the morning and 9 units at bedtime 15 mL 5   insulin glargine (LANTUS) 100 UNIT/ML Solostar Pen Take 5 units in the morning and 5 units at bedtime. 15 mL 4   insulin lispro (HUMALOG KWIKPEN) 100 UNIT/ML KwikPen INJECT SUBCUTANEOUSLY 2-5  UNITS 3 TIMES DAILY AS INSTRUCTED MAXIMUM 20 UNITS A DAY. 30 mL 4   Insulin Pen Needle 32G X 4 MM MISC Use inject insulin tid 300 each 2   Insulin Syringe-Needle U-100 (B-D INSULIN SYRINGE) 31G X 5/16" 0.3 ML MISC Use to administer insulin 10 each 2   lamoTRIgine (LAMICTAL) 100 MG tablet Take 1 tablet (100 mg total) by mouth 2 (two) times daily. Bridge to patient appt 2/10 30 tablet 0   lipase/protease/amylase (CREON) 36000 UNITS CPEP capsule Take 2 capsules by mouth three times a day with meals and 1 capsule by mouth twice daily with snacks 240 capsule 11   Magnesium 500 MG TABS Take 1 tablet by mouth daily.     metoprolol succinate (TOPROL XL) 25 MG 24 hr tablet Take 1 tablet (25 mg total) by mouth daily. 90 tablet 3   Multiple Vitamin (MULTIVITAMIN) tablet Take 1 tablet by mouth daily. Reported on 11/22/2015     Omega-3 Fatty Acids (FISH OIL) 1000 MG CAPS Take 1 capsule by mouth daily.     omeprazole (PRILOSEC) 40 MG capsule TAKE 1 CAPSULE(40 MG) BY MOUTH IN THE MORNING AND AT BEDTIME 180 capsule 0   rosuvastatin (CRESTOR) 40 MG tablet Take 1 tablet (40 mg total) by mouth daily. 90 tablet 3   traZODone (DESYREL) 100 MG tablet Take 2 tablets (200 mg total) by mouth at bedtime. Bridge to patient appt 2/10 30 tablet 0   triamcinolone cream (KENALOG) 0.1 %      umeclidinium-vilanterol (ANORO ELLIPTA) 62.5-25 MCG/ACT AEPB INHALE 1 PUFF INTO THE LUNGS DAILY 60 each 5   vitamin E 1000 UNIT capsule Take 1,000 Units by mouth daily.     dapagliflozin propanediol (FARXIGA) 10 MG TABS tablet Take 1 tablet (10 mg total) by mouth daily. (Patient not taking: Reported on 08/16/2023) 30 tablet 11   gabapentin (NEURONTIN) 300 MG capsule TAKE 1 CAPSULE(300 MG) BY  MOUTH THREE TIMES DAILY (Patient not taking: Reported on 08/16/2023) 90 capsule 5   LORazepam (ATIVAN) 0.5 MG tablet 1-2 tabs 30 - 60 min prior to MRI. Do not drive with this medicine. (Patient not taking: Reported on 08/16/2023) 4 tablet 0   No current facility-administered medications for this visit.     Musculoskeletal: Strength & Muscle Tone: within normal limits Gait & Station: normal Patient leans: N/A  Psychiatric Specialty Exam: Review of Systems  Constitutional:  Positive for fatigue.  Psychiatric/Behavioral:  Positive for dysphoric mood.     Blood pressure (!) 164/72, pulse 67, height 5\' 3"  (1.6 m), weight 108 lb (49 kg).Body mass index is 19.13 kg/m.  General Appearance: Casual  Eye Contact:  Fair  Speech:   fast  Volume:  Normal  Mood:  Anxious and Dysphoric  Affect:  Labile  Thought Process:  Descriptions of Associations: Intact  Orientation:  Full (Time, Place, and Person)  Thought Content: Rumination  Suicidal Thoughts:  No  Homicidal Thoughts:  No  Memory:  Immediate;   Good Recent;   Fair Remote;   Fair  Judgement:  Intact  Insight:  Shallow  Psychomotor Activity:  Increased and Restlessness  Concentration:  Concentration: Fair and Attention Span: Fair  Recall:  [employer] of Knowledge: Fair  Language: Good  Akathisia:  No  Handed:  Right  AIMS (if indicated): not done  Assets:  Communication Skills Desire for Improvement Housing Social Support Transportation  ADL's:  Intact  Cognition: WNL  Sleep:   Too much   Screenings: AUDIT    Flowsheet Row Clinical Support from 03/14/2022 in [facility]  Alcohol Use Disorder Identification Test Final Score (AUDIT) 4      GAD-7    Flowsheet Row [facility] Visit [facility]16[facility]20[facility] Office Visit from 12/16/2020 in [facility] Office Visit from 06/17/2019 in [facility] Office Visit from 11/27/2017 in [facility]  Total GAD-7 Score 9 17 19 18       Mini-Mental    Flowsheet Row Office Visit from 02/27/2023 in [facility] Office Visit from 08/23/2022 in [facility] Office Visit from 12/03/2020 in [facility] Office Visit from 05/30/2017 in [facility]  Total Score (max 30 points ) 28 30 22 27       PHQ2-9    Flowsheet Row Office Visit from 08/16/2023 in [facility] Office Visit from 06/21/2023 in [facility] Office Visit from 12/05/2022 in [facility] [place] Office Visit from 10/05/2022 in [facility] Office Visit from 04[facility]06/2022 in [facility]  PHQ-2 Total Score 2 0 0 3 6  PHQ-9 Total Score 8 0 -- 19 24      Flowsheet Row ED from 12/24/2022 in [facility] at [facility] Office Visit from 10/05/2022 in [facility] ED from 06/14/2022 in [facility] Department at [facility]  C-SSRS RISK CATEGORY No Risk Error: Q3, 4, or 5 should not be populated when Q2 is No No Risk        Assessment and Plan:  I review residual symptoms of anxiety and depression.  PHQ and GAD-7 done.  She had missed 2 appointments and I discussed risk of noncompliance with medication follow-up causing worsening of symptoms.  Patient is now open to try a different medication.  She struggled with social anxiety and does not leave the house in some time declared in self hygiene.  She admitted getting irritable with her 17 year old mother.  She admitted feeling isolated and nervous most of the time.  In the past she had tried Zoloft, Wellbutrin, BuSpar, Paxil, Lexapro with poor outcome.  She had never tried Trintellix.  I recommend  to try low-dose Trintellix 5 mg to see if that helps her anxiety.  She does not want any medication that cause weight gain.  I discussed medication side effects and benefits.  I also recommend to decrease trazodone 150 mg as sleeping too much.  Continue Lamictal 100 mg twice a day.  She has no rash, itching, tremors or shakes.  I reviewed blood work results.  Encourage hydration and watching her calorie intake.  Encourage walking, exercise.  Recommended to call us back if  she has any question or any concern.  Follow-up in 3 months.  Collaboration of Care: Collaboration of Care: Other provider involved in patient's care AEB notes are available in epic to review  Patient/Guardian was advised Release of Information must be obtained prior to any record release in order to collaborate their care with an outside provider. Patient/Guardian was advised if they have not already done so to contact the registration department to sign all necessary forms in order for [place] to release information regarding their care.   Consent: Patient/Guardian gives verbal consent for treatment and assignment of benefits for services provided during this visit. Patient/Guardian expressed understanding and agreed to proceed.   I spent 41 minutes face-to-face with the patient during this encounter.  Cleotis Nipper, MD 08/16/2023, 3:32 PM

## 2023-08-21 ENCOUNTER — Other Ambulatory Visit: Payer: Self-pay

## 2023-08-21 DIAGNOSIS — E1065 Type 1 diabetes mellitus with hyperglycemia: Secondary | ICD-10-CM

## 2023-08-21 MED ORDER — BD PEN NEEDLE SHORT U/F 31G X 8 MM MISC: 2 refills | Status: AC

## 2023-08-30 ENCOUNTER — Encounter: Payer: Self-pay | Admitting: Physician Assistant

## 2023-08-30 ENCOUNTER — Ambulatory Visit: Payer: Medicare Other | Admitting: Physician Assistant

## 2023-08-30 VITALS — BP 165/66 | Resp 20 | Ht 63.0 in

## 2023-08-30 DIAGNOSIS — F0670 Mild neurocognitive disorder due to known physiological condition without behavioral disturbance: Secondary | ICD-10-CM

## 2023-08-30 MED ORDER — DONEPEZIL HCL 10 MG PO TABS: 10.0000 mg | ORAL_TABLET | Freq: Every day | ORAL | 3 refills | Status: DC

## 2023-08-30 NOTE — Progress Notes (Signed)
 Assessment/Plan:   Mild cognitive impairment of multiple etiologies  Leslie Lane is a very pleasant 68 y.o. RH female with a history of type 1 diabetes mellitus, hyperlipidemia, fibromyalgia, history of migraines, major depressive disorder, sleep disorder, GAD, bipolar disorder, cutaneous lupus and with a history of mild cognitive impairment per neuropsych evaluation 03/29/2021 (which was discontinued prematurely due to anxiety)  presenting today in follow-up for evaluation of memory loss. Memory is stable, MMSE today 30/30 . Patient is on donepezil 10 mg daily, tolerating well. She continues to smoke, cessation has been counseled.  Mood is controlled by psychiatry.  She is able to participate on her ADLs, drives without difficulty.      Recommendations:   Follow up in 6 months. Continue donepezil 10 mg, side effects discussed Recommend good control of cardiovascular risk factors.  She has been informed of her elevated blood pressure. Continue to control mood as per psychiatry for social anxiety disorder, insomnia and severe MDD.  She is on Trintellix, Lamictal trazodone. Tobacco cessation counseled Recommend increasing outside activities or social and cognitive stimulation    Subjective:   This patient is here alone . Previous records as well as any outside records available were reviewed prior to todays visit.   Patient was last seen on 02/27/2023 with an MMSE of 28/30     Any changes in memory since last visit? "About the same".  She has subjective memory loss complaints, reporting trouble retrieving words and as before, sometimes "I speak too fast and I am rushing to match, I do not pay attention".  She is able to retain recent conversations and names.  LTM is good.  She has not been going to [place] but have been visiting friends otherwise does not like leaving the house.  She does not like doing any brain stimulating exercises. repeats oneself?  Endorsed Disoriented when walking  into a room?  Patient denies    Misplacing objects?  Patient denies   Wandering behavior?   Denies.   Any personality changes since last visit?  She struggles with anxiety, feeling tired with amotivation Any worsening depression?:  She has a history of MDD, seen in psychiatrist.  She is experiencing more anxiety when she cannot concentrate well, does not like being around people. Hallucinations or paranoia?  Denies.   Seizures?   Denies.    Any sleep changes? Sleeps well with trazodone, which has been reduced in dose.  Denies vivid dreams, REM behavior or sleepwalking   Sleep apnea?   denies    Any hygiene concerns?   denies   Independent of bathing and dressing?  Endorsed  Does the patient needs help with medications? Patient is in charge   Who is in charge of the finances?  Patient is in charge     Any changes in appetite?  Denies. "Too good, I eat, especially when bored"     Patient have trouble swallowing?  denies   Does the patient cook?  Any kitchen accidents such as leaving the stove on?   denies   Any headaches?    denies   Vision changes? denies Chronic pain?  She has a history of chronic pain due to fibromyalgia, as well as chronic right shoulder pain  Ambulates with difficulty?    denies    Recent falls or head injuries?    denies      Unilateral weakness, numbness or tingling?   denies   Any tremors?  denies   Any anosmia?  denies   Any incontinence of urine?  She has a history of urge incontinence. Any bowel dysfunction?  She has a history of chronic diarrhea due to pancreatic insufficiency.  She is on Robinul Patient lives with her mother who has dementia  Does the patient drive?  Endorsed, during the day only and short distances, familiar places.  Tobacco?  She smokes 1 carton every 2 weeks, trying to quit    History on Initial Assessment 12/18/2018: This is a 68 year old woman with a history of type I DM, hyperlipidemia, fibromyalgia, migraines, major depressive  disorder, GAD, rule out bipolar disorder, presenting for evaluation of memory loss. She is alone in the office with no family to corroborate history. She states memory changes started after she was in the hospital in a diabetic coma at age 59. She states she has not noticed any changes since then, memory has been about the same since then. She initially could not drive a car or remember a movie she had watched. She acquired a guardian to manage her finances. She lives with her mother. She has been told she repeats herself and says the same thing every morning. She used to leave the stove on, but has been more vigilant. She has been driving and has gotten lost driving in unfamiliar roads. She manages her medications independently. She states she does not like math and was not good in school. She has reported her memory concerns to her psychiatrist and reported that she cannot focus on anything. She is easily irritable. No paranoia or hallucinations. Sleep is good with Trazodone. No family history of dementia. No history of significant head injuries. She used to drink heavily when younger, then cut down to a beer every now and then after her diabetic coma.    She has headaches that improve when she is not smoking. She has a history of migraines and has mild bitemporal headaches, taking daily magnesium. She has chronic neck and back pain and neuropathy in her feet. No dizziness, vision changes, focal weakness, bowel/bladder dysfunction, anosmia, or tremors.     Neurocognitive testing 03/29/21 Dr. Milbert Coulter Briefly, Leslie Lane exhibited very limited testing tolerance and abruptly discontinued the evaluation prematurely. Unfortunately, given the abbreviated nature of testing, several domains were unable to be thoroughly assessed and the current conceptualization is limited by this. Ms. Mericle pattern of performance is suggestive of prominent difficulties with basic attention, expressive language, visuospatial abilities,  and encoding (i.e., learning) aspects of memory. Overall, remote substance abuse history, as well as chronic and acute psychiatric distress, is very likely exacerbating deficits related to various vascular conditions. Ongoing sleep dysfunction, chronic pain, and frequent headaches would also contribute to dysfunction.    Past Medical History:  Diagnosis Date   Abdominal pain 02/28/2016   Abnormal CT scan, bladder    Noted 07/28/13 CT - s/p uro eval 08/2013 [place] -    Acquired trigger finger of left ring finger 02/18/2019   Injected February 18, 2019 and March 2022   Allergic rhinitis    Anemia    Arthritis    Bipolar disorder 07/05/2010   Cataract    Chronic lower back pain 07/05/2010   Chronic neck pain    Diabetic gastroparesis associated with type 1 diabetes mellitus 10/24/2016   Fibromyalgia    Generalized anxiety disorder 04/27/2014   GERD (gastroesophageal reflux disease) 07/05/2010   Hepatitis B virus infection 07/05/2010   History of substance abuse    opiates; cocaine   Hypoglycemia unawareness  associated with type 1 diabetes mellitus 11/23/2020   Insomnia 04/13/2015   Major depressive disorder 07/28/2014   Migraine headache 07/05/2010   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  It hasn't happened in a long time."   Smokers' cough 07/05/2010   Tubular adenoma of colon 2018   Type 1 diabetes mellitus with complication 02/06/2013   WBC decreased 03/27/2017     Past Surgical History:  Procedure Laterality Date   CESAREAN SECTION     COLONOSCOPY     TUBAL LIGATION     UPPER GASTROINTESTINAL ENDOSCOPY       PREVIOUS MEDICATIONS:   CURRENT MEDICATIONS:  Outpatient Encounter Medications as of  08/30/2023  Medication Sig   Accu-Chek Softclix Lancets lancets Use as instructed to check blood sugar 4 times per day dx code E10.65   Alcohol Swabs (B-D SINGLE USE SWABS REGULAR) PADS USE FOUR TIMES DAILY   aspirin EC 81 MG tablet Take 81 mg by mouth every morning.   B-D ULTRAFINE III SHORT PEN 31G X 8 MM MISC USE TO INJECT WITH PRE-FILLED PEN SYRINGE SUB-Q DAILY   Blood Glucose Calibration (ACCU-CHEK AVIVA) SOLN USE AS DIRECTED   Blood Glucose Monitoring Suppl (ACCU-CHEK AVIVA PLUS) w/Device KIT USE AS DIRECTED   cetirizine (ZYRTEC) 10 MG tablet Take 1 tablet (10 mg total) by mouth daily.   Clobetasol Propionate 0.05 % shampoo Apply topically.   Continuous Blood Gluc Receiver (FREESTYLE LIBRE 3 READER) DEVI 1 Device by Does not apply route continuous.   Continuous Blood Gluc Sensor (FREESTYLE LIBRE 2 SENSOR) MISC Use to check blood sugar daily   Continuous Blood Gluc Sensor (FREESTYLE LIBRE 3 SENSOR) MISC Place 1 sensor on the skin every 14 days. Use to check glucose continuously   cyclobenzaprine (FLEXERIL) 5 MG tablet Take 1 tablet (5 mg total) by mouth 3 (three) times daily as needed for muscle spasms.   dapagliflozin propanediol (FARXIGA) 10 MG TABS tablet Take 1 tablet (10 mg total) by mouth daily. (Patient not taking: Reported on 08/16/2023)   donepezil (ARICEPT) 10 MG tablet Take 1 tablet (10 mg total) by mouth at bedtime.   fexofenadine (ALLEGRA) 180 MG tablet Take 180 mg by mouth daily.   gabapentin (NEURONTIN) 300 MG capsule TAKE 1 CAPSULE(300 MG) BY MOUTH THREE TIMES DAILY (Patient not taking: Reported on 08/16/2023)   glucose blood (ACCU-CHEK AVIVA PLUS) test strip CHECK BLOOD SUGAR 4 TIMES DAILY   GVOKE HYPOPEN 2-PACK 1 MG/0.2ML SOAJ INJECT 1 MG UNDER THE SKIN AS NEEDED FOR LOW BLOOD SUGAR   hyoscyamine (LEVBID) 0.375 MG 12 hr tablet Take 1 tablet (0.375 mg total) by mouth 2 (two) times daily.   insulin detemir (LEVEMIR FLEXTOUCH) 100 UNIT/ML FlexPen Inject 4 units in the morning  and 9 units at bedtime   insulin glargine (LANTUS) 100 UNIT/ML Solostar Pen Take 5 units in the morning and 5 units at bedtime.   insulin lispro (HUMALOG KWIKPEN) 100 UNIT/ML KwikPen INJECT SUBCUTANEOUSLY 2-5  UNITS 3 TIMES DAILY AS INSTRUCTED MAXIMUM 20 UNITS A DAY.   Insulin Pen Needle 32G X 4 MM MISC Use inject insulin tid   Insulin Syringe-Needle U-100 (B-D INSULIN SYRINGE) 31G X 5/16" 0.3 ML MISC Use to administer  insulin   lamoTRIgine (LAMICTAL) 100 MG tablet Take 1 tablet (100 mg total) by mouth 2 (two) times daily.   lipase/protease/amylase (CREON) 36000 UNITS CPEP capsule Take 2 capsules by mouth three times a day with meals and 1 capsule by mouth twice daily with snacks   LORazepam (ATIVAN) 0.5 MG tablet 1-2 tabs 30 - 60 min prior to MRI. Do not drive with this medicine. (Patient not taking: Reported on 08/16/2023)   Magnesium 500 MG TABS Take 1 tablet by mouth daily.   metoprolol succinate (TOPROL XL) 25 MG 24 hr tablet Take 1 tablet (25 mg total) by mouth daily.   Multiple Vitamin (MULTIVITAMIN) tablet Take 1 tablet by mouth daily. Reported on 11/22/2015   Omega-3 Fatty Acids (FISH OIL) 1000 MG CAPS Take 1 capsule by mouth daily.   omeprazole (PRILOSEC) 40 MG capsule TAKE 1 CAPSULE(40 MG) BY MOUTH IN THE MORNING AND AT BEDTIME   rosuvastatin (CRESTOR) 40 MG tablet Take 1 tablet (40 mg total) by mouth daily.   traZODone (DESYREL) 150 MG tablet Take 1 tablet (150 mg total) by mouth at bedtime.   triamcinolone cream (KENALOG) 0.1 %    umeclidinium-vilanterol (ANORO ELLIPTA) 62.5-25 MCG/ACT AEPB INHALE 1 PUFF INTO THE LUNGS DAILY   vitamin E 1000 UNIT capsule Take 1,000 Units by mouth daily.   vortioxetine HBr (TRINTELLIX) 5 MG TABS tablet Take 1 tablet (5 mg total) by mouth daily.   [DISCONTINUED] donepezil (ARICEPT) 10 MG tablet TAKE 1 TABLET BY MOUTH AT NIGHT   No facility-administered encounter medications on file as of 08/30/2023.     Objective:     PHYSICAL EXAMINATION:     VITALS:   Vitals:   08/30/23 1455  BP: (!) 165/66  Resp: 20  Height: 5\' 3"  (1.6 m)    GEN:  The patient appears stated age and is in NAD. HEENT:  Normocephalic, atraumatic.   Neurological examination:  General: NAD, well-groomed, appears stated age. Orientation: The patient is alert. Oriented to person, place and date Cranial nerves: There is good facial symmetry.The speech is fluent and clear. No aphasia or dysarthria. Fund of knowledge is appropriate. Recent memory impaired and remote memory is normal.  Attention and concentration are normal.  Able to name objects and repeat phrases.  Hearing is intact to conversational tone.   Delayed recall 3/3 Sensation: Sensation is intact to light touch throughout Motor: Strength is at least antigravity x4. DTR's 2/4 in UE/LE      07/30/2019    1:00 PM 12/18/2018   11:00 AM  Montreal Cognitive Assessment   Visuospatial/ Executive (0/5)  1  Naming (0/3)  3  Attention: Read list of digits (0/2) 1 1  Attention: Read list of letters (0/1) 1 1  Attention: Serial 7 subtraction starting at 100 (0/3) 1 2  Language: Repeat phrase (0/2) 0 0  Language : Fluency (0/1) 0 0  Abstraction (0/2) 2 0  Delayed Recall (0/5) 4 0  Orientation (0/6) 6 5  Total  13  Adjusted Score (based on education)  14       02/27/2023    1:00 PM 08/23/2022    1:00 PM 12/03/2020    3:00 PM  MMSE - Mini Mental State Exam  Orientation to time 4 5 5   Orientation to Place 5 5 5   Registration 3 3 3   Attention/ Calculation 5 5 0  Recall 3 3 0  Language- name 2 objects 2 2 2   Language- repeat 1 1 1  Language- follow 3 step command 3 3 3   Language- read & follow direction 1 1 1   Write a sentence 1 1 1   Copy design 0 1 1  Total score 28 30 22        Movement examination: Tone: There is normal tone in the UE/LE Abnormal movements:  no tremor.  No myoclonus.  No asterixis.   Coordination:  There is no decremation with RAM's. Normal finger to nose  Gait and Station:  The patient has no difficulty arising out of a deep-seated chair without the use of the hands. The patient's stride length is good.  Gait is cautious and narrow.   Thank you for allowing [place] the opportunity to participate in the care of this nice patient. Please do not hesitate to contact us for any questions or concerns.   Total time spent on today's visit was 20 minutes dedicated to this patient today, preparing to see patient, examining the patient, ordering tests and/or medications and counseling the patient, documenting clinical information in the EHR or other health record, independently interpreting results and communicating results to the patient/family, discussing treatment and goals, answering patient's questions and coordinating care.  Cc:  Myrlene Broker, MD  Marlowe Kays 08/30/2023 4:31 PM

## 2023-08-30 NOTE — Patient Instructions (Signed)
 It was a pleasure to see you today at our office.   Recommendations:  Meds: Continue Donepezil 10 mg: take 1 tablet in the morning Follow up in 6 months  Monitor your sugars  Follow up with psychiatry, GI, Primary doctors  Stop smoking   Get involved with activities outside of the house   RECOMMENDATIONS FOR ALL PATIENTS WITH MEMORY PROBLEMS: 1. Continue to exercise (Recommend 30 minutes of walking everyday, or 3 hours every week) 2. Increase social interactions - continue going to [place] and enjoy social gatherings with friends and family 3. Eat healthy, avoid fried foods and eat more fruits and vegetables 4. Maintain adequate blood pressure, blood sugar, and blood cholesterol level. Reducing the risk of stroke and cardiovascular disease also helps promoting better memory. 5. Avoid stressful situations. Live a simple life and avoid aggravations. Organize your time and prepare for the next day in anticipation. 6. Sleep well, avoid any interruptions of sleep and avoid any distractions in the bedroom that may interfere with adequate sleep quality 7. Avoid sugar, avoid sweets as there is a strong link between excessive sugar intake, diabetes, and cognitive impairment We discussed the Mediterranean diet, which has been shown to help patients reduce the risk of progressive memory disorders and reduces cardiovascular risk. This includes eating fish, eat fruits and green leafy vegetables, nuts like almonds and hazelnuts, walnuts, and also use olive oil. Avoid fast foods and fried foods as much as possible. Avoid sweets and sugar as sugar use has been linked to worsening of memory function.  There is always a concern of gradual progression of memory problems. If this is the case, then we may need to adjust level of care according to patient needs. Support, both to the patient and caregiver, should then be put into place.      You have been referred for a neuropsychological evaluation (i.e.,  evaluation of memory and thinking abilities). Please bring someone with you to this appointment if possible, as it is helpful for the doctor to hear from both you and another adult who knows you well. Please bring eyeglasses and hearing aids if you wear them.    The evaluation will take approximately 3 hours and has two parts:   The first part is a clinical interview with the neuropsychologist (Dr. Milbert Coulter or Dr. Roseanne Reno). During the interview, the neuropsychologist will speak with you and the individual you brought to the appointment.    The second part of the evaluation is testing with the doctor's technician Annabelle Harman or Selena Batten). During the testing, the technician will ask you to remember different types of material, solve problems, and answer some questionnaires. Your family member will not be present for this portion of the evaluation.   Please note: We must reserve several hours of the neuropsychologist's time and the psychometrician's time for your evaluation appointment. As such, there is a No-Show fee of $100. If you are unable to attend any of your appointments, please contact our office as soon as possible to reschedule.    FALL PRECAUTIONS: Be cautious when walking. Scan the area for obstacles that may increase the risk of trips and falls. When getting up in the mornings, sit up at the edge of the bed for a few minutes before getting out of bed. Consider elevating the bed at the head end to avoid drop of blood pressure when getting up. Walk always in a well-lit room (use night lights in the walls). Avoid area rugs or power cords from appliances  in the middle of the walkways. Use a walker or a cane if necessary and consider physical therapy for balance exercise. Get your eyesight checked regularly.  FINANCIAL OVERSIGHT: Supervision, especially oversight when making financial decisions or transactions is also recommended.  HOME SAFETY: Consider the safety of the kitchen when operating appliances like  stoves, microwave oven, and blender. Consider having supervision and share cooking responsibilities until no longer able to participate in those. Accidents with firearms and other hazards in the house should be identified and addressed as well.   ABILITY TO BE LEFT ALONE: If patient is unable to contact 911 operator, consider using LifeLine, or when the need is there, arrange for someone to stay with patients. Smoking is a fire hazard, consider supervision or cessation. Risk of wandering should be assessed by caregiver and if detected at any point, supervision and safe proof recommendations should be instituted.  MEDICATION SUPERVISION: Inability to self-administer medication needs to be constantly addressed. Implement a mechanism to ensure safe administration of the medications.   DRIVING: Regarding driving, in patients with progressive memory problems, driving will be impaired. We advise to have someone else do the driving if trouble finding directions or if minor accidents are reported. Independent driving assessment is available to determine safety of driving.   If you are interested in the driving assessment, you can contact the following:  [facility] [employer] in [place] [phone]  [facility] [phone]  [facility] [phone] or [phone]    Mediterranean Diet A Mediterranean diet refers to food and lifestyle choices that are based on the traditions of countries located on the [employer]. This way of eating has been shown to help prevent certain conditions and improve outcomes for people who have chronic diseases, like kidney disease and heart disease. What are tips for following this plan? Lifestyle  Cook and eat meals together with your family, when possible. Drink enough fluid to keep your urine clear or pale yellow. Be physically active every day. This includes: Aerobic exercise like running  or swimming. Leisure activities like gardening, walking, or housework. Get 7-8 hours of sleep each night. If recommended by your health care provider, drink red wine in moderation. This means 1 glass a day for nonpregnant women and 2 glasses a day for men. A glass of wine equals 5 oz (150 mL). Reading food labels  Check the serving size of packaged foods. For foods such as rice and pasta, the serving size refers to the amount of cooked product, not dry. Check the total fat in packaged foods. Avoid foods that have saturated fat or trans fats. Check the ingredients list for added sugars, such as corn syrup. Shopping  At the grocery store, buy most of your food from the areas near the walls of the store. This includes: Fresh fruits and vegetables (produce). Grains, beans, nuts, and seeds. Some of these may be available in unpackaged forms or large amounts (in bulk). Fresh seafood. Poultry and eggs. Low-fat dairy products. Buy whole ingredients instead of prepackaged foods. Buy fresh fruits and vegetables in-season from local farmers markets. Buy frozen fruits and vegetables in resealable bags. If you do not have access to quality fresh seafood, buy precooked frozen shrimp or canned fish, such as tuna, salmon, or sardines. Buy small amounts of raw or cooked vegetables, salads, or olives from the deli or salad bar at your store. Stock your pantry so you always have certain foods on hand, such as olive  oil, canned tuna, canned tomatoes, rice, pasta, and beans. Cooking  Cook foods with extra-virgin olive oil instead of using butter or other vegetable oils. Have meat as a side dish, and have vegetables or grains as your main dish. This means having meat in small portions or adding small amounts of meat to foods like pasta or stew. Use beans or vegetables instead of meat in common dishes like chili or lasagna. Experiment with different cooking methods. Try roasting or broiling vegetables instead of  steaming or sauteing them. Add frozen vegetables to soups, stews, pasta, or rice. Add nuts or seeds for added healthy fat at each meal. You can add these to yogurt, salads, or vegetable dishes. Marinate fish or vegetables using olive oil, lemon juice, garlic, and fresh herbs. Meal planning  Plan to eat 1 vegetarian meal one day each week. Try to work up to 2 vegetarian meals, if possible. Eat seafood 2 or more times a week. Have healthy snacks readily available, such as: Vegetable sticks with hummus. Greek yogurt. Fruit and nut trail mix. Eat balanced meals throughout the week. This includes: Fruit: 2-3 servings a day Vegetables: 4-5 servings a day Low-fat dairy: 2 servings a day Fish, poultry, or lean meat: 1 serving a day Beans and legumes: 2 or more servings a week Nuts and seeds: 1-2 servings a day Whole grains: 6-8 servings a day Extra-virgin olive oil: 3-4 servings a day Limit red meat and sweets to only a few servings a month What are my food choices? Mediterranean diet Recommended Grains: Whole-grain pasta. Brown rice. Bulgar wheat. Polenta. Couscous. Whole-wheat bread. Orpah Cobb. Vegetables: Artichokes. Beets. Broccoli. Cabbage. Carrots. Eggplant. Green beans. Chard. Kale. Spinach. Onions. Leeks. Peas. Squash. Tomatoes. Peppers. Radishes. Fruits: Apples. Apricots. Avocado. Berries. Bananas. Cherries. Dates. Figs. Grapes. Lemons. Melon. Oranges. Peaches. Plums. Pomegranate. Meats and other protein foods: Beans. Almonds. Sunflower seeds. Pine nuts. Peanuts. Cod. Salmon. Scallops. Shrimp. Tuna. Tilapia. Clams. Oysters. Eggs. Dairy: Low-fat milk. Cheese. Greek yogurt. Beverages: Water. Red wine. Herbal tea. Fats and oils: Extra virgin olive oil. Avocado oil. Grape seed oil. Sweets and desserts: [place] yogurt with honey. Baked apples. Poached pears. Trail mix. Seasoning and other foods: Basil. Cilantro. Coriander. Cumin. Mint. Parsley. Sage. Rosemary. Tarragon. Garlic.  Oregano. Thyme. Pepper. Balsalmic vinegar. Tahini. Hummus. Tomato sauce. Olives. Mushrooms. Limit these Grains: Prepackaged pasta or rice dishes. Prepackaged cereal with added sugar. Vegetables: Deep fried potatoes (french fries). Fruits: Fruit canned in syrup. Meats and other protein foods: Beef. Pork. Lamb. Poultry with skin. Hot dogs. Tomasa Blase. Dairy: Ice cream. Sour cream. Whole milk. Beverages: Juice. Sugar-sweetened soft drinks. Beer. Liquor and spirits. Fats and oils: Butter. Canola oil. Vegetable oil. Beef fat (tallow). Lard. Sweets and desserts: Cookies. Cakes. Pies. Candy. Seasoning and other foods: Mayonnaise. Premade sauces and marinades. The items listed may not be a complete list. Talk with your dietitian about what dietary choices are right for you. Summary The Mediterranean diet includes both food and lifestyle choices. Eat a variety of fresh fruits and vegetables, beans, nuts, seeds, and whole grains. Limit the amount of red meat and sweets that you eat. Talk with your health care provider about whether it is safe for you to drink red wine in moderation. This means 1 glass a day for nonpregnant women and 2 glasses a day for men. A glass of wine equals 5 oz (150 mL). This information is not intended to replace advice given to you by your health care provider. Make sure you discuss any questions  you have with your health care provider. Document Released: 02/10/2016 Document Revised: 03/14/2016 Document Reviewed: 02/10/2016 Elsevier Interactive Patient Education  2017 [employer].

## 2023-09-13 ENCOUNTER — Other Ambulatory Visit: Payer: Self-pay

## 2023-09-13 NOTE — Telephone Encounter (Signed)
 Patient called requesting a refill, however based on most recent there were already refills on her current RX. RN called to clarify if they still needed to be reordered.

## 2023-09-26 ENCOUNTER — Encounter: Payer: Self-pay | Admitting: Endocrinology

## 2023-09-26 ENCOUNTER — Ambulatory Visit: Payer: Medicare Other | Admitting: Endocrinology

## 2023-09-26 VITALS — BP 126/70 | HR 71 | Ht 63.0 in | Wt 108.4 lb

## 2023-09-26 DIAGNOSIS — E1065 Type 1 diabetes mellitus with hyperglycemia: Secondary | ICD-10-CM | POA: Diagnosis not present

## 2023-09-26 LAB — POCT GLYCOSYLATED HEMOGLOBIN (HGB A1C): Hemoglobin A1C: 6.8 % — AB (ref 4.0–5.6)

## 2023-09-26 MED ORDER — BLOOD GLUCOSE TEST VI STRP: 1.0000 | ORAL_STRIP | Freq: Three times a day (TID) | 3 refills | Status: AC

## 2023-09-26 MED ORDER — LANCETS MISC. MISC: 1.0000 | Freq: Three times a day (TID) | 3 refills | Status: AC

## 2023-09-26 NOTE — Patient Instructions (Signed)
 Decrease Lantus 5 units in the morning and 6 units at bedtime.   Humalog 2-3 units with meals 3 times a day, if you exercise do not take take humalog for the meal as well. No more than 3 units at a time.   Do not take humalog around bedtime for glucose around 200 or less.   Use Free style libre CGM monitor.

## 2023-09-26 NOTE — Progress Notes (Signed)
 Outpatient Endocrinology Note [place] Sherronda Sweigert, MD  09/26/23  Patient's Name: Leslie Lane    DOB: 1955/12/18    MRN: [id number]                                                    REASON OF VISIT: Follow up of type 1 diabetes mellitus  PCP: Myrlene Broker, MD  HISTORY OF PRESENT ILLNESS:   Leslie Lane is a 68 y.o. old female with past medical history listed below, is here for follow up for type 1 diabetes mellitus.   Pertinent Diabetes History: No was diagnosed with type 1 diabetes mellitus in 1967.  Chronic Diabetes Complications : Retinopathy: no. Last ophthalmology exam was done on annually, following with ophthalmology regularly.  Nephropathy: no /microalbuminuria present Peripheral neuropathy: yes, on gabapentin as needed.  Cymbalta. Coronary artery disease: no Stroke: no She has gastroparesis. Followed by gastroenterologist for abdominal pain and pancreatic insufficiency.  Relevant comorbidities and cardiovascular risk factors: Obesity: no Body mass index is 19.2 kg/m.  Hypertension: Yes  Hyperlipidemia : Yes, on statin   Current / Home Diabetic regimen includes: Lantus 6 units in the morning and 9 0r 10 units at bedtime.  Humalog 2-3 units with breakfast and lunch and 2-3, sometimes 7-8 units units with supper.  She takes variable doses.  She is on Farxiga 10 mg daily by cardiology.  Prior diabetic medications:  Glycemic data:    CONTINUOUS GLUCOSE MONITORING SYSTEM (CGMS) INTERPRETATION:                         FreeStyle Libre 3 + CGM-  Sensor Download (Sensor download was reviewed and summarized below.) Dates: March 13 to March 26 , 2025, 14 days.   Sensor Average: 156 Glucose Management Indicator: 7.0%  % data captured: 95    Impression: Variable blood sugar with frequent hyper and hypoglycemia.  Reports symptoms of hypoglycemia has decreased.  She is having hypoglycemia mostly in 60s after using Humalog for mealtime or correctional doses.   Some of the times random hyperglycemia with blood sugar up to 300 range postprandially.  Hypoglycemia: Patient has frequent hypoglycemic episodes. Patient has hypoglycemia ? awareness, has a glucagon ER kit.  Glucose tablets.  Factors modifying glucose control: 1.  Diabetic diet assessment: likes to eat junk food.   2.  Staying active or exercising: No formal exercise.  Physically active and walks multiple times a day.  3.  Medication compliance: compliant all of the time.  Interval history  CGM data as reviewed above. Still significant hypoglycemia, however percentage of hypoglycemia has decreased.  She continues to use the higher dose of basal insulin and the mealtime insulin despite suggesting to decrease dosage of insulin in the last visit.  She said she sometimes forgets to take insulin.  No other complaints today.  REVIEW OF SYSTEMS As per history of present illness.   PAST MEDICAL HISTORY: Past Medical History:  Diagnosis Date   Abdominal pain 02/28/2016   Abnormal CT scan, bladder    Noted 07/28/13 CT - s/p uro eval 08/2013 [place] -    Acquired trigger finger of left ring finger 02/18/2019   Injected February 18, 2019 and March 2022   Allergic rhinitis    Anemia    Arthritis    Bipolar disorder  07/05/2010   Cataract    Chronic lower back pain 07/05/2010   Chronic neck pain    Diabetic gastroparesis associated with type 1 diabetes mellitus 10/24/2016   Fibromyalgia    Generalized anxiety disorder 04/27/2014   GERD (gastroesophageal reflux disease) 07/05/2010   Hepatitis B virus infection 07/05/2010   History of substance abuse    opiates; cocaine   Hypoglycemia unawareness associated with type 1 diabetes mellitus 11/23/2020   Insomnia 04/13/2015   Major depressive disorder 07/28/2014   Migraine headache 07/05/2010   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  It hasn't happened in a long time."   Smokers' cough 07/05/2010   Tubular adenoma of colon 2018   Type 1 diabetes mellitus with complication 02/06/2013   WBC decreased 03/27/2017    PAST SURGICAL HISTORY: Past Surgical History:  Procedure Laterality Date   CESAREAN SECTION     COLONOSCOPY     TUBAL LIGATION     UPPER GASTROINTESTINAL ENDOSCOPY      ALLERGIES: Allergies  Allergen Reactions   Penicillins Anaphylaxis   Sulfonamide Derivatives Anaphylaxis    FAMILY HISTORY:  Family History  Problem Relation Age of Onset   Arthritis Mother    Diverticulosis Mother    Kidney disease Mother    Hyperlipidemia Mother    Arthritis Father    Kidney disease Father    Kidney cancer Father    Bladder Cancer Sister    Colon cancer Sister 70   Celiac disease Sister    Heart attack Brother    Kidney cancer Paternal Aunt    Diabetes Niece    Hyperlipidemia Other    Anxiety disorder Neg Hx    Bipolar disorder Neg Hx    Depression Neg Hx    Breast cancer Neg Hx    Esophageal cancer Neg Hx    Stomach cancer Neg Hx    Rectal cancer Neg Hx     SOCIAL HISTORY: Social History   Socioeconomic History   Marital status: Divorced    Spouse name: Not on file   Number of children: 1   Years of education: 12   Highest education level: High school graduate  Occupational History   Occupation: Disability  Tobacco Use   Smoking status: Every Day    Current packs/day: 1.50    Average packs/day: 1.5 packs/day for 50.0 years (75.0 ttl pk-yrs)    Types: Cigarettes   Smokeless tobacco: Never   Tobacco comments:    Reports she has cut back to a cartoon that now lasts 2 weeks.   Vaping Use   Vaping status: Former  Substance and Sexual Activity   Alcohol use: Yes    Alcohol/week: 7.0 standard drinks of alcohol    Types: 7  Cans of beer per week    Comment: 1 beer nightly   Drug use: Not Currently    Types: Cocaine    Comment: past opiate and cocaine abuse   Sexual activity: Not Currently    Birth control/protection: Surgical, Post-menopausal  Other Topics Concern   Not on file  Social History Narrative   Divorced, disabled   Lives with parents   Right handed    Retired   Caffeine prn  Social Drivers of Corporate investment banker Strain: Low Risk  (03/14/2022)   Overall Financial Resource Strain (CARDIA)    Difficulty of Paying Living Expenses: Not hard at all  Food Insecurity: No Food Insecurity (05/23/2023)   Hunger Vital Sign    Worried About Running Out of Food in the Last Year: Never true    Ran Out of Food in the Last Year: Never true  Transportation Needs: No Transportation Needs (05/23/2023)   PRAPARE - Administrator, Civil Service (Medical): No    Lack of Transportation (Non-Medical): No  Physical Activity: Sufficiently Active (03/14/2022)   Exercise Vital Sign    Days of Exercise per Week: 5 days    Minutes of Exercise per Session: 30 min  Stress: Stress Concern Present (03/14/2022)   [employer] of Occupational Health - Occupational Stress Questionnaire    Feeling of Stress : To some extent  Social Connections: Socially Integrated (03/14/2022)   Social Connection and Isolation Panel [NHANES]    Frequency of Communication with Friends and Family: More than three times a week    Frequency of Social Gatherings with Friends and Family: More than three times a week    Attends Religious Services: More than 4 times per year    Active Member of [employer] or Organizations: Yes    Attends Engineer, structural: More than 4 times per year    Marital Status: Married    MEDICATIONS:  Current Outpatient Medications  Medication Sig Dispense Refill   Accu-Chek Softclix Lancets lancets Use as instructed to check blood sugar 4 times per day dx code E10.65 400 each 1    Alcohol Swabs (B-D SINGLE USE SWABS REGULAR) PADS USE FOUR TIMES DAILY 400 each 2   aspirin EC 81 MG tablet Take 81 mg by mouth every morning.     B-D ULTRAFINE III SHORT PEN 31G X 8 MM MISC USE TO INJECT WITH PRE-FILLED PEN SYRINGE SUB-Q DAILY 400 each 2   Blood Glucose Calibration (ACCU-CHEK AVIVA) SOLN USE AS DIRECTED 1 each 1   Blood Glucose Monitoring Suppl (ACCU-CHEK AVIVA PLUS) w/Device KIT USE AS DIRECTED 1 kit 0   cetirizine (ZYRTEC) 10 MG tablet Take 1 tablet (10 mg total) by mouth daily. 90 tablet 3   Clobetasol Propionate 0.05 % shampoo Apply topically.     Continuous Blood Gluc Receiver (FREESTYLE LIBRE 3 READER) DEVI 1 Device by Does not apply route continuous. 1 each 0   Continuous Blood Gluc Sensor (FREESTYLE LIBRE 2 SENSOR) MISC Use to check blood sugar daily 2 each 12   Continuous Blood Gluc Sensor (FREESTYLE LIBRE 3 SENSOR) MISC Place 1 sensor on the skin every 14 days. Use to check glucose continuously 6 each 3   cyclobenzaprine (FLEXERIL) 5 MG tablet Take 1 tablet (5 mg total) by mouth 3 (three) times daily as needed for muscle spasms. 30 tablet 1   dapagliflozin propanediol (FARXIGA) 10 MG TABS tablet Take 1 tablet (10 mg total) by mouth daily. 30 tablet 11   donepezil (ARICEPT) 10 MG tablet Take 1 tablet (10 mg total) by mouth at bedtime. 180 tablet 3   fexofenadine (ALLEGRA) 180 MG tablet Take 180 mg by mouth daily.     gabapentin (NEURONTIN) 300 MG capsule TAKE 1 CAPSULE(300 MG) BY MOUTH THREE TIMES DAILY 90 capsule 5   glucose blood (ACCU-CHEK AVIVA PLUS) test strip CHECK BLOOD SUGAR 4 TIMES DAILY 400 strip 3   Glucose Blood (BLOOD GLUCOSE TEST STRIPS)  STRP 1 each by In Vitro route in the morning, at noon, and at bedtime. May substitute to any manufacturer covered by patient's insurance. 100 each 3   GVOKE HYPOPEN 2-PACK 1 MG/0.2ML SOAJ INJECT 1 MG UNDER THE SKIN AS NEEDED FOR LOW BLOOD SUGAR 0.4 mL 2   hyoscyamine (LEVBID) 0.375 MG 12 hr tablet Take 1 tablet (0.375 mg  total) by mouth 2 (two) times daily. 60 tablet 11   insulin glargine (LANTUS) 100 UNIT/ML Solostar Pen Take 5 units in the morning and 5 units at bedtime. 15 mL 4   insulin lispro (HUMALOG KWIKPEN) 100 UNIT/ML KwikPen INJECT SUBCUTANEOUSLY 2-5  UNITS 3 TIMES DAILY AS INSTRUCTED MAXIMUM 20 UNITS A DAY. 30 mL 4   Insulin Pen Needle 32G X 4 MM MISC Use inject insulin tid 300 each 2   Insulin Syringe-Needle U-100 (B-D INSULIN SYRINGE) 31G X 5/16" 0.3 ML MISC Use to administer insulin 10 each 2   lamoTRIgine (LAMICTAL) 100 MG tablet Take 1 tablet (100 mg total) by mouth 2 (two) times daily. 90 tablet 0   Lancets Misc. MISC 1 each by Does not apply route in the morning, at noon, and at bedtime. May substitute to any manufacturer covered by patient's insurance. 100 each 3   lipase/protease/amylase (CREON) 36000 UNITS CPEP capsule Take 2 capsules by mouth three times a day with meals and 1 capsule by mouth twice daily with snacks 240 capsule 11   LORazepam (ATIVAN) 0.5 MG tablet 1-2 tabs 30 - 60 min prior to MRI. Do not drive with this medicine. 4 tablet 0   Magnesium 500 MG TABS Take 1 tablet by mouth daily.     metoprolol succinate (TOPROL XL) 25 MG 24 hr tablet Take 1 tablet (25 mg total) by mouth daily. 90 tablet 3   Multiple Vitamin (MULTIVITAMIN) tablet Take 1 tablet by mouth daily. Reported on 11/22/2015     Omega-3 Fatty Acids (FISH OIL) 1000 MG CAPS Take 1 capsule by mouth daily.     omeprazole (PRILOSEC) 40 MG capsule TAKE 1 CAPSULE(40 MG) BY MOUTH IN THE MORNING AND AT BEDTIME 180 capsule 0   traZODone (DESYREL) 150 MG tablet Take 1 tablet (150 mg total) by mouth at bedtime. 90 tablet 0   triamcinolone cream (KENALOG) 0.1 %      umeclidinium-vilanterol (ANORO ELLIPTA) 62.5-25 MCG/ACT AEPB INHALE 1 PUFF INTO THE LUNGS DAILY 60 each 5   vitamin E 1000 UNIT capsule Take 1,000 Units by mouth daily.     vortioxetine HBr (TRINTELLIX) 5 MG TABS tablet Take 1 tablet (5 mg total) by mouth daily. 15 tablet     rosuvastatin (CRESTOR) 40 MG tablet Take 1 tablet (40 mg total) by mouth daily. 90 tablet 3   No current facility-administered medications for this visit.    PHYSICAL EXAM: Vitals:   09/26/23 1401  BP: 126/70  Pulse: 71  SpO2: 97%  Weight: 108 lb 6.4 oz (49.2 kg)  Height: 5\' 3"  (1.6 m)     Body mass index is 19.2 kg/m.  Wt Readings from Last 3 Encounters:  09/26/23 108 lb 6.4 oz (49.2 kg)  08/01/23 110 lb (49.9 kg)  06/21/23 110 lb (49.9 kg)    General: Well developed, well nourished female in no apparent distress.  HEENT: AT/Marie, no external lesions.  Eyes: Conjunctiva clear and no icterus. Neck: Neck supple  Lungs: Respirations not labored Neurologic: Alert, oriented, normal speech Extremities / Skin: Dry.   Psychiatric: Does not appear depressed  or anxious  Diabetic Foot Exam - Simple   No data filed    LABS Reviewed Lab Results  Component Value Date   HGBA1C 6.8 (A) 09/26/2023   HGBA1C 7.3 (H) 06/21/2023   HGBA1C 7.6 (H) 04/26/2023   Lab Results  Component Value Date   FRUCTOSAMINE 325 (H) 02/28/2021   FRUCTOSAMINE 322 (H) 10/11/2017   FRUCTOSAMINE 296 (H) 01/11/2016   Lab Results  Component Value Date   CHOL 218 (H) 06/22/2023   HDL 110 06/22/2023   LDLCALC 96 06/22/2023   LDLDIRECT 101.7 03/26/2012   TRIG 67 06/22/2023   CHOLHDL 2.0 06/22/2023   Lab Results  Component Value Date   MICRALBCREAT 1.6 06/21/2023   MICRALBCREAT 0.8 07/04/2022   Lab Results  Component Value Date   CREATININE 0.81 06/21/2023   Lab Results  Component Value Date   GFR 74.20 06/21/2023    ASSESSMENT / PLAN  1. Uncontrolled type 1 diabetes mellitus with hyperglycemia (HCC)     Diabetes Mellitus type 1, complicated by diabetic neuropathy/gastroparesis. - Diabetic status / severity: Uncontrolled.  Frequent hypoglycemia.  Lab Results  Component Value Date   HGBA1C 6.8 (A) 09/26/2023    - Hemoglobin A1c goal : <7%  Discussed about controlling  carbohydrates and limiting junk food.  She is ? not a good candidate for insulin pump as she has difficulty with Dexcom sensor and likely will have ? difficulty learning insulin pump operation.  Again discussed about insulin pump therapy, patient wants to stay on the multidose insulin regimen.  Discussed that hypoglycemia can be detrimental to the health including life-threatening.  - Medications: See below.  I) decrease Lantus to 6 units 2 times a day in the morning and at bedtime.  She is currently taking Lantus 6 units in the morning and 9 or 10 units at bedtime. II) Humalog 1-3 units with meals 3 times a day.  If you exercise do not take take humalog for the meal as well. No more than 3 units at a time.  Unless she eats significantly large meal can use up to 5 or 6 units.  Do not take humalog around bedtime for glucose around 200 or less.   She is on Farxiga 10 mg daily by cardiology.  - Home glucose testing: continue freestyle libre CGM check blood sugar as needed. - Discussed/ Gave Hypoglycemia treatment plan.  She uses glucose tablet and juice to correct hypoglycemia.  She has Glucagon Emergency Kit at home.  # Consult : not required at this time.   # Annual urine for microalbuminuria/ creatinine ratio, no microalbuminuria currently. Last  Lab Results  Component Value Date   MICRALBCREAT 1.6 06/21/2023    # Foot check nightly / neuropathy, continue gabapentin as needed.  # Annual dilated diabetic eye exams.   - Diet: Eat reasonable portion sizes to promote a healthy weight.  Advised to avoid frequently snacking. - Life style / activity / exercise: Discussed.  2. Blood pressure  -  BP Readings from Last 1 Encounters:  09/26/23 126/70    - Control is in target.  - No change in current plans.  3. Lipid status / Hyperlipidemia - Last  Lab Results  Component Value Date   LDLCALC 96 06/22/2023   - Continue lovastatin 40 mg daily.  Managed by PCP.  Diagnoses and all  orders for this visit:  Uncontrolled type 1 diabetes mellitus with hyperglycemia (HCC) -     POCT glycosylated hemoglobin (Hb A1C) -  Glucose Blood (BLOOD GLUCOSE TEST STRIPS) STRP; 1 each by In Vitro route in the morning, at noon, and at bedtime. May substitute to any manufacturer covered by patient's insurance. -     Lancets Misc. MISC; 1 each by Does not apply route in the morning, at noon, and at bedtime. May substitute to any manufacturer covered by patient's insurance.     DISPOSITION Follow up in clinic in 3 months suggested.   All questions answered and patient verbalized understanding of the plan.  [place] Kelie Gainey, MD [facility] [address] [place], [address] [place], [place] [address] Phone # [phone]  At least part of this note was generated using voice recognition software. Inadvertent word errors may have occurred, which were not recognized during the proofreading process.

## 2023-09-27 ENCOUNTER — Encounter: Payer: Self-pay | Admitting: Endocrinology

## 2023-09-27 DIAGNOSIS — D225 Melanocytic nevi of trunk: Secondary | ICD-10-CM | POA: Diagnosis not present

## 2023-09-27 DIAGNOSIS — L814 Other melanin hyperpigmentation: Secondary | ICD-10-CM | POA: Diagnosis not present

## 2023-09-27 DIAGNOSIS — L821 Other seborrheic keratosis: Secondary | ICD-10-CM | POA: Diagnosis not present

## 2023-09-27 DIAGNOSIS — L281 Prurigo nodularis: Secondary | ICD-10-CM | POA: Diagnosis not present

## 2023-09-28 ENCOUNTER — Other Ambulatory Visit (HOSPITAL_COMMUNITY): Payer: Self-pay | Admitting: Psychiatry

## 2023-09-28 DIAGNOSIS — F4010 Social phobia, unspecified: Secondary | ICD-10-CM

## 2023-09-28 DIAGNOSIS — F332 Major depressive disorder, recurrent severe without psychotic features: Secondary | ICD-10-CM

## 2023-10-08 ENCOUNTER — Telehealth (HOSPITAL_COMMUNITY): Payer: Self-pay | Admitting: *Deleted

## 2023-10-08 DIAGNOSIS — G4701 Insomnia due to medical condition: Secondary | ICD-10-CM

## 2023-10-08 DIAGNOSIS — F4010 Social phobia, unspecified: Secondary | ICD-10-CM

## 2023-10-08 DIAGNOSIS — F332 Major depressive disorder, recurrent severe without psychotic features: Secondary | ICD-10-CM

## 2023-10-08 MED ORDER — VORTIOXETINE HBR 5 MG PO TABS: 5.0000 mg | ORAL_TABLET | Freq: Every day | ORAL | 0 refills | Status: DC

## 2023-10-08 NOTE — Telephone Encounter (Signed)
 I sent a 30-day prescription to pharmacy.  If patient do not keep her appointment in the future we will refer her out.

## 2023-10-08 NOTE — Telephone Encounter (Signed)
 Pt called requesting a refill of the Trintelix 5 mg every day. Pt received #15 of samples on 08/16/23. Pt No Showed 2 out of 3 recent appointments but has a f/u scheduled for 11/15/23. Ok to send a bridge Rx? Please review and advise.

## 2023-10-10 ENCOUNTER — Other Ambulatory Visit (HOSPITAL_COMMUNITY): Payer: Self-pay

## 2023-10-10 DIAGNOSIS — F332 Major depressive disorder, recurrent severe without psychotic features: Secondary | ICD-10-CM

## 2023-10-10 DIAGNOSIS — F4010 Social phobia, unspecified: Secondary | ICD-10-CM

## 2023-10-10 DIAGNOSIS — G4701 Insomnia due to medical condition: Secondary | ICD-10-CM

## 2023-10-10 MED ORDER — TRAZODONE HCL 150 MG PO TABS: 150.0000 mg | ORAL_TABLET | Freq: Every day | ORAL | 0 refills | Status: DC

## 2023-10-10 MED ORDER — VORTIOXETINE HBR 5 MG PO TABS: 5.0000 mg | ORAL_TABLET | Freq: Every day | ORAL | 0 refills | Status: DC

## 2023-10-10 MED ORDER — LAMOTRIGINE 100 MG PO TABS: 100.0000 mg | ORAL_TABLET | Freq: Two times a day (BID) | ORAL | 0 refills | Status: DC

## 2023-10-22 ENCOUNTER — Other Ambulatory Visit: Payer: Self-pay | Admitting: Internal Medicine

## 2023-10-24 ENCOUNTER — Other Ambulatory Visit: Payer: Self-pay

## 2023-10-24 DIAGNOSIS — E1065 Type 1 diabetes mellitus with hyperglycemia: Secondary | ICD-10-CM

## 2023-10-24 MED ORDER — GVOKE HYPOPEN 2-PACK 1 MG/0.2ML ~~LOC~~ SOAJ: SUBCUTANEOUS | 2 refills | Status: DC

## 2023-10-24 NOTE — Telephone Encounter (Signed)
 Requested Prescriptions   Pending Prescriptions Disp Refills   Glucagon  (GVOKE HYPOPEN  2-PACK) 1 MG/0.2ML SOAJ 0.4 mL 2    Sig: INJECT 1 MG UNDER THE SKIN AS NEEDED FOR LOW BLOOD SUGAR.

## 2023-10-25 ENCOUNTER — Other Ambulatory Visit: Payer: Self-pay

## 2023-10-25 DIAGNOSIS — E1065 Type 1 diabetes mellitus with hyperglycemia: Secondary | ICD-10-CM

## 2023-10-25 MED ORDER — FREESTYLE LIBRE 3 READER DEVI: 1.0000 | 0 refills | Status: DC

## 2023-11-15 ENCOUNTER — Ambulatory Visit (HOSPITAL_BASED_OUTPATIENT_CLINIC_OR_DEPARTMENT_OTHER): Payer: Medicare Other | Admitting: Psychiatry

## 2023-11-15 ENCOUNTER — Other Ambulatory Visit: Payer: Self-pay

## 2023-11-15 ENCOUNTER — Encounter (HOSPITAL_COMMUNITY): Payer: Self-pay | Admitting: Psychiatry

## 2023-11-15 VITALS — BP 170/69 | HR 69 | Ht 63.0 in | Wt 109.0 lb

## 2023-11-15 DIAGNOSIS — G4701 Insomnia due to medical condition: Secondary | ICD-10-CM

## 2023-11-15 DIAGNOSIS — F4010 Social phobia, unspecified: Secondary | ICD-10-CM | POA: Diagnosis not present

## 2023-11-15 DIAGNOSIS — F332 Major depressive disorder, recurrent severe without psychotic features: Secondary | ICD-10-CM | POA: Diagnosis not present

## 2023-11-15 MED ORDER — TRAZODONE HCL 150 MG PO TABS: 150.0000 mg | ORAL_TABLET | Freq: Every day | ORAL | 1 refills | Status: DC

## 2023-11-15 MED ORDER — DULOXETINE HCL 20 MG PO CPEP: 20.0000 mg | ORAL_CAPSULE | Freq: Every day | ORAL | 1 refills | Status: DC

## 2023-11-15 MED ORDER — LAMOTRIGINE 100 MG PO TABS
100.0000 mg | ORAL_TABLET | Freq: Two times a day (BID) | ORAL | 1 refills | Status: DC
Start: 2023-11-15 — End: 2024-01-24

## 2023-11-15 NOTE — Progress Notes (Signed)
 BH MD/PA/NP OP Progress Note  Patient location; office Provider location; office  11/15/2023 3:45 PM Leslie Lane  MRN:  [id number]  Chief Complaint:  Chief Complaint  Patient presents with   Follow-up   Medication Refill   HPI: Patient came today for her follow-up appointment.  She remains restless, anxious and nervous.  We tried Trintellix  but she stopped after having headaches.  Since she stopped the Trintellix  headaches are improved.  She reported having an argument with his brother recently after issues with the mother.  Patient lives with her 13 year old mother.  Patient told she has to leave the house to stay with her friend that night.  Patient also said because her 50 year old best friend died in her sleep.  Patient told she had a lot of health issues but death was unexpected.  She reported irritability, frustration, anxiousness.  She does not comfortable around people.  Her mood remain labile.  She is taking Lamictal  and trazodone .  She has no rash, itching, tremors or shakes.  Her last hemoglobin A1c slightly improved.  It is now 6.8.  Patient has a 21 year old daughter live close by and 87-year-old granddaughter.  Patient is on disability due to chronic health issues.  She would like trazodone  which is helping her sleep.  Visit Diagnosis:    ICD-10-CM   1. Major depressive disorder, recurrent, severe without psychotic features (HCC)  F33.2 traZODone  (DESYREL ) 150 MG tablet    lamoTRIgine  (LAMICTAL ) 100 MG tablet    DULoxetine  (CYMBALTA ) 20 MG capsule    2. Insomnia due to medical condition  G47.01 traZODone  (DESYREL ) 150 MG tablet    DULoxetine  (CYMBALTA ) 20 MG capsule    3. Social anxiety disorder  F40.10 lamoTRIgine  (LAMICTAL ) 100 MG tablet    DULoxetine  (CYMBALTA ) 20 MG capsule        Past Psychiatric History: Reviewed H/O cutting arm at young age.  H/O overdose on diabetes medication at age 53.  H/O drinking and saw Mental health provider. Took Zoloft , Wellbutrin ,  BuSpar , Paxil  and Lexapro .  We tried Trintellix  but caused headache. No history of psychosis.  H/O social anxiety.  Saw Dr. Katrine Parody in the past.  Past Medical History:  Past Medical History:  Diagnosis Date   Abdominal pain 02/28/2016   Abnormal CT scan, bladder    Noted 07/28/13 CT - s/p uro eval 08/2013 [place] -    Acquired trigger finger of left ring finger 02/18/2019   Injected February 18, 2019 and March 2022   Allergic rhinitis    Anemia    Arthritis    Bipolar disorder 07/05/2010   Cataract    Chronic lower back pain 07/05/2010   Chronic neck pain    Diabetic gastroparesis associated with type 1 diabetes mellitus 10/24/2016   Fibromyalgia    Generalized anxiety disorder 04/27/2014   GERD (gastroesophageal reflux disease) 07/05/2010   Hepatitis B virus infection 07/05/2010   History of substance abuse    opiates; cocaine   Hypoglycemia unawareness associated with type 1 diabetes mellitus 11/23/2020   Insomnia 04/13/2015   Major depressive disorder 07/28/2014   Migraine headache 07/05/2010   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  It hasn't happened in a long time."   Smokers'  cough 07/05/2010   Tubular adenoma of colon 2018   Type 1 diabetes mellitus with complication 02/06/2013   WBC decreased 03/27/2017    Past Surgical History:  Procedure Laterality Date   CESAREAN SECTION     COLONOSCOPY     TUBAL LIGATION     UPPER GASTROINTESTINAL ENDOSCOPY      Family Psychiatric History: Reviewed  Family History:  Family History  Problem Relation Age of Onset   Arthritis Mother    Diverticulosis Mother    Kidney disease Mother    Hyperlipidemia Mother    Arthritis Father    Kidney disease  Father    Kidney cancer Father    Bladder Cancer Sister    Colon cancer Sister 19   Celiac disease Sister    Heart attack Brother    Kidney cancer Paternal Aunt    Diabetes Niece    Hyperlipidemia Other    Anxiety disorder Neg Hx    Bipolar disorder Neg Hx    Depression Neg Hx    Breast cancer Neg Hx    Esophageal cancer Neg Hx    Stomach cancer Neg Hx    Rectal cancer Neg Hx     Social History:  Social History   Socioeconomic History   Marital status: Divorced    Spouse name: Not on file   Number of children: 1   Years of education: 12   Highest education level: High school graduate  Occupational History   Occupation: Disability  Tobacco Use   Smoking status: Every Day    Current packs/day: 1.50    Average packs/day: 1.5 packs/day for 50.0 years (75.0 ttl pk-yrs)    Types: Cigarettes   Smokeless tobacco: Never   Tobacco comments:    Reports she has cut back to a cartoon that now lasts 2 weeks.   Vaping Use   Vaping status: Former  Substance and Sexual Activity   Alcohol use: Yes    Alcohol/week: 7.0 standard drinks of alcohol    Types: 7 Cans of beer per week    Comment: 1 beer nightly   Drug use: Not Currently    Types: Cocaine    Comment: past opiate and cocaine abuse   Sexual activity: Not Currently    Birth control/protection: Surgical, Post-menopausal  Other Topics Concern   Not on file  Social History Narrative   Divorced, disabled   Lives with parents   Right handed    Retired   Caffeine prn   Social Drivers of Corporate investment banker Strain: Low Risk  (03/14/2022)   Overall Financial Resource Strain (CARDIA)    Difficulty of Paying Living Expenses: Not hard at all  Food Insecurity: No Food Insecurity (05/23/2023)   Hunger Vital Sign    Worried About Running Out of Food in the Last Year: Never true    Ran Out of Food in the Last Year: Never true  Transportation Needs: No Transportation Needs (05/23/2023)   PRAPARE - Therapist, art (Medical): No    Lack of Transportation (Non-Medical): No  Physical Activity: Sufficiently Active (03/14/2022)   Exercise Vital Sign    Days of Exercise per Week: 5 days    Minutes of Exercise per Session: 30 min  Stress: Stress Concern Present (03/14/2022)   [employer] of Occupational Health - Occupational Stress Questionnaire    Feeling of Stress : To some extent  Social Connections: Socially Integrated (03/14/2022)   Social Connection and Isolation Panel [  NHANES]    Frequency of Communication with Friends and Family: More than three times a week    Frequency of Social Gatherings with Friends and Family: More than three times a week    Attends Religious Services: More than 4 times per year    Active Member of [employer] or Organizations: Yes    Attends Banker Meetings: More than 4 times per year    Marital Status: Married    Allergies:  Allergies  Allergen Reactions   Penicillins Anaphylaxis   Sulfonamide Derivatives Anaphylaxis    Metabolic Disorder Labs: Lab Results  Component Value Date   HGBA1C 6.8 (A) 09/26/2023   No results found for: "PROLACTIN" Lab Results  Component Value Date   CHOL 218 (H) 06/22/2023   TRIG 67 06/22/2023   HDL 110 06/22/2023   CHOLHDL 2.0 06/22/2023   VLDL 16.1 11/10/2022   LDLCALC 96 06/22/2023   LDLCALC 73 11/10/2022   Lab Results  Component Value Date   TSH 0.71 06/07/2021   TSH 1.78 06/13/2018    Therapeutic Level Labs: No results found for: "LITHIUM" No results found for: "VALPROATE" No results found for: "CBMZ"  Current Medications: Current Outpatient Medications  Medication Sig Dispense Refill   Accu-Chek Softclix Lancets lancets Use as instructed to check blood sugar 4 times per day dx code E10.65 400 each 1   Alcohol Swabs (B-D SINGLE USE SWABS REGULAR) PADS USE FOUR TIMES DAILY 400 each 2   aspirin EC 81 MG tablet Take 81 mg by mouth every morning.     B-D ULTRAFINE III SHORT PEN 31G  X 8 MM MISC USE TO INJECT WITH PRE-FILLED PEN SYRINGE SUB-Q DAILY 400 each 2   Blood Glucose Calibration (ACCU-CHEK AVIVA) SOLN USE AS DIRECTED 1 each 1   Blood Glucose Monitoring Suppl (ACCU-CHEK AVIVA PLUS) w/Device KIT USE AS DIRECTED 1 kit 0   cetirizine  (ZYRTEC ) 10 MG tablet Take 1 tablet (10 mg total) by mouth daily. 90 tablet 3   Clobetasol Propionate 0.05 % shampoo Apply topically.     Continuous Blood Gluc Sensor (FREESTYLE LIBRE 2 SENSOR) MISC Use to check blood sugar daily 2 each 12   Continuous Blood Gluc Sensor (FREESTYLE LIBRE 3 SENSOR) MISC Place 1 sensor on the skin every 14 days. Use to check glucose continuously 6 each 3   Continuous Glucose Receiver (FREESTYLE LIBRE 3 READER) DEVI 1 Device by Does not apply route continuous. 1 each 0   dapagliflozin  propanediol (FARXIGA ) 10 MG TABS tablet Take 1 tablet (10 mg total) by mouth daily. 30 tablet 11   donepezil  (ARICEPT ) 10 MG tablet Take 1 tablet (10 mg total) by mouth at bedtime. 180 tablet 3   fexofenadine (ALLEGRA) 180 MG tablet Take 180 mg by mouth daily.     Glucagon  (GVOKE HYPOPEN  2-PACK) 1 MG/0.2ML SOAJ INJECT 1 MG UNDER THE SKIN AS NEEDED FOR LOW BLOOD SUGAR. 0.4 mL 2   glucose blood (ACCU-CHEK AVIVA PLUS) test strip CHECK BLOOD SUGAR 4 TIMES DAILY 400 strip 3   Glucose Blood (BLOOD GLUCOSE TEST STRIPS) STRP 1 each by In Vitro route in the morning, at noon, and at bedtime. May substitute to any manufacturer covered by patient's insurance. 100 each 3   hyoscyamine  (LEVBID) 0.375 MG 12 hr tablet Take 1 tablet (0.375 mg total) by mouth 2 (two) times daily. 60 tablet 11   insulin  glargine (LANTUS ) 100 UNIT/ML Solostar Pen Take 5 units in the morning and 5 units at bedtime. 15  mL 4   insulin  lispro (HUMALOG  KWIKPEN) 100 UNIT/ML KwikPen INJECT SUBCUTANEOUSLY 2-5  UNITS 3 TIMES DAILY AS INSTRUCTED MAXIMUM 20 UNITS A DAY. 30 mL 4   Insulin  Pen Needle 32G X 4 MM MISC Use inject insulin  tid 300 each 2   Insulin  Syringe-Needle U-100  (B-D INSULIN  SYRINGE) 31G X 5/16" 0.3 ML MISC Use to administer insulin  10 each 2   lamoTRIgine  (LAMICTAL ) 100 MG tablet Take 1 tablet (100 mg total) by mouth 2 (two) times daily. 60 tablet 0   lipase/protease/amylase (CREON ) 36000 UNITS CPEP capsule Take 2 capsules by mouth three times a day with meals and 1 capsule by mouth twice daily with snacks 240 capsule 11   Magnesium  500 MG TABS Take 1 tablet by mouth daily.     metoprolol  succinate (TOPROL  XL) 25 MG 24 hr tablet Take 1 tablet (25 mg total) by mouth daily. 90 tablet 3   Multiple Vitamin (MULTIVITAMIN) tablet Take 1 tablet by mouth daily. Reported on 11/22/2015     Omega-3 Fatty Acids (FISH OIL) 1000 MG CAPS Take 1 capsule by mouth daily.     omeprazole  (PRILOSEC) 40 MG capsule TAKE 1 CAPSULE(40 MG) BY MOUTH IN THE MORNING AND AT BEDTIME 180 capsule 0   rosuvastatin  (CRESTOR ) 40 MG tablet Take 1 tablet (40 mg total) by mouth daily. 90 tablet 3   traZODone  (DESYREL ) 150 MG tablet Take 1 tablet (150 mg total) by mouth at bedtime. 30 tablet 0   triamcinolone  cream (KENALOG) 0.1 %      umeclidinium-vilanterol (ANORO ELLIPTA ) 62.5-25 MCG/ACT AEPB INHALE 1 PUFF INTO THE LUNGS DAILY 60 each 5   cyclobenzaprine  (FLEXERIL ) 5 MG tablet Take 1 tablet (5 mg total) by mouth 3 (three) times daily as needed for muscle spasms. (Patient not taking: Reported on 11/15/2023) 30 tablet 1   gabapentin  (NEURONTIN ) 300 MG capsule TAKE 1 CAPSULE(300 MG) BY MOUTH THREE TIMES DAILY (Patient not taking: Reported on 11/15/2023) 90 capsule 5   LORazepam  (ATIVAN ) 0.5 MG tablet 1-2 tabs 30 - 60 min prior to MRI. Do not drive with this medicine. (Patient not taking: Reported on 11/15/2023) 4 tablet 0   vitamin E 1000 UNIT capsule Take 1,000 Units by mouth daily. (Patient not taking: Reported on 11/15/2023)     vortioxetine  HBr (TRINTELLIX ) 5 MG TABS tablet Take 1 tablet (5 mg total) by mouth daily. (Patient not taking: Reported on 11/15/2023) 30 tablet 0   No current  facility-administered medications for this visit.     Musculoskeletal: Strength & Muscle Tone: within normal limits Gait & Station: normal Patient leans: N/A  Psychiatric Specialty Exam: Review of Systems  Blood pressure (!) 170/69, pulse 69, height 5\' 3"  (1.6 m), weight 109 lb (49.4 kg).Body mass index is 19.31 kg/m.  General Appearance: casual and wearing Mask  Eye Contact:  Fair  Speech:  fast  Volume:  Normal  Mood:  Anxious and Depressed  Affect:  Labile  Thought Process:  Descriptions of Associations: Intact  Orientation:  Full (Time, Place, and Person)  Thought Content: Rumination   Suicidal Thoughts:  No  Homicidal Thoughts:  No  Memory:  Immediate;   Good Recent;   Fair Remote;   Fair  Judgement:  Intact  Insight:  Shallow  Psychomotor Activity:  Restlessness  Concentration:  Concentration: Fair and Attention Span: Fair  Recall:  [employer] of Knowledge: Fair  Language: Good  Akathisia:  No  Handed:  Right  AIMS (if indicated): not  done  Assets:  Communication Skills Desire for Improvement Housing Social Support Transportation  ADL's:  Intact  Cognition: WNL  Sleep:  Too much   Screenings: AUDIT    Flowsheet Row Clinical Support from 03/14/2022 in [facility] [place]  Alcohol Use Disorder Identification Test Final Score (AUDIT) 4      GAD-7    Flowsheet Row [facility] Office Visit from 12/16/2020 in [facility] Office Visit from 06/17/2019 in [facility] Office Visit from 11/27/2017 in [facility]  Total GAD-7 Score 9 17 19 18       Mini-Mental    Flowsheet Row Office Visit from 08/30/2023 in [facility] Office Visit from 02/27/2023 in [facility] [facility] Office Visit from 08/23/2022 in [facility] Office Visit from 12/03/2020  in [facility] Office Visit from 05/30/2017 in [facility]  Total Score (max 30 points ) 30 28 30 22 27       PHQ2-9    Flowsheet Row [facility] Visit [facility] 08/16/2023 [facility] Office Visit from 06/21/2023 in [facility] Office Visit from 12/05/2022 in [facility] [place] [facility] Visit from 10/[facility]/2022 [facility] Office Visit from 04[facility]06/2022 [facility]  PHQ-2 Total Score 2 0 0 3 6  PHQ-9 Total Score 8 0 -- 19 24      Flowsheet Row ED from 12/24/2022 in [facility] at [facility] Office Visit from 10/05/2022 in [facility] ED from 06/14/2022 in [facility] Department at [facility]  C-SSRS RISK CATEGORY No Risk Error: Q3, 4, or 5 should not be populated when Q2 is No No Risk        Assessment and Plan:  I reviewed blood work results and current medication.  She is not taking Trintellix  because of headaches.  She is taking a low-dose Lamictal  100 mg twice a day.  However given the history of headaches I will refrain from increasing the dose of Lamictal .  In the past she had tried Cymbalta  but unclear why she discontinued.  Will trial low-dose Cymbalta  20 mg daily to help her anxiety and nervousness.  Patient has chronic pain and she is wondering if that can help her chronic pain also.  In the past she had tried Zoloft , Wellbutrin , BuSpar , Paxil , Lexapro  and also Seroquel and recently Trintellix .  Patient not interested in therapy as she had tried multiple therapist in the past and did not connect with them.  I recommend to try Cymbalta  20 mg daily and if able to tolerate then we may optimize the dose in the future.  Recommend to call us  back if she is any question or any concern.  Follow-up in  6 weeks.  Collaboration of Care: Collaboration of Care: Other provider involved in patient's care AEB notes are available in epic to review  Patient/Guardian was advised Release of Information must be obtained prior to any record release in order to collaborate their care with an outside provider. Patient/Guardian was advised if they have not already done so to contact the registration department to sign all necessary forms in order for us  to release information regarding their care.   Consent: Patient/Guardian gives verbal consent for treatment and  assignment of benefits for services provided during this visit. Patient/Guardian expressed understanding and agreed to proceed.   I spent 23 minutes face-to-face with the patient during this encounter.  Arturo Late, MD 11/15/2023, 3:45 PM

## 2023-11-30 ENCOUNTER — Other Ambulatory Visit: Payer: Self-pay

## 2023-11-30 DIAGNOSIS — E1065 Type 1 diabetes mellitus with hyperglycemia: Secondary | ICD-10-CM

## 2023-11-30 MED ORDER — GVOKE HYPOPEN 2-PACK 1 MG/0.2ML ~~LOC~~ SOAJ: SUBCUTANEOUS | 2 refills | Status: DC

## 2023-12-02 ENCOUNTER — Other Ambulatory Visit: Payer: Self-pay | Admitting: Internal Medicine

## 2023-12-11 ENCOUNTER — Other Ambulatory Visit: Payer: Self-pay | Admitting: Physician Assistant

## 2023-12-11 ENCOUNTER — Other Ambulatory Visit: Payer: Self-pay | Admitting: *Deleted

## 2023-12-11 MED ORDER — METOPROLOL SUCCINATE ER 25 MG PO TB24: 25.0000 mg | ORAL_TABLET | Freq: Every day | ORAL | 0 refills | Status: DC

## 2023-12-20 DIAGNOSIS — E7800 Pure hypercholesterolemia, unspecified: Secondary | ICD-10-CM | POA: Insufficient documentation

## 2023-12-20 DIAGNOSIS — I2510 Atherosclerotic heart disease of native coronary artery without angina pectoris: Secondary | ICD-10-CM | POA: Insufficient documentation

## 2023-12-20 DIAGNOSIS — I342 Nonrheumatic mitral (valve) stenosis: Secondary | ICD-10-CM | POA: Insufficient documentation

## 2023-12-20 NOTE — Progress Notes (Unsigned)
 OFFICE NOTE:    Date:  12/21/2023  ID:  Leslie Lane, DOB 1955-12-03, MRN [id number] PCP: Adelia Homestead, MD  [facility] Providers Cardiologist:  Oneil Bigness, MD       Patient Profile:  Coronary artery Ca2+ Mitral stenosis  TTE 01/08/23: EF 55-60, no RWMA, mild LVH, Gr 1 DD, NL RVSF, NL PASP, mod LAE, mod MS, mean MV 7 mmHg, mild AI Diabetes mellitus Type 1 Hx of cocaine use Tobacco use  Aortic atherosclerosis / Aortic atheroma on TTE in 01/2023       Discussed the use of AI scribe software for clinical note transcription with the patient, who gave verbal consent to proceed. History of Present Illness Leslie Lane is a 68 y.o. female who returns for follow up of CAD, MS. She was last seen by Dr. Alois Arnt in 06/2023.  She has not had chest discomfort, pain, pressure, tightness, or heaviness in her chest. No shortness of breath, dizziness, or swelling in her legs. She is able to perform activities such as walking and going up and down stairs without difficulty, although picking up heavy objects is challenging due to fibromyalgia. She does note leg discomfort. She has not had her legs tested for blockages but acknowledges the risk due to her diabetes. She smokes and acknowledges the need to quit, citing nervousness as a barrier. She has attempted to quit in the past but only managed for two weeks. She remains active, walking outside and performing household activities.   ROS-See HPI    Studies Reviewed:      Results LABS Total cholesterol: 218 (06/22/2023) HDL: 110 (06/22/2023) LDL: 96 (06/22/2023) Triglycerides: 67 (06/22/2023) Creatinine: 0.81 (06/22/2023) Potassium: 4.2 (06/22/2023)  Risk Assessment/Calculations:          Physical Exam:  VS:  BP 132/74   Pulse 66   Ht 5' 3.5 (1.613 m)   Wt 111 lb (50.3 kg)   SpO2 99%   BMI 19.35 kg/m        Wt Readings from Last 3 Encounters:  12/21/23 111 lb (50.3 kg)  09/26/23 108 lb 6.4 oz (49.2  kg)  08/01/23 110 lb (49.9 kg)    Constitutional:      Appearance: Healthy appearance. Not in distress.  Neck:     Vascular: JVD normal.  Pulmonary:     Breath sounds: Normal breath sounds. No wheezing. No rales.  Cardiovascular:     Normal rate. Regular rhythm.     Murmurs: There is a grade 2/6 systolic murmur at the URSB and ULSB.  Edema:    Peripheral edema absent.  Abdominal:     Palpations: Abdomen is soft.        Assessment and Plan:    Assessment & Plan Coronary artery calcification Coronary artery calcification noted on prior scan. Asymptomatic without angina. Emphasized lifestyle modifications to reduce cardiovascular risk and discussed the importance of further testing if symptoms develop. - Continue aspirin 81 mg daily. - Continue Rosuvastatin  40 mg once daily  - Follow up in one year unless symptoms develop. Pure hypercholesterolemia Hyperlipidemia with a target LDL cholesterol below 70 mg/dL.  She is not fasting today.  - Continue Crestor  40 mg daily. - Obtain direct LDL and ALT today. Nonrheumatic mitral (valve) stenosis Moderate mitral stenosis with a mean mitral valve gradient of 7 mmHg as per echocardiogram from July 2024. Asymptomatic with no significant worsening symptoms such as dyspnea or chest discomfort. - Arrange follow-up echocardiogram in  July 2025. Claudication [facility]) She notes leg pain at times. She has a lot of symptoms related to neuropathy as well. Her limb at risk score is high. She is a smoker and has Type 1 diabetes. - Check ABIs Tobacco abuse I have recommended cessation.         Dispo:  Return in about 1 year (around 12/20/2024) for Routine Follow Up w/ Dr. Rolm Clos, or Marlyse Single, PA-C.  Signed, Marlyse Single, PA-C

## 2023-12-21 ENCOUNTER — Encounter: Payer: Self-pay | Admitting: Physician Assistant

## 2023-12-21 ENCOUNTER — Ambulatory Visit: Attending: Physician Assistant | Admitting: Physician Assistant

## 2023-12-21 VITALS — BP 132/74 | HR 66 | Ht 63.5 in | Wt 111.0 lb

## 2023-12-21 DIAGNOSIS — I739 Peripheral vascular disease, unspecified: Secondary | ICD-10-CM | POA: Diagnosis not present

## 2023-12-21 DIAGNOSIS — Z720 Tobacco use: Secondary | ICD-10-CM | POA: Diagnosis not present

## 2023-12-21 DIAGNOSIS — I2510 Atherosclerotic heart disease of native coronary artery without angina pectoris: Secondary | ICD-10-CM

## 2023-12-21 DIAGNOSIS — E7800 Pure hypercholesterolemia, unspecified: Secondary | ICD-10-CM | POA: Diagnosis not present

## 2023-12-21 DIAGNOSIS — I342 Nonrheumatic mitral (valve) stenosis: Secondary | ICD-10-CM

## 2023-12-21 NOTE — Assessment & Plan Note (Signed)
 Hyperlipidemia with a target LDL cholesterol below 70 mg/dL.  She is not fasting today.  - Continue Crestor  40 mg daily. - Obtain direct LDL and ALT today.

## 2023-12-21 NOTE — Assessment & Plan Note (Signed)
 Coronary artery calcification noted on prior scan. Asymptomatic without angina. Emphasized lifestyle modifications to reduce cardiovascular risk and discussed the importance of further testing if symptoms develop. - Continue aspirin 81 mg daily. - Continue Rosuvastatin  40 mg once daily  - Follow up in one year unless symptoms develop.

## 2023-12-21 NOTE — Assessment & Plan Note (Signed)
 Moderate mitral stenosis with a mean mitral valve gradient of 7 mmHg as per echocardiogram from July 2024. Asymptomatic with no significant worsening symptoms such as dyspnea or chest discomfort. - Arrange follow-up echocardiogram in July 2025.

## 2023-12-21 NOTE — Patient Instructions (Signed)
 Medication Instructions:  Your physician recommends that you continue on your current medications as directed. Please refer to the Current Medication list given to you today.  *If you need a refill on your cardiac medications before your next appointment, please call your pharmacy*  Lab Work: TODAY:  DIRECT LDL & ALT  If you have labs (blood work) drawn today and your tests are completely normal, you will receive your results only by: MyChart Message (if you have MyChart) OR A paper copy in the mail If you have any lab test that is abnormal or we need to change your treatment, we will call you to review the results.  Testing/Procedures: Your physician has requested that you have an echocardiogram. Echocardiography is a painless test that uses sound waves to create images of your heart. It provides your doctor with information about the size and shape of your heart and how well your heart's chambers and valves are working. This procedure takes approximately one hour. There are no restrictions for this procedure. Please do NOT wear cologne, perfume, aftershave, or lotions (deodorant is allowed). Please arrive 15 minutes prior to your appointment time.  Please note: We ask at that you not bring children with you during ultrasound (echo/ vascular) testing. Due to room size and safety concerns, children are not allowed in the ultrasound rooms during exams. Our front office staff cannot provide observation of children in our lobby area while testing is being conducted. An adult accompanying a patient to their appointment will only be allowed in the ultrasound room at the discretion of the ultrasound technician under special circumstances. We apologize for any inconvenience.  Your physician recommends you have Vascular Ultrasound ABI's    Follow-Up: At [facility], you and your health needs are our priority.  As part of our continuing mission to provide you with exceptional heart care,  our providers are all part of one team.  This team includes your primary Cardiologist (physician) and Advanced Practice Providers or APPs (Physician Assistants and Nurse Practitioners) who all work together to provide you with the care you need, when you need it.  Your next appointment:   12 month(s)  Provider:   Oneil Bigness, MD    We recommend signing up for the patient portal called MyChart.  Sign up information is provided on this After Visit Summary.  MyChart is used to connect with patients for Virtual Visits (Telemedicine).  Patients are able to view lab/test results, encounter notes, upcoming appointments, etc.  Non-urgent messages can be sent to your provider as well.   To learn more about what you can do with MyChart, go to [email or web address].   Other Instructions

## 2023-12-22 LAB — ALT: ALT: 17 IU/L (ref 0–32)

## 2023-12-22 LAB — LDL CHOLESTEROL, DIRECT: LDL Direct: 87 mg/dL (ref 0–99)

## 2023-12-23 ENCOUNTER — Ambulatory Visit: Payer: Self-pay | Admitting: Physician Assistant

## 2023-12-23 DIAGNOSIS — I342 Nonrheumatic mitral (valve) stenosis: Secondary | ICD-10-CM

## 2023-12-23 DIAGNOSIS — E7800 Pure hypercholesterolemia, unspecified: Secondary | ICD-10-CM

## 2023-12-24 ENCOUNTER — Encounter: Payer: Self-pay | Admitting: Endocrinology

## 2023-12-24 ENCOUNTER — Other Ambulatory Visit: Payer: Self-pay

## 2023-12-24 ENCOUNTER — Ambulatory Visit: Admitting: Endocrinology

## 2023-12-24 ENCOUNTER — Ambulatory Visit: Payer: Self-pay | Admitting: Endocrinology

## 2023-12-24 ENCOUNTER — Other Ambulatory Visit: Payer: Self-pay | Admitting: Internal Medicine

## 2023-12-24 VITALS — BP 112/60 | HR 75 | Resp 20 | Ht 63.5 in | Wt 109.8 lb

## 2023-12-24 DIAGNOSIS — E10649 Type 1 diabetes mellitus with hypoglycemia without coma: Secondary | ICD-10-CM

## 2023-12-24 DIAGNOSIS — Z1231 Encounter for screening mammogram for malignant neoplasm of breast: Secondary | ICD-10-CM

## 2023-12-24 LAB — POCT GLYCOSYLATED HEMOGLOBIN (HGB A1C): Hemoglobin A1C: 6.2 % — AB (ref 4.0–5.6)

## 2023-12-24 MED ORDER — FREESTYLE LIBRE 3 PLUS SENSOR MISC: 1.0000 | 3 refills | Status: AC

## 2023-12-24 NOTE — Progress Notes (Signed)
 Outpatient Endocrinology Note [name], MD  12/24/23  Patient's Name: Leslie Lane    DOB: [birth year]    MRN: [id number]                                                    REASON OF VISIT: Follow up of type 1 diabetes mellitus  PCP: Rollene Almarie LABOR, MD  HISTORY OF PRESENT ILLNESS:   Leslie Lane is a 68 y.o. old female with past medical history listed below, is here for follow up for type 1 diabetes mellitus.   Pertinent Diabetes History: No was diagnosed with type 1 diabetes mellitus in 1967.  Chronic Diabetes Complications : Retinopathy: no. Last ophthalmology exam was done on annually, following with ophthalmology regularly.  Nephropathy: no /microalbuminuria present Peripheral neuropathy: yes, on gabapentin  as needed.  Cymbalta . Coronary artery disease: no Stroke: no She has gastroparesis. Followed by gastroenterologist for abdominal pain and pancreatic insufficiency.  Relevant comorbidities and cardiovascular risk factors: Obesity: no Body mass index is 19.15 kg/m.  Hypertension: Yes  Hyperlipidemia : Yes, on statin   Current / Home Diabetic regimen includes: Lantus  6 units in the morning and 9 units at bedtime.  Humalog  1 -2  units with breakfast and lunch and 2-4 units units with supper.  She takes variable doses.  She is on Farxiga  10 mg daily by cardiology.  Prior diabetic medications:  Glycemic data:    She was using freestyle libre 3+ CGM, currently not using.  She has been checking blood sugar with her meter, not able to download in the clinic today, blood sugar reviewed from the meter as follows, random hypoglycemia and hyperglycemia as follows.  66, 48, 114, 80, 28, 49, 70, 64, 54, 157, 230, 63, 189, 385, 304, 129.   Hypoglycemia: Patient has frequent hypoglycemic episodes. Patient has hypoglycemia ? awareness, has a glucagon  ER kit.  Glucose tablets.  Factors modifying glucose control: 1.  Diabetic diet assessment: likes to eat junk  food.   2.  Staying active or exercising: No formal exercise.  Physically active and walks multiple times a day.  3.  Medication compliance: compliant all of the time.  Interval history  Patient reports she had pain on the arm, freestyle libre sensor fell off has not been able to refill sensor after that.  She has been checking blood sugar at glucometer, as reviewed and noted above.  Diabetes regimen reviewed and noted above.  She continues to take Lantus  9 units at bedtime despite decreasing to 6 units at last visit.  He still has random hypoglycemia.  Hemoglobin A1c 6.2%.  Patient reports she is having a lot of stress with family member hospitalized.  No other complaints today.  REVIEW OF SYSTEMS As per history of present illness.   PAST MEDICAL HISTORY: Past Medical History:  Diagnosis Date   Abdominal pain 02/28/2016   Abnormal CT scan, bladder    Noted 07/28/13 CT - s/p uro eval 08/2013 [place] -    Acquired trigger finger of left ring finger 02/18/2019   Injected February 18, 2019 and March 2022   Allergic rhinitis    Anemia    Arthritis    Bipolar disorder 07/05/2010   Cataract    Chronic lower back pain 07/05/2010   Chronic neck pain    Diabetic gastroparesis associated with type  1 diabetes mellitus 10/24/2016   Fibromyalgia    Generalized anxiety disorder 04/27/2014   GERD (gastroesophageal reflux disease) 07/05/2010   Hepatitis B virus infection 07/05/2010   History of substance abuse    opiates; cocaine   Hypoglycemia unawareness associated with type 1 diabetes mellitus 11/23/2020   Insomnia 04/13/2015   Major depressive disorder 07/28/2014   Migraine headache 07/05/2010   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  It hasn't happened in a long time.   Smokers' cough 07/05/2010   Tubular adenoma of colon 2018   Type 1 diabetes mellitus with complication 02/06/2013   WBC decreased 03/27/2017    PAST SURGICAL HISTORY: Past Surgical History:  Procedure Laterality Date   CESAREAN SECTION     COLONOSCOPY     TUBAL LIGATION     UPPER GASTROINTESTINAL ENDOSCOPY      ALLERGIES: Allergies  Allergen Reactions   Penicillins Anaphylaxis   Sulfonamide Derivatives Anaphylaxis    FAMILY HISTORY:  Family History  Problem Relation Age of Onset   Arthritis Mother    Diverticulosis Mother    Kidney disease Mother    Hyperlipidemia Mother    Arthritis Father    Kidney disease Father    Kidney cancer Father    Bladder Cancer Sister    Colon cancer Sister 53   Celiac disease Sister    Heart attack Brother    Kidney cancer Paternal Aunt    Diabetes Niece    Hyperlipidemia Other    Anxiety disorder Neg Hx    Bipolar disorder Neg Hx    Depression Neg Hx    Breast cancer Neg Hx    Esophageal cancer Neg Hx    Stomach cancer Neg Hx    Rectal cancer Neg Hx     SOCIAL HISTORY: Social History   Socioeconomic History   Marital status: Divorced    Spouse name: Not on file   Number of children: 1   Years of education: 12   Highest education level: High school graduate  Occupational History   Occupation: Disability  Tobacco Use   Smoking status: Every Day    Current packs/day: 1.50    Average packs/day: 1.5 packs/day for 50.0 years (75.0 ttl pk-yrs)    Types: Cigarettes   Smokeless tobacco: Never   Tobacco comments:    Reports she has cut back to a cartoon that now lasts 2 weeks.   Vaping Use   Vaping status: Former  Substance and Sexual Activity   Alcohol use: Yes    Alcohol/week: 7.0 standard drinks of alcohol    Types: 7 Cans of beer per week    Comment: 1 beer nightly   Drug use: Not Currently    Types: Cocaine    Comment: past opiate and cocaine abuse    Sexual activity: Not Currently    Birth control/protection: Surgical, Post-menopausal  Other Topics Concern   Not on file  Social History Narrative   Divorced, disabled   Lives with parents   Right handed    Retired   Caffeine prn   Social Drivers of Health   Financial Resource Strain: Low Risk  (03/14/2022)   Overall Financial Resource Strain (CARDIA)    Difficulty of  Paying Living Expenses: Not hard at all  Food Insecurity: No Food Insecurity (05/23/2023)   Hunger Vital Sign    Worried About Running Out of Food in the Last Year: Never true    Ran Out of Food in the Last Year: Never true  Transportation Needs: No Transportation Needs (05/23/2023)   PRAPARE - Administrator, Civil Service (Medical): No    Lack of Transportation (Non-Medical): No  Physical Activity: Sufficiently Active (03/14/2022)   Exercise Vital Sign    Days of Exercise per Week: 5 days    Minutes of Exercise per Session: 30 min  Stress: Stress Concern Present (03/14/2022)   [employer] of Occupational Health - Occupational Stress Questionnaire    Feeling of Stress : To some extent  Social Connections: Socially Integrated (03/14/2022)   Social Connection and Isolation Panel    Frequency of Communication with Friends and Family: More than three times a week    Frequency of Social Gatherings with Friends and Family: More than three times a week    Attends Religious Services: More than 4 times per year    Active Member of [employer] or Organizations: Yes    Attends Engineer, structural: More than 4 times per year    Marital Status: Married    MEDICATIONS:  Current Outpatient Medications  Medication Sig Dispense Refill   Accu-Chek Softclix Lancets lancets Use as instructed to check blood sugar 4 times per day dx code E10.65 400 each 1   Alcohol Swabs (B-D SINGLE USE SWABS REGULAR) PADS USE FOUR TIMES DAILY 400 each 2   aspirin EC 81 MG tablet Take 81 mg by mouth every morning.     B-D  ULTRAFINE III SHORT PEN 31G X 8 MM MISC USE TO INJECT WITH PRE-FILLED PEN SYRINGE SUB-Q DAILY 400 each 2   Blood Glucose Calibration (ACCU-CHEK AVIVA) SOLN USE AS DIRECTED 1 each 1   Blood Glucose Monitoring Suppl (ACCU-CHEK AVIVA PLUS) w/Device KIT USE AS DIRECTED 1 kit 0   cetirizine  (ZYRTEC ) 10 MG tablet Take 1 tablet (10 mg total) by mouth daily. 90 tablet 3   Clobetasol Propionate 0.05 % shampoo Apply topically.     Continuous Glucose Sensor (FREESTYLE LIBRE 3 PLUS SENSOR) MISC 1 each by Does not apply route continuous. Change every 15 days. 6 each 3   cyclobenzaprine  (FLEXERIL ) 5 MG tablet Take 1 tablet (5 mg total) by mouth 3 (three) times daily as needed for muscle spasms. 30 tablet 1   dapagliflozin  propanediol (FARXIGA ) 10 MG TABS tablet Take 1 tablet (10 mg total) by mouth daily. 30 tablet 11   donepezil  (ARICEPT ) 10 MG tablet Take 1 tablet (10 mg total) by mouth at bedtime. 180 tablet 3   DULoxetine  (CYMBALTA ) 20 MG capsule Take 1 capsule (20 mg total) by mouth daily. 30 capsule 1   fexofenadine (ALLEGRA) 180 MG tablet Take 180 mg by mouth daily.     Glucagon  (GVOKE HYPOPEN  2-PACK) 1 MG/0.2ML SOAJ INJECT 1 MG UNDER THE SKIN AS NEEDED FOR LOW BLOOD SUGAR. 0.4 mL 2   glucose blood (ACCU-CHEK AVIVA PLUS) test strip CHECK BLOOD SUGAR 4 TIMES DAILY 400 strip 3   Glucose Blood (BLOOD GLUCOSE TEST STRIPS) STRP 1 each by In Vitro route in the morning, at noon, and at bedtime. May substitute to any manufacturer covered by patient's insurance. 100 each 3   hyoscyamine  (LEVBID ) 0.375 MG 12 hr tablet Take 1 tablet (0.375 mg total) by mouth 2 (two)  times daily. 60 tablet 11   insulin  glargine (LANTUS ) 100 UNIT/ML Solostar Pen Take 5 units in the morning and 5 units at bedtime. (Patient taking differently: Take 5 units in the morning and 5 units at bedtime. Patient reports taking 6 units BID) 15 mL 4   insulin  lispro (HUMALOG  KWIKPEN) 100 UNIT/ML KwikPen INJECT SUBCUTANEOUSLY 2-5  UNITS 3 TIMES DAILY  AS INSTRUCTED MAXIMUM 20 UNITS A DAY. 30 mL 4   Insulin  Pen Needle 32G X 4 MM MISC Use inject insulin  tid 300 each 2   Insulin  Syringe-Needle U-100 (B-D INSULIN  SYRINGE) 31G X 5/16 0.3 ML MISC Use to administer insulin  10 each 2   lamoTRIgine  (LAMICTAL ) 100 MG tablet Take 1 tablet (100 mg total) by mouth 2 (two) times daily. 60 tablet 1   lipase/protease/amylase (CREON ) 36000 UNITS CPEP capsule Take 2 capsules by mouth three times a day with meals and 1 capsule by mouth twice daily with snacks 240 capsule 11   Magnesium  500 MG TABS Take 1 tablet by mouth daily.     metoprolol  succinate (TOPROL  XL) 25 MG 24 hr tablet Take 1 tablet (25 mg total) by mouth daily. 30 tablet 0   Multiple Vitamin (MULTIVITAMIN) tablet Take 1 tablet by mouth daily. Reported on 11/22/2015     Omega-3 Fatty Acids (FISH OIL) 1000 MG CAPS Take 1 capsule by mouth daily.     omeprazole  (PRILOSEC) 40 MG capsule TAKE 1 CAPSULE(40 MG) BY MOUTH IN THE MORNING AND AT BEDTIME 180 capsule 0   rosuvastatin  (CRESTOR ) 40 MG tablet Take 1 tablet (40 mg total) by mouth daily. 90 tablet 3   traZODone  (DESYREL ) 150 MG tablet Take 1 tablet (150 mg total) by mouth at bedtime. 30 tablet 1   triamcinolone  cream (KENALOG) 0.1 %      umeclidinium-vilanterol (ANORO ELLIPTA ) 62.5-25 MCG/ACT AEPB INHALE 1 PUFF INTO THE LUNGS DAILY 60 each 5   vitamin E 1000 UNIT capsule Take 1,000 Units by mouth daily.     Continuous Glucose Receiver (FREESTYLE LIBRE 3 READER) DEVI 1 Device by Does not apply route continuous. (Patient not taking: Reported on 12/24/2023) 1 each 0   gabapentin  (NEURONTIN ) 300 MG capsule TAKE 1 CAPSULE(300 MG) BY MOUTH THREE TIMES DAILY (Patient not taking: Reported on 12/24/2023) 90 capsule 5   LORazepam  (ATIVAN ) 0.5 MG tablet 1-2 tabs 30 - 60 min prior to MRI. Do not drive with this medicine. (Patient not taking: Reported on 12/24/2023) 4 tablet 0   No current facility-administered medications for this visit.    PHYSICAL  EXAM: Vitals:   12/24/23 1410  BP: 112/60  Pulse: 75  Resp: 20  SpO2: 93%  Weight: 109 lb 12.8 oz (49.8 kg)  Height: 5' 3.5 (1.613 m)      Body mass index is 19.15 kg/m.  Wt Readings from Last 3 Encounters:  12/24/23 109 lb 12.8 oz (49.8 kg)  12/21/23 111 lb (50.3 kg)  09/26/23 108 lb 6.4 oz (49.2 kg)    General: Well developed, well nourished female in no apparent distress.  HEENT: AT/[place], no external lesions.  Eyes: Conjunctiva clear and no icterus. Neck: Neck supple  Lungs: Respirations not labored Neurologic: Alert, oriented, normal speech Extremities / Skin: Dry.   Psychiatric: Does not appear depressed or anxious  Diabetic Foot Exam - Simple   No data filed    LABS Reviewed Lab Results  Component Value Date   HGBA1C 6.2 (A) 12/24/2023   HGBA1C 6.8 (A) 09/26/2023  HGBA1C 7.3 (H) 06/21/2023   Lab Results  Component Value Date   FRUCTOSAMINE 325 (H) 02/28/2021   FRUCTOSAMINE 322 (H) 10/11/2017   FRUCTOSAMINE 296 (H) 01/11/2016   Lab Results  Component Value Date   CHOL 218 (H) 06/22/2023   HDL 110 06/22/2023   LDLCALC 96 06/22/2023   LDLDIRECT 87 12/21/2023   TRIG 67 06/22/2023   CHOLHDL 2.0 06/22/2023   Lab Results  Component Value Date   MICRALBCREAT 1.6 06/21/2023   MICRALBCREAT 0.8 07/04/2022   Lab Results  Component Value Date   CREATININE 0.81 06/21/2023   Lab Results  Component Value Date   GFR 74.20 06/21/2023    ASSESSMENT / PLAN  1. Uncontrolled type 1 diabetes mellitus with hypoglycemia without coma (HCC)    Diabetes Mellitus type 1, complicated by diabetic neuropathy/gastroparesis. - Diabetic status / severity: Uncontrolled.  Frequent hypoglycemia.  Lab Results  Component Value Date   HGBA1C 6.2 (A) 12/24/2023    - Hemoglobin A1c goal : <7%  Patient has labile blood sugar with hypo and hyperglycemia.  Hemoglobin A1c 6.2% concern about increased hypoglycemia.  Patient currently not using CGM.  She is not able to refill.   She had arm pain and wound at the sensor site, is healed now.  She is ? not a good candidate for insulin  pump as she has difficulty with Dexcom sensor and likely will have ? difficulty learning insulin  pump operation.  Again discussed about insulin  pump therapy, patient wants to stay on the multidose insulin  regimen.  Discussed that hypoglycemia can be detrimental to the health including life-threatening.  Patient continues to take Lantus  9 units at bedtime despite decreasing the dosing last visit.  - Medications: See below.  I) decrease Lantus  to 6 units 2 times a day in the morning and at bedtime.  She is currently taking Lantus  6 units in the morning and 9 units at bedtime. II) Humalog  1-3 units with meals 3 times a day.  If you exercise do not take take humalog  for the meal as well. No more than 3 units at a time.  Unless she eats significantly large meal can use up to 5 or 6 units.  Do not take humalog  around bedtime for glucose around 200 or less.   She is on Farxiga  10 mg daily by cardiology.  - Home glucose testing: continue freestyle libre CGM check blood sugar as needed.  Sent prescription for freestyle libre 3+ with DME. - Discussed/ Gave Hypoglycemia treatment plan.  She uses glucose tablet and juice to correct hypoglycemia.  She has Glucagon  Emergency Kit at home.  # Consult : not required at this time.   # Annual urine for microalbuminuria/ creatinine ratio, no microalbuminuria currently. Last  Lab Results  Component Value Date   MICRALBCREAT 1.6 06/21/2023    # Foot check nightly / neuropathy, continue gabapentin  as needed.  # Annual dilated diabetic eye exams.   - Diet: Eat reasonable portion sizes to promote a healthy weight.  Advised to avoid frequently snacking. - Life style / activity / exercise: Discussed.  2. Blood pressure  -  BP Readings from Last 1 Encounters:  12/24/23 112/60    - Control is in target.  - No change in current plans.  3. Lipid  status / Hyperlipidemia - Last  Lab Results  Component Value Date   LDLCALC 96 06/22/2023   - Continue lovastatin  40 mg daily.  Managed by PCP.  Diagnoses and all orders for this visit:  Uncontrolled type 1 diabetes mellitus with hypoglycemia without coma (HCC) -     POCT glycosylated hemoglobin (Hb A1C) -     Continuous Glucose Sensor (FREESTYLE LIBRE 3 PLUS SENSOR) MISC; 1 each by Does not apply route continuous. Change every 15 days.    DISPOSITION Follow up in clinic in 3 months suggested.   All questions answered and patient verbalized understanding of the plan.  [place] Xavier Munger, MD [facility] Endocrinology [facility] [address] [place], [address] [place], [place] [address] Phone # [phone]  At least part of this note was generated using voice recognition software. Inadvertent word errors may have occurred, which were not recognized during the proofreading process.

## 2023-12-25 ENCOUNTER — Other Ambulatory Visit: Payer: Self-pay | Admitting: Internal Medicine

## 2023-12-27 ENCOUNTER — Other Ambulatory Visit: Payer: Self-pay

## 2023-12-27 DIAGNOSIS — E1065 Type 1 diabetes mellitus with hyperglycemia: Secondary | ICD-10-CM

## 2023-12-27 MED ORDER — ACCU-CHEK AVIVA PLUS VI STRP: ORAL_STRIP | 3 refills | Status: DC

## 2023-12-27 NOTE — Telephone Encounter (Signed)
 Test strip  refill request complete, patient made aware via phone call

## 2023-12-31 ENCOUNTER — Ambulatory Visit (INDEPENDENT_AMBULATORY_CARE_PROVIDER_SITE_OTHER): Admitting: Internal Medicine

## 2023-12-31 ENCOUNTER — Encounter: Payer: Self-pay | Admitting: Internal Medicine

## 2023-12-31 VITALS — BP 138/62 | HR 76 | Temp 98.5°F | Ht 63.5 in | Wt 110.0 lb

## 2023-12-31 DIAGNOSIS — E108 Type 1 diabetes mellitus with unspecified complications: Secondary | ICD-10-CM

## 2023-12-31 DIAGNOSIS — Z0001 Encounter for general adult medical examination with abnormal findings: Secondary | ICD-10-CM | POA: Diagnosis not present

## 2023-12-31 DIAGNOSIS — J410 Simple chronic bronchitis: Secondary | ICD-10-CM | POA: Diagnosis not present

## 2023-12-31 DIAGNOSIS — F1911 Other psychoactive substance abuse, in remission: Secondary | ICD-10-CM

## 2023-12-31 DIAGNOSIS — E1069 Type 1 diabetes mellitus with other specified complication: Secondary | ICD-10-CM

## 2023-12-31 DIAGNOSIS — I700 Atherosclerosis of aorta: Secondary | ICD-10-CM

## 2023-12-31 DIAGNOSIS — E782 Mixed hyperlipidemia: Secondary | ICD-10-CM

## 2023-12-31 DIAGNOSIS — I515 Myocardial degeneration: Secondary | ICD-10-CM

## 2023-12-31 LAB — MICROALBUMIN / CREATININE URINE RATIO
Creatinine,U: 42 mg/dL
Microalb Creat Ratio: UNDETERMINED mg/g (ref 0.0–30.0)
Microalb, Ur: <0.7 mg/dL

## 2023-12-31 MED ORDER — GABAPENTIN 300 MG PO CAPS: 300.0000 mg | ORAL_CAPSULE | Freq: Three times a day (TID) | ORAL | 5 refills | Status: DC

## 2023-12-31 MED ORDER — CETIRIZINE HCL 10 MG PO TABS: 10.0000 mg | ORAL_TABLET | Freq: Every day | ORAL | 3 refills | Status: AC

## 2023-12-31 NOTE — Assessment & Plan Note (Signed)
 Checking UACR, other labs up to date. Reviewed recent insulin  dosing changes from endo with her and she has made that change. She is unsure if this change has helped yet.

## 2023-12-31 NOTE — Assessment & Plan Note (Signed)
 Stable and is still smoking unable to make attempt to quit today.

## 2023-12-31 NOTE — Assessment & Plan Note (Signed)
 Prior and not current

## 2023-12-31 NOTE — Assessment & Plan Note (Signed)
Taking crestor and will continue.

## 2023-12-31 NOTE — Progress Notes (Signed)
   Subjective:   Patient ID: Leslie Lane, female    DOB: Dec 21, 1955, 68 y.o.   MRN: [id number]  HPI The patient is here for physical.  PMH, [facility], social history reviewed and updated  Review of Systems  Constitutional: Negative.   HENT: Negative.    Eyes: Negative.   Respiratory:  Negative for cough, chest tightness and shortness of breath.   Cardiovascular:  Negative for chest pain, palpitations and leg swelling.  Gastrointestinal:  Negative for abdominal distention, abdominal pain, constipation, diarrhea, nausea and vomiting.  Musculoskeletal: Negative.   Skin: Negative.   Neurological: Negative.   Psychiatric/Behavioral: Negative.      Objective:  Physical Exam Constitutional:      Appearance: She is well-developed.     Comments: thin  HENT:     Head: Normocephalic and atraumatic.   Cardiovascular:     Rate and Rhythm: Normal rate and regular rhythm.  Pulmonary:     Effort: Pulmonary effort is normal. No respiratory distress.     Breath sounds: Normal breath sounds. No wheezing or rales.  Abdominal:     General: Bowel sounds are normal. There is no distension.     Palpations: Abdomen is soft.     Tenderness: There is no abdominal tenderness. There is no rebound.   Musculoskeletal:     Cervical back: Normal range of motion.   Skin:    General: Skin is warm and dry.   Neurological:     Mental Status: She is alert and oriented to person, place, and time.     Sensory: Sensory deficit present.     Coordination: Coordination normal.     Vitals:   12/31/23 1257  BP: 138/62  Pulse: 76  Temp: 98.5 F (36.9 C)  TempSrc: Oral  SpO2: 97%  Weight: 110 lb (49.9 kg)  Height: 5' 3.5 (1.613 m)    Assessment & Plan:

## 2023-12-31 NOTE — Assessment & Plan Note (Signed)
 Taking crestor  and recent lipid at goal. Continue and no labs today.

## 2023-12-31 NOTE — Assessment & Plan Note (Signed)
 Flu shot yearly. Pneumonia complete. Shingrix due at pharmacy. Tetanus due at pharmacy. Colonoscopy up to date. Mammogram scheduled in 2 weeks, pap smear aged out and dexa complete. Counseled about sun safety and mole surveillance. Counseled about the dangers of distracted driving. Given 10 year screening recommendations.

## 2024-01-01 DIAGNOSIS — E1065 Type 1 diabetes mellitus with hyperglycemia: Secondary | ICD-10-CM | POA: Diagnosis not present

## 2024-01-02 MED ORDER — EZETIMIBE 10 MG PO TABS: 10.0000 mg | ORAL_TABLET | Freq: Every day | ORAL | 3 refills | Status: AC

## 2024-01-08 ENCOUNTER — Encounter: Payer: Self-pay | Admitting: Physician Assistant

## 2024-01-08 ENCOUNTER — Ambulatory Visit: Admitting: Physician Assistant

## 2024-01-08 VITALS — BP 148/62 | HR 69 | Resp 20 | Ht 63.0 in | Wt 113.0 lb

## 2024-01-08 DIAGNOSIS — F0670 Mild neurocognitive disorder due to known physiological condition without behavioral disturbance: Secondary | ICD-10-CM | POA: Diagnosis not present

## 2024-01-08 MED ORDER — DONEPEZIL HCL 10 MG PO TABS: 10.0000 mg | ORAL_TABLET | Freq: Every morning | ORAL | 3 refills | Status: DC

## 2024-01-08 NOTE — Progress Notes (Signed)
 Assessment/Plan:    Mild cognitive impairment of multiple etiologies  Leslie Lane is a very pleasant 68 y.o. RH female with a history of type 1 diabetes mellitus, hyperlipidemia, fibromyalgia, history of migraines, major depressive disorder, GAD, bipolar disorder, cutaneous lupus and with a history of mild cognitive impairment per neuropsych evaluation 03/29/2021 (which was discontinued prematurely due to anxiety) presenting today in follow-up for evaluation of memory loss. Patient is on donepezil  10 mg daily, tolerating well.  Memory is stable, with MMSE today of 30/30.  She is able to participate in her ADLs, drives without difficulty.  She continues to smoke, she is trying to quit.  Mood is controlled .     Recommendations:   Follow up in 6  months. Continue donepezil  10 mg daily Tobacco cessation counseled Recommend good control of cardiovascular risk factors Continue to control social anxiety disorder, severe MDD as per psychiatry. Recommend increasing social and physical activities  Subjective:   This patient is here alone.  Previous records as well as any outside records available were reviewed prior to todays visit.   Patient was last seen on 08/30/2023 with an MMSE 30/30.    Any changes in memory since last visit? About the same.  She continues to have subjective memory loss, with trouble retrieving words as before.  She is able to remember certain conversations and names.  she is doing brain stimulating exercises.  LTM is  less affected. repeats oneself?  Endorsed Disoriented when walking into a room?  Patient denies    Misplacing objects?  Patient denies   Wandering behavior?   Denies. Any personality changes since last visit? Denies.  History of MDD social anxiety with amotivation, feeling tired.  However, she is happier recently since her daughter is expecting a baby.  She does not like being around other people.  She sees her psychiatrist on a regular  basis. Hallucinations or paranoia?  Denies.   Seizures?   Denies.    Any sleep changes? Sleeps well with trazodone . Denies vivid dreams, REM behavior or sleepwalking   Sleep apnea?   denies    Any hygiene concerns?   Denies.   Independent of bathing and dressing?  Endorsed  Does the patient needs help with medications? Patient is in charge   Who is in charge of the finances?  Patient is in charge     Any changes in appetite?  Appetite is very good.  She eats especially when she is bored.    Patient have trouble swallowing?  Denies.   Does the patient cook?  Any kitchen accidents such as leaving the stove on?   Denies.   Any headaches?    Denies.   Vision changes? Denies. Chronic pain?  Endorsed, she has a history of chronic pain due to fibromyalgia, chronic right shoulder and neck pain. Ambulates with difficulty?    Denies.  Recent falls or head injuries?    Denies.      Unilateral weakness, numbness or tingling?  Denies.   Any tremors?  Denies.   Any anosmia?    Denies.   Any incontinence of urine?  Urge incontinence Any bowel dysfunction? Diarrhea is resolved. Patient lives with her mother who has dementia.  Does the patient drive?  Yes, during the day only, short distances, familiar places Tobacco?  She smokes 1 ppd trying to quit   History on Initial Assessment 12/18/2018: This is a 68 year old woman with a history of type I DM, hyperlipidemia, fibromyalgia,  migraines, major depressive disorder, GAD, rule out bipolar disorder, presenting for evaluation of memory loss. She is alone in the office with no family to corroborate history. She states memory changes started after she was in the hospital in a diabetic coma at age 40. She states she has not noticed any changes since then, memory has been about the same since then. She initially could not drive a car or remember a movie she had watched. She acquired a guardian to manage her finances. She lives with her mother. She has been told she  repeats herself and says the same thing every morning. She used to leave the stove on, but has been more vigilant. She has been driving and has gotten lost driving in unfamiliar roads. She manages her medications independently. She states she does not like math and was not good in school. She has reported her memory concerns to her psychiatrist and reported that she cannot focus on anything. She is easily irritable. No paranoia or hallucinations. Sleep is good with Trazodone . No family history of dementia. No history of significant head injuries. She used to drink heavily when younger, then cut down to a beer every now and then after her diabetic coma.    She has headaches that improve when she is not smoking. She has a history of migraines and has mild bitemporal headaches, taking daily magnesium . She has chronic neck and back pain and neuropathy in her feet. No dizziness, vision changes, focal weakness, bowel/bladder dysfunction, anosmia, or tremors.     Neurocognitive testing 03/29/21 Dr. Richie Briefly, Leslie Lane exhibited very limited testing tolerance and abruptly discontinued the evaluation prematurely. Unfortunately, given the abbreviated nature of testing, several domains were unable to be thoroughly assessed and the current conceptualization is limited by this. Leslie Lane pattern of performance is suggestive of prominent difficulties with basic attention, expressive language, visuospatial abilities, and encoding (i.e., learning) aspects of memory. Overall, remote substance abuse history, as well as chronic and acute psychiatric distress, is very likely exacerbating deficits related to various vascular conditions. Ongoing sleep dysfunction, chronic pain, and frequent headaches would also contribute to dysfunction.      Past Medical History:  Diagnosis Date   Abdominal pain 02/28/2016   Abnormal CT scan, bladder    Noted 07/28/13 CT - s/p uro eval 08/2013 [place] -    Acquired trigger finger of  left ring finger 02/18/2019   Injected February 18, 2019 and March 2022   Allergic rhinitis    Anemia    Arthritis    Bipolar disorder 07/05/2010   Cataract    Chronic lower back pain 07/05/2010   Chronic neck pain    Diabetic gastroparesis associated with type 1 diabetes mellitus 10/24/2016   Fibromyalgia    Generalized anxiety disorder 04/27/2014   GERD (gastroesophageal reflux disease) 07/05/2010   Hepatitis B virus infection 07/05/2010   History of substance abuse    opiates; cocaine   Hypoglycemia unawareness associated with type 1 diabetes mellitus 11/23/2020   Insomnia 04/13/2015   Major depressive disorder 07/28/2014   Migraine headache 07/05/2010   Mild neurocognitive disorder due to multiple etiologies 03/15/2021   Mixed diabetic hyperlipidemia associated with type 1 diabetes mellitus 07/05/2010   Neuropathy    Protein calorie malnutrition 06/01/2017   Right shoulder pain 02/18/2018   Seizures    pt states, if my blood sugar drops to the 20's, I convulse.  It hasn't happened in a long time.   Smokers' cough 07/05/2010   Tubular  adenoma of colon 2018   Type 1 diabetes mellitus with complication 02/06/2013   WBC decreased 03/27/2017     Past Surgical History:  Procedure Laterality Date   CESAREAN SECTION     COLONOSCOPY     TUBAL LIGATION     UPPER GASTROINTESTINAL ENDOSCOPY       PREVIOUS MEDICATIONS:   CURRENT MEDICATIONS:  Outpatient Encounter Medications as of 01/08/2024  Medication Sig   Accu-Chek Softclix Lancets lancets Use as instructed to check blood sugar 4 times per day dx code E10.65   Alcohol Swabs (B-D SINGLE USE SWABS REGULAR) PADS USE FOUR TIMES DAILY   aspirin EC 81 MG tablet Take 81 mg by mouth every morning.   B-D ULTRAFINE III SHORT PEN 31G X 8 MM MISC USE TO INJECT WITH PRE-FILLED PEN SYRINGE SUB-Q DAILY   Blood Glucose Calibration (ACCU-CHEK AVIVA) SOLN USE AS DIRECTED   Blood Glucose Monitoring Suppl (ACCU-CHEK AVIVA PLUS) w/Device KIT  USE AS DIRECTED   cetirizine  (ZYRTEC ) 10 MG tablet Take 1 tablet (10 mg total) by mouth daily.   Clobetasol Propionate 0.05 % shampoo Apply topically.   Continuous Glucose Receiver (FREESTYLE LIBRE 3 READER) DEVI 1 Device by Does not apply route continuous.   Continuous Glucose Sensor (FREESTYLE LIBRE 3 PLUS SENSOR) MISC 1 each by Does not apply route continuous. Change every 15 days.   dapagliflozin  propanediol (FARXIGA ) 10 MG TABS tablet Take 1 tablet (10 mg total) by mouth daily.   ezetimibe  (ZETIA ) 10 MG tablet Take 1 tablet (10 mg total) by mouth daily.   fexofenadine (ALLEGRA) 180 MG tablet Take 180 mg by mouth daily.   gabapentin  (NEURONTIN ) 300 MG capsule Take 1 capsule (300 mg total) by mouth 3 (three) times daily.   Glucagon  (GVOKE HYPOPEN  2-PACK) 1 MG/0.2ML SOAJ INJECT 1 MG UNDER THE SKIN AS NEEDED FOR LOW BLOOD SUGAR.   glucose blood (ACCU-CHEK AVIVA PLUS) test strip CHECK BLOOD SUGAR 4 TIMES DAILY   Glucose Blood (BLOOD GLUCOSE TEST STRIPS) STRP 1 each by In Vitro route in the morning, at noon, and at bedtime. May substitute to any manufacturer covered by patient's insurance.   hyoscyamine  (LEVBID ) 0.375 MG 12 hr tablet Take 1 tablet (0.375 mg total) by mouth 2 (two) times daily.   insulin  glargine (LANTUS ) 100 UNIT/ML Solostar Pen Take 5 units in the morning and 5 units at bedtime.   insulin  lispro (HUMALOG  KWIKPEN) 100 UNIT/ML KwikPen INJECT SUBCUTANEOUSLY 2-5  UNITS 3 TIMES DAILY AS INSTRUCTED MAXIMUM 20 UNITS A DAY.   Insulin  Pen Needle 32G X 4 MM MISC Use inject insulin  tid   Insulin  Syringe-Needle U-100 (B-D INSULIN  SYRINGE) 31G X 5/16 0.3 ML MISC Use to administer insulin    lamoTRIgine  (LAMICTAL ) 100 MG tablet Take 1 tablet (100 mg total) by mouth 2 (two) times daily.   lipase/protease/amylase (CREON ) 36000 UNITS CPEP capsule Take 2 capsules by mouth three times a day with meals and 1 capsule by mouth twice daily with snacks   LORazepam  (ATIVAN ) 0.5 MG tablet 1-2 tabs 30 - 60  min prior to MRI. Do not drive with this medicine.   Magnesium  500 MG TABS Take 1 tablet by mouth daily.   metoprolol  succinate (TOPROL  XL) 25 MG 24 hr tablet Take 1 tablet (25 mg total) by mouth daily.   Multiple Vitamin (MULTIVITAMIN) tablet Take 1 tablet by mouth daily. Reported on 11/22/2015   Omega-3 Fatty Acids (FISH OIL) 1000 MG CAPS Take 1 capsule by mouth daily.  omeprazole  (PRILOSEC) 40 MG capsule TAKE 1 CAPSULE(40 MG) BY MOUTH IN THE MORNING AND AT BEDTIME   traZODone  (DESYREL ) 150 MG tablet Take 1 tablet (150 mg total) by mouth at bedtime.   triamcinolone  cream (KENALOG) 0.1 %    vitamin E 1000 UNIT capsule Take 1,000 Units by mouth daily.   [DISCONTINUED] donepezil  (ARICEPT ) 10 MG tablet Take 1 tablet (10 mg total) by mouth at bedtime.   cyclobenzaprine  (FLEXERIL ) 5 MG tablet Take 1 tablet (5 mg total) by mouth 3 (three) times daily as needed for muscle spasms. (Patient not taking: Reported on 01/08/2024)   donepezil  (ARICEPT ) 10 MG tablet Take 1 tablet (10 mg total) by mouth every morning.   DULoxetine  (CYMBALTA ) 20 MG capsule Take 1 capsule (20 mg total) by mouth daily. (Patient not taking: Reported on 12/31/2023)   rosuvastatin  (CRESTOR ) 40 MG tablet Take 1 tablet (40 mg total) by mouth daily.   umeclidinium-vilanterol (ANORO ELLIPTA ) 62.5-25 MCG/ACT AEPB INHALE 1 PUFF INTO THE LUNGS DAILY (Patient not taking: Reported on 01/08/2024)   No facility-administered encounter medications on file as of 01/08/2024.     Objective:     PHYSICAL EXAMINATION:    VITALS:   Vitals:   01/08/24 1458  BP: (!) 148/62  Pulse: 69  Resp: 20  SpO2: 96%  Weight: 113 lb (51.3 kg)  Height: 5' 3 (1.6 m)    GEN:  The patient appears stated age and is in NAD. HEENT:  Normocephalic, atraumatic.   Neurological examination:  General: NAD, well-groomed, appears stated age. Orientation: The patient is alert. Oriented to person, place and  to date.  Cranial nerves: There is good facial  symmetry.The speech is fluent and clear. No aphasia or dysarthria. Fund of knowledge is appropriate. Recent memory impaired and remote memory is normal.  Anxious appearing attention and concentration are normal.  Able to name objects and repeat phrases.  Hearing is intact to conversational tone.   Delayed recall 3/3 Sensation: Sensation is intact to light touch throughout Motor: Strength is at least antigravity x4. DTR's 2/4 in UE/LE      07/30/2019    1:00 PM 12/18/2018   11:00 AM  Montreal Cognitive Assessment   Visuospatial/ Executive (0/5)  1  Naming (0/3)  3  Attention: Read list of digits (0/2) 1 1  Attention: Read list of letters (0/1) 1 1  Attention: Serial 7 subtraction starting at 100 (0/3) 1 2  Language: Repeat phrase (0/2) 0 0  Language : Fluency (0/1) 0 0  Abstraction (0/2) 2 0  Delayed Recall (0/5) 4 0  Orientation (0/6) 6 5  Total  13  Adjusted Score (based on education)  14       01/08/2024    6:00 PM 08/30/2023    4:00 PM 02/27/2023    1:00 PM  MMSE - Mini Mental State Exam  Orientation to time 5 5 4   Orientation to Place 5 5 5   Registration 3 3 3   Attention/ Calculation 5 5 5   Recall 3 3 3   Language- name 2 objects 2 2 2   Language- repeat 1 1 1   Language- follow 3 step command 3 3 3   Language- read & follow direction 1 1 1   Write a sentence 1 1 1   Copy design 1 1 0  Total score 30 30 28        Movement examination: Tone: There is normal tone in the UE/LE Abnormal movements:  no tremor.  No myoclonus.  No asterixis.  Coordination:  There is no decremation with RAM's. Normal finger to nose  Gait and Station: The patient has no difficulty arising out of a deep-seated chair without the use of the hands. The patient's stride length is good.  Gait is cautious and narrow.   Thank you for allowing us  the opportunity to participate in the care of this nice patient. Please do not hesitate to contact us  for any questions or concerns.   Total time spent on today's  visit was 24 minutes dedicated to this patient today, preparing to see patient, examining the patient, ordering tests and/or medications and counseling the patient, documenting clinical information in the EHR or other health record, independently interpreting results and communicating results to the patient/family, discussing treatment and goals, answering patient's questions and coordinating care.  Cc:  Rollene Almarie LABOR, MD  Camie Sevin 01/08/2024 6:12 PM

## 2024-01-08 NOTE — Patient Instructions (Signed)
 It was a pleasure to see you today at our office.   Recommendations:  Meds: Continue Donepezil 10 mg: take 1 tablet in the morning Follow up in 6 months  Monitor your sugars  Follow up with psychiatry, GI, Primary doctors  Stop smoking   Get involved with activities outside of the house   RECOMMENDATIONS FOR ALL PATIENTS WITH MEMORY PROBLEMS: 1. Continue to exercise (Recommend 30 minutes of walking everyday, or 3 hours every week) 2. Increase social interactions - continue going to [place] and enjoy social gatherings with friends and family 3. Eat healthy, avoid fried foods and eat more fruits and vegetables 4. Maintain adequate blood pressure, blood sugar, and blood cholesterol level. Reducing the risk of stroke and cardiovascular disease also helps promoting better memory. 5. Avoid stressful situations. Live a simple life and avoid aggravations. Organize your time and prepare for the next day in anticipation. 6. Sleep well, avoid any interruptions of sleep and avoid any distractions in the bedroom that may interfere with adequate sleep quality 7. Avoid sugar, avoid sweets as there is a strong link between excessive sugar intake, diabetes, and cognitive impairment We discussed the Mediterranean diet, which has been shown to help patients reduce the risk of progressive memory disorders and reduces cardiovascular risk. This includes eating fish, eat fruits and green leafy vegetables, nuts like almonds and hazelnuts, walnuts, and also use olive oil. Avoid fast foods and fried foods as much as possible. Avoid sweets and sugar as sugar use has been linked to worsening of memory function.  There is always a concern of gradual progression of memory problems. If this is the case, then we may need to adjust level of care according to patient needs. Support, both to the patient and caregiver, should then be put into place.      You have been referred for a neuropsychological evaluation (i.e.,  evaluation of memory and thinking abilities). Please bring someone with you to this appointment if possible, as it is helpful for the doctor to hear from both you and another adult who knows you well. Please bring eyeglasses and hearing aids if you wear them.    The evaluation will take approximately 3 hours and has two parts:   The first part is a clinical interview with the neuropsychologist (Dr. Milbert Coulter or Dr. Roseanne Reno). During the interview, the neuropsychologist will speak with you and the individual you brought to the appointment.    The second part of the evaluation is testing with the doctor's technician Annabelle Harman or Selena Batten). During the testing, the technician will ask you to remember different types of material, solve problems, and answer some questionnaires. Your family member will not be present for this portion of the evaluation.   Please note: We must reserve several hours of the neuropsychologist's time and the psychometrician's time for your evaluation appointment. As such, there is a No-Show fee of $100. If you are unable to attend any of your appointments, please contact our office as soon as possible to reschedule.    FALL PRECAUTIONS: Be cautious when walking. Scan the area for obstacles that may increase the risk of trips and falls. When getting up in the mornings, sit up at the edge of the bed for a few minutes before getting out of bed. Consider elevating the bed at the head end to avoid drop of blood pressure when getting up. Walk always in a well-lit room (use night lights in the walls). Avoid area rugs or power cords from appliances  in the middle of the walkways. Use a walker or a cane if necessary and consider physical therapy for balance exercise. Get your eyesight checked regularly.  FINANCIAL OVERSIGHT: Supervision, especially oversight when making financial decisions or transactions is also recommended.  HOME SAFETY: Consider the safety of the kitchen when operating appliances like  stoves, microwave oven, and blender. Consider having supervision and share cooking responsibilities until no longer able to participate in those. Accidents with firearms and other hazards in the house should be identified and addressed as well.   ABILITY TO BE LEFT ALONE: If patient is unable to contact 911 operator, consider using LifeLine, or when the need is there, arrange for someone to stay with patients. Smoking is a fire hazard, consider supervision or cessation. Risk of wandering should be assessed by caregiver and if detected at any point, supervision and safe proof recommendations should be instituted.  MEDICATION SUPERVISION: Inability to self-administer medication needs to be constantly addressed. Implement a mechanism to ensure safe administration of the medications.   DRIVING: Regarding driving, in patients with progressive memory problems, driving will be impaired. We advise to have someone else do the driving if trouble finding directions or if minor accidents are reported. Independent driving assessment is available to determine safety of driving.   If you are interested in the driving assessment, you can contact the following:  [facility] [employer] in [place] [phone]  [facility] [phone]  [facility] [phone] or [phone]    Mediterranean Diet A Mediterranean diet refers to food and lifestyle choices that are based on the traditions of countries located on the [employer]. This way of eating has been shown to help prevent certain conditions and improve outcomes for people who have chronic diseases, like kidney disease and heart disease. What are tips for following this plan? Lifestyle  Cook and eat meals together with your family, when possible. Drink enough fluid to keep your urine clear or pale yellow. Be physically active every day. This includes: Aerobic exercise like running  or swimming. Leisure activities like gardening, walking, or housework. Get 7-8 hours of sleep each night. If recommended by your health care provider, drink red wine in moderation. This means 1 glass a day for nonpregnant women and 2 glasses a day for men. A glass of wine equals 5 oz (150 mL). Reading food labels  Check the serving size of packaged foods. For foods such as rice and pasta, the serving size refers to the amount of cooked product, not dry. Check the total fat in packaged foods. Avoid foods that have saturated fat or trans fats. Check the ingredients list for added sugars, such as corn syrup. Shopping  At the grocery store, buy most of your food from the areas near the walls of the store. This includes: Fresh fruits and vegetables (produce). Grains, beans, nuts, and seeds. Some of these may be available in unpackaged forms or large amounts (in bulk). Fresh seafood. Poultry and eggs. Low-fat dairy products. Buy whole ingredients instead of prepackaged foods. Buy fresh fruits and vegetables in-season from local farmers markets. Buy frozen fruits and vegetables in resealable bags. If you do not have access to quality fresh seafood, buy precooked frozen shrimp or canned fish, such as tuna, salmon, or sardines. Buy small amounts of raw or cooked vegetables, salads, or olives from the deli or salad bar at your store. Stock your pantry so you always have certain foods on hand, such as olive  oil, canned tuna, canned tomatoes, rice, pasta, and beans. Cooking  Cook foods with extra-virgin olive oil instead of using butter or other vegetable oils. Have meat as a side dish, and have vegetables or grains as your main dish. This means having meat in small portions or adding small amounts of meat to foods like pasta or stew. Use beans or vegetables instead of meat in common dishes like chili or lasagna. Experiment with different cooking methods. Try roasting or broiling vegetables instead of  steaming or sauteing them. Add frozen vegetables to soups, stews, pasta, or rice. Add nuts or seeds for added healthy fat at each meal. You can add these to yogurt, salads, or vegetable dishes. Marinate fish or vegetables using olive oil, lemon juice, garlic, and fresh herbs. Meal planning  Plan to eat 1 vegetarian meal one day each week. Try to work up to 2 vegetarian meals, if possible. Eat seafood 2 or more times a week. Have healthy snacks readily available, such as: Vegetable sticks with hummus. Greek yogurt. Fruit and nut trail mix. Eat balanced meals throughout the week. This includes: Fruit: 2-3 servings a day Vegetables: 4-5 servings a day Low-fat dairy: 2 servings a day Fish, poultry, or lean meat: 1 serving a day Beans and legumes: 2 or more servings a week Nuts and seeds: 1-2 servings a day Whole grains: 6-8 servings a day Extra-virgin olive oil: 3-4 servings a day Limit red meat and sweets to only a few servings a month What are my food choices? Mediterranean diet Recommended Grains: Whole-grain pasta. Brown rice. Bulgar wheat. Polenta. Couscous. Whole-wheat bread. Orpah Cobb. Vegetables: Artichokes. Beets. Broccoli. Cabbage. Carrots. Eggplant. Green beans. Chard. Kale. Spinach. Onions. Leeks. Peas. Squash. Tomatoes. Peppers. Radishes. Fruits: Apples. Apricots. Avocado. Berries. Bananas. Cherries. Dates. Figs. Grapes. Lemons. Melon. Oranges. Peaches. Plums. Pomegranate. Meats and other protein foods: Beans. Almonds. Sunflower seeds. Pine nuts. Peanuts. Cod. Salmon. Scallops. Shrimp. Tuna. Tilapia. Clams. Oysters. Eggs. Dairy: Low-fat milk. Cheese. Greek yogurt. Beverages: Water. Red wine. Herbal tea. Fats and oils: Extra virgin olive oil. Avocado oil. Grape seed oil. Sweets and desserts: [place] yogurt with honey. Baked apples. Poached pears. Trail mix. Seasoning and other foods: Basil. Cilantro. Coriander. Cumin. Mint. Parsley. Sage. Rosemary. Tarragon. Garlic.  Oregano. Thyme. Pepper. Balsalmic vinegar. Tahini. Hummus. Tomato sauce. Olives. Mushrooms. Limit these Grains: Prepackaged pasta or rice dishes. Prepackaged cereal with added sugar. Vegetables: Deep fried potatoes (french fries). Fruits: Fruit canned in syrup. Meats and other protein foods: Beef. Pork. Lamb. Poultry with skin. Hot dogs. Tomasa Blase. Dairy: Ice cream. Sour cream. Whole milk. Beverages: Juice. Sugar-sweetened soft drinks. Beer. Liquor and spirits. Fats and oils: Butter. Canola oil. Vegetable oil. Beef fat (tallow). Lard. Sweets and desserts: Cookies. Cakes. Pies. Candy. Seasoning and other foods: Mayonnaise. Premade sauces and marinades. The items listed may not be a complete list. Talk with your dietitian about what dietary choices are right for you. Summary The Mediterranean diet includes both food and lifestyle choices. Eat a variety of fresh fruits and vegetables, beans, nuts, seeds, and whole grains. Limit the amount of red meat and sweets that you eat. Talk with your health care provider about whether it is safe for you to drink red wine in moderation. This means 1 glass a day for nonpregnant women and 2 glasses a day for men. A glass of wine equals 5 oz (150 mL). This information is not intended to replace advice given to you by your health care provider. Make sure you discuss any questions  you have with your health care provider. Document Released: 02/10/2016 Document Revised: 03/14/2016 Document Reviewed: 02/10/2016 Elsevier Interactive Patient Education  2017 [employer].

## 2024-01-11 ENCOUNTER — Other Ambulatory Visit: Payer: Self-pay

## 2024-01-11 ENCOUNTER — Telehealth: Payer: Self-pay

## 2024-01-11 DIAGNOSIS — E10649 Type 1 diabetes mellitus with hypoglycemia without coma: Secondary | ICD-10-CM

## 2024-01-11 MED ORDER — INSULIN PEN NEEDLE 32G X 4 MM MISC: 3 refills | Status: AC

## 2024-01-11 NOTE — Telephone Encounter (Signed)
 Patient called and left VM requesting refill for pen needles. Patient refill request complete, patient called to make aware no answer. Patient does not have Mychart.

## 2024-01-13 ENCOUNTER — Other Ambulatory Visit: Payer: Self-pay | Admitting: Physician Assistant

## 2024-01-14 ENCOUNTER — Other Ambulatory Visit (HOSPITAL_COMMUNITY): Payer: Self-pay | Admitting: Psychiatry

## 2024-01-14 ENCOUNTER — Other Ambulatory Visit: Payer: Self-pay | Admitting: Physician Assistant

## 2024-01-14 DIAGNOSIS — F332 Major depressive disorder, recurrent severe without psychotic features: Secondary | ICD-10-CM

## 2024-01-14 DIAGNOSIS — F4010 Social phobia, unspecified: Secondary | ICD-10-CM

## 2024-01-16 ENCOUNTER — Ambulatory Visit
Admission: RE | Admit: 2024-01-16 | Discharge: 2024-01-16 | Disposition: A | Discharge status: Home / Self Care | Source: Ambulatory Visit | Attending: Internal Medicine | Admitting: Internal Medicine

## 2024-01-16 DIAGNOSIS — Z1231 Encounter for screening mammogram for malignant neoplasm of breast: Secondary | ICD-10-CM

## 2024-01-22 ENCOUNTER — Other Ambulatory Visit: Payer: Self-pay | Admitting: Internal Medicine

## 2024-01-22 DIAGNOSIS — R928 Other abnormal and inconclusive findings on diagnostic imaging of breast: Secondary | ICD-10-CM

## 2024-01-24 ENCOUNTER — Encounter (HOSPITAL_COMMUNITY): Payer: Self-pay | Admitting: Psychiatry

## 2024-01-24 ENCOUNTER — Ambulatory Visit (HOSPITAL_COMMUNITY): Admitting: Psychiatry

## 2024-01-24 ENCOUNTER — Other Ambulatory Visit: Payer: Self-pay

## 2024-01-24 VITALS — BP 153/62 | HR 76 | Ht 63.5 in | Wt 112.0 lb

## 2024-01-24 DIAGNOSIS — F332 Major depressive disorder, recurrent severe without psychotic features: Secondary | ICD-10-CM | POA: Diagnosis not present

## 2024-01-24 DIAGNOSIS — R451 Restlessness and agitation: Secondary | ICD-10-CM

## 2024-01-24 DIAGNOSIS — G4701 Insomnia due to medical condition: Secondary | ICD-10-CM | POA: Diagnosis not present

## 2024-01-24 DIAGNOSIS — F4010 Social phobia, unspecified: Secondary | ICD-10-CM

## 2024-01-24 MED ORDER — TRAZODONE HCL 150 MG PO TABS: 150.0000 mg | ORAL_TABLET | Freq: Every day | ORAL | 1 refills | Status: DC

## 2024-01-24 MED ORDER — HYDROXYZINE HCL 10 MG PO TABS: 10.0000 mg | ORAL_TABLET | Freq: Two times a day (BID) | ORAL | 0 refills | Status: DC | PRN

## 2024-01-24 MED ORDER — LAMOTRIGINE 100 MG PO TABS: 100.0000 mg | ORAL_TABLET | Freq: Two times a day (BID) | ORAL | 1 refills | Status: DC
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
## 2024-01-25 ENCOUNTER — Other Ambulatory Visit: Payer: Self-pay | Admitting: Internal Medicine

## 2024-01-25 ENCOUNTER — Ambulatory Visit
Admission: RE | Admit: 2024-01-25 | Discharge: 2024-01-25 | Disposition: A | Discharge status: Home / Self Care | Source: Ambulatory Visit | Attending: Internal Medicine | Admitting: Internal Medicine

## 2024-01-25 DIAGNOSIS — R921 Mammographic calcification found on diagnostic imaging of breast: Secondary | ICD-10-CM

## 2024-01-25 DIAGNOSIS — R928 Other abnormal and inconclusive findings on diagnostic imaging of breast: Secondary | ICD-10-CM

## 2024-01-30 ENCOUNTER — Other Ambulatory Visit: Payer: Self-pay | Admitting: Internal Medicine

## 2024-01-31 ENCOUNTER — Ambulatory Visit (HOSPITAL_COMMUNITY)
Admission: RE | Admit: 2024-01-31 | Discharge: 2024-01-31 | Disposition: A | Discharge status: Home / Self Care | Source: Ambulatory Visit | Attending: Physician Assistant | Admitting: Physician Assistant

## 2024-01-31 ENCOUNTER — Ambulatory Visit (HOSPITAL_COMMUNITY)
Admission: RE | Admit: 2024-01-31 | Discharge: 2024-01-31 | Discharge status: Home / Self Care | Source: Ambulatory Visit | Attending: Physician Assistant | Admitting: Physician Assistant

## 2024-01-31 DIAGNOSIS — I739 Peripheral vascular disease, unspecified: Secondary | ICD-10-CM | POA: Diagnosis not present

## 2024-01-31 DIAGNOSIS — I342 Nonrheumatic mitral (valve) stenosis: Secondary | ICD-10-CM | POA: Diagnosis not present

## 2024-01-31 LAB — ECHOCARDIOGRAM COMPLETE
AR max vel: 1.01 cm2
AV Area VTI: 1.07 cm2
AV Area mean vel: 1.01 cm2
AV Mean grad: 3.5 mmHg
AV Peak grad: 6.8 mmHg
Ao pk vel: 1.31 m/s
Area-P 1/2: 2.91 cm2
MV M vel: 1.9 m/s
MV Peak grad: 14.4 mmHg
MV VTI: 0.36 cm2
Radius: 0.64 cm
S' Lateral: 3.18 cm

## 2024-02-04 LAB — VAS US PAD ABI
Left ABI: 1.21
Right ABI: 1.19

## 2024-02-04 NOTE — Progress Notes (Signed)
 Pt has been made aware of normal result and verbalized understanding.  jw

## 2024-02-19 ENCOUNTER — Other Ambulatory Visit (HOSPITAL_COMMUNITY): Payer: Self-pay | Admitting: Psychiatry

## 2024-02-19 ENCOUNTER — Telehealth: Payer: Self-pay

## 2024-02-19 DIAGNOSIS — F332 Major depressive disorder, recurrent severe without psychotic features: Secondary | ICD-10-CM

## 2024-02-19 DIAGNOSIS — F4010 Social phobia, unspecified: Secondary | ICD-10-CM

## 2024-02-19 DIAGNOSIS — R451 Restlessness and agitation: Secondary | ICD-10-CM

## 2024-02-19 NOTE — Telephone Encounter (Signed)
 Forms from [facility] imaging was received on 01/22/2024 and fax back successfully on 01/28/2024

## 2024-02-27 ENCOUNTER — Ambulatory Visit: Payer: Medicare Other | Admitting: Physician Assistant

## 2024-03-10 ENCOUNTER — Other Ambulatory Visit: Payer: Self-pay

## 2024-03-10 ENCOUNTER — Other Ambulatory Visit: Payer: Self-pay | Admitting: Physician Assistant

## 2024-03-10 ENCOUNTER — Ambulatory Visit: Admitting: Family Medicine

## 2024-03-10 VITALS — BP 146/62 | HR 61 | Ht 63.5 in | Wt 113.0 lb

## 2024-03-10 DIAGNOSIS — M65331 Trigger finger, right middle finger: Secondary | ICD-10-CM | POA: Diagnosis not present

## 2024-03-10 NOTE — Patient Instructions (Signed)
 Thank you for coming in today.   We've placed a referral for a consult for surgery with Dr. Anshul Agarwala, you should hear from their office over the next 1-2 weeks about scheduling.   Double Band-Aid Splint  Please use Voltaren  gel (Generic Diclofenac  Gel) up to 4x daily for pain as needed.  This is available over-the-counter as both the name brand Voltaren  gel and the generic diclofenac  gel.   See you back as needed.

## 2024-03-10 NOTE — Progress Notes (Signed)
   LILLETTE Ileana Collet, PhD, LAT, ATC acting as a scribe for Artist Lloyd, MD.  Leslie Lane is a 68 y.o. female who presents to [facility] today for R hand pain ongoing for more than 6 months. She notes the finger will lock up. Pt locates pain to the the whole R 3rd finger and into the 3rd MC along the palmar aspect..  She is RHD.   Swelling: no Aggravates: at night, fine motor activities Treatments tried: Tylenol   Pertinent review of systems: No fevers or chills  Relevant historical information: Diabetes   Exam:  BP (!) 146/62   Pulse 61   Ht 5' 3.5 (1.613 m)   Wt 113 lb (51.3 kg)   SpO2 98%   BMI 19.70 kg/m  General: Well Developed, well nourished, and in no acute distress.   MSK: Right hand some swelling at third palmar MCP.  Triggering present with flexion of PIP joint.      Assessment and Plan: 68 y.o. female with right hand trigger finger third digit.  Patient had a corticosteroid trigger finger injection about 9 months ago in December 2024 that worked pretty well until recently.  She notes however this caused significant hyperglycemia and she would like to avoid another injection.  She is willing to consider surgery.  Plan to refer to hand surgery.  In the interim recommend double Band-Aid splint and diclofenac  gel.   PDMP not reviewed this encounter. Orders Placed This Encounter  Procedures   Ambulatory referral to Orthopedic Surgery    Referral Priority:   Routine    Referral Type:   Surgical    Referral Reason:   Specialty Services Required    Referred to Provider:   Arlinda Buster, MD    Requested Specialty:   Orthopedic Surgery    Number of Visits Requested:   1   No orders of the defined types were placed in this encounter.    Discussed warning signs or symptoms. Please see discharge instructions. Patient expresses understanding.   The above documentation has been reviewed and is accurate and complete Artist Lloyd,  M.D.

## 2024-03-19 ENCOUNTER — Other Ambulatory Visit: Payer: Self-pay

## 2024-03-19 DIAGNOSIS — E10649 Type 1 diabetes mellitus with hypoglycemia without coma: Secondary | ICD-10-CM

## 2024-03-19 MED ORDER — LANCETS MISC: 3 refills | Status: AC

## 2024-03-22 ENCOUNTER — Other Ambulatory Visit (HOSPITAL_COMMUNITY): Payer: Self-pay | Admitting: Psychiatry

## 2024-03-22 DIAGNOSIS — F332 Major depressive disorder, recurrent severe without psychotic features: Secondary | ICD-10-CM

## 2024-03-22 DIAGNOSIS — F4010 Social phobia, unspecified: Secondary | ICD-10-CM

## 2024-03-27 ENCOUNTER — Encounter (HOSPITAL_COMMUNITY): Payer: Self-pay | Admitting: Psychiatry

## 2024-03-27 ENCOUNTER — Other Ambulatory Visit: Payer: Self-pay

## 2024-03-27 ENCOUNTER — Ambulatory Visit (HOSPITAL_COMMUNITY): Admitting: Psychiatry

## 2024-03-27 VITALS — BP 156/67 | HR 72 | Ht 63.5 in | Wt 113.0 lb

## 2024-03-27 DIAGNOSIS — E1065 Type 1 diabetes mellitus with hyperglycemia: Secondary | ICD-10-CM

## 2024-03-27 DIAGNOSIS — F4010 Social phobia, unspecified: Secondary | ICD-10-CM

## 2024-03-27 DIAGNOSIS — G4701 Insomnia due to medical condition: Secondary | ICD-10-CM

## 2024-03-27 DIAGNOSIS — F332 Major depressive disorder, recurrent severe without psychotic features: Secondary | ICD-10-CM | POA: Diagnosis not present

## 2024-03-27 MED ORDER — TRAZODONE HCL 50 MG PO TABS: 50.0000 mg | ORAL_TABLET | Freq: Every day | ORAL | 1 refills | Status: DC

## 2024-03-27 MED ORDER — LAMOTRIGINE 100 MG PO TABS: 100.0000 mg | ORAL_TABLET | Freq: Two times a day (BID) | ORAL | 1 refills | Status: DC

## 2024-03-27 MED ORDER — NORTRIPTYLINE HCL 25 MG PO CAPS: 25.0000 mg | ORAL_CAPSULE | Freq: Every day | ORAL | 1 refills | Status: DC

## 2024-03-27 MED ORDER — ACCU-CHEK AVIVA PLUS VI STRP: ORAL_STRIP | 3 refills | Status: AC

## 2024-03-27 NOTE — Progress Notes (Addendum)
 BH MD/PA/NP OP Progress Note  Patient location; office Provider location; office  03/27/2024 8:49 AM Leslie [address]  MRN:  [id number]  Chief Complaint:  Chief Complaint  Patient presents with   Stress   Anxiety   Follow-up   Medication Refill   HPI: Patient came today to the office for her appointment.  We started her on Atarax  but after few doses she stopped taking because she felt more tired and have no energy.  She continues to have anxiety, nervousness and chronic complain of fatigue tiredness.  We had GeneSight testing and results came.  I discussed results.  Majority of the medications are in favorable section other than Cymbalta .  Patient smokes and also drink 1 beer every night to keep herself calm.  She is sleeping 4 to 5 hours.  She does not leave the house as feel very nervous anxious around people.  Patient's daughter is expecting end of the December.  This will be her third child.  Patient told daughter has 2 kids from her previous relationship.  Patient also reported her mood and energy fluctuates because of blood sugar.  This morning her blood sugar was very low and she has to drink orange juice.  She still have headaches but they are not as bad.  She reported chronic hopelessness dysphoria, sadness.  Her appetite is okay.  She remained restless and fidgety during the session.  She is taking Lamictal  and trazodone .  She has no rash or itching.  She denies any hallucination, paranoia or any active or passive suicidal thoughts.   Visit Diagnosis:    ICD-10-CM   1. Major depressive disorder, recurrent, severe without psychotic features (HCC)  F33.2 lamoTRIgine  (LAMICTAL ) 100 MG tablet    traZODone  (DESYREL ) 50 MG tablet    nortriptyline  (PAMELOR ) 25 MG capsule    2. Social anxiety disorder  F40.10 lamoTRIgine  (LAMICTAL ) 100 MG tablet    nortriptyline  (PAMELOR ) 25 MG capsule    3. Insomnia due to medical condition  G47.01 traZODone  (DESYREL ) 50 MG tablet    nortriptyline   (PAMELOR ) 25 MG capsule       Past Psychiatric History: Reviewed H/O cutting arm at young age.  H/O overdose on diabetes medication at age 66.  H/O drinking and saw Mental health provider. Took Zoloft , Wellbutrin , BuSpar , Paxil , Cymbalta  and Atrax caused tiredness. Lexapro  and Trintellix  caused headache. No history of psychosis.  H/O social anxiety.  Saw Dr. Brutus in the past.  Past Medical History:  Past Medical History:  Diagnosis Date   Abdominal pain 02/28/2016   Abnormal CT scan, bladder    Noted 07/28/13 CT - s/p uro eval 08/2013 [place] -    Acquired trigger finger of left ring finger 02/18/2019   Injected February 18, 2019 and March 2022   Allergic rhinitis    Anemia    Arthritis    Bipolar disorder 07/05/2010   Cataract    Chronic lower back pain 07/05/2010   Chronic neck pain    Diabetic gastroparesis associated with type 1 diabetes mellitus 10/24/2016   Fibromyalgia    Generalized anxiety disorder 04/27/2014   GERD (gastroesophageal reflux disease) 07/05/2010   Hepatitis B virus infection 07/05/2010   History of substance abuse    opiates; cocaine   Hypoglycemia unawareness associated with type 1 diabetes mellitus 11/23/2020   Insomnia 04/13/2015   Major depressive disorder 07/28/2014   Migraine headache 07/05/2010   Mild neurocognitive disorder due to multiple etiologies 03/15/2021   Mixed diabetic hyperlipidemia associated with  type 1 diabetes mellitus 07/05/2010   Neuropathy    Protein calorie malnutrition 06/01/2017   Right shoulder pain 02/18/2018   Seizures    pt states, if my blood sugar drops to the 20's, I convulse.  It hasn't happened in a long time.   Smokers' cough 07/05/2010   Tubular adenoma of colon 2018   Type 1 diabetes mellitus with complication 02/06/2013   WBC decreased 03/27/2017    Past Surgical History:  Procedure Laterality Date   CESAREAN SECTION     COLONOSCOPY     TUBAL LIGATION     UPPER GASTROINTESTINAL ENDOSCOPY       Family Psychiatric History: Reviewed  Family History:  Family History  Problem Relation Age of Onset   Arthritis Mother    Diverticulosis Mother    Kidney disease Mother    Hyperlipidemia Mother    Arthritis Father    Kidney disease Father    Kidney cancer Father    Bladder Cancer Sister    Colon cancer Sister 77   Celiac disease Sister    Heart attack Brother    Kidney cancer Paternal Aunt    Diabetes Niece    Hyperlipidemia Other    Anxiety disorder Neg Hx    Bipolar disorder Neg Hx    Depression Neg Hx    Breast cancer Neg Hx    Esophageal cancer Neg Hx    Stomach cancer Neg Hx    Rectal cancer Neg Hx     Social History:  Social History   Socioeconomic History   Marital status: Divorced    Spouse name: Not on file   Number of children: 1   Years of education: 12   Highest education level: High school graduate  Occupational History   Occupation: Disability  Tobacco Use   Smoking status: Every Day    Current packs/day: 1.50    Average packs/day: 1.5 packs/day for 50.0 years (75.0 ttl pk-yrs)    Types: Cigarettes   Smokeless tobacco: Never   Tobacco comments:    Reports she has cut back to a cartoon that now lasts 2 weeks.   Vaping Use   Vaping status: Former  Substance and Sexual Activity   Alcohol use: Yes    Alcohol/week: 7.0 standard drinks of alcohol    Types: 7 Cans of beer per week    Comment: 1 beer nightly   Drug use: Not Currently    Types: Cocaine    Comment: past opiate and cocaine abuse   Sexual activity: Not Currently    Birth control/protection: Surgical, Post-menopausal  Other Topics Concern   Not on file  Social History Narrative   Divorced, disabled   Lives with parents   Right handed    Retired   Caffeine prn   Social Drivers of Corporate investment banker Strain: Low Risk  (03/14/2022)   Overall Financial Resource Strain (CARDIA)    Difficulty of Paying Living Expenses: Not hard at all  Food Insecurity: No Food  Insecurity (05/23/2023)   Hunger Vital Sign    Worried About Running Out of Food in the Last Year: Never true    Ran Out of Food in the Last Year: Never true  Transportation Needs: No Transportation Needs (05/23/2023)   PRAPARE - Administrator, Civil Service (Medical): No    Lack of Transportation (Non-Medical): No  Physical Activity: Sufficiently Active (03/14/2022)   Exercise Vital Sign    Days of Exercise per Week: 5 days  Minutes of Exercise per Session: 30 min  Stress: Stress Concern Present (03/14/2022)   [employer] of Occupational Health - Occupational Stress Questionnaire    Feeling of Stress : To some extent  Social Connections: Socially Integrated (03/14/2022)   Social Connection and Isolation Panel    Frequency of Communication with Friends and Family: More than three times a week    Frequency of Social Gatherings with Friends and Family: More than three times a week    Attends Religious Services: More than 4 times per year    Active Member of [employer] or Organizations: Yes    Attends Engineer, structural: More than 4 times per year    Marital Status: Married    Allergies:  Allergies  Allergen Reactions   Penicillins Anaphylaxis   Sulfonamide Derivatives Anaphylaxis    Metabolic Disorder Labs: Lab Results  Component Value Date   HGBA1C 6.2 (A) 12/24/2023   No results found for: PROLACTIN Lab Results  Component Value Date   CHOL 218 (H) 06/22/2023   TRIG 67 06/22/2023   HDL 110 06/22/2023   CHOLHDL 2.0 06/22/2023   VLDL 86.5 11/10/2022   LDLCALC 96 06/22/2023   LDLCALC 73 11/10/2022   Lab Results  Component Value Date   TSH 0.71 06/07/2021   TSH 1.78 06/13/2018    Therapeutic Level Labs: No results found for: LITHIUM No results found for: VALPROATE No results found for: CBMZ  Current Medications: Current Outpatient Medications  Medication Sig Dispense Refill   Accu-Chek Softclix Lancets lancets Use as instructed  to check blood sugar 4 times per day dx code E10.65 400 each 1   Alcohol Swabs (B-D SINGLE USE SWABS REGULAR) PADS USE FOUR TIMES DAILY 400 each 2   aspirin EC 81 MG tablet Take 81 mg by mouth every morning.     B-D ULTRAFINE III SHORT PEN 31G X 8 MM MISC USE TO INJECT WITH PRE-FILLED PEN SYRINGE SUB-Q DAILY 400 each 2   Blood Glucose Calibration (ACCU-CHEK AVIVA) SOLN USE AS DIRECTED 1 each 1   Blood Glucose Monitoring Suppl (ACCU-CHEK AVIVA PLUS) w/Device KIT USE AS DIRECTED 1 kit 0   cetirizine  (ZYRTEC ) 10 MG tablet Take 1 tablet (10 mg total) by mouth daily. 90 tablet 3   Clobetasol Propionate 0.05 % shampoo Apply topically.     Continuous Glucose Receiver (FREESTYLE LIBRE 3 READER) DEVI 1 Device by Does not apply route continuous. 1 each 0   Continuous Glucose Sensor (FREESTYLE LIBRE 3 PLUS SENSOR) MISC 1 each by Does not apply route continuous. Change every 15 days. 6 each 3   cyclobenzaprine  (FLEXERIL ) 5 MG tablet Take 1 tablet (5 mg total) by mouth 3 (three) times daily as needed for muscle spasms. 30 tablet 1   dapagliflozin  propanediol (FARXIGA ) 10 MG TABS tablet Take 1 tablet (10 mg total) by mouth daily. 30 tablet 11   donepezil  (ARICEPT ) 10 MG tablet TAKE 1 TABLET BY MOUTH AT NIGHT 60 tablet 5   ezetimibe  (ZETIA ) 10 MG tablet Take 1 tablet (10 mg total) by mouth daily. 90 tablet 3   fexofenadine (ALLEGRA) 180 MG tablet Take 180 mg by mouth daily.     gabapentin  (NEURONTIN ) 300 MG capsule Take 1 capsule (300 mg total) by mouth 3 (three) times daily. 90 capsule 5   Glucagon  (GVOKE HYPOPEN  2-PACK) 1 MG/0.2ML SOAJ INJECT 1 MG UNDER THE SKIN AS NEEDED FOR LOW BLOOD SUGAR. 0.4 mL 2   glucose blood (ACCU-CHEK AVIVA PLUS) test strip  CHECK BLOOD SUGAR 4 TIMES DAILY 400 strip 3   hyoscyamine  (LEVBID ) 0.375 MG 12 hr tablet Take 1 tablet (0.375 mg total) by mouth 2 (two) times daily. 60 tablet 11   insulin  glargine (LANTUS ) 100 UNIT/ML Solostar Pen Take 5 units in the morning and 5 units at  bedtime. 15 mL 4   insulin  lispro (HUMALOG  KWIKPEN) 100 UNIT/ML KwikPen INJECT SUBCUTANEOUSLY 2-5  UNITS 3 TIMES DAILY AS INSTRUCTED MAXIMUM 20 UNITS A DAY. 30 mL 4   Insulin  Pen Needle 32G X 4 MM MISC Use inject insulin  tid 300 each 2   Insulin  Pen Needle 32G X 4 MM MISC Use 4x a day 300 each 3   Insulin  Syringe-Needle U-100 (B-D INSULIN  SYRINGE) 31G X 5/16 0.3 ML MISC Use to administer insulin  10 each 2   lamoTRIgine  (LAMICTAL ) 100 MG tablet Take 1 tablet (100 mg total) by mouth 2 (two) times daily. 60 tablet 1   Lancets MISC Use to check blood sugar once a day 100 each 3   lipase/protease/amylase (CREON ) 36000 UNITS CPEP capsule Take 2 capsules by mouth three times a day with meals and 1 capsule by mouth twice daily with snacks 240 capsule 11   Magnesium  500 MG TABS Take 1 tablet by mouth daily.     metoprolol  succinate (TOPROL -XL) 25 MG 24 hr tablet TAKE 1 TABLET(25 MG) BY MOUTH DAILY 90 tablet 3   Multiple Vitamin (MULTIVITAMIN) tablet Take 1 tablet by mouth daily. Reported on 11/22/2015     Omega-3 Fatty Acids (FISH OIL) 1000 MG CAPS Take 1 capsule by mouth daily.     omeprazole  (PRILOSEC) 40 MG capsule TAKE 1 CAPSULE(40 MG) BY MOUTH IN THE MORNING AND AT BEDTIME 180 capsule 0   rosuvastatin  (CRESTOR ) 40 MG tablet Take 1 tablet (40 mg total) by mouth daily. 90 tablet 3   traZODone  (DESYREL ) 150 MG tablet Take 1 tablet (150 mg total) by mouth at bedtime. 30 tablet 1   triamcinolone  cream (KENALOG) 0.1 %      umeclidinium-vilanterol (ANORO ELLIPTA ) 62.5-25 MCG/ACT AEPB INHALE 1 PUFF INTO THE LUNGS DAILY 60 each 5   vitamin E 1000 UNIT capsule Take 1,000 Units by mouth daily.     hydrOXYzine  (ATARAX ) 10 MG tablet Take 1 tablet (10 mg total) by mouth 2 (two) times daily between meals as needed for anxiety. (Patient not taking: Reported on 03/27/2024) 60 tablet 0   No current facility-administered medications for this visit.     Musculoskeletal: Strength & Muscle Tone: within normal  limits Gait & Station: normal Patient leans: N/A  Psychiatric Specialty Exam: Review of Systems  Constitutional:  Positive for fatigue.  Neurological:  Positive for headaches.  Psychiatric/Behavioral:  Positive for sleep disturbance. The patient is nervous/anxious.     Blood pressure (!) 156/67, pulse 72, height 5' 3.5 (1.613 m), weight 113 lb (51.3 kg).Body mass index is 19.7 kg/m.  General Appearance: Casual  Eye Contact:  Fair  Speech:  fast  Volume:  Decreased  Mood:  Anxious  Affect:  Labile  Thought Process:  Descriptions of Associations: Intact  Orientation:  Full (Time, Place, and Person)  Thought Content: Rumination   Suicidal Thoughts:  No  Homicidal Thoughts:  No  Memory:  Immediate;   Good Recent;   Fair Remote;   Fair  Judgement:  Intact  Insight:  Shallow  Psychomotor Activity:  Restlessness  Concentration:  Concentration: Fair and Attention Span: Fair  Recall:  [employer] of Knowledge: Fair  Language: Good  Akathisia:  No  Handed:  Right  AIMS (if indicated): not done  Assets:  Communication Skills Desire for Improvement Housing Social Support Transportation  ADL's:  Intact  Cognition: WNL  Sleep:  Fair   Screenings: AUDIT    Flowsheet Row Clinical Support from 03/14/2022 in [facility]  Alcohol Use Disorder Identification Test Final Score (AUDIT) 4   GAD-7    Flowsheet Row Office Visit from 12/31/2023 in [facility] [place] Office Visit from 08/16/2023 in [facility] Office Visit from 12[facility]16/2020 in [facility] Office Visit from 06/17/2019 in [facility] Office Visit from 11/27/2017 in [facility]  Total GAD-7 Score 18 9 17 19 18    Mini-Mental    Flowsheet Row Office Visit from 01/08/2024 in [facility] Office Visit from 08/30/2023 in [facility] Office Visit from 02/27/2023 in [facility] Office Visit from 08/23/2022 in [facility] Office Visit from 12/03/2020 in [facility]  Total Score (max 30 points ) 30 30 28 30 22    PHQ2-9    Flowsheet Row Office Visit from 12/31/2023 in [facility] Office Visit from 08[facility]16/2023 in [facility] Visit from 06/21/2023 in [facility] [place] [facility] Visit from 12/05/2022 in [facility] [place] [facility]  PHQ-2 Total Score 3 2 0 0 3  PHQ-9 Total Score 15 8 0 -- 19   Flowsheet Row ED from 12/24/2022 in [facility] at [facility] Office Visit from [facility]/05[facility] from 06/14/2022 in [facility] Emergency Department at [facility]  C-SSRS RISK CATEGORY No Risk Error: Q3, 4, or 5 should not be populated when Q2 is No No Risk     Assessment and Plan: Patient is 68 year old female with history of migraine headaches, coronary artery disease, mitral valve stenosis, GERD, diabetes mellitus, social anxiety disorder, major depressive disorder, insomnia and restlessness.  Review current medication.  We have tried multiple medication recently Cymbalta , Lexapro , hydroxyzine .  Review results of GeneSight testing.  Most of the medication are favorable other than Cymbalta  due to smoking.  Recommend to try nortriptyline  that can help her anxiety, insomnia and depression.  Discontinue hydroxyzine  as patient not taking.  Recommend to cut down the trazodone  from 150 mg to only 50 mg and start nortriptyline  25 mg at bedtime.  Will cross-taper and if nortriptyline  works we may discontinue trazodone  in the future.  I encourage should get blood work including vitamin levels as patient  complaining of chronic fatigue and tiredness.  She has appointment coming up with PCP for blood work in December.  She checks her blood sugar every day.  She has no rash or any itching.  I have provided the copy of the GeneSight testing results so she can keep if needed in the future.  Recommend to call back if she has any question or any concern.  Follow-up in 2 months.  Patient not interested in therapy.  Collaboration of Care: Collaboration of Care: Other provider involved in patient's care AEB notes are available in epic to review  Patient/Guardian was advised Release of Information must be obtained prior to any record release in order  to collaborate their care with an outside provider. Patient/Guardian was advised if they have not already done so to contact the registration department to sign all necessary forms in order for us  to release information regarding their care.   Consent: Patient/Guardian gives verbal consent for treatment and assignment of benefits for services provided during this visit. Patient/Guardian expressed understanding and agreed to proceed.    Leni ONEIDA Client, MD 03/27/2024, 8:49 AM

## 2024-03-31 ENCOUNTER — Ambulatory Visit: Admitting: Orthopedic Surgery

## 2024-03-31 ENCOUNTER — Ambulatory Visit
Admission: RE | Admit: 2024-03-31 | Discharge: 2024-03-31 | Disposition: A | Discharge status: Home / Self Care | Source: Ambulatory Visit | Attending: Acute Care | Admitting: Acute Care

## 2024-03-31 DIAGNOSIS — M65331 Trigger finger, right middle finger: Secondary | ICD-10-CM

## 2024-03-31 DIAGNOSIS — Z122 Encounter for screening for malignant neoplasm of respiratory organs: Secondary | ICD-10-CM

## 2024-03-31 DIAGNOSIS — F17210 Nicotine dependence, cigarettes, uncomplicated: Secondary | ICD-10-CM

## 2024-03-31 DIAGNOSIS — Z87891 Personal history of nicotine dependence: Secondary | ICD-10-CM

## 2024-03-31 NOTE — Progress Notes (Signed)
 Leslie Lane - 68 y.o. female MRN [id number]  Date of birth: May 29, 1956  Office Visit Note: Visit Date: 03/31/2024 PCP: Rollene Almarie LABOR, MD Referred by: Joane Artist RAMAN, MD  Subjective: No chief complaint on file.  HPI: Leslie Lane is a pleasant 68 y.o. female who presents today for right hand pain ongoing for 9 months or more. She states that the finger will lock up on her daily. She has had an injection in the past with symptoms refractory conservative care.  She states that she is having ongoing clicking and locking often requiring manual correction.  She is here today for specific hand surgical evaluation.  Pertinent ROS were reviewed with the patient and found to be negative unless otherwise specified above in HPI.   Visit Reason: right long finger Duration of symptoms:9 months Hand dominance: right Occupation: retired Diabetic: Yes Smoking: Yes Heart/Lung History: yes Blood Thinners: baby aspirin  Prior Testing/EMG: none Injections (Date): 06/2023 Treatments: injection Prior Surgery: none    Assessment & Plan: Visit Diagnoses:  1. Trigger finger, right middle finger     Plan: Extensive discussion was had with the patient today regarding her right long finger trigger digit.  We discussed the etiology and pathophysiology of stenosing tenosynovitis.  We discussed conservative versus surgical treatment modalities.  From a conservative standpoint, we discussed activity modification, splinting, therapy and injections.  From a surgical standpoint, we discussed the possibility for trigger digit release as well as all risk and benefits associated.  Given that patient has trialed conservative treatments such as activity modification and previous injection with symptoms refractory to conservative care, patient is indicated for right long finger trigger digit release.    Risks and benefits of the procedure were discussed, risks including but not limited to infection,  bleeding, scarring, stiffness, nerve injury, tendon injury, vascular injury, recurrence of symptoms and need for subsequent operation.  We also discussed the appropriate postoperative protocol and timeframe for return to activities and function.  Forms of anesthesia were also discussed.  Patient expressed understanding.  Understanding the above, she would like to proceed with right long finger trigger digit release under IV sedation.   Follow-up: No follow-ups on file.   Meds & Orders: No orders of the defined types were placed in this encounter.  No orders of the defined types were placed in this encounter.    Procedures: No procedures performed      Clinical History: No specialty comments available.  She reports that she has been smoking cigarettes. She has a 75 pack-year smoking history. She has never used smokeless tobacco.  Recent Labs    06/21/23 0906 09/26/23 1404 12/24/23 1413  HGBA1C 7.3* 6.8* 6.2*    Objective:   Vital Signs: There were no vitals taken for this visit.  Physical Exam  Gen: Well-appearing, in no acute distress; non-toxic CV: Regular Rate. Well-perfused. Warm.  Resp: Breathing unlabored on room air; no wheezing. Psych: Fluid speech in conversation; appropriate affect; normal thought process  Ortho Exam Right hand: - Palpable nodule at the A1 pulley of the long finger, associated tenderness - Notable clicking with deep flexion of the long finger, there is  evidence of significant locking with deep flexion - Sensation intact distally, hand remains warm well-perfused   Imaging: No results found.  Past Medical/Family/Surgical/Social History: Medications & Allergies reviewed per EMR, new medications updated. Patient Active Problem List   Diagnosis Date Noted   Coronary artery calcification 12/20/2023   Nonrheumatic mitral (valve) stenosis  12/20/2023   Dysuria 06/21/2023   Atypical chest pain 12/06/2022   Cardiac calcification (HCC) 12/06/2022    Left hip pain 12/23/2021   Pancreatic insufficiency 04/29/2021   Aortic atherosclerosis 04/29/2021   History of substance abuse 03/15/2021   Mild neurocognitive disorder due to multiple etiologies 03/15/2021   Fibromyalgia    Hypoglycemia unawareness associated with type 1 diabetes mellitus 11/23/2020   Acquired trigger finger of left ring finger 02/18/2019   Right shoulder pain 02/18/2018   Protein calorie malnutrition 06/01/2017   Encounter for general adult medical examination with abnormal findings 06/01/2017   WBC decreased 03/27/2017   Diabetic gastroparesis associated with type 1 diabetes mellitus 10/24/2016   Abdominal pain 02/28/2016   Insomnia 04/13/2015   Major depressive disorder 07/28/2014   Nicotine dependence 07/28/2014   Generalized anxiety disorder 04/27/2014   Abnormal CT scan, bladder    Type 1 diabetes mellitus with complication 02/06/2013   Hepatitis B virus infection 07/05/2010   Mixed diabetic hyperlipidemia associated with type 1 diabetes mellitus (HCC) 07/05/2010   Bipolar disorder 07/05/2010   Smokers' cough (HCC) 07/05/2010   Migraine headache 07/05/2010   Allergic rhinitis 07/05/2010   GERD (gastroesophageal reflux disease) 07/05/2010   Chronic lower back pain 07/05/2010   Past Medical History:  Diagnosis Date   Abdominal pain 02/28/2016   Abnormal CT scan, bladder    Noted 07/28/13 CT - s/p uro eval 08/2013 [place] -    Acquired trigger finger of left ring finger 02/18/2019   Injected February 18, 2019 and March 2022   Allergic rhinitis    Anemia    Arthritis    Bipolar disorder 07/05/2010   Cataract    Chronic lower back pain 07/05/2010   Chronic neck pain    Diabetic gastroparesis associated with type 1 diabetes mellitus 10/24/2016   Fibromyalgia    Generalized anxiety disorder 04/27/2014   GERD (gastroesophageal reflux disease) 07/05/2010   Hepatitis B virus infection 07/05/2010   History of substance abuse    opiates; cocaine    Hypoglycemia unawareness associated with type 1 diabetes mellitus 11/23/2020   Insomnia 04/13/2015   Major depressive disorder 07/28/2014   Migraine headache 07/05/2010   Mild neurocognitive disorder due to multiple etiologies 03/15/2021   Mixed diabetic hyperlipidemia associated with type 1 diabetes mellitus 07/05/2010   Neuropathy    Protein calorie malnutrition 06/01/2017   Right shoulder pain 02/18/2018   Seizures    pt states, if my blood sugar drops to the 20's, I convulse.  It hasn't happened in a long time.   Smokers' cough 07/05/2010   Tubular adenoma of colon 2018   Type 1 diabetes mellitus with complication 02/06/2013   WBC decreased 03/27/2017   Family History  Problem Relation Age of Onset   Arthritis Mother    Diverticulosis Mother    Kidney disease Mother    Hyperlipidemia Mother    Arthritis Father    Kidney disease Father    Kidney cancer Father    Bladder Cancer Sister    Colon cancer Sister 16   Celiac disease Sister    Heart attack Brother    Kidney cancer Paternal Aunt    Diabetes Niece    Hyperlipidemia Other    Anxiety disorder Neg Hx    Bipolar disorder Neg Hx    Depression Neg Hx    Breast cancer Neg Hx    Esophageal cancer Neg Hx    Stomach cancer Neg Hx    Rectal cancer Neg Hx  Past Surgical History:  Procedure Laterality Date   CESAREAN SECTION     COLONOSCOPY     TUBAL LIGATION     UPPER GASTROINTESTINAL ENDOSCOPY     Social History   Occupational History   Occupation: Disability  Tobacco Use   Smoking status: Every Day    Current packs/day: 1.50    Average packs/day: 1.5 packs/day for 50.0 years (75.0 ttl pk-yrs)    Types: Cigarettes   Smokeless tobacco: Never   Tobacco comments:    Reports she has cut back to a cartoon that now lasts 2 weeks.   Vaping Use   Vaping status: Former  Substance and Sexual Activity   Alcohol use: Yes    Alcohol/week: 7.0 standard drinks of alcohol    Types: 7 Cans of beer per week     Comment: 1 beer nightly   Drug use: Not Currently    Types: Cocaine    Comment: past opiate and cocaine abuse   Sexual activity: Not Currently    Birth control/protection: Surgical, Post-menopausal    Valeda Corzine Estela) Arlinda, M.D. [facility], Hand Surgery

## 2024-04-04 ENCOUNTER — Ambulatory Visit: Admitting: Endocrinology

## 2024-04-04 ENCOUNTER — Other Ambulatory Visit: Payer: Self-pay | Admitting: Acute Care

## 2024-04-04 DIAGNOSIS — F17210 Nicotine dependence, cigarettes, uncomplicated: Secondary | ICD-10-CM

## 2024-04-04 DIAGNOSIS — Z87891 Personal history of nicotine dependence: Secondary | ICD-10-CM

## 2024-04-04 DIAGNOSIS — Z122 Encounter for screening for malignant neoplasm of respiratory organs: Secondary | ICD-10-CM

## 2024-04-07 ENCOUNTER — Telehealth: Payer: Self-pay

## 2024-04-07 NOTE — Telephone Encounter (Signed)
   Pre-operative Risk Assessment    Patient Name: Leslie Lane  DOB: [birth year] MRN: [id number]   Date of last office visit: 12/21/23 GLENDIA FERRIER, PA-C Date of next office visit: NONE   Request for Surgical Clearance    Procedure:  RIGHT LONG TRIGGER DIGIT RELEASE  Date of Surgery:  Clearance TBD                                Surgeon:  GILDARDO ALDERTON, MD Surgeon's Group or Practice Name:  [facility] Phone number:  [phone] Fax number:  [phone]   Type of Clearance Requested:   - Medical  - Pharmacy:  Hold Aspirin 7 DAYS PRIOR   Type of Anesthesia:  MAC   Additional requests/questions:    Signed, Lucie DELENA Ku   04/07/2024, 5:26 PM

## 2024-04-09 ENCOUNTER — Telehealth: Payer: Self-pay

## 2024-04-09 NOTE — Telephone Encounter (Signed)
 Med Rec and Consent done     Patient Consent for Virtual Visit        Leslie Lane has provided verbal consent on 04/09/2024 for a virtual visit (video or telephone).   CONSENT FOR VIRTUAL VISIT FOR:  Leslie Lane  By participating in this virtual visit I agree to the following:  I hereby voluntarily request, consent and authorize [facility] and its employed or contracted physicians, physician assistants, nurse practitioners or other licensed health care professionals (the Practitioner), to provide me with telemedicine health care services (the "Services) as deemed necessary by the treating Practitioner. I acknowledge and consent to receive the Services by the Practitioner via telemedicine. I understand that the telemedicine visit will involve communicating with the Practitioner through live audiovisual communication technology and the disclosure of certain medical information by electronic transmission. I acknowledge that I have been given the opportunity to request an in-person assessment or other available alternative prior to the telemedicine visit and am voluntarily participating in the telemedicine visit.  I understand that I have the right to withhold or withdraw my consent to the use of telemedicine in the course of my care at any time, without affecting my right to future care or treatment, and that the Practitioner or I may terminate the telemedicine visit at any time. I understand that I have the right to inspect all information obtained and/or recorded in the course of the telemedicine visit and may receive copies of available information for a reasonable fee.  I understand that some of the potential risks of receiving the Services via telemedicine include:  Delay or interruption in medical evaluation due to technological equipment failure or disruption; Information transmitted may not be sufficient (e.g. poor resolution of images) to allow for appropriate medical  decision making by the Practitioner; and/or  In rare instances, security protocols could fail, causing a breach of personal health information.  Furthermore, I acknowledge that it is my responsibility to provide information about my medical history, conditions and care that is complete and accurate to the best of my ability. I acknowledge that Practitioner's advice, recommendations, and/or decision may be based on factors not within their control, such as incomplete or inaccurate data provided by me or distortions of diagnostic images or specimens that may result from electronic transmissions. I understand that the practice of medicine is not an exact science and that Practitioner makes no warranties or guarantees regarding treatment outcomes. I acknowledge that a copy of this consent can be made available to me via my patient portal [facility] MyChart), or I can request a printed copy by calling the office of [facility].    I understand that my insurance will be billed for this visit.   I have read or had this consent read to me. I understand the contents of this consent, which adequately explains the benefits and risks of the Services being provided via telemedicine.  I have been provided ample opportunity to ask questions regarding this consent and the Services and have had my questions answered to my satisfaction. I give my informed consent for the services to be provided through the use of telemedicine in my medical care

## 2024-04-09 NOTE — Telephone Encounter (Signed)
 Left message to call back to schedule tele preop appt per proep APP.

## 2024-04-09 NOTE — Telephone Encounter (Signed)
   Name: Leslie Lane  DOB: August 20, 1955  MRN: [id number]  Primary Cardiologist: Darryle ONEIDA Decent, MD   Preoperative team, please contact this patient and set up a phone call appointment for further preoperative risk assessment. Please obtain consent and complete medication review. Thank you for your help.  I confirm that guidance regarding antiplatelet and oral anticoagulation therapy has been completed and, if necessary, noted below.  Per office protocol, if patient is without any new symptoms or concerns at the time of their virtual visit, she may hold ASA for 7 days prior to procedure. Please resume ASA as soon as possible postprocedure, at the discretion of the surgeon.    I also confirmed the patient resides in the state of [place] . As per [facility] Medical Board telemedicine laws, the patient must reside in the state in which the provider is licensed.   Lamarr Satterfield, NP 04/09/2024, 8:49 AM [facility]

## 2024-04-09 NOTE — Telephone Encounter (Signed)
 Ok to proceed without further testing. Ok to hold aspirin 7 days.

## 2024-04-09 NOTE — Telephone Encounter (Signed)
 S/W pt and scheduled TELE preop appt 04/15/24. Med Rec and Consent done   Will update the surgeons office.

## 2024-04-14 NOTE — Telephone Encounter (Signed)
 Forms completed and faxed, confirmation fax received

## 2024-04-15 ENCOUNTER — Ambulatory Visit: Attending: Cardiology | Admitting: Physician Assistant

## 2024-04-15 ENCOUNTER — Telehealth: Payer: Self-pay

## 2024-04-15 DIAGNOSIS — Z01810 Encounter for preprocedural cardiovascular examination: Secondary | ICD-10-CM | POA: Diagnosis not present

## 2024-04-15 NOTE — Progress Notes (Signed)
 Virtual Visit via Telephone Note   Because of Leslie Lane co-morbid illnesses, she is at least at moderate risk for complications without adequate follow up.  This format is felt to be most appropriate for this patient at this time.  Due to technical limitations with video connection (technology), today's appointment will be conducted as an audio only telehealth visit, and Leslie Lane verbally agreed to proceed in this manner.   All issues noted in this document were discussed and addressed.  No physical exam could be performed with this format.  Evaluation Performed:  Preoperative cardiovascular risk assessment _____________   Date:  04/15/2024   Patient ID:  Leslie Lane, DOB [birth year], MRN [id number] Patient Location:  Home Provider location:   Office  Primary Care Provider:  Rollene Almarie LABOR, MD Primary Cardiologist:  Darryle ONEIDA Decent, MD  Chief Complaint / Patient Profile   68 y.o. y/o female with a h/o coronary artery disease, mitral stenosis, diabetes mellitus type 1, history of cocaine use, history of tobacco use, aortic atherosclerosis/aortic atheroma on TTE in 01/2023 who is pending right long trigger digit and presents today for telephonic preoperative cardiovascular risk assessment.  History of Present Illness    Leslie Lane is a 69 y.o. female who presents via audio/video conferencing for a telehealth visit today.  Pt was last seen in cardiology clinic on 12/21/2023 by Glendia Ferrier, PA-C.  At that time Leslie Lane was doing well.  The patient is now pending procedure as outlined above. Since her last visit, she states that she has not had any new issues with her heart.  No chest pain or shortness of breath.  Her echocardiogram earlier this year demonstrated moderate mitral stenosis which is stable compared to previous echocardiograms.  She states sometimes her sugar can drop when she goes on walks but she tries to remain as active as possible.  She does  surpass 4 METS on the DASI.  Per office protocol, she may hold ASA for 7 days prior to procedure. Please resume ASA as soon as possible postprocedure, at the discretion of the surgeon.    01/31/2024    ECHOCARDIOGRAM REPORT       Patient Name:   Leslie Lane  Date of Exam: 01/31/2024 Medical Rec #:  [id number]       Height:       63.5 in Accession #:    [id number]      Weight:       112.0 lb Date of Birth:  11-16-1955      BSA:          1.520 m Patient Age:    64 years        BP:           132/74 mmHg Patient Gender: F               HR:           57 bpm. Exam Location:  [address]  Procedure: 2D Echo, Cardiac Doppler and Color Doppler (Both Spectral and Color            Flow Doppler were utilized during procedure).  Indications:    Nonrheumatic mitral (valve) stenosis [P65.7 (ICD-10-CM)]   History:        Patient has prior history of Echocardiogram examinations, most                 recent 01/08/2023.   Sonographer:    Rosaline Fujisawa  MHA, RDMS, RVT, RDCS Referring Phys: [id number] GLENDIA DASEN WEAVER  IMPRESSIONS    1. Left ventricular ejection fraction, by estimation, is 60 to 65%. The left ventricle has normal function. The left ventricle has no regional wall motion abnormalities. There is mild left ventricular hypertrophy. Left ventricular diastolic parameters are consistent with Grade I diastolic dysfunction (impaired relaxation).  2. Right ventricular systolic function is normal. The right ventricular size is normal. There is normal pulmonary artery systolic pressure. The estimated right ventricular systolic pressure is 22.7 mmHg.  3. Left atrial size was mildly dilated.  4. The mitral valve is degenerative. Trivial mitral valve regurgitation. Moderate mitral stenosis. The mean mitral valve gradient is 7.0 mmHg with average heart rate of 61 bpm. Moderate to severe mitral annular calcification.  5. The aortic valve is tricuspid. Aortic valve regurgitation is  trivial. Aortic valve sclerosis is present, with no evidence of aortic valve stenosis.  6. The inferior vena cava is normal in size with greater than 50% respiratory variability, suggesting right atrial pressure of 3 mmHg.  Comparison(s): No significant change from prior study. 01/08/2023: LVEF 55-60%, grade 1 DD, moderate LAE, moderate MS - MG 7 mmHg.  FINDINGS  Left Ventricle: Left ventricular ejection fraction, by estimation, is 60 to 65%. The left ventricle has normal function. The left ventricle has no regional wall motion abnormalities. The left ventricular internal cavity size was normal in size. There is  mild left ventricular hypertrophy. Left ventricular diastolic parameters are consistent with Grade I diastolic dysfunction (impaired relaxation). Indeterminate filling pressures.  Right Ventricle: The right ventricular size is normal. No increase in right ventricular wall thickness. Right ventricular systolic function is normal. There is normal pulmonary artery systolic pressure. The tricuspid regurgitant velocity is 2.22 m/s, and  with an assumed right atrial pressure of 3 mmHg, the estimated right ventricular systolic pressure is 22.7 mmHg.    Past Medical History    Past Medical History:  Diagnosis Date   Abdominal pain 02/28/2016   Abnormal CT scan, bladder    Noted 07/28/13 CT - s/p uro eval 08/2013 [place] -    Acquired trigger finger of left ring finger 02/18/2019   Injected February 18, 2019 and March 2022   Allergic rhinitis    Anemia    Arthritis    Bipolar disorder 07/05/2010   Cataract    Chronic lower back pain 07/05/2010   Chronic neck pain    Diabetic gastroparesis associated with type 1 diabetes mellitus 10/24/2016   Fibromyalgia    Generalized anxiety disorder 04/27/2014   GERD (gastroesophageal reflux disease) 07/05/2010   Hepatitis B virus infection 07/05/2010   History of substance abuse    opiates; cocaine   Hypoglycemia unawareness associated  with type 1 diabetes mellitus 11/23/2020   Insomnia 04/13/2015   Major depressive disorder 07/28/2014   Migraine headache 07/05/2010   Mild neurocognitive disorder due to multiple etiologies 03/15/2021   Mixed diabetic hyperlipidemia associated with type 1 diabetes mellitus 07/05/2010   Neuropathy    Protein calorie malnutrition 06/01/2017   Right shoulder pain 02/18/2018   Seizures    pt states, if my blood sugar drops to the 20's, I convulse.  It hasn't happened in a long time.   Smokers' cough 07/05/2010   Tubular adenoma of colon 2018   Type 1 diabetes mellitus with complication 02/06/2013   WBC decreased 03/27/2017   Past Surgical History:  Procedure Laterality Date   CESAREAN SECTION     COLONOSCOPY  TUBAL LIGATION     UPPER GASTROINTESTINAL ENDOSCOPY      Allergies  Allergies  Allergen Reactions   Penicillins Anaphylaxis   Sulfonamide Derivatives Anaphylaxis    Home Medications    Prior to Admission medications   Medication Sig Start Date End Date Taking? Authorizing Provider  Accu-Chek Softclix Lancets lancets Use as instructed to check blood sugar 4 times per day dx code E10.65 01/28/20   [place], MD  Alcohol Swabs (B-D SINGLE USE SWABS REGULAR) PADS USE FOUR TIMES DAILY 01/28/20   [place], MD  aspirin EC 81 MG tablet Take 81 mg by mouth every morning.    [provider]  B-D ULTRAFINE III SHORT PEN 31G X 8 MM MISC USE TO INJECT WITH PRE-FILLED PEN SYRINGE SUB-Q DAILY 08/21/23   Thapa, [place], MD  Blood Glucose Calibration (ACCU-CHEK AVIVA) SOLN USE AS DIRECTED 01/28/20   [place], MD  Blood Glucose Monitoring Suppl (ACCU-CHEK AVIVA PLUS) w/Device KIT USE AS DIRECTED 12/19/16   [place], MD  cetirizine  (ZYRTEC ) 10 MG tablet Take 1 tablet (10 mg total) by mouth daily. 12/31/23   Rollene Almarie LABOR, MD  Clobetasol Propionate 0.05 % shampoo Apply topically. 11/16/21   [provider]  Continuous Glucose Receiver (FREESTYLE LIBRE 3  READER) DEVI 1 Device by Does not apply route continuous. 10/25/23   Thapa, [place], MD  Continuous Glucose Sensor (FREESTYLE LIBRE 3 PLUS SENSOR) MISC 1 each by Does not apply route continuous. Change every 15 days. 12/24/23   Thapa, [place], MD  cyclobenzaprine  (FLEXERIL ) 5 MG tablet Take 1 tablet (5 mg total) by mouth 3 (three) times daily as needed for muscle spasms. 12/05/22   Rollene Almarie LABOR, MD  dapagliflozin  propanediol (FARXIGA ) 10 MG TABS tablet Take 1 tablet (10 mg total) by mouth daily. 12/15/22   Alvan Ronal BRAVO, MD  donepezil  (ARICEPT ) 10 MG tablet TAKE 1 TABLET BY MOUTH AT NIGHT 03/11/24   Wertman, Sara E, PA-C  ezetimibe  (ZETIA ) 10 MG tablet Take 1 tablet (10 mg total) by mouth daily. 01/02/24   Lelon Hamilton T, PA-C  fexofenadine (ALLEGRA) 180 MG tablet Take 180 mg by mouth daily.    [provider]  gabapentin  (NEURONTIN ) 300 MG capsule Take 1 capsule (300 mg total) by mouth 3 (three) times daily. 12/31/23   Rollene Almarie LABOR, MD  Glucagon  (GVOKE HYPOPEN  2-PACK) 1 MG/0.2ML SOAJ INJECT 1 MG UNDER THE SKIN AS NEEDED FOR LOW BLOOD SUGAR. 11/30/23   Thapa, [place], MD  glucose blood (ACCU-CHEK AVIVA PLUS) test strip CHECK BLOOD SUGAR 4 TIMES DAILY 03/27/24   Thapa, [place], MD  hyoscyamine  (LEVBID ) 0.375 MG 12 hr tablet Take 1 tablet (0.375 mg total) by mouth 2 (two) times daily. 10/19/21   Aneita Gwendlyn DASEN, MD  insulin  glargine (LANTUS ) 100 UNIT/ML Solostar Pen Take 5 units in the morning and 5 units at bedtime. 08/01/23   Thapa, [place], MD  insulin  lispro (HUMALOG  KWIKPEN) 100 UNIT/ML KwikPen INJECT SUBCUTANEOUSLY 2-5  UNITS 3 TIMES DAILY AS INSTRUCTED MAXIMUM 20 UNITS A DAY. 08/01/23   Thapa, [place], MD  Insulin  Pen Needle 32G X 4 MM MISC Use inject insulin  tid 03/03/21   [place], MD  Insulin  Pen Needle 32G X 4 MM MISC Use 4x a day 01/11/24   Thapa, [place], MD  Insulin  Syringe-Needle U-100 (B-D INSULIN  SYRINGE) 31G X 5/16 0.3 ML MISC Use to administer insulin  11/03/22   [place], MD   lamoTRIgine  (LAMICTAL ) 100 MG tablet Take 1  tablet (100 mg total) by mouth 2 (two) times daily. 03/27/24   Curry Leni DASEN, MD  Lancets MISC Use to check blood sugar once a day 03/19/24   Thapa, [place], MD  lipase/protease/amylase (CREON ) 36000 UNITS CPEP capsule Take 2 capsules by mouth three times a day with meals and 1 capsule by mouth twice daily with snacks 10/19/21   Aneita Gwendlyn DASEN, MD  Magnesium  500 MG TABS Take 1 tablet by mouth daily.    [provider]  metoprolol  succinate (TOPROL -XL) 25 MG 24 hr tablet TAKE 1 TABLET(25 MG) BY MOUTH DAILY 01/16/24   Lelon Hamilton T, PA-C  Multiple Vitamin (MULTIVITAMIN) tablet Take 1 tablet by mouth daily. Reported on 11/22/2015    [provider]  nortriptyline  (PAMELOR ) 25 MG capsule Take 1 capsule (25 mg total) by mouth at bedtime. 03/27/24 05/26/24  Arfeen, Leni DASEN, MD  Omega-3 Fatty Acids (FISH OIL) 1000 MG CAPS Take 1 capsule by mouth daily.    [provider]  omeprazole  (PRILOSEC) 40 MG capsule TAKE 1 CAPSULE(40 MG) BY MOUTH IN THE MORNING AND AT BEDTIME 01/31/24   Rollene Almarie LABOR, MD  rosuvastatin  (CRESTOR ) 40 MG tablet Take 1 tablet (40 mg total) by mouth daily. 06/26/23   Alvan Ronal BRAVO, MD  traZODone  (DESYREL ) 50 MG tablet Take 1 tablet (50 mg total) by mouth at bedtime. 03/27/24   Arfeen, Leni DASEN, MD  triamcinolone  cream (KENALOG) 0.1 %  12/04/21   [provider]  umeclidinium-vilanterol (ANORO ELLIPTA ) 62.5-25 MCG/ACT AEPB INHALE 1 PUFF INTO THE LUNGS DAILY 01/04/22   Hunsucker, Donnice SAUNDERS, MD  vitamin E 1000 UNIT capsule Take 1,000 Units by mouth daily.    [provider]    Physical Exam    Vital Signs:  Leslie Lane does not have vital signs available for review today.  Given telephonic nature of communication, physical exam is limited. AAOx3. NAD. Normal affect.  Speech and respirations are unlabored.  Accessory Clinical Findings    None  Assessment & Plan    1.  Preoperative  Cardiovascular Risk Assessment:  Leslie Lane perioperative risk of a major cardiac event is 0.9% according to the Revised Cardiac Risk Index (RCRI).  Therefore, she is at low risk for perioperative complications.   Her functional capacity is good at 5.62 METs according to the [facility] Activity Status Index (DASI). Recommendations: According to ACC/AHA guidelines, no further cardiovascular testing needed.  The patient may proceed to surgery at acceptable risk.   Antiplatelet and/or Anticoagulation Recommendations: Aspirin can be held for 7 days prior to her surgery.  Please resume Aspirin post operatively when it is felt to be safe from a bleeding standpoint.   The patient was advised that if she develops new symptoms prior to surgery to contact our office to arrange for a follow-up visit, and she verbalized understanding.   A copy of this note will be routed to requesting surgeon.  Time:   Today, I have spent 10 minutes with the patient with telehealth technology discussing medical history, symptoms, and management plan.     Orren LOISE Fabry, PA-C  04/15/2024, 10:43 AM

## 2024-04-15 NOTE — Telephone Encounter (Signed)
 Forms for Pr op clarence has been fax back successfully.

## 2024-04-21 ENCOUNTER — Ambulatory Visit: Admitting: Endocrinology

## 2024-04-21 ENCOUNTER — Encounter: Payer: Self-pay | Admitting: Endocrinology

## 2024-04-21 ENCOUNTER — Ambulatory Visit: Payer: Self-pay | Admitting: Endocrinology

## 2024-04-21 VITALS — BP 130/62 | HR 62 | Resp 20 | Ht 63.5 in | Wt 118.0 lb

## 2024-04-21 DIAGNOSIS — T383X5A Adverse effect of insulin and oral hypoglycemic [antidiabetic] drugs, initial encounter: Secondary | ICD-10-CM

## 2024-04-21 DIAGNOSIS — E108 Type 1 diabetes mellitus with unspecified complications: Secondary | ICD-10-CM

## 2024-04-21 DIAGNOSIS — E10649 Type 1 diabetes mellitus with hypoglycemia without coma: Secondary | ICD-10-CM | POA: Diagnosis not present

## 2024-04-21 DIAGNOSIS — E1065 Type 1 diabetes mellitus with hyperglycemia: Secondary | ICD-10-CM | POA: Diagnosis not present

## 2024-04-21 DIAGNOSIS — E160 Drug-induced hypoglycemia without coma: Secondary | ICD-10-CM | POA: Diagnosis not present

## 2024-04-21 LAB — POCT GLYCOSYLATED HEMOGLOBIN (HGB A1C): Hemoglobin A1C: 6.1 % — AB (ref 4.0–5.6)

## 2024-04-21 NOTE — Progress Notes (Signed)
 Outpatient Endocrinology Note [place] Brunilda Eble, MD  04/21/24  Patient's Name: Leslie Lane    DOB: May 05, 1956    MRN: [id number]                                                    REASON OF VISIT: Follow up of type 1 diabetes mellitus  PCP: Rollene Almarie LABOR, MD  HISTORY OF PRESENT ILLNESS:   Leslie Lane is a 68 y.o. old female with past medical history listed below, is here for follow up for type 1 diabetes mellitus.   Pertinent Diabetes History: She was diagnosed with type 1 diabetes mellitus in 1967.  Chronic Diabetes Complications : Retinopathy: no. Last ophthalmology exam was done on annually, following with ophthalmology regularly.  Nephropathy: no /microalbuminuria present Peripheral neuropathy: yes, on gabapentin  as needed.  Cymbalta . Coronary artery disease: no Stroke: no She has gastroparesis. Followed by gastroenterologist for abdominal pain and pancreatic insufficiency.  Relevant comorbidities and cardiovascular risk factors: Obesity: no Body mass index is 20.57 kg/m.  Hypertension: Yes  Hyperlipidemia : Yes, on statin   Current / Home Diabetic regimen includes: Lantus  6 units in the morning and 6 units at bedtime.  Humalog  1 -3  units with breakfast and lunch and 1-3 units units with supper.  She takes variable doses.  She is on Farxiga  10 mg daily by cardiology.  Prior diabetic medications:  Glycemic data:  CONTINUOUS GLUCOSE MONITORING SYSTEM (CGMS) INTERPRETATION:   FreeStyle Libre 3+ CGM-  Sensor Download (Sensor download was reviewed and summarized below.) Dates: October 7 to April 21, 2024, 14 days   % data captured: 94%    Impression: - Frequent hypoglycemia mostly blood sugar in 60s early morning fasting and in between the meals.  Rare hypoglycemia with blood sugar up to 280 range postprandially.  Few of the days acceptable blood sugar throughout the day.  Hypoglycemia: Patient has frequent hypoglycemic episodes. Patient has  hypoglycemia ? awareness, has a glucagon  ER kit.  Glucose tablets.  Factors modifying glucose control: 1.  Diabetic diet assessment: likes to eat junk food.   2.  Staying active or exercising: No formal exercise.  Physically active and walks multiple times a day.  3.  Medication compliance: compliant all of the time.  Interval history  CGM data as reviewed above, frequent hypoglycemia.  Diabetes regimen is reviewed and noted above.  Hemoglobin A1c today 6.1%.  She complains of numbness and tingling of the feet.  No other complaints today.  REVIEW OF SYSTEMS As per history of present illness.   PAST MEDICAL HISTORY: Past Medical History:  Diagnosis Date   Abdominal pain 02/28/2016   Abnormal CT scan, bladder    Noted 07/28/13 CT - s/p uro eval 08/2013 [place] -    Acquired trigger finger of left ring finger 02/18/2019   Injected February 18, 2019 and March 2022   Allergic rhinitis    Anemia    Arthritis    Bipolar disorder 07/05/2010   Cataract    Chronic lower back pain 07/05/2010   Chronic neck pain    Diabetic gastroparesis associated with type 1 diabetes mellitus 10/24/2016   Fibromyalgia    Generalized anxiety disorder 04/27/2014   GERD (gastroesophageal reflux disease) 07/05/2010   Hepatitis B virus infection 07/05/2010   History of substance abuse    opiates;  cocaine   Hypoglycemia unawareness associated with type 1 diabetes mellitus 11/23/2020   Insomnia 04/13/2015   Major depressive disorder 07/28/2014   Migraine headache 07/05/2010   Mild neurocognitive disorder due to multiple etiologies 03/15/2021   Mixed diabetic hyperlipidemia associated with type 1 diabetes mellitus 07/05/2010   Neuropathy    Protein calorie malnutrition 06/01/2017   Right shoulder pain 02/18/2018   Seizures    pt states, if my blood sugar drops to the 20's, I convulse.  It hasn't happened in a long time.   Smokers' cough 07/05/2010   Tubular adenoma of colon 2018   Type 1 diabetes  mellitus with complication 02/06/2013   WBC decreased 03/27/2017    PAST SURGICAL HISTORY: Past Surgical History:  Procedure Laterality Date   CESAREAN SECTION     COLONOSCOPY     TUBAL LIGATION     UPPER GASTROINTESTINAL ENDOSCOPY      ALLERGIES: Allergies  Allergen Reactions   Penicillins Anaphylaxis   Sulfonamide Derivatives Anaphylaxis    FAMILY HISTORY:  Family History  Problem Relation Age of Onset   Arthritis Mother    Diverticulosis Mother    Kidney disease Mother    Hyperlipidemia Mother    Arthritis Father    Kidney disease Father    Kidney cancer Father    Bladder Cancer Sister    Colon cancer Sister 19   Celiac disease Sister    Heart attack Brother    Kidney cancer Paternal Aunt    Diabetes Niece    Hyperlipidemia Other    Anxiety disorder Neg Hx    Bipolar disorder Neg Hx    Depression Neg Hx    Breast cancer Neg Hx    Esophageal cancer Neg Hx    Stomach cancer Neg Hx    Rectal cancer Neg Hx     SOCIAL HISTORY: Social History   Socioeconomic History   Marital status: Divorced    Spouse name: Not on file   Number of children: 1   Years of education: 12   Highest education level: High school graduate  Occupational History   Occupation: Disability  Tobacco Use   Smoking status: Every Day    Current packs/day: 1.50    Average packs/day: 1.5 packs/day for 50.0 years (75.0 ttl pk-yrs)    Types: Cigarettes   Smokeless tobacco: Never   Tobacco comments:    Reports she has cut back to a cartoon that now lasts 2 weeks.   Vaping Use   Vaping status: Former  Substance and Sexual Activity   Alcohol use: Yes    Alcohol/week: 7.0 standard drinks of alcohol    Types: 7 Cans of beer per week    Comment: 1 beer nightly   Drug use: Not Currently    Types: Cocaine    Comment: past opiate and cocaine abuse   Sexual activity: Not Currently    Birth control/protection: Surgical, Post-menopausal  Other Topics Concern   Not on file  Social History  Narrative   Divorced, disabled   Lives with parents   Right handed    Retired   Caffeine prn   Social Drivers of Health   Financial Resource Strain: Low Risk  (03/14/2022)   Overall Financial Resource Strain (CARDIA)    Difficulty of Paying Living Expenses: Not hard at all  Food Insecurity: No Food Insecurity (05/23/2023)   Hunger Vital Sign    Worried About Running Out of Food in the Last Year: Never true    Ran  Out of Food in the Last Year: Never true  Transportation Needs: No Transportation Needs (05/23/2023)   PRAPARE - Administrator, Civil Service (Medical): No    Lack of Transportation (Non-Medical): No  Physical Activity: Sufficiently Active (03/14/2022)   Exercise Vital Sign    Days of Exercise per Week: 5 days    Minutes of Exercise per Session: 30 min  Stress: Stress Concern Present (03/14/2022)   [employer] of Occupational Health - Occupational Stress Questionnaire    Feeling of Stress : To some extent  Social Connections: Socially Integrated (03/14/2022)   Social Connection and Isolation Panel    Frequency of Communication with Friends and Family: More than three times a week    Frequency of Social Gatherings with Friends and Family: More than three times a week    Attends Religious Services: More than 4 times per year    Active Member of [employer] or Organizations: Yes    Attends Engineer, structural: More than 4 times per year    Marital Status: Married    MEDICATIONS:  Current Outpatient Medications  Medication Sig Dispense Refill   Accu-Chek Softclix Lancets lancets Use as instructed to check blood sugar 4 times per day dx code E10.65 400 each 1   Alcohol Swabs (B-D SINGLE USE SWABS REGULAR) PADS USE FOUR TIMES DAILY 400 each 2   aspirin EC 81 MG tablet Take 81 mg by mouth every morning.     B-D ULTRAFINE III SHORT PEN 31G X 8 MM MISC USE TO INJECT WITH PRE-FILLED PEN SYRINGE SUB-Q DAILY 400 each 2   Blood Glucose Calibration (ACCU-CHEK  AVIVA) SOLN USE AS DIRECTED 1 each 1   Blood Glucose Monitoring Suppl (ACCU-CHEK AVIVA PLUS) w/Device KIT USE AS DIRECTED 1 kit 0   cetirizine  (ZYRTEC ) 10 MG tablet Take 1 tablet (10 mg total) by mouth daily. 90 tablet 3   Clobetasol Propionate 0.05 % shampoo Apply topically.     Continuous Glucose Receiver (FREESTYLE LIBRE 3 READER) DEVI 1 Device by Does not apply route continuous. 1 each 0   Continuous Glucose Sensor (FREESTYLE LIBRE 3 PLUS SENSOR) MISC 1 each by Does not apply route continuous. Change every 15 days. 6 each 3   cyclobenzaprine  (FLEXERIL ) 5 MG tablet Take 1 tablet (5 mg total) by mouth 3 (three) times daily as needed for muscle spasms. 30 tablet 1   dapagliflozin  propanediol (FARXIGA ) 10 MG TABS tablet Take 1 tablet (10 mg total) by mouth daily. 30 tablet 11   donepezil  (ARICEPT ) 10 MG tablet TAKE 1 TABLET BY MOUTH AT NIGHT 60 tablet 5   ezetimibe  (ZETIA ) 10 MG tablet Take 1 tablet (10 mg total) by mouth daily. 90 tablet 3   fexofenadine (ALLEGRA) 180 MG tablet Take 180 mg by mouth daily.     gabapentin  (NEURONTIN ) 300 MG capsule Take 1 capsule (300 mg total) by mouth 3 (three) times daily. 90 capsule 5   Glucagon  (GVOKE HYPOPEN  2-PACK) 1 MG/0.2ML SOAJ INJECT 1 MG UNDER THE SKIN AS NEEDED FOR LOW BLOOD SUGAR. 0.4 mL 2   glucose blood (ACCU-CHEK AVIVA PLUS) test strip CHECK BLOOD SUGAR 4 TIMES DAILY 400 strip 3   hyoscyamine  (LEVBID ) 0.375 MG 12 hr tablet Take 1 tablet (0.375 mg total) by mouth 2 (two) times daily. 60 tablet 11   insulin  glargine (LANTUS ) 100 UNIT/ML Solostar Pen Take 5 units in the morning and 5 units at bedtime. 15 mL 4   insulin  lispro (HUMALOG   KWIKPEN) 100 UNIT/ML KwikPen INJECT SUBCUTANEOUSLY 2-5  UNITS 3 TIMES DAILY AS INSTRUCTED MAXIMUM 20 UNITS A DAY. 30 mL 4   Insulin  Pen Needle 32G X 4 MM MISC Use inject insulin  tid 300 each 2   Insulin  Pen Needle 32G X 4 MM MISC Use 4x a day 300 each 3   Insulin  Syringe-Needle U-100 (B-D INSULIN  SYRINGE) 31G X 5/16 0.3  ML MISC Use to administer insulin  10 each 2   lamoTRIgine  (LAMICTAL ) 100 MG tablet Take 1 tablet (100 mg total) by mouth 2 (two) times daily. 60 tablet 1   Lancets MISC Use to check blood sugar once a day 100 each 3   lipase/protease/amylase (CREON ) 36000 UNITS CPEP capsule Take 2 capsules by mouth three times a day with meals and 1 capsule by mouth twice daily with snacks 240 capsule 11   Magnesium  500 MG TABS Take 1 tablet by mouth daily.     metoprolol  succinate (TOPROL -XL) 25 MG 24 hr tablet TAKE 1 TABLET(25 MG) BY MOUTH DAILY 90 tablet 3   Multiple Vitamin (MULTIVITAMIN) tablet Take 1 tablet by mouth daily. Reported on 11/22/2015     nortriptyline  (PAMELOR ) 25 MG capsule Take 1 capsule (25 mg total) by mouth at bedtime. 30 capsule 1   Omega-3 Fatty Acids (FISH OIL) 1000 MG CAPS Take 1 capsule by mouth daily.     omeprazole  (PRILOSEC) 40 MG capsule TAKE 1 CAPSULE(40 MG) BY MOUTH IN THE MORNING AND AT BEDTIME 180 capsule 0   rosuvastatin  (CRESTOR ) 40 MG tablet Take 1 tablet (40 mg total) by mouth daily. 90 tablet 3   traZODone  (DESYREL ) 50 MG tablet Take 1 tablet (50 mg total) by mouth at bedtime. 30 tablet 1   triamcinolone  cream (KENALOG) 0.1 %      umeclidinium-vilanterol (ANORO ELLIPTA ) 62.5-25 MCG/ACT AEPB INHALE 1 PUFF INTO THE LUNGS DAILY 60 each 5   vitamin E 1000 UNIT capsule Take 1,000 Units by mouth daily.     No current facility-administered medications for this visit.    PHYSICAL EXAM: Vitals:   04/21/24 1101  BP: 130/62  Pulse: 62  Resp: 20  SpO2: 98%  Weight: 118 lb (53.5 kg)  Height: 5' 3.5 (1.613 m)       Body mass index is 20.57 kg/m.  Wt Readings from Last 3 Encounters:  04/21/24 118 lb (53.5 kg)  03/10/24 113 lb (51.3 kg)  01/08/24 113 lb (51.3 kg)    General: Well developed, well nourished female in no apparent distress.  HEENT: AT/[place], no external lesions.  Eyes: Conjunctiva clear and no icterus. Neck: Neck supple  Lungs: Respirations not  labored Neurologic: Alert, oriented, normal speech Extremities / Skin: Dry.   Psychiatric: Does not appear depressed or anxious  Diabetic Foot Exam - Simple   No data filed    LABS Reviewed Lab Results  Component Value Date   HGBA1C 6.1 (A) 04/21/2024   HGBA1C 6.2 (A) 12/24/2023   HGBA1C 6.8 (A) 09/26/2023   Lab Results  Component Value Date   FRUCTOSAMINE 325 (H) 02/28/2021   FRUCTOSAMINE 322 (H) 10/11/2017   FRUCTOSAMINE 296 (H) 01/11/2016   Lab Results  Component Value Date   CHOL 218 (H) 06/22/2023   HDL 110 06/22/2023   LDLCALC 96 06/22/2023   LDLDIRECT 87 12/21/2023   TRIG 67 06/22/2023   CHOLHDL 2.0 06/22/2023   Lab Results  Component Value Date   MICRALBCREAT Unable to calculate 12/31/2023   Lab Results  Component Value Date  CREATININE 0.81 06/21/2023   Lab Results  Component Value Date   GFR 74.20 06/21/2023    ASSESSMENT / PLAN  1. Type 1 diabetes mellitus with complications (HCC)   2. Uncontrolled type 1 diabetes mellitus with hypoglycemia without coma (HCC)   3. Hypoglycemia due to insulin     Diabetes Mellitus type 1, complicated by diabetic neuropathy/gastroparesis. - Diabetic status / severity: Uncontrolled.  Frequent hypoglycemia.  Lab Results  Component Value Date   HGBA1C 6.1 (A) 04/21/2024    - Hemoglobin A1c goal : <7%  Patient has labile blood sugar with hypo and hyperglycemia.  She is ? not a good candidate for insulin  pump as she has difficulty with Dexcom sensor and likely will have ? difficulty learning insulin  pump operation.  Again discussed about insulin  pump therapy, patient wants to stay on the multidose insulin  regimen.  Discussed that hypoglycemia can be detrimental to the health including life-threatening.  - Medications: See below.  I) decrease from Lantus  to 6 units 2 times per day to 5 units in the morning and at bedtime.  II) Humalog  1-3 units with meals 3 times a day.  If you exercise do not take take humalog   for the meal as well. No more than 3 units at a time.    Asked to call our clinic if she continues to have low blood sugar, need to adjust insulin  regimen at that time.  Do not take humalog  around bedtime for glucose around 200 or less.   She is on Farxiga  10 mg daily by cardiology.  - Home glucose testing: continue freestyle libre CGM check blood sugar as needed.    - Discussed/ Gave Hypoglycemia treatment plan.  She uses glucose tablet and juice to correct hypoglycemia.  She has Glucagon  Emergency Kit at home.  # Consult : not required at this time.   # Annual urine for microalbuminuria/ creatinine ratio, no microalbuminuria currently. Last  Lab Results  Component Value Date   MICRALBCREAT Unable to calculate 12/31/2023    # Foot check nightly / neuropathy, continue gabapentin  as needed.  Try caspacian cream over-the-counter.  # Annual dilated diabetic eye exams.   - Diet: Eat reasonable portion sizes to promote a healthy weight.  Advised to avoid frequently snacking. - Life style / activity / exercise: Discussed.  2. Blood pressure  -  BP Readings from Last 1 Encounters:  04/21/24 130/62    - Control is in target.  - No change in current plans.  3. Lipid status / Hyperlipidemia - Last  Lab Results  Component Value Date   LDLCALC 96 06/22/2023   - Continue lovastatin  40 mg daily.  Managed by PCP.  Diagnoses and all orders for this visit:  Type 1 diabetes mellitus with complications (HCC) -     POCT glycosylated hemoglobin (Hb A1C)  Uncontrolled type 1 diabetes mellitus with hypoglycemia without coma (HCC)  Hypoglycemia due to insulin     DISPOSITION Follow up in clinic in 3 months suggested.   All questions answered and patient verbalized understanding of the plan.  [place] Adhira Jamil, MD [facility] Endocrinology [facility] [address] [place], [address] [place], [place] [address] Phone # [phone]  At least part of this note was generated using  voice recognition software. Inadvertent word errors may have occurred, which were not recognized during the proofreading process.

## 2024-04-21 NOTE — Patient Instructions (Addendum)
 Decrease Lantus  5 units in the morning and 5 units at bedtime.   Humalog  1-3 units with meals 3 times a day, if you exercise do not take take humalog  for the meal as well. No more than 3 units at a time.   Do not take humalog  around bedtime for glucose around 200 or less.   Use Free style libre CGM monitor.   You can try Capsician cream at night for neuropathy.

## 2024-04-28 ENCOUNTER — Other Ambulatory Visit: Payer: Self-pay | Admitting: Endocrinology

## 2024-04-28 DIAGNOSIS — E1065 Type 1 diabetes mellitus with hyperglycemia: Secondary | ICD-10-CM

## 2024-05-01 ENCOUNTER — Ambulatory Visit: Admitting: Pulmonary Disease

## 2024-05-01 ENCOUNTER — Encounter: Payer: Self-pay | Admitting: Pulmonary Disease

## 2024-05-01 VITALS — BP 136/62 | HR 58 | Ht 63.0 in | Wt 115.0 lb

## 2024-05-01 DIAGNOSIS — Z716 Tobacco abuse counseling: Secondary | ICD-10-CM

## 2024-05-01 DIAGNOSIS — F17210 Nicotine dependence, cigarettes, uncomplicated: Secondary | ICD-10-CM | POA: Diagnosis not present

## 2024-05-01 DIAGNOSIS — J42 Unspecified chronic bronchitis: Secondary | ICD-10-CM

## 2024-05-01 MED ORDER — UMECLIDINIUM-VILANTEROL 62.5-25 MCG/ACT IN AEPB: 1.0000 | INHALATION_SPRAY | Freq: Every day | RESPIRATORY_TRACT | 12 refills | Status: AC

## 2024-05-01 NOTE — Patient Instructions (Signed)
 Nice to see you again  No changes to medication  Anoro refilled  Call [phone] and ask for free nicotine patches  Continue CT scans for lung cancer screening, most recent scan looks okay  Return to clinic in 1 year or sooner if needed with Dr. Annella

## 2024-05-01 NOTE — Progress Notes (Signed)
 @Patient  ID: Leslie Lane, female    DOB: 02-22-1956, 68 y.o.   MRN: [id number]  No chief complaint on file.   Referring provider: Rollene Almarie LABOR, *  HPI:   68 y.o. woman whom we are seeing in follow up for evaluation of chronic cough due to chronic bronchitis.  Multiple PCP notes reviewed.  Return for follow-up.  Overdue.  Last seen 07/2021.  Overall has been fine.  No hospitalizations or prednisone for her breathing per her report.  She is out of her Anoro.  She for like her Anoro helped mobilize sputum more.  Breathing was about the same.  She would like to continue the Anoro.  She appreciates some benefit with this use.  We discussed the results of her CT scan chest 2025, lung RADS 2 per report, on my review interpretation emphysematous changes otherwise no concerning findings.  Encouraged 1 year follow-up but she is eager to do.  Lastly, we discussed strategies for smoking cessation.  HPI at initial visit: Patient feels okay.  Denies any dyspnea on exertion.  Does have a cough for about 3 to 4 years.  Daily cough, worse in the morning.  Productive of sputum.  No clear alleviating or exacerbating factors.  No environmental or seasonal changes make things better or worse.  No position that make things better or worse.  She has a history of GERD but symptoms well controlled on PPI.  She denies seasonal allergies, rhinorrhea, nasal congestion.  No recent weight loss.  No chest imaging since 2019.  Chest x-ray 2019 reviewed interpreted as clear lungs, hyperinflation.  Current everyday smoker.  About a pack and a half a day.  Precontemplative in terms of quitting.  Discussed risk and benefits of smoking cessation at length.  Introduced idea of lung cancer screening.  She is interested in further discussion.  PMH: Tobacco abuse, diabetes, Surgical history: C-section, colonoscopy, tubal ligation Family history: Mother with arthritis, father with arthritis, kidney disease, brother with  CAD Social history: Current smoker, approximately 75-pack-year, lives in [place] / Pulmonary Flowsheets:   ACT:      No data to display          MMRC:     No data to display          Epworth:      No data to display          Tests:   FENO:  No results found for: NITRICOXIDE  PFT:     No data to display          WALK:      No data to display          Imaging: Personally reviewed as per EMR discussion this note  Lab Results: Personally reviewed CBC    Component Value Date/Time   WBC 3.7 (L) 12/24/2022 1143   RBC 4.28 12/24/2022 1143   HGB 10.2 (L) 12/24/2022 1143   HCT 33.3 (L) 12/24/2022 1143   PLT 303 12/24/2022 1143   MCV 77.8 (L) 12/24/2022 1143   MCH 23.8 (L) 12/24/2022 1143   MCHC 30.6 12/24/2022 1143   RDW 19.4 (H) 12/24/2022 1143   LYMPHSABS 1.8 12/24/2022 1143   MONOABS 0.3 12/24/2022 1143   EOSABS 0.1 12/24/2022 1143   BASOSABS 0.1 12/24/2022 1143    BMET    Component Value Date/Time   NA 137 06/21/2023 0906   K 4.2 06/21/2023 0906   CL 99 06/21/2023 0906   CO2  30 06/21/2023 0906   GLUCOSE 306 (H) 06/21/2023 0906   BUN 25 (H) 06/21/2023 0906   CREATININE 0.81 06/21/2023 0906   CALCIUM  9.9 06/21/2023 0906   GFRNONAA >60 12/24/2022 1143   GFRAA >60 05/02/2015 2214    BNP No results found for: BNP  ProBNP No results found for: PROBNP  Specialty Problems       Pulmonary Problems   Allergic rhinitis   Qualifier: Diagnosis of  By: Inocencio MD, [place] LABOR       Smokers' cough (HCC)   Annotation: opiate and cocaine abuse Qualifier: Diagnosis of  By: Inocencio MD, Valerie A        Allergies  Allergen Reactions   Penicillins Anaphylaxis   Sulfonamide Derivatives Anaphylaxis    Immunization History  Administered Date(s) Administered   Fluad Quad(high Dose 65+) 04/24/2019, 04/20/2020, 04/28/2021, 04/24/2022   Fluad Trivalent(High Dose 65+) 06/21/2023   Influenza Split  03/22/2011, 03/26/2012   Influenza,inj,Quad PF,6+ Mos 02/28/2013, 04/13/2014, 05/13/2015, 02/28/2016, 03/26/2017, 06/04/2018   PFIZER(Purple Top)SARS-COV-2 Vaccination 10/01/2019, 10/29/2019, 06/21/2020   Pneumococcal Conjugate-13 07/24/2016   Pneumococcal Polysaccharide-23 07/31/2013, 06/17/2019   Td 07/28/2011   Zoster, Live 05/31/2016    Past Medical History:  Diagnosis Date   Abdominal pain 02/28/2016   Abnormal CT scan, bladder    Noted 07/28/13 CT - s/p uro eval 08/2013 [place] -    Acquired trigger finger of left ring finger 02/18/2019   Injected February 18, 2019 and March 2022   Allergic rhinitis    Anemia    Arthritis    Bipolar disorder 07/05/2010   Cataract    Chronic lower back pain 07/05/2010   Chronic neck pain    Diabetic gastroparesis associated with type 1 diabetes mellitus 10/24/2016   Fibromyalgia    Generalized anxiety disorder 04/27/2014   GERD (gastroesophageal reflux disease) 07/05/2010   Hepatitis B virus infection 07/05/2010   History of substance abuse    opiates; cocaine   Hypoglycemia unawareness associated with type 1 diabetes mellitus 11/23/2020   Insomnia 04/13/2015   Major depressive disorder 07/28/2014   Migraine headache 07/05/2010   Mild neurocognitive disorder due to multiple etiologies 03/15/2021   Mixed diabetic hyperlipidemia associated with type 1 diabetes mellitus 07/05/2010   Neuropathy    Protein calorie malnutrition 06/01/2017   Right shoulder pain 02/18/2018   Seizures    pt states, if my blood sugar drops to the 20's, I convulse.  It hasn't happened in a long time.   Smokers' cough 07/05/2010   Tubular adenoma of colon 2018   Type 1 diabetes mellitus with complication 02/06/2013   WBC decreased 03/27/2017    Tobacco History: Social History   Tobacco Use  Smoking Status Every Day   Current packs/day: 1.50   Average packs/day: 1.5 packs/day for 50.0 years (75.0 ttl pk-yrs)   Types: Cigarettes  Smokeless Tobacco Never   Tobacco Comments   Reports she has cut back to a cartoon that now lasts 2 weeks.    Ready to quit: Not Answered Counseling given: Not Answered Tobacco comments: Reports she has cut back to a cartoon that now lasts 2 weeks.     Continue to not smoke  Outpatient Encounter Medications as of 05/01/2024  Medication Sig   Accu-Chek Softclix Lancets lancets Use as instructed to check blood sugar 4 times per day dx code E10.65   Alcohol Swabs (B-D SINGLE USE SWABS REGULAR) PADS USE FOUR TIMES DAILY   aspirin EC 81 MG tablet Take 81 mg by  mouth every morning.   B-D ULTRAFINE III SHORT PEN 31G X 8 MM MISC USE TO INJECT WITH PRE-FILLED PEN SYRINGE SUB-Q DAILY   Blood Glucose Calibration (ACCU-CHEK AVIVA) SOLN USE AS DIRECTED   Blood Glucose Monitoring Suppl (ACCU-CHEK AVIVA PLUS) w/Device KIT USE AS DIRECTED   cetirizine  (ZYRTEC ) 10 MG tablet Take 1 tablet (10 mg total) by mouth daily.   Clobetasol Propionate 0.05 % shampoo Apply topically.   Continuous Glucose Receiver (FREESTYLE LIBRE 3 READER) DEVI 1 Device by Does not apply route continuous.   Continuous Glucose Sensor (FREESTYLE LIBRE 3 PLUS SENSOR) MISC 1 each by Does not apply route continuous. Change every 15 days.   cyclobenzaprine  (FLEXERIL ) 5 MG tablet Take 1 tablet (5 mg total) by mouth 3 (three) times daily as needed for muscle spasms.   dapagliflozin  propanediol (FARXIGA ) 10 MG TABS tablet Take 1 tablet (10 mg total) by mouth daily.   donepezil  (ARICEPT ) 10 MG tablet TAKE 1 TABLET BY MOUTH AT NIGHT   ezetimibe  (ZETIA ) 10 MG tablet Take 1 tablet (10 mg total) by mouth daily.   fexofenadine (ALLEGRA) 180 MG tablet Take 180 mg by mouth daily.   gabapentin  (NEURONTIN ) 300 MG capsule Take 1 capsule (300 mg total) by mouth 3 (three) times daily.   Glucagon  (GVOKE HYPOPEN  2-PACK) 1 MG/0.2ML SOAJ INJECT 1 MG SUBCUTANEOUS AS NEEDED FOR LOW BLOOD SUGAR   glucose blood (ACCU-CHEK AVIVA PLUS) test strip CHECK BLOOD SUGAR 4 TIMES DAILY    hyoscyamine  (LEVBID ) 0.375 MG 12 hr tablet Take 1 tablet (0.375 mg total) by mouth 2 (two) times daily.   insulin  glargine (LANTUS ) 100 UNIT/ML Solostar Pen Take 5 units in the morning and 5 units at bedtime.   insulin  lispro (HUMALOG  KWIKPEN) 100 UNIT/ML KwikPen INJECT SUBCUTANEOUSLY 2-5  UNITS 3 TIMES DAILY AS INSTRUCTED MAXIMUM 20 UNITS A DAY.   Insulin  Pen Needle 32G X 4 MM MISC Use inject insulin  tid   Insulin  Pen Needle 32G X 4 MM MISC Use 4x a day   Insulin  Syringe-Needle U-100 (B-D INSULIN  SYRINGE) 31G X 5/16 0.3 ML MISC Use to administer insulin    lamoTRIgine  (LAMICTAL ) 100 MG tablet Take 1 tablet (100 mg total) by mouth 2 (two) times daily.   Lancets MISC Use to check blood sugar once a day   lipase/protease/amylase (CREON ) 36000 UNITS CPEP capsule Take 2 capsules by mouth three times a day with meals and 1 capsule by mouth twice daily with snacks   Magnesium  500 MG TABS Take 1 tablet by mouth daily.   metoprolol  succinate (TOPROL -XL) 25 MG 24 hr tablet TAKE 1 TABLET(25 MG) BY MOUTH DAILY   Multiple Vitamin (MULTIVITAMIN) tablet Take 1 tablet by mouth daily. Reported on 11/22/2015   nortriptyline  (PAMELOR ) 25 MG capsule Take 1 capsule (25 mg total) by mouth at bedtime.   Omega-3 Fatty Acids (FISH OIL) 1000 MG CAPS Take 1 capsule by mouth daily.   omeprazole  (PRILOSEC) 40 MG capsule TAKE 1 CAPSULE(40 MG) BY MOUTH IN THE MORNING AND AT BEDTIME   rosuvastatin  (CRESTOR ) 40 MG tablet Take 1 tablet (40 mg total) by mouth daily.   traZODone  (DESYREL ) 50 MG tablet Take 1 tablet (50 mg total) by mouth at bedtime.   triamcinolone  cream (KENALOG) 0.1 %    vitamin E 1000 UNIT capsule Take 1,000 Units by mouth daily.   [DISCONTINUED] umeclidinium-vilanterol (ANORO ELLIPTA ) 62.5-25 MCG/ACT AEPB INHALE 1 PUFF INTO THE LUNGS DAILY   umeclidinium-vilanterol (ANORO ELLIPTA ) 62.5-25 MCG/ACT AEPB Inhale 1  puff into the lungs daily.   No facility-administered encounter medications on file as of  05/01/2024.     Review of Systems  Review of Systems  N/a Physical Exam  BP 136/62   Pulse (!) 58   Ht 5' 3 (1.6 m) Comment: per pt  Wt 115 lb (52.2 kg)   SpO2 100%   BMI 20.37 kg/m   Wt Readings from Last 5 Encounters:  05/01/24 115 lb (52.2 kg)  04/21/24 118 lb (53.5 kg)  03/10/24 113 lb (51.3 kg)  01/08/24 113 lb (51.3 kg)  12/31/23 110 lb (49.9 kg)    BMI Readings from Last 5 Encounters:  05/01/24 20.37 kg/m  04/21/24 20.57 kg/m  03/10/24 19.70 kg/m  01/08/24 20.02 kg/m  12/31/23 19.18 kg/m     Physical Exam General: Sitting in chair in no distress Eyes: No icterus Neck: No JVD appreciated Pulmonary: Distant clear normal work of breathing Cardiovascular: Regular rate and rhythm, no murmur appreciated Abdomen: Nondistended MSK: No synovitis, mild joint effusion noted in hands Neuro: No focal deficits, normal gait Psych: Normal mood, full affect   Assessment & Plan:   Chronic bronchitis: Daily cough with mucus present for years.  Related cigarette smoking.  Chest imaging clear.  Mild improvement with Anoro.  Anoro refilled today.  Lung cancer screening: Lung to 03/2024.  Encouraged yearly CT scan via lung cancer screening.  Next due 03/2025.  Smoking assessment and cessation counseling: Patient currently smoking. I have advised the patient to quit/stop smoking as soon as possible due to high risk for multiple medical problems.  It will also be very difficult for us  to manage patient's  respiratory symptoms and status if we continue to expose her lungs to a known irritant.  We do not advise e-cigarettes as a form of stopping smoking.  Send will set patient is willing to quit smoking. I have advised the patient that we can assist and have options of nicotine replacement therapy, provided smoking cessation education today, provided smoking cessation counseling, and provided cessation resources.  We agreed to try nicotine replacement via patches. Follow-up next  office visit office visit for assessment of smoking cessation.  I spent 3 minutes in smoking cessation counseling.   Return in about 1 year (around 05/01/2025) for f/u Dr. Annella.   Donnice JONELLE Annella, MD 05/01/2024

## 2024-05-03 ENCOUNTER — Other Ambulatory Visit: Payer: Self-pay | Admitting: Internal Medicine

## 2024-05-13 ENCOUNTER — Encounter: Payer: Self-pay | Admitting: *Deleted

## 2024-05-13 NOTE — Progress Notes (Signed)
 Leslie Lane                                          MRN: [id number]   05/13/2024   The [facility] Quality Team Specialist reviewed this patient medical record for the purposes of chart review for care gap closure. The following were reviewed: chart review for care gap closure-kidney health evaluation for diabetes:eGFR  and uACR.    [facility] Quality Team

## 2024-05-13 NOTE — Progress Notes (Signed)
 ROSALI AUGELLO                                          MRN: [id number]   05/13/2024   The [facility] Quality Team Specialist reviewed this patient medical record for the purposes of chart review for care gap closure. The following were reviewed: abstraction for care gap closure-glycemic status assessment.    [facility] Quality Team

## 2024-05-26 ENCOUNTER — Other Ambulatory Visit: Payer: Self-pay

## 2024-05-26 DIAGNOSIS — M65331 Trigger finger, right middle finger: Secondary | ICD-10-CM

## 2024-05-27 ENCOUNTER — Other Ambulatory Visit (HOSPITAL_COMMUNITY): Payer: Self-pay | Admitting: Psychiatry

## 2024-05-27 DIAGNOSIS — F4010 Social phobia, unspecified: Secondary | ICD-10-CM

## 2024-05-27 DIAGNOSIS — G4701 Insomnia due to medical condition: Secondary | ICD-10-CM

## 2024-05-27 DIAGNOSIS — F332 Major depressive disorder, recurrent severe without psychotic features: Secondary | ICD-10-CM

## 2024-05-28 ENCOUNTER — Other Ambulatory Visit (HOSPITAL_COMMUNITY): Payer: Self-pay | Admitting: *Deleted

## 2024-05-28 DIAGNOSIS — F332 Major depressive disorder, recurrent severe without psychotic features: Secondary | ICD-10-CM

## 2024-05-28 DIAGNOSIS — G4701 Insomnia due to medical condition: Secondary | ICD-10-CM

## 2024-05-28 MED ORDER — TRAZODONE HCL 50 MG PO TABS: 50.0000 mg | ORAL_TABLET | Freq: Every day | ORAL | 0 refills | Status: DC

## 2024-06-02 ENCOUNTER — Other Ambulatory Visit (HOSPITAL_COMMUNITY): Payer: Self-pay

## 2024-06-02 DIAGNOSIS — F4010 Social phobia, unspecified: Secondary | ICD-10-CM

## 2024-06-02 DIAGNOSIS — F332 Major depressive disorder, recurrent severe without psychotic features: Secondary | ICD-10-CM

## 2024-06-02 MED ORDER — LAMOTRIGINE 100 MG PO TABS: 100.0000 mg | ORAL_TABLET | Freq: Two times a day (BID) | ORAL | 0 refills | Status: DC

## 2024-06-04 ENCOUNTER — Other Ambulatory Visit: Payer: Self-pay | Admitting: Orthopedic Surgery

## 2024-06-04 MED ORDER — ACETAMINOPHEN-CODEINE 300-30 MG PO TABS: 1.0000 | ORAL_TABLET | Freq: Four times a day (QID) | ORAL | 0 refills | Status: AC | PRN

## 2024-06-05 ENCOUNTER — Ambulatory Visit (HOSPITAL_COMMUNITY): Admitting: Psychiatry

## 2024-06-12 ENCOUNTER — Other Ambulatory Visit: Payer: Self-pay

## 2024-06-12 ENCOUNTER — Encounter (HOSPITAL_COMMUNITY): Payer: Self-pay | Admitting: Psychiatry

## 2024-06-12 ENCOUNTER — Ambulatory Visit (HOSPITAL_COMMUNITY): Admitting: Psychiatry

## 2024-06-12 VITALS — BP 161/66 | HR 70 | Ht 63.0 in | Wt 116.0 lb

## 2024-06-12 DIAGNOSIS — F332 Major depressive disorder, recurrent severe without psychotic features: Secondary | ICD-10-CM | POA: Diagnosis not present

## 2024-06-12 DIAGNOSIS — G4701 Insomnia due to medical condition: Secondary | ICD-10-CM | POA: Diagnosis not present

## 2024-06-12 DIAGNOSIS — F4010 Social phobia, unspecified: Secondary | ICD-10-CM

## 2024-06-12 MED ORDER — LAMOTRIGINE 100 MG PO TABS: 100.0000 mg | ORAL_TABLET | Freq: Two times a day (BID) | ORAL | 2 refills | Status: AC

## 2024-06-12 MED ORDER — TRAZODONE HCL 150 MG PO TABS: 150.0000 mg | ORAL_TABLET | Freq: Every day | ORAL | 2 refills | Status: AC

## 2024-06-12 NOTE — Progress Notes (Signed)
 [facility] MD/PA/NP OP Progress Note  Patient location; office Provider location; office  06/12/2024 2:52 PM [address]  MRN:  [id number]  Chief Complaint:  Chief Complaint  Patient presents with   Follow-up   Anxiety   HPI: Patient came to the office for her follow-up appointment.  On the last visit we tried amitriptyline but she did not like it.  We have recommended to cut down the trazodone  50 mg from 150 mg but she could not sleep well.  She decided to stop the amitriptyline and went back to trazodone  150 mg at bedtime.  She excited as daughter is having a twin birth on 19th.  Patient told daughter has another 69-year-old daughter from her husband.  She still has chronic anxiety and social anxiety and sometimes has no energy but she does not want to change the medication.  Patient lives with her mother.  She does not want to change the medication as she feels the Lamictal  and trazodone  combination is working okay.  She also reported her blood sugar fluctuates but she is trying to keep her sugar under control.  She denies any suicidal thoughts or homicidal thoughts.  She denies any agitation, anger, mania.  Her headaches are not as bad.  She remained restless and fidgety during the session.  She has no rash or any itching.  She denies any suicidal thoughts.  Visit Diagnosis:    ICD-10-CM   1. Major depressive disorder, recurrent, severe without psychotic features (HCC)  F33.2 lamoTRIgine  (LAMICTAL ) 100 MG tablet    traZODone  (DESYREL ) 150 MG tablet    2. Social anxiety disorder  F40.10 lamoTRIgine  (LAMICTAL ) 100 MG tablet    3. Insomnia due to medical condition  G47.01 traZODone  (DESYREL ) 150 MG tablet        Past Psychiatric History: Reviewed H/O cutting arm at young age.  H/O overdose on diabetes medication at age 89.  H/O drinking and saw Mental health provider. Took Zoloft , Wellbutrin , BuSpar , Paxil , Cymbalta  and Atrax caused tiredness. Lexapro  and Trintellix  caused headache. No  history of psychosis.  H/O social anxiety.  Saw Dr. Brutus in the past.  Past Medical History:  Past Medical History:  Diagnosis Date   Abdominal pain 02/28/2016   Abnormal CT scan, bladder    Noted 07/28/13 CT - s/p uro eval 08/2013 [place] -    Acquired trigger finger of left ring finger 02/18/2019   Injected February 18, 2019 and March 2022   Allergic rhinitis    Anemia    Arthritis    Bipolar disorder 07/05/2010   Cataract    Chronic lower back pain 07/05/2010   Chronic neck pain    Diabetic gastroparesis associated with type 1 diabetes mellitus 10/24/2016   Fibromyalgia    Generalized anxiety disorder 04/27/2014   GERD (gastroesophageal reflux disease) 07/05/2010   Hepatitis B virus infection 07/05/2010   History of substance abuse    opiates; cocaine   Hypoglycemia unawareness associated with type 1 diabetes mellitus 11/23/2020   Insomnia 04/13/2015   Major depressive disorder 07/28/2014   Migraine headache 07/05/2010   Mild neurocognitive disorder due to multiple etiologies 03/15/2021   Mixed diabetic hyperlipidemia associated with type 1 diabetes mellitus 07/05/2010   Neuropathy    Protein calorie malnutrition 06/01/2017   Right shoulder pain 02/18/2018   Seizures    pt states, if my blood sugar drops to the 20's, I convulse.  It hasn't happened in a long time.   Smokers' cough 07/05/2010   Tubular  adenoma of colon 2018   Type 1 diabetes mellitus with complication 02/06/2013   WBC decreased 03/27/2017    Past Surgical History:  Procedure Laterality Date   CESAREAN SECTION     COLONOSCOPY     TUBAL LIGATION     UPPER GASTROINTESTINAL ENDOSCOPY      Family Psychiatric History: Reviewed  Family History:  Family History  Problem Relation Age of Onset   Arthritis Mother    Diverticulosis Mother    Kidney disease Mother    Hyperlipidemia Mother    Arthritis Father    Kidney disease Father    Kidney cancer Father    Bladder Cancer Sister    Colon cancer  Sister 58   Celiac disease Sister    Heart attack Brother    Kidney cancer Paternal Aunt    Diabetes Niece    Hyperlipidemia Other    Anxiety disorder Neg Hx    Bipolar disorder Neg Hx    Depression Neg Hx    Breast cancer Neg Hx    Esophageal cancer Neg Hx    Stomach cancer Neg Hx    Rectal cancer Neg Hx     Social History:  Social History   Socioeconomic History   Marital status: Divorced    Spouse name: Not on file   Number of children: 1   Years of education: 12   Highest education level: High school graduate  Occupational History   Occupation: Disability  Tobacco Use   Smoking status: Every Day    Current packs/day: 1.50    Average packs/day: 1.5 packs/day for 50.0 years (75.0 ttl pk-yrs)    Types: Cigarettes   Smokeless tobacco: Never   Tobacco comments:    Reports she has cut back to a cartoon that now lasts 2 weeks.   Vaping Use   Vaping status: Former  Substance and Sexual Activity   Alcohol use: Yes    Alcohol/week: 7.0 standard drinks of alcohol    Types: 7 Cans of beer per week    Comment: 1 beer nightly   Drug use: Not Currently    Types: Cocaine    Comment: past opiate and cocaine abuse   Sexual activity: Not Currently    Birth control/protection: Surgical, Post-menopausal  Other Topics Concern   Not on file  Social History Narrative   Divorced, disabled   Lives with parents   Right handed    Retired   Caffeine prn   Social Drivers of Health   Tobacco Use: High Risk (05/01/2024)   Patient History    Smoking Tobacco Use: Every Day    Smokeless Tobacco Use: Never    Passive Exposure: Not on file  Financial Resource Strain: Low Risk (03/14/2022)   Overall Financial Resource Strain (CARDIA)    Difficulty of Paying Living Expenses: Not hard at all  Food Insecurity: No Food Insecurity (05/23/2023)   Hunger Vital Sign    Worried About Running Out of Food in the Last Year: Never true    Ran Out of Food in the Last Year: Never true   Transportation Needs: No Transportation Needs (05/23/2023)   PRAPARE - Administrator, Civil Service (Medical): No    Lack of Transportation (Non-Medical): No  Physical Activity: Sufficiently Active (03/14/2022)   Exercise Vital Sign    Days of Exercise per Week: 5 days    Minutes of Exercise per Session: 30 min  Stress: Stress Concern Present (03/14/2022)   [employer] of Occupational Health -  Occupational Stress Questionnaire    Feeling of Stress : To some extent  Social Connections: Socially Integrated (03/14/2022)   Social Connection and Isolation Panel    Frequency of Communication with Friends and Family: More than three times a week    Frequency of Social Gatherings with Friends and Family: More than three times a week    Attends Religious Services: More than 4 times per year    Active Member of Clubs or Organizations: Yes    Attends Banker Meetings: More than 4 times per year    Marital Status: Married  Depression (PHQ2-9): High Risk (12/31/2023)   Depression (PHQ2-9)    PHQ-2 Score: 15  Alcohol Screen: Low Risk (03/14/2022)   Alcohol Screen    Last Alcohol Screening Score (AUDIT): 4  Housing: Low Risk (05/23/2023)   Housing    Last Housing Risk Score: 0  Utilities: Not At Risk (05/23/2023)   AHC Utilities    Threatened with loss of utilities: No  Health Literacy: Not on file    Allergies:  Allergies  Allergen Reactions   Penicillins Anaphylaxis   Sulfonamide Derivatives Anaphylaxis    Metabolic Disorder Labs: Lab Results  Component Value Date   HGBA1C 6.1 (A) 04/21/2024   No results found for: PROLACTIN Lab Results  Component Value Date   CHOL 218 (H) 06/22/2023   TRIG 67 06/22/2023   HDL 110 06/22/2023   CHOLHDL 2.0 06/22/2023   VLDL 86.5 11/10/2022   LDLCALC 96 06/22/2023   LDLCALC 73 11/10/2022   Lab Results  Component Value Date   TSH 0.71 06/07/2021   TSH 1.78 06/13/2018    Therapeutic Level Labs: No results  found for: LITHIUM No results found for: VALPROATE No results found for: CBMZ  Current Medications: Current Outpatient Medications  Medication Sig Dispense Refill   Accu-Chek Softclix Lancets lancets Use as instructed to check blood sugar 4 times per day dx code E10.65 400 each 1   acetaminophen -codeine  (TYLENOL  #3) 300-30 MG tablet Take 1 tablet by mouth every 6 (six) hours as needed. 20 tablet 0   Alcohol Swabs (B-D SINGLE USE SWABS REGULAR) PADS USE FOUR TIMES DAILY 400 each 2   aspirin EC 81 MG tablet Take 81 mg by mouth every morning.     B-D ULTRAFINE III SHORT PEN 31G X 8 MM MISC USE TO INJECT WITH PRE-FILLED PEN SYRINGE SUB-Q DAILY 400 each 2   Blood Glucose Calibration (ACCU-CHEK AVIVA) SOLN USE AS DIRECTED 1 each 1   Blood Glucose Monitoring Suppl (ACCU-CHEK AVIVA PLUS) w/Device KIT USE AS DIRECTED 1 kit 0   cetirizine  (ZYRTEC ) 10 MG tablet Take 1 tablet (10 mg total) by mouth daily. 90 tablet 3   Clobetasol Propionate 0.05 % shampoo Apply topically.     Continuous Glucose Receiver (FREESTYLE LIBRE 3 READER) DEVI 1 Device by Does not apply route continuous. 1 each 0   Continuous Glucose Sensor (FREESTYLE LIBRE 3 PLUS SENSOR) MISC 1 each by Does not apply route continuous. Change every 15 days. 6 each 3   cyclobenzaprine  (FLEXERIL ) 5 MG tablet Take 1 tablet (5 mg total) by mouth 3 (three) times daily as needed for muscle spasms. 30 tablet 1   dapagliflozin  propanediol (FARXIGA ) 10 MG TABS tablet Take 1 tablet (10 mg total) by mouth daily. 30 tablet 11   donepezil  (ARICEPT ) 10 MG tablet TAKE 1 TABLET BY MOUTH AT NIGHT 60 tablet 5   ezetimibe  (ZETIA ) 10 MG tablet Take 1 tablet (10 mg  total) by mouth daily. 90 tablet 3   fexofenadine (ALLEGRA) 180 MG tablet Take 180 mg by mouth daily.     gabapentin  (NEURONTIN ) 300 MG capsule Take 1 capsule (300 mg total) by mouth 3 (three) times daily. 90 capsule 5   Glucagon  (GVOKE HYPOPEN  2-PACK) 1 MG/0.2ML SOAJ INJECT 1 MG SUBCUTANEOUS AS  NEEDED FOR LOW BLOOD SUGAR 0.4 mL 2   glucose blood (ACCU-CHEK AVIVA PLUS) test strip CHECK BLOOD SUGAR 4 TIMES DAILY 400 strip 3   hyoscyamine  (LEVBID ) 0.375 MG 12 hr tablet Take 1 tablet (0.375 mg total) by mouth 2 (two) times daily. 60 tablet 11   insulin  glargine (LANTUS ) 100 UNIT/ML Solostar Pen Take 5 units in the morning and 5 units at bedtime. 15 mL 4   insulin  lispro (HUMALOG  KWIKPEN) 100 UNIT/ML KwikPen INJECT SUBCUTANEOUSLY 2-5  UNITS 3 TIMES DAILY AS INSTRUCTED MAXIMUM 20 UNITS A DAY. 30 mL 4   Insulin  Pen Needle 32G X 4 MM MISC Use inject insulin  tid 300 each 2   Insulin  Pen Needle 32G X 4 MM MISC Use 4x a day 300 each 3   Insulin  Syringe-Needle U-100 (B-D INSULIN  SYRINGE) 31G X 5/16 0.3 ML MISC Use to administer insulin  10 each 2   lamoTRIgine  (LAMICTAL ) 100 MG tablet Take 1 tablet (100 mg total) by mouth 2 (two) times daily. 14 tablet 0   Lancets MISC Use to check blood sugar once a day 100 each 3   lipase/protease/amylase (CREON ) 36000 UNITS CPEP capsule Take 2 capsules by mouth three times a day with meals and 1 capsule by mouth twice daily with snacks 240 capsule 11   Magnesium  500 MG TABS Take 1 tablet by mouth daily.     metoprolol  succinate (TOPROL -XL) 25 MG 24 hr tablet TAKE 1 TABLET(25 MG) BY MOUTH DAILY 90 tablet 3   Multiple Vitamin (MULTIVITAMIN) tablet Take 1 tablet by mouth daily. Reported on 11/22/2015     nortriptyline  (PAMELOR ) 25 MG capsule Take 1 capsule (25 mg total) by mouth at bedtime. 30 capsule 1   Omega-3 Fatty Acids (FISH OIL) 1000 MG CAPS Take 1 capsule by mouth daily.     omeprazole  (PRILOSEC) 40 MG capsule TAKE 1 CAPSULE(40 MG) BY MOUTH IN THE MORNING AND AT BEDTIME 180 capsule 0   rosuvastatin  (CRESTOR ) 40 MG tablet Take 1 tablet (40 mg total) by mouth daily. 90 tablet 3   traZODone  (DESYREL ) 50 MG tablet Take 1 tablet (50 mg total) by mouth at bedtime. 10 tablet 0   triamcinolone  cream (KENALOG) 0.1 %      umeclidinium-vilanterol (ANORO ELLIPTA )  62.5-25 MCG/ACT AEPB Inhale 1 puff into the lungs daily. 60 each 12   vitamin E 1000 UNIT capsule Take 1,000 Units by mouth daily.     No current facility-administered medications for this visit.     Musculoskeletal: Strength & Muscle Tone: within normal limits Gait & Station: normal Patient leans: N/A  Psychiatric Specialty Exam: Review of Systems  Psychiatric/Behavioral:  Positive for sleep disturbance. The patient is nervous/anxious.     Blood pressure (!) 161/66, pulse 70, height 5' 3 (1.6 m), weight 116 lb (52.6 kg).Body mass index is 20.55 kg/m.  General Appearance: Casual  Eye Contact:  Fair  Speech:  Normal Rate  Volume:  Decreased  Mood:  Anxious  Affect:  Labile  Thought Process:  Descriptions of Associations: Intact  Orientation:  Full (Time, Place, and Person)  Thought Content: WDL   Suicidal Thoughts:  No  Homicidal Thoughts:  No  Memory:  Immediate;   Good Recent;   Fair Remote;   Fair  Judgement:  Intact  Insight:  Present  Psychomotor Activity:  Restlessness  Concentration:  Concentration: Fair and Attention Span: Fair  Recall:  [employer] of Knowledge: Fair  Language: Good  Akathisia:  No  Handed:  Right  AIMS (if indicated): not done  Assets:  Communication Skills Desire for Improvement Housing Social Support Transportation  ADL's:  Intact  Cognition: WNL  Sleep:  better with higher dose of Trazodone .    Screenings: AUDIT    Flowsheet Row Clinical Support from 03/14/2022 in [facility]  Alcohol Use Disorder Identification Test Final Score (AUDIT) 4   GAD-7    Flowsheet Row Office Visit from 12/31/2023 in [facility] [place] Office Visit from 08/16/2023 in [facility] Office Visit from 12/16/2020 in [facility] at [facility] Office Visit from 06/17/2019 in [facility] Office Visit from 11/27/2017 in  [facility]  Total GAD-7 Score 18 9 17 19 18    Mini-Mental    Flowsheet Row Office Visit from 01/08/2024 in [facility] Office Visit from 08/30/2023 in [facility] Office Visit from 02/27/2023 in [facility] Office Visit from 08/23/2022 in [facility] Office Visit from 12/03/2020 in [facility]  Total Score (max 30 points ) 30 30 28 30 22    PHQ2-9    Flowsheet Row Office Visit from 12/31/2023 in [facility] Office Visit from 08/16/2023 [facility] Visit from 06/21/2023 in [facility] [place] [facility] Visit from 12/05/2022 in [facility] [place] [facility]  PHQ-2 Total Score 3 2 0 0 3  PHQ-9 Total Score 15 8 0 -- 19   Flowsheet Row ED from 12/24/2022 in [facility] Department at [facility] Office Visit from 10[facility]05[facility]2022 [facility] ED from 06/14/2022 in [facility] Department at [facility]  C-SSRS RISK CATEGORY No Risk Error: Q3, 4, or 5 should not be populated when Q2 is No No Risk     Assessment and Plan: Patient is 68 year old female with history of headaches, coronary artery disease, mitral valve prolapse, GERD, diabetes mellitus, social anxiety disorder and major depressive disorder. Discontinue amitriptyline as patient did not like and like to go back to previous dose of trazodone  which was 150 mg.  She is also not interested in therapy.  So far no major concern or side effects.  She has no tremors.  Will keep the current dose of trazodone  150 mg at bedtime and Lamictal  100 mg twice a day.  Patient is not interested in therapy.  Recommend to call back if she is any question or any concern.  Follow-up in 3  months.  Collaboration of Care: Collaboration of Care: Other provider involved in patient's care AEB notes are available in epic to review  Patient/Guardian was advised Release of Information must be obtained prior to any record release in order to collaborate their care with an outside provider. Patient/Guardian was advised if they have not already done so to contact the registration department to sign all necessary forms in order for us  to release information regarding their care.  Consent: Patient/Guardian gives verbal consent for treatment and assignment of benefits for services provided during this visit. Patient/Guardian expressed understanding and agreed to proceed.    Leni ONEIDA Client, MD 06/12/2024, 2:52 PM

## 2024-06-17 NOTE — Therapy (Signed)
 OUTPATIENT OCCUPATIONAL THERAPY ORTHO EVALUATION AND TREATMENT NOTE  Patient Name: Leslie Lane MRN: [id number] DOB:1955/10/14, 68 y.o., female Today's Date: 06/18/2024  REFERRING PROVIDER: Arlinda Buster, MD   END OF SESSION:   Past Medical History:  Diagnosis Date   Abdominal pain 02/28/2016   Abnormal CT scan, bladder    Noted 07/28/13 CT - s/p uro eval 08/2013 [place] -    Acquired trigger finger of left ring finger 02/18/2019   Injected February 18, 2019 and March 2022   Allergic rhinitis    Anemia    Arthritis    Bipolar disorder 07/05/2010   Cataract    Chronic lower back pain 07/05/2010   Chronic neck pain    Diabetic gastroparesis associated with type 1 diabetes mellitus 10/24/2016   Fibromyalgia    Generalized anxiety disorder 04/27/2014   GERD (gastroesophageal reflux disease) 07/05/2010   Hepatitis B virus infection 07/05/2010   History of substance abuse    opiates; cocaine   Hypoglycemia unawareness associated with type 1 diabetes mellitus 11/23/2020   Insomnia 04/13/2015   Major depressive disorder 07/28/2014   Migraine headache 07/05/2010   Mild neurocognitive disorder due to multiple etiologies 03/15/2021   Mixed diabetic hyperlipidemia associated with type 1 diabetes mellitus 07/05/2010   Neuropathy    Protein calorie malnutrition 06/01/2017   Right shoulder pain 02/18/2018   Seizures    pt states, if my blood sugar drops to the 20's, I convulse.  It hasn't happened in a long time.   Smokers' cough 07/05/2010   Tubular adenoma of colon 2018   Type 1 diabetes mellitus with complication 02/06/2013   WBC decreased 03/27/2017   Past Surgical History:  Procedure Laterality Date   CESAREAN SECTION     COLONOSCOPY     TUBAL LIGATION     UPPER GASTROINTESTINAL ENDOSCOPY     Patient Active Problem List   Diagnosis Date Noted   Coronary artery calcification 12/20/2023   Nonrheumatic mitral (valve) stenosis 12/20/2023   Dysuria 06/21/2023    Atypical chest pain 12/06/2022   Cardiac calcification (HCC) 12/06/2022   Left hip pain 12/23/2021   Pancreatic insufficiency 04/29/2021   Aortic atherosclerosis 04/29/2021   History of substance abuse 03/15/2021   Mild neurocognitive disorder due to multiple etiologies 03/15/2021   Fibromyalgia    Hypoglycemia unawareness associated with type 1 diabetes mellitus 11/23/2020   Acquired trigger finger of left ring finger 02/18/2019   Right shoulder pain 02/18/2018   Protein calorie malnutrition 06/01/2017   Encounter for general adult medical examination with abnormal findings 06/01/2017   WBC decreased 03/27/2017   Diabetic gastroparesis associated with type 1 diabetes mellitus 10/24/2016   Abdominal pain 02/28/2016   Insomnia 04/13/2015   Major depressive disorder 07/28/2014   Nicotine dependence 07/28/2014   Generalized anxiety disorder 04/27/2014   Abnormal CT scan, bladder    Type 1 diabetes mellitus with complication 02/06/2013   Hepatitis B virus infection 07/05/2010   Mixed diabetic hyperlipidemia associated with type 1 diabetes mellitus (HCC) 07/05/2010   Bipolar disorder 07/05/2010   Smokers' cough (HCC) 07/05/2010   Migraine headache 07/05/2010   Allergic rhinitis 07/05/2010   GERD (gastroesophageal reflux disease) 07/05/2010   Chronic lower back pain 07/05/2010     ONSET DATE:  DOS 06/05/24  REFERRING DIAG: M65.331 (ICD-10-CM) - Trigger finger, right middle finger   THERAPY DIAG:     Pain in right hand - Plan: Ot plan of care cert/re-cert  Localized edema - Plan: Ot plan of care  cert/re-cert  Muscle weakness (generalized) - Plan: Ot plan of care cert/re-cert  Stiffness of right hand, not elsewhere classified - Plan: Ot plan of care cert/re-cert  Other lack of coordination - Plan: Ot plan of care cert/re-cert  Rationale for Evaluation and Treatment: Rehabilitation  SUBJECTIVE:   SUBJECTIVE STATEMENT: The patient states hx of triggering and pain in their  hand and subsequent surgical release ~ 2 weeks ago. The patient states having some stiffness, pain, decreased ability to make a fist and perform I/ADLs.  She also states a history of arthritis bilaterally, other chronic pains and problems.  She states she lives with her 60 year old mother, and she has been helping her daughter who is pregnant.  She states that she recently went to her daughter's house to help her clean and was using her hand a lot which made it hurt.  She did show up for her postop appointment with the surgeon today, and the surgeon calls in for antibiotics because she does show some signs of infection.   PERTINENT HISTORY: The patient is now approx 2 weeks s/p Rt hand 3rd finger TFR.   PRECAUTIONS: None relative to this evaluation and episode of care.   RED FLAGS: None   WEIGHT BEARING RESTRICTIONS: Yes: caution with weightbearing for the next 4-6 weeks, recommended less than 5lbs for next 2 weeks with affected hand  PAIN:  Are you having pain? Yes: NPRS scale: between mild and moderate now at rest  Pain location:  sx area Pain description: aching and sore Aggravating factors: gripping/squeezing Relieving factors: rest  FALLS: Has patient fallen in last 6 months? No, not a fall risk  PLOF: Independent with I/ADLs  PATIENT GOALS: To improve motion, function with affected surgical hand  NEXT MD VISIT: PRN    OBJECTIVE MEASURES:   ADLs: Overall ADLs: States decreased ability to grab, hold household objects, pain and difficulty to open containers, perform FMS tasks (manipulate fasteners on clothing).    QuickDASH score: 24% impairment today  UPPER EXTREMITY ROM:     A/ROM Right eval  Wrist flexion 65  Wrist extension 60  (Blank rows = not tested)                    Hand A/ROM Right eval  Full Fist Ability (or Gap to Distal Palmar Crease) Approximately 1.5 cm gap from tips of fingers to distal palmar crease at the start of the session.  This did improve  after treatment and exercises were initiated.  She has mild extension lag in the middle finger which does improve after extension stretches were educated on in the session.  Thumb Opposition  (Kapandji Scale)  8/10  (Blank rows = not tested)   HAND STRENGTH & FUNCTION: Eval: Observed weakness in affected hand/arm, grossly 3-/5 MMT, but specific gripping and resistance training contraindicated today. Also at least mild observed coordination impairments with affected hand/arm due to stiffness and soreness. These deficits are expected to improve with HEP and recommendations.    COORDINATION: Eval: Mild observed coordination impairments with surgical hand, as seen by pain,stiffness, etc. Expected to improve with HEP and recommendations.   SENSATION: Eval:  Light touch mildly diminished especially through sx area. Expected to improve with HEP and recommendations.   EDEMA:   Eval:  Mildly swollen in surgical hand today.  Expected to improve with HEP and recommendations.   COGNITION: Eval: Overall cognitive status: WFL for evaluation today   OBSERVATIONS:   Eval: She has no dehiscence, but  she is swollen, tender, red and has a milky white exudate coming from the wound.  These things are atypical for this time postop and do represent possible infection at the surgical site.  She does state that the surgeon called in an 8-day course of antibiotics for her.   TODAY'S TREATMENT:  Post-evaluation treatment:   The patient was given safety information for managing post-op wound, including not to soak wound, to keep clean and dry, to start with gentle scar light touch. The patient should replace their wound dressing at least 2x daily, and monitor for signs of infection improving or getting worse.  The patient was supplied with compressive gauze to help with swelling as needed.  If her infection is not looking better in approximately 8 days after course of antibiotics or over, she should contact the  surgeon immediately.  OT does help her with wound care today, including applying a thin layer of bacitracin to the wound, covering with gauze and Tegaderm, also wearing a compressive sleeve over the wound to help with swelling.  The patient should also avoid any strong gripping, push, pull, weight bearing or repetitive motion for the next month.  The patient  should not be doing painful activities.   After a month, the patient can progressively return to all light, normal activities. Sports and heavy weight lifting should be withheld for a total of 3 months.   The patient was also educated (explanation and demonstration) on the following home exercise program including tolerable range of motion, gentle passive range of motion, scar care, progressive desensitization, prevention of soft tissue contractures, etc. The patient states understanding all directions and feels comfortable with doing this at home, though she would like to have an open plan of care and possibly return in several weeks if needed for any complications or pain or problems.  OT is in agreement with this, and she will also be following up with the surgeon in approximately 2 weeks.     Trigger Finger Release Exercises    Exercises - Bend and Pull Back Wrist SLOWLY  - 4-6 x daily - 10-15 reps - Tendon Glides  - 3-4 x daily - 5 reps - 3 second hold - Wrist Prayer Stretch  - 3-4 x daily - 3 reps - 15 seconds hold - Full finger stretches   - 3-4 x daily - 3 reps - 15 second hold - Push your knuckles down, pull back hand to feel a stretch  - 3-4 x daily - 3 reps - 15 second hold Patient Education - Scar Massage   PATIENT EDUCATION: Education details: See tx section above for details  Person educated: Patient Education method: Verbal Instruction, Teach back, Handouts  Education comprehension: States and demonstrates understanding   HOME EXERCISE PROGRAM: Access Code: [email or web address] URL: [email or web address] Date:  06/18/2024 Prepared by: Melvenia Ada    GOALS: Goals reviewed with patient? Yes   SHORT TERM GOALS: (STG required if POC>30 days) Target Date: 06/18/24  1.  Pt will demo/state understanding of initial HEP and therapist recommendations to improve pain levels, improve motions and ability and eventually return to normal activities.   Goal status: 06/18/2024: Initial    ASSESSMENT:  CLINICAL IMPRESSION: Patient is a 68 y.o. female who was seen today for occupational therapy evaluation for swelling, pain, weakness and decreased functional ability following trigger finger release procedure. The patient is appropriate for OT rehab services and benefited from treatment today. The patient got copious education/treatment today for self-care, wound management,  exercises and how to transition to normal activities in the next 4-6 weeks. The patient agrees that they can manage these recommendations independently, however she is nervous that she is a slow healer, so OT will leave this plan of care open for a month if she should need any follow-up.  The patient should also follow up with the surgeon with any concerns.   PERFORMANCE DEFICITS: in functional skills including ADLs, IADLs, coordination, dexterity, sensation, edema, ROM, strength, pain, fascial restrictions, flexibility, Fine motor control, body mechanics, endurance, decreased knowledge of precautions, wound, and UE functional use, cognitive skills including problem solving and safety awareness, and psychosocial skills including coping strategies, environmental adaptation, and habits.   IMPAIRMENTS: are limiting patient from ADLs, IADLs, rest and sleep, leisure, and social participation.   COMORBIDITIES: may have co-morbidities  that affects occupational performance. Patient will benefit from skilled OT to address above impairments and improve overall function.  MODIFICATION OR ASSISTANCE TO COMPLETE EVALUATION: No modification of tasks  or assist necessary to complete an evaluation.  OT OCCUPATIONAL PROFILE AND HISTORY: Problem focused assessment: Including review of records relating to presenting problem.  CLINICAL DECISION MAKING: LOW - limited treatment options, no task modification necessary  REHAB POTENTIAL: Excellent  EVALUATION COMPLEXITY: Low      PLAN:  OT FREQUENCY & OT DURATION: She can follow-up up to once a week for 4 weeks through 07/18/2024 and up to 5 total visits as needed  PLANNED INTERVENTIONS: self care/ADL training, therapeutic exercise, therapeutic activity, neuromuscular re-education, manual therapy, scar mobilization, passive range of motion, splinting, ultrasound, fluidotherapy, compression bandaging, moist heat, cryotherapy, contrast bath, patient/family education, energy conservation, coping strategies training, and Re-evaluation  RECOMMENDED OTHER SERVICES: none now   CONSULTED AND AGREED WITH PLAN OF CARE: Patient  PLAN FOR NEXT SESSION:   N/A    Melvenia Ada, OTR/L, CHT 06/18/2024, 1:46 PM    Date of referral: 05/26/2024  Referring provider: Arlinda Buster, MD  Referring diagnosis? M65.331 (ICD-10-CM) - Trigger finger, right middle finger  Treatment diagnosis? (if different than referring diagnosis) M25.641 ; M79.641   What was this (referring dx) caused by? Surgery (Type: Right hand middle finger trigger finger release)  Nature of Condition: Initial Onset (within last 3 months)   Laterality: Rt  Current Functional Measure Score: DASH 24%  Objective measurements identify impairments when they are compared to normal values, the uninvolved extremity, and prior level of function.  [x]  Yes  []  No  Objective assessment of functional ability: Minimal functional limitations   Briefly describe symptoms: Soreness, stiffness, weakness, decreased coordination and functional ability in the right hand  How did symptoms start: Chronic pain and triggering of the right hand  middle finger leading to surgical release  Average pain intensity:  Last 24 hours: 2-3/10  Past week: 3-4/10  How often does the pt experience symptoms? Occasionally  How much have the symptoms interfered with usual daily activities? A little bit  How has condition changed since care began at this facility? NA - initial visit  In general, how is the patients overall health? Good   BACK PAIN (STarT Back Screening Tool) No

## 2024-06-18 ENCOUNTER — Ambulatory Visit: Admitting: Orthopedic Surgery

## 2024-06-18 ENCOUNTER — Encounter: Payer: Self-pay | Admitting: Rehabilitative and Restorative Service Providers"

## 2024-06-18 ENCOUNTER — Ambulatory Visit: Admitting: Rehabilitative and Restorative Service Providers"

## 2024-06-18 DIAGNOSIS — R278 Other lack of coordination: Secondary | ICD-10-CM | POA: Diagnosis not present

## 2024-06-18 DIAGNOSIS — M79641 Pain in right hand: Secondary | ICD-10-CM | POA: Diagnosis not present

## 2024-06-18 DIAGNOSIS — R600 Localized edema: Secondary | ICD-10-CM

## 2024-06-18 DIAGNOSIS — M6281 Muscle weakness (generalized): Secondary | ICD-10-CM | POA: Diagnosis not present

## 2024-06-18 DIAGNOSIS — M25641 Stiffness of right hand, not elsewhere classified: Secondary | ICD-10-CM

## 2024-06-18 DIAGNOSIS — Z9889 Other specified postprocedural states: Secondary | ICD-10-CM

## 2024-06-18 MED ORDER — DOXYCYCLINE HYCLATE 100 MG PO TABS: 100.0000 mg | ORAL_TABLET | Freq: Two times a day (BID) | ORAL | 0 refills | Status: AC

## 2024-06-18 NOTE — Progress Notes (Unsigned)
° °  Leslie Lane - 68 y.o. female MRN [id number]  Date of birth: Mar 22, 1956  Office Visit Note: Visit Date: 06/18/2024 PCP: Rollene Almarie LABOR, MD Referred by: Rollene Almarie LABOR, MD  Subjective:  HPI: Leslie Lane is a 68 y.o. female who presents today for follow up 2 weeks status post right long finger trigger digit release.  Pertinent ROS were reviewed with the patient and found to be negative unless otherwise specified above in HPI.   Assessment & Plan: Visit Diagnoses: No diagnosis found.  Plan: ***  Follow-up: No follow-ups on file.   Meds & Orders: No orders of the defined types were placed in this encounter.  No orders of the defined types were placed in this encounter.    Procedures: No procedures performed       Objective:   Vital Signs: There were no vitals taken for this visit.  Ortho Exam ***  Imaging: No results found.   Blayke Pinera Afton Alderton, M.D. [facility], Hand Surgery

## 2024-06-18 NOTE — Patient Instructions (Signed)
  Nate's Trigger Finger Release Recommendations   Do not to soak your wound for at least 1 week, or until it looks well healed. Wash hand or shower quickly, then dab dry gently. You can put a Vaseline or plain lotion on your closed wound to help the scar heal smoothly. (Don't leave it looking dry and crusty.)   Avoid any strong gripping, push, pull, weight bearing or repetitive motion for the next month.  (If it hurts very badly- don't do it!) After a month, you can progressively return to all normal, light activities. Sports and heavy weightlifting should be withheld for about 3 months.   Gently rub/touch the surface of your scar 4-6x day to desensitize it or "normalize" your sensation. Once your surgical wound is looking healed and closed (about 3 days after stitches are out) start touching/rubbing your scar trying to shift the top layer of your skin. It's ok to do this gently over top of steri-strips. Do not remove steri-strips, but wait for them to fall off naturally. Rub, touch gently for 2-3 minutes, 3-4 x day.      If you have any tingling or itching/burning around your scar, just lightly rub/brush it with a cloth for 2-3 mins to calm your nerves.  Begin with gentle motion exercises about 4 times a day, non-painfully, feeling light to medium tension.  (See exercise sheet)  Wait 3-4 weeks after surgery to use "squeeze balls" or "hand grippers"  Wait 7-8 weeks after surgery to start strengthening in your arm in a gym setting   In general, keep your hand moving, and don't let it heal stiffly.  If you're sore- slow down!   If you're stiff- speed up!     If you are having any new problems, lingering stiffness, pain, signs of infection (redness, swelling, oozing puss, tenderness), contact your surgeon immediately.  He may send you to formal hand therapy.

## 2024-06-30 ENCOUNTER — Ambulatory Visit (INDEPENDENT_AMBULATORY_CARE_PROVIDER_SITE_OTHER): Admitting: Orthopedic Surgery

## 2024-06-30 ENCOUNTER — Other Ambulatory Visit: Payer: Self-pay | Admitting: Cardiovascular Disease

## 2024-06-30 DIAGNOSIS — Z9889 Other specified postprocedural states: Secondary | ICD-10-CM

## 2024-06-30 NOTE — Progress Notes (Signed)
" ° °  Leslie Lane - 68 y.o. female MRN [id number]  Date of birth: 03-04-1956  Office Visit Note: Visit Date: 06/30/2024 PCP: Rollene Almarie LABOR, MD Referred by: Rollene Almarie LABOR, MD  Subjective:  HPI: Leslie Lane is a 68 y.o. female who presents today for follow up 3 weeks status post right long finger trigger digit release. Here for a wound check today. She is overall doing a lot better, still has some swelling which is normal.  Pertinent ROS were reviewed with the patient and found to be negative unless otherwise specified above in HPI.   Assessment & Plan: Visit Diagnoses:  1. S/P trigger finger release     Plan: [name] is doing well overall today. She has been able to resume her activities with minimal pain. Incision healed with minimal swelling. She can return to us  on an as needed basis.   Follow-up: No follow-ups on file.   Meds & Orders: No orders of the defined types were placed in this encounter.  No orders of the defined types were placed in this encounter.    Procedures: No procedures performed       Objective:   Vital Signs: There were no vitals taken for this visit.  Ortho Exam Right hand: - Well-healing incision at the base of the long finger, skin is well-approximated, no erythema or drainage - Able to perform full digital range of motion without residual clicking or locking - Sensation intact distally, hand is warm well-perfused   Imaging: No results found.   Joesph Dinsmore, OPA-C  "

## 2024-07-01 ENCOUNTER — Encounter: Payer: Self-pay | Admitting: Internal Medicine

## 2024-07-01 ENCOUNTER — Ambulatory Visit (INDEPENDENT_AMBULATORY_CARE_PROVIDER_SITE_OTHER)

## 2024-07-01 ENCOUNTER — Ambulatory Visit: Admitting: Internal Medicine

## 2024-07-01 ENCOUNTER — Ambulatory Visit

## 2024-07-01 VITALS — BP 108/70 | HR 75 | Temp 98.3°F | Ht 63.0 in | Wt 112.0 lb

## 2024-07-01 VITALS — BP 108/70 | HR 75 | Ht 63.0 in | Wt 112.4 lb

## 2024-07-01 DIAGNOSIS — Z0000 Encounter for general adult medical examination without abnormal findings: Secondary | ICD-10-CM

## 2024-07-01 DIAGNOSIS — E108 Type 1 diabetes mellitus with unspecified complications: Secondary | ICD-10-CM

## 2024-07-01 DIAGNOSIS — Z23 Encounter for immunization: Secondary | ICD-10-CM

## 2024-07-01 DIAGNOSIS — E1069 Type 1 diabetes mellitus with other specified complication: Secondary | ICD-10-CM | POA: Diagnosis not present

## 2024-07-01 DIAGNOSIS — M5441 Lumbago with sciatica, right side: Secondary | ICD-10-CM | POA: Diagnosis not present

## 2024-07-01 DIAGNOSIS — E782 Mixed hyperlipidemia: Secondary | ICD-10-CM | POA: Diagnosis not present

## 2024-07-01 LAB — CBC
HCT: 35.2 % — ABNORMAL LOW (ref 36.0–46.0)
Hemoglobin: 11.3 g/dL — ABNORMAL LOW (ref 12.0–15.0)
MCHC: 32.1 g/dL (ref 30.0–36.0)
MCV: 76.9 fl — ABNORMAL LOW (ref 78.0–100.0)
Platelets: 254 K/uL (ref 150.0–400.0)
RBC: 4.58 Mil/uL (ref 3.87–5.11)
RDW: 18.7 % — ABNORMAL HIGH (ref 11.5–15.5)
WBC: 5 K/uL (ref 4.0–10.5)

## 2024-07-01 LAB — COMPREHENSIVE METABOLIC PANEL WITH GFR
ALT: 25 U/L (ref 3–35)
AST: 30 U/L (ref 5–37)
Albumin: 4.3 g/dL (ref 3.5–5.2)
Alkaline Phosphatase: 42 U/L (ref 39–117)
BUN: 21 mg/dL (ref 6–23)
CO2: 36 meq/L — ABNORMAL HIGH (ref 19–32)
Calcium: 9.5 mg/dL (ref 8.4–10.5)
Chloride: 100 meq/L (ref 96–112)
Creatinine, Ser: 0.79 mg/dL (ref 0.40–1.20)
GFR: 75.91 mL/min
Glucose, Bld: 108 mg/dL — ABNORMAL HIGH (ref 70–99)
Potassium: 4.2 meq/L (ref 3.5–5.1)
Sodium: 140 meq/L (ref 135–145)
Total Bilirubin: 0.5 mg/dL (ref 0.2–1.2)
Total Protein: 6.9 g/dL (ref 6.0–8.3)

## 2024-07-01 LAB — LIPID PANEL
Cholesterol: 124 mg/dL (ref 28–200)
HDL: 81.1 mg/dL
LDL Cholesterol: 28 mg/dL (ref 10–99)
NonHDL: 42.75
Total CHOL/HDL Ratio: 2
Triglycerides: 72 mg/dL (ref 10.0–149.0)
VLDL: 14.4 mg/dL (ref 0.0–40.0)

## 2024-07-01 LAB — VITAMIN D 25 HYDROXY (VIT D DEFICIENCY, FRACTURES): VITD: 37.66 ng/mL (ref 30.00–100.00)

## 2024-07-01 LAB — VITAMIN B12: Vitamin B-12: >1500 pg/mL — ABNORMAL HIGH (ref 211–911)

## 2024-07-01 NOTE — Progress Notes (Signed)
 ;  Subjective:   Patient ID: Leslie Lane, female    DOB: 13-Feb-1956, 68 y.o.   MRN: [id number]  Discussed the use of AI scribe software for clinical note transcription with the patient, who gave verbal consent to proceed.  History of Present Illness Leslie Lane is a 68 year old female with diabetes who presents with concerns about blood sugar management and mobility issues.  She is experiencing significant stress due to her mother's recent hospitalization following a fall that resulted in a fractured leg and injuries to her left hand. Her mother is currently awaiting a bed for rehabilitation, which may take up to two months. This situation has left her feeling anxious about living alone, as she has never lived by herself before and is concerned about managing her diabetes without support.  She tends to overeat in the evenings due to fear of nocturnal hypoglycemia, having experienced low blood sugar episodes in the past that required emergency medical attention. Her blood sugar was 300 mg/dL this morning, which she finds preferable to hypoglycemia. She reports that she eats ice cream, which she believes affects her blood sugar levels.  She describes weakness in her legs, which causes her to feel unsteady and at risk of falling. She has had incidents of falling at home, including one while brushing her teeth. Her legs feel weaker with prolonged walking, and she reports needing to hold onto shelves for support when walking in public places. She also reports lower back pain and numbness in her right arm; she mentions having had hand surgery in the past.  She mentions a recent episode of diarrhea, which she associates with consuming too much ice cream. She is aware that ice cream affects her blood sugar levels but continues to consume it as it is her primary source of milk.   Review of Systems  Constitutional:  Positive for activity change.  HENT: Negative.    Eyes: Negative.    Respiratory:  Negative for cough, chest tightness and shortness of breath.   Cardiovascular:  Negative for chest pain, palpitations and leg swelling.  Gastrointestinal:  Negative for abdominal distention, abdominal pain, constipation, diarrhea, nausea and vomiting.  Musculoskeletal:  Positive for arthralgias, gait problem and myalgias.  Skin: Negative.   Neurological:  Positive for weakness.  Psychiatric/Behavioral: Negative.      Objective:  Physical Exam Constitutional:      Appearance: She is well-developed.  HENT:     Head: Normocephalic and atraumatic.  Cardiovascular:     Rate and Rhythm: Normal rate and regular rhythm.  Pulmonary:     Effort: Pulmonary effort is normal. No respiratory distress.     Breath sounds: Normal breath sounds. No wheezing or rales.  Abdominal:     General: Bowel sounds are normal. There is no distension.     Palpations: Abdomen is soft.     Tenderness: There is no abdominal tenderness.  Musculoskeletal:        General: Tenderness present.     Cervical back: Normal range of motion.  Skin:    General: Skin is warm and dry.  Neurological:     Mental Status: She is alert and oriented to person, place, and time.     Coordination: Coordination normal.     Vitals:   07/01/24 1258  BP: 108/70  Pulse: 75  Temp: 98.3 F (36.8 C)  TempSrc: Oral  SpO2: 97%  Weight: 112 lb (50.8 kg)  Height: 5' 3 (1.6 m)  Assessment and Plan Assessment & Plan Type 1 diabetes mellitus with hypoglycemia and hyperglycemia   She experiences episodes of hypoglycemia and hyperglycemia, with recent hyperglycemia at 300 mg/dL. There is concern for nocturnal hypoglycemia due to past emergency interventions and dietary indiscretion. Continue regular blood glucose monitoring. Discussed need for assistance during hypoglycemic events. Checking CMP and lipid panel. Seeing endo and no low sugar episodes recently will leave regimen same for now.  Acute right-sided low back  pain with right-sided sciatica   She has acute right-sided low back pain with sciatica and leg weakness, possibly related to previous back issues. Differential includes back or hip changes. Ordered x-ray of back and right hip.   Mixed diabetic hyperlipidemia assoc with type 1 DM Checking lipid panel and adjust regimen as needed.

## 2024-07-01 NOTE — Patient Instructions (Signed)
 Leslie Lane,  Thank you for taking the time for your Medicare Wellness Visit. I appreciate your continued commitment to your health goals. Please review the care plan we discussed, and feel free to reach out if I can assist you further.  Please note that Annual Wellness Visits do not include a physical exam. Some assessments may be limited, especially if the visit was conducted virtually. If needed, we may recommend an in-person follow-up with your provider.  Ongoing Care Seeing your primary care provider every 3 to 6 months helps us  monitor your health and provide consistent, personalized care.   Referrals If a referral was made during today's visit and you haven't received any updates within two weeks, please contact the referred provider directly to check on the status.  Recommended Screenings:  Health Maintenance  Topic Date Due   Zoster (Shingles) Vaccine (1 of 2) 04/20/2004   DTaP/Tdap/Td vaccine (2 - Tdap) 07/27/2021   Eye exam for diabetics  01/07/2022   Flu Shot  02/01/2024   COVID-19 Vaccine (4 - 2025-26 season) 03/03/2024   Yearly kidney function blood test for diabetes  06/20/2024   Complete foot exam   07/31/2024   Hemoglobin A1C  10/20/2024   Yearly kidney health urinalysis for diabetes  12/30/2024   Screening for Lung Cancer  03/31/2025   Medicare Annual Wellness Visit  07/01/2025   Breast Cancer Screening  01/24/2026   Colon Cancer Screening  12/06/2031   Pneumococcal Vaccine for age over 20  Completed   Osteoporosis screening with Bone Density Scan  Completed   Hepatitis C Screening  Completed   Meningitis B Vaccine  Aged Out   Hepatitis B Vaccine  Discontinued       07/01/2024   12:39 PM  Advanced Directives  Does Patient Have a Medical Advance Directive? No  Would patient like information on creating a medical advance directive? No - Patient declined    Vision: Annual vision screenings are recommended for early detection of glaucoma, cataracts, and  diabetic retinopathy. These exams can also reveal signs of chronic conditions such as diabetes and high blood pressure.  Dental: Annual dental screenings help detect early signs of oral cancer, gum disease, and other conditions linked to overall health, including heart disease and diabetes.

## 2024-07-01 NOTE — Progress Notes (Signed)
 Leslie Lane                                          MRN: [id number]   07/01/2024   The [facility] Quality Team Specialist reviewed this patient medical record for the purposes of chart review for care gap closure. The following were reviewed: chart review for care gap closure-glycemic status assessment and kidney health evaluation for diabetes:eGFR  and uACR.    [facility] Quality Team

## 2024-07-01 NOTE — Patient Instructions (Addendum)
 We will check the labs today and the x-ray of the back and right hip.

## 2024-07-01 NOTE — Progress Notes (Signed)
 "  Chief Complaint  Patient presents with   Medicare Wellness     Subjective:   Leslie Lane is a 68 y.o. female who presents for a Medicare Annual Wellness Visit.  Visit info / Clinical Intake: Medicare Wellness Visit Type:: Subsequent Annual Wellness Visit Persons participating in visit and providing information:: patient Medicare Wellness Visit Mode:: In-person (required for WTM) Interpreter Needed?: No Pre-visit prep was completed: yes AWV questionnaire completed by patient prior to visit?: no Living arrangements:: (!) lives alone Patient's Overall Health Status Rating: good Typical amount of pain: none Does pain affect daily life?: no Are you currently prescribed opioids?: no  Dietary Habits and Nutritional Risks How many meals a day?: 3 Eats fruit and vegetables daily?: yes Most meals are obtained by: preparing own meals; eating out In the last 2 weeks, have you had any of the following?: none Diabetic:: (!) yes Any non-healing wounds?: no How often do you check your BS?: 4; as needed (fasting - >300; at 12:46p - 104) Would you like to be referred to a Nutritionist or for Diabetic Management? : no  Functional Status Activities of Daily Living (to include ambulation/medication): Independent Ambulation: Independent with device- listed below Home Assistive Devices/Equipment: Eyeglasses; Johna Finder (specify Type) Medication Administration: Independent Home Management (perform basic housework or laundry): Independent Manage your own finances?: yes Primary transportation is: driving Concerns about vision?: no *vision screening is required for WTM* Concerns about hearing?: no  Fall Screening Falls in the past year?: 1 Number of falls in past year: 1 (4) Was there an injury with Fall?: 0 Fall Risk Category Calculator: 2 Patient Fall Risk Level: Moderate Fall Risk  Fall Risk Patient at Risk for Falls Due to: Impaired balance/gait Fall risk Follow up: Falls  evaluation completed; Falls prevention discussed  Home and Transportation Safety: All rugs have non-skid backing?: N/A, no rugs All stairs or steps have railings?: yes (outside) Grab bars in the bathtub or shower?: (!) no Have non-skid surface in bathtub or shower?: yes Good home lighting?: yes Regular seat belt use?: yes Hospital stays in the last year:: no  Cognitive Assessment Difficulty concentrating, remembering, or making decisions? : no Will 6CIT or Mini Cog be Completed: yes What year is it?: 0 points What month is it?: 0 points Give patient an address phrase to remember (5 components): [address] [place], [place] About what time is it?: 0 points Count backwards from 20 to 1: 0 points Say the months of the year in reverse: 2 points (June) Repeat the address phrase from earlier: 2 points (Drive) 6 CIT Score: 4 points  Advance Directives (For Healthcare) Does Patient Have a Medical Advance Directive?: No Would patient like information on creating a medical advance directive?: No - Patient declined  Reviewed/Updated  Reviewed/Updated: Reviewed All (Medical, Surgical, Family, Medications, Allergies, Care Teams, Patient Goals)    Allergies (verified) Penicillins and Sulfonamide derivatives   Current Medications (verified) Outpatient Encounter Medications as of 07/01/2024  Medication Sig   Accu-Chek Softclix Lancets lancets Use as instructed to check blood sugar 4 times per day dx code E10.65   acetaminophen -codeine  (TYLENOL  #3) 300-30 MG tablet Take 1 tablet by mouth every 6 (six) hours as needed.   Alcohol Swabs (B-D SINGLE USE SWABS REGULAR) PADS USE FOUR TIMES DAILY   aspirin EC 81 MG tablet Take 81 mg by mouth every morning.   B-D ULTRAFINE III SHORT PEN 31G X 8 MM MISC USE TO INJECT WITH PRE-FILLED PEN SYRINGE SUB-Q DAILY  Blood Glucose Calibration (ACCU-CHEK AVIVA) SOLN USE AS DIRECTED   Blood Glucose Monitoring Suppl (ACCU-CHEK AVIVA PLUS) w/Device KIT USE AS  DIRECTED   cetirizine  (ZYRTEC ) 10 MG tablet Take 1 tablet (10 mg total) by mouth daily.   Clobetasol Propionate 0.05 % shampoo Apply topically.   Continuous Glucose Receiver (FREESTYLE LIBRE 3 READER) DEVI 1 Device by Does not apply route continuous.   Continuous Glucose Sensor (FREESTYLE LIBRE 3 PLUS SENSOR) MISC 1 each by Does not apply route continuous. Change every 15 days.   cyclobenzaprine  (FLEXERIL ) 5 MG tablet Take 1 tablet (5 mg total) by mouth 3 (three) times daily as needed for muscle spasms.   dapagliflozin  propanediol (FARXIGA ) 10 MG TABS tablet Take 1 tablet (10 mg total) by mouth daily.   donepezil  (ARICEPT ) 10 MG tablet TAKE 1 TABLET BY MOUTH AT NIGHT   ezetimibe  (ZETIA ) 10 MG tablet Take 1 tablet (10 mg total) by mouth daily.   fexofenadine (ALLEGRA) 180 MG tablet Take 180 mg by mouth daily.   Glucagon  (GVOKE HYPOPEN  2-PACK) 1 MG/0.2ML SOAJ INJECT 1 MG SUBCUTANEOUS AS NEEDED FOR LOW BLOOD SUGAR   glucose blood (ACCU-CHEK AVIVA PLUS) test strip CHECK BLOOD SUGAR 4 TIMES DAILY   hyoscyamine  (LEVBID ) 0.375 MG 12 hr tablet Take 1 tablet (0.375 mg total) by mouth 2 (two) times daily.   insulin  glargine (LANTUS ) 100 UNIT/ML Solostar Pen Take 5 units in the morning and 5 units at bedtime.   insulin  lispro (HUMALOG  KWIKPEN) 100 UNIT/ML KwikPen INJECT SUBCUTANEOUSLY 2-5  UNITS 3 TIMES DAILY AS INSTRUCTED MAXIMUM 20 UNITS A DAY.   Insulin  Pen Needle 32G X 4 MM MISC Use inject insulin  tid   Insulin  Pen Needle 32G X 4 MM MISC Use 4x a day   Insulin  Syringe-Needle U-100 (B-D INSULIN  SYRINGE) 31G X 5/16 0.3 ML MISC Use to administer insulin    lamoTRIgine  (LAMICTAL ) 100 MG tablet Take 1 tablet (100 mg total) by mouth 2 (two) times daily.   Lancets MISC Use to check blood sugar once a day   lipase/protease/amylase (CREON ) 36000 UNITS CPEP capsule Take 2 capsules by mouth three times a day with meals and 1 capsule by mouth twice daily with snacks   Magnesium  500 MG TABS Take 1 tablet by mouth  daily.   metoprolol  succinate (TOPROL -XL) 25 MG 24 hr tablet TAKE 1 TABLET(25 MG) BY MOUTH DAILY   Multiple Vitamin (MULTIVITAMIN) tablet Take 1 tablet by mouth daily. Reported on 11/22/2015   Omega-3 Fatty Acids (FISH OIL) 1000 MG CAPS Take 1 capsule by mouth daily.   omeprazole  (PRILOSEC) 40 MG capsule TAKE 1 CAPSULE(40 MG) BY MOUTH IN THE MORNING AND AT BEDTIME   rosuvastatin  (CRESTOR ) 40 MG tablet Take 1 tablet (40 mg total) by mouth daily.   traZODone  (DESYREL ) 150 MG tablet Take 1 tablet (150 mg total) by mouth at bedtime.   triamcinolone  cream (KENALOG) 0.1 %    umeclidinium-vilanterol (ANORO ELLIPTA ) 62.5-25 MCG/ACT AEPB Inhale 1 puff into the lungs daily.   vitamin E 1000 UNIT capsule Take 1,000 Units by mouth daily.   No facility-administered encounter medications on file as of 07/01/2024.    History: Past Medical History:  Diagnosis Date   Abdominal pain 02/28/2016   Abnormal CT scan, bladder    Noted 07/28/13 CT - s/p uro eval 08/2013 [place] -    Acquired trigger finger of left ring finger 02/18/2019   Injected February 18, 2019 and March 2022   Allergic rhinitis  Anemia    Arthritis    Bipolar disorder 07/05/2010   Cataract    Chronic lower back pain 07/05/2010   Chronic neck pain    Diabetic gastroparesis associated with type 1 diabetes mellitus 10/24/2016   Fibromyalgia    Generalized anxiety disorder 04/27/2014   GERD (gastroesophageal reflux disease) 07/05/2010   Hepatitis B virus infection 07/05/2010   History of substance abuse    opiates; cocaine   Hypoglycemia unawareness associated with type 1 diabetes mellitus 11/23/2020   Insomnia 04/13/2015   Major depressive disorder 07/28/2014   Migraine headache 07/05/2010   Mild neurocognitive disorder due to multiple etiologies 03/15/2021   Mixed diabetic hyperlipidemia associated with type 1 diabetes mellitus 07/05/2010   Neuropathy    Protein calorie malnutrition 06/01/2017   Right shoulder pain 02/18/2018    Seizures    pt states, if my blood sugar drops to the 20's, I convulse.  It hasn't happened in a long time.   Smokers' cough 07/05/2010   Tubular adenoma of colon 2018   Type 1 diabetes mellitus with complication 02/06/2013   WBC decreased 03/27/2017   Past Surgical History:  Procedure Laterality Date   CESAREAN SECTION     COLONOSCOPY     TUBAL LIGATION     UPPER GASTROINTESTINAL ENDOSCOPY     Family History  Problem Relation Age of Onset   Arthritis Mother    Diverticulosis Mother    Kidney disease Mother    Hyperlipidemia Mother    Arthritis Father    Kidney disease Father    Kidney cancer Father    Bladder Cancer Sister    Colon cancer Sister 29   Celiac disease Sister    Heart attack Brother    Kidney cancer Paternal Aunt    Diabetes Niece    Hyperlipidemia Other    Anxiety disorder Neg Hx    Bipolar disorder Neg Hx    Depression Neg Hx    Breast cancer Neg Hx    Esophageal cancer Neg Hx    Stomach cancer Neg Hx    Rectal cancer Neg Hx    Social History   Occupational History   Occupation: Disability  Tobacco Use   Smoking status: Every Day    Current packs/day: 1.50    Average packs/day: 1.5 packs/day for 50.0 years (75.0 ttl pk-yrs)    Types: Cigarettes   Smokeless tobacco: Never   Tobacco comments:    Reports she has cut back to a cartoon that now lasts 2 weeks.   Vaping Use   Vaping status: Former  Substance and Sexual Activity   Alcohol use: Yes    Alcohol/week: 7.0 standard drinks of alcohol    Types: 7 Cans of beer per week    Comment: 1 beer nightly   Drug use: Not Currently    Types: Cocaine    Comment: past opiate and cocaine abuse   Sexual activity: Not Currently    Birth control/protection: Surgical, Post-menopausal   Tobacco Counseling Ready to quit: No Counseling given: Yes Tobacco comments: Reports she has cut back to a cartoon that now lasts 2 weeks.   SDOH Screenings   Food Insecurity: No Food Insecurity (07/01/2024)   Housing: Unknown (07/01/2024)  Transportation Needs: No Transportation Needs (07/01/2024)  Utilities: Not At Risk (07/01/2024)  Alcohol Screen: Low Risk (03/14/2022)  Depression (PHQ2-9): High Risk (07/01/2024)  Financial Resource Strain: Low Risk (03/14/2022)  Physical Activity: Inactive (07/01/2024)  Social Connections: Moderately Integrated (07/01/2024)  Stress: Stress Concern Present (  07/01/2024)  Tobacco Use: High Risk (07/01/2024)  Health Literacy: Adequate Health Literacy (07/01/2024)   See flowsheets for full screening details  Depression Screen PHQ 2 & 9 Depression Scale- Over the past 2 weeks, how often have you been bothered by any of the following problems? Little interest or pleasure in doing things: 0 Feeling down, depressed, or hopeless (PHQ Adolescent also includes...irritable): 3 PHQ-2 Total Score: 3 Trouble falling or staying asleep, or sleeping too much: 3 Feeling tired or having little energy: 2 Poor appetite or overeating (PHQ Adolescent also includes...weight loss): 0 Feeling bad about yourself - or that you are a failure or have let yourself or your family down: 3 (mother is in the hospital) Trouble concentrating on things, such as reading the newspaper or watching television (PHQ Adolescent also includes...like school work): 3 Moving or speaking so slowly that other people could have noticed. Or the opposite - being so fidgety or restless that you have been moving around a lot more than usual: 1 Thoughts that you would be better off dead, or of hurting yourself in some way: 0 PHQ-9 Total Score: 15 If you checked off any problems, how difficult have these problems made it for you to do your work, take care of things at home, or get along with other people?: Somewhat difficult  Depression Treatment Depression Interventions/Treatment : Medication; Currently on Treatment; Counseling (sees Psychiatrist q )     Goals Addressed               This Visit's  Progress     Patient Stated (pt-stated)        Patient stated she plans to use cane/walker more to prevent falls             Objective:    Today's Vitals   07/01/24 1238  BP: 108/70  Pulse: 75  SpO2: 97%  Weight: 112 lb 6.4 oz (51 kg)  Height: 5' 3 (1.6 m)   Body mass index is 19.91 kg/m.  Hearing/Vision screen Hearing Screening - Comments:: Denies hearing difficulties   Vision Screening - Comments:: Wears rx glasses - up to date with routine eye exams with [facility] Immunizations and Health Maintenance Health Maintenance  Topic Date Due   Zoster Vaccines- Shingrix (1 of 2) 04/20/2004   DTaP/Tdap/Td (2 - Tdap) 07/27/2021   COVID-19 Vaccine (4 - 2025-26 season) 03/03/2024   Diabetic kidney evaluation - eGFR measurement  06/20/2024   FOOT EXAM  07/31/2024   HEMOGLOBIN A1C  10/20/2024   Diabetic kidney evaluation - Urine ACR  12/30/2024   Lung Cancer Screening  03/31/2025   OPHTHALMOLOGY EXAM  03/31/2025   Medicare Annual Wellness (AWV)  07/01/2025   Mammogram  01/24/2026   Colonoscopy  12/06/2031   Pneumococcal Vaccine: 50+ Years  Completed   Influenza Vaccine  Completed   Bone Density Scan  Completed   Hepatitis C Screening  Completed   Meningococcal B Vaccine  Aged Out   Hepatitis B Vaccines 19-59 Average Risk  Discontinued        Assessment/Plan:  This is a routine wellness examination for Leslie Lane.  Patient Care Team: Rollene Almarie LABOR, MD as PCP - General (Internal Medicine) O'Neal, Darryle Ned, MD as PCP - Cardiology (Cardiology) Domenica Reusing, MD as Consulting Physician (Psychiatry) Jess Roby Masker, MD as Consulting Physician (Psychiatry) Aneita Gwendlyn DASEN, MD (Inactive) (Gastroenterology) Matilda Senior, MD (Urology) Georjean Darice HERO, MD as Consulting Physician (Neurology) Szabat, Toribio BROCKS, [facility] (Inactive) as Pharmacist (Pharmacist) Lelon,  Glendia DASEN, PA-C as Physician Assistant (Cardiology) Group, [facility]  I have personally  reviewed and noted the following in the patients chart:   Medical and social history Use of alcohol, tobacco or illicit drugs  Current medications and supplements including opioid prescriptions. Functional ability and status Nutritional status Physical activity Advanced directives List of other physicians Hospitalizations, surgeries, and ER visits in previous 12 months Vitals Screenings to include cognitive, depression, and falls Referrals and appointments  Orders Placed This Encounter  Procedures   Flu vaccine HIGH DOSE PF(Fluzone Trivalent)   In addition, I have reviewed and discussed with patient certain preventive protocols, quality metrics, and best practice recommendations. A written personalized care plan for preventive services as well as general preventive health recommendations were provided to patient.   Leslie Lane, CMA   07/01/2024   Return in 1 year (on 07/01/2025).  After Visit Summary: (In Person-Declined) Patient declined AVS at this time.  Nurse Notes: scheduled 2026 AWV appt "

## 2024-07-02 MED ORDER — ROSUVASTATIN CALCIUM 40 MG PO TABS: 40.0000 mg | ORAL_TABLET | Freq: Every day | ORAL | 1 refills | Status: AC

## 2024-07-08 ENCOUNTER — Ambulatory Visit: Payer: Self-pay | Admitting: Internal Medicine

## 2024-07-09 NOTE — Progress Notes (Addendum)
 "   Mild Cognitive Impairment likely of multiple etiologies   Leslie Lane is a very pleasant 69 y.o. RH female with a history of type 1 diabetes mellitus, hyperlipidemia, fibromyalgia, history of migraines, major depressive disorder, GAD, bipolar disorder, cutaneous lupus and with a history of mild cognitive impairment per neuropsych evaluation 03/29/2021 (which was discontinued prematurely due to anxiety)  presenting today in follow-up for evaluation of memory loss. Patient is on donepezil  10 mg daily, tolerating well. Patient was last seen on  01/08/2024 .  Despite subjective memory concerns, her MMSE today is slightly worse at 29/30.  We discussed initiating a low-dose of memantine  5 mg twice daily, she agrees to proceed.  She has some difficulty participating in all her ADLs due to orthopedic issues, including a new onset of right hip pain, which affects her right leg, without any signs or symptoms of a stroke.  Patient is being followed by PCP, and is expected to be seen by specialist soon.  As recall, she has a history of arthritis, fibromyalgia which may exacerbate the symptoms.  She continues to drive although this is being affected by her right hip pain.  Mood is more anxious than prior, she is followed by psychiatry, and is due to be seen again for medication management in the next few weeks.   This patient is here alone. Previous records as well as any outside records available were reviewed prior to todays visit   Continue donepezil  10 mg daily Start memantine  5 mg twice daily as directed, side effects discussed Tobacco cessation counseled Continue to control social anxiety disorder, severe MDD as per psychiatry and counseling Recommend increasing socialization and physical activities Continue to control mood as per PCP Recommend good control of cardiovascular risk factors Follow-up with orthopedics regarding right hip pain and mobility, she may benefit from an OT and PT for strength and  balance [place] up in 6 months      Discussed the use of AI scribe software for clinical note transcription with the patient, who gave verbal consent to proceed.  History of Present Illness Leslie Lane is a 69 year old female who presents with worsening memory and leg weakness.  Since the last visit on July 8th, she has reported worsening memory issues. She is currently taking donepezil  10 mg daily but feels it is not effective enough. She has slightly more difficulty retrieving words, remembering conversations, and names, and often misplaces items around the house.  She does engage in brain stimulating exercises.   She has a history of major depression and anxiety, and she feels more stressed and irritable recently. She is under psychiatric care and was prescribed medication to help with concentration and focus, but she felt it made her feel like a 'zombie' and possibly more edgy. No confusion about her location or hallucinations, but increased irritability and mood swings are present.  A new symptom of leg pain, and decreased mobility began in November of last year   She describes difficulty lifting her right leg, especially when driving, and has experienced falls at home. With a history of arthritis in her knee and back, she suspects her hip may also be involved. She uses a walker for support and reports limping due to the pain.  In addition, as mentioned above, she has chronic pain due to fibromyalgia that affects her right shoulder and neck.  She wants to see orthopedics and her family physician is working on this issue.  She is interested  in OT and PT and will discuss this next week with her PCP.  She lives alone now as her mother, whom she was caring for, is currently at [facility] after sustaining injuries from a fall. She spends her days watching TV and reports no issues with cooking or leaving the stove on. She has a good appetite but tends to overeat in the evenings,  particularly favoring ice cream and cheese puffs. She smokes a pack of cigarettes a day, especially when stressed.  No major headaches, vision problems, or signs of a stroke are reported. She experiences urge incontinence.  She drives only during the day to familiar places.  She takes trazodone  for sleep and reports no major nightmares or REM behavior disorder symptoms.     History on Initial Assessment 12/18/2018: This is a 68 year old woman with a history of type I DM, hyperlipidemia, fibromyalgia, migraines, major depressive disorder, GAD, rule out bipolar disorder, presenting for evaluation of memory loss. She is alone in the office with no family to corroborate history. She states memory changes started after she was in the hospital in a diabetic coma at age 89. She states she has not noticed any changes since then, memory has been about the same since then. She initially could not drive a car or remember a movie she had watched. She acquired a guardian to manage her finances. She lives with her mother. She has been told she repeats herself and says the same thing every morning. She used to leave the stove on, but has been more vigilant. She has been driving and has gotten lost driving in unfamiliar roads. She manages her medications independently. She states she does not like math and was not good in school. She has reported her memory concerns to her psychiatrist and reported that she cannot focus on anything. She is easily irritable. No paranoia or hallucinations. Sleep is good with Trazodone . No family history of dementia. No history of significant head injuries. She used to drink heavily when younger, then cut down to a beer every now and then after her diabetic coma.    She has headaches that improve when she is not smoking. She has a history of migraines and has mild bitemporal headaches, taking daily magnesium . She has chronic neck and back pain and neuropathy in her feet. No dizziness, vision  changes, focal weakness, bowel/bladder dysfunction, anosmia, or tremors.     Neurocognitive testing 03/29/21 Dr. Richie Briefly, Leslie Lane exhibited very limited testing tolerance and abruptly discontinued the evaluation prematurely. Unfortunately, given the abbreviated nature of testing, several domains were unable to be thoroughly assessed and the current conceptualization is limited by this. Leslie Lane pattern of performance is suggestive of prominent difficulties with basic attention, expressive language, visuospatial abilities, and encoding (i.e., learning) aspects of memory. Overall, remote substance abuse history, as well as chronic and acute psychiatric distress, is very likely exacerbating deficits related to various vascular conditions. Ongoing sleep dysfunction, chronic pain, and frequent headaches would also contribute to dysfunction.      Past Medical History:  Diagnosis Date   Abdominal pain 02/28/2016   Abnormal CT scan, bladder    Noted 07/28/13 CT - s/p uro eval 08/2013 [place] -    Acquired trigger finger of left ring finger 02/18/2019   Injected February 18, 2019 and March 2022   Allergic rhinitis    Anemia    Arthritis    Bipolar disorder 07/05/2010   Cataract    Chronic lower back  pain 07/05/2010   Chronic neck pain    Diabetic gastroparesis associated with type 1 diabetes mellitus 10/24/2016   Fibromyalgia    Generalized anxiety disorder 04/27/2014   GERD (gastroesophageal reflux disease) 07/05/2010   Hepatitis B virus infection 07/05/2010   History of substance abuse    opiates; cocaine   Hypoglycemia unawareness associated with type 1 diabetes mellitus 11/23/2020   Insomnia 04/13/2015   Major depressive disorder 07/28/2014   Migraine headache 07/05/2010   Mild neurocognitive disorder due to multiple etiologies 03/15/2021   Mixed diabetic hyperlipidemia associated with type 1 diabetes mellitus 07/05/2010   Neuropathy    Protein calorie malnutrition 06/01/2017    Right shoulder pain 02/18/2018   Seizures    pt states, if my blood sugar drops to the 20's, I convulse.  It hasn't happened in a long time.   Smokers' cough 07/05/2010   Tubular adenoma of colon 2018   Type 1 diabetes mellitus with complication 02/06/2013   WBC decreased 03/27/2017     Past Surgical History:  Procedure Laterality Date   CESAREAN SECTION     COLONOSCOPY     TUBAL LIGATION     UPPER GASTROINTESTINAL ENDOSCOPY           Objective:     PHYSICAL EXAMINATION:    VITALS:   Vitals:   07/11/24 1243  BP: (!) 187/45  Pulse: 84  Resp: 20  SpO2: 96%  Weight: 110 lb (49.9 kg)  Height: 5' 3 (1.6 m)    GEN:  The patient appears stated age and is in NAD but very uncomfortable due to right hip issues.  Very anxious appearing, moving very frequently HEENT:  Normocephalic, atraumatic.   Neurological examination:  General: NAD, well-groomed, appears stated age. Orientation: The patient is alert. Oriented to person, place and to date.   Cranial nerves: There is good facial symmetry.The speech is fluent and clear. No aphasia or dysarthria. Fund of knowledge is appropriate. Recent memory impaired and remote memory is normal.  Attention and concentration are normal.  Able to name objects and repeat phrases.  Hearing is intact to conversational tone  .   Delayed recall 3/3  sensation: Sensation is intact to light touch throughout Motor: Strength is at least antigravity x4.  She can only stay a couple of seconds in a position, due to pain on the right hip DTR's 2/4 in UE/LE      07/30/2019    1:00 PM 12/18/2018   11:00 AM  Montreal Cognitive Assessment   Visuospatial/ Executive (0/5)  1  Naming (0/3)  3  Attention: Read list of digits (0/2) 1 1  Attention: Read list of letters (0/1) 1 1  Attention: Serial 7 subtraction starting at 100 (0/3) 1 2  Language: Repeat phrase (0/2) 0 0  Language : Fluency (0/1) 0 0  Abstraction (0/2) 2 0  Delayed Recall (0/5) 4 0   Orientation (0/6) 6 5  Total  13  Adjusted Score (based on education)  14       07/11/2024    1:00 PM 01/08/2024    6:00 PM 08/30/2023    4:00 PM  MMSE - Mini Mental State Exam  Orientation to time 5 5 5   Orientation to Place 5 5 5   Registration 3 3 3   Attention/ Calculation 5 5 5   Recall 3 3 3   Language- name 2 objects 2 2 2   Language- repeat 1 1 1   Language- follow 3 step command 3 3 3  Language- read & follow direction 1 1 1   Write a sentence 1 1 1   Copy design 0 1 1  Total score 29 30 30       Movement examination: Tone: There is normal tone in the UE/LE Abnormal movements:  no tremor.  No myoclonus.  No asterixis.   Coordination:  There is no decremation with RAM's. Normal finger to nose  Gait and Station: The patient has difficulty arising out of a deep-seated chair without the use of the hands due to pain. The patient's stride length is good but with discomfort on both legs.  Gait is very cautious and narrow.   Thank you for allowing us  the opportunity to participate in the care of this nice patient. Please do not hesitate to contact us  for any questions or concerns.   Total time spent on today's visit was 32 minutes dedicated to this patient today, preparing to see patient, examining the patient, ordering tests and/or medications and counseling the patient, documenting clinical information in the EHR or other health record, independently interpreting results and communicating results to the patient/family, discussing treatment and goals, answering patient's questions and coordinating care.  Cc:  Rollene Almarie LABOR, MD  Camie Sevin 07/11/2024 1:48 PM      "

## 2024-07-11 ENCOUNTER — Ambulatory Visit: Admitting: Physician Assistant

## 2024-07-11 ENCOUNTER — Encounter: Payer: Self-pay | Admitting: Physician Assistant

## 2024-07-11 VITALS — BP 187/45 | HR 84 | Resp 20 | Ht 63.0 in | Wt 110.0 lb

## 2024-07-11 DIAGNOSIS — F0670 Mild neurocognitive disorder due to known physiological condition without behavioral disturbance: Secondary | ICD-10-CM | POA: Diagnosis not present

## 2024-07-11 MED ORDER — DONEPEZIL HCL 10 MG PO TABS: 10.0000 mg | ORAL_TABLET | Freq: Every day | ORAL | 5 refills | Status: AC

## 2024-07-11 MED ORDER — MEMANTINE HCL 5 MG PO TABS: ORAL_TABLET | ORAL | 11 refills | Status: AC

## 2024-07-11 NOTE — Patient Instructions (Addendum)
 It was a pleasure to see you today at our office.   Recommendations:  Meds:  Continue Donepezil  10 mg: take 1 tablet in the morning  Start Memantine  5 mg: Take 1 tablet (5 mg at night) for 2 weeks, then increase to 1 tablet (5 mg) twice a day, side effects discussed    Follow up in 6 months  Monitor your sugars  Follow up with psychiatry, GI, Orthopedics  Primary doctors  Stop smoking   Get involved with activities outside of the house   RECOMMENDATIONS FOR ALL PATIENTS WITH MEMORY PROBLEMS: 1. Continue to exercise (Recommend 30 minutes of walking everyday, or 3 hours every week) 2. Increase social interactions - continue going to [place] and enjoy social gatherings with friends and family 3. Eat healthy, avoid fried foods and eat more fruits and vegetables 4. Maintain adequate blood pressure, blood sugar, and blood cholesterol level. Reducing the risk of stroke and cardiovascular disease also helps promoting better memory. 5. Avoid stressful situations. Live a simple life and avoid aggravations. Organize your time and prepare for the next day in anticipation. 6. Sleep well, avoid any interruptions of sleep and avoid any distractions in the bedroom that may interfere with adequate sleep quality 7. Avoid sugar, avoid sweets as there is a strong link between excessive sugar intake, diabetes, and cognitive impairment We discussed the Mediterranean diet, which has been shown to help patients reduce the risk of progressive memory disorders and reduces cardiovascular risk. This includes eating fish, eat fruits and green leafy vegetables, nuts like almonds and hazelnuts, walnuts, and also use olive oil. Avoid fast foods and fried foods as much as possible. Avoid sweets and sugar as sugar use has been linked to worsening of memory function.  There is always a concern of gradual progression of memory problems. If this is the case, then we may need to adjust level of care according to patient needs.  Support, both to the patient and caregiver, should then be put into place.      You have been referred for a neuropsychological evaluation (i.e., evaluation of memory and thinking abilities). Please bring someone with you to this appointment if possible, as it is helpful for the doctor to hear from both you and another adult who knows you well. Please bring eyeglasses and hearing aids if you wear them.    The evaluation will take approximately 3 hours and has two parts:   The first part is a clinical interview with the neuropsychologist (Dr. Richie or Dr. Jackquline). During the interview, the neuropsychologist will speak with you and the individual you brought to the appointment.    The second part of the evaluation is testing with the doctor's technician Neal or Luke). During the testing, the technician will ask you to remember different types of material, solve problems, and answer some questionnaires. Your family member will not be present for this portion of the evaluation.   Please note: We must reserve several hours of the neuropsychologist's time and the psychometrician's time for your evaluation appointment. As such, there is a No-Show fee of $100. If you are unable to attend any of your appointments, please contact our office as soon as possible to reschedule.    FALL PRECAUTIONS: Be cautious when walking. Scan the area for obstacles that may increase the risk of trips and falls. When getting up in the mornings, sit up at the edge of the bed for a few minutes before getting out of bed. Consider elevating the  bed at the head end to avoid drop of blood pressure when getting up. Walk always in a well-lit room (use night lights in the walls). Avoid area rugs or power cords from appliances in the middle of the walkways. Use a walker or a cane if necessary and consider physical therapy for balance exercise. Get your eyesight checked regularly.  FINANCIAL OVERSIGHT: Supervision, especially oversight  when making financial decisions or transactions is also recommended.  HOME SAFETY: Consider the safety of the kitchen when operating appliances like stoves, microwave oven, and blender. Consider having supervision and share cooking responsibilities until no longer able to participate in those. Accidents with firearms and other hazards in the house should be identified and addressed as well.   ABILITY TO BE LEFT ALONE: If patient is unable to contact 911 operator, consider using LifeLine, or when the need is there, arrange for someone to stay with patients. Smoking is a fire hazard, consider supervision or cessation. Risk of wandering should be assessed by caregiver and if detected at any point, supervision and safe proof recommendations should be instituted.  MEDICATION SUPERVISION: Inability to self-administer medication needs to be constantly addressed. Implement a mechanism to ensure safe administration of the medications.   DRIVING: Regarding driving, in patients with progressive memory problems, driving will be impaired. We advise to have someone else do the driving if trouble finding directions or if minor accidents are reported. Independent driving assessment is available to determine safety of driving.   If you are interested in the driving assessment, you can contact the following:  [employer] in [place] [phone]  [facility] [phone]  [facility] [phone] or [phone]    Mediterranean Diet A Mediterranean diet refers to food and lifestyle choices that are based on the traditions of countries located on the [employer] [place]. This way of eating has been shown to help prevent certain conditions and improve outcomes for people who have chronic diseases, like kidney disease and heart disease. What are tips for following this plan? Lifestyle  Cook and eat meals together with your family, when  possible. Drink enough fluid to keep your urine clear or pale yellow. Be physically active every day. This includes: Aerobic exercise like running or swimming. Leisure activities like gardening, walking, or housework. Get 7-8 hours of sleep each night. If recommended by your health care provider, drink red wine in moderation. This means 1 glass a day for nonpregnant women and 2 glasses a day for men. A glass of wine equals 5 oz (150 mL). Reading food labels  Check the serving size of packaged foods. For foods such as rice and pasta, the serving size refers to the amount of cooked product, not dry. Check the total fat in packaged foods. Avoid foods that have saturated fat or trans fats. Check the ingredients list for added sugars, such as corn syrup. Shopping  At the grocery store, buy most of your food from the areas near the walls of the store. This includes: Fresh fruits and vegetables (produce). Grains, beans, nuts, and seeds. Some of these may be available in unpackaged forms or large amounts (in bulk). Fresh seafood. Poultry and eggs. Low-fat dairy products. Buy whole ingredients instead of prepackaged foods. Buy fresh fruits and vegetables in-season from local farmers markets. Buy frozen fruits and vegetables in resealable bags. If you do not have access to quality fresh seafood, buy precooked frozen shrimp or canned fish, such as tuna, salmon, or sardines.  Buy small amounts of raw or cooked vegetables, salads, or olives from the deli or salad bar at your store. Stock your pantry so you always have certain foods on hand, such as olive oil, canned tuna, canned tomatoes, rice, pasta, and beans. Cooking  Cook foods with extra-virgin olive oil instead of using butter or other vegetable oils. Have meat as a side dish, and have vegetables or grains as your main dish. This means having meat in small portions or adding small amounts of meat to foods like pasta or stew. Use beans or  vegetables instead of meat in common dishes like chili or lasagna. Experiment with different cooking methods. Try roasting or broiling vegetables instead of steaming or sauteing them. Add frozen vegetables to soups, stews, pasta, or rice. Add nuts or seeds for added healthy fat at each meal. You can add these to yogurt, salads, or vegetable dishes. Marinate fish or vegetables using olive oil, lemon juice, garlic, and fresh herbs. Meal planning  Plan to eat 1 vegetarian meal one day each week. Try to work up to 2 vegetarian meals, if possible. Eat seafood 2 or more times a week. Have healthy snacks readily available, such as: Vegetable sticks with hummus. Greek yogurt. Fruit and nut trail mix. Eat balanced meals throughout the week. This includes: Fruit: 2-3 servings a day Vegetables: 4-5 servings a day Low-fat dairy: 2 servings a day Fish, poultry, or lean meat: 1 serving a day Beans and legumes: 2 or more servings a week Nuts and seeds: 1-2 servings a day Whole grains: 6-8 servings a day Extra-virgin olive oil: 3-4 servings a day Limit red meat and sweets to only a few servings a month What are my food choices? Mediterranean diet Recommended Grains: Whole-grain pasta. Brown rice. Bulgar wheat. Polenta. Couscous. Whole-wheat bread. [place] Madeira. Vegetables: Artichokes. Beets. Broccoli. Cabbage. Carrots. Eggplant. Green beans. Chard. Kale. Spinach. Onions. Leeks. Peas. Squash. Tomatoes. Peppers. Radishes. Fruits: Apples. Apricots. Avocado. Berries. Bananas. Cherries. Dates. Figs. Grapes. Lemons. Melon. Oranges. Peaches. Plums. Pomegranate. Meats and other protein foods: Beans. Almonds. Sunflower seeds. Pine nuts. Peanuts. Cod. Salmon. Scallops. Shrimp. Tuna. Tilapia. Clams. Oysters. Eggs. Dairy: Low-fat milk. Cheese. Greek yogurt. Beverages: Water. Red wine. Herbal tea. Fats and oils: Extra virgin olive oil. Avocado oil. Grape seed oil. Sweets and desserts: Greek yogurt with honey.  Baked apples. Poached pears. Trail mix. Seasoning and other foods: Basil. Cilantro. Coriander. Cumin. Mint. Parsley. Sage. Rosemary. Tarragon. Garlic. Oregano. Thyme. Pepper. Balsalmic vinegar. Tahini. Hummus. Tomato sauce. Olives. Mushrooms. Limit these Grains: Prepackaged pasta or rice dishes. Prepackaged cereal with added sugar. Vegetables: Deep fried potatoes (french fries). Fruits: Fruit canned in syrup. Meats and other protein foods: Beef. Pork. Lamb. Poultry with skin. Hot dogs. [place]. Dairy: Ice cream. Sour cream. Whole milk. Beverages: Juice. Sugar-sweetened soft drinks. Beer. Liquor and spirits. Fats and oils: Butter. Canola oil. Vegetable oil. Beef fat (tallow). Lard. Sweets and desserts: Cookies. Cakes. Pies. Candy. Seasoning and other foods: Mayonnaise. Premade sauces and marinades. The items listed may not be a complete list. Talk with your dietitian about what dietary choices are right for you. Summary The Mediterranean diet includes both food and lifestyle choices. Eat a variety of fresh fruits and vegetables, beans, nuts, seeds, and whole grains. Limit the amount of red meat and sweets that you eat. Talk with your health care provider about whether it is safe for you to drink red wine in moderation. This means 1 glass a day for nonpregnant women and 2 glasses a  day for men. A glass of wine equals 5 oz (150 mL). This information is not intended to replace advice given to you by your health care provider. Make sure you discuss any questions you have with your health care provider. Document Released: 02/10/2016 Document Revised: 03/14/2016 Document Reviewed: 02/10/2016 Elsevier Interactive Patient Education  2017 [employer].

## 2024-07-16 NOTE — Progress Notes (Signed)
 Pt is aware of results.

## 2024-07-18 ENCOUNTER — Encounter: Payer: Self-pay | Admitting: Internal Medicine

## 2024-07-18 ENCOUNTER — Ambulatory Visit: Admitting: Internal Medicine

## 2024-07-18 ENCOUNTER — Ambulatory Visit

## 2024-07-18 VITALS — BP 130/80 | HR 65 | Temp 98.6°F | Ht 63.0 in | Wt 111.0 lb

## 2024-07-18 DIAGNOSIS — G8929 Other chronic pain: Secondary | ICD-10-CM

## 2024-07-18 DIAGNOSIS — M549 Dorsalgia, unspecified: Secondary | ICD-10-CM | POA: Diagnosis not present

## 2024-07-18 MED ORDER — CYCLOBENZAPRINE HCL 5 MG PO TABS: 5.0000 mg | ORAL_TABLET | Freq: Three times a day (TID) | ORAL | 1 refills | Status: AC | PRN

## 2024-07-18 NOTE — Patient Instructions (Addendum)
 We will check the x-rays today and get you in with the neurosurgeon to see what they can do long term to help this pain.  We have sent in cyclobenzaprine  to use as a muscle relaxer up to 3 times a day to help with the pain in the neck and back.

## 2024-07-18 NOTE — Progress Notes (Signed)
 "  Subjective:   Patient ID: Leslie Lane, female    DOB: 1956/07/02, 69 y.o.   MRN: [id number]  Discussed the use of AI scribe software for clinical note transcription with the patient, who gave verbal consent to proceed.  History of Present Illness Leslie Lane is a 69 year old female who presents with worsening back and arm pain.  She experiences significant back pain that has been worsening, described as sharp and severe, causing her to 'holler' in pain. The pain is located in the lower back and radiates throughout the entire back. She has been taking Tylenol  Arthritis, but it has not provided relief.  She also has pain in her right arm, particularly when moving it or applying lotion, accompanied by numbness. This pain is exacerbated by certain movements and activities, such as driving and showering, where she needs to hold on to avoid falling due to the pain.  Her medical history includes a fall or injury about a decade ago, which led to a CT scan revealing medium arthritis in the neck at C5 and C6, with potential nerve involvement causing numbness or pain.  She reports difficulty with daily activities due to pain, such as vacuuming, which she has been unable to do for three weeks due to lack of energy and pain. She also experienced a fall last night near the fireplace, which required her to use her arms to push herself up, leaving her exhausted.  She feels sleepy all the time, which she attributes to her blood sugar levels. She is also taking care of two dogs, which adds to her stress and physical demands.  Review of Systems  Constitutional: Negative.   HENT: Negative.    Eyes: Negative.   Respiratory:  Negative for cough, chest tightness and shortness of breath.   Cardiovascular:  Negative for chest pain, palpitations and leg swelling.  Gastrointestinal:  Negative for abdominal distention, abdominal pain, constipation, diarrhea, nausea and vomiting.  Musculoskeletal:   Positive for arthralgias and back pain.  Skin: Negative.   Neurological: Negative.   Psychiatric/Behavioral: Negative.      Objective:  Physical Exam Constitutional:      Appearance: She is well-developed.  HENT:     Head: Normocephalic and atraumatic.  Cardiovascular:     Rate and Rhythm: Normal rate and regular rhythm.  Pulmonary:     Effort: Pulmonary effort is normal. No respiratory distress.     Breath sounds: Normal breath sounds. No wheezing or rales.  Abdominal:     General: Bowel sounds are normal. There is no distension.     Palpations: Abdomen is soft.     Tenderness: There is no abdominal tenderness.  Musculoskeletal:        General: Tenderness present.     Cervical back: Normal range of motion.     Comments: Pain thoracic and cervical region over the spine to touch  Skin:    General: Skin is warm and dry.  Neurological:     Mental Status: She is alert and oriented to person, place, and time.     Coordination: Coordination normal.     Vitals:   07/18/24 1045  BP: 130/80  Pulse: 65  Temp: 98.6 F (37 C)  TempSrc: Oral  SpO2: 99%  Weight: 111 lb (50.3 kg)  Height: 5' 3 (1.6 m)   Assessment and Plan Assessment & Plan Chronic back pain with cervical and lumbar radiculopathy   Chronic back pain with cervical and lumbar radiculopathy is exacerbated  by movement. Imaging from 2016 revealed moderate cervical arthritis at C5-C6, indicating nerve compression. Tylenol  is ineffective. Ordered cervical and thoracic spine x-rays to assess for fractures or arthritis progression. Referred to a back specialist for evaluation and potential interventions, including physical therapy. Prescribed muscle relaxers for pain management, to be taken in the evening to assess for drowsiness. Referred to a neurosurgeon for further evaluation and management of cervical radiculopathy.   "

## 2024-07-22 ENCOUNTER — Encounter: Payer: Self-pay | Admitting: Endocrinology

## 2024-07-22 ENCOUNTER — Ambulatory Visit: Payer: Self-pay | Admitting: Endocrinology

## 2024-07-22 ENCOUNTER — Ambulatory Visit: Admitting: Endocrinology

## 2024-07-22 VITALS — BP 126/62 | HR 64 | Resp 16 | Ht 63.0 in | Wt 109.8 lb

## 2024-07-22 DIAGNOSIS — E108 Type 1 diabetes mellitus with unspecified complications: Secondary | ICD-10-CM

## 2024-07-22 DIAGNOSIS — T383X5A Adverse effect of insulin and oral hypoglycemic [antidiabetic] drugs, initial encounter: Secondary | ICD-10-CM

## 2024-07-22 DIAGNOSIS — E160 Drug-induced hypoglycemia without coma: Secondary | ICD-10-CM | POA: Diagnosis not present

## 2024-07-22 LAB — POCT GLYCOSYLATED HEMOGLOBIN (HGB A1C): Hemoglobin A1C: 6.5 % — AB (ref 4.0–5.6)

## 2024-07-22 MED ORDER — INSULIN LISPRO (1 UNIT DIAL) 100 UNIT/ML (KWIKPEN): PEN_INJECTOR | SUBCUTANEOUS | 4 refills | Status: AC

## 2024-07-22 MED ORDER — INSULIN GLARGINE 100 UNIT/ML SOLOSTAR PEN: PEN_INJECTOR | SUBCUTANEOUS | 4 refills | Status: AC

## 2024-07-22 NOTE — Progress Notes (Signed)
 "  Outpatient Endocrinology Note Zyree Traynham, MD  07/22/24  Patient's Name: Leslie Lane    DOB: 1956/01/22    MRN: [id number]                                                    REASON OF VISIT: Follow up of type 1 diabetes mellitus  PCP: Rollene Almarie LABOR, MD  HISTORY OF PRESENT ILLNESS:   LYRICA MCCLARTY is a 69 y.o. old female with past medical history listed below, is here for follow up for type 1 diabetes mellitus.   Interval history  CGM data as reviewed above.  Still with frequent hypoglycemia.  She has been taking occasionally Humalog  up to 4 units and Lantus  6 units 2 times a day.  She complains of burning of her feet otherwise no complaints today.  No other complaints today.  Current / Home Diabetic regimen includes:  Lantus  5-6 units in the morning and 5-6 units at bedtime.  Humalog  1 -3  units with breakfast and lunch and 1-3 units units with supper.  She takes variable doses.  Sometimes up to 4 units.  She is on Farxiga  10 mg daily by cardiology.  Glycemic data:  CONTINUOUS GLUCOSE MONITORING SYSTEM (CGMS) INTERPRETATION:   FreeStyle Libre 3+ CGM- has reader Sensor Download (Sensor download was reviewed and summarized below.) Dates: January 7 to July 22, 2024, 14 days   % data captured: 96%       Previous:     Impression: Still with frequent hypoglycemia with blood sugar 50 - 60 range at random times in between the meals and overnight.  Most of the other times acceptable blood sugar.  Occasional hyperglycemia with blood sugar up to 300 range especially at night related to his snacking and night eating.  Percentages of hypoglycemia has decreased compared to last visit.  Hypoglycemia: Patient has frequent hypoglycemic episodes. Patient has hypoglycemia ? awareness, has a glucagon  ER kit.  Glucose tablets.  Factors modifying glucose control: 1.  Diabetic diet assessment: likes to eat junk food.   2.  Staying active or exercising: No formal  exercise.  Physically active and walks multiple times a day.  3.  Medication compliance: compliant all of the time.  Pertinent Diabetes History: reviewed and updated  She was diagnosed with type 1 diabetes mellitus in 1967.  Prior diabetic medications:  Chronic Diabetes Complications : Retinopathy: no. Last ophthalmology exam was done on annually, following with ophthalmology regularly.  Nephropathy: no /microalbuminuria present Peripheral neuropathy: yes / burning, on gabapentin  as needed / lately not taking.  Cymbalta . Coronary artery disease: no Stroke: no She has gastroparesis. Followed by gastroenterologist for abdominal pain and pancreatic insufficiency.  Relevant comorbidities and cardiovascular risk factors: Obesity: no There is no height or weight on file to calculate BMI.  Hypertension: Yes  Hyperlipidemia : Yes, on statin   REVIEW OF SYSTEMS As per history of present illness.   PAST MEDICAL HISTORY: Past Medical History:  Diagnosis Date   Abdominal pain 02/28/2016   Abnormal CT scan, bladder    Noted 07/28/13 CT - s/p uro eval 08/2013 [place] -    Acquired trigger finger of left ring finger 02/18/2019   Injected February 18, 2019 and March 2022   Allergic rhinitis    Anemia    Arthritis    Bipolar  disorder 07/05/2010   Cataract    Chronic lower back pain 07/05/2010   Chronic neck pain    Diabetic gastroparesis associated with type 1 diabetes mellitus 10/24/2016   Fibromyalgia    Generalized anxiety disorder 04/27/2014   GERD (gastroesophageal reflux disease) 07/05/2010   Hepatitis B virus infection 07/05/2010   History of substance abuse    opiates; cocaine   Hypoglycemia unawareness associated with type 1 diabetes mellitus 11/23/2020   Insomnia 04/13/2015   Major depressive disorder 07/28/2014   Migraine headache 07/05/2010   Mild neurocognitive disorder due to multiple etiologies 03/15/2021   Mixed diabetic hyperlipidemia associated with type 1 diabetes  mellitus 07/05/2010   Neuropathy    Protein calorie malnutrition 06/01/2017   Right shoulder pain 02/18/2018   Seizures    pt states, if my blood sugar drops to the 20's, I convulse.  It hasn't happened in a long time.   Smokers' cough 07/05/2010   Tubular adenoma of colon 2018   Type 1 diabetes mellitus with complication 02/06/2013   WBC decreased 03/27/2017    PAST SURGICAL HISTORY: Past Surgical History:  Procedure Laterality Date   CESAREAN SECTION     COLONOSCOPY     TUBAL LIGATION     UPPER GASTROINTESTINAL ENDOSCOPY      ALLERGIES: Allergies  Allergen Reactions   Penicillins Anaphylaxis   Sulfonamide Derivatives Anaphylaxis    FAMILY HISTORY:  Family History  Problem Relation Age of Onset   Arthritis Mother    Diverticulosis Mother    Kidney disease Mother    Hyperlipidemia Mother    Arthritis Father    Kidney disease Father    Kidney cancer Father    Bladder Cancer Sister    Colon cancer Sister 47   Celiac disease Sister    Heart attack Brother    Kidney cancer Paternal Aunt    Diabetes Niece    Hyperlipidemia Other    Anxiety disorder Neg Hx    Bipolar disorder Neg Hx    Depression Neg Hx    Breast cancer Neg Hx    Esophageal cancer Neg Hx    Stomach cancer Neg Hx    Rectal cancer Neg Hx     SOCIAL HISTORY: Social History   Socioeconomic History   Marital status: Divorced    Spouse name: Not on file   Number of children: 1   Years of education: 12   Highest education level: High school graduate  Occupational History   Occupation: Disability  Tobacco Use   Smoking status: Every Day    Current packs/day: 1.50    Average packs/day: 1.5 packs/day for 50.0 years (75.0 ttl pk-yrs)    Types: Cigarettes   Smokeless tobacco: Never   Tobacco comments:    Reports she has cut back to a cartoon that now lasts 2 weeks.   Vaping Use   Vaping status: Former  Substance and Sexual Activity   Alcohol use: Yes    Alcohol/week: 7.0 standard drinks of  alcohol    Types: 7 Cans of beer per week    Comment: 1 beer nightly   Drug use: Not Currently    Types: Cocaine    Comment: past opiate and cocaine abuse   Sexual activity: Not Currently    Birth control/protection: Surgical, Post-menopausal  Other Topics Concern   Not on file  Social History Narrative   Divorced, disabled   Lives with parents   Right handed    Retired   Caffeine prn  Social Drivers of Health   Tobacco Use: High Risk (07/22/2024)   Patient History    Smoking Tobacco Use: Every Day    Smokeless Tobacco Use: Never    Passive Exposure: Not on file  Financial Resource Strain: Low Risk (03/14/2022)   Overall Financial Resource Strain (CARDIA)    Difficulty of Paying Living Expenses: Not hard at all  Food Insecurity: No Food Insecurity (07/01/2024)   Epic    Worried About Programme Researcher, Broadcasting/film/video in the Last Year: Never true    Ran Out of Food in the Last Year: Never true  Transportation Needs: No Transportation Needs (07/01/2024)   Epic    Lack of Transportation (Medical): No    Lack of Transportation (Non-Medical): No  Physical Activity: Inactive (07/01/2024)   Exercise Vital Sign    Days of Exercise per Week: 0 days    Minutes of Exercise per Session: 0 min  Stress: Stress Concern Present (07/01/2024)   [employer] of Occupational Health - Occupational Stress Questionnaire    Feeling of Stress: To some extent  Social Connections: Moderately Integrated (07/01/2024)   Social Connection and Isolation Panel    Frequency of Communication with Friends and Family: More than three times a week    Frequency of Social Gatherings with Friends and Family: More than three times a week    Attends Religious Services: More than 4 times per year    Active Member of Clubs or Organizations: Yes    Attends Banker Meetings: More than 4 times per year    Marital Status: Divorced  Depression (PHQ2-9): High Risk (07/01/2024)   Depression (PHQ2-9)    PHQ-2  Score: 15  Alcohol Screen: Low Risk (03/14/2022)   Alcohol Screen    Last Alcohol Screening Score (AUDIT): 4  Housing: Unknown (07/01/2024)   Epic    Unable to Pay for Housing in the Last Year: No    Number of Times Moved in the Last Year: Not on file    Homeless in the Last Year: No  Utilities: Not At Risk (07/01/2024)   Epic    Threatened with loss of utilities: No  Health Literacy: Adequate Health Literacy (07/01/2024)   B1300 Health Literacy    Frequency of need for help with medical instructions: Never    MEDICATIONS:  Current Outpatient Medications  Medication Sig Dispense Refill   Accu-Chek Softclix Lancets lancets Use as instructed to check blood sugar 4 times per day dx code E10.65 400 each 1   acetaminophen -codeine  (TYLENOL  #3) 300-30 MG tablet Take 1 tablet by mouth every 6 (six) hours as needed. 20 tablet 0   Alcohol Swabs (B-D SINGLE USE SWABS REGULAR) PADS USE FOUR TIMES DAILY 400 each 2   aspirin EC 81 MG tablet Take 81 mg by mouth every morning.     B-D ULTRAFINE III SHORT PEN 31G X 8 MM MISC USE TO INJECT WITH PRE-FILLED PEN SYRINGE SUB-Q DAILY 400 each 2   Blood Glucose Calibration (ACCU-CHEK AVIVA) SOLN USE AS DIRECTED 1 each 1   Blood Glucose Monitoring Suppl (ACCU-CHEK AVIVA PLUS) w/Device KIT USE AS DIRECTED 1 kit 0   cetirizine  (ZYRTEC ) 10 MG tablet Take 1 tablet (10 mg total) by mouth daily. 90 tablet 3   Clobetasol Propionate 0.05 % shampoo Apply topically.     Continuous Glucose Receiver (FREESTYLE LIBRE 3 READER) DEVI 1 Device by Does not apply route continuous. 1 each 0   Continuous Glucose Sensor (FREESTYLE LIBRE 3 PLUS SENSOR)  MISC 1 each by Does not apply route continuous. Change every 15 days. 6 each 3   cyclobenzaprine  (FLEXERIL ) 5 MG tablet Take 1 tablet (5 mg total) by mouth 3 (three) times daily as needed for muscle spasms. 30 tablet 1   dapagliflozin  propanediol (FARXIGA ) 10 MG TABS tablet Take 1 tablet (10 mg total) by mouth daily. 30 tablet 11    donepezil  (ARICEPT ) 10 MG tablet Take 1 tablet (10 mg total) by mouth daily. 60 tablet 5   ezetimibe  (ZETIA ) 10 MG tablet Take 1 tablet (10 mg total) by mouth daily. 90 tablet 3   fexofenadine (ALLEGRA) 180 MG tablet Take 180 mg by mouth daily.     Glucagon  (GVOKE HYPOPEN  2-PACK) 1 MG/0.2ML SOAJ INJECT 1 MG SUBCUTANEOUS AS NEEDED FOR LOW BLOOD SUGAR 0.4 mL 2   glucose blood (ACCU-CHEK AVIVA PLUS) test strip CHECK BLOOD SUGAR 4 TIMES DAILY 400 strip 3   hyoscyamine  (LEVBID ) 0.375 MG 12 hr tablet Take 1 tablet (0.375 mg total) by mouth 2 (two) times daily. 60 tablet 11   Insulin  Pen Needle 32G X 4 MM MISC Use inject insulin  tid 300 each 2   Insulin  Pen Needle 32G X 4 MM MISC Use 4x a day 300 each 3   Insulin  Syringe-Needle U-100 (B-D INSULIN  SYRINGE) 31G X 5/16 0.3 ML MISC Use to administer insulin  10 each 2   lamoTRIgine  (LAMICTAL ) 100 MG tablet Take 1 tablet (100 mg total) by mouth 2 (two) times daily. 60 tablet 2   Lancets MISC Use to check blood sugar once a day 100 each 3   lipase/protease/amylase (CREON ) 36000 UNITS CPEP capsule Take 2 capsules by mouth three times a day with meals and 1 capsule by mouth twice daily with snacks 240 capsule 11   Magnesium  500 MG TABS Take 1 tablet by mouth daily.     memantine  (NAMENDA ) 5 MG tablet Take 1 tablet (5 mg at night) for 2 weeks, then increase to 1 tablet (5 mg) twice a day 60 tablet 11   metoprolol  succinate (TOPROL -XL) 25 MG 24 hr tablet TAKE 1 TABLET(25 MG) BY MOUTH DAILY 90 tablet 3   Multiple Vitamin (MULTIVITAMIN) tablet Take 1 tablet by mouth daily. Reported on 11/22/2015     Omega-3 Fatty Acids (FISH OIL) 1000 MG CAPS Take 1 capsule by mouth daily.     omeprazole  (PRILOSEC) 40 MG capsule TAKE 1 CAPSULE(40 MG) BY MOUTH IN THE MORNING AND AT BEDTIME 180 capsule 0   rosuvastatin  (CRESTOR ) 40 MG tablet Take 1 tablet (40 mg total) by mouth daily. 90 tablet 1   traZODone  (DESYREL ) 150 MG tablet Take 1 tablet (150 mg total) by mouth at bedtime. 30  tablet 2   triamcinolone  cream (KENALOG) 0.1 %      umeclidinium-vilanterol (ANORO ELLIPTA ) 62.5-25 MCG/ACT AEPB Inhale 1 puff into the lungs daily. 60 each 12   vitamin E 1000 UNIT capsule Take 1,000 Units by mouth daily.     insulin  glargine (LANTUS ) 100 UNIT/ML Solostar Pen Take 4 units in the morning and 4 units at bedtime. 15 mL 4   insulin  lispro (HUMALOG  KWIKPEN) 100 UNIT/ML KwikPen INJECT SUBCUTANEOUSLY 0 -3  UNITS 3 TIMES DAILY AS INSTRUCTED MAXIMUM 9 UNITS A DAY. 15 mL 4   No current facility-administered medications for this visit.    PHYSICAL EXAM: Vitals:   07/22/24 1106  BP: 126/62  Pulse: 64  Resp: 16  SpO2: 98%  Weight: 109 lb 12.8 oz (49.8 kg)  Height: 5' 3 (1.6 m)        Body mass index is 19.45 kg/m.  Wt Readings from Last 3 Encounters:  07/22/24 109 lb 12.8 oz (49.8 kg)  07/18/24 111 lb (50.3 kg)  07/11/24 110 lb (49.9 kg)    General: Well developed, well nourished female in no apparent distress.  HEENT: AT/[place], no external lesions.  Eyes: Conjunctiva clear and no icterus. Neck: Neck supple  Lungs: Respirations not labored Neurologic: Alert, oriented, normal speech Extremities / Skin: Dry.   Psychiatric: Does not appear depressed or anxious  Diabetic Foot Exam - Simple   Simple Foot Form Diabetic Foot exam was performed with the following findings: Yes 07/22/2024 11:17 AM  Visual Inspection No deformities, no ulcerations, no other skin breakdown bilaterally: Yes Sensation Testing Intact to touch and monofilament testing bilaterally: Yes Pulse Check Posterior Tibialis and Dorsalis pulse intact bilaterally: Yes Comments    LABS Reviewed Lab Results  Component Value Date   HGBA1C 6.5 (A) 07/22/2024   HGBA1C 6.1 (A) 04/21/2024   HGBA1C 6.2 (A) 12/24/2023   Lab Results  Component Value Date   FRUCTOSAMINE 325 (H) 02/28/2021   FRUCTOSAMINE 322 (H) 10/11/2017   FRUCTOSAMINE 296 (H) 01/11/2016   Lab Results  Component Value Date   CHOL  124 07/01/2024   HDL 81.10 07/01/2024   LDLCALC 28 07/01/2024   LDLDIRECT 87 12/21/2023   TRIG 72.0 07/01/2024   CHOLHDL 2 07/01/2024   Lab Results  Component Value Date   MICRALBCREAT Unable to calculate 12/31/2023   Lab Results  Component Value Date   CREATININE 0.79 07/01/2024   Lab Results  Component Value Date   GFR 75.91 07/01/2024    ASSESSMENT / PLAN  1. Type 1 diabetes mellitus with complications (HCC)   2. Hypoglycemia due to insulin      Diabetes Mellitus type 1, complicated by diabetic neuropathy/gastroparesis. - Diabetic status / severity: Uncontrolled.  Frequent hypoglycemia.  Lab Results  Component Value Date   HGBA1C 6.5 (A) 07/22/2024    - Hemoglobin A1c goal : <7%  Patient has labile blood sugar with hypo and hyperglycemia.  She is ? not a good candidate for insulin  pump as she has difficulty with Dexcom sensor and likely will have ? difficulty learning insulin  pump operation.  Again discussed about insulin  pump therapy, patient wants to stay on the multidose insulin  regimen.  Discussed that hypoglycemia can be detrimental to the health including life-threatening.  Discussed that it is okay to have permissive mild hyperglycemia rather than hypoglycemia.  - Medications: See below.  Decrease insulin  as follows.  I) decrease from Lantus  to 6 units 2 times per day to 4 units in the morning and at bedtime.  II) decrease Humalog  1-3 units with meals 3 times a day,  to 0-3 units if you exercise do not take take humalog  for the meal as well. No more than 3 units at a time.   III) She is on Farxiga  10 mg daily by cardiology.  Asked to call our clinic if she continues to have low blood sugar, need to adjust insulin  regimen at that time.  Do not take humalog  around bedtime for glucose 250 or less.   - Home glucose testing: continue freestyle libre 3+ CGM check blood sugar as needed.    - Discussed/ Gave Hypoglycemia treatment plan.  She uses glucose tablet  and juice to correct hypoglycemia.  She has Glucagon  Emergency Kit at home.  # Consult : not required at  this time.   # Annual urine for microalbuminuria/ creatinine ratio, no microalbuminuria currently. Last  Lab Results  Component Value Date   MICRALBCREAT Unable to calculate 12/31/2023    # Foot check nightly / neuropathy, continue gabapentin  as needed, currently not taking.  Consider caspacian cream over-the-counter.  # Annual dilated diabetic eye exams.   - Diet: Eat reasonable portion sizes to promote a healthy weight.  Advised to avoid frequently snacking, especially at night. - Life style / activity / exercise: Discussed.  2. Blood pressure  -  BP Readings from Last 1 Encounters:  07/22/24 126/62    - Control is in target.  - No change in current plans.  3. Lipid status / Hyperlipidemia - Last  Lab Results  Component Value Date   LDLCALC 28 07/01/2024   - Continue lovastatin  40 mg daily.  Managed by PCP.  Diagnoses and all orders for this visit:  Type 1 diabetes mellitus with complications (HCC) -     POCT glycosylated hemoglobin (Hb A1C) -     insulin  glargine (LANTUS ) 100 UNIT/ML Solostar Pen; Take 4 units in the morning and 4 units at bedtime. -     insulin  lispro (HUMALOG  KWIKPEN) 100 UNIT/ML KwikPen; INJECT SUBCUTANEOUSLY 0 -3  UNITS 3 TIMES DAILY AS INSTRUCTED MAXIMUM 9 UNITS A DAY.  Hypoglycemia due to insulin      DISPOSITION Follow up in clinic in 3 months suggested.   All questions answered and patient verbalized understanding of the plan.  Delyla Sandeen, MD [facility] Endocrinology [facility] [address] [place], [address] [place], [place] [address] Phone # [phone]  At least part of this note was generated using voice recognition software. Inadvertent word errors may have occurred, which were not recognized during the proofreading process. "

## 2024-07-22 NOTE — Patient Instructions (Signed)
 Decrease Lantus  4 units in the morning and 4 units at bedtime.   Humalog  0-3 units with meals 3 times a day, if you exercise do not take take humalog  for the meal as well. No more than 3 units at a time.   Do not take humalog  around bedtime for glucose 250 or less.   Use Free style libre CGM monitor.   You can try Capsician cream at night for neuropathy.

## 2024-07-25 ENCOUNTER — Ambulatory Visit: Payer: Self-pay | Admitting: Internal Medicine

## 2024-07-29 ENCOUNTER — Encounter

## 2024-07-31 ENCOUNTER — Telehealth: Payer: Self-pay

## 2024-07-31 DIAGNOSIS — E1065 Type 1 diabetes mellitus with hyperglycemia: Secondary | ICD-10-CM

## 2024-07-31 MED ORDER — FREESTYLE LIBRE 3 READER DEVI: 1.0000 | 0 refills | Status: AC

## 2024-07-31 NOTE — Telephone Encounter (Signed)
 Patient called stating she lost her CGM receiver and requested another be sent to pharmacy. Refill request complete. Patient called and made aware.

## 2024-08-06 ENCOUNTER — Other Ambulatory Visit: Payer: Self-pay | Admitting: Internal Medicine

## 2024-08-06 ENCOUNTER — Telehealth: Payer: Self-pay | Admitting: Physician Assistant

## 2024-08-06 ENCOUNTER — Encounter: Payer: Self-pay | Admitting: Physician Assistant

## 2024-08-06 NOTE — Telephone Encounter (Signed)
 Cyndia came into the office wanting to know if she could get a letter to excuse her from [employer]. She has [employer] on 09/18/2024.    PH: [phone]

## 2024-08-20 ENCOUNTER — Encounter

## 2024-09-11 ENCOUNTER — Ambulatory Visit (HOSPITAL_COMMUNITY): Admitting: Psychiatry

## 2024-10-16 ENCOUNTER — Ambulatory Visit: Admitting: Endocrinology

## 2025-01-05 ENCOUNTER — Encounter: Admitting: Internal Medicine

## 2025-01-12 ENCOUNTER — Ambulatory Visit: Payer: Self-pay | Admitting: Physician Assistant

## 2025-07-06 ENCOUNTER — Ambulatory Visit
